# Patient Record
Sex: Female | Born: 1935 | Race: White | Hispanic: No | Smoking: Former smoker
Health system: Southern US, Community
[De-identification: ages and names within clinical notes are randomized; demographics above are authoritative.]

## PROBLEM LIST (undated history)

## (undated) ENCOUNTER — Emergency Department (HOSPITAL_COMMUNITY): Disposition: A | Discharge status: Home / Self Care | Payer: Medicare Other

## (undated) DIAGNOSIS — K5790 Diverticulosis of intestine, part unspecified, without perforation or abscess without bleeding: Secondary | ICD-10-CM

## (undated) DIAGNOSIS — I5042 Chronic combined systolic (congestive) and diastolic (congestive) heart failure: Secondary | ICD-10-CM

## (undated) DIAGNOSIS — N200 Calculus of kidney: Secondary | ICD-10-CM

## (undated) DIAGNOSIS — J449 Chronic obstructive pulmonary disease, unspecified: Secondary | ICD-10-CM

## (undated) DIAGNOSIS — I2510 Atherosclerotic heart disease of native coronary artery without angina pectoris: Secondary | ICD-10-CM

## (undated) DIAGNOSIS — L989 Disorder of the skin and subcutaneous tissue, unspecified: Secondary | ICD-10-CM

## (undated) DIAGNOSIS — E785 Hyperlipidemia, unspecified: Secondary | ICD-10-CM

## (undated) DIAGNOSIS — D510 Vitamin B12 deficiency anemia due to intrinsic factor deficiency: Secondary | ICD-10-CM

## (undated) DIAGNOSIS — Z9981 Dependence on supplemental oxygen: Secondary | ICD-10-CM

## (undated) DIAGNOSIS — I350 Nonrheumatic aortic (valve) stenosis: Secondary | ICD-10-CM

## (undated) DIAGNOSIS — Z860100 Personal history of colon polyps, unspecified: Secondary | ICD-10-CM

## (undated) DIAGNOSIS — E539 Vitamin B deficiency, unspecified: Secondary | ICD-10-CM

## (undated) DIAGNOSIS — G8929 Other chronic pain: Secondary | ICD-10-CM

## (undated) DIAGNOSIS — T8859XA Other complications of anesthesia, initial encounter: Secondary | ICD-10-CM

## (undated) DIAGNOSIS — M419 Scoliosis, unspecified: Secondary | ICD-10-CM

## (undated) DIAGNOSIS — Z8719 Personal history of other diseases of the digestive system: Secondary | ICD-10-CM

## (undated) DIAGNOSIS — J189 Pneumonia, unspecified organism: Secondary | ICD-10-CM

## (undated) DIAGNOSIS — Z9289 Personal history of other medical treatment: Secondary | ICD-10-CM

## (undated) DIAGNOSIS — R42 Dizziness and giddiness: Secondary | ICD-10-CM

## (undated) DIAGNOSIS — J42 Unspecified chronic bronchitis: Secondary | ICD-10-CM

## (undated) DIAGNOSIS — K219 Gastro-esophageal reflux disease without esophagitis: Secondary | ICD-10-CM

## (undated) DIAGNOSIS — L409 Psoriasis, unspecified: Secondary | ICD-10-CM

## (undated) DIAGNOSIS — M549 Dorsalgia, unspecified: Secondary | ICD-10-CM

## (undated) DIAGNOSIS — T4145XA Adverse effect of unspecified anesthetic, initial encounter: Secondary | ICD-10-CM

## (undated) DIAGNOSIS — C160 Malignant neoplasm of cardia: Secondary | ICD-10-CM

## (undated) DIAGNOSIS — K274 Chronic or unspecified peptic ulcer, site unspecified, with hemorrhage: Secondary | ICD-10-CM

## (undated) DIAGNOSIS — D519 Vitamin B12 deficiency anemia, unspecified: Secondary | ICD-10-CM

## (undated) DIAGNOSIS — M797 Fibromyalgia: Secondary | ICD-10-CM

## (undated) DIAGNOSIS — M1990 Unspecified osteoarthritis, unspecified site: Secondary | ICD-10-CM

## (undated) DIAGNOSIS — I10 Essential (primary) hypertension: Secondary | ICD-10-CM

## (undated) DIAGNOSIS — I272 Pulmonary hypertension, unspecified: Secondary | ICD-10-CM

## (undated) DIAGNOSIS — S42302A Unspecified fracture of shaft of humerus, left arm, initial encounter for closed fracture: Secondary | ICD-10-CM

## (undated) DIAGNOSIS — D509 Iron deficiency anemia, unspecified: Secondary | ICD-10-CM

## (undated) DIAGNOSIS — B029 Zoster without complications: Secondary | ICD-10-CM

## (undated) HISTORY — PX: CATARACT EXTRACTION W/ INTRAOCULAR LENS  IMPLANT, BILATERAL: SHX1307

## (undated) HISTORY — DX: Fibromyalgia: M79.7

## (undated) HISTORY — DX: Chronic or unspecified peptic ulcer, site unspecified, with hemorrhage: K27.4

## (undated) HISTORY — PX: TONSILLECTOMY: SUR1361

## (undated) HISTORY — DX: Disorder of the skin and subcutaneous tissue, unspecified: L98.9

## (undated) HISTORY — DX: Atherosclerotic heart disease of native coronary artery without angina pectoris: I25.10

## (undated) HISTORY — DX: Chronic combined systolic (congestive) and diastolic (congestive) heart failure: I50.42

## (undated) HISTORY — PX: ESOPHAGOGASTRODUODENOSCOPY: SHX1529

## (undated) HISTORY — DX: Vitamin B12 deficiency anemia due to intrinsic factor deficiency: D51.0

## (undated) HISTORY — PX: BACK SURGERY: SHX140

## (undated) HISTORY — DX: Pulmonary hypertension, unspecified: I27.20

## (undated) HISTORY — DX: Hyperlipidemia, unspecified: E78.5

## (undated) HISTORY — PX: FIXATION KYPHOPLASTY THORACIC SPINE: SHX1643

## (undated) HISTORY — DX: Essential (primary) hypertension: I10

## (undated) HISTORY — PX: JOINT REPLACEMENT: SHX530

## (undated) HISTORY — DX: Nonrheumatic aortic (valve) stenosis: I35.0

## (undated) HISTORY — DX: Malignant neoplasm of cardia: C16.0

## (undated) HISTORY — PX: COLONOSCOPY: SHX174

## (undated) HISTORY — DX: Unspecified osteoarthritis, unspecified site: M19.90

## (undated) HISTORY — PX: DILATION AND CURETTAGE OF UTERUS: SHX78

---

## 1973-12-06 HISTORY — PX: APPENDECTOMY: SHX54

## 1973-12-06 HISTORY — PX: TUBAL LIGATION: SHX77

## 1999-02-26 ENCOUNTER — Other Ambulatory Visit: Admission: RE | Admit: 1999-02-26 | Discharge: 1999-02-26 | Payer: Self-pay | Admitting: Obstetrics and Gynecology

## 1999-12-07 HISTORY — PX: TOTAL HIP ARTHROPLASTY: SHX124

## 2002-04-11 ENCOUNTER — Ambulatory Visit (HOSPITAL_COMMUNITY): Admission: RE | Admit: 2002-04-11 | Discharge: 2002-04-11 | Payer: Self-pay | Admitting: Gastroenterology

## 2002-05-23 ENCOUNTER — Encounter (HOSPITAL_COMMUNITY): Admission: RE | Admit: 2002-05-23 | Discharge: 2002-08-21 | Payer: Self-pay | Admitting: Family Medicine

## 2005-03-26 ENCOUNTER — Ambulatory Visit (HOSPITAL_COMMUNITY): Admission: RE | Admit: 2005-03-26 | Discharge: 2005-03-26 | Payer: Self-pay | Admitting: Gastroenterology

## 2005-07-06 ENCOUNTER — Encounter (HOSPITAL_COMMUNITY): Admission: RE | Admit: 2005-07-06 | Discharge: 2005-10-04 | Payer: Self-pay | Admitting: Family Medicine

## 2005-09-28 ENCOUNTER — Ambulatory Visit: Payer: Self-pay | Admitting: Hematology and Oncology

## 2005-10-08 ENCOUNTER — Other Ambulatory Visit: Admission: RE | Admit: 2005-10-08 | Discharge: 2005-10-08 | Payer: Self-pay | Admitting: Hematology and Oncology

## 2005-10-08 ENCOUNTER — Encounter (INDEPENDENT_AMBULATORY_CARE_PROVIDER_SITE_OTHER): Payer: Self-pay | Admitting: *Deleted

## 2005-10-08 ENCOUNTER — Encounter: Payer: Self-pay | Admitting: Hematology and Oncology

## 2005-12-17 ENCOUNTER — Encounter: Admission: RE | Admit: 2005-12-17 | Discharge: 2005-12-17 | Payer: Self-pay | Admitting: Family Medicine

## 2005-12-20 ENCOUNTER — Emergency Department (HOSPITAL_COMMUNITY): Admission: EM | Admit: 2005-12-20 | Discharge: 2005-12-21 | Payer: Self-pay | Admitting: Emergency Medicine

## 2006-06-01 ENCOUNTER — Ambulatory Visit: Payer: Self-pay | Admitting: Hematology and Oncology

## 2006-06-01 LAB — CBC WITH DIFFERENTIAL/PLATELET
BASO%: 0.8 % (ref 0.0–2.0)
EOS%: 1 % (ref 0.0–7.0)
HCT: 25.3 % — ABNORMAL LOW (ref 34.8–46.6)
LYMPH%: 19.3 % (ref 14.0–48.0)
MCH: 26.3 pg (ref 26.0–34.0)
MCHC: 32.4 g/dL (ref 32.0–36.0)
MONO%: 10.4 % (ref 0.0–13.0)
NEUT%: 68.5 % (ref 39.6–76.8)
Platelets: 741 10*3/uL — ABNORMAL HIGH (ref 145–400)

## 2006-06-06 LAB — IRON AND TIBC
%SAT: 12 % — ABNORMAL LOW (ref 20–55)
Iron: 30 ug/dL — ABNORMAL LOW (ref 42–145)
TIBC: 253 ug/dL (ref 250–470)

## 2006-06-06 LAB — VON WILLEBRAND FACTOR MULTIMER

## 2006-06-06 LAB — COMPREHENSIVE METABOLIC PANEL
ALT: <8 U/L (ref 0–40)
AST: 11 U/L (ref 0–37)
Creatinine, Ser: 1.21 mg/dL — ABNORMAL HIGH (ref 0.40–1.20)
Total Bilirubin: 0.3 mg/dL (ref 0.3–1.2)

## 2006-06-13 ENCOUNTER — Encounter (HOSPITAL_COMMUNITY): Admission: RE | Admit: 2006-06-13 | Discharge: 2006-09-11 | Payer: Self-pay | Admitting: Hematology and Oncology

## 2006-07-05 LAB — COMPREHENSIVE METABOLIC PANEL
AST: 11 U/L (ref 0–37)
BUN: 19 mg/dL (ref 6–23)
CO2: 24 mEq/L (ref 19–32)
Calcium: 9.6 mg/dL (ref 8.4–10.5)
Chloride: 108 mEq/L (ref 96–112)
Creatinine, Ser: 1.05 mg/dL (ref 0.40–1.20)
Total Bilirubin: 0.3 mg/dL (ref 0.3–1.2)

## 2006-07-05 LAB — CBC WITH DIFFERENTIAL/PLATELET
Basophils Absolute: 0.1 10*3/uL (ref 0.0–0.1)
EOS%: 2.9 % (ref 0.0–7.0)
HCT: 27 % — ABNORMAL LOW (ref 34.8–46.6)
HGB: 9 g/dL — ABNORMAL LOW (ref 11.6–15.9)
LYMPH%: 24.6 % (ref 14.0–48.0)
MCH: 27.8 pg (ref 26.0–34.0)
NEUT%: 61.1 % (ref 39.6–76.8)
Platelets: 448 10*3/uL — ABNORMAL HIGH (ref 145–400)
lymph#: 2 10*3/uL (ref 0.9–3.3)

## 2006-07-05 LAB — IRON AND TIBC
%SAT: 22 % (ref 20–55)
Iron: 54 ug/dL (ref 42–145)
TIBC: 241 ug/dL — ABNORMAL LOW (ref 250–470)

## 2006-07-07 LAB — TYPE & CROSSMATCH - CHCC

## 2006-08-09 ENCOUNTER — Ambulatory Visit: Payer: Self-pay | Admitting: Hematology and Oncology

## 2006-08-11 LAB — CBC WITH DIFFERENTIAL/PLATELET
Eosinophils Absolute: 0.2 10*3/uL (ref 0.0–0.5)
HGB: 10.5 g/dL — ABNORMAL LOW (ref 11.6–15.9)
MONO#: 0.7 10*3/uL (ref 0.1–0.9)
NEUT#: 5.7 10*3/uL (ref 1.5–6.5)
RBC: 3.66 10*6/uL — ABNORMAL LOW (ref 3.70–5.32)
RDW: 20.1 % — ABNORMAL HIGH (ref 11.3–14.5)
WBC: 8.3 10*3/uL (ref 3.9–10.0)

## 2006-08-11 LAB — HOLD TUBE, BLOOD BANK

## 2006-08-16 LAB — CBC WITH DIFFERENTIAL/PLATELET
BASO%: 0.8 % (ref 0.0–2.0)
Basophils Absolute: 0.1 10*3/uL (ref 0.0–0.1)
EOS%: 3 % (ref 0.0–7.0)
Eosinophils Absolute: 0.2 10*3/uL (ref 0.0–0.5)
HCT: 30.4 % — ABNORMAL LOW (ref 34.8–46.6)
HGB: 10.1 g/dL — ABNORMAL LOW (ref 11.6–15.9)
LYMPH%: 21.4 % (ref 14.0–48.0)
MCH: 28.3 pg (ref 26.0–34.0)
MCHC: 33.4 g/dL (ref 32.0–36.0)
MCV: 84.9 fL (ref 81.0–101.0)
MONO#: 0.9 10*3/uL (ref 0.1–0.9)
MONO%: 11.3 % (ref 0.0–13.0)
NEUT#: 4.9 10*3/uL (ref 1.5–6.5)
NEUT%: 63.5 % (ref 39.6–76.8)
Platelets: 435 10*3/uL — ABNORMAL HIGH (ref 145–400)
RBC: 3.58 10*6/uL — ABNORMAL LOW (ref 3.70–5.32)
RDW: 19 % — ABNORMAL HIGH (ref 11.3–14.5)
WBC: 7.7 10*3/uL (ref 3.9–10.0)
lymph#: 1.6 10*3/uL (ref 0.9–3.3)

## 2006-08-16 LAB — COMPREHENSIVE METABOLIC PANEL WITH GFR
ALT: 8 U/L (ref 0–40)
AST: 9 U/L (ref 0–37)
Albumin: 4 g/dL (ref 3.5–5.2)
Alkaline Phosphatase: 51 U/L (ref 39–117)
BUN: 23 mg/dL (ref 6–23)
CO2: 26 meq/L (ref 19–32)
Calcium: 10.3 mg/dL (ref 8.4–10.5)
Chloride: 107 meq/L (ref 96–112)
Creatinine, Ser: 1.35 mg/dL — ABNORMAL HIGH (ref 0.40–1.20)
Glucose, Bld: 97 mg/dL (ref 70–99)
Potassium: 4.2 meq/L (ref 3.5–5.3)
Sodium: 139 meq/L (ref 135–145)
Total Bilirubin: 0.2 mg/dL — ABNORMAL LOW (ref 0.3–1.2)
Total Protein: 7 g/dL (ref 6.0–8.3)

## 2006-08-16 LAB — HAPTOGLOBIN: Haptoglobin: 225 mg/dL — ABNORMAL HIGH (ref 16–200)

## 2006-08-16 LAB — HOLD TUBE, BLOOD BANK

## 2006-09-16 ENCOUNTER — Encounter: Admission: RE | Admit: 2006-09-16 | Discharge: 2006-09-16 | Payer: Self-pay | Admitting: Family Medicine

## 2006-09-19 ENCOUNTER — Observation Stay (HOSPITAL_COMMUNITY): Admission: AD | Admit: 2006-09-19 | Discharge: 2006-09-21 | Payer: Self-pay | Admitting: Internal Medicine

## 2006-09-20 ENCOUNTER — Ambulatory Visit: Payer: Self-pay | Admitting: Internal Medicine

## 2007-06-14 ENCOUNTER — Encounter (HOSPITAL_COMMUNITY): Admission: RE | Admit: 2007-06-14 | Discharge: 2007-08-10 | Payer: Self-pay | Admitting: Family Medicine

## 2007-07-04 ENCOUNTER — Encounter (HOSPITAL_COMMUNITY): Admission: RE | Admit: 2007-07-04 | Discharge: 2007-09-04 | Payer: Self-pay | Admitting: Family Medicine

## 2007-07-05 ENCOUNTER — Ambulatory Visit (HOSPITAL_COMMUNITY): Admission: RE | Admit: 2007-07-05 | Discharge: 2007-07-05 | Payer: Self-pay | Admitting: Gastroenterology

## 2008-02-29 ENCOUNTER — Encounter (HOSPITAL_COMMUNITY): Admission: RE | Admit: 2008-02-29 | Discharge: 2008-05-29 | Payer: Self-pay | Admitting: Family Medicine

## 2008-06-27 ENCOUNTER — Encounter (HOSPITAL_COMMUNITY): Admission: RE | Admit: 2008-06-27 | Discharge: 2008-08-15 | Payer: Self-pay | Admitting: Family Medicine

## 2008-07-31 ENCOUNTER — Ambulatory Visit: Payer: Self-pay | Admitting: Hematology and Oncology

## 2008-08-02 LAB — CBC & DIFF AND RETIC
Basophils Absolute: 0.1 10*3/uL (ref 0.0–0.1)
Eosinophils Absolute: 0.1 10*3/uL (ref 0.0–0.5)
HGB: 9.2 g/dL — ABNORMAL LOW (ref 11.6–15.9)
IRF: 0.5 — ABNORMAL HIGH (ref 0.130–0.330)
MONO#: 0.9 10*3/uL (ref 0.1–0.9)
NEUT#: 5.8 10*3/uL (ref 1.5–6.5)
RBC: 3.16 10*6/uL — ABNORMAL LOW (ref 3.70–5.32)
RDW: 17.8 % — ABNORMAL HIGH (ref 11.3–14.5)
RETIC #: 120.4 10*3/uL — ABNORMAL HIGH (ref 19.7–115.1)
Retic %: 3.8 % — ABNORMAL HIGH (ref 0.4–2.3)
WBC: 8.8 10*3/uL (ref 3.9–10.0)

## 2008-08-02 LAB — URINALYSIS, MICROSCOPIC - CHCC
Bilirubin (Urine): NEGATIVE
Ketones: NEGATIVE mg/dL
Protein: NEGATIVE mg/dL
RBC count: NEGATIVE (ref 0–2)
Specific Gravity, Urine: 1.005 (ref 1.003–1.035)
WBC, UA: NEGATIVE (ref 0–2)
pH: 6 (ref 4.6–8.0)

## 2008-08-06 LAB — GLUCOSE 6 PHOSPHATE DEHYDROGENASE: G-6-PD, Quant: 22 U/g{Hb} — ABNORMAL HIGH (ref 7–20)

## 2008-08-06 LAB — COMPREHENSIVE METABOLIC PANEL
ALT: <8 U/L (ref 0–35)
AST: 9 U/L (ref 0–37)
Albumin: 4.4 g/dL (ref 3.5–5.2)
Alkaline Phosphatase: 42 U/L (ref 39–117)
BUN: 18 mg/dL (ref 6–23)
Chloride: 105 mEq/L (ref 96–112)
Potassium: 4.3 mEq/L (ref 3.5–5.3)
Sodium: 138 mEq/L (ref 135–145)
Total Protein: 7 g/dL (ref 6.0–8.3)

## 2008-08-06 LAB — DIRECT ANTIGLOBULIN TEST (NOT AT ARMC)
DAT (Complement): NEGATIVE
DAT IgG: NEGATIVE

## 2008-08-06 LAB — HAPTOGLOBIN: Haptoglobin: 166 mg/dL (ref 16–200)

## 2008-08-06 LAB — PROTEIN ELECTROPHORESIS, SERUM, WITH REFLEX: Total Protein, Serum Electrophoresis: 7 g/dL (ref 6.0–8.3)

## 2008-08-06 LAB — IRON AND TIBC: UIBC: 227 ug/dL

## 2008-08-08 ENCOUNTER — Encounter (HOSPITAL_COMMUNITY): Admission: RE | Admit: 2008-08-08 | Discharge: 2008-08-26 | Payer: Self-pay | Admitting: Hematology and Oncology

## 2008-08-08 LAB — TYPE & CROSSMATCH - CHCC

## 2008-08-09 ENCOUNTER — Ambulatory Visit (HOSPITAL_COMMUNITY): Admission: RE | Admit: 2008-08-09 | Discharge: 2008-08-09 | Payer: Self-pay | Admitting: Hematology and Oncology

## 2008-08-09 LAB — RHEUMATOID FACTOR: Rhuematoid fact SerPl-aCnc: <20 IU/mL (ref 0–20)

## 2008-08-14 ENCOUNTER — Ambulatory Visit (HOSPITAL_COMMUNITY): Admission: RE | Admit: 2008-08-14 | Discharge: 2008-08-14 | Payer: Self-pay | Admitting: Hematology and Oncology

## 2008-10-11 ENCOUNTER — Encounter (HOSPITAL_COMMUNITY): Admission: RE | Admit: 2008-10-11 | Discharge: 2008-12-05 | Payer: Self-pay | Admitting: Family Medicine

## 2009-07-28 ENCOUNTER — Encounter (HOSPITAL_COMMUNITY): Admission: RE | Admit: 2009-07-28 | Discharge: 2009-09-04 | Payer: Self-pay | Admitting: Family Medicine

## 2009-09-05 ENCOUNTER — Ambulatory Visit (HOSPITAL_COMMUNITY): Admission: RE | Admit: 2009-09-05 | Discharge: 2009-09-05 | Payer: Self-pay | Admitting: Family Medicine

## 2010-12-26 ENCOUNTER — Encounter: Payer: Self-pay | Admitting: Family Medicine

## 2010-12-27 ENCOUNTER — Encounter: Payer: Self-pay | Admitting: Family Medicine

## 2011-04-20 NOTE — Op Note (Signed)
NAMECAELAN, ATCHLEY             ACCOUNT NO.:  [id number]   MEDICAL RECORD NO.:  [id number]          PATIENT TYPE:  AMB   LOCATION:  ENDO                         FACILITY:  [facility]   PHYSICIAN:  John C. Madilyn Fireman, M.D.    DATE OF BIRTH:  [birth year]   DATE OF PROCEDURE:  07/09/2007  DATE OF DISCHARGE:  07/05/2007                               OPERATIVE REPORT   PROCEDURE:  Small bowel capsule endoscopy.   INDICATIONS FOR PROCEDURE:  Gastrointestinal bleeding of obscure origin.   PROCEDURE DATA:  Gastric passage time 19 minutes.  Small bowel passage  time 3 hours and 12 minutes.   RESULTS:  Normal small intestine with no active bleeding.   IMPRESSION:  Normal study.   PLAN:  Will review previous workup and monitor stools and hemoglobin for  further bleeding.           ______________________________  Everardo All. Madilyn Fireman, M.D.     JCH/MEDQ  D:  07/09/2007  T:  07/10/2007  Job:  [id number]

## 2011-04-23 NOTE — H&P (Signed)
NAMEPAULINE, [name]             ACCOUNT NO.:  [id number]   MEDICAL RECORD NO.:  [id number]          PATIENT TYPE:  INP   LOCATION:  1331                         FACILITY:  [facility]   PHYSICIAN:  Corinna L. Lendell Caprice, MDDATE OF BIRTH:  October 28, 1934   DATE OF ADMISSION:  09/19/2006  DATE OF DISCHARGE:                                HISTORY & PHYSICAL   CHIEF COMPLAINT:  Abscess.   HISTORY OF PRESENT ILLNESS:  [name] is a pleasant 77 year old white  female patient of Dr. Foy Guadalajara who was directly admitted with recurrent  abscesses of the left leg and also possible abscess of the left buttock.  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 Foy Guadalajara. This is  from multiple Talwin injections in the past.  Since then, she has been  battling recurrent cellulitis and abscesses.  She has been on and off  multiple antibiotics over the past year.  She had one of the abscesses  incised and drained which reportedly showed staph aureus which was  pansensitive. I do not have the actual microbiology report, but I do have  the office notes. She reports that about a week ago she had a week's worth  of fevers and left buttock pain.  Dr. Foy Guadalajara has been managing this as an  outpatient and apparently has discussed the case with infectious disease  several times. She has been on Cipro, rifampin, Keflex, azithromycin,  Bactroban, Duricef over the past year. She has had multiple I&D's of  abscesses of her left leg over the past year. She has had problems with the  right leg in the past as well, but this has not been as much of an issue.  Apparently an MRI was done under the advice of Dr. Burnice Logan to evaluate  the extent of the infection. This was done of the pelvis and thighs with and  without contrast on September 16, 2006, to [facility]. There was  question of a possible abscess measuring 7.8 x 2.9 cm of the lower left  buttock, and patient is being admitted for further workup,  infectious  disease and surgical consultation. The patient currently has five draining  areas of her left thigh. She reports that they had been draining pus, but  currently they are draining mainly serous fluid and sometimes calcium. She  is currently taking Keflex. Apparently whenever antibiotics were stopped,  the abscesses and cellulitis soon become worse.   PAST MEDICAL HISTORY:  In addition to above, include hypertension,  arrhythmia for which she takes atenolol, fibromyalgia, iron deficiency  anemia. She apparently has had a negative workup, and she reports frequent  stomach bleeding on antibiotics and mild COPD.   MEDICATIONS:  1. Keflex, she believes 500 mg p.o. b.i.d.  2. Atenolol 50 mg p.o. b.i.d.  3. Flexeril 10 mg p.o. nightly.  4. Darvocet at 5:00 a.m. and 5:00 p.m.  5. Lisinopril 40 mg a day.  6. Norvasc 5 mg a day.  She is not sure whether or not this has been      stopped recently.  7. Prilosec 20 mg a day.  8. Prednisone 2 mg a day.  9. Lyrica 50 mg daily, although she sometimes skips this medication.  10.She also takes St. John's Wort.   SOCIAL HISTORY:  She quit smoking in 1999.  She is widowed.   FAMILY HISTORY:  Is noncontributory.   REVIEW OF SYSTEMS:  CONSTITUTIONAL: As above.  HEENT: No headaches, sore  throat.  RESPIRATORY: No cough or shortness of breath.  CARDIOVASCULAR: No  chest pains.  No recent palpitations.  GI: As above.  She had a colonoscopy  earlier today which reportedly showed a polyp but no significant bleeding.  Apparently she has had upper endoscopy before without any signs of bleeding.  HEMATOLOGIC: As above.  Apparently she has required IV iron and transfusions  over the past year. ENDOCRINE: No diabetes.  PSYCHIATRIC: She does have a  history of depression.  NEUROLOGIC:  No history of stroke or seizure.  SKIN:  As above. GU: No hematuria or dysuria.  MUSCULOSKELETAL:  As above.   PHYSICAL EXAMINATION:  VITAL SIGNS: Her temperature is  98.6, pulse 116,  respiratory rate 18, blood pressure 119/78, oxygen saturation 97% on room  air.  GENERAL: The patient is nontoxic appearing, in no acute distress.  HEENT: Normocephalic, atraumatic.  Pupils equal, round, reactive to light.  Sclera nonicteric.  Moist mucous membranes.  NECK: Supple, no lymphadenopathy.  LUNGS: Clear to auscultation bilaterally without wheezes, rhonchi or rales.  CARDIOVASCULAR:  Regular rate and rhythm without murmurs, gallops or rubs.  ABDOMEN:  Normal bowel sounds, soft, nontender, nondistended.  GU/RECTAL:  Deferred.  EXTREMITIES:  Pulses are intact.  No clubbing, cyanosis.  SKIN:  She has multiple areas of nodular, hard, irregular subcutaneous  tissue over both legs.  There is no draining area on the right leg.  She has  several nodules of her left leg which are draining yellow fluid, and she has  the same hard and calcific subcutaneous tissue over the left thigh  anteriorly and posteriorly with some surrounding erythema of the left  anterior medial thigh area.  She has significant hardened areas over her  both buttocks with some redness over the left buttock.  I do not feel a  definite fluctuant area over the left buttock. There is no draining area  over the block.   An MRI done September 16, 2006, shows extensive calcifications throughout the  subcutaneous fat of both buttocks and thighs and edema within the  subcutaneous fat of the left buttock at the level of the proximal left  femoral diatheses and a peripherally enhancing fluid collection in the  subcutaneous fat which could reflect a superficial abscess measuring 7.8 cm  cephalocaudad up to 2.9 cm transverse. No significant edema or abnormal  enhancement within the musculature of the buttocks or proximal thighs, some  muscle atrophy, worse on the right. Extensive subcutaneous calcifications of the subcutaneous fat of both thighs without involvement of the musculature  and no fluid  collections.   ASSESSMENT/PLAN:  1. Recurrent cellulitis and abscesses of thigh and buttock with possible      large abscess involving the left buttock. The patient will be admitted.      I will continue the Keflex for now and consult infectious disease and      surgery for further recommendations.  2. Hypertension.  3. Fibromyalgia.  4. Stable chronic obstructive pulmonary disease.  5. History of arrhythmia.  6. History of iron deficiency anemia with a negative workup.   Total time including reviewing the records from  Dr. Pablo Lawrence office and  radiology reports is 60 minutes.      Corinna L. Lendell Caprice, MD  Electronically Signed     CLS/MEDQ  D:  09/19/2006  T:  09/20/2006  Job:  [id number]   cc:   Molly Maduro L. Foy Guadalajara, M.D.  Fax: [phone]

## 2011-04-23 NOTE — Procedures (Signed)
[facility]  Patient:    [name], BERREY Visit Number: [id number] MRN: [id number]          Service Type: END Location: ENDO Attending Physician:  Louie Bun Dictated by:   Everardo All Madilyn Fireman, M.D. Proc. Date: 04/11/02 Admit Date:  04/11/2002   CC:         Marinda Elk, M.D.   Procedure Report  PROCEDURE:  Colonoscopy.  INDICATION FOR PROCEDURE:  Iron deficiency anemia.  DESCRIPTION OF PROCEDURE:  The patient was placed in the left lateral decubitus position and placed on the pulse monitor with continuous low flow oxygen delivered via nasal cannula. She was sedated with 125 mg IV Demerol and 10 mcg IV Fentanyl. The Olympus video colonoscope was inserted into the rectum and advanced to the cecum, confirmed by transillumination of McBurneys point and visualization of the ileocecal valve and appendiceal orifice. The prep was excellent. The cecum, ascending, transverse and descending colon all appeared normal with no masses, polyps, diverticula or other mucosal abnormalities. In the sigmoid colon, there were seen a few scattered diverticula and no other abnormalities. The rectum appeared normal down the anus and retroflex view revealed no obviously enlarged internal hemorrhoids. The colonoscope was then withdrawn and the patient returned to the recovery room in stable condition. She tolerated the procedure well and there were no immediate complications.  IMPRESSION:  Scatter left-sided diverticulosis; otherwise normal colonoscopy.  PLAN:  Will proceed with EGD. Dictated by:   Everardo All Madilyn Fireman, M.D. Attending Physician:  Louie Bun DD:  04/11/02 TD:  04/11/02 Job: [id number]

## 2011-04-23 NOTE — Procedures (Signed)
[facility]  Patient:    [name], [name] Visit Number: [id number] MRN: [id number]          Service Type: END Location: ENDO Attending Physician:  Louie Bun Dictated by:   Everardo All Madilyn Fireman, M.D. Proc. Date: 04/11/02 Admit Date:  04/11/2002   CC:         Ranae Plumber, M.D.   Procedure Report  PROCEDURE:  Esophagogastroduodenoscopy with biopsy.  INDICATIONS FOR PROCEDURE:  Iron deficiency anemia with no obvious colonoscopy.  DESCRIPTION OF PROCEDURE:  The patient was placed in the left lateral decubitus position then placed on the pulse monitor with continuous low flow oxygen delivered by nasal cannula. She was sedated with 20 mg IV Demerol in addition the medicine received for the previous colonoscopy. The Olympus video endoscope was advanced under direct vision into the oropharynx and esophagus. The esophagus was somewhat tortuous but of normal caliber with the squamocolumnar line at 32 cm above an approximately 6 cm hiatal hernia. There was no visible esophagitis and no obvious ring or stricture. The stomach was entered and a small amount of liquid secretions were suctioned from the fundus. Retroflexed view of the cardia confirmed the large hiatal hernia and it was otherwise unremarkable. The fundus and body appeared normal. The antrum showed some streaks of erythema with a few small focal raised areas with no ulceration or exudate consistent with a moderate antral gastritis. A CLOtest was obtained. The pylorus was nondeformed and easily allowed passage of the endoscope tip into the duodenum. Both the bulb and second portion were well inspected and appeared to be within normal limits. The scope was then withdrawn and the patient returned to the recovery room in stable condition. The patient tolerated the procedure well and there were no immediate complications.  IMPRESSION:  1. Antral gastritis.  2. Large hiatal hernia.  PLAN:   Await CLOtest and treat for eradication of Helicobacter if positive, otherwise, suspect large hiatal hernia is associated with her iron deficiency anemia and will treat it with oral iron supplementation. Dictated by:   Everardo All Madilyn Fireman, M.D. Attending Physician:  Louie Bun DD:  04/11/02 TD:  04/11/02 Job: [id number]

## 2011-04-23 NOTE — Consult Note (Signed)
[name], [name]             ACCOUNT NO.:  [id number]   MEDICAL RECORD NO.:  [id number]          PATIENT TYPE:  INP   LOCATION:  1331                         FACILITY:  [facility]   PHYSICIAN:  [name], M.D.DATE OF BIRTH:  01/01/1934   DATE OF CONSULTATION:  09/20/2006  DATE OF DISCHARGE:                                   CONSULTATION   REASON FOR CONSULTATION:  Evaluate for left gluteal fluid collection.   HISTORY OF PRESENT ILLNESS:  This is a 77 year old white female who has a  history of subcutaneous Talwin injections in the remote past.  She has been  followed for a long time by [name] for chronic subcutaneous  calcifications and nodules of the lower extremities and the gluteal areas.  She has grown methicillin-sensitive staph aureus in the past and has  undergone numerous minor incision and drainages in the past.   She was admitted to the hospital yesterday for evaluation of possible left  gluteal abscess seen on MRI scan.  The patient is alert, has had no fever or  chills, simply complains of chronic pain and intermittent drainage and hard  lumps in her left thigh and sometimes the left gluteal area.   MRI was done which shows a 7 cm x 2 cm enhancing fluid collection in this  superficial left gluteal area posterior and inferior to the left greater  trochanter.  Radiologist were unsure whether this was a chronic hematoma or  abscess.  MRI of the lower extremity shows no active cellulitis or abscess  in the thighs.   PAST HISTORY:  1. Talwin injections.  2. Methicillin sensitive staph aureus subcutaneous abscesses.  3. Fibromyalgia.  4. Hypertension.  5. Anemia.   CURRENT MEDICATIONS:  Protonix.  Prednisone 2 mg a day. Keflex.  Lyrica.  Tenormin.  Flexeril.  Tylenol.   ALLERGIES:  SULFA.  CEPHALOSPORINS.  VICODIN.  OXYCODONE.  MORPHINE.  BIAXIN.   FAMILY HISTORY:  Noncontributory.   REVIEW OF SYSTEMS:  All systems reviewed are noncontributory except  as  described above.   PHYSICAL EXAM:  Pleasant older woman who is alert and in no distress.  Temperature 98.3, blood pressure 104/56, heart rate 105, respiratory rate  15.  LUNGS:  Clear to auscultation.  No chest wall tenderness.  HEART:  Regular rate and rhythm.  No murmur.  Radial, femoral pulses are  palpable.  ABDOMEN:  Soft and nontender.  EXTREMITIES:  In the left gluteal area, there are extensive chronic  subcutaneous nodules which are firm but not particularly tender.  There is a  little bit of chronic skin discoloration.  There is no classic abscess.  The  area in question below the greater trochanter is not very clear.  There is  certainly no fluctuant mass here.  There may be some softening of the  tissues in some areas but that is not well localized.  There are also  multiple nodules of the left thigh, especially anteriorly and a couple of  areas of chronic drainage but no abscesses at this time.   ADMISSION DATA:  MRI is described above.  Hemoglobin  10.0, white blood cell  count 8300.  Basic metabolic panel normal.  INR is 1.1.  Calcium is 10.5.   ASSESSMENT:  1. Chronic subcutaneous calcifications and chronic sterile abscesses of      left thigh, no active abscess at this time by MRI of the thighs.  2. Enhancing fluid collection the left gluteal area.  Question whether      this is a sterile hematoma or infected fluid collection.  Physical exam      is not very clear.   PLAN:  1. I agree with infectious disease consult.  2. I would like to avoid making a major incision on her left gluteal area      because I think it would heal will very poorly.  3. For that reason, I am going to send her to the radiology department for      ultrasound-guided aspiration and drainage of the left gluteal fluid      collection.  We will send this for Gram's stain and culture and      hopefully will find out whether more extensive surgery will be      necessary.  Hopefully this will  resolve with percutaneous drainage and      antibiotics.      [name], M.D.  Electronically Signed     HMI/MEDQ  D:  09/20/2006  T:  09/21/2006  Job:  [id number]   cc:   [name], M.D.  Fax: [phone]   Yolanda L. Lendell Caprice, MD

## 2011-04-29 ENCOUNTER — Encounter (HOSPITAL_COMMUNITY): Payer: Medicare Other | Attending: Family Medicine

## 2011-04-29 DIAGNOSIS — D649 Anemia, unspecified: Secondary | ICD-10-CM | POA: Insufficient documentation

## 2011-05-12 ENCOUNTER — Encounter (HOSPITAL_COMMUNITY): Payer: Medicare Other | Attending: Family Medicine

## 2011-05-12 DIAGNOSIS — D649 Anemia, unspecified: Secondary | ICD-10-CM | POA: Insufficient documentation

## 2011-07-19 ENCOUNTER — Other Ambulatory Visit: Payer: Self-pay | Admitting: Gastroenterology

## 2011-07-19 DIAGNOSIS — R1314 Dysphagia, pharyngoesophageal phase: Secondary | ICD-10-CM

## 2011-07-22 ENCOUNTER — Ambulatory Visit
Admission: RE | Admit: 2011-07-22 | Discharge: 2011-07-22 | Disposition: A | Discharge status: Home / Self Care | Payer: Medicare Other | Source: Ambulatory Visit | Attending: Gastroenterology | Admitting: Gastroenterology

## 2011-07-22 DIAGNOSIS — R1314 Dysphagia, pharyngoesophageal phase: Secondary | ICD-10-CM

## 2011-07-27 ENCOUNTER — Other Ambulatory Visit: Payer: Self-pay | Admitting: Gastroenterology

## 2011-07-30 ENCOUNTER — Other Ambulatory Visit: Payer: Self-pay | Admitting: Gastroenterology

## 2011-07-30 DIAGNOSIS — C159 Malignant neoplasm of esophagus, unspecified: Secondary | ICD-10-CM

## 2011-08-04 ENCOUNTER — Ambulatory Visit
Admission: RE | Admit: 2011-08-04 | Discharge: 2011-08-04 | Disposition: A | Discharge status: Home / Self Care | Payer: Medicare Other | Source: Ambulatory Visit | Attending: Gastroenterology | Admitting: Gastroenterology

## 2011-08-04 DIAGNOSIS — C159 Malignant neoplasm of esophagus, unspecified: Secondary | ICD-10-CM

## 2011-08-04 MED ORDER — IOHEXOL 300 MG/ML  SOLN
100.0000 mL | Freq: Once | INTRAMUSCULAR | Status: AC | PRN
  Administered 2011-08-04: 100 mL via INTRAVENOUS

## 2011-08-12 ENCOUNTER — Encounter (HOSPITAL_BASED_OUTPATIENT_CLINIC_OR_DEPARTMENT_OTHER): Payer: Medicare Other | Admitting: Oncology

## 2011-08-12 DIAGNOSIS — C160 Malignant neoplasm of cardia: Secondary | ICD-10-CM

## 2011-08-12 DIAGNOSIS — D500 Iron deficiency anemia secondary to blood loss (chronic): Secondary | ICD-10-CM

## 2011-08-12 DIAGNOSIS — D510 Vitamin B12 deficiency anemia due to intrinsic factor deficiency: Secondary | ICD-10-CM

## 2011-08-12 DIAGNOSIS — M1990 Unspecified osteoarthritis, unspecified site: Secondary | ICD-10-CM

## 2011-08-17 ENCOUNTER — Other Ambulatory Visit: Payer: Self-pay | Admitting: Oncology

## 2011-08-17 ENCOUNTER — Encounter (HOSPITAL_BASED_OUTPATIENT_CLINIC_OR_DEPARTMENT_OTHER): Payer: Medicare Other | Admitting: Oncology

## 2011-08-17 DIAGNOSIS — D500 Iron deficiency anemia secondary to blood loss (chronic): Secondary | ICD-10-CM

## 2011-08-17 DIAGNOSIS — D649 Anemia, unspecified: Secondary | ICD-10-CM

## 2011-08-17 LAB — CBC WITH DIFFERENTIAL/PLATELET
BASO%: 0.8 % (ref 0.0–2.0)
Basophils Absolute: 0.1 10*3/uL (ref 0.0–0.1)
EOS%: 2.1 % (ref 0.0–7.0)
HCT: 34.7 % — ABNORMAL LOW (ref 34.8–46.6)
HGB: 11.6 g/dL (ref 11.6–15.9)
LYMPH%: 14.8 % (ref 14.0–49.7)
MCH: 28.7 pg (ref 25.1–34.0)
MCHC: 33.3 g/dL (ref 31.5–36.0)
MONO#: 0.8 10*3/uL (ref 0.1–0.9)
NEUT%: 73.5 % (ref 38.4–76.8)
Platelets: 378 10*3/uL (ref 145–400)
lymph#: 1.3 10*3/uL (ref 0.9–3.3)

## 2011-08-17 LAB — COMPREHENSIVE METABOLIC PANEL
ALT: <8 U/L (ref 0–35)
BUN: 19 mg/dL (ref 6–23)
CO2: 22 mEq/L (ref 19–32)
Calcium: 9.9 mg/dL (ref 8.4–10.5)
Chloride: 104 mEq/L (ref 96–112)
Creatinine, Ser: 1.06 mg/dL (ref 0.50–1.10)
Total Bilirubin: 0.3 mg/dL (ref 0.3–1.2)

## 2011-08-17 LAB — CEA: CEA: 0.9 ng/mL (ref 0.0–5.0)

## 2011-08-18 ENCOUNTER — Ambulatory Visit
Admission: RE | Admit: 2011-08-18 | Discharge: 2011-08-18 | Disposition: A | Discharge status: Home / Self Care | Payer: Medicare Other | Source: Ambulatory Visit | Attending: Radiation Oncology | Admitting: Radiation Oncology

## 2011-08-18 ENCOUNTER — Other Ambulatory Visit: Payer: Self-pay | Admitting: Radiation Oncology

## 2011-08-18 DIAGNOSIS — Z510 Encounter for antineoplastic radiation therapy: Secondary | ICD-10-CM | POA: Insufficient documentation

## 2011-08-18 DIAGNOSIS — R1013 Epigastric pain: Secondary | ICD-10-CM | POA: Insufficient documentation

## 2011-08-18 DIAGNOSIS — C160 Malignant neoplasm of cardia: Secondary | ICD-10-CM | POA: Insufficient documentation

## 2011-08-18 DIAGNOSIS — R1310 Dysphagia, unspecified: Secondary | ICD-10-CM | POA: Insufficient documentation

## 2011-08-18 DIAGNOSIS — K449 Diaphragmatic hernia without obstruction or gangrene: Secondary | ICD-10-CM | POA: Insufficient documentation

## 2011-08-18 DIAGNOSIS — Z79899 Other long term (current) drug therapy: Secondary | ICD-10-CM | POA: Insufficient documentation

## 2011-08-18 DIAGNOSIS — K209 Esophagitis, unspecified without bleeding: Secondary | ICD-10-CM | POA: Insufficient documentation

## 2011-08-18 DIAGNOSIS — C159 Malignant neoplasm of esophagus, unspecified: Secondary | ICD-10-CM

## 2011-08-18 DIAGNOSIS — R110 Nausea: Secondary | ICD-10-CM | POA: Insufficient documentation

## 2011-08-18 DIAGNOSIS — D649 Anemia, unspecified: Secondary | ICD-10-CM | POA: Insufficient documentation

## 2011-08-20 ENCOUNTER — Encounter (HOSPITAL_COMMUNITY)
Admission: RE | Admit: 2011-08-20 | Discharge: 2011-08-20 | Disposition: A | Discharge status: Home / Self Care | Payer: Medicare Other | Source: Ambulatory Visit | Attending: Radiation Oncology | Admitting: Radiation Oncology

## 2011-08-20 DIAGNOSIS — C159 Malignant neoplasm of esophagus, unspecified: Secondary | ICD-10-CM | POA: Insufficient documentation

## 2011-08-20 MED ORDER — FLUDEOXYGLUCOSE F - 18 (FDG) INJECTION
18.9000 | Freq: Once | INTRAVENOUS | Status: AC | PRN
  Administered 2011-08-20: 18.9 via INTRAVENOUS

## 2011-08-24 ENCOUNTER — Encounter (HOSPITAL_BASED_OUTPATIENT_CLINIC_OR_DEPARTMENT_OTHER): Payer: Medicare Other | Admitting: Oncology

## 2011-08-24 DIAGNOSIS — C160 Malignant neoplasm of cardia: Secondary | ICD-10-CM

## 2011-08-24 DIAGNOSIS — R634 Abnormal weight loss: Secondary | ICD-10-CM

## 2011-08-24 DIAGNOSIS — D500 Iron deficiency anemia secondary to blood loss (chronic): Secondary | ICD-10-CM

## 2011-08-24 DIAGNOSIS — R1319 Other dysphagia: Secondary | ICD-10-CM

## 2011-08-30 LAB — CROSSMATCH
ABO/RH(D): O NEG
Antibody Screen: NEGATIVE

## 2011-08-31 ENCOUNTER — Encounter (HOSPITAL_BASED_OUTPATIENT_CLINIC_OR_DEPARTMENT_OTHER): Payer: Medicare Other | Admitting: Oncology

## 2011-08-31 DIAGNOSIS — Z5111 Encounter for antineoplastic chemotherapy: Secondary | ICD-10-CM

## 2011-08-31 DIAGNOSIS — C160 Malignant neoplasm of cardia: Secondary | ICD-10-CM

## 2011-09-01 ENCOUNTER — Encounter: Payer: Self-pay | Admitting: Thoracic Surgery

## 2011-09-01 ENCOUNTER — Ambulatory Visit (INDEPENDENT_AMBULATORY_CARE_PROVIDER_SITE_OTHER): Payer: Medicare Other | Admitting: Thoracic Surgery

## 2011-09-01 VITALS — BP 147/63 | HR 76 | Resp 20 | Ht 63.0 in | Wt 158.0 lb

## 2011-09-01 DIAGNOSIS — K274 Chronic or unspecified peptic ulcer, site unspecified, with hemorrhage: Secondary | ICD-10-CM

## 2011-09-01 DIAGNOSIS — K284 Chronic or unspecified gastrojejunal ulcer with hemorrhage: Secondary | ICD-10-CM

## 2011-09-01 DIAGNOSIS — D510 Vitamin B12 deficiency anemia due to intrinsic factor deficiency: Secondary | ICD-10-CM

## 2011-09-01 DIAGNOSIS — L989 Disorder of the skin and subcutaneous tissue, unspecified: Secondary | ICD-10-CM

## 2011-09-01 DIAGNOSIS — Z9289 Personal history of other medical treatment: Secondary | ICD-10-CM

## 2011-09-01 DIAGNOSIS — C160 Malignant neoplasm of cardia: Secondary | ICD-10-CM

## 2011-09-01 DIAGNOSIS — M1990 Unspecified osteoarthritis, unspecified site: Secondary | ICD-10-CM | POA: Insufficient documentation

## 2011-09-01 DIAGNOSIS — M797 Fibromyalgia: Secondary | ICD-10-CM

## 2011-09-01 HISTORY — DX: Chronic or unspecified gastrojejunal ulcer with hemorrhage: K28.4

## 2011-09-01 HISTORY — DX: Chronic or unspecified peptic ulcer, site unspecified, with hemorrhage: K27.4

## 2011-09-01 NOTE — Progress Notes (Signed)
PCP is FRIED, Doris Cheadle, MD Referring Provider is Truett Perna, Perfecto Kingdom,*  Chief Complaint  Patient presents with  . Esophageal Cancer    Referral from Dr Truett Perna for consult on adenocarcinoma of esophagous    HPI: This 77 year old patient has a long history of peptic ulcer disease gastroesophageal reflux. She has a one-year history of solid food and liquid dysphagia. She has had previous esophageal dilatations. She underwent endoscopy that revealed a 2-3 cm fungating mass at the gastroesophageal junction. Biopsy revealed adenocarcinoma. She was seen by Dr. Truett Perna as well as the radiation oncologist and radiation and chemotherapy were started. She is referred here for surgical evaluation. PET scan shows a 4 cm area in the distal esophagus but no metastatic disease. We discussed the risk of the procedure with her and her family. We will see her back again in 6 in weeks and reevaluate her for possible consideration of surgery.   Past Medical History  Diagnosis Date  . Pernicious anemia   . Osteoarthritis   . Fibromyalgia   . Arthritis     left hip  . Skin lesion     chronic calcific cutaneous lesions  . Transfusion history     red cell transfusions in the past  . Adenocarcinoma of gastroesophageal junction   . HX of multiple bleeding ulcers 09/01/2011    Past Surgical History  Procedure Date  . Left leg fracture   . Miscarriages     G9,P4 four miscarriages and one set of twins  . Right hip replacement   . Tubal ligation   . Tonsillectomy     Family History  Problem Relation Age of Onset  . Cancer Maternal Grandmother     gynecologic  . Cancer Maternal Aunt     gynecologic  . Breast cancer Paternal Aunt   . Breast cancer Paternal Aunt   . Breast cancer Paternal Aunt     Social History History  Substance Use Topics  . Smoking status: Former Games developer  . Smokeless tobacco: Never Used  . Alcohol Use: No    Current Outpatient Prescriptions  Medication Sig Dispense  Refill  . acetaminophen (TYLENOL) 650 MG CR tablet Take 650 mg by mouth every 8 (eight) hours as needed.        Marland Kitchen amLODipine (NORVASC) 5 MG tablet Take 5 mg by mouth daily.        Marland Kitchen atenolol (TENORMIN) 50 MG tablet Take 50 mg by mouth daily.        . famotidine (PEPCID) 20 MG tablet Take 20 mg by mouth 2 (two) times daily as needed.        Marland Kitchen HYDROcodone-acetaminophen (VICODIN) 5-500 MG per tablet Take 1 tablet by mouth 2 (two) times daily.        Marland Kitchen lisinopril (PRINIVIL,ZESTRIL) 40 MG tablet Take 40 mg by mouth daily.        . predniSONE (DELTASONE) 1 MG tablet Take 1 mg by mouth 2 (two) times daily.        . RABEprazole (ACIPHEX) 20 MG tablet Take 20 mg by mouth daily.        . raloxifene (EVISTA) 60 MG tablet Take 60 mg by mouth daily.        Marland Kitchen tiZANidine (ZANAFLEX) 4 MG capsule Take 4 mg by mouth 1 day or 1 dose.        . traMADol (ULTRAM) 50 MG tablet Take 50 mg by mouth 2 (two) times daily.        Marland Kitchen  vitamin B-12 (CYANOCOBALAMIN) 1000 MCG tablet Inject 1,000 mcg into the skin once.          Allergies  Allergen Reactions  . Adhesive (Tape)   . Biaxin   . Darvon   . Epinephrine   . Morphine And Related   . Oxycodone   . Oxycontin   . Sulfa Antibiotics   . Vicodin (Hydrocodone-Acetaminophen)     Review of Systems  Constitutional: Negative for appetite change and fatigue.  HENT: Negative.   Eyes: Negative.   Respiratory: Negative.   Cardiovascular: Negative.   Gastrointestinal:       See history of present illness  Genitourinary: Negative.   Musculoskeletal: Positive for joint swelling and arthralgias.  Neurological: Negative.   Hematological: Negative.   Psychiatric/Behavioral: Negative.     BP 147/63  Pulse 76  Resp 20  Ht 5\' 3"  (1.6 m)  Wt 158 lb (71.668 kg)  BMI 27.99 kg/m2  SpO2 95% Physical Exam  Constitutional: She is oriented to person, place, and time. She appears well-developed and well-nourished.  HENT:  Head: Normocephalic and atraumatic.  Right Ear:  External ear normal.  Left Ear: External ear normal.  Nose: Nose normal.  Eyes: EOM are normal. Pupils are equal, round, and reactive to light. Scleral icterus is present.  Neck: Normal range of motion. Neck supple. No thyromegaly present.  Cardiovascular: Normal rate, regular rhythm, normal heart sounds and intact distal pulses.   No murmur heard. Pulmonary/Chest: Effort normal and breath sounds normal. No respiratory distress.  Abdominal: Bowel sounds are normal. She exhibits no mass. There is tenderness. There is no rebound and no guarding.  Musculoskeletal: She exhibits no tenderness.  Lymphadenopathy:    She has no cervical adenopathy.  Neurological: She is alert and oriented to person, place, and time. She has normal reflexes. No cranial nerve deficit.  Skin: Skin is warm and dry.  Psychiatric: She has a normal mood and affect. Her behavior is normal. Judgment and thought content normal.     Diagnostic Tests: CT scan shows thickening of the distal esophagus but no enlarged lymph nodes. PET scan is positive in the distal esophagus but no evidence of metastatic disease.   Impression: Adenocarcinoma of the esophagus probable stage IIIa   Plan: Radiation and chemotherapy followed by possible surgical resection.

## 2011-09-07 ENCOUNTER — Other Ambulatory Visit: Payer: Self-pay | Admitting: Oncology

## 2011-09-07 ENCOUNTER — Encounter (HOSPITAL_BASED_OUTPATIENT_CLINIC_OR_DEPARTMENT_OTHER): Payer: Medicare Other | Admitting: Oncology

## 2011-09-07 DIAGNOSIS — D500 Iron deficiency anemia secondary to blood loss (chronic): Secondary | ICD-10-CM

## 2011-09-07 DIAGNOSIS — D649 Anemia, unspecified: Secondary | ICD-10-CM

## 2011-09-07 DIAGNOSIS — C160 Malignant neoplasm of cardia: Secondary | ICD-10-CM

## 2011-09-07 DIAGNOSIS — D510 Vitamin B12 deficiency anemia due to intrinsic factor deficiency: Secondary | ICD-10-CM

## 2011-09-07 DIAGNOSIS — Z5111 Encounter for antineoplastic chemotherapy: Secondary | ICD-10-CM

## 2011-09-07 DIAGNOSIS — C155 Malignant neoplasm of lower third of esophagus: Secondary | ICD-10-CM

## 2011-09-07 LAB — CBC WITH DIFFERENTIAL/PLATELET
BASO%: 0.4 % (ref 0.0–2.0)
EOS%: 2.2 % (ref 0.0–7.0)
HCT: 34.1 % — ABNORMAL LOW (ref 34.8–46.6)
MCH: 28.3 pg (ref 25.1–34.0)
MCHC: 32 g/dL (ref 31.5–36.0)
MONO#: 0.7 10*3/uL (ref 0.1–0.9)
NEUT%: 79.9 % — ABNORMAL HIGH (ref 38.4–76.8)
RBC: 3.85 10*6/uL (ref 3.70–5.45)
RDW: 15.6 % — ABNORMAL HIGH (ref 11.2–14.5)
WBC: 6.8 10*3/uL (ref 3.9–10.3)
lymph#: 0.4 10*3/uL — ABNORMAL LOW (ref 0.9–3.3)

## 2011-09-08 LAB — CROSSMATCH: ABO/RH(D): O NEG

## 2011-09-14 ENCOUNTER — Other Ambulatory Visit: Payer: Self-pay | Admitting: Oncology

## 2011-09-14 ENCOUNTER — Encounter (HOSPITAL_BASED_OUTPATIENT_CLINIC_OR_DEPARTMENT_OTHER): Payer: Medicare Other | Admitting: Oncology

## 2011-09-14 DIAGNOSIS — Z5111 Encounter for antineoplastic chemotherapy: Secondary | ICD-10-CM

## 2011-09-14 DIAGNOSIS — D649 Anemia, unspecified: Secondary | ICD-10-CM

## 2011-09-14 DIAGNOSIS — C155 Malignant neoplasm of lower third of esophagus: Secondary | ICD-10-CM

## 2011-09-14 DIAGNOSIS — D500 Iron deficiency anemia secondary to blood loss (chronic): Secondary | ICD-10-CM

## 2011-09-14 DIAGNOSIS — C160 Malignant neoplasm of cardia: Secondary | ICD-10-CM

## 2011-09-14 LAB — CBC WITH DIFFERENTIAL/PLATELET
BASO%: 0.4 % (ref 0.0–2.0)
LYMPH%: 5.5 % — ABNORMAL LOW (ref 14.0–49.7)
MCHC: 31.7 g/dL (ref 31.5–36.0)
MCV: 89.3 fL (ref 79.5–101.0)
MONO#: 0.7 10*3/uL (ref 0.1–0.9)
MONO%: 13 % (ref 0.0–14.0)
Platelets: 220 10*3/uL (ref 145–400)
RBC: 3.74 10*6/uL (ref 3.70–5.45)
RDW: 16.3 % — ABNORMAL HIGH (ref 11.2–14.5)
WBC: 5.3 10*3/uL (ref 3.9–10.3)
nRBC: 0 % (ref 0–0)

## 2011-09-20 LAB — CROSSMATCH
ABO/RH(D): O NEG
Antibody Screen: NEGATIVE

## 2011-09-21 ENCOUNTER — Other Ambulatory Visit: Payer: Self-pay | Admitting: Hematology and Oncology

## 2011-09-21 ENCOUNTER — Encounter (HOSPITAL_BASED_OUTPATIENT_CLINIC_OR_DEPARTMENT_OTHER): Payer: Medicare Other | Admitting: Oncology

## 2011-09-21 DIAGNOSIS — D500 Iron deficiency anemia secondary to blood loss (chronic): Secondary | ICD-10-CM

## 2011-09-21 DIAGNOSIS — L989 Disorder of the skin and subcutaneous tissue, unspecified: Secondary | ICD-10-CM

## 2011-09-21 DIAGNOSIS — C160 Malignant neoplasm of cardia: Secondary | ICD-10-CM

## 2011-09-21 DIAGNOSIS — C155 Malignant neoplasm of lower third of esophagus: Secondary | ICD-10-CM

## 2011-09-21 DIAGNOSIS — D649 Anemia, unspecified: Secondary | ICD-10-CM

## 2011-09-21 DIAGNOSIS — R509 Fever, unspecified: Secondary | ICD-10-CM

## 2011-09-21 DIAGNOSIS — Z5111 Encounter for antineoplastic chemotherapy: Secondary | ICD-10-CM

## 2011-09-21 LAB — CBC WITH DIFFERENTIAL/PLATELET
BASO%: 0.1 % (ref 0.0–2.0)
EOS%: 0.3 % (ref 0.0–7.0)
Eosinophils Absolute: 0 10*3/uL (ref 0.0–0.5)
LYMPH%: 2.6 % — ABNORMAL LOW (ref 14.0–49.7)
MCH: 30.3 pg (ref 25.1–34.0)
MCHC: 34.3 g/dL (ref 31.5–36.0)
MCV: 88.2 fL (ref 79.5–101.0)
MONO%: 7.8 % (ref 0.0–14.0)
Platelets: 172 10*3/uL (ref 145–400)
RBC: 3.26 10*6/uL — ABNORMAL LOW (ref 3.70–5.45)

## 2011-09-27 ENCOUNTER — Encounter (HOSPITAL_BASED_OUTPATIENT_CLINIC_OR_DEPARTMENT_OTHER): Payer: Medicare Other | Admitting: Oncology

## 2011-09-27 ENCOUNTER — Other Ambulatory Visit: Payer: Self-pay | Admitting: Oncology

## 2011-09-27 DIAGNOSIS — D500 Iron deficiency anemia secondary to blood loss (chronic): Secondary | ICD-10-CM

## 2011-09-27 DIAGNOSIS — C155 Malignant neoplasm of lower third of esophagus: Secondary | ICD-10-CM

## 2011-09-27 DIAGNOSIS — R5383 Other fatigue: Secondary | ICD-10-CM

## 2011-09-27 DIAGNOSIS — D649 Anemia, unspecified: Secondary | ICD-10-CM

## 2011-09-27 DIAGNOSIS — Z5111 Encounter for antineoplastic chemotherapy: Secondary | ICD-10-CM

## 2011-09-27 DIAGNOSIS — C160 Malignant neoplasm of cardia: Secondary | ICD-10-CM

## 2011-09-27 LAB — HOLD TUBE, BLOOD BANK

## 2011-09-27 LAB — COMPREHENSIVE METABOLIC PANEL
ALT: <8 U/L (ref 0–35)
AST: 17 U/L (ref 0–37)
Calcium: 9.7 mg/dL (ref 8.4–10.5)
Chloride: 106 mEq/L (ref 96–112)
Creatinine, Ser: 0.92 mg/dL (ref 0.50–1.10)
Total Bilirubin: 0.3 mg/dL (ref 0.3–1.2)

## 2011-09-27 LAB — CBC WITH DIFFERENTIAL/PLATELET
BASO%: 0.3 % (ref 0.0–2.0)
LYMPH%: 2.8 % — ABNORMAL LOW (ref 14.0–49.7)
MCHC: 33.7 g/dL (ref 31.5–36.0)
MONO#: 0.6 10*3/uL (ref 0.1–0.9)
Platelets: 190 10*3/uL (ref 145–400)
RBC: 3.69 10*6/uL — ABNORMAL LOW (ref 3.70–5.45)
WBC: 4.1 10*3/uL (ref 3.9–10.3)

## 2011-09-28 ENCOUNTER — Encounter (HOSPITAL_BASED_OUTPATIENT_CLINIC_OR_DEPARTMENT_OTHER): Payer: Medicare Other | Admitting: Oncology

## 2011-09-28 DIAGNOSIS — C160 Malignant neoplasm of cardia: Secondary | ICD-10-CM

## 2011-09-28 DIAGNOSIS — Z5111 Encounter for antineoplastic chemotherapy: Secondary | ICD-10-CM

## 2011-10-04 ENCOUNTER — Other Ambulatory Visit: Payer: Self-pay | Admitting: Oncology

## 2011-10-04 ENCOUNTER — Encounter (HOSPITAL_BASED_OUTPATIENT_CLINIC_OR_DEPARTMENT_OTHER): Payer: Medicare Other | Admitting: Oncology

## 2011-10-04 DIAGNOSIS — C155 Malignant neoplasm of lower third of esophagus: Secondary | ICD-10-CM

## 2011-10-04 DIAGNOSIS — Z5111 Encounter for antineoplastic chemotherapy: Secondary | ICD-10-CM

## 2011-10-04 DIAGNOSIS — D649 Anemia, unspecified: Secondary | ICD-10-CM

## 2011-10-04 DIAGNOSIS — C160 Malignant neoplasm of cardia: Secondary | ICD-10-CM

## 2011-10-04 DIAGNOSIS — D500 Iron deficiency anemia secondary to blood loss (chronic): Secondary | ICD-10-CM

## 2011-10-04 LAB — CBC WITH DIFFERENTIAL/PLATELET
BASO%: 0.6 % (ref 0.0–2.0)
EOS%: 0.8 % (ref 0.0–7.0)
HCT: 32.6 % — ABNORMAL LOW (ref 34.8–46.6)
LYMPH%: 9.6 % — ABNORMAL LOW (ref 14.0–49.7)
MCH: 29.5 pg (ref 25.1–34.0)
MCHC: 33.4 g/dL (ref 31.5–36.0)
MCV: 88.3 fL (ref 79.5–101.0)
MONO%: 7.3 % (ref 0.0–14.0)
NEUT%: 81.7 % — ABNORMAL HIGH (ref 38.4–76.8)
Platelets: 141 10*3/uL — ABNORMAL LOW (ref 145–400)

## 2011-10-13 ENCOUNTER — Other Ambulatory Visit (HOSPITAL_BASED_OUTPATIENT_CLINIC_OR_DEPARTMENT_OTHER): Payer: Medicare Other | Admitting: Lab

## 2011-10-13 ENCOUNTER — Ambulatory Visit (HOSPITAL_BASED_OUTPATIENT_CLINIC_OR_DEPARTMENT_OTHER): Payer: Medicare Other | Admitting: Hematology and Oncology

## 2011-10-13 ENCOUNTER — Ambulatory Visit: Payer: Medicare Other | Admitting: Hematology and Oncology

## 2011-10-13 ENCOUNTER — Telehealth: Payer: Self-pay | Admitting: Oncology

## 2011-10-13 ENCOUNTER — Other Ambulatory Visit: Payer: Self-pay | Admitting: Nurse Practitioner

## 2011-10-13 ENCOUNTER — Other Ambulatory Visit: Payer: Self-pay

## 2011-10-13 ENCOUNTER — Other Ambulatory Visit: Payer: Medicare Other | Admitting: Lab

## 2011-10-13 ENCOUNTER — Other Ambulatory Visit: Payer: Self-pay | Admitting: Oncology

## 2011-10-13 DIAGNOSIS — R110 Nausea: Secondary | ICD-10-CM

## 2011-10-13 DIAGNOSIS — C160 Malignant neoplasm of cardia: Secondary | ICD-10-CM

## 2011-10-13 DIAGNOSIS — R634 Abnormal weight loss: Secondary | ICD-10-CM

## 2011-10-13 DIAGNOSIS — E860 Dehydration: Secondary | ICD-10-CM

## 2011-10-13 LAB — CBC WITH DIFFERENTIAL/PLATELET
BASO%: 0.8 % (ref 0.0–2.0)
EOS%: 0.4 % (ref 0.0–7.0)
MCH: 29.5 pg (ref 25.1–34.0)
MCHC: 32.9 g/dL (ref 31.5–36.0)
MCV: 89.7 fL (ref 79.5–101.0)
MONO%: 27 % — ABNORMAL HIGH (ref 0.0–14.0)
RBC: 3.12 10*6/uL — ABNORMAL LOW (ref 3.70–5.45)
RDW: 19.9 % — ABNORMAL HIGH (ref 11.2–14.5)
nRBC: 1 % — ABNORMAL HIGH (ref 0–0)

## 2011-10-13 MED ORDER — SODIUM CHLORIDE 0.9 % IV SOLN
Freq: Once | INTRAVENOUS | Status: AC
  Administered 2011-10-13: 12:00:00 via INTRAVENOUS

## 2011-10-13 MED ORDER — DEXAMETHASONE SODIUM PHOSPHATE 10 MG/ML IJ SOLN
10.0000 mg | Freq: Once | INTRAMUSCULAR | Status: AC
  Administered 2011-10-13: 10 mg via INTRAVENOUS

## 2011-10-13 MED ORDER — ONDANSETRON 8 MG/50ML IVPB (CHCC)
8.0000 mg | Freq: Once | INTRAVENOUS | Status: AC
  Administered 2011-10-13: 8 mg via INTRAVENOUS

## 2011-10-13 MED ORDER — SODIUM CHLORIDE 0.9 % IV SOLN: INTRAVENOUS | Status: DC

## 2011-10-13 NOTE — Telephone Encounter (Signed)
S/w the pt's husband and he is aware of the iv fluid appt on 10/14/2011 and for the pt to pick up her appt calendar.

## 2011-10-13 NOTE — Progress Notes (Deleted)
OFFICE PROGRESS NOTE  Interval history:  Ms. Moman is a 77 year old woman with adenocarcinoma of the gastroesophageal junction. She completed radiation 08/26/2011 through 10/04/2011. She completed the fifth and final week of Taxol/carboplatin on 10/04/2011. She is seen today for scheduled followup.  Ms. Tibbitts reports persistent nausea. She denies vomiting. She is taking promethazine with partial relief of the nausea. She has tried Zofran and Reglan without improvement in the nausea. She continues to have pain with swallowing. She is using Magic mouthwash. She is trying to "force" fluids. She denies diarrhea. No fever. Appetite is poor. She has lost weight.  She is on Norvasc, Prinivil and atenolol for hypertension.   Objective: Vital signs in last 24 hours: Blood pressure 77/50, pulse 85, temperature 97.1 F (36.2 C), temperature source Oral, height 5\' 3"  (1.6 m), weight 150 lb 12.8 oz (68.402 kg). Repeat blood pressure 90/56.  Oropharynx has a dry appearance. Lungs are clear. No wheezes or rales. Regular cardiac rhythm. Abdomen is soft. She is tender over the epigastric region. Extremities are without edema. Calves are soft and nontender. Skin turgor is decreased. Left mid back with erythema and superficial skin breakdown.    Lab Results:  Hemoglobin 9.2 white count 2.5 absolute neutrophil count 1.2 platelet count 247,000      Studies/Results: No results found.  Medications: I have reviewed the patient's current medications.  Assessment/Plan:  #1 Adenocarcinoma of the gastroesophageal junction status post endoscopic evaluation 07/27/2011 confirming a nonobstructing gastroesophageal junction mass. Staging CT scan showed no evidence for distant metastatic disease. Staging PET scan 08/20/2011 revealed hypermetabolic activity at the distal esophagus and no evidence for metastatic disease. She completed radiation 08/26/2011 through 10/04/2011 and weekly Taxol/carboplatin  chemotherapy 08/31/2011 through 10/04/2011. #2 Solid/liquid dysphagia, subxiphoid discomfort, and weight loss secondary to esophagus cancer.  #3 Nausea-persistent. Question if related to radiation, radiation esophagitis. #4 Dehydration secondary to nausea. 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 She will continue Magic mouthwash as needed. She also has Vicodin as needed. #6 Chronic anemia with a history of chronic gastrointestinal blood loss. #7 Osteoarthritis #8 "Pernicious" anemia. #9 Fibromyalgia. #10 Chronic "calcific" cutaneous lesions. #11 Fever and erythema with associated tenderness involving one of the calcific cutaneous lesions at the left anterior lateral leg on 09/21/2011. Improved following Keflex therapy. #12 multiple medication allergies. #13 Progressive anemia and leukopenia secondary to radiation and chemotherapy.  #14 Hypertension-she currently takes Norvasc, Prinivil and atenolol. She is hypotensive at present likely due to dehydration. #15 Disposition-Ms. Giampietro has completed the course of radiation and weekly Taxol/carboplatin chemotherapy. She has persistent nausea of unclear etiology. She appears to be dehydrated. She is on 3 antihypertensive medications. We have instructed her to discontinue Norvasc and Prinivil. She will receive a liter of IV fluids in the office today as well as Zofran 8 mg intravenously. We are also arranging for her receive a liter of IV fluids in the office on 10/14/2011. She will return for a followup visit on 10/18/2011. She will contact the office in the interim with any problems.  Plan reviewed with Dr. Truett Perna.      Sharlet Notaro ANP/GNP-BC

## 2011-10-13 NOTE — Progress Notes (Signed)
OFFICE PROGRESS NOTE  Interval history:  [name] is a 77 year old woman with adenocarcinoma of the gastroesophageal junction. She completed radiation 08/26/2011 through 10/04/2011. She completed the fifth and final week of Taxol/carboplatin on 10/04/2011. She is seen today for scheduled followup.  [name] reports persistent nausea. She denies vomiting. She is taking promethazine with partial relief of the nausea. She has tried Zofran and Reglan without improvement in the nausea. She continues to have pain with swallowing. She is using Magic mouthwash. She is trying to "force" fluids. She denies diarrhea. No fever. Appetite is poor. She has lost weight.  She is on Norvasc, Prinivil and atenolol for hypertension.   Objective: Vital signs in last 24 hours: Blood pressure 77/50, pulse 85, temperature 97.1 F (36.2 C), temperature source Oral, height 5\' 3"  (1.6 m), weight 150 lb 12.8 oz (68.402 kg). Repeat blood pressure 90/56.  Oropharynx has a dry appearance. Lungs are clear. No wheezes or rales. Regular cardiac rhythm. Abdomen is soft. She is tender over the epigastric region. Extremities are without edema. Calves are soft and nontender. Skin turgor is decreased. Left mid back with erythema and superficial skin breakdown.    Lab Results:  Hemoglobin 9.2 white count 2.5 absolute neutrophil count 1.2 platelet count 247,000      Studies/Results: No results found.  Medications: I have reviewed the patient's current medications.  Assessment/Plan:  #1 Adenocarcinoma of the gastroesophageal junction status post endoscopic evaluation 07/27/2011 confirming a nonobstructing gastroesophageal junction mass. Staging CT scan showed no evidence for distant metastatic disease. Staging PET scan 08/20/2011 revealed hypermetabolic activity at the distal esophagus and no evidence for metastatic disease. She completed radiation 08/26/2011 through 10/04/2011 and weekly Taxol/carboplatin  chemotherapy 08/31/2011 through 10/04/2011. #2 Solid/liquid dysphagia, subxiphoid discomfort, and weight loss secondary to esophagus cancer.  #3 Nausea-persistent. Question if related to radiation, radiation esophagitis. #4 Dehydration secondary to nausea. 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 She will continue Magic mouthwash as needed. She also has Vicodin as needed. #6 Chronic anemia with a history of chronic gastrointestinal blood loss. #7 Osteoarthritis #8 "Pernicious" anemia. #9 Fibromyalgia. #10 Chronic "calcific" cutaneous lesions. #11 Fever and erythema with associated tenderness involving one of the calcific cutaneous lesions at the left anterior lateral leg on 09/21/2011. Improved following Keflex therapy. #12 multiple medication allergies. #13 Progressive anemia and leukopenia secondary to radiation and chemotherapy.  #14 Hypertension-she currently takes Norvasc, Prinivil and atenolol. She is hypotensive at present likely due to dehydration. #15 Disposition-[name] has completed the course of radiation and weekly Taxol/carboplatin chemotherapy. She has persistent nausea of unclear etiology. She appears to be dehydrated. She is on 3 antihypertensive medications. We have instructed her to discontinue Norvasc and Prinivil. She will receive a liter of IV fluids in the office today as well as Zofran 8 mg intravenously. We are also arranging for her receive a liter of IV fluids in the office on 10/14/2011. She will return for a followup visit on 10/18/2011. She will contact the office in the interim with any problems.  Plan reviewed with Dr. Truett Perna.      Lonna Cobb ANP/GNP-BC

## 2011-10-13 NOTE — Progress Notes (Deleted)
OFFICE PROGRESS NOTE  Interval history:     Objective: Blood pressure 77/50, pulse 85, temperature 97.1 F (36.2 C), temperature source Oral, height 5\' 3"  (1.6 m), weight 150 lb 12.8 oz (68.402 kg).    Lab Results:  Lab Results  Component Value Date   HGB 9.2* 10/13/2011   HCT 28.0* 10/13/2011   PLT 247 10/13/2011   GLUCOSE 107* 09/27/2011   GLUCOSE 107* 09/27/2011   ALT <8 09/27/2011   ALT <8 09/27/2011   AST 17 09/27/2011   AST 17 09/27/2011   NA 139 09/27/2011   NA 139 09/27/2011   K 4.0 09/27/2011   K 4.0 09/27/2011   CL 106 09/27/2011   CL 106 09/27/2011   CREATININE 0.92 09/27/2011   CREATININE 0.92 09/27/2011   BUN 13 09/27/2011   BUN 13 09/27/2011   CO2 22 09/27/2011   CO2 22 09/27/2011      Studies/Results: No results found.  Medications: I have reviewed the patient's current medications.  Assessment/Plan:    Disposition:       Lonna Cobb ANP/GNP-BC

## 2011-10-14 ENCOUNTER — Ambulatory Visit (HOSPITAL_BASED_OUTPATIENT_CLINIC_OR_DEPARTMENT_OTHER): Payer: Medicare Other

## 2011-10-14 ENCOUNTER — Other Ambulatory Visit: Payer: Self-pay | Admitting: Nurse Practitioner

## 2011-10-14 VITALS — BP 98/62 | HR 85 | Temp 97.6°F

## 2011-10-14 DIAGNOSIS — C155 Malignant neoplasm of lower third of esophagus: Secondary | ICD-10-CM

## 2011-10-14 DIAGNOSIS — E860 Dehydration: Secondary | ICD-10-CM

## 2011-10-14 MED ORDER — SODIUM CHLORIDE 0.9 % IV SOLN
INTRAVENOUS | Status: DC
  Administered 2011-10-14: 10:00:00 via INTRAVENOUS

## 2011-10-18 ENCOUNTER — Ambulatory Visit (HOSPITAL_BASED_OUTPATIENT_CLINIC_OR_DEPARTMENT_OTHER): Payer: Medicare Other | Admitting: Nurse Practitioner

## 2011-10-18 ENCOUNTER — Telehealth: Payer: Self-pay | Admitting: Oncology

## 2011-10-18 VITALS — BP 119/74 | HR 94 | Temp 98.8°F | Ht 63.0 in | Wt 152.5 lb

## 2011-10-18 DIAGNOSIS — C160 Malignant neoplasm of cardia: Secondary | ICD-10-CM

## 2011-10-18 NOTE — Progress Notes (Signed)
OFFICE PROGRESS NOTE  Interval history:  [name] is a 77 year old woman with adenocarcinoma of the gastroesophageal junction. She completed radiation 08/26/2011 through 10/04/2011. She completed the fifth and final week of Taxol/carboplatin on 10/04/2011.   [name] was seen in the office on 10/13/2011 for routine followup. She was experiencing persistent nausea. She was felt to be dehydrated. She received IV fluids in the office on 11/07 and 10/14/2011. In addition, on 10/13/2011 she was also given Zofran and IV Decadron. She was found to be hypotensive. Blood pressure medications were adjusted.  [name] reports she is feeling better. The nausea has improved. She continues Phenergan as needed. She denies vomiting. She is tolerating fluids and some solids. The pain with swallowing is better. She takes Vicodin as needed. No diarrhea. No fever.   Objective: Vital signs in last 24 hours: Temperature 98.8 respirations 20 rate 94 blood pressure 119/74 weight 152.8 pounds  Oropharynx is without thrush or ulceration. Mucous membranes appear moist. Lungs are clear. No wheezes or rales. Regular cardiac rhythm. Abdomen is soft. Extremities are without edema. Calves are soft and nontender. Skin turgor is decreased. Left mid back with  superficial skin breakdown. Right posterior lower arm with tender area of superficial phlebitis. The right arm is not edematous.     Lab Results: None.   Studies/Results: No results found.  Medications: I have reviewed the patient's current medications.  Assessment/Plan:  #1 Adenocarcinoma of the gastroesophageal junction status post endoscopic evaluation 07/27/2011 confirming a nonobstructing gastroesophageal junction mass. Staging CT scan showed no evidence for distant metastatic disease. Staging PET scan 08/20/2011 revealed hypermetabolic activity at the distal esophagus and no evidence for metastatic disease. She completed radiation 08/26/2011 through  10/04/2011 and weekly Taxol/carboplatin chemotherapy 08/31/2011 through 10/04/2011. #2 Solid/liquid dysphagia, subxiphoid discomfort, and weight loss secondary to esophagus cancer.  #3 Nausea-Improved.Question if related to radiation, radiation esophagitis. #4 Dehydration secondary to nausea,resolved. 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 She will continue Magic mouthwash as needed. She also has Vicodin as needed. #6 Chronic anemia with a history of chronic gastrointestinal blood loss. #7 Osteoarthritis #8 "Pernicious" anemia. #9 Fibromyalgia. #10 Chronic "calcific" cutaneous lesions. #11 Fever and erythema with associated tenderness involving one of the calcific cutaneous lesions at the left anterior lateral leg on 09/21/2011. Improved following Keflex therapy. #12 Multiple medication allergies. #13 Progressive anemia and leukopenia secondary to radiation and chemotherapy.  #14 Hypertension-Norvasc and Prinivil were discontinued following her visit 10/13/2011 due to hypotension. She continues Atenolol. Blood pressure is better.  #15 Superficial phlebitis right arm. She will apply warm compresses and increase aspirin to 81mg  BID for the next one week. She will contact the office if she develops edema of the arm or erythema. #16 Disposition-[name] appears improved. The nausea and odynophagia are better. She will continue to push fluids by mouth. We are scheduling a return visit with Dr. Truett Perna in the next 3-4 weeks. She has a followup visit with Dr. Edwyna Shell on 10/26/2011.  She will contact the office prior to her next visit with any problems.  Plan reviewed with Dr. Truett Perna.      Lonna Cobb ANP/GNP-BC

## 2011-10-18 NOTE — Telephone Encounter (Signed)
gve the pt her dec 2012 qppt calendar

## 2011-10-20 ENCOUNTER — Encounter: Payer: Self-pay | Admitting: *Deleted

## 2011-10-26 ENCOUNTER — Ambulatory Visit (INDEPENDENT_AMBULATORY_CARE_PROVIDER_SITE_OTHER): Payer: Medicare Other | Admitting: Thoracic Surgery

## 2011-10-26 ENCOUNTER — Other Ambulatory Visit: Payer: Self-pay | Admitting: Thoracic Surgery

## 2011-10-26 ENCOUNTER — Encounter: Payer: Self-pay | Admitting: Thoracic Surgery

## 2011-10-26 VITALS — BP 108/62 | HR 94 | Resp 18

## 2011-10-26 DIAGNOSIS — C159 Malignant neoplasm of esophagus, unspecified: Secondary | ICD-10-CM

## 2011-10-26 NOTE — Progress Notes (Signed)
HPI patient returns after receiving her chemotherapy and radiation. She is swallowing well. And maintaining her weight. I plan to reconsider. PET scan repeat in 2 weeks. We will get pulmonary function test. We will arrange for cardiac clearance. We will see her back after these previous test.   Current Outpatient Prescriptions  Medication Sig Dispense Refill  . acetaminophen (TYLENOL) 650 MG CR tablet Take 650 mg by mouth every 8 (eight) hours as needed.        Marland Kitchen atenolol (TENORMIN) 50 MG tablet Take 50 mg by mouth 2 (two) times daily.       . famotidine (PEPCID) 20 MG tablet Take 20 mg by mouth 2 (two) times daily as needed.        . predniSONE (DELTASONE) 1 MG tablet Take 1 mg by mouth 2 (two) times daily.        . RABEprazole (ACIPHEX) 20 MG tablet Take 20 mg by mouth daily.        . raloxifene (EVISTA) 60 MG tablet Take 60 mg by mouth daily.        Marland Kitchen tiZANidine (ZANAFLEX) 4 MG capsule Take 4 mg by mouth 1 day or 1 dose.        . traMADol (ULTRAM) 50 MG tablet Take 50 mg by mouth 2 (two) times daily.        . vitamin B-12 (CYANOCOBALAMIN) 1000 MCG tablet Inject 1,000 mcg into the skin once.        Marland Kitchen amLODipine (NORVASC) 5 MG tablet Take 5 mg by mouth daily.        Marland Kitchen HYDROcodone-acetaminophen (VICODIN) 5-500 MG per tablet Take 1 tablet by mouth 2 (two) times daily.        Marland Kitchen lisinopril (PRINIVIL,ZESTRIL) 40 MG tablet Take 40 mg by mouth daily.           Review of Systems: Unchanged   Physical Exam lungs are clear to auscultation percussion   Diagnostic Tests: None   Impression: Cancer of the gastroesophageal junction. Status post chemoradiation   Plan: Return in 3 weeks after a PET scan.

## 2011-11-05 ENCOUNTER — Ambulatory Visit (HOSPITAL_COMMUNITY)
Admission: RE | Admit: 2011-11-05 | Discharge: 2011-11-05 | Disposition: A | Discharge status: Home / Self Care | Payer: Medicare Other | Source: Ambulatory Visit | Attending: Thoracic Surgery | Admitting: Thoracic Surgery

## 2011-11-05 DIAGNOSIS — IMO0001 Reserved for inherently not codable concepts without codable children: Secondary | ICD-10-CM | POA: Insufficient documentation

## 2011-11-05 DIAGNOSIS — D510 Vitamin B12 deficiency anemia due to intrinsic factor deficiency: Secondary | ICD-10-CM | POA: Insufficient documentation

## 2011-11-05 DIAGNOSIS — Z79899 Other long term (current) drug therapy: Secondary | ICD-10-CM | POA: Insufficient documentation

## 2011-11-05 DIAGNOSIS — C160 Malignant neoplasm of cardia: Secondary | ICD-10-CM | POA: Insufficient documentation

## 2011-11-05 DIAGNOSIS — M129 Arthropathy, unspecified: Secondary | ICD-10-CM | POA: Insufficient documentation

## 2011-11-05 DIAGNOSIS — C159 Malignant neoplasm of esophagus, unspecified: Secondary | ICD-10-CM

## 2011-11-05 LAB — GLUCOSE, CAPILLARY: Glucose-Capillary: 94 mg/dL (ref 70–99)

## 2011-11-05 MED ORDER — FLUDEOXYGLUCOSE F - 18 (FDG) INJECTION
16.2000 | Freq: Once | INTRAVENOUS | Status: AC | PRN
  Administered 2011-11-05: 16.2 via INTRAVENOUS

## 2011-11-11 ENCOUNTER — Ambulatory Visit (HOSPITAL_BASED_OUTPATIENT_CLINIC_OR_DEPARTMENT_OTHER): Payer: Medicare Other | Admitting: Oncology

## 2011-11-11 ENCOUNTER — Other Ambulatory Visit (HOSPITAL_BASED_OUTPATIENT_CLINIC_OR_DEPARTMENT_OTHER): Payer: Medicare Other | Admitting: Lab

## 2011-11-11 ENCOUNTER — Telehealth: Payer: Self-pay | Admitting: *Deleted

## 2011-11-11 ENCOUNTER — Telehealth: Payer: Self-pay | Admitting: Oncology

## 2011-11-11 VITALS — BP 99/63 | HR 69 | Temp 98.1°F | Ht 63.0 in | Wt 152.4 lb

## 2011-11-11 DIAGNOSIS — C155 Malignant neoplasm of lower third of esophagus: Secondary | ICD-10-CM

## 2011-11-11 DIAGNOSIS — C160 Malignant neoplasm of cardia: Secondary | ICD-10-CM

## 2011-11-11 DIAGNOSIS — D649 Anemia, unspecified: Secondary | ICD-10-CM

## 2011-11-11 LAB — CBC WITH DIFFERENTIAL/PLATELET
BASO%: 0.4 % (ref 0.0–2.0)
Basophils Absolute: 0 10*3/uL (ref 0.0–0.1)
Eosinophils Absolute: 0.1 10*3/uL (ref 0.0–0.5)
HCT: 29.2 % — ABNORMAL LOW (ref 34.8–46.6)
HGB: 9.8 g/dL — ABNORMAL LOW (ref 11.6–15.9)
MONO#: 0.9 10*3/uL (ref 0.1–0.9)
NEUT#: 3.6 10*3/uL (ref 1.5–6.5)
NEUT%: 61 % (ref 38.4–76.8)
WBC: 5.9 10*3/uL (ref 3.9–10.3)
lymph#: 1.2 10*3/uL (ref 0.9–3.3)

## 2011-11-11 NOTE — Telephone Encounter (Signed)
Called pt, Dr. Truett Perna discussed case with Drs. Edwyna Shell and Morse. It is agreed that she should have endosocopy prior to surgery. She verbalized understanding. Pt requested I make appt for her.  Left message on voicemail for Morrie Sheldon, with Dr. Madilyn Fireman [phone]) to have pt scheduled ASAP.

## 2011-11-11 NOTE — Telephone Encounter (Signed)
gve the pt her jan 2013 appt calendar °

## 2011-11-11 NOTE — Progress Notes (Signed)
OFFICE PROGRESS NOTE   INTERVAL HISTORY:   Ms. returns as scheduled. She reports a markedly improvement in the nausea since she was here a few weeks ago. She has no difficulty in or drinking at present. She reports some nausea and anorexia in the afternoon and evening.  She is undergoing a preoperative evaluation by Dr. Edwyna Shell.  Objective:  Vital signs in last 24 hours:  Blood pressure 99/63, pulse 69, temperature 98.1 F (36.7 C), temperature source Oral, height 5\' 3"  (1.6 m), weight 152 lb 6.4 oz (69.128 kg).    HEENT: No thrush or ulcer Lymphatics: No cervical, supraclavicular, or axillary node Resp: Distant breath sounds with end inspiratory rhonchi at the posterior base bilaterally Cardio: Regular rate and GI: Mild tenderness in the subxiphoid region. No mass. No hepatomegaly Vascular: Trace pretibial edema bilaterally  Skin: Radiation hyperpigmentation at the upper back     Lab Results:  CBC  Lab Results  Component Value Date   WBC 5.9 11/11/2011   HGB 9.8* 11/11/2011   HCT 29.2* 11/11/2011   MCV 93.7 11/11/2011   PLT 239 11/11/2011    Studies/Results: Restaging PET scan on November 30 reveals internal improvement in the appearance of the distal esophagus with a decrease in diffuse wall thickening and a decrease in the FDG activity. No evidence for metastatic disease in the chest, abdomen, or pelvis.   Medications: I have reviewed the patient's current medications.  Assessment/Plan: #1  Adenocarcinoma of the gastroesophageal junction status post endoscopic evaluation 07/27/2011 confirming a nonobstructing gastroesophageal junction mass. Staging CT scan showed no evidence for distant metastatic disease. Staging PET scan 08/20/2011 revealed hypermetabolic activity at the distal esophagus and no evidence for metastatic disease. She completed radiation 08/26/2011 through 10/04/2011 and weekly Taxol/carboplatin chemotherapy 08/31/2011 through 10/04/2011.                                -restaging PET scan 11/05/2011 confirmed a decrease in the FDG activity at the distal esophagus with no evidence for metastatic disease. #2 Solid/liquid dysphagia, subxiphoid discomfort, and weight loss secondary to esophagus cancer, improved #3 Nausea-Improved.Question if related to radiation, radiation esophagitis, much improved  #4 Dehydration secondary to nausea,resolved.  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  #7 Osteoarthritis  #8 "Pernicious" anemia.  #9 Fibromyalgia.  #10 Chronic "calcific" cutaneous lesions.  #11 Fever and erythema with associated tenderness involving one of the calcific cutaneous lesions at the left anterior lateral leg on 09/21/2011. Improved following Keflex therapy.  #12 Multiple medication allergies.   #13 Hypertension-Norvasc and Prinivil were discontinued following her visit 10/13/2011 due to hypotension. She continues Atenolol. Blood pressure is better.  #15 Superficial phlebitis right arm on 10/18/2011.    Disposition:  [name] appears well today. Her performance status is much improved compared to when she was here on November 12. A restaging PET scan shows improvement in the distal esophagus primary and no evidence for metastatic disease. She is undergoing a preoperative evaluation by Dr. Edwyna Shell. She appears to have COPD. She understands an esophagectomy procedure can be associated with significant morbidity. I plan to discuss the case with Dr. Edwyna Shell. We will consider referring her for a repeat upper endoscopy prior to deciding on surgery.  [name] will return for an office visit here in 6 weeks.   Lucile Shutters, MD  11/11/2011  1:04 PM

## 2011-11-15 ENCOUNTER — Other Ambulatory Visit: Payer: Self-pay | Admitting: Gastroenterology

## 2011-11-15 NOTE — Procedures (Signed)
On 08/25/2011, I approved the 3D conformal treatment plan for [name].  The patient was simulated on our CT simulator and I have created a 4 field plan utilizing 3D conformal radiotherapy in order to deliver an adequate dose to her disease while sparing her normal tissues which include her spinal cord, her lungs, her heart and her kidneys.  3D conformal radiotherapy is an important modality in order to achieve these goals.    ______________________________ Grayland Jack, M.D. SES/MEDQ  D:  11/15/2011  T:  11/15/2011  Job:  [id number]

## 2011-11-16 ENCOUNTER — Encounter: Payer: Self-pay | Admitting: Cardiology

## 2011-11-16 ENCOUNTER — Encounter: Payer: Self-pay | Admitting: Thoracic Surgery

## 2011-11-16 ENCOUNTER — Ambulatory Visit (INDEPENDENT_AMBULATORY_CARE_PROVIDER_SITE_OTHER): Payer: Medicare Other | Admitting: Cardiology

## 2011-11-16 ENCOUNTER — Ambulatory Visit (INDEPENDENT_AMBULATORY_CARE_PROVIDER_SITE_OTHER): Payer: Medicare Other | Admitting: Thoracic Surgery

## 2011-11-16 VITALS — BP 135/60 | HR 94 | Resp 20 | Ht 63.0 in | Wt 152.0 lb

## 2011-11-16 VITALS — BP 142/73 | HR 89 | Ht 64.0 in | Wt 152.0 lb

## 2011-11-16 DIAGNOSIS — C153 Malignant neoplasm of upper third of esophagus: Secondary | ICD-10-CM

## 2011-11-16 DIAGNOSIS — I10 Essential (primary) hypertension: Secondary | ICD-10-CM

## 2011-11-16 DIAGNOSIS — Z01810 Encounter for preprocedural cardiovascular examination: Secondary | ICD-10-CM

## 2011-11-16 DIAGNOSIS — Z01811 Encounter for preprocedural respiratory examination: Secondary | ICD-10-CM

## 2011-11-16 DIAGNOSIS — IMO0001 Reserved for inherently not codable concepts without codable children: Secondary | ICD-10-CM

## 2011-11-16 DIAGNOSIS — M797 Fibromyalgia: Secondary | ICD-10-CM

## 2011-11-16 NOTE — Assessment & Plan Note (Signed)
Blood pressure controlled. Continue present medications. 

## 2011-11-16 NOTE — Patient Instructions (Signed)
Your physician recommends that you schedule a follow-up appointment in:  AS NEEDED PENDING TEST RESULTS  Your physician has requested that you have a lexiscan myoview. For further information please visit [email or web address]. Please follow instruction sheet, as given.   

## 2011-11-16 NOTE — Assessment & Plan Note (Signed)
Management per primary care. 

## 2011-11-16 NOTE — Progress Notes (Signed)
HPI: 77 year old female for preoperative evaluation prior to resection of esophageal cancer. She has no prior cardiac history. She does have dyspnea on exertion but denies orthopnea, PND, pedal edema, exertional chest pain, palpitations or syncope. She has had "chest wall pain" in the past treated with atenolol.     Current Outpatient Prescriptions  Medication Sig Dispense Refill  . acetaminophen (TYLENOL) 650 MG CR tablet Take 650 mg by mouth every 8 (eight) hours as needed.        Marland Kitchen atenolol (TENORMIN) 50 MG tablet Take 50 mg by mouth 2 (two) times daily.       . Calcium-Vitamin D-Vitamin K 500-100-40 MG-UNT-MCG CHEW Chew 1 tablet by mouth 2 (two) times daily.       Marland Kitchen HYDROcodone-acetaminophen (VICODIN) 5-500 MG per tablet Take 1 tablet by mouth 2 (two) times daily.        . predniSONE (DELTASONE) 1 MG tablet 2 tabs po qd      . RABEprazole (ACIPHEX) 20 MG tablet Take 20 mg by mouth daily.        . raloxifene (EVISTA) 60 MG tablet Take 60 mg by mouth daily.        . St Johns Wort 300 MG CAPS Take 300 mg by mouth 2 (two) times daily.        Marland Kitchen tiZANidine (ZANAFLEX) 4 MG capsule Take 4 mg by mouth 1 day or 1 dose.        . traMADol (ULTRAM) 50 MG tablet Take 50 mg by mouth 2 (two) times daily.        . vitamin B-12 (CYANOCOBALAMIN) 1000 MCG tablet Inject 1,000 mcg into the skin every 30 (thirty) days.         Allergies  Allergen Reactions  . Adhesive (Tape)   . Biaxin   . Darvon   . Epinephrine   . Morphine And Related   . Oxycodone   . Oxycontin   . Sulfa Antibiotics   . Vicodin (Hydrocodone-Acetaminophen)     Past Medical History  Diagnosis Date  . Pernicious anemia   . Osteoarthritis   . Fibromyalgia   . Skin lesion     chronic calcific cutaneous lesions  . Adenocarcinoma of gastroesophageal junction   . HX of multiple bleeding ulcers 09/01/2011  . Hypertension     Past Surgical History  Procedure Date  . Left leg fracture   . Miscarriages     G9,P4 four miscarriages  and one set of twins  . Right hip replacement   . Tubal ligation   . Tonsillectomy   . Appendectomy     History   Social History  . Marital Status: Widowed    Spouse Name: N/A    Number of Children: 5  . Years of Education: N/A   Occupational History  . Not on file.   Social History Main Topics  . Smoking status: Former Games developer  . Smokeless tobacco: Never Used  . Alcohol Use: No  . Drug Use: No  . Sexually Active: Not on file   Other Topics Concern  . 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 Cancer Maternal Grandmother     gynecologic  . Cancer Maternal Aunt     gynecologic  . Breast cancer Paternal Aunt   . Breast cancer Paternal Aunt   .  Breast cancer Paternal Aunt     ROS: problems with arthritis, fibromyalgia and hip pain but no fevers or chills, productive cough, hemoptysis, dysphasia, odynophagia, melena, hematochezia, dysuria, hematuria, rash, seizure activity, orthopnea, PND, pedal edema, claudication. Remaining systems are negative.  Physical Exam:  Blood pressure 142/73, pulse 89, height 5\' 4"  (1.626 m), weight 152 lb (68.947 kg).  General:  Well developed/well nourished in NAD Skin warm/dry Patient not depressed No peripheral clubbing Back-normal HEENT-normal/normal eyelids Neck supple/normal carotid upstroke bilaterally; no bruits; no JVD; no thyromegaly chest - diminished breath sounds throughout CV - RRR/normal S1 and S2; no murmurs, rubs or gallops;  PMI nondisplaced Abdomen -NT/ND, no HSM, no mass, + bowel sounds, no bruit 1+ femoral pulses, no bruits Ext-no edema, chords, 1+ DP Neuro-grossly nonfocal  ECG NSR with nonspecific ST changes

## 2011-11-16 NOTE — Progress Notes (Signed)
HPI the patient underwent endoscopy on Monday did have the results. She is given a cardiac clearance today. Her pulmonary function tests showed an FEV1 of 41%. She is a moderate to severe obstructive lung disease. We discussed the risk of the procedure. And we'll await cardiac clearance and the results of the endoscopy performed making a final decision. I will see her back in one week.    Current Outpatient Prescriptions  Medication Sig Dispense Refill  . acetaminophen (TYLENOL) 650 MG CR tablet Take 650 mg by mouth every 8 (eight) hours as needed.        Marland Kitchen atenolol (TENORMIN) 50 MG tablet Take 50 mg by mouth 2 (two) times daily.       . Calcium-Vitamin D-Vitamin K 500-100-40 MG-UNT-MCG CHEW Chew 1 tablet by mouth daily.        . famotidine (PEPCID) 20 MG tablet Take 20 mg by mouth 2 (two) times daily as needed.        Marland Kitchen HYDROcodone-acetaminophen (VICODIN) 5-500 MG per tablet Take 1 tablet by mouth 2 (two) times daily.        . predniSONE (DELTASONE) 1 MG tablet Take 1 mg by mouth 2 (two) times daily.        . RABEprazole (ACIPHEX) 20 MG tablet Take 20 mg by mouth daily.        . raloxifene (EVISTA) 60 MG tablet Take 60 mg by mouth daily.        . St Johns Wort 300 MG CAPS Take 300 mg by mouth 2 (two) times daily.        Marland Kitchen tiZANidine (ZANAFLEX) 4 MG capsule Take 4 mg by mouth 1 day or 1 dose.        . traMADol (ULTRAM) 50 MG tablet Take 50 mg by mouth 2 (two) times daily.        . vitamin B-12 (CYANOCOBALAMIN) 1000 MCG tablet Inject 1,000 mcg into the skin once.           Review of Systems:Eating better otherwise unchanged   Physical ExamLungs were clear to auscultation percussion   Diagnostic Tests:Upper endoscopy results pending  Impression:Esophageal cancer of the distal esophagus   Plan:Return in one week

## 2011-11-16 NOTE — Assessment & Plan Note (Addendum)
Patient describes dyspnea on exertion most likely from COPD although cannot rule out anginal equivalent. She has limited functional capacity because of hip problems. I will proceed with a Lexiscan myoview for risk stratification. If negative she may proceed with surgery. Continue beta blocker perioperatively.

## 2011-11-18 ENCOUNTER — Ambulatory Visit (HOSPITAL_COMMUNITY): Payer: Medicare Other | Attending: Cardiovascular Disease | Admitting: Radiology

## 2011-11-18 VITALS — BP 149/83 | Ht 64.0 in | Wt 151.0 lb

## 2011-11-18 DIAGNOSIS — Z01811 Encounter for preprocedural respiratory examination: Secondary | ICD-10-CM

## 2011-11-18 DIAGNOSIS — R0609 Other forms of dyspnea: Secondary | ICD-10-CM | POA: Insufficient documentation

## 2011-11-18 DIAGNOSIS — Z87891 Personal history of nicotine dependence: Secondary | ICD-10-CM | POA: Insufficient documentation

## 2011-11-18 DIAGNOSIS — C159 Malignant neoplasm of esophagus, unspecified: Secondary | ICD-10-CM | POA: Insufficient documentation

## 2011-11-18 DIAGNOSIS — J438 Other emphysema: Secondary | ICD-10-CM | POA: Insufficient documentation

## 2011-11-18 DIAGNOSIS — R5381 Other malaise: Secondary | ICD-10-CM | POA: Insufficient documentation

## 2011-11-18 DIAGNOSIS — R0989 Other specified symptoms and signs involving the circulatory and respiratory systems: Secondary | ICD-10-CM | POA: Insufficient documentation

## 2011-11-18 DIAGNOSIS — R5383 Other fatigue: Secondary | ICD-10-CM | POA: Insufficient documentation

## 2011-11-18 DIAGNOSIS — I10 Essential (primary) hypertension: Secondary | ICD-10-CM | POA: Insufficient documentation

## 2011-11-18 DIAGNOSIS — R0602 Shortness of breath: Secondary | ICD-10-CM

## 2011-11-18 DIAGNOSIS — Z01810 Encounter for preprocedural cardiovascular examination: Secondary | ICD-10-CM | POA: Insufficient documentation

## 2011-11-18 MED ORDER — REGADENOSON 0.4 MG/5ML IV SOLN
0.4000 mg | Freq: Once | INTRAVENOUS | Status: AC
  Administered 2011-11-18: 0.4 mg via INTRAVENOUS

## 2011-11-18 MED ORDER — TECHNETIUM TC 99M TETROFOSMIN IV KIT
10.0000 | PACK | Freq: Once | INTRAVENOUS | Status: AC | PRN
  Administered 2011-11-18: 10 via INTRAVENOUS

## 2011-11-18 MED ORDER — TECHNETIUM TC 99M TETROFOSMIN IV KIT
30.0000 | PACK | Freq: Once | INTRAVENOUS | Status: AC | PRN
  Administered 2011-11-18: 30 via INTRAVENOUS

## 2011-11-18 NOTE — Progress Notes (Signed)
[facility] SITE 3 NUCLEAR MED [address] [place] [address] [phone]  Cardiology Nuclear Med Study  [name] is a 77 y.o. female [id number] 11/22/1934   Nuclear Med Background Indication for Stress Test:  Evaluation for Ischemia and Pending Surgical Clearance: Resection of esophageal CA History:  Emphysema Cardiac Risk Factors: History of Smoking and Hypertension  Symptoms:  DOE and Fatigue   Nuclear Pre-Procedure Caffeine/Decaff Intake:  None NPO After: 7:00am   Lungs:  Clear IV 0.9% NS with Angio Cath:  20g  IV Site: R Antecubital  IV Started by:  Stanton Kidney, EMT-P  Chest Size (in):  38 Cup Size: B  Height: 5\' 4"  (1.626 m)  Weight:  151 lb (68.493 kg)  BMI:  Body mass index is 25.92 kg/(m^2). Tech Comments:  Tenormin held this am, per patient.    Nuclear Med Study 1 or 2 day study: 1 day  Stress Test Type:  Eugenie Birks  Reading MD: Charlton Haws, MD  Order Authorizing Provider:  Olga Millers, MD  Resting Radionuclide: Technetium 94m Tetrofosmin  Resting Radionuclide Dose: 11.0 mCi   Stress Radionuclide:  Technetium 95m Tetrofosmin  Stress Radionuclide Dose: 33.0 mCi           Stress Protocol Rest HR: 80 Stress HR: 116  Rest BP: 149/83 Stress BP: 145/99  Exercise Time (min): n/a METS: n/a   Predicted Max HR: 145 bpm % Max HR: 80 bpm Rate Pressure Product: 46962   Dose of Adenosine (mg):  n/a Dose of Lexiscan: 0.4 mg  Dose of Atropine (mg): n/a Dose of Dobutamine: n/a mcg/kg/min (at max HR)  Stress Test Technologist: Bonnita Levan, RN  Nuclear Technologist:  Domenic Polite, CNMT     Rest Procedure:  Myocardial perfusion imaging was performed at rest 45 minutes following the intravenous administration of Technetium 55m Tetrofosmin. Rest ECG: NSR with occ. PVC's  Stress Procedure:  The patient received IV Lexiscan 0.4 mg over 15-seconds.  Technetium 100m Tetrofosmin injected at 30-seconds.  There were no significant changes with  Lexiscan.  Quantitative spect images were obtained after a 45 minute delay. Stress ECG: No significant change from baseline ECG  QPS Raw Data Images:  Normal; no motion artifact; normal heart/lung ratio. Stress Images:  Normal homogeneous uptake in all areas of the myocardium. Rest Images:  Normal homogeneous uptake in all areas of the myocardium. Subtraction (SDS):  Normal Transient Ischemic Dilatation (Normal <1.22):  1.00 Lung/Heart Ratio (Normal <0.45):  0.28  Quantitative Gated Spect Images QGS EDV:  67 ml QGS ESV:  20 ml QGS cine images:  NL LV Function; NL Wall Motion QGS EF: 70%  Impression Exercise Capacity:  Lexiscan with no exercise. BP Response:  Normal blood pressure response. Clinical Symptoms:  There is dyspnea. ECG Impression:  No significant ST segment change suggestive of ischemia. Comparison with Prior Nuclear Study: No images to compare  Overall Impression:  Normal stress nuclear study.   Charlton Haws

## 2011-11-22 NOTE — Progress Notes (Signed)
DIAGNOSIS:  Lower esophageal cancer.  [name] has undergone treatment planning for her boost for her esophageal cancer.  I have approved her plan, which will utilize 4 fields.  540 cGy in 3 fractions has been prescribed to her gross disease.  MLCs are being used for custom blocks.  We will use daily cone- beam CT as we have done already for her initial fields.  I have looked at the [facility] and approved this as well during the treatment planning process.    ______________________________ Grayland Jack, M.D. SES/MEDQ  D:  11/15/2011  T:  11/15/2011  Job:  [id number]

## 2011-11-23 ENCOUNTER — Ambulatory Visit (INDEPENDENT_AMBULATORY_CARE_PROVIDER_SITE_OTHER): Payer: Medicare Other | Admitting: Thoracic Surgery

## 2011-11-23 ENCOUNTER — Encounter: Payer: Self-pay | Admitting: Thoracic Surgery

## 2011-11-23 VITALS — BP 140/82 | HR 64 | Resp 20 | Ht 63.0 in | Wt 152.0 lb

## 2011-11-23 DIAGNOSIS — C160 Malignant neoplasm of cardia: Secondary | ICD-10-CM

## 2011-11-23 NOTE — Progress Notes (Signed)
HPI the patient returns today for further discussion of esophagectomy. Her cardiac clearance was by Dr. Jens Som. The results are pending. We discussed the risk and benefits of the procedure. The patient understands the risks and agrees to surgery. She has a lot of narcotic intolerance causing her profound itching. We discussed the shorter long-term side effect of esophagogastrectomy. We discussed the risk of the procedure. We discussed the complications of the procedure including leak and respiratory failure. The patient understands the risk and agrees to the procedure there is a good probability of success.   Current Outpatient Prescriptions  Medication Sig Dispense Refill  . acetaminophen (TYLENOL) 650 MG CR tablet Take 650 mg by mouth every 8 (eight) hours as needed.        Marland Kitchen atenolol (TENORMIN) 50 MG tablet Take 50 mg by mouth 2 (two) times daily.       . Calcium-Vitamin D-Vitamin K 500-100-40 MG-UNT-MCG CHEW Chew 1 tablet by mouth 2 (two) times daily.       Marland Kitchen HYDROcodone-acetaminophen (VICODIN) 5-500 MG per tablet Take 1 tablet by mouth 2 (two) times daily.        . predniSONE (DELTASONE) 1 MG tablet 2 tabs po qd      . RABEprazole (ACIPHEX) 20 MG tablet Take 20 mg by mouth daily.        . raloxifene (EVISTA) 60 MG tablet Take 60 mg by mouth daily.        . St Johns Wort 300 MG CAPS Take 300 mg by mouth 2 (two) times daily.        Marland Kitchen tiZANidine (ZANAFLEX) 4 MG capsule Take 4 mg by mouth 1 day or 1 dose.        . traMADol (ULTRAM) 50 MG tablet Take 50 mg by mouth 2 (two) times daily.        . vitamin B-12 (CYANOCOBALAMIN) 1000 MCG tablet Inject 1,000 mcg into the skin every 30 (thirty) days.          Review of Systems: No change   Physical Exam lungs are clear to auscultation percussion   Diagnostic Tests: Cardiac clearance is pending   Impression: Esophageal cancer status post radiation and chemotherapy   Plan: Plan transhiatal esophagogastrectomy jejunostomy and pyloroplasty on  January 7

## 2011-11-26 ENCOUNTER — Encounter (HOSPITAL_COMMUNITY): Payer: Self-pay

## 2011-12-12 ENCOUNTER — Inpatient Hospital Stay: Admission: AD | Admit: 2011-12-12 | Payer: Self-pay | Source: Ambulatory Visit | Admitting: Thoracic Surgery

## 2011-12-15 ENCOUNTER — Encounter: Payer: Self-pay | Admitting: Thoracic Surgery

## 2011-12-15 ENCOUNTER — Ambulatory Visit (INDEPENDENT_AMBULATORY_CARE_PROVIDER_SITE_OTHER): Payer: Medicare Other | Admitting: Thoracic Surgery

## 2011-12-15 VITALS — BP 143/78 | HR 78 | Ht 63.0 in | Wt 152.0 lb

## 2011-12-15 DIAGNOSIS — B029 Zoster without complications: Secondary | ICD-10-CM

## 2011-12-15 DIAGNOSIS — C160 Malignant neoplasm of cardia: Secondary | ICD-10-CM

## 2011-12-15 NOTE — Progress Notes (Signed)
HPI patient returns for today. She was scheduled for an esophagogastrectomy Monday. Unfortunately she developed herpes zoster. She's been on ancyclovir. The area is in the right T11-12 dermatome. The lesions appeared to be healing with minimal drainage. She is having no pain. We plan to proceed with the esophagogastrectomy on January 14. She will be admitted January 13. Risk of the procedure were explained to the patient and her family. Current Outpatient Prescriptions  Medication Sig Dispense Refill  . atenolol (TENORMIN) 50 MG tablet Take 50 mg by mouth 2 (two) times daily.       . Calcium-Vitamin D-Vitamin K 500-100-40 MG-UNT-MCG CHEW Chew 1 tablet by mouth 2 (two) times daily.       . cyanocobalamin (,VITAMIN B-12,) 1000 MCG/ML injection Inject 1,000 mcg into the muscle every 30 (thirty) days.        . cycloSPORINE (RESTASIS) 0.05 % ophthalmic emulsion Place 1 drop into both eyes 2 (two) times daily.        . diphenhydrAMINE (BENADRYL) 25 MG tablet Take 25 mg by mouth 2 (two) times daily as needed. Pt takes with Norco.       . fluorometholone (FML) 0.1 % ophthalmic suspension Place 1 drop into both ears as needed. For ear ache.       . HYDROcodone-acetaminophen (NORCO) 7.5-325 MG per tablet Take 1 tablet by mouth 2 (two) times daily.        . predniSONE (DELTASONE) 1 MG tablet Take 1 mg by mouth 2 (two) times daily.        . RABEprazole (ACIPHEX) 20 MG tablet Take 20 mg by mouth daily.        . raloxifene (EVISTA) 60 MG tablet Take 60 mg by mouth daily.        . St Johns Wort 300 MG CAPS Take 300 mg by mouth 2 (two) times daily.        . tiZANidine (ZANAFLEX) 4 MG tablet Take 2 mg by mouth 2 (two) times daily.        . traMADol (ULTRAM) 50 MG tablet Take 100 mg by mouth 2 (two) times daily.       . valACYclovir (VALTREX) 1000 MG tablet Take 1,000 mg by mouth 2 (two) times daily.         Review of Systems: Recent herpes zoster right T11-12 dermatome   Physical Exam lungs are clear  attestation percussion resolving herpes zoster right abdomen   Diagnostic Tests: None   Impression: Stage IIIa esophageal cancer status post radiation and chemotherapy   Plan: The esophagogastrectomy January 14      

## 2011-12-17 ENCOUNTER — Encounter (HOSPITAL_COMMUNITY): Payer: Self-pay | Admitting: *Deleted

## 2011-12-17 NOTE — Progress Notes (Signed)
Stress test done and in epic  Never had an echo   Medical MD maintains HTN-Dr.Robert Foy [place] in [place]

## 2011-12-19 ENCOUNTER — Inpatient Hospital Stay (HOSPITAL_COMMUNITY): Payer: Medicare Other

## 2011-12-19 ENCOUNTER — Other Ambulatory Visit: Payer: Self-pay

## 2011-12-19 ENCOUNTER — Inpatient Hospital Stay (HOSPITAL_COMMUNITY)
Admission: RE | Admit: 2011-12-19 | Discharge: 2012-01-03 | DRG: 327 | Disposition: A | Discharge status: Skilled Nursing Facility | Payer: Medicare Other | Source: Ambulatory Visit | Attending: Thoracic Surgery | Admitting: Thoracic Surgery

## 2011-12-19 DIAGNOSIS — I10 Essential (primary) hypertension: Secondary | ICD-10-CM | POA: Diagnosis present

## 2011-12-19 DIAGNOSIS — Z882 Allergy status to sulfonamides status: Secondary | ICD-10-CM

## 2011-12-19 DIAGNOSIS — M810 Age-related osteoporosis without current pathological fracture: Secondary | ICD-10-CM | POA: Diagnosis present

## 2011-12-19 DIAGNOSIS — R1310 Dysphagia, unspecified: Secondary | ICD-10-CM | POA: Diagnosis present

## 2011-12-19 DIAGNOSIS — K5730 Diverticulosis of large intestine without perforation or abscess without bleeding: Secondary | ICD-10-CM | POA: Diagnosis present

## 2011-12-19 DIAGNOSIS — Z8711 Personal history of peptic ulcer disease: Secondary | ICD-10-CM

## 2011-12-19 DIAGNOSIS — Z860100 Personal history of colon polyps, unspecified: Secondary | ICD-10-CM

## 2011-12-19 DIAGNOSIS — Z79899 Other long term (current) drug therapy: Secondary | ICD-10-CM

## 2011-12-19 DIAGNOSIS — K219 Gastro-esophageal reflux disease without esophagitis: Secondary | ICD-10-CM | POA: Diagnosis present

## 2011-12-19 DIAGNOSIS — Z923 Personal history of irradiation: Secondary | ICD-10-CM

## 2011-12-19 DIAGNOSIS — J438 Other emphysema: Secondary | ICD-10-CM | POA: Diagnosis present

## 2011-12-19 DIAGNOSIS — Z9221 Personal history of antineoplastic chemotherapy: Secondary | ICD-10-CM

## 2011-12-19 DIAGNOSIS — D62 Acute posthemorrhagic anemia: Secondary | ICD-10-CM | POA: Diagnosis not present

## 2011-12-19 DIAGNOSIS — D510 Vitamin B12 deficiency anemia due to intrinsic factor deficiency: Secondary | ICD-10-CM | POA: Diagnosis present

## 2011-12-19 DIAGNOSIS — C160 Malignant neoplasm of cardia: Principal | ICD-10-CM | POA: Diagnosis present

## 2011-12-19 DIAGNOSIS — Z87891 Personal history of nicotine dependence: Secondary | ICD-10-CM

## 2011-12-19 DIAGNOSIS — R5381 Other malaise: Secondary | ICD-10-CM | POA: Diagnosis present

## 2011-12-19 DIAGNOSIS — Z87442 Personal history of urinary calculi: Secondary | ICD-10-CM

## 2011-12-19 DIAGNOSIS — IMO0001 Reserved for inherently not codable concepts without codable children: Secondary | ICD-10-CM | POA: Diagnosis present

## 2011-12-19 DIAGNOSIS — Z888 Allergy status to other drugs, medicaments and biological substances status: Secondary | ICD-10-CM

## 2011-12-19 DIAGNOSIS — Z96649 Presence of unspecified artificial hip joint: Secondary | ICD-10-CM

## 2011-12-19 DIAGNOSIS — M1990 Unspecified osteoarthritis, unspecified site: Secondary | ICD-10-CM | POA: Diagnosis present

## 2011-12-19 HISTORY — DX: Psoriasis, unspecified: L40.9

## 2011-12-19 HISTORY — DX: Vitamin B deficiency, unspecified: E53.9

## 2011-12-19 HISTORY — DX: Gastro-esophageal reflux disease without esophagitis: K21.9

## 2011-12-19 HISTORY — DX: Dizziness and giddiness: R42

## 2011-12-19 HISTORY — DX: Zoster without complications: B02.9

## 2011-12-19 HISTORY — DX: Diverticulosis of intestine, part unspecified, without perforation or abscess without bleeding: K57.90

## 2011-12-19 HISTORY — DX: Personal history of colon polyps, unspecified: Z86.0100

## 2011-12-19 HISTORY — DX: Calculus of kidney: N20.0

## 2011-12-19 HISTORY — DX: Personal history of other diseases of the digestive system: Z87.19

## 2011-12-19 HISTORY — DX: Scoliosis, unspecified: M41.9

## 2011-12-19 HISTORY — DX: Personal history of colonic polyps: Z86.010

## 2011-12-19 LAB — COMPREHENSIVE METABOLIC PANEL
AST: 15 U/L (ref 0–37)
Albumin: 3.9 g/dL (ref 3.5–5.2)
BUN: 18 mg/dL (ref 6–23)
Creatinine, Ser: 0.99 mg/dL (ref 0.50–1.10)
Potassium: 4 mEq/L (ref 3.5–5.1)
Total Protein: 7.3 g/dL (ref 6.0–8.3)

## 2011-12-19 LAB — CBC
Hemoglobin: 11.4 g/dL — ABNORMAL LOW (ref 12.0–15.0)
MCH: 29.7 pg (ref 26.0–34.0)
MCHC: 31.8 g/dL (ref 30.0–36.0)
MCV: 93.2 fL (ref 78.0–100.0)
Platelets: 254 10*3/uL (ref 150–400)
RBC: 3.84 MIL/uL — ABNORMAL LOW (ref 3.87–5.11)

## 2011-12-19 LAB — TYPE AND SCREEN: Unit division: 0

## 2011-12-19 LAB — BLOOD GAS, ARTERIAL
Bicarbonate: 21.3 mEq/L (ref 20.0–24.0)
FIO2: 0.21 %
TCO2: 22.4 mmol/L (ref 0–100)
pH, Arterial: 7.391 (ref 7.350–7.400)
pO2, Arterial: 80.2 mmHg (ref 80.0–100.0)

## 2011-12-19 LAB — APTT: aPTT: 30 seconds (ref 24–37)

## 2011-12-19 LAB — PROTIME-INR: Prothrombin Time: 13.7 seconds (ref 11.6–15.2)

## 2011-12-19 MED ORDER — DIPHENHYDRAMINE HCL 25 MG PO CAPS: 25.0000 mg | ORAL_CAPSULE | Freq: Four times a day (QID) | ORAL | Status: DC | PRN

## 2011-12-19 MED ORDER — TRAMADOL HCL 50 MG PO TABS: 50.0000 mg | ORAL_TABLET | Freq: Four times a day (QID) | ORAL | Status: DC | PRN

## 2011-12-19 MED ORDER — HYDROCODONE-ACETAMINOPHEN 7.5-325 MG PO TABS: 1.0000 | ORAL_TABLET | Freq: Three times a day (TID) | ORAL | Status: DC | PRN

## 2011-12-19 MED ORDER — MAGNESIUM CITRATE PO SOLN
1.0000 | Freq: Once | ORAL | Status: AC
  Administered 2011-12-19: 1 via ORAL
  Filled 2011-12-19: qty 296

## 2011-12-19 MED ORDER — ACETAMINOPHEN 325 MG PO TABS: 650.0000 mg | ORAL_TABLET | Freq: Four times a day (QID) | ORAL | Status: DC | PRN

## 2011-12-19 MED ORDER — DEXTROSE 5 % IV SOLN
1.5000 g | INTRAVENOUS | Status: DC
  Filled 2011-12-19: qty 1.5

## 2011-12-19 MED ORDER — CHLORHEXIDINE GLUCONATE 4 % EX LIQD
Freq: Once | CUTANEOUS | Status: AC
  Administered 2011-12-20: 01:00:00 via TOPICAL
  Filled 2011-12-19: qty 15

## 2011-12-19 MED ORDER — RALOXIFENE HCL 60 MG PO TABS
60.0000 mg | ORAL_TABLET | Freq: Every day | ORAL | Status: DC
  Filled 2011-12-19: qty 1

## 2011-12-19 MED ORDER — HYDROCODONE-ACETAMINOPHEN 5-325 MG PO TABS
1.5000 | ORAL_TABLET | Freq: Three times a day (TID) | ORAL | Status: DC | PRN
  Administered 2011-12-19: 1 via ORAL
  Filled 2011-12-19: qty 2

## 2011-12-19 MED ORDER — HYDROCODONE-ACETAMINOPHEN 7.5-325 MG PO TABS: 1.0000 | ORAL_TABLET | Freq: Four times a day (QID) | ORAL | Status: DC | PRN

## 2011-12-19 MED ORDER — PREDNISONE 1 MG PO TABS
1.0000 mg | ORAL_TABLET | Freq: Two times a day (BID) | ORAL | Status: DC
  Filled 2011-12-19 (×2): qty 1

## 2011-12-19 MED ORDER — TIZANIDINE HCL 2 MG PO TABS
2.0000 mg | ORAL_TABLET | Freq: Two times a day (BID) | ORAL | Status: DC
  Administered 2011-12-19: 2 mg via ORAL
  Filled 2011-12-19 (×3): qty 1

## 2011-12-19 MED ORDER — PROMETHAZINE HCL 25 MG/ML IJ SOLN: 12.5000 mg | Freq: Four times a day (QID) | INTRAMUSCULAR | Status: DC | PRN

## 2011-12-19 MED ORDER — DEXTROSE-NACL 5-0.9 % IV SOLN
INTRAVENOUS | Status: DC
  Administered 2011-12-19: 19:00:00 via INTRAVENOUS

## 2011-12-19 MED ORDER — ONDANSETRON HCL 4 MG/2ML IJ SOLN: 4.0000 mg | Freq: Four times a day (QID) | INTRAMUSCULAR | Status: DC | PRN

## 2011-12-19 MED ORDER — VALACYCLOVIR HCL 500 MG PO TABS
1000.0000 mg | ORAL_TABLET | Freq: Two times a day (BID) | ORAL | Status: DC
  Administered 2011-12-19: 1000 mg via ORAL
  Filled 2011-12-19 (×3): qty 2

## 2011-12-19 MED ORDER — DIPHENHYDRAMINE HCL 25 MG PO CAPS
25.0000 mg | ORAL_CAPSULE | Freq: Three times a day (TID) | ORAL | Status: DC | PRN
  Filled 2011-12-19: qty 1

## 2011-12-19 MED ORDER — PANTOPRAZOLE SODIUM 40 MG PO TBEC: 40.0000 mg | DELAYED_RELEASE_TABLET | Freq: Every day | ORAL | Status: DC

## 2011-12-19 MED ORDER — ACETAMINOPHEN 650 MG RE SUPP: 650.0000 mg | Freq: Four times a day (QID) | RECTAL | Status: DC | PRN

## 2011-12-19 MED ORDER — ONDANSETRON HCL 4 MG PO TABS: 4.0000 mg | ORAL_TABLET | Freq: Four times a day (QID) | ORAL | Status: DC | PRN

## 2011-12-19 MED ORDER — CYCLOSPORINE 0.05 % OP EMUL
1.0000 [drp] | Freq: Two times a day (BID) | OPHTHALMIC | Status: DC
  Administered 2011-12-19: 21:00:00 via OPHTHALMIC
  Administered 2011-12-20 – 2012-01-03 (×26): 1 [drp] via OPHTHALMIC
  Filled 2011-12-19 (×39): qty 1

## 2011-12-19 MED ORDER — ATENOLOL 50 MG PO TABS
50.0000 mg | ORAL_TABLET | Freq: Two times a day (BID) | ORAL | Status: DC
  Administered 2011-12-19: 50 mg via ORAL
  Filled 2011-12-19 (×3): qty 1

## 2011-12-19 NOTE — H&P (Signed)
[name] is an 78 y.o. female. [birth year] ZOX:[id number]   Chief Complaint: Esophageal Cancer  HPI: This is 78 year old Caucasian female who was found to have esophageal cancer Stage III (adenocarcinoma) . She underwent radiation and chemotherapy. She was initially scheduled to undergo a transhiatal esophagectomy by Dr. Edwyna Shell last Monday;however, she developed Herpes Zoster along dermatome T11-12. She has been treated with Valacyclovir.She was last seen in the office by Dr. Edwyna Shell on 12/15/2011. Her rash was improving.  She also apparently received cardiac clearance from Dr. Jens Som. She presents to [facility] to undergo the aforementioned surgery.  Past Medical History  Diagnosis Date  . Pernicious anemia   . Osteoarthritis   . Fibromyalgia   . Skin lesion     chronic calcific cutaneous lesions  . HX of multiple bleeding ulcers 09/01/2011  . Hypertension     takes Atenolol daily  . Emphysema   . Shortness of breath     with exertion  . Bronchitis     hx  . Adenocarcinoma of gastroesophageal junction   . Dizziness     d/t crystals in ears that "roll around"and can't get them out  . Back pain   . Osteoporosis   . Scoliosis   . Psoriasis   . H/O hiatal hernia   . GERD (gastroesophageal reflux disease)     takes Aciphex daily  . Gastric ulcer   . Constipation     since Chemo and Radiation;finished up in Nov 2012  . Diverticulosis   . History of colonic polyps   . Shingles     broke out 2wks ago;has been on meds and to finish up tomorrow  . Kidney stone 25+yrs ago  . Pernicious anemia   . Vitamin B deficiency     Past Surgical History  Procedure Date  . Right hip replacement 2001  . Tonsillectomy     as a child  . Tubal ligation 1975  . Appendectomy 1975  . Cataract surgery 10+yrs ago  . Esophagogastroduodenoscopy   . Colonoscopy     Family History  Problem Relation Age of Onset  . Cancer Maternal Grandmother     gynecologic  . Cancer Maternal Aunt    gynecologic  . Breast cancer Paternal Aunt   . Breast cancer Paternal Aunt   . Breast cancer Paternal Aunt   . Anesthesia problems Neg Hx   . Hypotension Neg Hx   . Malignant hyperthermia Neg Hx   . Pseudochol deficiency Neg Hx    Social History:  Reports that she has quit smoking. She has never used smokeless tobacco. She reports that she does not drink alcohol or use illicit drugs.  Allergies:  Allergies  Allergen Reactions  . Adhesive (Tape) Itching  . Biaxin Itching  . Darvon Itching  . Epinephrine Itching  . Morphine And Related Itching  . Oxycodone Itching  . Oxycontin Itching  . Sulfa Antibiotics Itching  . Vicodin (Hydrocodone-Acetaminophen) Itching   Medications Prior to Admission:  atenolol (TENORMIN) 50 MG tablet  Take 50 mg by mouth 2 (two) times daily.     .  Calcium-Vitamin D-Vitamin K 500-100-40 MG-UNT-MCG CHEW  Chew 1 tablet by mouth 2 (two) times daily.     .  cyanocobalamin (,VITAMIN B-12,) 1000 MCG/ML injection  Inject 1,000 mcg into the muscle every 30 (thirty) days.     .  cycloSPORINE (RESTASIS) 0.05 % ophthalmic emulsion  Place 1 drop into both eyes 2 (two) times daily.     Marland Kitchen  diphenhydrAMINE (BENADRYL) 25 MG tablet  Take 25 mg by mouth 2 (two) times daily as needed. Pt takes with Norco.     .  fluorometholone (FML) 0.1 % ophthalmic suspension  Place 1 drop into both ears as needed. For ear ache.     Marland Kitchen  HYDROcodone-acetaminophen (NORCO) 7.5-325 MG per tablet  Take 1 tablet by mouth 2 (two) times daily.     .  predniSONE (DELTASONE) 1 MG tablet  Take 1 mg by mouth 2 (two) times daily.     .  RABEprazole (ACIPHEX) 20 MG tablet  Take 20 mg by mouth daily.     .  raloxifene (EVISTA) 60 MG tablet  Take 60 mg by mouth daily.     .  St Johns Wort 300 MG CAPS  Take 300 mg by mouth 2 (two) times daily.     Marland Kitchen  tiZANidine (ZANAFLEX) 4 MG tablet  Take 2 mg by mouth 2 (two) times daily.     .  traMADol (ULTRAM) 50 MG tablet  Take 100 mg by mouth 2 (two) times daily.       .  valACYclovir (VALTREX) 1000 MG tablet  Take 1,000 mg by mouth 2 (two) times daily.      No results found for this or any previous visit (from the past 48 hour(s)). No results found.  Review of Systems: Positive for herpes zoster T11-12  right dermatome, nausea (especially after chemotherapy)arthritis, and hip pain.She denies fever, chills, productive cough, hemoptysis, melena or hematochezia, dysuria, seizure, PND.  Blood pressure 153/80, pulse 79, temperature 97.5 F (36.4 C), temperature source Oral, resp. rate 16, height 5\' 4"  (1.626 m), weight 150 lb (68.04 kg), SpO2 94.00%.  Physical Examination: HEENT:Atramatic, normocephalic. EOMI intact and PERRLA. Sclera non icteric.Neck is supple, no JVD.  Cardiovascular: RRR;no murmurs, gallops, or rub. Pulmonary:Clear to auscultation bilaterally;no rales, wheezes,or rhonchi. Abdomen:Soft, non tender, bowel sounds present,no rebound or guarding. Extremities:Warm;no cyanosis, clubbing, or edema.Herpes Zoster rash minor and without any weeping vesicles. Palpable 2+ DP/PT bilaterally. Neurologic:Grossly intact without any focal deficits  Assessment/Plan Esophageal cancer (stage III;s/p chemotherapy and radiation). Will undergo transhiatal esophagectomy by Dr. Edwyna Shell on 12/20/2011.It should be noted that patient has been cleared by a cardiologist (Dr. Jens Som)  Ardelle Balls, PA-C 12/19/2011, 4:15 PM

## 2011-12-20 ENCOUNTER — Inpatient Hospital Stay (HOSPITAL_COMMUNITY): Payer: Medicare Other

## 2011-12-20 ENCOUNTER — Encounter (HOSPITAL_COMMUNITY): Payer: Self-pay | Admitting: Certified Registered Nurse Anesthetist

## 2011-12-20 ENCOUNTER — Other Ambulatory Visit: Payer: Self-pay | Admitting: Thoracic Surgery

## 2011-12-20 ENCOUNTER — Encounter (HOSPITAL_COMMUNITY): Payer: Self-pay | Admitting: Radiology

## 2011-12-20 ENCOUNTER — Encounter (HOSPITAL_COMMUNITY)
Admission: RE | Disposition: A | Discharge status: Skilled Nursing Facility | Payer: Self-pay | Source: Ambulatory Visit | Attending: Thoracic Surgery

## 2011-12-20 ENCOUNTER — Inpatient Hospital Stay (HOSPITAL_COMMUNITY): Payer: Medicare Other | Admitting: Certified Registered Nurse Anesthetist

## 2011-12-20 DIAGNOSIS — C159 Malignant neoplasm of esophagus, unspecified: Secondary | ICD-10-CM

## 2011-12-20 HISTORY — PX: PARTIAL ESOPHAGECTOMY: SHX5287

## 2011-12-20 LAB — URINALYSIS, ROUTINE W REFLEX MICROSCOPIC
Leukocytes, UA: NEGATIVE
Nitrite: NEGATIVE
Protein, ur: NEGATIVE mg/dL
Specific Gravity, Urine: 1.014 (ref 1.005–1.030)
Urobilinogen, UA: 0.2 mg/dL (ref 0.0–1.0)

## 2011-12-20 LAB — CBC
HCT: 29.2 % — ABNORMAL LOW (ref 36.0–46.0)
Hemoglobin: 9.3 g/dL — ABNORMAL LOW (ref 12.0–15.0)
MCH: 29.8 pg (ref 26.0–34.0)
MCHC: 31.8 g/dL (ref 30.0–36.0)
MCV: 93.6 fL (ref 78.0–100.0)

## 2011-12-20 LAB — POCT I-STAT 3, ART BLOOD GAS (G3+)
Acid-base deficit: 1 mmol/L (ref 0.0–2.0)
Acid-base deficit: 5 mmol/L — ABNORMAL HIGH (ref 0.0–2.0)
O2 Saturation: 99 %
O2 Saturation: 98 %
pCO2 arterial: 36.6 mmHg (ref 35.0–45.0)
pCO2 arterial: 39.1 mmHg (ref 35.0–45.0)
pH, Arterial: 7.35 (ref 7.350–7.400)
pO2, Arterial: 158 mmHg — ABNORMAL HIGH (ref 80.0–100.0)

## 2011-12-20 LAB — BASIC METABOLIC PANEL
BUN: 14 mg/dL (ref 6–23)
Creatinine, Ser: 0.8 mg/dL (ref 0.50–1.10)
GFR calc non Af Amer: 70 mL/min — ABNORMAL LOW (ref >90)
Glucose, Bld: 128 mg/dL — ABNORMAL HIGH (ref 70–99)
Potassium: 3.9 mEq/L (ref 3.5–5.1)

## 2011-12-20 SURGERY — ESOPHAGECTOMY, PARTIAL
Anesthesia: General | Site: Abdomen | Wound class: Clean Contaminated

## 2011-12-20 MED ORDER — HYDRALAZINE HCL 20 MG/ML IJ SOLN
20.0000 mg | Freq: Four times a day (QID) | INTRAMUSCULAR | Status: AC | PRN
  Filled 2011-12-20: qty 1

## 2011-12-20 MED ORDER — MIDAZOLAM BOLUS VIA INFUSION: 1.0000 mg | INTRAVENOUS | Status: DC | PRN

## 2011-12-20 MED ORDER — ONDANSETRON HCL 4 MG/2ML IJ SOLN: 4.0000 mg | Freq: Four times a day (QID) | INTRAMUSCULAR | Status: DC | PRN

## 2011-12-20 MED ORDER — ALBUMIN HUMAN 5 % IV SOLN
INTRAVENOUS | Status: DC | PRN
  Administered 2011-12-20: 10:00:00 via INTRAVENOUS

## 2011-12-20 MED ORDER — ONDANSETRON HCL 4 MG/2ML IJ SOLN
4.0000 mg | INTRAMUSCULAR | Status: DC | PRN
  Administered 2012-01-02: 4 mg via INTRAVENOUS
  Filled 2011-12-20: qty 2

## 2011-12-20 MED ORDER — FIBRIN SEALANT COMPONENT 5 ML EX KIT
PACK | CUTANEOUS | Status: DC | PRN
  Administered 2011-12-20: 5 mL

## 2011-12-20 MED ORDER — MIDAZOLAM HCL 2 MG/2ML IJ SOLN: 1.0000 mg | INTRAMUSCULAR | Status: DC | PRN

## 2011-12-20 MED ORDER — 0.9 % SODIUM CHLORIDE (POUR BTL) OPTIME
TOPICAL | Status: DC | PRN
  Administered 2011-12-20: 3000 mL

## 2011-12-20 MED ORDER — KCL IN DEXTROSE-NACL 20-5-0.45 MEQ/L-%-% IV SOLN
INTRAVENOUS | Status: DC
  Administered 2011-12-20: 125 mL via INTRAVENOUS
  Administered 2011-12-21 – 2011-12-22 (×3): via INTRAVENOUS
  Filled 2011-12-20 (×7): qty 1000

## 2011-12-20 MED ORDER — PROPOFOL 10 MG/ML IV EMUL
5.0000 ug/kg/min | INTRAVENOUS | Status: DC
  Filled 2011-12-20: qty 100

## 2011-12-20 MED ORDER — PHENYLEPHRINE HCL 10 MG/ML IJ SOLN
INTRAMUSCULAR | Status: DC | PRN
  Administered 2011-12-20 (×3): 40 ug via INTRAVENOUS
  Administered 2011-12-20 (×2): 80 ug via INTRAVENOUS
  Administered 2011-12-20 (×2): 40 ug via INTRAVENOUS
  Administered 2011-12-20: 80 ug via INTRAVENOUS
  Administered 2011-12-20 (×6): 40 ug via INTRAVENOUS
  Administered 2011-12-20: 80 ug via INTRAVENOUS

## 2011-12-20 MED ORDER — PROPOFOL 10 MG/ML IV EMUL
INTRAVENOUS | Status: DC | PRN
  Administered 2011-12-20: 140 mg via INTRAVENOUS

## 2011-12-20 MED ORDER — VECURONIUM BROMIDE 10 MG IV SOLR
INTRAVENOUS | Status: DC | PRN
  Administered 2011-12-20: 6 mg via INTRAVENOUS
  Administered 2011-12-20 (×2): 2 mg via INTRAVENOUS

## 2011-12-20 MED ORDER — FENTANYL CITRATE 0.05 MG/ML IJ SOLN
50.0000 ug | Freq: Four times a day (QID) | INTRAMUSCULAR | Status: DC | PRN
  Administered 2011-12-20 – 2011-12-21 (×6): 50 ug via INTRAVENOUS
  Filled 2011-12-20 (×6): qty 2

## 2011-12-20 MED ORDER — ROCURONIUM BROMIDE 100 MG/10ML IV SOLN
INTRAVENOUS | Status: DC | PRN
  Administered 2011-12-20: 50 mg via INTRAVENOUS

## 2011-12-20 MED ORDER — HYDROMORPHONE HCL PF 1 MG/ML IJ SOLN
0.2500 mg | INTRAMUSCULAR | Status: DC | PRN
  Administered 2011-12-20: 0.5 mg via INTRAVENOUS
  Filled 2011-12-20: qty 1

## 2011-12-20 MED ORDER — FENTANYL BOLUS VIA INFUSION: 50.0000 ug | Freq: Four times a day (QID) | INTRAVENOUS | Status: DC | PRN

## 2011-12-20 MED ORDER — FENTANYL BOLUS VIA INFUSION
50.0000 ug | Freq: Four times a day (QID) | INTRAVENOUS | Status: DC | PRN
  Filled 2011-12-20: qty 100

## 2011-12-20 MED ORDER — ACETAMINOPHEN 10 MG/ML IV SOLN
1000.0000 mg | Freq: Four times a day (QID) | INTRAVENOUS | Status: AC
  Administered 2011-12-20 – 2011-12-21 (×4): 1000 mg via INTRAVENOUS
  Filled 2011-12-20 (×4): qty 100

## 2011-12-20 MED ORDER — FENTANYL CITRATE 0.05 MG/ML IJ SOLN
INTRAMUSCULAR | Status: DC | PRN
  Administered 2011-12-20 (×5): 50 ug via INTRAVENOUS
  Administered 2011-12-20: 100 ug via INTRAVENOUS
  Administered 2011-12-20 (×2): 50 ug via INTRAVENOUS
  Administered 2011-12-20: 100 ug via INTRAVENOUS
  Administered 2011-12-20 (×2): 50 ug via INTRAVENOUS
  Administered 2011-12-20: 100 ug via INTRAVENOUS
  Administered 2011-12-20 (×2): 50 ug via INTRAVENOUS

## 2011-12-20 MED ORDER — PIPERACILLIN-TAZOBACTAM 3.375 G IVPB
3.3750 g | Freq: Three times a day (TID) | INTRAVENOUS | Status: DC
  Administered 2011-12-20 – 2011-12-22 (×6): 3.375 g via INTRAVENOUS
  Filled 2011-12-20 (×6): qty 50

## 2011-12-20 MED ORDER — PIPERACILLIN-TAZOBACTAM 3.375 G IVPB
3.3750 g | INTRAVENOUS | Status: AC
  Administered 2011-12-20: 3.375 g via INTRAVENOUS
  Filled 2011-12-20: qty 50

## 2011-12-20 MED ORDER — PANTOPRAZOLE SODIUM 40 MG IV SOLR
40.0000 mg | Freq: Two times a day (BID) | INTRAVENOUS | Status: DC
  Administered 2011-12-20 – 2012-01-03 (×29): 40 mg via INTRAVENOUS
  Filled 2011-12-20 (×35): qty 40

## 2011-12-20 MED ORDER — HYDRALAZINE HCL 20 MG/ML IJ SOLN
20.0000 mg | INTRAMUSCULAR | Status: AC
  Administered 2011-12-20: 20 mg via INTRAVENOUS
  Filled 2011-12-20: qty 1

## 2011-12-20 MED ORDER — SODIUM CHLORIDE 0.9 % IV SOLN: 50.0000 ug/h | INTRAVENOUS | Status: DC

## 2011-12-20 MED ORDER — METOPROLOL TARTRATE 1 MG/ML IV SOLN
INTRAVENOUS | Status: AC
  Filled 2011-12-20: qty 5

## 2011-12-20 MED ORDER — MIDAZOLAM HCL 5 MG/5ML IJ SOLN
INTRAMUSCULAR | Status: DC | PRN
  Administered 2011-12-20 (×2): 1 mg via INTRAVENOUS

## 2011-12-20 MED ORDER — SODIUM CHLORIDE 0.9 % IV SOLN
10.0000 ug/h | INTRAVENOUS | Status: DC
  Filled 2011-12-20: qty 50

## 2011-12-20 MED ORDER — LACTATED RINGERS IV SOLN
INTRAVENOUS | Status: DC | PRN
  Administered 2011-12-20 (×3): via INTRAVENOUS

## 2011-12-20 MED ORDER — POTASSIUM CHLORIDE 10 MEQ/50ML IV SOLN
10.0000 meq | INTRAVENOUS | Status: DC | PRN
  Administered 2011-12-23 (×3): 10 meq via INTRAVENOUS
  Filled 2011-12-20: qty 150

## 2011-12-20 MED ORDER — ALBUTEROL SULFATE (5 MG/ML) 0.5% IN NEBU
2.5000 mg | INHALATION_SOLUTION | Freq: Four times a day (QID) | RESPIRATORY_TRACT | Status: DC
  Administered 2011-12-20 – 2011-12-24 (×15): 2.5 mg via RESPIRATORY_TRACT
  Filled 2011-12-20 (×16): qty 0.5

## 2011-12-20 MED ORDER — SODIUM CHLORIDE 0.9 % IJ SOLN
10.0000 mL | Freq: Two times a day (BID) | INTRAMUSCULAR | Status: DC
  Administered 2011-12-20 – 2011-12-31 (×22): 10 mL via INTRAVENOUS
  Filled 2011-12-20 (×3): qty 10

## 2011-12-20 MED ORDER — SODIUM CHLORIDE 0.9 % IV SOLN: 2.0000 mg/h | INTRAVENOUS | Status: DC

## 2011-12-20 SURGICAL SUPPLY — 113 items
ADH SKN CLS APL DERMABOND .7 (GAUZE/BANDAGES/DRESSINGS) ×1
APPLICATOR COTTON TIP 6IN STRL (MISCELLANEOUS) IMPLANT
APPLIER CLIP ROT 10 11.4 M/L (STAPLE) ×2
APR CLP MED LRG 11.4X10 (STAPLE) ×1
CANISTER SUCTION 2500CC (MISCELLANEOUS) ×2 IMPLANT
CATH FOLEY 2WAY SLVR 18FR 30CC (CATHETERS) IMPLANT
CATH FOLEY 2WAY SLVR 30CC 16FR (CATHETERS) IMPLANT
CATH FOLEY 2WAY SLVR 30CC 22FR (CATHETERS) IMPLANT
CATH FOLEY SILVER 30CC 30FR (CATHETERS) ×2 IMPLANT
CATH HYDRAGLIDE XL THORACIC (CATHETERS) IMPLANT
CATH ROBINSON RED A/P 16FR (CATHETERS) ×2 IMPLANT
CATH THORACIC 28FR (CATHETERS) IMPLANT
CATH THORACIC 36FR (CATHETERS) IMPLANT
CATH THORACIC 36FR RT ANG (CATHETERS) IMPLANT
CLIP APPLIE ROT 10 11.4 M/L (STAPLE) ×1 IMPLANT
CLIP TI LARGE 6 (CLIP) IMPLANT
CLIP TI MEDIUM 24 (CLIP) ×2 IMPLANT
CLIP TI MEDIUM 6 (CLIP) ×2 IMPLANT
CLIP TI WIDE RED SMALL 24 (CLIP) ×2 IMPLANT
CLOTH BEACON ORANGE TIMEOUT ST (SAFETY) ×2 IMPLANT
CONT SPEC 4OZ CLIKSEAL STRL BL (MISCELLANEOUS) ×4 IMPLANT
COVER SURGICAL LIGHT HANDLE (MISCELLANEOUS) ×4 IMPLANT
CUTTER LINEAR ENDO 35 ART FLEX (STAPLE) ×2 IMPLANT
DERMABOND ADVANCED (GAUZE/BANDAGES/DRESSINGS) ×1
DERMABOND ADVANCED .7 DNX12 (GAUZE/BANDAGES/DRESSINGS) ×1 IMPLANT
DRAIN CHANNEL 19F RND (DRAIN) ×2 IMPLANT
DRAIN PENROSE 1/2X36 STERILE (WOUND CARE) ×4 IMPLANT
DRAIN SNY 10X20 3/4 PERF (WOUND CARE) ×2 IMPLANT
DRAIN SUMP SARATOGA 24F (WOUND CARE) IMPLANT
DRAPE CAMERA VIDEO/LASER (DRAPES) ×2 IMPLANT
DRAPE INCISE IOBAN 66X45 STRL (DRAPES) ×2 IMPLANT
DRAPE LAPAROSCOPIC ABDOMINAL (DRAPES) ×2 IMPLANT
DRAPE WARM FLUID 44X44 (DRAPE) ×2 IMPLANT
DRSG COVADERM 4X14 (GAUZE/BANDAGES/DRESSINGS) ×2 IMPLANT
DRSG COVADERM 4X8 (GAUZE/BANDAGES/DRESSINGS) ×2 IMPLANT
ELECT BLADE 6.5 EXT (BLADE) ×2 IMPLANT
ELECT REM PT RETURN 9FT ADLT (ELECTROSURGICAL) ×2
ELECTRODE REM PT RTRN 9FT ADLT (ELECTROSURGICAL) ×1 IMPLANT
EVACUATOR SILICONE 100CC (DRAIN) ×2 IMPLANT
GAUZE SPONGE 4X4 16PLY XRAY LF (GAUZE/BANDAGES/DRESSINGS) IMPLANT
GLOVE BIO SURGEON STRL SZ 6 (GLOVE) ×6 IMPLANT
GLOVE BIO SURGEON STRL SZ 6.5 (GLOVE) ×2 IMPLANT
GLOVE BIOGEL PI IND STRL 6.5 (GLOVE) ×3 IMPLANT
GLOVE BIOGEL PI INDICATOR 6.5 (GLOVE) ×3
GLOVE ECLIPSE 6.0 STRL STRAW (GLOVE) ×6 IMPLANT
GLOVE SURG SIGNA 7.5 PF LTX (GLOVE) ×2 IMPLANT
GOWN BRE IMP PREV XXLGXLNG (GOWN DISPOSABLE) ×2 IMPLANT
GOWN STRL NON-REIN LRG LVL3 (GOWN DISPOSABLE) ×10 IMPLANT
HEMOSTAT POWDER SURGIFOAM 1G (HEMOSTASIS) IMPLANT
HEMOSTAT SURGICEL 2X14 (HEMOSTASIS) IMPLANT
INSERT FOGARTY 61MM (MISCELLANEOUS) IMPLANT
KIT BASIN OR (CUSTOM PROCEDURE TRAY) ×2 IMPLANT
KIT ROOM TURNOVER OR (KITS) ×2 IMPLANT
LOOP VESSEL MINI RED (MISCELLANEOUS) ×2 IMPLANT
MANIFOLD NEPTUNE WASTE (CANNULA) ×2 IMPLANT
MARKER SKIN DUAL TIP RULER LAB (MISCELLANEOUS) ×2 IMPLANT
NS IRRIG 1000ML POUR BTL (IV SOLUTION) ×6 IMPLANT
PACK CHEST (CUSTOM PROCEDURE TRAY) ×2 IMPLANT
PACK UNIVERSAL I (CUSTOM PROCEDURE TRAY) ×2 IMPLANT
PAD ARMBOARD 7.5X6 YLW CONV (MISCELLANEOUS) ×4 IMPLANT
PAD SHARPS MAGNETIC DISPOSAL (MISCELLANEOUS) ×2 IMPLANT
PENCIL BUTTON HOLSTER BLD 10FT (ELECTRODE) IMPLANT
PIN SAFETY STERILE (MISCELLANEOUS) IMPLANT
PLUG CATH AND CAP STER (CATHETERS) ×2 IMPLANT
RELOAD EGIA 45 MED/THCK PURPLE (STAPLE) ×4 IMPLANT
RELOAD EGIA 60 MED/THCK PURPLE (STAPLE) ×4 IMPLANT
RELOAD EN GI ROT 45 2.0 (ENDOMECHANICALS) ×6 IMPLANT
SCALPEL HARMONIC ACE (MISCELLANEOUS) IMPLANT
SEALANT SURG COSEAL 8ML (VASCULAR PRODUCTS) IMPLANT
SPECIMEN JAR LARGE (MISCELLANEOUS) ×2 IMPLANT
SPONGE GAUZE 4X4 12PLY (GAUZE/BANDAGES/DRESSINGS) ×2 IMPLANT
SPONGE LAP 18X18 X RAY DECT (DISPOSABLE) ×6 IMPLANT
STAPLER CIRC 25MM 4.8MM THK (STAPLE) IMPLANT
STAPLER TRANS-ORAL 25MM EEA (STAPLE) IMPLANT
STAPLER VISISTAT 35W (STAPLE) ×4 IMPLANT
SUT CHROMIC 4 0 SH 27 (SUTURE) IMPLANT
SUT PDS AB 1 TP1 54 (SUTURE) ×4 IMPLANT
SUT PDS AB 4-0 SH 27 (SUTURE) ×2 IMPLANT
SUT PROLENE 0 CT 1 CR/8 (SUTURE) ×2 IMPLANT
SUT PROLENE 2 0 MH 48 (SUTURE) IMPLANT
SUT PROLENE 3 0 RB 1 (SUTURE) IMPLANT
SUT PROLENE 4 0 RB 1 (SUTURE) ×1
SUT PROLENE 4-0 RB1 .5 CRCL 36 (SUTURE) ×1 IMPLANT
SUT SILK 0 FSL (SUTURE) ×2 IMPLANT
SUT SILK 2 0 (SUTURE) ×2
SUT SILK 2 0 SH (SUTURE) ×2 IMPLANT
SUT SILK 2 0 SH CR/8 (SUTURE) ×4 IMPLANT
SUT SILK 2 0 TIES 10X30 (SUTURE) IMPLANT
SUT SILK 2 0SH CR/8 30 (SUTURE) ×6 IMPLANT
SUT SILK 2-0 18XBRD TIE 12 (SUTURE) ×2 IMPLANT
SUT SILK 3 0 (SUTURE) ×2
SUT SILK 3 0 SH CR/8 (SUTURE) ×4 IMPLANT
SUT SILK 3 0 TIES 10X30 (SUTURE) IMPLANT
SUT SILK 3 0SH CR/8 30 (SUTURE) ×4 IMPLANT
SUT SILK 3-0 18XBRD TIE 12 (SUTURE) ×1 IMPLANT
SUT VIC AB 1 CTX 18 (SUTURE) ×4 IMPLANT
SUT VIC AB 1 CTX 27 (SUTURE) ×2 IMPLANT
SUT VIC AB 2-0 CT1 18 (SUTURE) IMPLANT
SUT VIC AB 2-0 CTX 36 (SUTURE) ×2 IMPLANT
SUT VIC AB 3-0 MH 27 (SUTURE) ×2 IMPLANT
SUT VIC AB 3-0 SH 18 (SUTURE) ×4 IMPLANT
SUT VICRYL 2 TP 1 (SUTURE) IMPLANT
SYR 20CC LL (SYRINGE) ×2 IMPLANT
SYR 30ML LL (SYRINGE) IMPLANT
SYSTEM SAHARA CHEST DRAIN RE-I (WOUND CARE) IMPLANT
TAPE UMBILICAL COTTON 1/8X30 (MISCELLANEOUS) ×2 IMPLANT
TOWEL OR 17X24 6PK STRL BLUE (TOWEL DISPOSABLE) ×4 IMPLANT
TOWEL OR 17X26 10 PK STRL BLUE (TOWEL DISPOSABLE) ×4 IMPLANT
TRAY FOLEY CATH 14FRSI W/METER (CATHETERS) ×2 IMPLANT
TROCAR XCEL BLADELESS 5X75MML (TROCAR) IMPLANT
TUBE CONNECTING 12X1/4 (SUCTIONS) ×2 IMPLANT
WATER STERILE IRR 1000ML POUR (IV SOLUTION) ×2 IMPLANT
YANKAUER SUCT BULB TIP NO VENT (SUCTIONS) ×2 IMPLANT

## 2011-12-20 NOTE — Transfer of Care (Signed)
Immediate Anesthesia Transfer of Care Note  Patient: [name]  Procedure(s) Performed:  ESOPHAGECTOMY PARTIAL  Patient Location: SICU  Anesthesia Type: General  Level of Consciousness: Patient remains intubated per anesthesia plan  Airway & Oxygen Therapy: Patient remains intubated per anesthesia plan and Patient placed on Ventilator (see vital sign flow sheet for setting)  Post-op Assessment: Post -op Vital signs reviewed and stable  Post vital signs: Reviewed and stable Filed Vitals:   12/20/11 0447  BP: 116/72  Pulse: 69  Temp: 36.2 C  Resp: 19    Complications: No apparent anesthesia complications

## 2011-12-20 NOTE — Procedures (Signed)
12/20/11 Extubation Note: Pulled ETT at 1705 per the Rapid Wean Protocol. Time Out was performed. Patient meet all criteria for extubation. Placed patient on 4L nasal cannula per protocol. Will follow up with ABG in 1 hour.

## 2011-12-20 NOTE — H&P (View-Only) (Signed)
HPI patient returns for today. She was scheduled for an esophagogastrectomy Monday. Unfortunately she developed herpes zoster. She's been on ancyclovir. The area is in the right T11-12 dermatome. The lesions appeared to be healing with minimal drainage. She is having no pain. We plan to proceed with the esophagogastrectomy on January 14. She will be admitted January 13. Risk of the procedure were explained to the patient and her family. Current Outpatient Prescriptions  Medication Sig Dispense Refill  . atenolol (TENORMIN) 50 MG tablet Take 50 mg by mouth 2 (two) times daily.       . Calcium-Vitamin D-Vitamin K 500-100-40 MG-UNT-MCG CHEW Chew 1 tablet by mouth 2 (two) times daily.       . cyanocobalamin (,VITAMIN B-12,) 1000 MCG/ML injection Inject 1,000 mcg into the muscle every 30 (thirty) days.        . cycloSPORINE (RESTASIS) 0.05 % ophthalmic emulsion Place 1 drop into both eyes 2 (two) times daily.        . diphenhydrAMINE (BENADRYL) 25 MG tablet Take 25 mg by mouth 2 (two) times daily as needed. Pt takes with Norco.       . fluorometholone (FML) 0.1 % ophthalmic suspension Place 1 drop into both ears as needed. For ear ache.       Marland Kitchen HYDROcodone-acetaminophen (NORCO) 7.5-325 MG per tablet Take 1 tablet by mouth 2 (two) times daily.        . predniSONE (DELTASONE) 1 MG tablet Take 1 mg by mouth 2 (two) times daily.        . RABEprazole (ACIPHEX) 20 MG tablet Take 20 mg by mouth daily.        . raloxifene (EVISTA) 60 MG tablet Take 60 mg by mouth daily.        . St Johns Wort 300 MG CAPS Take 300 mg by mouth 2 (two) times daily.        Marland Kitchen tiZANidine (ZANAFLEX) 4 MG tablet Take 2 mg by mouth 2 (two) times daily.        . traMADol (ULTRAM) 50 MG tablet Take 100 mg by mouth 2 (two) times daily.       . valACYclovir (VALTREX) 1000 MG tablet Take 1,000 mg by mouth 2 (two) times daily.         Review of Systems: Recent herpes zoster right T11-12 dermatome   Physical Exam lungs are clear  attestation percussion resolving herpes zoster right abdomen   Diagnostic Tests: None   Impression: Stage IIIa esophageal cancer status post radiation and chemotherapy   Plan: The esophagogastrectomy January 14

## 2011-12-20 NOTE — Brief Op Note (Signed)
12/19/2011 - 12/20/2011  11:19 AM  PATIENT:  [name]  78 y.o. female  PRE-OPERATIVE DIAGNOSIS:  ESOPHAGEAL CANCER  POST-OPERATIVE DIAGNOSIS:  ESOPHAGEAL CANCER  PROCEDURE:  Procedure(s): Transhiatal esophagogastrectomy, pyloroplasty, jejunostomy  SURGEON:  Surgeon(s): D Karle Plumber, MD Delight Ovens, MD  PHYSICIAN ASSISTANT: Coral Ceo, PA-C  ANESTHESIA:   general  PATIENT CONDITION:  ICU - intubated and hemodynamically stable.

## 2011-12-20 NOTE — Interval H&P Note (Signed)
History and Physical Interval Note:  12/20/2011 7:14 AM  [name]  has presented today for surgery, with the diagnosis of ESOPHAGEAL CANCER  The various methods of treatment have been discussed with the patient and family. After consideration of risks, benefits and other options for treatment, the patient has consented to  Procedure(s): ESOPHAGECTOMY PARTIAL as a surgical intervention .  The patients' history has been reviewed, patient examined, no change in status, stable for surgery.  I have reviewed the patients' chart and labs.  Questions were answered to the patient's satisfaction.     Cameron Proud

## 2011-12-20 NOTE — Preoperative (Signed)
Beta Blockers   Reason not to administer Beta Blockers:Pt took Atenolol @2100  hr on 12/19/11

## 2011-12-20 NOTE — Anesthesia Preprocedure Evaluation (Addendum)
Anesthesia Evaluation  Patient identified by MRN, date of birth, ID band Patient awake    Airway Mallampati: II TM Distance: >3 FB     Dental  (+) Teeth Intact   Pulmonary shortness of breath and with exertion, COPDformer smoker (quit 15-20 years ago)         Cardiovascular hypertension, Pt. on medications and Pt. on home beta blockers Regular Normal    Neuro/Psych  Neuromuscular disease    GI/Hepatic hiatal hernia, PUD, GERD-  Medicated,  Endo/Other    Renal/GU      Musculoskeletal  (+) Fibromyalgia -, narcotic dependent  Abdominal   Peds  Hematology   Anesthesia Other Findings   Reproductive/Obstetrics                         Anesthesia Physical Anesthesia Plan  ASA: III  Anesthesia Plan: General   Post-op Pain Management:    Induction: Intravenous  Airway Management Planned: Oral ETT  Additional Equipment: Arterial line and CVP  Intra-op Plan:   Post-operative Plan: Extubation in OR and Possible Post-op intubation/ventilation  Informed Consent: I have reviewed the patients History and Physical, chart, labs and discussed the procedure including the risks, benefits and alternatives for the proposed anesthesia with the patient or authorized representative who has indicated his/her understanding and acceptance.   Dental advisory given, History available from chart only and Only emergency history available  Plan Discussed with: CRNA, Anesthesiologist and Surgeon  Anesthesia Plan Comments:         Anesthesia Quick Evaluation

## 2011-12-20 NOTE — Progress Notes (Signed)
UR Completed.  Aurelia Gras Jane [phone] 12/20/2011  

## 2011-12-20 NOTE — Anesthesia Procedure Notes (Signed)
Procedure Name: Intubation Date/Time: 12/20/2011 7:25 AM Performed by: Fuller Canada Pre-anesthesia Checklist: Patient identified, Timeout performed, Emergency Drugs available, Suction available and Patient being monitored Patient Re-evaluated:Patient Re-evaluated prior to inductionOxygen Delivery Method: Circle System Utilized Preoxygenation: Pre-oxygenation with 100% oxygen Intubation Type: IV induction Ventilation: Mask ventilation without difficulty Laryngoscope Size: Mac and 3 Grade View: Grade I Tube type: Oral Tube size: 7.5 mm Number of attempts: 1 Airway Equipment and Method: stylet Placement Confirmation: positive ETCO2,  ETT inserted through vocal cords under direct vision and breath sounds checked- equal and bilateral Secured at: 21 cm Tube secured with: Tape Dental Injury: Teeth and Oropharynx as per pre-operative assessment

## 2011-12-21 ENCOUNTER — Inpatient Hospital Stay (HOSPITAL_COMMUNITY): Payer: Medicare Other

## 2011-12-21 ENCOUNTER — Encounter (HOSPITAL_COMMUNITY): Payer: Self-pay | Admitting: Thoracic Surgery

## 2011-12-21 LAB — CBC
MCH: 29.4 pg (ref 26.0–34.0)
MCHC: 31.4 g/dL (ref 30.0–36.0)
Platelets: 197 10*3/uL (ref 150–400)

## 2011-12-21 LAB — BASIC METABOLIC PANEL
Calcium: 9.2 mg/dL (ref 8.4–10.5)
GFR calc Af Amer: >90 mL/min (ref >90)
GFR calc non Af Amer: 81 mL/min — ABNORMAL LOW (ref >90)
Sodium: 134 mEq/L — ABNORMAL LOW (ref 135–145)

## 2011-12-21 LAB — POCT I-STAT 3, ART BLOOD GAS (G3+)
O2 Saturation: 95 %
pCO2 arterial: 37.5 mmHg (ref 35.0–45.0)
pH, Arterial: 7.374 (ref 7.350–7.400)

## 2011-12-21 MED ORDER — METOPROLOL TARTRATE 1 MG/ML IV SOLN
2.5000 mg | Freq: Four times a day (QID) | INTRAVENOUS | Status: DC
  Administered 2011-12-21: 17:00:00 via INTRAVENOUS
  Administered 2011-12-22: 2.5 mg via INTRAVENOUS
  Filled 2011-12-21 (×5): qty 5

## 2011-12-21 MED ORDER — METOPROLOL TARTRATE 1 MG/ML IV SOLN
INTRAVENOUS | Status: AC
  Filled 2011-12-21: qty 5

## 2011-12-21 MED ORDER — FENTANYL CITRATE 0.05 MG/ML IJ SOLN
50.0000 ug | INTRAMUSCULAR | Status: DC | PRN
  Administered 2011-12-21 – 2011-12-22 (×10): 100 ug via INTRAVENOUS
  Administered 2011-12-22 – 2011-12-23 (×2): 50 ug via INTRAVENOUS
  Administered 2011-12-23: 100 ug via INTRAVENOUS
  Administered 2011-12-23 (×2): 50 ug via INTRAVENOUS
  Administered 2011-12-23 (×2): 100 ug via INTRAVENOUS
  Administered 2011-12-23 (×3): 50 ug via INTRAVENOUS
  Administered 2011-12-24 – 2011-12-25 (×10): 100 ug via INTRAVENOUS
  Administered 2011-12-25 – 2011-12-26 (×3): 50 ug via INTRAVENOUS
  Administered 2011-12-26 (×4): 100 ug via INTRAVENOUS
  Administered 2011-12-26: 50 ug via INTRAVENOUS
  Administered 2011-12-26 – 2011-12-28 (×9): 100 ug via INTRAVENOUS
  Administered 2011-12-29 – 2011-12-30 (×4): 50 ug via INTRAVENOUS
  Administered 2012-01-01 – 2012-01-03 (×7): 100 ug via INTRAVENOUS
  Filled 2011-12-21 (×58): qty 2

## 2011-12-21 MED ORDER — JEVITY 1.2 CAL PO LIQD
1000.0000 mL | ORAL | Status: DC
  Administered 2011-12-21: 15:00:00 via ORAL
  Administered 2011-12-22 – 2011-12-24 (×2): 1000 mL via ORAL
  Filled 2011-12-21 (×6): qty 1000

## 2011-12-21 NOTE — Op Note (Signed)
[name], [name]             ACCOUNT NO.:  [id number]  MEDICAL RECORD NO.:  [id number]  LOCATION:  2304                         FACILITY:  [facility]  PHYSICIAN:  Ines Bloomer, M.D. DATE OF BIRTH:  March 15, 1934  DATE OF PROCEDURE: DATE OF DISCHARGE:                              OPERATIVE REPORT   PREOPERATIVE DIAGNOSIS:  Stage IIIA adenocarcinoma of the esophagus status post radiation and chemotherapy.  POSTOPERATIVE DIAGNOSIS:  Stage IIIA adenocarcinoma of the esophagus status post radiation and chemotherapy.  OPERATION PERFORMED:  Transhiatal esophagogastrectomy, jejunostomy, and pyloroplasty.  SURGEON:  Ines Bloomer, M.D.  FIRST ASSISTANT:  Sheliah Plane, MD and Coral Ceo, PA-C.  ANESTHESIA:  General anesthesia.  After general anesthesia, the patient was prepped and draped in usual sterile manner and he was placed in supine position.  He was prepped from his chin down to his pubis.  The patient was put in reverse Trendelenburg and the patient incision was made from the xiphoid down to 3/4 the way down toward the umbilicus.  The fascia was divided with electrocautery.  The abdomen was entered.  No liver nodules were seen, so we decided to proceed with doing a Kocher maneuver and we slowly dissected up the duodenum and divided it with electrocautery as well as with the Harmonic scalpel and we took the Kocher maneuver over to the inferior vena cava.  After this had been done, we then dissected around the distal esophagus and looped that with a Penrose drain.  We then started in the lesser curve, dividing the omentum with clamps and 2-0 silk or using the Harmonic scalpel, freeing it up toward the short gastrics and then dividing the short gastrics with Harmonic scalpel and also using some Ligaclips.  The greater curvature of the stomach with the short gastrics was dissected off the spleen and then we dissected out the hiatus.  The patient had hilar hernias.  We  had divided lot of hernia sac and dissecting out the  distal esophagus and stomach.  After this had been done, we then dissected posteriorly, dissecting the stomach off the pancreas and identified the left gastric artery.  We then stapled and divided the left gastric artery and the coronary vein with a Covidien stapler.  This freed up the stomach.  We had preserved the gastroepiploic.  We then did a blunt dissection of the esophagus up to the tracheal bifurcation and made an incision in the neck along the left sternocleidomastoid and reflected the carotid and jugular vein laterally and identified omohyoid and divided it and dissected down over the thyroid medially and dissected down to the esophagus and the prevertebral fascia.  The recurrent laryngeal nerve was identified and preserved.  We then dissected around the esophagus and looped that with Penrose drain and then dissected down the esophagus and so we had the esophagus completely freed up from above and below and then we pulled the NG tube back which had been placed through the nares and stapled it and divided the esophagus approximately 8 cm from the cricopharyngeus. Esophagus was then pulled down into the abdomen.  We then divided the distal stomach, stitching the stomach.  We came across it with the stapler  with Covidien purple stapler with 3 applications.  This allowed [place] to make appropriate gastric tube.  We marked the area where the anastomosis would be, and then placed the stomach and then proceeded to do a pyloroplasty where we cut through duodenum and across the pylorus longitudinally and closed it transversely with 2 layers of interrupted 2- 0 silk and 3-0 silk using some omentum to place over the pyloroplasty. We then pulled the stomach up to the chest using the Foley catheter and a camera bag pulling the stomach up and then grasping the stomach with a Babcock.  The esophagus staple line was removed and the esophagus  was stapled with 2-0 silks to the stomach and then the stomach was opened longitudinally and at that time 35 stapler was fired doing the tertiary portion of the anastomosis was 1 in the stomach and 1 in the esophagus. We then in the anterior portion of the esophagus with interrupted 3-0 Vicryl and interrupted 3-0 silk, 2-layer closure and a seal was applied to the staple line.  A Jackson-Pratt drain was used to drain the area and then the wound was closed with interrupted 2-0 Vicryls in the muscle layer, running 2-0 Vicryl, and Ethicon skin clips.  The patient was then turned to the abdomen where the hiatus was sutured to the stomach to prevent herniation.  We then did a jejunostomy approximately 30-35 cm from the ligament of Treitz using a horizontal mattress suture and a mesenteric artery and opened the jejunum and placement a #16 catheter with extra holes through the jejunum and then sutured in place with 2-0 silk using a Wetzel modification.  The jejunostomy was brought out through separate stab wound on the chest wall and sutured in place with 0 silk.  It flushed easily.  The chest was irrigated copiously.  The jejunostomy was sutured to the abdominal wall with 2-0 silk.  The abdomen was closed with #1 Vicryl and running #1 PDS and Ethicon skin clips.  The patient returned to recovery room in stable condition.     Ines Bloomer, M.D.     DPB/MEDQ  D:  12/20/2011  T:  12/21/2011  Job:  [id number]

## 2011-12-21 NOTE — Anesthesia Postprocedure Evaluation (Signed)
  Anesthesia Post-op Note  Patient: [name]  Procedure(s) Performed:  ESOPHAGECTOMY PARTIAL  Patient Location: ICU  Anesthesia Type: General  Level of Consciousness: awake, alert  and patient cooperative  Airway and Oxygen Therapy: Patient Spontanous Breathing and Patient connected to nasal cannula oxygen  Post-op Pain: none  Post-op Assessment: Post-op Vital signs reviewed, Patient's Cardiovascular Status Stable and Respiratory Function Stable  Post-op Vital Signs: Reviewed and stable  Complications: No apparent anesthesia complications

## 2011-12-21 NOTE — Progress Notes (Signed)
MD paged concerning pt tachycardia.  Continue to observe as advised by MD.

## 2011-12-21 NOTE — Anesthesia Postprocedure Evaluation (Signed)
  Anesthesia Post-op Note  Patient: [name]  Procedure(s) Performed:  ESOPHAGECTOMY PARTIAL  Patient Location: ICU  Anesthesia Type: General  Level of Consciousness: sedated  Airway and Oxygen Therapy: Patient remains intubated per anesthesia plan  Post-op Pain: none  Post-op Assessment: Post-op Vital signs reviewed, Patient's Cardiovascular Status Stable and Respiratory Function Stable  Post-op Vital Signs: Reviewed and stable  Complications: No apparent anesthesia complications

## 2011-12-21 NOTE — Progress Notes (Signed)
                                              1 Day Post-Op Procedure(s) (LRB): ESOPHAGECTOMY PARTIAL (N/A) Subjective: Patient is stable. Chest x-ray showed bilateral atelectasis. Laboratory hematocrit 31 white count 11,000. Patient has some mild hoarseness. Complains of pain. Wounds are are satisfactory. Will discontinue art line the  Objective: Vital signs in last 24 hours: Temp:  [97.4 F (36.3 C)-98.9 F (37.2 C)] 98.2 F (36.8 C) (01/15 0343) Pulse Rate:  [70-114] 103  (01/15 0700) Cardiac Rhythm:  [-] Normal sinus rhythm;Sinus tachycardia (01/15 0000) Resp:  [12-32] 23  (01/15 0700) BP: (106-179)/(44-84) 129/58 mmHg (01/15 0700) SpO2:  [97 %-100 %] 97 % (01/15 0700) Arterial Line BP: (99-194)/(45-77) 166/56 mmHg (01/15 0700) FiO2 (%):  [30 %-50.1 %] 30 % (01/14 1700) Weight:  [72.4 kg (159 lb 9.8 oz)] 72.4 kg (159 lb 9.8 oz) (01/15 0600)  Hemodynamic parameters for last 24 hours:    Intake/Output from previous day: 01/14 0701 - 01/15 0700 In: 6512.8 [I.V.:5162.8; NG/GT:90; IV Piggyback:785] Out: 2230 [Urine:1670; Emesis/NG output:250; Drains:70; Blood:240] Intake/Output this shift:    General appearance: alert Heart: regular rate and rhythm, S1, S2 normal, no murmur, click, rub or gallop Lungs: diminished breath sounds bilaterally  Lab Results:  Basename 12/21/11 0352 12/20/11 1155  WBC 11.0* 9.1  HGB 10.0* 9.3*  HCT 31.8* 29.2*  PLT 197 167   BMET:  Basename 12/21/11 0352 12/20/11 1155  NA 134* 143  K 4.0 3.9  CL 104 110  CO2 21 24  GLUCOSE 152* 128*  BUN 9 14  CREATININE 0.74 0.80  CALCIUM 9.2 9.2    PT/INR:  Basename 12/19/11 1600  LABPROT 13.7  INR 1.03   ABG    Component Value Date/Time   PHART 7.374 12/21/2011 0400   HCO3 21.9 12/21/2011 0400   TCO2 23 12/21/2011 0400   ACIDBASEDEF 3.0* 12/21/2011 0400   O2SAT 95.0 12/21/2011 0400   CBG (last 3)  No results found for this basename: GLUCAP:3 in the last 72 hours  Assessment/Plan: S/P  Procedure(s) (LRB): ESOPHAGECTOMY PARTIAL (N/A) Discontinue art line start tube feedings   LOS: 2 days    [name] 12/21/2011

## 2011-12-21 NOTE — Addendum Note (Signed)
Addendum  created 12/21/11 1439 by Delbert Harness   Modules edited:Notes Section

## 2011-12-22 ENCOUNTER — Inpatient Hospital Stay (HOSPITAL_COMMUNITY): Payer: Medicare Other

## 2011-12-22 LAB — CBC
Hemoglobin: 8.9 g/dL — ABNORMAL LOW (ref 12.0–15.0)
MCHC: 31.2 g/dL (ref 30.0–36.0)

## 2011-12-22 LAB — COMPREHENSIVE METABOLIC PANEL
ALT: 9 U/L (ref 0–35)
AST: 26 U/L (ref 0–37)
Albumin: 2.4 g/dL — ABNORMAL LOW (ref 3.5–5.2)
Alkaline Phosphatase: 79 U/L (ref 39–117)
GFR calc Af Amer: >90 mL/min (ref >90)
Glucose, Bld: 146 mg/dL — ABNORMAL HIGH (ref 70–99)
Potassium: 4.4 mEq/L (ref 3.5–5.1)
Sodium: 131 mEq/L — ABNORMAL LOW (ref 135–145)
Total Protein: 5.5 g/dL — ABNORMAL LOW (ref 6.0–8.3)

## 2011-12-22 MED ORDER — HYDROCODONE-ACETAMINOPHEN 7.5-500 MG/15ML PO SOLN
10.0000 mL | Freq: Four times a day (QID) | ORAL | Status: DC | PRN
  Administered 2011-12-22 – 2011-12-25 (×5): 10 mL
  Administered 2011-12-25: 15:00:00
  Administered 2011-12-26 – 2012-01-03 (×22): 10 mL
  Filled 2011-12-22 (×21): qty 15
  Filled 2011-12-22: qty 30
  Filled 2011-12-22 (×8): qty 15

## 2011-12-22 MED ORDER — METOPROLOL TARTRATE 25 MG/10 ML ORAL SUSPENSION
12.5000 mg | Freq: Two times a day (BID) | ORAL | Status: DC
  Administered 2011-12-22 (×2): 12.5 mg via ORAL
  Filled 2011-12-22 (×4): qty 5

## 2011-12-22 MED ORDER — POTASSIUM CHLORIDE IN NACL 20-0.9 MEQ/L-% IV SOLN
INTRAVENOUS | Status: DC
  Administered 2011-12-22 – 2011-12-25 (×2): via INTRAVENOUS
  Filled 2011-12-22 (×8): qty 1000

## 2011-12-22 MED ORDER — PIPERACILLIN-TAZOBACTAM 3.375 G IVPB
3.3750 g | Freq: Three times a day (TID) | INTRAVENOUS | Status: AC
  Administered 2011-12-22 – 2011-12-24 (×6): 3.375 g via INTRAVENOUS
  Filled 2011-12-22 (×6): qty 50

## 2011-12-22 MED ORDER — METOCLOPRAMIDE HCL 5 MG/ML IJ SOLN
10.0000 mg | Freq: Four times a day (QID) | INTRAMUSCULAR | Status: DC
  Administered 2011-12-22 – 2011-12-25 (×10): 10 mg via INTRAVENOUS
  Filled 2011-12-22 (×19): qty 2

## 2011-12-22 MED ORDER — FUROSEMIDE 10 MG/ML IJ SOLN
20.0000 mg | Freq: Two times a day (BID) | INTRAMUSCULAR | Status: AC
  Administered 2011-12-22 (×2): 20 mg via INTRAVENOUS
  Filled 2011-12-22 (×3): qty 2

## 2011-12-22 MED ORDER — SODIUM CHLORIDE 0.9 % IJ SOLN
3.0000 mL | Freq: Two times a day (BID) | INTRAMUSCULAR | Status: DC
  Administered 2011-12-22 – 2012-01-03 (×19): 3 mL via INTRAVENOUS

## 2011-12-22 NOTE — Progress Notes (Signed)
UR Completed.  [name] [phone] 10/11/2012  

## 2011-12-22 NOTE — Progress Notes (Addendum)
                                              2 Days Post-Op Procedure(s) (LRB): ESOPHAGECTOMY PARTIAL (N/A) Subjective: Stable mild tachycardia. Chest x-ray improved. His white count at 13,000. Wounds are okay minimal drainage. Will leave in ICU one more day.  Objective: Vital signs in last 24 hours: Temp:  [97.7 F (36.5 C)-99.2 F (37.3 C)] 97.7 F (36.5 C) (01/16 0805) Pulse Rate:  [100-128] 116  (01/16 0600) Cardiac Rhythm:  [-] Sinus tachycardia (01/16 0600) Resp:  [12-17] 16  (01/16 0700) BP: (96-144)/(43-102) 125/57 mmHg (01/16 0700) SpO2:  [91 %-100 %] 100 % (01/16 0600) Weight:  [70.4 kg (155 lb 3.3 oz)] 70.4 kg (155 lb 3.3 oz) (01/16 0600)  Hemodynamic parameters for last 24 hours:    Intake/Output from previous day: 01/15 0701 - 01/16 0700 In: 2580 [I.V.:1500; NG/GT:90; IV Piggyback:185] Out: 1620 [Urine:1270; Emesis/NG output:350] Intake/Output this shift:    General appearance: alert Heart: regular rate and rhythm, S1, S2 normal, no murmur, click, rub or gallop Lungs: clear to auscultation bilaterally  Lab Results:  Basename 12/22/11 0400 12/21/11 0352  WBC 13.1* 11.0*  HGB 8.9* 10.0*  HCT 28.5* 31.8*  PLT 198 197   BMET:  Basename 12/22/11 0400 12/21/11 0352  NA 131* 134*  K 4.4 4.0  CL 102 104  CO2 24 21  GLUCOSE 146* 152*  BUN 9 9  CREATININE 0.78 0.74  CALCIUM 9.9 9.2    PT/INR:  Basename 12/19/11 1600  LABPROT 13.7  INR 1.03   ABG    Component Value Date/Time   PHART 7.374 12/21/2011 0400   HCO3 21.9 12/21/2011 0400   TCO2 23 12/21/2011 0400   ACIDBASEDEF 3.0* 12/21/2011 0400   O2SAT 95.0 12/21/2011 0400   CBG (last 3)  No results found for this basename: GLUCAP:3 in the last 72 hours  Assessment/Plan: S/P Procedure(s) (LRB): ESOPHAGECTOMY PARTIAL (N/A) Increase tube feedings. Decrease IVs. Start Lopressor. Leave in ICU.   LOS: 3 days    [name] [name] 12/22/2011   Leave Foley in one to 2 more days for diuresis.

## 2011-12-22 NOTE — Progress Notes (Signed)
INITIAL ADULT NUTRITION ASSESSMENT Date: 12/22/2011   Time: 10:55 AM  Reason for Assessment: new TF  ASSESSMENT: Female 78 y.o.  Dx: adenocarcinoma of the esophagus s/p radiation and chemotherapy  Hx:  Past Medical History  Diagnosis Date  . Pernicious anemia   . Osteoarthritis   . Fibromyalgia   . Skin lesion     chronic calcific cutaneous lesions  . HX of multiple bleeding ulcers 09/01/2011  . Hypertension     takes Atenolol daily  . Emphysema   . Shortness of breath     with exertion  . Bronchitis     hx  . Adenocarcinoma of gastroesophageal junction   . Dizziness     d/t crystals in ears that "roll around"and can't get them out  . Back pain   . Osteoporosis   . Scoliosis   . Psoriasis   . H/O hiatal hernia   . GERD (gastroesophageal reflux disease)     takes Aciphex daily  . Gastric ulcer   . Constipation     since Chemo and Radiation;finished up in Nov 2012  . Diverticulosis   . History of colonic polyps   . Shingles     broke out 2wks ago;has been on meds and to finish up tomorrow  . Kidney stone 25+yrs ago  . Pernicious anemia   . Vitamin B deficiency     Related Meds:     . albuterol  2.5 mg Nebulization Q6H  . cycloSPORINE  1 drop Both Eyes BID  . furosemide  20 mg Intravenous BID  . metoprolol  2.5 mg Intravenous Q6H  . metoprolol tartrate  12.5 mg Oral BID  . pantoprazole (PROTONIX) IV  40 mg Intravenous Q12H  . piperacillin-tazobactam (ZOSYN)  IV  3.375 g Intravenous Q8H  . sodium chloride  10 mL Intravenous Q12H  . DISCONTD: piperacillin-tazobactam (ZOSYN)  IV  3.375 g Intravenous Q8H    Ht: 5\' 4"  (162.6 cm)  Wt: 155 lb 3.3 oz (70.4 kg)  Ideal Wt: 54.5 kg % Ideal Wt: 129%  Usual Wt: 152 lb -- per office visit January 2013 % Usual Wt: 102%  Body mass index is 26.64 kg/(m^2).  Food/Nutrition Related Hx: nausea, poor intake, dysphagia per office visit notes (November -- December 2012)  Labs:  [facility]     Component Value Date/Time   NA 131* 12/22/2011 0400   K 4.4 12/22/2011 0400   CL 102 12/22/2011 0400   CO2 24 12/22/2011 0400   GLUCOSE 146* 12/22/2011 0400   BUN 9 12/22/2011 0400   CREATININE 0.78 12/22/2011 0400   CALCIUM 9.9 12/22/2011 0400   PROT 5.5* 12/22/2011 0400   ALBUMIN 2.4* 12/22/2011 0400   AST 26 12/22/2011 0400   ALT 9 12/22/2011 0400   ALKPHOS 79 12/22/2011 0400   BILITOT 0.5 12/22/2011 0400   GFRNONAA 80* 12/22/2011 0400   GFRAA >90 12/22/2011 0400    I/O last 3 completed shifts: In: 4840 [I.V.:3100; Other:1100; NG/GT:180; IV Piggyback:460] Out: 2365 [Urine:1830; Emesis/NG output:500; Drains:35] Total I/O In: 235 [I.V.:100; Other:135] Out: 100 [Urine:100]  Diet Order: NPO  Supplements/Tube Feeding: Jevity 1.2 formula at 45 ml/hr via J-tube -- increasing to goal rate of 60 ml/hr  IVF:    0.9 % NaCl with KCl 20 mEq / L Last Rate: 20 mL/hr at 12/22/11 1000  feeding supplement (JEVITY 1.2) Last Rate: 30 mL/hr at 12/21/11 1459  DISCONTD: dextrose 5 % and 0.45 % NaCl with KCl 20 mEq/L Last Rate: 100 mL/hr  at 12/22/11 0318    Estimated Nutritional Needs:   Kcal: 1800-2000 Protein: 105-115 gm Fluid: 1.8-2.0 L  RD unable to obtain much hx from pt at this time; s/p transhiatal esophagogastrectomy, jejunostomy, and pyloroplasty; extubated post-op 1/14; NGT to LIS; MD order of Jevity 1.2 at goal rate of 60 ml/hr to provide 1728 kcals, 80 gm protein, 1162 ml of free water; suspect some level of malnutrition however unable to dx at this time  NUTRTION DIAGNOSIS: -Inadequate oral intake (NI-2.1).  Status: Ongoing  RELATED TO: inability to eat, s/p esophagogastrectomy   AS EVIDENCE BY: NPO status  MONITORING/EVALUATION(Goals): Goal: EN to meet >90% of estimated nutrition needs to promote energy & protein repletion Monitor: EN tolerance, ability to take PO's, weight, labs, I/O's  EDUCATION NEEDS: -Education not appropriate at this time  INTERVENTION:  Recommend EN regimen change to Pivot 1.5 (immune  modulating formula) at goal rate of 50 ml/hr to provide 1800 kcals, 113 gm protein, 911 ml of free water  RD to follow for nutrition care plan  Dietitian #: [phone]  DOCUMENTATION CODES Per approved criteria  -Not Applicable    Alger Memos 12/22/2011, 10:55 AM

## 2011-12-23 ENCOUNTER — Ambulatory Visit: Payer: Medicare Other | Admitting: Nurse Practitioner

## 2011-12-23 ENCOUNTER — Inpatient Hospital Stay (HOSPITAL_COMMUNITY): Payer: Medicare Other

## 2011-12-23 LAB — CBC
MCHC: 31.3 g/dL (ref 30.0–36.0)
RDW: 16.1 % — ABNORMAL HIGH (ref 11.5–15.5)

## 2011-12-23 LAB — BASIC METABOLIC PANEL
BUN: 16 mg/dL (ref 6–23)
Creatinine, Ser: 0.86 mg/dL (ref 0.50–1.10)
GFR calc Af Amer: 75 mL/min — ABNORMAL LOW (ref >90)
GFR calc non Af Amer: 64 mL/min — ABNORMAL LOW (ref >90)
Potassium: 3.6 mEq/L (ref 3.5–5.1)

## 2011-12-23 MED ORDER — METOPROLOL TARTRATE 25 MG/10 ML ORAL SUSPENSION
25.0000 mg | Freq: Two times a day (BID) | ORAL | Status: DC
  Administered 2011-12-23 – 2011-12-28 (×12): 25 mg via ORAL
  Filled 2011-12-23 (×15): qty 10

## 2011-12-23 MED ORDER — METOPROLOL TARTRATE 1 MG/ML IV SOLN
2.5000 mg | Freq: Four times a day (QID) | INTRAVENOUS | Status: DC | PRN
  Administered 2011-12-24 (×2): 5 mg via INTRAVENOUS
  Filled 2011-12-23: qty 5

## 2011-12-23 MED ORDER — METOPROLOL TARTRATE 1 MG/ML IV SOLN: 2.5000 mg | Freq: Four times a day (QID) | INTRAVENOUS | Status: DC

## 2011-12-23 MED ORDER — ACETYLCYSTEINE 20 % IN SOLN
4.0000 mL | Freq: Four times a day (QID) | RESPIRATORY_TRACT | Status: DC
  Administered 2011-12-24 (×2): 4 mL via RESPIRATORY_TRACT
  Filled 2011-12-23 (×6): qty 4

## 2011-12-23 MED ORDER — METOPROLOL TARTRATE 1 MG/ML IV SOLN
2.5000 mg | Freq: Four times a day (QID) | INTRAVENOUS | Status: DC
  Administered 2011-12-23: 2.5 mg via INTRAVENOUS

## 2011-12-23 MED ORDER — ACETYLCYSTEINE 10% NICU INHALATION SOLUTION: 2.0000 mL | Freq: Four times a day (QID) | RESPIRATORY_TRACT | Status: DC

## 2011-12-23 NOTE — Progress Notes (Signed)
Patient ID: [name], female   DOB: July 03, 1934, 78 y.o.   MRN: [id number] BP 117/75  Pulse 128  Temp(Src) 99 F (37.2 C) (Oral)  Resp 14  Ht 5\' 4"  (1.626 m)  Wt 151 lb 0.2 oz (68.5 kg)  BMI 25.92 kg/m2  SpO2 97% Poor cough Course rhonchi   Lab Results  Component Value Date   WBC 10.1 12/23/2011   HGB 8.7* 12/23/2011   HCT 27.8* 12/23/2011   PLT 206 12/23/2011   GLUCOSE 143* 12/23/2011   ALT 9 12/22/2011   AST 26 12/22/2011   NA 136 12/23/2011   K 3.6 12/23/2011   CL 102 12/23/2011   CREATININE 0.86 12/23/2011   BUN 16 12/23/2011   CO2 26 12/23/2011   INR 1.03 12/19/2011    Increase respiratory rx .

## 2011-12-23 NOTE — Progress Notes (Signed)
Nursing Daily Progress:  Throughout the day today pt had pain rated 10 of 10 despite pain medication, yet she was unable to pinpoint the location of her pain, stated "I just hurt all over".  Pt was able to ambulate total of 235ft today with minimal assist.  Her cough is weak and she clearly has externally audible rhonchi but is unable to clear her secretions despite thorough efforts and encouragement from nursing and respiratory therapy.  Will pass this information to MD and nursing during report.

## 2011-12-23 NOTE — Progress Notes (Signed)
Physical Therapy Evaluation Patient Details Name: [name] MRN: [id number] DOB: 1933/12/28 Today's Date: 12/23/2011  Problem List:  Patient Active Problem List  Diagnoses  . Pernicious anemia  . Fibromyalgia  . Arthritis  . Skin lesion  . Transfusion history  . Adenocarcinoma of gastroesophageal junction  . HX of multiple bleeding ulcers  . Preop cardiovascular exam  . Hypertension    Past Medical History:  Past Medical History  Diagnosis Date  . Pernicious anemia   . Osteoarthritis   . Fibromyalgia   . Skin lesion     chronic calcific cutaneous lesions  . HX of multiple bleeding ulcers 09/01/2011  . Hypertension     takes Atenolol daily  . Emphysema   . Shortness of breath     with exertion  . Bronchitis     hx  . Adenocarcinoma of gastroesophageal junction   . Dizziness     d/t crystals in ears that "roll around"and can't get them out  . Back pain   . Osteoporosis   . Scoliosis   . Psoriasis   . H/O hiatal hernia   . GERD (gastroesophageal reflux disease)     takes Aciphex daily  . Gastric ulcer   . Constipation     since Chemo and Radiation;finished up in Nov 2012  . Diverticulosis   . History of colonic polyps   . Shingles     broke out 2wks ago;has been on meds and to finish up tomorrow  . Kidney stone 25+yrs ago  . Pernicious anemia   . Vitamin B deficiency    Past Surgical History:  Past Surgical History  Procedure Date  . Right hip replacement 2001  . Tonsillectomy     as a child  . Tubal ligation 1975  . Appendectomy 1975  . Cataract surgery 10+yrs ago  . Esophagogastroduodenoscopy   . Colonoscopy   . Extubation 12/20/2011       . Partial esophagectomy 12/20/2011    Procedure: ESOPHAGECTOMY PARTIAL;  Surgeon: Norton Blizzard, MD;  Location: [facility] OR;  Service: Thoracic;  Laterality: N/A;    PT Assessment/Plan/Recommendation PT Assessment Clinical Impression Statement: Pt admitted for partial esophagectomy. Activity limited by HR  pt on beta blocker at home but not currently and extreme tachy only with activity but unsustained. HR at rest 120-130 and up to 150 with activity. RN aware and stated same as earlier today for ambulation with him and only has meds for sustained extreme tachycardia. Pt limited mobiliy impairs abiliy to care for sefl and will benefit from therapy to maximize independence and decrease burden of care before transfer to next venue of care. PT Recommendation/Assessment: Patient will need skilled PT in the acute care venue PT Problem List: Decreased cognition;Decreased knowledge of use of DME;Decreased activity tolerance;Decreased mobility;Pain;Cardiopulmonary status limiting activity PT Therapy Diagnosis : Abnormality of gait;Difficulty walking;Acute pain PT Plan PT Frequency: Min 3X/week PT Treatment/Interventions: DME instruction;Gait training;Functional mobility training;Therapeutic activities;Therapeutic exercise;Patient/family education PT Recommendation Recommendations for Other Services: OT consult Follow Up Recommendations: Supervision for mobility/OOB;Home health PT (if pt progresses well, otherwise SNF may need to be considered) Equipment Recommended: Defer to next venue PT Goals  Acute Rehab PT Goals PT Goal Formulation: With patient/family Time For Goal Achievement: 2 weeks Pt will Roll Supine to Left Side: with supervision PT Goal: Rolling Supine to Left Side - Progress: Goal set today Pt will go Supine/Side to Sit: with supervision PT Goal: Supine/Side to Sit - Progress: Goal set today Pt will  go Sit to Supine/Side: with min assist PT Goal: Sit to Supine/Side - Progress: Goal set today Pt will go Sit to Stand: with supervision PT Goal: Sit to Stand - Progress: Goal set today Pt will go Stand to Sit: with supervision PT Goal: Stand to Sit - Progress: Goal set today Pt will Ambulate: >150 feet;with supervision;with least restrictive assistive device PT Goal: Ambulate - Progress: Goal  set today  PT Evaluation Precautions/Restrictions  Precautions Precautions: Fall Prior Functioning  Home Living Lives With: Sheran Spine Help From: Family Type of Home: House Home Layout: Two level;Able to live on main level with bedroom/bathroom Home Access: Level entry Bathroom Shower/Tub: Health visitor: Standard Home Adaptive Equipment: Crutches;Wheelchair - manual;Walker - rolling Prior Function Level of Independence: Independent with basic ADLs;Independent with gait;Independent with transfers;Needs assistance with homemaking Driving: Yes Vocation: Retired Comments: 24 hour assist available per son Cognition Cognition Arousal/Alertness: Awake/alert Overall Cognitive Status: Impaired Orientation Level: Oriented X4 Following Commands: Follows one step commands with increased time Safety/Judgement: Decreased safety judgement for tasks assessed Decreased Safety/Judgement: Decreased awareness of need for assistance Cognition - Other Comments: Pt with flat affect and minimal verbalizations although repeatedly asked questions very selective in choosing to answer Sensation/Coordination Sensation Light Touch: Appears Intact Extremity Assessment RLE Assessment RLE Assessment: Within Functional Limits LLE Assessment LLE Assessment: Within Functional Limits Mobility (including Balance) Bed Mobility Bed Mobility: Yes Rolling Left: 4: Min assist Rolling Left Details (indicate cue type and reason): sequential cueing Left Sidelying to Sit: 4: Min assist;HOB flat Sitting - Scoot to [place] of Bed: 4: Min assist Sitting - Scoot to [place] of Bed Details (indicate cue type and reason): cues for reciprocal scooting with use of pad Transfers Sit to Stand: 4: Min assist;From bed Sit to Stand Details (indicate cue type and reason): cues for sequence and assist for anterior translation Stand to Sit: 4: Min assist;To chair/3-in-1 Stand to Sit Details: minguard with cues to  place hand on chair and back fully to surface Ambulation/Gait Ambulation/Gait: Yes Ambulation/Gait Assistance: 4: Min assist Ambulation/Gait Assistance Details (indicate cue type and reason): assist to advance and steer RW, very short shuffling steps Ambulation Distance (Feet): 110 Feet Assistive device: Rolling walker Gait Pattern: Decreased stride length;Shuffle Stairs: No  Posture/Postural Control Posture/Postural Control: No significant limitations Balance Balance Assessed: No Exercise    End of Session PT - End of Session Equipment Utilized During Treatment: Gait belt Activity Tolerance: Treatment limited secondary to medical complications (Comment) Patient left: in chair;with call bell in reach;with family/visitor present Nurse Communication: Mobility status for transfers;Mobility status for ambulation General Behavior During Session: Flat affect Cognition: Impaired  Delorse Lek 12/23/2011, 1:44 PM Toney Sang, PT [phone]

## 2011-12-24 ENCOUNTER — Inpatient Hospital Stay (HOSPITAL_COMMUNITY): Payer: Medicare Other

## 2011-12-24 LAB — CBC
Platelets: 239 10*3/uL (ref 150–400)
RBC: 3.09 MIL/uL — ABNORMAL LOW (ref 3.87–5.11)
WBC: 12.2 10*3/uL — ABNORMAL HIGH (ref 4.0–10.5)

## 2011-12-24 LAB — BASIC METABOLIC PANEL
CO2: 28 mEq/L (ref 19–32)
Calcium: 10.7 mg/dL — ABNORMAL HIGH (ref 8.4–10.5)
Chloride: 105 mEq/L (ref 96–112)
Potassium: 4.2 mEq/L (ref 3.5–5.1)
Sodium: 141 mEq/L (ref 135–145)

## 2011-12-24 LAB — CULTURE, RESPIRATORY W GRAM STAIN

## 2011-12-24 LAB — URINE CULTURE
Colony Count: NO GROWTH
Culture  Setup Time: 201301182128
Culture: NO GROWTH

## 2011-12-24 LAB — EXPECTORATED SPUTUM ASSESSMENT W GRAM STAIN, RFLX TO RESP C

## 2011-12-24 MED ORDER — LEVALBUTEROL HCL 1.25 MG/0.5ML IN NEBU
1.2500 mg | INHALATION_SOLUTION | Freq: Four times a day (QID) | RESPIRATORY_TRACT | Status: DC
  Administered 2011-12-24: 1.25 mg via RESPIRATORY_TRACT
  Filled 2011-12-24 (×4): qty 0.5

## 2011-12-24 MED ORDER — ACETYLCYSTEINE 20 % IN SOLN
4.0000 mL | Freq: Four times a day (QID) | RESPIRATORY_TRACT | Status: DC
  Administered 2011-12-24 – 2011-12-26 (×7): 4 mL via RESPIRATORY_TRACT
  Filled 2011-12-24 (×11): qty 4

## 2011-12-24 MED ORDER — BISACODYL 10 MG RE SUPP
10.0000 mg | Freq: Once | RECTAL | Status: AC
  Administered 2011-12-24: 10 mg via RECTAL
  Filled 2011-12-24: qty 1

## 2011-12-24 MED ORDER — JEVITY 1.2 CAL PO LIQD
1000.0000 mL | ORAL | Status: DC
  Administered 2011-12-25 – 2011-12-26 (×2): 1000 mL via ORAL
  Administered 2011-12-26: 05:00:00 via ORAL
  Administered 2011-12-27: 1000 mL via ORAL
  Filled 2011-12-24 (×8): qty 1000

## 2011-12-24 MED ORDER — LEVALBUTEROL HCL 1.25 MG/0.5ML IN NEBU
1.2500 mg | INHALATION_SOLUTION | Freq: Four times a day (QID) | RESPIRATORY_TRACT | Status: AC
  Administered 2011-12-24 – 2011-12-26 (×8): 1.25 mg via RESPIRATORY_TRACT
  Filled 2011-12-24 (×8): qty 0.5

## 2011-12-24 NOTE — Progress Notes (Signed)
                                              4 Days Post-Op Procedure(s) (LRB): ESOPHAGECTOMY PARTIAL (N/A) Subjective: Chest x-ray is improved. Having congestion. There is distention on chest x-ray of stomach. Mild hoarseness. Wounds are okay. No drainage. Will discontinue NG tube. Continue to ambulate. Leave in ICU one more day. Still having tachycardia. Hematocrit 29. Objective: Vital signs in last 24 hours: Temp:  [97.6 F (36.4 C)-99.1 F (37.3 C)] 98.2 F (36.8 C) (01/18 0741) Pulse Rate:  [102-138] 134  (01/18 0900) Cardiac Rhythm:  [-] Sinus tachycardia (01/18 0900) Resp:  [11-19] 15  (01/18 0900) BP: (96-154)/(44-88) 153/72 mmHg (01/18 0900) SpO2:  [96 %-100 %] 99 % (01/18 0900) Weight:  [68.9 kg (151 lb 14.4 oz)] 68.9 kg (151 lb 14.4 oz) (01/18 0400)  Hemodynamic parameters for last 24 hours:    Intake/Output from previous day: 01/17 0701 - 01/18 0700 In: 2715.5 [I.V.:120; NG/GT:20; IV Piggyback:265.5] Out: 895 [Urine:690; Emesis/NG output:200; Drains:5] Intake/Output this shift: Total I/O In: 160 [I.V.:40; Other:120] Out: 125 [Urine:125]  General appearance: alert Heart: regular rate and rhythm, S1, S2 normal, no murmur, click, rub or gallop Lungs: diminished breath sounds bilaterally  Lab Results:  Basename 12/24/11 0400 12/23/11 0400  WBC 12.2* 10.1  HGB 9.1* 8.7*  HCT 29.4* 27.8*  PLT 239 206   BMET:  Basename 12/24/11 0400 12/23/11 0400  NA 141 136  K 4.2 3.6  CL 105 102  CO2 28 26  GLUCOSE 137* 143*  BUN 19 16  CREATININE 0.81 0.86  CALCIUM 10.7* 10.4    PT/INR: No results found for this basename: LABPROT,INR in the last 72 hours ABG    Component Value Date/Time   PHART 7.374 12/21/2011 0400   HCO3 21.9 12/21/2011 0400   TCO2 23 12/21/2011 0400   ACIDBASEDEF 3.0* 12/21/2011 0400   O2SAT 95.0 12/21/2011 0400   CBG (last 3)  No results found for this basename: GLUCAP:3 in the last 72 hours  Assessment/Plan: S/P Procedure(s)  (LRB): ESOPHAGECTOMY PARTIAL (N/A) Continue the NG tube. Start ice chips. Switch tube will Xopenex.   LOS: 5 days    Ronelle Smallman [facility] 12/24/2011

## 2011-12-24 NOTE — Progress Notes (Signed)
Sputum coughed up and collected and sent to lab.

## 2011-12-25 ENCOUNTER — Inpatient Hospital Stay (HOSPITAL_COMMUNITY): Payer: Medicare Other

## 2011-12-25 LAB — CBC
MCV: 96.2 fL (ref 78.0–100.0)
Platelets: 212 10*3/uL (ref 150–400)
RDW: 15.8 % — ABNORMAL HIGH (ref 11.5–15.5)
WBC: 6.8 10*3/uL (ref 4.0–10.5)

## 2011-12-25 LAB — BASIC METABOLIC PANEL
Calcium: 10.7 mg/dL — ABNORMAL HIGH (ref 8.4–10.5)
Chloride: 109 mEq/L (ref 96–112)
Creatinine, Ser: 0.75 mg/dL (ref 0.50–1.10)
GFR calc Af Amer: >90 mL/min (ref >90)

## 2011-12-25 MED ORDER — BIOTENE DRY MOUTH MT LIQD: 15.0000 mL | Freq: Two times a day (BID) | OROMUCOSAL | Status: DC

## 2011-12-25 MED ORDER — BIOTENE DRY MOUTH MT LIQD
15.0000 mL | Freq: Four times a day (QID) | OROMUCOSAL | Status: DC
  Administered 2011-12-25 – 2012-01-03 (×30): 15 mL via OROMUCOSAL

## 2011-12-25 NOTE — Progress Notes (Signed)
                                              5 Days Post-Op Procedure(s) (LRB): ESOPHAGECTOMY PARTIAL (N/A) Subjective: Wounds are okay. Chest x-ray stable. White count is 6800. Looks better overall. Will discontinue Foley. Will start clear liquids. We will transfer her tomorrow  Objective: Vital signs in last 24 hours: Temp:  [98 F (36.7 C)-99.6 F (37.6 C)] 98 F (36.7 C) (01/19 0736) Pulse Rate:  [95-135] 111  (01/19 0700) Cardiac Rhythm:  [-] Sinus tachycardia (01/18 2000) Resp:  [10-22] 15  (01/19 0700) BP: (90-162)/(40-75) 162/75 mmHg (01/19 0700) SpO2:  [96 %-100 %] 100 % (01/19 0700) FiO2 (%):  [1 %] 1 % (01/18 2024) Weight:  [68.4 kg (150 lb 12.7 oz)] 68.4 kg (150 lb 12.7 oz) (01/19 0620)  Hemodynamic parameters for last 24 hours:    Intake/Output from previous day: 01/18 0701 - 01/19 0700 In: 2342 [P.O.:120; I.V.:480; IV Piggyback:12] Out: 1595 [Urine:1195; Stool:400] Intake/Output this shift:    General appearance: alert Heart: regular rate and rhythm, S1, S2 normal, no murmur, click, rub or gallop Lungs: clear to auscultation bilaterally  Lab Results:  Basename 12/25/11 0430 12/24/11 0400  WBC 6.8 12.2*  HGB 8.4* 9.1*  HCT 27.8* 29.4*  PLT 212 239   BMET:  Basename 12/25/11 0430 12/24/11 0400  NA 144 141  K 4.2 4.2  CL 109 105  CO2 29 28  GLUCOSE 126* 137*  BUN 22 19  CREATININE 0.75 0.81  CALCIUM 10.7* 10.7*    PT/INR: No results found for this basename: LABPROT,INR in the last 72 hours ABG    Component Value Date/Time   PHART 7.374 12/21/2011 0400   HCO3 21.9 12/21/2011 0400   TCO2 23 12/21/2011 0400   ACIDBASEDEF 3.0* 12/21/2011 0400   O2SAT 95.0 12/21/2011 0400   CBG (last 3)  No results found for this basename: GLUCAP:3 in the last 72 hours  Assessment/Plan: S/P Procedure(s) (LRB): ESOPHAGECTOMY PARTIAL (N/A) Start clear liquids discontinue Foley.   LOS: 6 days    Mackenzi Krogh [facility] 12/25/2011

## 2011-12-26 ENCOUNTER — Inpatient Hospital Stay (HOSPITAL_COMMUNITY): Payer: Medicare Other

## 2011-12-26 LAB — CBC
HCT: 27.8 % — ABNORMAL LOW (ref 36.0–46.0)
Hemoglobin: 8.5 g/dL — ABNORMAL LOW (ref 12.0–15.0)
MCH: 29.2 pg (ref 26.0–34.0)
MCHC: 30.6 g/dL (ref 30.0–36.0)
MCV: 95.5 fL (ref 78.0–100.0)
Platelets: 224 10*3/uL (ref 150–400)
RBC: 2.91 MIL/uL — ABNORMAL LOW (ref 3.87–5.11)
RDW: 15.3 % (ref 11.5–15.5)
WBC: 8.5 10*3/uL (ref 4.0–10.5)

## 2011-12-26 LAB — BASIC METABOLIC PANEL
BUN: 21 mg/dL (ref 6–23)
CO2: 28 mEq/L (ref 19–32)
Calcium: 10.7 mg/dL — ABNORMAL HIGH (ref 8.4–10.5)
Chloride: 106 mEq/L (ref 96–112)
Creatinine, Ser: 0.66 mg/dL (ref 0.50–1.10)
GFR calc Af Amer: >90 mL/min (ref >90)
GFR calc non Af Amer: 84 mL/min — ABNORMAL LOW (ref >90)
Glucose, Bld: 132 mg/dL — ABNORMAL HIGH (ref 70–99)
Potassium: 4.1 mEq/L (ref 3.5–5.1)
Sodium: 141 mEq/L (ref 135–145)

## 2011-12-26 NOTE — Progress Notes (Signed)
                                              6 Days Post-Op Procedure(s) (LRB): ESOPHAGECTOMY PARTIAL (N/A) Subjective: Patient is afebrile. This chest x-ray shows good aeration. Laboratory is stable. Plan Gastrografin swallow tomorrow leaving unit one more day taking liquids slowly. Objective: Vital signs in last 24 hours: Temp:  [97.2 F (36.2 C)-98.4 F (36.9 C)] 98.4 F (36.9 C) (01/20 0724) Pulse Rate:  [95-129] 116  (01/20 0700) Cardiac Rhythm:  [-] Sinus tachycardia (01/20 0800) Resp:  [10-18] 13  (01/20 0700) BP: (121-170)/(51-97) 148/67 mmHg (01/20 0700) SpO2:  [97 %-100 %] 100 % (01/20 0809) FiO2 (%):  [2 %-100 %] 100 % (01/20 0128) Weight:  [68 kg (149 lb 14.6 oz)] 68 kg (149 lb 14.6 oz) (01/20 0500)  Hemodynamic parameters for last 24 hours:    Intake/Output from previous day: 01/19 0701 - 01/20 0700 In: 2150 [P.O.:60; I.V.:460; IV Piggyback:20] Out: 1730 [Urine:1720; Drains:10] Intake/Output this shift: Total I/O In: 150 [P.O.:60; I.V.:20; Other:70] Out: 200 [Urine:200]  General appearance: alert Lungs: clear to auscultation bilaterally Abdomen: soft, non-tender; bowel sounds normal; no masses,  no organomegaly  Lab Results:  Basename 12/26/11 0459 12/25/11 0430  WBC 8.5 6.8  HGB 8.5* 8.4*  HCT 27.8* 27.8*  PLT 224 212   BMET:  Basename 12/26/11 0459 12/25/11 0430  NA 141 144  K 4.1 4.2  CL 106 109  CO2 28 29  GLUCOSE 132* 126*  BUN 21 22  CREATININE 0.66 0.75  CALCIUM 10.7* 10.7*    PT/INR: No results found for this basename: LABPROT,INR in the last 72 hours ABG    Component Value Date/Time   PHART 7.374 12/21/2011 0400   HCO3 21.9 12/21/2011 0400   TCO2 23 12/21/2011 0400   ACIDBASEDEF 3.0* 12/21/2011 0400   O2SAT 95.0 12/21/2011 0400   CBG (last 3)  No results found for this basename: GLUCAP:3 in the last 72 hours  Assessment/Plan: S/P Procedure(s) (LRB): ESOPHAGECTOMY PARTIAL (N/A) The patient will get Gastrografin swallow tomorrow  is taking some sips of liquids leaving unit one more day   LOS: 7 days    Azelia Reiger [name] 12/26/2011

## 2011-12-27 ENCOUNTER — Inpatient Hospital Stay (HOSPITAL_COMMUNITY): Payer: Medicare Other

## 2011-12-27 MED ORDER — JEVITY 1.2 CAL PO LIQD
1000.0000 mL | ORAL | Status: DC
  Administered 2011-12-28: 1000 mL via ORAL
  Filled 2011-12-27 (×4): qty 1000

## 2011-12-27 MED ORDER — DIPHENHYDRAMINE HCL 12.5 MG/5ML PO ELIX
25.0000 mg | ORAL_SOLUTION | Freq: Four times a day (QID) | ORAL | Status: DC | PRN
  Administered 2011-12-27 – 2012-01-03 (×15): 25 mg
  Filled 2011-12-27 (×15): qty 10

## 2011-12-27 NOTE — Progress Notes (Signed)
                                              7 Days Post-Op Procedure(s) (LRB): ESOPHAGECTOMY PARTIAL (N/A) Subjective: Swallow looks good but delayed gastric emptying. Will transfer. Will increase diet and decrease tube feedings. Objective: Vital signs in last 24 hours: Temp:  [97.5 F (36.4 C)-98.4 F (36.9 C)] 98.2 F (36.8 C) (01/21 0747) Pulse Rate:  [94-133] 116  (01/21 0700) Cardiac Rhythm:  [-] Sinus tachycardia (01/20 2000) Resp:  [13-20] 16  (01/21 0700) BP: (109-163)/(52-80) 134/80 mmHg (01/21 0700) SpO2:  [94 %-100 %] 97 % (01/21 0700) Weight:  [68.3 kg (150 lb 9.2 oz)] 68.3 kg (150 lb 9.2 oz) (01/21 0300)  Hemodynamic parameters for last 24 hours:    Intake/Output from previous day: 01/20 0701 - 01/21 0700 In: 2480 [P.O.:300; I.V.:480; IV Piggyback:20] Out: 1470 [Urine:1450; Drains:20] Intake/Output this shift:    General appearance: alert  Lab Results:  Basename 12/26/11 0459 12/25/11 0430  WBC 8.5 6.8  HGB 8.5* 8.4*  HCT 27.8* 27.8*  PLT 224 212   BMET:  Basename 12/26/11 0459 12/25/11 0430  NA 141 144  K 4.1 4.2  CL 106 109  CO2 28 29  GLUCOSE 132* 126*  BUN 21 22  CREATININE 0.66 0.75  CALCIUM 10.7* 10.7*    PT/INR: No results found for this basename: LABPROT,INR in the last 72 hours ABG    Component Value Date/Time   PHART 7.374 12/21/2011 0400   HCO3 21.9 12/21/2011 0400   TCO2 23 12/21/2011 0400   ACIDBASEDEF 3.0* 12/21/2011 0400   O2SAT 95.0 12/21/2011 0400   CBG (last 3)  No results found for this basename: GLUCAP:3 in the last 72 hours  Assessment/Plan: S/P Procedure(s) (LRB): ESOPHAGECTOMY PARTIAL (N/A) Plan for transfer to step-down: see transfer orders   LOS: 8 days    [name] [name] 12/27/2011

## 2011-12-27 NOTE — Progress Notes (Signed)
UR Completed.  Zorian Gunderman Jane [phone] 12/27/2011  

## 2011-12-27 NOTE — Progress Notes (Signed)
Physical Therapy Treatment Patient Details Name: DECHELLE ATTAWAY MRN: [id number] DOB: July 05, 1934 Today's Date: 12/27/2011  PT Assessment/Plan  PT - Assessment/Plan Comments on Treatment Session: pt presents s/p partial esophagectomy.  pt much improved, however still with cues needed for safety.   PT Plan: Discharge plan remains appropriate;Frequency remains appropriate PT Frequency: Min 3X/week Recommendations for Other Services: OT consult Follow Up Recommendations: Supervision for mobility/OOB;Home health PT PT Goals  Acute Rehab PT Goals PT Goal: Rolling Supine to Left Side - Progress: Partly met PT Goal: Supine/Side to Sit - Progress: Partly met Pt will go Sit to Supine/Side: with supervision PT Goal: Sit to Supine/Side - Progress: Goal set today PT Goal: Sit to Stand - Progress: Progressing toward goal PT Goal: Stand to Sit - Progress: Progressing toward goal PT Goal: Ambulate - Progress: Progressing toward goal  PT Treatment Precautions/Restrictions  Precautions Precautions: Fall Restrictions Weight Bearing Restrictions: No Mobility (including Balance) Bed Mobility Bed Mobility: Yes Rolling Left: 5: Supervision Left Sidelying to Sit: 5: Supervision;With rails;HOB elevated (comment degrees) (HOB ~30) Sitting - Scoot to Edge of Bed: 5: Supervision Sit to Supine: 5: Supervision Transfers Transfers: Yes Sit to Stand: 4: Min assist;With upper extremity assist;From bed;From chair/3-in-1 Sit to Stand Details (indicate cue type and reason): cues for anterior wt shift Stand to Sit: 4: Min assist;To chair/3-in-1;With upper extremity assist;To bed Stand to Sit Details: cues to control descent Ambulation/Gait Ambulation/Gait: Yes Ambulation/Gait Assistance: 4: Min assist Ambulation/Gait Assistance Details (indicate cue type and reason): cues for safety with RW and attention to task.  pt tends to get distracted and joking with other people.   Ambulation Distance (Feet): 300  Feet Assistive device: Rolling walker Gait Pattern: Decreased stride length;Shuffle Stairs: No Wheelchair Mobility Wheelchair Mobility: No  Posture/Postural Control Posture/Postural Control: No significant limitations Balance Balance Assessed: No Exercise    End of Session PT - End of Session Equipment Utilized During Treatment: Gait belt Activity Tolerance: Patient tolerated treatment well Patient left: in bed;with call bell in reach Nurse Communication: Mobility status for transfers;Mobility status for ambulation General Behavior During Session: [facility] for tasks performed Cognition: [facility] for tasks performed  Sunny Schlein, [place] [phone] 12/27/2011, 3:04 PM

## 2011-12-28 ENCOUNTER — Inpatient Hospital Stay (HOSPITAL_COMMUNITY): Payer: Medicare Other

## 2011-12-28 LAB — COMPREHENSIVE METABOLIC PANEL
AST: 19 U/L (ref 0–37)
Albumin: 2.4 g/dL — ABNORMAL LOW (ref 3.5–5.2)
Alkaline Phosphatase: 72 U/L (ref 39–117)
Chloride: 105 mEq/L (ref 96–112)
Creatinine, Ser: 0.69 mg/dL (ref 0.50–1.10)
Potassium: 3.9 mEq/L (ref 3.5–5.1)
Sodium: 139 mEq/L (ref 135–145)
Total Bilirubin: 0.2 mg/dL — ABNORMAL LOW (ref 0.3–1.2)

## 2011-12-28 LAB — CBC
Platelets: 236 10*3/uL (ref 150–400)
RDW: 15.8 % — ABNORMAL HIGH (ref 11.5–15.5)
WBC: 8.2 10*3/uL (ref 4.0–10.5)

## 2011-12-28 MED ORDER — JEVITY 1.2 CAL PO LIQD
1000.0000 mL | ORAL | Status: DC
  Filled 2011-12-28 (×4): qty 1000

## 2011-12-28 MED ORDER — SODIUM CHLORIDE 0.9 % IJ SOLN
INTRAMUSCULAR | Status: AC
  Filled 2011-12-28: qty 20

## 2011-12-28 NOTE — Progress Notes (Addendum)
Nutrition Follow-up  S/p esophagram 1/20 -- no leak identified. Bedside swallow evaluation completed -- recommending NPO status, MBS procedure.  Jevity 1.2 formula at 50 ml/hr via J-tube providing 1440 kcals, 67 gm protein, 968 ml of free water.  Diet Order:  Full liquids. Per RN, consuming ~ 25% of meals.  Meds: Scheduled Meds:   . antiseptic oral rinse  15 mL Mouth Rinse QID  . cycloSPORINE  1 drop Both Eyes BID  . metoprolol tartrate  25 mg Oral BID  . pantoprazole (PROTONIX) IV  40 mg Intravenous Q12H  . sodium chloride  10 mL Intravenous Q12H  . sodium chloride  3 mL Intravenous Q12H   Continuous Infusions:   . 0.9 % NaCl with KCl 20 mEq / L 20 mL/hr at 12/27/11 2000  . feeding supplement (JEVITY 1.2)    . DISCONTD: feeding supplement (JEVITY 1.2) 1,000 mL (12/28/11 0424)   PRN Meds:.diphenhydrAMINE, fentaNYL, HYDROcodone-acetaminophen, metoprolol, ondansetron (ZOFRAN) IV, potassium chloride  Labs:  CMP     Component Value Date/Time   NA 139 12/28/2011 0400   K 3.9 12/28/2011 0400   CL 105 12/28/2011 0400   CO2 27 12/28/2011 0400   GLUCOSE 112* 12/28/2011 0400   BUN 21 12/28/2011 0400   CREATININE 0.69 12/28/2011 0400   CALCIUM 9.9 12/28/2011 0400   PROT 5.8* 12/28/2011 0400   ALBUMIN 2.4* 12/28/2011 0400   AST 19 12/28/2011 0400   ALT 19 12/28/2011 0400   ALKPHOS 72 12/28/2011 0400   BILITOT 0.2* 12/28/2011 0400   GFRNONAA 83* 12/28/2011 0400   GFRAA >90 12/28/2011 0400     Intake/Output Summary (Last 24 hours) at 12/28/11 1043 Last data filed at 12/28/11 0800  Gross per 24 hour  Intake   1886 ml  Output    550 ml  Net   1336 ml    Weight Status:  68.6 kg (1/22) -- stable  Re-estimated needs:  1800-2000 kcals, 105-115 gm protein  Nutrition Dx:  Inadequate Oral Intake now related to dysphagia (s/p esophagogastrectomy), ongoing   Goal:  EN to meet >90% of estimated nutrition needs given recommendation for current NPO status, unmet Monitor: EN regimen, diet order,  weight, labs, I/O's  Intervention:    Recommend changing EN regimen to Pivot 1.5 formula (immune modulating formula) at 50 ml/hr to provide 1800 kcals, 113 gm protein, 911 ml of free water  RD to follow for nutrition care plan  Alger Memos Pager #:  [phone]

## 2011-12-28 NOTE — Progress Notes (Signed)
Speech Language/Pathology Clinical/Bedside Swallow Evaluation Patient Details  Name: [name] MRN: [id number] DOB: 1934/08/03 Today's Date: 12/28/2011  Past Medical History:  Past Medical History  Diagnosis Date  . Pernicious anemia   . Osteoarthritis   . Fibromyalgia   . Skin lesion     chronic calcific cutaneous lesions  . HX of multiple bleeding ulcers 09/01/2011  . Hypertension     takes Atenolol daily  . Emphysema   . Shortness of breath     with exertion  . Bronchitis     hx  . Adenocarcinoma of gastroesophageal junction   . Dizziness     d/t crystals in ears that "roll around"and can't get them out  . Back pain   . Osteoporosis   . Scoliosis   . Psoriasis   . H/O hiatal hernia   . GERD (gastroesophageal reflux disease)     takes Aciphex daily  . Gastric ulcer   . Constipation     since Chemo and Radiation;finished up in Nov 2012  . Diverticulosis   . History of colonic polyps   . Shingles     broke out 2wks ago;has been on meds and to finish up tomorrow  . Kidney stone 25+yrs ago  . Pernicious anemia   . Vitamin B deficiency    Past Surgical History:  Past Surgical History  Procedure Date  . Right hip replacement 2001  . Tonsillectomy     as a child  . Tubal ligation 1975  . Appendectomy 1975  . Cataract surgery 10+yrs ago  . Esophagogastroduodenoscopy   . Colonoscopy   . Extubation 12/20/2011       . Partial esophagectomy 12/20/2011    Procedure: ESOPHAGECTOMY PARTIAL;  Surgeon: Norton Blizzard, MD;  Location: [facility] OR;  Service: Thoracic;  Laterality: N/A;   HPI:  Patient is a 78 year old Caucasian female who was found to have esophageal cancer Stage III (adenocarcinoma) . She underwent radiation and chemotherapy. She presented to [facility] to undergo a transhiatal esophagectomy 12/20/11, intubated but immediately extubated post surgery. Bedside swallow evaluation and patient education requested secondary to potential aspiration s/p surgery as  noted during barium swallow.    Assessment/Recommendations/Treatment Plan  Patient presents with significant overt s/s of aspiration on all po consistencies tested today, greatest with thin liquids characterized by immediate strong cough response; ? secondary to combination of delayed swallow initiation, decreased UES relaxations s/p recent surgery, esophageal residuals. Trials of pureed solids and nectar thick liquids decreased overt s/s of aspiration but did not fully prevent.  Severity of cough response indicative of aspiration increased with attempt at chin tuck. Patient with multiple risk factors for dysphagia and aspiration including GI history and recent surgery, post op edema, significant changes in vocal quality, and brief intubation. Ultimate recommendations from this SLP are for patient to remain NPO and proceed with objective swallowing evaluation (MBS) to evaluate origin of dysphagia, determine least restrictive diet, and potential for increased safety with use of compensatory strategies. If MD does not wish to proceed with objective evaluation, recommend thickening all liquids to nectar thick to decrease aspiration risk.   Risk for Aspiration: Severe Other Related Risk Factors: History of GERD;History of esophageal-related issues (recent esophageal surgery)  Swallow Evaluation Recommendations Recommended Consults: MBS Medication Administration: Crushed with puree (if MD wishes for patient to continue a po diet) Supervision: Full supervision/cueing for compensatory strategies;Patient able to self feed Compensations: Slow rate;Small sips/bites;Multiple dry swallows after each bite/sip (if MD wishes  to continue with po diet) Postural Changes and/or Swallow Maneuvers: Seated upright 90 degrees;Upright 30-60 min after meal  Treatment Plan Treatment Plan Recommendations: Therapy as outlined in treatment plan below Speech Therapy Frequency: min 3x week Treatment Duration: 2  weeks Interventions: Aspiration precaution training;Compensatory techniques;Patient/family education;Diet toleration management by SLP  Swallowing Goals  SLP Swallowing Goals Patient will consume recommended diet without observed clinical signs of aspiration with: Supervision/safety Swallow Study Goal #1 - Progress: Not Met Patient will utilize recommended strategies during swallow to increase swallowing safety with: Supervision/safety Swallow Study Goal #2 - Progress: Not met  Ferdinand Lango MA, CCC-SLP [phone]  Rithy Mandley Meryl 12/28/2011,11:15 AM

## 2011-12-28 NOTE — Progress Notes (Signed)
                   [address]            [place] [address]          [phone]     Per Speech therapy recs will make NPO for aspiration risk.   Assessment/Recommendations/Treatment Plan  Patient presents with significant overt s/s of aspiration on all po consistencies tested today, greatest with thin liquids characterized by immediate strong cough response; ? secondary to combination of delayed swallow initiation, decreased UES relaxations s/p recent surgery, esophageal residuals. Trials of pureed solids and nectar thick liquids decreased overt s/s of aspiration but did not fully prevent. Severity of cough response indicative of aspiration increased with attempt at chin tuck. Patient with multiple risk factors for dysphagia and aspiration including GI history and recent surgery, post op edema, significant changes in vocal quality, and brief intubation. Ultimate recommendations from this SLP are for patient to remain NPO and proceed with objective swallowing evaluation (MBS) to evaluate origin of dysphagia, determine least restrictive diet, and potential for increased safety with use of compensatory strategies. If MD does not wish to proceed with objective evaluation, recommend thickening all liquids to nectar thick to decrease aspiration risk.  Risk for Aspiration: Severe  Other Related Risk Factors: History of GERD;History of esophageal-related issues (recent esophageal surgery)  Swallow Evaluation Recommendations  Recommended Consults: MBS  Medication Administration: Crushed with puree (if MD wishes for patient to continue a po diet)  Supervision: Full supervision/cueing for compensatory strategies;Patient able to self feed  Compensations: Slow rate;Small sips/bites;Multiple dry swallows after each bite/sip (if MD wishes to continue with po diet)  Postural Changes and/or Swallow Maneuvers: Seated upright 90 degrees;Upright 30-60 min after meal  Treatment Plan  Treatment Plan  Recommendations: Therapy as outlined in treatment plan below  Speech Therapy Frequency: min 3x week  Treatment Duration: 2 weeks  Interventions: Aspiration precaution training;Compensatory techniques;Patient/family education;Diet toleration management by SLP  Swallowing Goals  SLP Swallowing Goals  Patient will consume recommended diet without observed clinical signs of aspiration with: Supervision/safety  Swallow Study Goal #1 - Progress: Not Met  Patient will utilize recommended strategies during swallow to increase swallowing safety with: Supervision/safety  Swallow Study Goal #2 - Progress: Not met  Ferdinand Lango MA, CCC-SLP  [phone]  [place] Leah Meryl  12/28/2011,11:15 AM

## 2011-12-29 MED ORDER — TIZANIDINE HCL 2 MG PO TABS
2.0000 mg | ORAL_TABLET | Freq: Two times a day (BID) | ORAL | Status: DC
  Administered 2011-12-29 – 2012-01-03 (×10): 2 mg via ORAL
  Filled 2011-12-29 (×13): qty 1

## 2011-12-29 MED ORDER — LORAZEPAM 2 MG/ML PO CONC
0.5000 mg | Freq: Four times a day (QID) | ORAL | Status: DC | PRN
  Administered 2011-12-30 – 2012-01-02 (×6): 0.5 mg
  Filled 2011-12-29: qty 1
  Filled 2011-12-29 (×2): qty 0.25
  Filled 2011-12-29: qty 1
  Filled 2011-12-29 (×3): qty 0.25

## 2011-12-29 MED ORDER — LORAZEPAM 0.5 MG PO TABS: 0.5000 mg | ORAL_TABLET | Freq: Three times a day (TID) | ORAL | Status: DC | PRN

## 2011-12-29 MED ORDER — METOPROLOL TARTRATE 25 MG/10 ML ORAL SUSPENSION
50.0000 mg | Freq: Two times a day (BID) | ORAL | Status: DC
  Administered 2011-12-29 – 2011-12-30 (×4): 50 mg
  Filled 2011-12-29 (×6): qty 20

## 2011-12-29 MED ORDER — LORAZEPAM 2 MG/ML PO CONC: 0.5000 mg | Freq: Three times a day (TID) | ORAL | Status: DC | PRN

## 2011-12-29 MED ORDER — PIVOT 1.5 CAL PO LIQD
1000.0000 mL | ORAL | Status: DC
  Administered 2011-12-29: 1000 mL via ORAL
  Filled 2011-12-29 (×3): qty 1000

## 2011-12-29 NOTE — Progress Notes (Signed)
                                              9 Days Post-Op Procedure(s) (LRB): ESOPHAGECTOMY PARTIAL (N/A) Subjective: Patient is stable. Still having some signs of aspiration. Will switch to dysphasia 3 diet will switch tube. It 1.5 wounds are okay plan discontinue rest of clips. Discharge planning and progress Vital signs in last 24 hours: Temp:  [98 F (36.7 C)-99.1 F (37.3 C)] 98.5 F (36.9 C) (01/23 0746) Pulse Rate:  [88-113] 112  (01/23 0746) Cardiac Rhythm:  [-] Sinus tachycardia (01/23 0800) Resp:  [16-23] 22  (01/23 0746) BP: (105-130)/(47-71) 126/49 mmHg (01/23 0746) SpO2:  [92 %-96 %] 94 % (01/23 0746) Weight:  [69.3 kg (152 lb 12.5 oz)] 69.3 kg (152 lb 12.5 oz) (01/22 1500)  Hemodynamic parameters for last 24 hours:    Intake/Output from previous day: 01/22 0701 - 01/23 0700 In: 1396 [I.V.:6; IV Piggyback:10] Out: 525 [Urine:325; Stool:200] Intake/Output this shift: Total I/O In: 50 [Other:50] Out: -   General appearance: alert Lungs: clear to auscultation bilaterally  Lab Results:  Basename 12/28/11 0400  WBC 8.2  HGB 8.0*  HCT 25.4*  PLT 236   BMET:  Basename 12/28/11 0400  NA 139  K 3.9  CL 105  CO2 27  GLUCOSE 112*  BUN 21  CREATININE 0.69  CALCIUM 9.9    PT/INR: No results found for this basename: LABPROT,INR in the last 72 hours ABG    Component Value Date/Time   PHART 7.374 12/21/2011 0400   HCO3 21.9 12/21/2011 0400   TCO2 23 12/21/2011 0400   ACIDBASEDEF 3.0* 12/21/2011 0400   O2SAT 95.0 12/21/2011 0400   CBG (last 3)  No results found for this basename: GLUCAP:3 in the last 72 hours  Assessment/Plan: S/P Procedure(s) (LRB): ESOPHAGECTOMY PARTIAL (N/A) Dysphagia 2 diet discontinue clips switch to pivot 1.5 at 50 cc an hour   LOS: 10 days    [place] [name] 12/29/2011

## 2011-12-29 NOTE — Progress Notes (Addendum)
Speech Pathology: Dysphagia Treatment Note  Patient was observed with : Pureed and Honey liquids, ice chips  Patient was noted to have s/s of aspiration : Yes  Lung Sounds:  CTA per MD notes Temperature: afebile  Patient required: mod-max cues to consistently follow precautions/strategies  Clinical Impression: Patient seen for f/u after bedside swallow evaluation 1/22. Noted diet has been ordered by MD (dysphagia 3, honey thick liquids). Patient continues to present with significant s/s of dysphagia with all consistencies observed, greatest with thin liquids including ice chips, characterized by wet vocal quality, hard, wet cough post swallow. Concerned that in addition to likely esophageal component, airway protection is compromised due to nerve involvement given location of surgery as evidenced by continued severely hoarse voice suggestive of poor vocal cord adduction. SLP provided education on general safe swallowing strategies including small bites and sips, upright positioning, hard effortful swallow, suggestions for softer solid po consistencies, and for ice chips in between meals only after oral care as per the free water protocol to decrease risk of aspiration PNA. At this time, unable to provide additional skilled education regarding potential postural changes or dietary recommendations to decrease aspiration risk without objective testing.   Recommendations:  1.  Strongly recommend MBS to objectively evaluate swallowing function, establish origin of dysphagia, least restrictive diet, and compensatory strategies which may decrease aspiration risks; discussed with Dr. Edwyna Shell who does not wish for objective testing at this time. SLP will f/u at bedside with goals noted below with focus on differential diagnosis of swallowing function and use of compensatory strategies to decreased overt s/s of aspiration.   Updated goals: 1. Patient will utilize compensatory strategies and aspiration  precautions taught by SLP to decrease overt s/s of aspiration and maximize potential with pos with min assist.   Pain:   none (patient verbalizing she is "scared to eat"-RN aware) Intervention Required:   No   Ferdinand Lango MA, CCC-SLP [phone]

## 2011-12-29 NOTE — Progress Notes (Signed)
                    [address]            [place] [address]          [phone]     9 Days Post-Op Procedure(s) (LRB): ESOPHAGECTOMY PARTIAL (N/A)  Subjective: "Rough night last night."  Rested poorly, very anxious.  Coughing a lot, sometimes productive of thick sputum.  Objective: Vital signs in last 24 hours: Patient Vitals for the past 24 hrs:  BP Temp Temp src Pulse Resp SpO2 Height Weight  12/29/11 0746 126/49 mmHg 98.5 F (36.9 C) Oral 112  22  94 % - -  12/29/11 0400 130/47 mmHg 98 F (36.7 C) Oral - - - - -  12/28/11 2349 124/51 mmHg 98.6 F (37 C) Oral 88  16  94 % - -  12/28/11 2258 116/55 mmHg - - 113  - - - -  12/28/11 2028 129/58 mmHg 99.1 F (37.3 C) - 113  19  96 % - -  12/28/11 1500 105/71 mmHg 98.2 F (36.8 C) Oral - - - 5\' 4"  (1.626 m) 69.3 kg (152 lb 12.5 oz)  12/28/11 1300 - - - - 16  - - -  12/28/11 1200 111/52 mmHg - - 89  16  92 % - -  12/28/11 1149 - 98.7 F (37.1 C) Oral - - - - -  12/28/11 1100 - - - - 21  - - -  12/28/11 1030 127/70 mmHg - - - - - - -  12/28/11 1000 - - - - 23  - - -  12/28/11 0900 - - - - 17  - - -   Current Weight  12/28/11 69.3 kg (152 lb 12.5 oz)     Intake/Output from previous day: 01/22 0701 - 01/23 0700 In: 1396 [I.V.:6; IV Piggyback:10] Out: 525 [Urine:325; Stool:200]    PHYSICAL EXAM:  Heart: RRR, tachy 120s Lungs: Diminished BS in bases bilaterally Wound: clean and dry, very scant drainage from L neck drain Abdomen: soft, NT/ND, J tube in place and functional  Lab Results: CBC: Basename 12/28/11 0400  WBC 8.2  HGB 8.0*  HCT 25.4*  PLT 236   BMET:  Basename 12/28/11 0400  NA 139  K 3.9  CL 105  CO2 27  GLUCOSE 112*  BUN 21  CREATININE 0.69  CALCIUM 9.9    PT/INR: No results found for this basename: LABPROT,INR in the last 72 hours   Assessment/Plan: S/P Procedure(s) (LRB): ESOPHAGECTOMY PARTIAL (N/A) Nutrition- gastrograffin swallow showed no leak, but bedside  eval showed + aspiration, therefore pt still NPO.  Continue TFs. Tachycardia- HR 100-130s, ST.  Will increase beta blocker dose and monitor.   Anxiety- pt takes a muscle relaxer bid for fibromyalgia and requests something for sleep/anxiety.  Will Rx Ativan prn. Continue PT/rehab.   LOS: 10 days    COLLINS,GINA H 12/29/2011

## 2011-12-29 NOTE — Progress Notes (Signed)
Patient discussed at the Long Length of Stay [name] Weeks 12/29/2011  

## 2011-12-29 NOTE — Progress Notes (Signed)
Physical Therapy Cancellation Patient Details Name: [name] MRN: [id number] DOB: [birth year] Today's Date: 12/29/2011  Pt refused to get OOB due to overall fatigue and wanted to continue to sleep.  RN notified.  Will attempt later this PM if time allows. Thanks!!  Dmarius Reeder 12/29/2011, 2:36 PM [phone]

## 2011-12-30 MED ORDER — PIVOT 1.5 CAL PO LIQD
1000.0000 mL | ORAL | Status: DC
  Administered 2011-12-30 – 2011-12-31 (×2): 1000 mL
  Filled 2011-12-30 (×6): qty 1000

## 2011-12-30 NOTE — Progress Notes (Signed)
CSW received referral for SNF placement, per 3300 Care Coordinator. CSW has initiated SNF search and will follow to facilitate d/c to SNF Monday.  Baxter Flattery, MSW [phone]

## 2011-12-30 NOTE — Progress Notes (Signed)
   CARE MANAGEMENT NOTE 12/30/2011  Patient:  [name], [name]   Account Number:  [id number]  Date Initiated:  12/20/2011  Documentation initiated by:  [facility]  Subjective/Objective Assessment:   Admitted for esophagectomy for CA.     Action/Plan:   PTA, PT INDEPENDENT, LIVES WITH SON.   Anticipated DC Date:  12/31/2011   Anticipated DC Plan:  HOME W HOME HEALTH SERVICES  In-house referral  Clinical Social Worker      DC Planning Services  CM consult      Choice offered to / List presented to:             Status of service:  In process, will continue to follow Medicare Important Message given?   (If response is "NO", the following Medicare IM given date fields will be blank) Date Medicare IM given:   Date Additional Medicare IM given:    Discharge Disposition:    Per UR Regulation:  Reviewed for med. necessity/level of care/duration of stay  Comments:  12/30/11 Rameen Gohlke,RN,BSN 1400 APPEARS PT WILL NEED SHORT TERM SNF FOR REHAB FOR DECONDITIONING.  REFERRAL TO CSW TO FACILITATE DC TO SNF WHEN MEDICALLY STABLE. (POSSIBLY MONDAY 1/28, PER MD. ) Phone #[phone]  12-27-11 2pm Johny Shears - [phone] UR completed.  12-22-11 8:30am Avie Arenas, RNBSN [phone] UR Completed. - post op esophagectomy.  12-20-11 8:20am Avie Arenas, RNBSN [phone] UR Completed.

## 2011-12-30 NOTE — Progress Notes (Addendum)
                    [address]            [place] [address]          [phone]     10 Days Post-Op Procedure(s) (LRB): ESOPHAGECTOMY PARTIAL (N/A)  Subjective: Rested much better last night.  Not eating much- c/o feeling too full to eat, and has a lot of anxiety about aspiration.  Objective: Vital signs in last 24 hours: Patient Vitals for the past 24 hrs:  BP Temp Temp src Pulse Resp SpO2  12/30/11 0345 134/58 mmHg 98.3 F (36.8 C) Oral - - -  12/29/11 2328 90/77 mmHg 98.1 F (36.7 C) Oral - - -  12/29/11 2158 121/89 mmHg - - 78  - -  12/29/11 2000 128/72 mmHg 98.5 F (36.9 C) Oral - - -  12/29/11 1600 98/61 mmHg 98.6 F (37 C) Oral 97  19  92 %  12/29/11 1200 133/57 mmHg 98.3 F (36.8 C) Oral 99  22  95 %  12/29/11 1133 133/57 mmHg - - 120  - -  12/29/11 0746 126/49 mmHg 98.5 F (36.9 C) Oral 112  22  94 %   Current Weight  12/28/11 69.3 kg (152 lb 12.5 oz)     Intake/Output from previous day: 01/23 0701 - 01/24 0700 In: 1053 [I.V.:3] Out: 206 [Urine:202; Stool:4]    PHYSICAL EXAM:  Heart:RRR Lungs: clear Wound: clean and dry Abdomen: soft, NT/ND, +BS, J tube functioning appropriately  Lab Results: CBC: Basename 12/28/11 0400  WBC 8.2  HGB 8.0*  HCT 25.4*  PLT 236   BMET:  Basename 12/28/11 0400  NA 139  K 3.9  CL 105  CO2 27  GLUCOSE 112*  BUN 21  CREATININE 0.69  CALCIUM 9.9    PT/INR: No results found for this basename: LABPROT,INR in the last 72 hours   Assessment/Plan: S/P Procedure(s) (LRB): ESOPHAGECTOMY PARTIAL (N/A) Nutrition- cont to encourage D3 diet as tolerated.  May need to switch to nocturnal TFs so she has a better appetite. HR better with increase in beta blocker. PT, SLP following. Disp- hopefully to SNF soon.  SW consulted BJ:[id number].   LOS: 11 days    COLLINS,GINA H 12/30/2011 Wounds ok. DC central line in am switch to nite time tube feedings.

## 2011-12-31 ENCOUNTER — Inpatient Hospital Stay (HOSPITAL_COMMUNITY): Payer: Medicare Other

## 2011-12-31 LAB — CBC
MCH: 28.5 pg (ref 26.0–34.0)
MCV: 93.8 fL (ref 78.0–100.0)
Platelets: 375 10*3/uL (ref 150–400)
RBC: 2.91 MIL/uL — ABNORMAL LOW (ref 3.87–5.11)

## 2011-12-31 LAB — BASIC METABOLIC PANEL
CO2: 28 mEq/L (ref 19–32)
Calcium: 10.4 mg/dL (ref 8.4–10.5)
Creatinine, Ser: 0.77 mg/dL (ref 0.50–1.10)
Glucose, Bld: 110 mg/dL — ABNORMAL HIGH (ref 70–99)

## 2011-12-31 MED ORDER — LEVALBUTEROL HCL 0.63 MG/3ML IN NEBU
0.6300 mg | INHALATION_SOLUTION | Freq: Four times a day (QID) | RESPIRATORY_TRACT | Status: DC | PRN
  Administered 2011-12-31: 0.63 mg via RESPIRATORY_TRACT
  Filled 2011-12-31 (×2): qty 3

## 2011-12-31 MED ORDER — METOPROLOL TARTRATE 25 MG/10 ML ORAL SUSPENSION
75.0000 mg | Freq: Two times a day (BID) | ORAL | Status: DC
  Administered 2011-12-31 – 2012-01-03 (×7): 75 mg
  Filled 2011-12-31 (×8): qty 30

## 2011-12-31 NOTE — Progress Notes (Addendum)
Speech Pathology: Dysphagia Treatment Note  Patient was observed with : Pureed and no liquids.  Patient was noted to have s/s of aspiration : Yes  Lung Sounds: coarse upper lobes, diminished lower lobes Temperature: afebrile  Patient required: moderate-max verbal cues to consistently follow precautions/strategies  Clinical Impression: Patient seen at bedside for f/u diet tolerance assessment with focus on use of compensatory strategies and aspiration precautions to minimize overt s/s of aspiration with current diet.  Vocal quality minimally improving, less hoarse and with some increase in intensity today. Continues to remain periodically wet indicative of aspiration however. Patient able to consume pos above with fewer s/s of aspiration than when last seen by SLP on 1/23 but continues to present with wet vocal quality and cough in approximately 50% of trials. Clinician provided verbal cuing for hard, effortful swallow and intermittent cough to decrease aspiration. At this time, chin tuck does not appear to be worsening or assisting in swallowing safety. Patient remains afebrile. Did note coarse upper lobe breath sounds but overall, patient appears to be tolerating diet. Will continue to f/u to encourage use of compensatory strategies.  Education complete with son and brother regarding current swallowing function, diet recommendations and plan; both verbalized understanding.   Recommendations:  1. SLP will continue to f/u 2. Encourage effortful swallow and intermittent cough to decrease aspiration risk.  Pain:   none Intervention Required:   No   Goals: Progressing  Ferdinand Lango MA, CCC-SLP [phone]

## 2011-12-31 NOTE — Progress Notes (Signed)
Physical Therapy Treatment Patient Details Name: [name] MRN: [id number] DOB: 10/14/1934 Today's Date: 12/31/2011  PT Assessment/Plan  PT - Assessment/Plan Comments on Treatment Session: Pt agreeable to work with PT this session after much encouragement.  Pt limited due to SOB overall fatigue during ambulation.  Son present and perfer pt go home rather than SNF.  Son doesn't believe pt will get the therapy and care needed at any SNF.  Explained that pt will have therapy at least 5 x week at [facility].  Pt does not feel comfortable about going home at this time but son still reluctant about SNF.  Continue to recommend SNF due to decondition.  If refuse then will need HHPT with 24 assistance and HH aid. PT Plan: Discharge plan remains appropriate;Frequency remains appropriate PT Frequency: Min 3X/week Follow Up Recommendations: Skilled nursing facility;Home health PT;Supervision/Assistance - 24 hour (SNF due to continue to be decondition vs HHPT with 24) Equipment Recommended: Defer to next venue PT Goals  Acute Rehab PT Goals PT Goal Formulation: With patient/family Time For Goal Achievement: 2 weeks Pt will go Sit to Stand: with supervision PT Goal: Sit to Stand - Progress: Progressing toward goal Pt will go Stand to Sit: with supervision PT Goal: Stand to Sit - Progress: Progressing toward goal Pt will Ambulate: >150 feet;with supervision;with least restrictive assistive device PT Goal: Ambulate - Progress: Progressing toward goal  PT Treatment Precautions/Restrictions  Precautions Precautions: Fall Restrictions Weight Bearing Restrictions: No Mobility (including Balance) Bed Mobility Bed Mobility: No Transfers Transfers: Yes Sit to Stand: 4: Min assist;From bed;From chair/3-in-1;With armrests Sit to Stand Details (indicate cue type and reason): minguard for safety with cues for hand placement Stand to Sit: 5: Supervision;To bed;To chair/3-in-1;With armrests Stand to Sit  Details: minguard for safety with cues for hand placement Ambulation/Gait Ambulation/Gait: Yes Ambulation/Gait Assistance: 4: Min assist Ambulation/Gait Assistance Details (indicate cue type and reason): (A) to maintain balance and manage RW during ambulation.  Pt needed two seated rest breaks due to fatigue and SOB with SaO2 decreasing to 83% on RA and HR increasing to 140s.  Pt was returned to sitting. Ambulation Distance (Feet): 50 Feet Assistive device: Rolling walker Gait Pattern: Decreased stride length;Shuffle    Exercise    End of Session PT - End of Session Equipment Utilized During Treatment: Gait belt Activity Tolerance: Patient limited by fatigue Patient left: in chair;with call bell in reach;with family/visitor present Nurse Communication: Mobility status for transfers;Mobility status for ambulation General Behavior During Session: [facility] for tasks performed Cognition: [facility] for tasks performed  [name] 12/31/2011, 1:51 PM [phone]

## 2011-12-31 NOTE — Progress Notes (Addendum)
                    [address]            [place] [address]          [phone]     11 Days Post-Op Procedure(s) (LRB): ESOPHAGECTOMY PARTIAL (N/A)  Subjective: Feels better overall today.  Ate a little better yesterday, and throat is less sore.    Objective: Vital signs in last 24 hours: Patient Vitals for the past 24 hrs:  BP Temp Temp src Pulse SpO2  12/31/11 0716 - 98.3 F (36.8 C) Oral - -  12/31/11 0400 136/86 mmHg 98.3 F (36.8 C) Oral - -  12/30/11 2330 - 98 F (36.7 C) Oral - -  12/30/11 2200 139/59 mmHg - - 114  -  12/30/11 2000 123/90 mmHg 98.6 F (37 C) Oral - 94 %  12/30/11 1600 110/58 mmHg 98 F (36.7 C) Oral - -  12/30/11 1150 134/56 mmHg 98.2 F (36.8 C) Oral - -  12/30/11 1019 140/57 mmHg - - 118  -   Current Weight  12/28/11 69.3 kg (152 lb 12.5 oz)     Intake/Output from previous day: 01/24 0701 - 01/25 0700 In: 1103 [I.V.:3] Out: 450 [Urine:450]    PHYSICAL EXAM:  Heart: tachy, RRR 110-120s Lungs: clear Wound: clean and dry Abdomen: soft, NT/ND, +BS, J tube ok  Lab Results: CBC: Basename 12/31/11 0400  WBC 10.0  HGB 8.3*  HCT 27.3*  PLT 375   BMET:  Basename 12/31/11 0400  NA 140  K 4.3  CL 104  CO2 28  GLUCOSE 110*  BUN 24*  CREATININE 0.77  CALCIUM 10.4   CXR: stable  Assessment/Plan: S/P Procedure(s) (LRB): ESOPHAGECTOMY PARTIAL (N/A) Nutrition- doing better with D3 diet.  Will switch to nocturnal TFs tonight. CV- still very tachycardic.  Will increase Lopressor and monitor. Disp- hopefully to SNF on Monday. ABL anemia- continue to monitor.  LOS: 12 days    COLLINS,GINA H 12/31/2011  Agree with above. Laboratory is stable. Chest x-ray shows small effusion. Doing better overall.FL-2.

## 2011-12-31 NOTE — Progress Notes (Signed)
CSW completed psychosocial assessment, placed in shadow chart. Pt with bed offer at [facility], pt son pleased with this option. Tentative plan for d/c to [facility] on Monday and CSW will continue to follow.  Baxter Flattery, MSW [phone]

## 2012-01-01 MED ORDER — PIVOT 1.5 CAL PO LIQD
1000.0000 mL | ORAL | Status: DC
  Administered 2012-01-01: 1000 mL
  Filled 2012-01-01 (×4): qty 1000

## 2012-01-01 NOTE — Progress Notes (Signed)
                                              12 Days Post-Op Procedure(s) (LRB): ESOPHAGECTOMY PARTIAL (N/A) Subjective: To go to nursing home Monday. Continue nighttime feedings. Wounds are okay. We'll wean oxygen.  Objective: Vital signs in last 24 hours: Temp:  [98.4 F (36.9 C)-99 F (37.2 C)] 99 F (37.2 C) (01/26 0400) Pulse Rate:  [83-140] 83  (01/26 0000) Cardiac Rhythm:  [-] Normal sinus rhythm (01/26 0400) Resp:  [26] 26  (01/25 1145) BP: (98-112)/(43-70) 100/68 mmHg (01/26 0400) SpO2:  [83 %-97 %] 94 % (01/26 0400)  Hemodynamic parameters for last 24 hours:    Intake/Output from previous day: 01/25 0701 - 01/26 0700 In: 600  Out: 1000 [Urine:1000] Intake/Output this shift:    General appearance: alert Heart: regular rate and rhythm, S1, S2 normal, no murmur, click, rub or gallop Lungs: clear to auscultation bilaterally Abdomen: soft, non-tender; bowel sounds normal; no masses,  no organomegaly  Lab Results:  Basename 12/31/11 0400  WBC 10.0  HGB 8.3*  HCT 27.3*  PLT 375   BMET:  Basename 12/31/11 0400  NA 140  K 4.3  CL 104  CO2 28  GLUCOSE 110*  BUN 24*  CREATININE 0.77  CALCIUM 10.4    PT/INR: No results found for this basename: LABPROT,INR in the last 72 hours ABG    Component Value Date/Time   PHART 7.374 12/21/2011 0400   HCO3 21.9 12/21/2011 0400   TCO2 23 12/21/2011 0400   ACIDBASEDEF 3.0* 12/21/2011 0400   O2SAT 95.0 12/21/2011 0400   CBG (last 3)  No results found for this basename: GLUCAP:3 in the last 72 hours  Assessment/Plan: S/P Procedure(s) (LRB): ESOPHAGECTOMY PARTIAL (N/A) We are awaiting oxygen to increase diet nursing home on Monday   LOS: 13 days    [name] 01/01/2012

## 2012-01-02 ENCOUNTER — Inpatient Hospital Stay (HOSPITAL_COMMUNITY): Payer: Medicare Other

## 2012-01-02 MED ORDER — DIPHENHYDRAMINE HCL 12.5 MG/5ML PO ELIX: 25.0000 mg | ORAL_SOLUTION | Freq: Four times a day (QID) | ORAL | Status: DC | PRN

## 2012-01-02 MED ORDER — ONDANSETRON HCL 4 MG/2ML IJ SOLN: 4.0000 mg | Freq: Four times a day (QID) | INTRAMUSCULAR | Status: DC | PRN

## 2012-01-02 MED ORDER — METOCLOPRAMIDE HCL 5 MG/5ML PO SOLN
10.0000 mg | Freq: Two times a day (BID) | ORAL | Status: DC
  Administered 2012-01-02 – 2012-01-03 (×3): 10 mg
  Filled 2012-01-02 (×4): qty 10

## 2012-01-02 MED ORDER — TIZANIDINE HCL 4 MG PO TABS: 2.0000 mg | ORAL_TABLET | Freq: Two times a day (BID) | ORAL | Status: DC

## 2012-01-02 MED ORDER — METOPROLOL TARTRATE 25 MG/10 ML ORAL SUSPENSION: 75.0000 mg | Freq: Two times a day (BID) | ORAL | Status: DC

## 2012-01-02 MED ORDER — HYDROCODONE-ACETAMINOPHEN 7.5-500 MG/15ML PO SOLN: 10.0000 mL | Freq: Four times a day (QID) | ORAL | Status: AC | PRN

## 2012-01-02 MED ORDER — PIVOT 1.5 CAL PO LIQD: 1000.0000 mL | ORAL | Status: DC

## 2012-01-02 MED ORDER — METOCLOPRAMIDE HCL 5 MG/5ML PO SOLN: 10.0000 mg | Freq: Two times a day (BID) | ORAL | Status: DC

## 2012-01-02 MED ORDER — LORAZEPAM 2 MG/ML PO CONC: 0.5000 mg | Freq: Four times a day (QID) | ORAL | Status: AC | PRN

## 2012-01-02 MED ORDER — PIVOT 1.5 CAL PO LIQD
1000.0000 mL | ORAL | Status: DC
  Administered 2012-01-02: 1000 mL
  Filled 2012-01-02 (×4): qty 1000

## 2012-01-02 NOTE — Discharge Instructions (Signed)
No driving, heavy lifting or strenuous activity Incisions may be cleaned daily with soap and water All medications per jejunostomy tube Continue tube feedings as prescribed Continue dysphagia 3 diet with honey thick liquids

## 2012-01-02 NOTE — Discharge Summary (Signed)
[address]            [place] [address]          [phone]         Discharge Summary  Name: [name] DOB: [birth year] 78 y.o. MRN: [id number]  Admission Date: 12/19/2011 Discharge Date:    Admitting Diagnosis:  Esophageal adenocarcinoma  Discharge Diagnosis:   Stage IIIA Esophageal adenocarcinoma  Pernicious anemia  Osteoarthritis  Fibromyalgia  Gastroesophageal reflux  History of bleeding peptic ulcers  Hypertension  Emphysema  History of bronchitis  History of recent herpes zoster  Osteoporosis  Diverticulosis  History of colon polyps  History of kidney stones  History of tobacco use  Postoperative deconditioning  Postoperative acute blood loss anemia  Postoperative dysphagia/aspiration  Procedures: Procedure(s): Transhiatal esophagogastrectomy, jejunostomy, and pyloroplasty on 12/20/2011   HPI:  The patient is a 78 y.o. female with a long history of peptic ulcer disease and gastroesophageal reflux. She has a one-year history of solid food and liquid dysphagia. She has had previous esophageal dilatations. She underwent endoscopy that revealed a 2-3 cm fungating mass at the gastroesophageal junction. Biopsy revealed adenocarcinoma. She was seen by Dr. Truett Perna as well as the radiation oncologist and radiation and chemotherapy were started. She was referred to Dr. Edwyna Shell for surgical evaluation. PET scan shows a 4 cm area in the distal esophagus but no metastatic disease. He recommended proceeding with esophagectomy.  All risks, benefits and alternatives of surgery were explained in detail, and the patient agreed to proceed.    Hospital Course:  The patient was admitted to [facility] on 12/19/2011. The patient was taken to the operating room and underwent the above procedure.    The postoperative course has generally been uncomplicated. She remained in the ICU for the initial postoperative period for  observation and has ultimately been transferred to the step down unit. She was started on tube feeds through her jejunostomy and these have been titrated up to goal. Gastrografin swallow showed no evidence of anastomotic leak. A bedside swallow study however did show evidence of aspiration. The patient has slowly been started on a by mouth diet and presently is tolerating a dysphagia 3 diet with honey thickened liquids. She has had some postoperative tachycardia and was started on a beta blocker she is tolerating well. She has been very deconditioned and is working with physical therapy. She has remained afebrile and her vital signs and stable. She continues to require one to 2 L of supplemental oxygen to maintain sats greater than 90% and she has been treated with aggressive pulmonary toilet measures. It is felt that she will require short-term placement in a skilled nursing facility before she is able to discharge to home. She currently is medically stable and ready for discharge.   Recent vital signs:  Filed Vitals:   01/02/12 0400  BP: 140/48  Pulse:   Temp: 98 F (36.7 C)  Resp:     Recent laboratory studies:  CBC: Basename 12/31/11 0400  WBC 10.0  HGB 8.3*  HCT 27.3*  PLT 375   BMET:  Basename 12/31/11 0400  NA 140  K 4.3  CL 104  CO2 28  GLUCOSE 110*  BUN 24*  CREATININE 0.77  CALCIUM 10.4    PT/INR: No results found for this basename: LABPROT,INR in the last 72 hours  Discharge Medications:   Medication List  As of 01/02/2012  9:58 AM   STOP taking these medications         atenolol 50 MG tablet      Calcium-Vitamin D-Vitamin K 500-100-40 MG-UNT-MCG Chew      cyanocobalamin 1000 MCG/ML injection      diphenhydrAMINE 25 MG tablet      fluorometholone 0.1 % ophthalmic suspension      HYDROcodone-acetaminophen 7.5-325 MG per tablet      predniSONE 1 MG tablet      RABEprazole 20 MG tablet      raloxifene 60 MG tablet      [employer] Johns Wort 300 MG Caps       traMADol 50 MG tablet      valACYclovir 1000 MG tablet         TAKE these medications         cycloSPORINE 0.05 % ophthalmic emulsion   Commonly known as: RESTASIS   Place 1 drop into both eyes 2 (two) times daily.      diphenhydrAMINE 12.5 MG/5ML elixir   Commonly known as: BENADRYL   Place 10 mLs (25 mg total) into feeding tube every 6 (six) hours as needed for itching or sleep (use with hydrocodone).      feeding supplement (PIVOT 1.5 CAL) Liqd   Place 1,000 mLs into feeding tube continuous.      HYDROcodone-acetaminophen 7.5-500 MG/15ML solution   Commonly known as: LORTAB   Place 10 mLs into feeding tube every 6 (six) hours as needed for pain.      LORazepam 2 MG/ML concentrated solution   Commonly known as: ATIVAN   Place 0.3 mLs (0.6 mg total) into feeding tube every 6 (six) hours as needed.      metoCLOPramide 5 MG/5ML solution   Commonly known as: REGLAN   Place 10 mLs (10 mg total) into feeding tube 2 (two) times daily.      metoprolol tartrate 25 mg/10 mL Susp   Commonly known as: LOPRESSOR   Place 30 mLs (75 mg total) into feeding tube 2 (two) times daily.      tiZANidine 4 MG tablet   Commonly known as: ZANAFLEX   Place 0.5 tablets (2 mg total) into feeding tube 2 (two) times daily.            Discharge Instructions:  The patient is to refrain from driving, heavy lifting or strenuous activity.  May  clean incisions with soap and water.  Continue dysphagia 3 diet with honey thickened liquids. Continue aspiration precautions. All medications should be given via the jejunostomy tube. Continue nocturnal tube feeds with pivot 1000 mL's via jejunostomy tube continuously from 5p-7a.   Follow-up Information    Follow up with Cameron Proud, MD. (Office will arrange follow-up)    Contact information:   [address] [place] [address] [phone]         Followup as directed with Dr. Truett Perna.  Tayleigh Wetherell  H 01/02/2012, 9:58 AM

## 2012-01-02 NOTE — Progress Notes (Signed)
                                              13 Days Post-Op Procedure(s) (LRB): ESOPHAGECTOMY PARTIAL (N/A) Subjective: Eating poorly. She complains of nausea and pain. WIll add reglan. Continue same tube feedings. Hope to [employer].  Objective: Vital signs in last 24 hours: Temp:  [97.9 F (36.6 C)-98.7 F (37.1 C)] 98 F (36.7 C) (01/27 0400) Pulse Rate:  [86-115] 96  (01/26 2356) Cardiac Rhythm:  [-] Normal sinus rhythm (01/27 0400) Resp:  [15-24] 18  (01/26 1610) BP: (88-140)/(46-82) 140/48 mmHg (01/27 0400) SpO2:  [81 %-96 %] 89 % (01/26 1610)  Hemodynamic parameters for last 24 hours:    Intake/Output from previous day: 01/26 0701 - 01/27 0700 In: 1218 [P.O.:240; I.V.:3] Out: -  Intake/Output this shift:    General appearance: alert Lungs: clear to auscultation bilaterally Abdomen: soft, non-tender; bowel sounds normal; no masses,  no organomegaly  Lab Results:  Basename 12/31/11 0400  WBC 10.0  HGB 8.3*  HCT 27.3*  PLT 375   BMET:  Basename 12/31/11 0400  NA 140  K 4.3  CL 104  CO2 28  GLUCOSE 110*  BUN 24*  CREATININE 0.77  CALCIUM 10.4    PT/INR: No results found for this basename: LABPROT,INR in the last 72 hours ABG    Component Value Date/Time   PHART 7.374 12/21/2011 0400   HCO3 21.9 12/21/2011 0400   TCO2 23 12/21/2011 0400   ACIDBASEDEF 3.0* 12/21/2011 0400   O2SAT 95.0 12/21/2011 0400   CBG (last 3)  No results found for this basename: GLUCAP:3 in the last 72 hours  Assessment/Plan: S/P Procedure(s) (LRB): ESOPHAGECTOMY PARTIAL (N/A) Add reglan and zofran.   LOS: 14 days    Blondine Hottel [name] 01/02/2012

## 2012-01-03 LAB — COMPREHENSIVE METABOLIC PANEL
ALT: 11 U/L (ref 0–35)
Albumin: 2.5 g/dL — ABNORMAL LOW (ref 3.5–5.2)
Alkaline Phosphatase: 79 U/L (ref 39–117)
BUN: 27 mg/dL — ABNORMAL HIGH (ref 6–23)
Potassium: 4.5 mEq/L (ref 3.5–5.1)
Total Protein: 6.4 g/dL (ref 6.0–8.3)

## 2012-01-03 LAB — CBC
HCT: 26.8 % — ABNORMAL LOW (ref 36.0–46.0)
Hemoglobin: 8.2 g/dL — ABNORMAL LOW (ref 12.0–15.0)
MCH: 29 pg (ref 26.0–34.0)
MCV: 94.7 fL (ref 78.0–100.0)
Platelets: 448 10*3/uL — ABNORMAL HIGH (ref 150–400)
RBC: 2.83 MIL/uL — ABNORMAL LOW (ref 3.87–5.11)
WBC: 8.6 10*3/uL (ref 4.0–10.5)

## 2012-01-03 MED ORDER — SODIUM CHLORIDE 0.9 % IJ SOLN
INTRAMUSCULAR | Status: AC
  Filled 2012-01-03: qty 20

## 2012-01-03 MED ORDER — PIVOT 1.5 CAL PO LIQD: 1000.0000 mL | ORAL | Status: DC

## 2012-01-03 NOTE — Progress Notes (Signed)
                                              14 Days Post-Op Procedure(s) (LRB): ESOPHAGECTOMY PARTIAL (N/A) Subjective: Laboratory is stable except for slightly increased BUN. Will increase free water per tube. OK to DC.  Objective: Vital signs in last 24 hours: Temp:  [98.4 F (36.9 C)-98.8 F (37.1 C)] 98.8 F (37.1 C) (01/27 2320) Pulse Rate:  [87-117] 108  (01/28 0330) Cardiac Rhythm:  [-] Sinus tachycardia (01/28 0330) Resp:  [15-23] 22  (01/28 0330) BP: (96-132)/(43-71) 120/43 mmHg (01/28 0330) SpO2:  [68 %-97 %] 91 % (01/28 0330)  Hemodynamic parameters for last 24 hours:    Intake/Output from previous day: 01/27 0701 - 01/28 0700 In: 430 [P.O.:360] Out: 350 [Urine:350] Intake/Output this shift:    General appearance: alert Heart: regular rate and rhythm, S1, S2 normal, no murmur, click, rub or gallop Lungs: clear to auscultation bilaterally  Lab Results:  Basename 01/03/12 0600  WBC 8.6  HGB 8.2*  HCT 26.8*  PLT 448*   BMET:  Basename 01/03/12 0600  NA 142  K 4.5  CL 104  CO2 30  GLUCOSE 120*  BUN 27*  CREATININE 0.82  CALCIUM 10.4    PT/INR: No results found for this basename: LABPROT,INR in the last 72 hours ABG    Component Value Date/Time   PHART 7.374 12/21/2011 0400   HCO3 21.9 12/21/2011 0400   TCO2 23 12/21/2011 0400   ACIDBASEDEF 3.0* 12/21/2011 0400   O2SAT 95.0 12/21/2011 0400   CBG (last 3)  No results found for this basename: GLUCAP:3 in the last 72 hours  Assessment/Plan: S/P Procedure(s) (LRB): ESOPHAGECTOMY PARTIAL (N/A) Plan for discharge: see discharge orders   LOS: 15 days    Phill Steck [name] 01/03/2012

## 2012-01-03 NOTE — Progress Notes (Signed)
  Physical Therapy Treatment Patient Details Name: [name] MRN: [id number] DOB: [birth year] Today's Date: 01/03/2012  PT Assessment/Plan  PT - Assessment/Plan Comments on Treatment Session: Pt ablet to increase ambulation distance however continues to be limited due to increase HR and decrease O2 with ambulation. PT Plan: Discharge plan remains appropriate;Frequency remains appropriate PT Frequency: Min 3X/week Recommendations for Other Services: OT consult Follow Up Recommendations: Skilled nursing facility;Home health PT;Supervision/Assistance - 24 hour Equipment Recommended: Defer to next venue PT Goals  Acute Rehab PT Goals PT Goal Formulation: With patient/family Time For Goal Achievement: 2 weeks Pt will Roll Supine to Left Side: with supervision PT Goal: Rolling Supine to Left Side - Progress: Progressing toward goal Pt will go Supine/Side to Sit: with supervision PT Goal: Supine/Side to Sit - Progress: Progressing toward goal Pt will go Sit to Stand: with supervision PT Goal: Sit to Stand - Progress: Progressing toward goal Pt will go Stand to Sit: with supervision PT Goal: Stand to Sit - Progress: Progressing toward goal Pt will Ambulate: >150 feet;with supervision;with least restrictive assistive device PT Goal: Ambulate - Progress: Progressing toward goal  PT Treatment Precautions/Restrictions  Precautions Precautions: Fall Restrictions Weight Bearing Restrictions: No Mobility (including Balance) Bed Mobility Bed Mobility: Yes Supine to Sit: 4: Min assist Supine to Sit Details (indicate cue type and reason): (A) to elevate trunk OOB with cues for hand placement Sitting - Scoot to Edge of Bed: 5: Supervision Transfers Transfers: Yes Sit to Stand: 4: Min assist;From bed;From chair/3-in-1 Sit to Stand Details (indicate cue type and reason): Minguard for safety with cues for hand placement Stand to Sit: 5: Supervision;To bed;To chair/3-in-1;With  armrests Stand to Sit Details: Supervision for safety Ambulation/Gait Ambulation/Gait: Yes Ambulation/Gait Assistance: 4: Min assist Ambulation/Gait Assistance Details (indicate cue type and reason): (A) to maintain balance with RW.  Pt ambulate to rest room and back and become SOB.  Pt then ambulate ~50 x 2 with seated rest break in between ~2 minutes.  Pt's HR continues to increase to 140s during ambulation and is immediately return to sitting.  Pt's O2 drops as well to 80% during ambulation Ambulation Distance (Feet): 125 Feet Assistive device: Rolling walker Gait Pattern: Decreased stride length;Shuffle Stairs: No Wheelchair Mobility Wheelchair Mobility: No  Posture/Postural Control Posture/Postural Control: No significant limitations Balance Balance Assessed: No Exercise    End of Session PT - End of Session Equipment Utilized During Treatment: Gait belt Activity Tolerance: Patient limited by fatigue Patient left: in chair;with call bell in reach;with family/visitor present Nurse Communication: Mobility status for transfers;Mobility status for ambulation General Behavior During Session: [name] for tasks performed Cognition: [name] for tasks performed  [name] 01/03/2012, 2:02 PM [phone]

## 2012-01-03 NOTE — Progress Notes (Addendum)
Pt to d/c to [facility] SNF today after lunch. RNCM working to arrange for 24 hour supply of pivot, per request of SNF (they are unable to receive delivery until tomorrow). Pending this matter, CSW will coordinate transfer this afternoon.  Baxter Flattery, MSW [phone]  CSW called [facility] for transport at 1330. RN to send bottle of Pivot with pt to SNF. CSW signing off.

## 2012-01-15 ENCOUNTER — Inpatient Hospital Stay (HOSPITAL_COMMUNITY)
Admission: EM | Admit: 2012-01-15 | Discharge: 2012-01-31 | DRG: 394 | Disposition: A | Discharge status: Skilled Nursing Facility | Payer: Medicare Other | Attending: Cardiothoracic Surgery | Admitting: Cardiothoracic Surgery

## 2012-01-15 ENCOUNTER — Emergency Department (HOSPITAL_COMMUNITY): Payer: Medicare Other

## 2012-01-15 ENCOUNTER — Inpatient Hospital Stay (HOSPITAL_COMMUNITY): Payer: Medicare Other

## 2012-01-15 DIAGNOSIS — Z418 Encounter for other procedures for purposes other than remedying health state: Secondary | ICD-10-CM

## 2012-01-15 DIAGNOSIS — I10 Essential (primary) hypertension: Secondary | ICD-10-CM | POA: Diagnosis present

## 2012-01-15 DIAGNOSIS — R0789 Other chest pain: Secondary | ICD-10-CM | POA: Diagnosis not present

## 2012-01-15 DIAGNOSIS — F329 Major depressive disorder, single episode, unspecified: Secondary | ICD-10-CM | POA: Diagnosis present

## 2012-01-15 DIAGNOSIS — IMO0002 Reserved for concepts with insufficient information to code with codable children: Principal | ICD-10-CM | POA: Diagnosis present

## 2012-01-15 DIAGNOSIS — R1312 Dysphagia, oropharyngeal phase: Secondary | ICD-10-CM | POA: Diagnosis present

## 2012-01-15 DIAGNOSIS — Z860100 Personal history of colon polyps, unspecified: Secondary | ICD-10-CM

## 2012-01-15 DIAGNOSIS — Z87891 Personal history of nicotine dependence: Secondary | ICD-10-CM

## 2012-01-15 DIAGNOSIS — F3289 Other specified depressive episodes: Secondary | ICD-10-CM | POA: Diagnosis present

## 2012-01-15 DIAGNOSIS — Z8501 Personal history of malignant neoplasm of esophagus: Secondary | ICD-10-CM

## 2012-01-15 DIAGNOSIS — M810 Age-related osteoporosis without current pathological fracture: Secondary | ICD-10-CM | POA: Diagnosis present

## 2012-01-15 DIAGNOSIS — K219 Gastro-esophageal reflux disease without esophagitis: Secondary | ICD-10-CM | POA: Diagnosis present

## 2012-01-15 DIAGNOSIS — Z9849 Cataract extraction status, unspecified eye: Secondary | ICD-10-CM

## 2012-01-15 DIAGNOSIS — Z79899 Other long term (current) drug therapy: Secondary | ICD-10-CM

## 2012-01-15 DIAGNOSIS — I498 Other specified cardiac arrhythmias: Secondary | ICD-10-CM | POA: Diagnosis not present

## 2012-01-15 DIAGNOSIS — I4430 Unspecified atrioventricular block: Secondary | ICD-10-CM | POA: Diagnosis present

## 2012-01-15 DIAGNOSIS — A0472 Enterocolitis due to Clostridium difficile, not specified as recurrent: Secondary | ICD-10-CM | POA: Diagnosis present

## 2012-01-15 DIAGNOSIS — M1990 Unspecified osteoarthritis, unspecified site: Secondary | ICD-10-CM | POA: Diagnosis present

## 2012-01-15 DIAGNOSIS — E876 Hypokalemia: Secondary | ICD-10-CM | POA: Diagnosis not present

## 2012-01-15 DIAGNOSIS — M4120 Other idiopathic scoliosis, site unspecified: Secondary | ICD-10-CM | POA: Diagnosis present

## 2012-01-15 DIAGNOSIS — R55 Syncope and collapse: Secondary | ICD-10-CM | POA: Diagnosis not present

## 2012-01-15 DIAGNOSIS — Z96649 Presence of unspecified artificial hip joint: Secondary | ICD-10-CM

## 2012-01-15 DIAGNOSIS — IMO0001 Reserved for inherently not codable concepts without codable children: Secondary | ICD-10-CM | POA: Diagnosis present

## 2012-01-15 DIAGNOSIS — K9423 Gastrostomy malfunction: Secondary | ICD-10-CM

## 2012-01-15 DIAGNOSIS — Z2989 Encounter for other specified prophylactic measures: Secondary | ICD-10-CM

## 2012-01-15 DIAGNOSIS — C160 Malignant neoplasm of cardia: Secondary | ICD-10-CM

## 2012-01-15 DIAGNOSIS — D649 Anemia, unspecified: Secondary | ICD-10-CM | POA: Diagnosis present

## 2012-01-15 DIAGNOSIS — J438 Other emphysema: Secondary | ICD-10-CM | POA: Diagnosis present

## 2012-01-15 DIAGNOSIS — R110 Nausea: Secondary | ICD-10-CM | POA: Diagnosis not present

## 2012-01-15 MED ORDER — FENTANYL CITRATE 0.05 MG/ML IJ SOLN
25.0000 ug | Freq: Once | INTRAMUSCULAR | Status: DC
Start: 2012-01-15 — End: 2012-01-31

## 2012-01-15 MED ORDER — FENTANYL CITRATE 0.05 MG/ML IJ SOLN
25.0000 ug | INTRAMUSCULAR | Status: DC | PRN
  Administered 2012-01-15: 50 ug via INTRAVENOUS
  Administered 2012-01-16: 25 ug via INTRAVENOUS
  Filled 2012-01-15 (×2): qty 2

## 2012-01-15 MED ORDER — METOCLOPRAMIDE HCL 5 MG/ML IJ SOLN
5.0000 mg | Freq: Three times a day (TID) | INTRAMUSCULAR | Status: DC
  Administered 2012-01-15 – 2012-01-24 (×20): 5 mg via INTRAVENOUS
  Filled 2012-01-15 (×32): qty 1

## 2012-01-15 MED ORDER — LORAZEPAM 0.5 MG PO TABS
0.5000 mg | ORAL_TABLET | Freq: Every day | ORAL | Status: DC
  Administered 2012-01-15 – 2012-01-30 (×16): 0.5 mg via ORAL
  Filled 2012-01-15 (×20): qty 1

## 2012-01-15 MED ORDER — SODIUM CHLORIDE 0.9 % IV SOLN
250.0000 mL | INTRAVENOUS | Status: DC
  Administered 2012-01-15: 1000 mL via INTRAVENOUS

## 2012-01-15 MED ORDER — SODIUM CHLORIDE 0.9 % IV SOLN
Freq: Once | INTRAVENOUS | Status: AC
  Administered 2012-01-15: 07:00:00 via INTRAVENOUS

## 2012-01-15 MED ORDER — FOOD THICKENER (THICKENUP CLEAR)
ORAL | Status: DC | PRN
  Filled 2012-01-15 (×2): qty 120

## 2012-01-15 MED ORDER — IOHEXOL 300 MG/ML  SOLN
50.0000 mL | Freq: Once | INTRAMUSCULAR | Status: AC | PRN
  Administered 2012-01-15: 15 mL via INTRAVENOUS

## 2012-01-15 MED ORDER — PANTOPRAZOLE SODIUM 40 MG IV SOLR
40.0000 mg | Freq: Every day | INTRAVENOUS | Status: DC
  Administered 2012-01-15 – 2012-01-24 (×10): 40 mg via INTRAVENOUS
  Filled 2012-01-15 (×12): qty 40

## 2012-01-15 MED ORDER — ENOXAPARIN SODIUM 40 MG/0.4ML ~~LOC~~ SOLN
40.0000 mg | Freq: Every day | SUBCUTANEOUS | Status: DC
  Administered 2012-01-15 – 2012-01-23 (×9): 40 mg via SUBCUTANEOUS
  Filled 2012-01-15 (×11): qty 0.4

## 2012-01-15 MED ORDER — ONDANSETRON HCL 4 MG/2ML IJ SOLN
4.0000 mg | Freq: Four times a day (QID) | INTRAMUSCULAR | Status: DC | PRN
  Administered 2012-01-19 – 2012-01-30 (×22): 4 mg via INTRAVENOUS
  Filled 2012-01-15 (×24): qty 2

## 2012-01-15 MED ORDER — FENTANYL CITRATE 0.05 MG/ML IJ SOLN
25.0000 ug | Freq: Once | INTRAMUSCULAR | Status: AC
  Administered 2012-01-15: 25 ug via INTRAMUSCULAR
  Filled 2012-01-15: qty 2

## 2012-01-15 MED ORDER — PROMETHAZINE HCL 25 MG/ML IJ SOLN: 12.5000 mg | Freq: Four times a day (QID) | INTRAMUSCULAR | Status: DC | PRN

## 2012-01-15 MED ORDER — CYCLOSPORINE 0.05 % OP EMUL
1.0000 [drp] | Freq: Two times a day (BID) | OPHTHALMIC | Status: DC
  Administered 2012-01-15 – 2012-01-27 (×16): 1 [drp] via OPHTHALMIC
  Administered 2012-01-29 – 2012-01-30 (×2): via OPHTHALMIC
  Administered 2012-01-31: 1 [drp] via OPHTHALMIC
  Filled 2012-01-15 (×37): qty 1

## 2012-01-15 NOTE — H&P (Addendum)
[address]            [place] [address]          [phone]     [name] is an 78 y.o. female.  @bday  BJY:[id number] Chief Complaint: Was admitted to the emergency department following accidental removal of jejunostomy tube in the nursing facility. HPI: [name] is a 78 year old female who is status post esophagogastrectomy. She has been a nursing facility and the jejunostomy tube was accidentally dislodged. She was felt to require admission for replacement. She feels fairly well. She does have somewhat chronic loose stools she relates to tube feedings. She also has a chronic cough.   Past Medical History  Diagnosis Date  . Pernicious anemia   . Osteoarthritis   . Fibromyalgia   . Skin lesion     chronic calcific cutaneous lesions  . HX of multiple bleeding ulcers 09/01/2011  . Hypertension     takes Atenolol daily  . Emphysema   . Shortness of breath     with exertion  . Bronchitis     hx  . Adenocarcinoma of gastroesophageal junction   . Dizziness     d/t crystals in ears that "roll around"and can't get them out  . Back pain   . Osteoporosis   . Scoliosis   . Psoriasis   . H/O hiatal hernia   . GERD (gastroesophageal reflux disease)     takes Aciphex daily  . Gastric ulcer   . Constipation     since Chemo and Radiation;finished up in Nov 2012  . Diverticulosis   . History of colonic polyps   . Shingles     broke out 2wks ago;has been on meds and to finish up tomorrow  . Kidney stone 25+yrs ago  . Pernicious anemia   . Vitamin B deficiency     Past Surgical History  Procedure Date  . Right hip replacement 2001  . Tonsillectomy     as a child  . Tubal ligation 1975  . Appendectomy 1975  . Cataract surgery 10+yrs ago  . Esophagogastroduodenoscopy   . Colonoscopy   . Extubation 12/20/2011       . Partial esophagectomy 12/20/2011    Procedure: ESOPHAGECTOMY PARTIAL;  Surgeon: Norton Blizzard, MD;  Location: [facility]  OR;  Service: Thoracic;  Laterality: N/A;    Family History  Problem Relation Age of Onset  . Cancer Maternal Grandmother     gynecologic  . Cancer Maternal Aunt     gynecologic  . Breast cancer Paternal Aunt   . Breast cancer Paternal Aunt   . Breast cancer Paternal Aunt   . Anesthesia problems Neg Hx   . Hypotension Neg Hx   . Malignant hyperthermia Neg Hx   . Pseudochol deficiency Neg Hx    Social History:  reports that she has quit smoking. She has never used smokeless tobacco. She reports that she does not drink alcohol or use illicit drugs.  Allergies:  Allergies  Allergen Reactions  . Adhesive (Tape) Itching  . Biaxin Itching  . Darvon Itching  . Epinephrine Itching  . Morphine And Related Itching  . Oxycodone Itching  . Oxycontin Itching  . Sulfa Antibiotics Itching  . Vicodin (Hydrocodone-Acetaminophen) Itching    Medications Prior to Admission  Medication Dose Route Frequency Provider Last Rate Last Dose  . 0.9 %  sodium chloride  infusion   Intravenous Once Kathlee Nations Trigt III, MD 70 mL/hr at 01/15/12 [phone]    . cycloSPORINE (RESTASIS) 0.05 % ophthalmic emulsion 1 drop  1 drop Both Eyes BID Kathlee Nations Trigt III, MD      . enoxaparin (LOVENOX) injection 40 mg  40 mg Subcutaneous QHS Kathlee Nations Trigt III, MD      . fentaNYL (SUBLIMAZE) injection 25 mcg  25 mcg Intramuscular Once April K Palumbo-Rasch, MD   25 mcg at 01/15/12 0140  . fentaNYL (SUBLIMAZE) injection 25 mcg  25 mcg Intramuscular Once Kathlee Nations Trigt III, MD      . metoCLOPramide (REGLAN) injection 5 mg  5 mg Intravenous Q8H Kathlee Nations Trigt III, MD      . pantoprazole (PROTONIX) injection 40 mg  40 mg Intravenous QHS Kathlee Nations Trigt III, MD       Medications Prior to Admission  Medication Sig Dispense Refill  . cycloSPORINE (RESTASIS) 0.05 % ophthalmic emulsion Place 1 drop into both eyes 2 (two) times daily.        . Nutritional Supplements (FEEDING SUPPLEMENT, PIVOT 1.5 CAL,) LIQD Place 1,000 mLs into  feeding tube daily.  1000 mL  2    No results found for this or any previous visit (from the past 48 hour(s)). Dg Abd 1 View  01/15/2012  *RADIOLOGY REPORT*  Clinical Data: Injection of peg tube.  The patient has had esophagectomy, partial gastrectomy and gastric pull-through by history.  The feeding tube was displaced and replaced in the emergency room.  Assess tube location.  ABDOMEN - 1 VIEW  Comparison: CT of the abdomen and pelvis 08/04/2011  Findings: Contrast has been injected.  Contrast is entirely intraluminal and fills a small bowel loop in the left mid abdomen. No evidence for bowel obstruction, extravasation of contrast. Surgical clips are identified in the left upper quadrant. Dystrophic cyst, soft tissue calcifications overlie the pelvis. Patient has had prior right hip arthroplasty.  There is scoliosis associated degenerative change within the thoracolumbar spine.  IMPRESSION: Feeding tube is intraluminal, within the left mid abdominal small bowel loop.  I discussed the findings with Dr. Terressa Koyanagi.  Original Report Authenticated By: Patterson Hammersmith, M.D.   Review of Systems - Negative except as stated above   Blood pressure 143/64, pulse 113, temperature 99.3 F (37.4 C), temperature source Oral, resp. rate 16, height 5\' 4"  (1.626 m), weight 150 lb (68.04 kg), SpO2 98.00%. Physical Examination: General appearance - alert, well appearing, and in no distress Chest - clear to auscultation, no wheezes, rales or rhonchi, symmetric air entry Heart - S1 and S2 normal, RRR Abdomen - soft, nontender, nondistended, no masses or organomegaly. J tube dislodged Rectal - deferred Back exam - nontender Musculoskeletal - no joint tenderness, deformity or swelling, no muscular tenderness noted Extremities - peripheral pulses normal, no pedal edema, no clubbing or cyanosis Skin - normal coloration and turgor, no rashes, no suspicious skin lesions noted Neuro- grossly  nonfocal Assessment/Plan Dislodged j tube for re-insertion  [name] 01/15/2012, 1:34 PM    patient examined and medical record reviewed,agree with above note. [name] 01/15/2012

## 2012-01-15 NOTE — Procedures (Signed)
Procedure:  Jejunostomy tube replacement Findings:  Tract catheterized with 5 Fr cath.  16 Fr red Ferrando tube advanced over wire.  Tip well into jejunum.  OK to use.

## 2012-01-15 NOTE — ED Notes (Signed)
Patient transported to X-ray 

## 2012-01-15 NOTE — ED Notes (Signed)
Pt IV site wrapped per patient request

## 2012-01-15 NOTE — ED Notes (Signed)
DR. Zenaida Niece TRIGHT AT BEDSIDE EVALUATING PT.

## 2012-01-15 NOTE — ED Notes (Addendum)
PT. WAITING FOR ADMITTING MD.  

## 2012-01-15 NOTE — ED Notes (Signed)
PT. HAS NO IV AT ARRIVAL , REMOVED DOCUMENTED -EXISTING IV AT CHART.

## 2012-01-15 NOTE — ED Notes (Signed)
MD aware of heart rate. 

## 2012-01-15 NOTE — ED Provider Notes (Signed)
History     CSN: [id number]  Arrival date & time 01/15/12  0033   First MD Initiated Contact with Patient 01/15/12 0047      Chief Complaint  Patient presents with  . PEG tube fell out     (Consider location/radiation/quality/duration/timing/severity/associated sxs/prior treatment) The history is provided by the patient. No language interpreter was used.  Patient was coughing this evening and had diarrhea and had to run to the bathroom while hooked up to feeds and pulled out her feeding tube while in transit.  Unable to be replaced at facility.  Place by Dr. Edwyna Shell during her esophagectomy for Ca.  Denies.  CP SOB n/v.  No rashes no f/c/r.   Past Medical History  Diagnosis Date  . Pernicious anemia   . Osteoarthritis   . Fibromyalgia   . Skin lesion     chronic calcific cutaneous lesions  . HX of multiple bleeding ulcers 09/01/2011  . Hypertension     takes Atenolol daily  . Emphysema   . Shortness of breath     with exertion  . Bronchitis     hx  . Adenocarcinoma of gastroesophageal junction   . Dizziness     d/t crystals in ears that "roll around"and can't get them out  . Back pain   . Osteoporosis   . Scoliosis   . Psoriasis   . H/O hiatal hernia   . GERD (gastroesophageal reflux disease)     takes Aciphex daily  . Gastric ulcer   . Constipation     since Chemo and Radiation;finished up in Nov 2012  . Diverticulosis   . History of colonic polyps   . Shingles     broke out 2wks ago;has been on meds and to finish up tomorrow  . Kidney stone 25+yrs ago  . Pernicious anemia   . Vitamin B deficiency     Past Surgical History  Procedure Date  . Right hip replacement 2001  . Tonsillectomy     as a child  . Tubal ligation 1975  . Appendectomy 1975  . Cataract surgery 10+yrs ago  . Esophagogastroduodenoscopy   . Colonoscopy   . Extubation 12/20/2011       . Partial esophagectomy 12/20/2011    Procedure: ESOPHAGECTOMY PARTIAL;  Surgeon: Norton Blizzard, MD;   Location: [facility] OR;  Service: Thoracic;  Laterality: N/A;    Family History  Problem Relation Age of Onset  . Cancer Maternal Grandmother     gynecologic  . Cancer Maternal Aunt     gynecologic  . Breast cancer Paternal Aunt   . Breast cancer Paternal Aunt   . Breast cancer Paternal Aunt   . Anesthesia problems Neg Hx   . Hypotension Neg Hx   . Malignant hyperthermia Neg Hx   . Pseudochol deficiency Neg Hx     History  Substance Use Topics  . Smoking status: Former Games developer  . Smokeless tobacco: Never Used   Comment: quit 53yrs ago  . Alcohol Use: No    OB History    Grav Para Term Preterm Abortions TAB SAB Ect Mult Living                  Review of Systems  Constitutional: Negative.   HENT: Negative.   Eyes: Negative.   Respiratory: Negative.  Negative for shortness of breath.   Cardiovascular: Negative for chest pain.  Gastrointestinal: Negative for abdominal distention.  Genitourinary: Negative for difficulty urinating.  Musculoskeletal: Positive for arthralgias.  Neurological: Negative.   Hematological: Negative.   Psychiatric/Behavioral: Negative.     Allergies  Adhesive; Biaxin; Darvon; Epinephrine; Morphine and related; Oxycodone; Oxycontin; Sulfa antibiotics; and Vicodin  Home Medications   Current Outpatient Rx  Name Route Sig Dispense Refill  . CYCLOSPORINE 0.05 % OP EMUL Both Eyes Place 1 drop into both eyes 2 (two) times daily.      Marland Kitchen DIPHENHYDRAMINE HCL 12.5 MG/5ML PO ELIX Per Tube Place 25 mg into feeding tube every 6 (six) hours as needed. For itching or sleep    . LORAZEPAM 2 MG/ML PO CONC Per Tube Place 0.3 mg into feeding tube every 6 (six) hours as needed. For for anxiety    . METOCLOPRAMIDE HCL 5 MG/5ML PO SOLN Per Tube Place 10 mg into feeding tube 2 (two) times daily.    Marland Kitchen PIVOT 1.5 CAL PO LIQD Per Tube Place 1,000 mLs into feeding tube continuous. Continous 5pm-7am    . PRESCRIPTION MEDICATION Per Tube Place 75 mg into feeding tube 2 (two)  times daily. Metoprolol Tartrate 25mg /32ml Syrup    . PRESCRIPTION MEDICATION Per Tube Place 15 mLs into feeding tube every 6 (six) hours as needed. Hydrocodone 7.5/500mg  Liquid. For pain    . TIZANIDINE HCL 4 MG PO TABS Per Tube Place 2 mg into feeding tube 2 (two) times daily.     Marland Kitchen PIVOT 1.5 CAL PO LIQD Per Tube Place 1,000 mLs into feeding tube daily. 1000 mL 2    BP 147/71  Pulse 120  Temp(Src) 98.9 F (37.2 C) (Oral)  Resp 18  Ht 5\' 4"  (1.626 m)  Wt 150 lb (68.04 kg)  BMI 25.75 kg/m2  SpO2 94%  Physical Exam  Constitutional: She is oriented to person, place, and time. She appears well-developed and well-nourished.  HENT:  Head: Normocephalic.  Mouth/Throat: Oropharynx is clear and moist.  Eyes: Conjunctivae are normal. Pupils are equal, round, and reactive to light.  Neck: Normal range of motion. Neck supple.  Cardiovascular: Normal rate and regular rhythm.   Pulmonary/Chest: Effort normal and breath sounds normal. She has no wheezes. She has no rales.  Abdominal: Soft. Bowel sounds are normal. There is no guarding.       Site of PEG with mild erythema around site  Musculoskeletal: Normal range of motion.  Neurological: She is alert and oriented to person, place, and time.  Skin: Skin is warm and dry.  Psychiatric: She has a normal mood and affect.    ED Course  FEEDING TUBE REPLACEMENT Date/Time: 01/15/2012 2:25 AM Performed by: Jasmine Awe Authorized by: Jasmine Awe Consent: Verbal consent obtained. Consent given by: patient Patient identity confirmed: arm band Preparation: Patient was prepped and draped in the usual sterile fashion. Indications: tube dislodged Local anesthesia used: no Patient sedated: no Tube type: gastrostomy Patient position: supine Procedure type: replacement Tube size: 14 Fr Bulb inflation fluid: normal saline Placement/position confirmation: x-ray Tube placement difficulty: minimal Patient tolerance: Patient  tolerated the procedure well with no immediate complications.   (including critical care time)  Labs Reviewed - No data to display Dg Abd 1 View  01/15/2012  *RADIOLOGY REPORT*  Clinical Data: Injection of peg tube.  The patient has had esophagectomy, partial gastrectomy and gastric pull-through by history.  The feeding tube was displaced and replaced in the emergency room.  Assess tube location.  ABDOMEN - 1 VIEW  Comparison: CT of the abdomen and pelvis 08/04/2011  Findings: Contrast has been injected.  Contrast is entirely  intraluminal and fills a small bowel loop in the left mid abdomen. No evidence for bowel obstruction, extravasation of contrast. Surgical clips are identified in the left upper quadrant. Dystrophic cyst, soft tissue calcifications overlie the pelvis. Patient has had prior right hip arthroplasty.  There is scoliosis associated degenerative change within the thoracolumbar spine.  IMPRESSION: Feeding tube is intraluminal, within the left mid abdominal small bowel loop.  I discussed the findings with Dr. Terressa Koyanagi.  Original Report Authenticated By: Patterson Hammersmith, M.D.     No diagnosis found.    MDM  To be seen by Dr. Morton Peters following phone conversation        Everly Rubalcava K Rylee Nuzum-Rasch, MD 01/15/12 [phone]

## 2012-01-15 NOTE — H&P (Signed)
patient examined and medical record reviewed,agree with above note. VAN TRIGT III,PETER 01/15/2012    

## 2012-01-15 NOTE — Progress Notes (Signed)
ED MSW NOTE:  Pt Ms. Fesler presented to [facility] ED via EMS from [facility] Side # [phone] 2/2 inability to re-insert Peg Tube. Per ED, pt will be admitted for IR to replace Peg Tube. MSW placed call to above noted SNF and spoke to [facility] in Admissions who confirmed pt is able to return when clinically ready.  MSW placed call to pts son/ Lillia Dallas @ [phone] who confirmed he is aware of the admit and is expected to arrive this afternoon. Mr. Tilden Dome stated he would prefer his mother to discharge home rather then back to the SNF stating he personally called [facility] and requested their assistance. MSW informed Mr. Tilden Dome further evaluation via the Medical Team  and AX via the CM/MSW and pt will need to transpire before final decisions can be made. Mr. Tilden Dome verbalized understanding and  expressed he is willing and able to be taught how to care for the Peg Tube as well an any other medical needs to keep his mother at home.  ED MSW updated inpt CSW and CM re: the potential interventions for safe guarding disposition.  Dionne Milo MSW [facility] Emergency Dept. Weekend/Social Worker [phone]

## 2012-01-15 NOTE — ED Notes (Signed)
Per EMS, the patient comes to [place] from a local SNF.  The patient alerted the SNF staff at 2230 yesterday that her PEG tube had become dislodged.  Patient denied any pain at that time.

## 2012-01-15 NOTE — Progress Notes (Signed)
78 y/o WF s/p transhiatal esopha gectomy for Ca dependent on HS tube feeds due to aspirationn presents from [facility] after jejunostomy tube fell out. Attempts at replacement not successful.   Will admit,hydrate  with. / IV fluid and get IR consult for new tube

## 2012-01-16 LAB — COMPREHENSIVE METABOLIC PANEL
ALT: 6 U/L (ref 0–35)
AST: 16 U/L (ref 0–37)
Albumin: 3.2 g/dL — ABNORMAL LOW (ref 3.5–5.2)
Alkaline Phosphatase: 87 U/L (ref 39–117)
BUN: 10 mg/dL (ref 6–23)
CO2: 24 mEq/L (ref 19–32)
Calcium: 11.7 mg/dL — ABNORMAL HIGH (ref 8.4–10.5)
Chloride: 102 mEq/L (ref 96–112)
Creatinine, Ser: 0.73 mg/dL (ref 0.50–1.10)
GFR calc Af Amer: >90 mL/min (ref >90)
GFR calc non Af Amer: 82 mL/min — ABNORMAL LOW (ref >90)
Glucose, Bld: 150 mg/dL — ABNORMAL HIGH (ref 70–99)
Potassium: 3.6 mEq/L (ref 3.5–5.1)
Sodium: 138 mEq/L (ref 135–145)
Total Bilirubin: 0.3 mg/dL (ref 0.3–1.2)
Total Protein: 7.6 g/dL (ref 6.0–8.3)

## 2012-01-16 LAB — CBC
HCT: 30.8 % — ABNORMAL LOW (ref 36.0–46.0)
Hemoglobin: 9.4 g/dL — ABNORMAL LOW (ref 12.0–15.0)
MCH: 27.3 pg (ref 26.0–34.0)
MCHC: 30.5 g/dL (ref 30.0–36.0)
MCV: 89.5 fL (ref 78.0–100.0)
Platelets: 394 10*3/uL (ref 150–400)
RBC: 3.44 MIL/uL — ABNORMAL LOW (ref 3.87–5.11)
RDW: 16 % — ABNORMAL HIGH (ref 11.5–15.5)
WBC: 8.4 10*3/uL (ref 4.0–10.5)

## 2012-01-16 MED ORDER — PRESCRIPTION MEDICATION: 75.0000 mg | Freq: Two times a day (BID) | Status: DC

## 2012-01-16 MED ORDER — TIZANIDINE HCL 2 MG PO TABS
2.0000 mg | ORAL_TABLET | Freq: Two times a day (BID) | ORAL | Status: DC
  Administered 2012-01-16 – 2012-01-31 (×30): 2 mg
  Filled 2012-01-16 (×34): qty 1

## 2012-01-16 MED ORDER — LORAZEPAM 2 MG/ML PO CONC
0.3000 mg | Freq: Four times a day (QID) | ORAL | Status: DC | PRN
  Filled 2012-01-16: qty 0.15

## 2012-01-16 MED ORDER — PIVOT 1.5 CAL PO LIQD
1000.0000 mL | ORAL | Status: DC
  Administered 2012-01-16: 1000 mL
  Filled 2012-01-16 (×3): qty 1000

## 2012-01-16 MED ORDER — METOPROLOL TARTRATE 25 MG/10 ML ORAL SUSPENSION
75.0000 mg | Freq: Two times a day (BID) | ORAL | Status: DC
  Administered 2012-01-16 – 2012-01-18 (×5): 75 mg via ORAL
  Filled 2012-01-16 (×6): qty 30

## 2012-01-16 MED ORDER — OXYCODONE-ACETAMINOPHEN 5-325 MG/5ML PO SOLN
5.0000 mL | ORAL | Status: DC | PRN
  Administered 2012-01-17 – 2012-01-29 (×14): 5 mL
  Filled 2012-01-16 (×18): qty 5

## 2012-01-16 MED ORDER — METOCLOPRAMIDE HCL 5 MG/5ML PO SOLN
10.0000 mg | Freq: Two times a day (BID) | ORAL | Status: DC
  Administered 2012-01-16 – 2012-01-19 (×8): 10 mg
  Filled 2012-01-16 (×10): qty 10

## 2012-01-16 MED ORDER — DIPHENHYDRAMINE HCL 12.5 MG/5ML PO ELIX
25.0000 mg | ORAL_SOLUTION | Freq: Four times a day (QID) | ORAL | Status: DC | PRN
  Administered 2012-01-17 – 2012-01-25 (×9): 25 mg
  Filled 2012-01-16 (×9): qty 10

## 2012-01-16 NOTE — Progress Notes (Signed)
Physical Therapy Evaluation Patient Details Name: [name] MRN: [id number] DOB: Nov 05, 1934 Today's Date: 01/16/2012  Problem List:  Patient Active Problem List  Diagnoses  . Pernicious anemia  . Fibromyalgia  . Arthritis  . Skin lesion  . Transfusion history  . Adenocarcinoma of gastroesophageal junction  . HX of multiple bleeding ulcers  . Preop cardiovascular exam  . Hypertension    Past Medical History:  Past Medical History  Diagnosis Date  . Pernicious anemia   . Osteoarthritis   . Fibromyalgia   . Skin lesion     chronic calcific cutaneous lesions  . HX of multiple bleeding ulcers 09/01/2011  . Hypertension     takes Atenolol daily  . Emphysema   . Shortness of breath     with exertion  . Bronchitis     hx  . Adenocarcinoma of gastroesophageal junction   . Dizziness     d/t crystals in ears that "roll around"and can't get them out  . Back pain   . Osteoporosis   . Scoliosis   . Psoriasis   . H/O hiatal hernia   . GERD (gastroesophageal reflux disease)     takes Aciphex daily  . Gastric ulcer   . Constipation     since Chemo and Radiation;finished up in Nov 2012  . Diverticulosis   . History of colonic polyps   . Shingles     broke out 2wks ago;has been on meds and to finish up tomorrow  . Kidney stone 25+yrs ago  . Pernicious anemia   . Vitamin B deficiency    Past Surgical History:  Past Surgical History  Procedure Date  . Right hip replacement 2001  . Tonsillectomy     as a child  . Tubal ligation 1975  . Appendectomy 1975  . Cataract surgery 10+yrs ago  . Esophagogastroduodenoscopy   . Colonoscopy   . Extubation 12/20/2011       . Partial esophagectomy 12/20/2011    Procedure: ESOPHAGECTOMY PARTIAL;  Surgeon: Norton Blizzard, MD;  Location: [facility] OR;  Service: Thoracic;  Laterality: N/A;    PT Assessment/Plan/Recommendation PT Assessment Clinical Impression Statement:  78 yo female admitted to replace J-tube which was dislodged  at SNF;  Overall moving well -- recommend dc back to SNF as home and bathroom are prepared for pt's return home;  If patient and family insist upon home, this is not out of the question; Would request HHPT follow-up, and possibly HHRN for J-tube teaching PT Recommendation/Assessment: Patient will need skilled PT in the acute care venue PT Problem List: Decreased activity tolerance;Decreased balance;Decreased mobility;Decreased cognition;Decreased knowledge of use of DME;Decreased safety awareness;Pain Barriers to Discharge: Inaccessible home environment PT Therapy Diagnosis : Difficulty walking;Abnormality of gait;Generalized weakness;Acute pain PT Plan PT Frequency: Min 3X/week PT Treatment/Interventions: DME instruction;Gait training;Functional mobility training;Therapeutic activities;Therapeutic exercise;Patient/family education PT Recommendation Recommendations for Other Services: Speech consult (Pt reports problems swallowing) Follow Up Recommendations: Skilled nursing facility;Home health PT;Supervision/Assistance - 24 hour Equipment Recommended: Rolling walker with 5" wheels;3 in 1 bedside comode (if for dc to home) PT Goals  Acute Rehab PT Goals PT Goal Formulation: With patient Time For Goal Achievement: 2 weeks Pt will Roll Supine to Left Side: with supervision PT Goal: Rolling Supine to Left Side - Progress: Goal set today Pt will go Supine/Side to Sit: with supervision PT Goal: Supine/Side to Sit - Progress: Goal set today Pt will go Sit to Supine/Side: with supervision PT Goal: Sit to Supine/Side - Progress: Goal  set today Pt will go Sit to Stand: with supervision PT Goal: Sit to Stand - Progress: Goal set today Pt will go Stand to Sit: with supervision PT Goal: Stand to Sit - Progress: Goal set today Pt will Ambulate: >150 feet;with supervision;with least restrictive assistive device PT Goal: Ambulate - Progress: Goal set today  PT Evaluation Precautions/Restrictions    Precautions Precautions: Fall Prior Functioning  Home Living Lives With: Sheran Spine Help From: Family Type of Home: House Home Layout: Two level;Able to live on main level with bedroom/bathroom Home Access: Level entry Bathroom Shower/Tub: Health visitor: Standard Home Adaptive Equipment: Crutches;Wheelchair - manual;Walker - rolling Additional Comments: Currently remodeling bathroom Prior Function Level of Independence: Independent with basic ADLs;Independent with gait;Independent with transfers;Needs assistance with homemaking Comments: From SNF, pt is willing to go back to [facility] while remodel of bathroom is completed Cognition Cognition Arousal/Alertness: Awake/alert Overall Cognitive Status: Impaired Orientation Level: Oriented X4 Safety/Judgement: Decreased safety judgement for tasks assessed Decreased Safety/Judgement: Decreased awareness of need for assistance Cognition - Other Comments: Pt with flat affect and minimal verbalizations although repeatedly asked questions very selective in choosing to answer Sensation/Coordination Sensation Light Touch: Appears Intact Coordination Gross Motor Movements are Fluid and Coordinated: Yes Fine Motor Movements are Fluid and Coordinated: Not tested Extremity Assessment RUE Assessment RUE Assessment: Within Functional Limits LUE Assessment LUE Assessment: Within Functional Limits RLE Assessment RLE Assessment: Within Functional Limits LLE Assessment LLE Assessment: Within Functional Limits Mobility (including Balance) Bed Mobility Bed Mobility: No Transfers Sit to Stand: 4: Min assist;With armrests;From chair/3-in-1 Sit to Stand Details (indicate cue type and reason): cues for safety Stand to Sit: 4: Min assist;To chair/3-in-1;With upper extremity assist (without physical contact) Stand to Sit Details: cues to control descent Ambulation/Gait Ambulation/Gait: Yes Ambulation/Gait Assistance: 4: Min  assist Ambulation/Gait Assistance Details (indicate cue type and reason): cues for posture, and for RW proximity Ambulation Distance (Feet): 30 Feet (pt declined further amb 2/2 fatigue) Assistive device: Rolling walker Gait Pattern: Decreased stride length;Shuffle  Posture/Postural Control Posture/Postural Control: No significant limitations    End of Session PT - End of Session Activity Tolerance: Patient limited by fatigue Patient left: in chair;with call bell in reach;with family/visitor present Nurse Communication: Mobility status for transfers;Mobility status for ambulation General Behavior During Session: [facility] for tasks performed Cognition: [facility] for tasks performed  Van Clines [facility] [place], [place] [phone]  01/16/2012, 6:18 PM

## 2012-01-16 NOTE — Progress Notes (Signed)
The patient's feeding jejunostomy was replaced by interventional radiology. Correct placement has been confirmed by a tube study. The plan will be to resume tube feedings into the jejunum and then return the patient to her skilled nursing facility. She will keep her appointment in the office to see Dr. Fransisco Hertz.

## 2012-01-16 NOTE — Progress Notes (Signed)
[address]            [place] [address]          [phone]          Subjective: Feels ok, no current nausea  Objective  Telemetry sinus tachy  Temp:  [98.2 F (36.8 C)-98.9 F (37.2 C)] 98.4 F (36.9 C) (02/10 0543) Pulse Rate:  [118-130] 118  (02/10 0543) Resp:  [18] 18  (02/10 0543) BP: (130-148)/(70-73) 130/73 mmHg (02/10 0543) SpO2:  [93 %-94 %] 93 % (02/10 0543)   Intake/Output Summary (Last 24 hours) at 01/16/12 1140 Last data filed at 01/16/12 0700  Gross per 24 hour  Intake    540 ml  Output    850 ml  Net   -310 ml       General appearance: alert and no distress Heart: regular rate and rhythm Lungs: clear to auscultation bilaterally Abdomen: soft, non-tender; bowel sounds normal; no masses,  no organomegaly  Lab Results:  [facility] 01/16/12 0622  NA 138  K 3.6  CL 102  CO2 24  GLUCOSE 150*  BUN 10  CREATININE 0.73  CALCIUM 11.7*  MG --  PHOS --    Basename 01/16/12 0622  AST 16  ALT 6  ALKPHOS 87  BILITOT 0.3  PROT 7.6  ALBUMIN 3.2*   No results found for this basename: LIPASE:2,AMYLASE:2 in the last 72 hours  Basename 01/16/12 0622  WBC 8.4  NEUTROABS --  HGB 9.4*  HCT 30.8*  MCV 89.5  PLT 394   No results found for this basename: CKTOTAL:4,CKMB:4,TROPONINI:4 in the last 72 hours No components found with this basename: POCBNP:3 No results found for this basename: DDIMER in the last 72 hours No results found for this basename: HGBA1C in the last 72 hours No results found for this basename: CHOL,HDL,LDLCALC,TRIG,CHOLHDL in the last 72 hours No results found for this basename: TSH,T4TOTAL,FREET3,T3FREE,THYROIDAB in the last 72 hours No results found for this basename: VITAMINB12,FOLATE,FERRITIN,TIBC,IRON,RETICCTPCT in the last 72 hours  Medications: Scheduled    . cycloSPORINE  1 drop Both Eyes BID  . enoxaparin  40 mg Subcutaneous QHS  . fentaNYL  25 mcg Intramuscular Once  .  LORazepam  0.5 mg Oral QHS  . metoCLOPramide (REGLAN) injection  5 mg Intravenous Q8H  . pantoprazole (PROTONIX) IV  40 mg Intravenous QHS     Radiology/Studies:  Dg Abd 1 View  01/15/2012  *RADIOLOGY REPORT*  Clinical Data: Injection of peg tube.  The patient has had esophagectomy, partial gastrectomy and gastric pull-through by history.  The feeding tube was displaced and replaced in the emergency room.  Assess tube location.  ABDOMEN - 1 VIEW  Comparison: CT of the abdomen and pelvis 08/04/2011  Findings: Contrast has been injected.  Contrast is entirely intraluminal and fills a small bowel loop in the left mid abdomen. No evidence for bowel obstruction, extravasation of contrast. Surgical clips are identified in the left upper quadrant. Dystrophic cyst, soft tissue calcifications overlie the pelvis. Patient has had prior right hip arthroplasty.  There is scoliosis associated degenerative change within the thoracolumbar spine.  IMPRESSION: Feeding tube is intraluminal, within the left mid abdominal small bowel loop.  I discussed the findings with Dr. Terressa Koyanagi.  Original Report Authenticated By: Patterson Hammersmith, M.D.   Ir Gj Tube Change  01/15/2012  *RADIOLOGY REPORT*  Clinical Data:  Recent  esophagectomy for esophageal carcinoma.  An indwelling jejunostomy catheter has pulled out.  JEJUNOSTOMY FEEDING TUBE EXCHANGE  Contrast:  15 ml Omnipaque-300  Fluoroscopy Time: 7.1 minutes.  Procedure:  The procedure, risks, benefits, and alternatives were explained to the patient.  Questions regarding the procedure were encouraged and answered.  The patient understands and consents to the procedure.  The left abdominal wall was prepped with Betadine in a sterile fashion, and a sterile drape was applied covering the operative field.  A sterile gown and sterile gloves were used for the procedure.  A 5-French catheter was advanced into the jejunostomy exit site. Contrast injection was performed.  Eventual  advancement of a hydrophilic guide wire was performed into the jejunum.  The catheter was further advanced over a wire.  A 16-French red Oleary tube was placed.  The end of the tube was cut off in order to allow over the wire placement.  The catheter was advanced over a wire into the jejunum.  Final catheter position was confirmed with a fluoroscopic spot image obtained after injection of contrast.  Complications:  None  Findings:  Percutaneous access to the jejunum was able to be catheterized allowing jejunostomy tube replacement.  After advancement over a wire, the tube extends well into the jejunum. This tube may be used immediately.  IMPRESSION: Replacement of jejunostomy tube via preexisting tract.  A new 16- French red Dorame tube was advanced into the jejunum.  Original Report Authenticated By: Reola Calkins, M.D.    Inr Will add last result for INR, ABG once components are confirmed Will add last 4 CBG results once components are confirmed  Assessment/Plan: Stable, restart meds and feedings.  Nutrition consult as she wants different tube feed if possible due too smell and diarrhea Poss back to snf tomorrow   LOS: 1 day    [name] E 2/10/201311:40 AM

## 2012-01-17 LAB — WOUND CULTURE
Culture: NO GROWTH
Gram Stain: NONE SEEN

## 2012-01-17 LAB — GLUCOSE, CAPILLARY: Glucose-Capillary: 107 mg/dL — ABNORMAL HIGH (ref 70–99)

## 2012-01-17 LAB — CLOSTRIDIUM DIFFICILE BY PCR: Toxigenic C. Difficile by PCR: POSITIVE — AB

## 2012-01-17 MED ORDER — METRONIDAZOLE 500 MG PO TABS: 500.0000 mg | ORAL_TABLET | Freq: Three times a day (TID) | ORAL | Status: DC

## 2012-01-17 MED ORDER — SODIUM CHLORIDE 0.9 % IV SOLN
INTRAVENOUS | Status: DC
  Administered 2012-01-17 – 2012-01-23 (×8): via INTRAVENOUS
  Administered 2012-01-26: 20 mL/h via INTRAVENOUS

## 2012-01-17 MED ORDER — DIPHENOXYLATE-ATROPINE 2.5-0.025 MG/5ML PO LIQD: 2.5000 mL | Freq: Once | ORAL | Status: DC

## 2012-01-17 MED ORDER — ENSURE PUDDING PO PUDG
1.0000 | Freq: Three times a day (TID) | ORAL | Status: DC
  Administered 2012-01-17: 1 via ORAL

## 2012-01-17 MED ORDER — VANCOMYCIN 50 MG/ML ORAL SOLUTION
125.0000 mg | Freq: Four times a day (QID) | ORAL | Status: DC
  Administered 2012-01-17 – 2012-01-31 (×53): 125 mg via ORAL
  Filled 2012-01-17 (×60): qty 2.5

## 2012-01-17 MED ORDER — OXYCODONE-ACETAMINOPHEN 5-325 MG/5ML PO SOLN: 5.0000 mL | ORAL | Status: AC | PRN

## 2012-01-17 MED ORDER — LOPERAMIDE HCL 1 MG/5ML PO LIQD
2.0000 mg | Freq: Once | ORAL | Status: AC
  Administered 2012-01-17: 2 mg
  Filled 2012-01-17: qty 10

## 2012-01-17 MED ORDER — METRONIDAZOLE 50 MG/ML ORAL SUSPENSION
500.0000 mg | Freq: Three times a day (TID) | ORAL | Status: DC
  Administered 2012-01-17 (×2): 500 mg via JEJUNOSTOMY
  Filled 2012-01-17 (×3): qty 10

## 2012-01-17 NOTE — Progress Notes (Addendum)
INITIAL ADULT NUTRITION ASSESSMENT Date: 01/17/2012   Time: 10:26 AM Reason for Assessment: Consult, TF  ASSESSMENT: Female 78 y.o.  Dx: J-tube pulled out  Hx:  Past Medical History  Diagnosis Date  . Pernicious anemia   . Osteoarthritis   . Fibromyalgia   . Skin lesion     chronic calcific cutaneous lesions  . HX of multiple bleeding ulcers 09/01/2011  . Hypertension     takes Atenolol daily  . Emphysema   . Shortness of breath     with exertion  . Bronchitis     hx  . Adenocarcinoma of gastroesophageal junction   . Dizziness     d/t crystals in ears that "roll around"and can't get them out  . Back pain   . Osteoporosis   . Scoliosis   . Psoriasis   . H/O hiatal hernia   . GERD (gastroesophageal reflux disease)     takes Aciphex daily  . Gastric ulcer   . Constipation     since Chemo and Radiation;finished up in Nov 2012  . Diverticulosis   . History of colonic polyps   . Shingles     broke out 2wks ago;has been on meds and to finish up tomorrow  . Kidney stone 25+yrs ago  . Pernicious anemia   . Vitamin B deficiency     Related Meds:     . cycloSPORINE  1 drop Both Eyes BID  . enoxaparin  40 mg Subcutaneous QHS  . fentaNYL  25 mcg Intramuscular Once  . loperamide  2 mg Per Tube Once  . LORazepam  0.5 mg Oral QHS  . metoCLOPramide  10 mg Per Tube BID  . metoCLOPramide (REGLAN) injection  5 mg Intravenous Q8H  . metoprolol tartrate  75 mg Oral BID  . metroNIDAZOLE  500 mg Per J Tube TID  . pantoprazole (PROTONIX) IV  40 mg Intravenous QHS  . tiZANidine  2 mg Per Tube BID  . DISCONTD: diphenoxylate-atropine  2.5 mL Oral Once  . DISCONTD: feeding supplement (PIVOT 1.5 CAL)  1,000 mL Per Tube Q24H  . DISCONTD: metroNIDAZOLE  500 mg Per J Tube Q8H  . DISCONTD: PRESCRIPTION MEDICATION 75 mg  75 mg Per Tube BID     Ht: 5\' 4"  (162.6 cm)  Wt: 150 lb (68.04 kg)  Ideal Wt: 54.5 kg % Ideal Wt: 125%  Usual Wt: ~150 lbs % Usual Wt: 100%  Body mass index  is 25.75 kg/(m^2). Pt is overweight  Food/Nutrition Related Hx: patient recently had transhiatal esophagogastrectomy, with J-tube. Patient was previously on Pivot 1.5 at 50 ml/hr.   Labs:  CMP     Component Value Date/Time   NA 138 01/16/2012 0622   K 3.6 01/16/2012 0622   CL 102 01/16/2012 0622   CO2 24 01/16/2012 0622   GLUCOSE 150* 01/16/2012 0622   BUN 10 01/16/2012 0622   CREATININE 0.73 01/16/2012 0622   CALCIUM 11.7* 01/16/2012 0622   PROT 7.6 01/16/2012 0622   ALBUMIN 3.2* 01/16/2012 0622   AST 16 01/16/2012 0622   ALT 6 01/16/2012 0622   ALKPHOS 87 01/16/2012 0622   BILITOT 0.3 01/16/2012 0622   GFRNONAA 82* 01/16/2012 0622   GFRAA >90 01/16/2012 0622      Intake/Output Summary (Last 24 hours) at 01/17/12 1032 Last data filed at 01/17/12 0600  Gross per 24 hour  Intake    470 ml  Output   1506 ml  Net  -1036 ml  Diet Order: Dysphagia 2 with honey thick liquids  Supplements/Tube Feeding: none currently  IVF:    sodium chloride Last Rate: 70 mL/hr at 01/17/12 0248  DISCONTD: sodium chloride Last Rate: 250 mL (01/17/12 0248)    Estimated Nutritional Needs:   Kcal:1700-1900 Protein: 75-85 gm Fluid: 1.7-1.9 L  Patient is complaining that her TF is causing diarrhea and she doesn't like the smell of the formula. Would like to change. States that her diarrhea started when her J-tube was first placed and has not resolved since. RD recommends change to a formula with fiber to help with diarrhea. Per patients RN, EN is being held due to positive C Diff result this am. Per patient, po intake is only bites and sips, doesn't eat because she always feels full and nauseated. PO intake does not provide a significant amount of kcal or fluids.   NUTRITION DIAGNOSIS: -Inadequate enteral nutrition infusion (NI-2.3).  Status: Ongoing  RELATED TO: J-tube being pulled out  AS EVIDENCE BY: No EN currently running  MONITORING/EVALUATION(Goals): Goal: EN will meet >90% of estimated  nutrition needs, currently unmet.  Monitor: EN rate/formula, weight, labs, tolerance.   EDUCATION NEEDS: -No education needs identified at this time  INTERVENTION: 1. Recommend once EN is desired, start Jevity 1.2 and advance to a goal rate of 60 ml/hr. This will provide 1728 kcal, 80 gm protein and 1162 ml free water.  2. Additional free water will be needed to maintain hydration, recommend 200 ml water flushes every 6 hours to provide an additional 800 ml free water.  3. Add Ensure pudding TID while TF being held 3. RD will follow  Dietitian # [phone]  DOCUMENTATION CODES Per approved criteria  -Not Applicable    Clarene Duke MARIE 01/17/2012, 10:26 AM

## 2012-01-17 NOTE — Progress Notes (Signed)
UR Completed.  [name] [phone] 01/17/2012  

## 2012-01-17 NOTE — Progress Notes (Signed)
   CARE MANAGEMENT NOTE 01/17/2012  Patient:  ALENNA, RUSSELL   Account Number:  [id number]  Date Initiated:  01/17/2012  Documentation initiated by:  Avie Arenas  Subjective/Objective Assessment:   cdif - jej out - post esophageal ca.  From SNF - [facility].     Action/Plan:   PTA, PT RESIDES AT [facility]; STATES PLANS TO RETURN THERE AT DC.   Anticipated DC Date:  01/21/2012   Anticipated DC Plan:  SKILLED NURSING FACILITY  In-house referral  Clinical Social Worker      DC Planning Services  CM consult      Choice offered to / List presented to:             Status of service:  In process, will continue to follow Medicare Important Message given?   (If response is "NO", the following Medicare IM given date fields will be blank) Date Medicare IM given:   Date Additional Medicare IM given:    Discharge Disposition:    Per UR Regulation:  Reviewed for med. necessity/level of care/duration of stay  Comments:  01/17/12 Leondre Taul,RN,BSN 1400 REFERRAL TO CSW TO FACILITATE RETURN TO SNF AT DC. Phone #[phone]

## 2012-01-17 NOTE — Progress Notes (Signed)
Physical Therapy Treatment Patient Details Name: [name] MRN: [id number] DOB: Dec 02, 1934 Today's Date: 01/17/2012  PT Assessment/Plan  PT - Assessment/Plan Comments on Treatment Session: Pt able to ambulate and transfer well on RA. Pt declined further ambulation though. Pt encouraged to continue activity and ambulate to bathroom as able PT Plan: Discharge plan remains appropriate PT Goals  Acute Rehab PT Goals PT Goal: Sit to Stand - Progress: Met PT Goal: Stand to Sit - Progress: Met PT Goal: Ambulate - Progress: Progressing toward goal  PT Treatment Precautions/Restrictions  Precautions Precautions: Fall Restrictions Weight Bearing Restrictions: No Mobility (including Balance) Bed Mobility Bed Mobility: No Transfers Sit to Stand: 6: Modified independent (Device/Increase time);From bed Stand to Sit: 6: Modified independent (Device/Increase time);To chair/3-in-1;With armrests Ambulation/Gait Ambulation/Gait Assistance: 6: Modified independent (Device/Increase time) Ambulation/Gait Assistance Details (indicate cue type and reason): Pt declined hall ambulation secondary to diarrhea. Ambulation Distance (Feet): 60 Feet Assistive device: Rolling walker Gait Pattern: Within Functional Limits Stairs: No    Exercise  General Exercises - Lower Extremity Long Arc Quad: AROM;Other reps (comment);Seated;Both (25reps) Hip Flexion/Marching: AROM;Both;Seated;Other reps (comment) (25reps) End of Session PT - End of Session Activity Tolerance: Patient limited by fatigue Patient left: in chair;with call bell in reach General Behavior During Session: [facility] for tasks performed Cognition: [facility] for tasks performed  Delorse Lek 01/17/2012, 3:20 PM Toney Sang, PT [phone]

## 2012-01-17 NOTE — Progress Notes (Addendum)
   Subjective: Patient disappointed not being discharged today. Has occasional abd pain but sates it is no different than has had for some time. Denies nausea or emesis.  Objective: Vital signs in last 24 hours: Patient Vitals for the past 24 hrs:  BP Temp Temp src Pulse Resp SpO2  01/17/12 0245 136/75 mmHg 97 F (36.1 C) Oral 89  18  96 %  01/17/12 0100 108/76 mmHg 98.2 F (36.8 C) Oral 96  18  93 %  01/16/12 2117 116/74 mmHg 98.5 F (36.9 C) Oral 96  18  91 %  01/16/12 1530 108/69 mmHg 97.4 F (36.3 C) Other 105  18  93 %    Current Weight  01/15/12 150 lb (68.04 kg)    Hemodynamic parameters for last 24 hours:    Intake/Output from previous day: 02/10 0701 - 02/11 0700 In: 470 [P.O.:120; I.V.:350] Out: 1506 [Urine:700; Stool:806]   Physical Exam:  Cardiovascular: RRR, no murmurs, gallops, or rubs. Pulmonary: Clear to auscultation bilaterally; no rales, wheezes, or rhonchi. Abdomen: Soft, non tender, bowel sounds present. Extremities: No  lower extremity edema. Wounds: Clean and dry.  No erythema or signs of infection.  Lab Results: CBC: Basename 01/16/12 0622  WBC 8.4  HGB 9.4*  HCT 30.8*  PLT 394   BMET:  Basename 01/16/12 0622  NA 138  K 3.6  CL 102  CO2 24  GLUCOSE 150*  BUN 10  CREATININE 0.73  CALCIUM 11.7*    PT/INR: No results found for this basename: LABPROT,INR in the last 72 hours ABG:  INR: Will add last result for INR, ABG once components are confirmed Will add last 4 CBG results once components are confirmed  Assessment/Plan:  1.CDIF positive-Will start Flagyl. 2.Stable s/p feeding jejunostomy placement by IR. 3. As discussed with Dr. Donata Clay, will hold TF and continue maintenance IVF.   ZIMMERMAN,DONIELLE MPA-C 01/17/2012   patient examined and medical record reviewed,agree with above note. VAN TRIGT III,PETER 01/17/2012

## 2012-01-17 NOTE — Progress Notes (Signed)
CSW received referral for pt from [facility] . CSW completed psychosocial assessment (located in shadow chart). CSW will initiate SNF search at this time and will continue to follow to facilitate d/c back to [facility] pt progresses medically.  Baxter Flattery, MSW [phone]

## 2012-01-18 ENCOUNTER — Encounter (HOSPITAL_COMMUNITY): Payer: Self-pay | Admitting: Internal Medicine

## 2012-01-18 ENCOUNTER — Other Ambulatory Visit: Payer: Self-pay | Admitting: Thoracic Surgery

## 2012-01-18 DIAGNOSIS — R55 Syncope and collapse: Secondary | ICD-10-CM

## 2012-01-18 DIAGNOSIS — C159 Malignant neoplasm of esophagus, unspecified: Secondary | ICD-10-CM

## 2012-01-18 DIAGNOSIS — I4430 Unspecified atrioventricular block: Secondary | ICD-10-CM

## 2012-01-18 LAB — BASIC METABOLIC PANEL
CO2: 25 mEq/L (ref 19–32)
Chloride: 111 mEq/L (ref 96–112)
Sodium: 143 mEq/L (ref 135–145)

## 2012-01-18 LAB — CBC
MCV: 89.1 fL (ref 78.0–100.0)
Platelets: 351 10*3/uL (ref 150–400)
RBC: 3.03 MIL/uL — ABNORMAL LOW (ref 3.87–5.11)
WBC: 8.5 10*3/uL (ref 4.0–10.5)

## 2012-01-18 MED ORDER — POTASSIUM CHLORIDE 20 MEQ/15ML (10%) PO LIQD
40.0000 meq | Freq: Every day | ORAL | Status: DC
  Administered 2012-01-18 – 2012-01-23 (×6): 40 meq via ORAL
  Filled 2012-01-18 (×8): qty 30

## 2012-01-18 MED ORDER — ATROPINE SULFATE 0.4 MG/ML IJ SOLN
0.4000 mg | Freq: Once | INTRAMUSCULAR | Status: AC | PRN
  Filled 2012-01-18: qty 1

## 2012-01-18 MED ORDER — METOPROLOL TARTRATE 25 MG/10 ML ORAL SUSPENSION
50.0000 mg | Freq: Two times a day (BID) | ORAL | Status: DC
  Filled 2012-01-18 (×3): qty 20

## 2012-01-18 MED ORDER — LORAZEPAM 0.5 MG PO TABS
0.2500 mg | ORAL_TABLET | Freq: Four times a day (QID) | ORAL | Status: DC | PRN
  Administered 2012-01-18 – 2012-01-30 (×15): 0.25 mg
  Filled 2012-01-18 (×13): qty 1

## 2012-01-18 NOTE — Progress Notes (Addendum)
[address]            [place] [address]          [phone]          Subjective: Less diarrhea, feels weak2  Objective  Telemetry SR, episode of CHB  Temp:  [97.4 F (36.3 C)-98.5 F (36.9 C)] 98.3 F (36.8 C) (02/12 0447) Pulse Rate:  [83-102] 83  (02/12 0447) Resp:  [18-20] 20  (02/12 0447) BP: (140-154)/(70-81) 146/74 mmHg (02/12 0447) SpO2:  [91 %-94 %] 91 % (02/12 0447)   Intake/Output Summary (Last 24 hours) at 01/18/12 1025 Last data filed at 01/18/12 0602  Gross per 24 hour  Intake    120 ml  Output   1400 ml  Net  -1280 ml       General appearance: alert, fatigued and no distress Heart: regular rate and rhythm and S1, S2 normal Lungs: clear to auscultation bilaterally Abdomen: soft, mild diffuse tenderness, + BS Extremities: no edema, redness or tenderness in the calves or thighs Wound: N/A  Lab Results:  [facility] 01/18/12 0511 01/16/12 0622  NA 143 138  K 3.2* 3.6  CL 111 102  CO2 25 24  GLUCOSE 103* 150*  BUN 9 10  CREATININE 0.75 0.73  CALCIUM 10.9* 11.7*  MG -- --  PHOS -- --    Basename 01/16/12 0622  AST 16  ALT 6  ALKPHOS 87  BILITOT 0.3  PROT 7.6  ALBUMIN 3.2*   No results found for this basename: LIPASE:2,AMYLASE:2 in the last 72 hours  Basename 01/18/12 0511 01/16/12 0622  WBC 8.5 8.4  NEUTROABS -- --  HGB 8.3* 9.4*  HCT 27.0* 30.8*  MCV 89.1 89.5  PLT 351 394   No results found for this basename: CKTOTAL:4,CKMB:4,TROPONINI:4 in the last 72 hours No components found with this basename: POCBNP:3 No results found for this basename: DDIMER in the last 72 hours No results found for this basename: HGBA1C in the last 72 hours No results found for this basename: CHOL,HDL,LDLCALC,TRIG,CHOLHDL in the last 72 hours No results found for this basename: TSH,T4TOTAL,FREET3,T3FREE,THYROIDAB in the last 72 hours No results found for this basename: VITAMINB12,FOLATE,FERRITIN,TIBC,IRON,RETICCTPCT  in the last 72 hours  Medications: Scheduled    . cycloSPORINE  1 drop Both Eyes BID  . enoxaparin  40 mg Subcutaneous QHS  . feeding supplement  1 Container Oral TID WC  . fentaNYL  25 mcg Intramuscular Once  . LORazepam  0.5 mg Oral QHS  . metoCLOPramide  10 mg Per Tube BID  . metoCLOPramide (REGLAN) injection  5 mg Intravenous Q8H  . metoprolol tartrate  75 mg Oral BID  . pantoprazole (PROTONIX) IV  40 mg Intravenous QHS  . tiZANidine  2 mg Per Tube BID  . vancomycin  125 mg Oral Q6H  . DISCONTD: metroNIDAZOLE  500 mg Per J Tube TID     Radiology/Studies:  No results found.  INR: Will add last result for INR, ABG once components are confirmed Will add last 4 CBG results once components are confirmed  Assessment/Plan: Will need to restart TF's or TNA soon for inadequate nutritional status. Will allow Dysphagia  2 diet which she gets at nursing facility with tube feeds as a supplement  Cont Vanco Decrease Beta blocker dose  LOS: 3 days    GOLD,[name] 2/12/201310:25 AM    Episodes on bradycardia  with eating and coughing Discussed with cardiology, question of aspiration induced cough and bradycardia NPO for now with tube feeding I have seen and examined Sterling Big and agree with the above assessment  and plan.  Delight Ovens MD Beeper [phone] Office [phone] 01/18/2012 7:32 PM

## 2012-01-18 NOTE — Consult Note (Signed)
ELECTROPHYSIOLOGY CONSULT NOTE  Primary Care Physician: Lenora Boys, MD, MD Referring Physician:  Dr Donata Clay  Admit Date: 01/15/2012  Reason for consultation:  AV block  [name] is a 78 y.o. female with a h/o esophageal cancer s/p recent surgical resection by Dr Edwyna Shell.  She did well initially post operative but did have some tachycardia for which she was placed on metoprolol.  She reports occasional positional dizziness but denies prior presyncope or syncope. She is now admitted after her J tube dislodged.  This has been replaced, however presently, she is being treated for Cdiff. This afternoon, she had placed a piece of ice in her mouth.  She states that she then "got strangled" on the water and began to cough.  While coughing, she had transient AV block (9 seconds) with brief syncope.  Upon waking, she returned to her usual health state.   While being examined by me, she drank V8 juice.  She then again became strangled and started to cough.  She had a 5.8 second pause with presyncope.  Today, she denies symptoms of chest pain, shortness of breath,lower extremity edema, or other concerncs. The patient is tolerating medications without difficulties and is otherwise without complaint today.   Past Medical History  Diagnosis Date  . Pernicious anemia   . Osteoarthritis   . Fibromyalgia   . Skin lesion     chronic calcific cutaneous lesions  . HX of multiple bleeding ulcers 09/01/2011  . Hypertension     takes Atenolol daily  . Emphysema   . Bronchitis     hx  . Adenocarcinoma of gastroesophageal junction   . Dizziness     d/t crystals in ears that "roll around"and can't get them out  . Back pain   . Osteoporosis   . Scoliosis   . Psoriasis   . H/O hiatal hernia   . GERD (gastroesophageal reflux disease)     takes Aciphex daily  . Gastric ulcer   . Constipation     since Chemo and Radiation;finished up in Nov 2012  . Diverticulosis   . History of colonic polyps     . Shingles     broke out 2wks ago;has been on meds and to finish up tomorrow  . Kidney stone 25+yrs ago  . Pernicious anemia   . Vitamin B deficiency    Past Surgical History  Procedure Date  . Right hip replacement 2001  . Tonsillectomy     as a child  . Tubal ligation 1975  . Appendectomy 1975  . Cataract surgery 10+yrs ago  . Esophagogastroduodenoscopy   . Colonoscopy   . Extubation 12/20/2011       . Partial esophagectomy 12/20/2011    Procedure: ESOPHAGECTOMY PARTIAL;  Surgeon: Norton Blizzard, MD;  Location: [facility] OR;  Service: Thoracic;  Laterality: N/A;       . cycloSPORINE  1 drop Both Eyes BID  . enoxaparin  40 mg Subcutaneous QHS  . feeding supplement  1 Container Oral TID WC  . fentaNYL  25 mcg Intramuscular Once  . LORazepam  0.5 mg Oral QHS  . metoCLOPramide  10 mg Per Tube BID  . metoCLOPramide (REGLAN) injection  5 mg Intravenous Q8H  . pantoprazole (PROTONIX) IV  40 mg Intravenous QHS  . potassium chloride  40 mEq Oral Daily  . tiZANidine  2 mg Per Tube BID  . vancomycin  125 mg Oral Q6H  . DISCONTD: metoprolol tartrate  50 mg Oral  BID  . DISCONTD: metoprolol tartrate  50 mg Oral BID  . DISCONTD: metoprolol tartrate  75 mg Oral BID  . DISCONTD: metroNIDAZOLE  500 mg Per J Tube TID      . sodium chloride 70 mL/hr at 01/18/12 0408    Allergies  Allergen Reactions  . Adhesive (Tape) Itching  . Biaxin Itching  . Darvon Itching  . Epinephrine Itching  . Morphine And Related Itching  . Oxycodone Itching  . Oxycontin Itching  . Sulfa Antibiotics Itching  . Vicodin (Hydrocodone-Acetaminophen) Itching    History   Social History  . Marital Status: Widowed    Spouse Name: N/A    Number of Children: 5  . Years of Education: N/A   Occupational History  . Not on file.   Social History Main Topics  . Smoking status: Former Games developer  . Smokeless tobacco: Never Used   Comment: quit 59yrs ago  . Alcohol Use: No  . Drug Use: No  . Sexually Active:  No   Other Topics Concern  . 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 Cancer Maternal Grandmother     gynecologic  . Cancer Maternal Aunt     gynecologic  . Breast cancer Paternal Aunt   . Breast cancer Paternal Aunt   . Breast cancer Paternal Aunt   . Anesthesia problems Neg Hx   . Hypotension Neg Hx   . Malignant hyperthermia Neg Hx   . Pseudochol deficiency Neg Hx     ROS- All systems are reviewed and negative except as per the HPI above  Physical Exam: Telemetry: Filed Vitals:   01/17/12 2147 01/18/12 0447 01/18/12 1400 01/18/12 1630  BP: 154/81 146/74 145/70 152/78  Pulse: 102 83 92 92  Temp: 98.5 F (36.9 C) 98.3 F (36.8 C) 97.9 F (36.6 C)   TempSrc: Oral Oral Oral   Resp: 20 20 18    Height:      Weight:      SpO2: 94% 91% 92% 92%    GEN- The patient is chronically ill appearing, alert and oriented x 3 today.   Head- normocephalic, atraumatic Eyes-  Sclera clear, conjunctiva pink Ears- hearing intact Oropharynx- clear Neck- supple, no JVP Lungs- Clear to ausculation bilaterally, normal work of breathing Heart- Regular rate and rhythm, 2/6 SEM LUSB (mid peaking) GI- soft, NT, ND, + BS Extremities- no clubbing, cyanosis, or edema MS- diffuse muscle atrophy Skin- no rash or lesion Psych- euthymic mood, full affect Neuro- strength and sensation are intact  EKG- 12/19/11- sinus rhythm PR 158, QRS 72, Qtc 419, otherwise normal ekg  Labs:   Lab Results  Component Value Date   WBC 8.5 01/18/2012   HGB 8.3* 01/18/2012   HCT 27.0* 01/18/2012   MCV 89.1 01/18/2012   PLT 351 01/18/2012    Lab 01/18/12 0511 01/16/12 0622  NA 143 --  K 3.2* --  CL 111 --  CO2 25 --  BUN 9 --  CREATININE 0.75 --  CALCIUM 10.9* --  PROT -- 7.6  BILITOT -- 0.3  ALKPHOS -- 87  ALT -- 6  AST -- 16  GLUCOSE 103* --    ASSESSMENT AND PLAN:    1.  AV block- the patient has had several episodes of transient AV block with symptoms or presyncope/ syncope.  These episodes appear to be triggered by cough/ strangling and are therefore most likely vagal in origin. I am concerned that she may be aspirating.  I will make her NPO until her swallowing function can be further assessed.  I will also keep her in bed. I have stopped metoprolol and will check TFTs/ echo. If she has unpravoked episodes of AV block then she may require pacing, however, if aspiration/ cough are her vagal triggers, then these should first be addressed. We will follow her closely with you. If further episodes occur, she may need to transfer to step down at that time. We will place Zoll pads as a precaution at this time.  2.  Cough/ aspiration with V8/ ice- concerning for aspiration,  Would consider speech eval if deemed appropriate by the primary team.   Hillis Range, MD 01/18/2012  5:44 PM

## 2012-01-18 NOTE — Progress Notes (Signed)
Pt C/o itching and stated that  She may be reacting to Flagyl. Pt requested that Flagyl be D/c. MD notified, new order received for Vancomycin 125 mg oral, and to D/c Flagyl 500 mg. Will continue plan of care.  ......A. Jeanelle Malling, RN

## 2012-01-18 NOTE — Progress Notes (Signed)
PT Cancellation Note  Treatment cancelled today due to patient's refusal to participate. Pt reports she is dizzy because she hasn't eaten and she has just gotten back to bed. Encouraged continued mobility with nursing.   WHITLOW,Yuan Gann HELEN 01/18/2012, 11:12 AM

## 2012-01-18 NOTE — Progress Notes (Signed)
RN heard noise and went in room, found Pt in chair where she had been, call bell dropped.  Pt stated, "I thought I was going to pass out."  Pt with 8 sec pause per telemetry.  Pt assisted back to bed, VSS with elevated BP 180's/80's.  HR 90-100.  K+ from this morning 3.2.  PA notified, orders received.  Rapid Response RN coming to eval.

## 2012-01-19 DIAGNOSIS — I319 Disease of pericardium, unspecified: Secondary | ICD-10-CM

## 2012-01-19 LAB — BASIC METABOLIC PANEL
BUN: 6 mg/dL (ref 6–23)
CO2: 23 mEq/L (ref 19–32)
Calcium: 10.6 mg/dL — ABNORMAL HIGH (ref 8.4–10.5)
Chloride: 111 mEq/L (ref 96–112)
Creatinine, Ser: 0.71 mg/dL (ref 0.50–1.10)
GFR calc Af Amer: >90 mL/min (ref >90)
GFR calc non Af Amer: 82 mL/min — ABNORMAL LOW (ref >90)
Glucose, Bld: 107 mg/dL — ABNORMAL HIGH (ref 70–99)
Potassium: 3.5 mEq/L (ref 3.5–5.1)
Sodium: 141 mEq/L (ref 135–145)

## 2012-01-19 LAB — T4, FREE: Free T4: 1.11 ng/dL (ref 0.80–1.80)

## 2012-01-19 MED ORDER — ACETAMINOPHEN 160 MG/5ML PO SOLN
650.0000 mg | Freq: Four times a day (QID) | ORAL | Status: DC | PRN
  Administered 2012-01-19 – 2012-01-30 (×9): 650 mg via JEJUNOSTOMY
  Filled 2012-01-19 (×9): qty 20.3

## 2012-01-19 MED ORDER — DIPHENHYDRAMINE HCL 50 MG/ML IJ SOLN
INTRAMUSCULAR | Status: AC
  Administered 2012-01-19: 12.5 mg
  Filled 2012-01-19: qty 1

## 2012-01-19 MED ORDER — JEVITY 1.2 CAL PO LIQD
1000.0000 mL | ORAL | Status: DC
  Administered 2012-01-19 – 2012-01-21 (×2): 1000 mL via ORAL
  Filled 2012-01-19 (×5): qty 1000

## 2012-01-19 NOTE — Progress Notes (Signed)
  Echocardiogram 2D Echocardiogram has been performed.  Juanita Laster Kais Monje 01/19/2012, 12:11 PM

## 2012-01-19 NOTE — Progress Notes (Signed)
Pt. HR up to 160, did not sustain, VS stable, pt stable, no complaints of pain or shortness of breath. Will continue to monitor.

## 2012-01-19 NOTE — Progress Notes (Signed)
                   [address]            [place] [address]          [phone]          Subjective: Appreciate EP evaluation, diarrhea improved  Objective  Telemetry Now ST at 120's, beta blocker on hold  Temp:  [97.9 F (36.6 C)-99.2 F (37.3 C)] 99.2 F (37.3 C) (02/13 0447) Pulse Rate:  [92-113] 113  (02/13 0447) Resp:  [18] 18  (02/13 0447) BP: (145-153)/(70-80) 153/80 mmHg (02/13 0447) SpO2:  [91 %-92 %] 92 % (02/13 0447)   Intake/Output Summary (Last 24 hours) at 01/19/12 0824 Last data filed at 01/19/12 0438  Gross per 24 hour  Intake    290 ml  Output    400 ml  Net   -110 ml       General appearance: alert, fatigued and no distress Heart: regular rate and rhythm and tachy Lungs: diminished in the bases Abdomen: soft, non distended Extremities: no edema, redness or tenderness in the calves or thighs Wound: incisions well healed  Lab Results:  Basename 01/19/12 0519 01/18/12 0511  NA 141 143  K 3.5 3.2*  CL 111 111  CO2 23 25  GLUCOSE 107* 103*  BUN 6 9  CREATININE 0.71 0.75  CALCIUM 10.6* 10.9*  MG -- --  PHOS -- --   No results found for this basename: AST:2,ALT:2,ALKPHOS:2,BILITOT:2,PROT:2,ALBUMIN:2 in the last 72 hours No results found for this basename: LIPASE:2,AMYLASE:2 in the last 72 hours  Basename 01/18/12 0511  WBC 8.5  NEUTROABS --  HGB 8.3*  HCT 27.0*  MCV 89.1  PLT 351   No results found for this basename: CKTOTAL:4,CKMB:4,TROPONINI:4 in the last 72 hours No components found with this basename: POCBNP:3 No results found for this basename: DDIMER in the last 72 hours No results found for this basename: HGBA1C in the last 72 hours No results found for this basename: CHOL,HDL,LDLCALC,TRIG,CHOLHDL in the last 72 hours No results found for this basename: TSH,T4TOTAL,FREET3,T3FREE,THYROIDAB in the last 72 hours No results found for this basename: VITAMINB12,FOLATE,FERRITIN,TIBC,IRON,RETICCTPCT in the last 72  hours  Medications: Scheduled    . cycloSPORINE  1 drop Both Eyes BID  . enoxaparin  40 mg Subcutaneous QHS  . feeding supplement  1 Container Oral TID WC  . fentaNYL  25 mcg Intramuscular Once  . LORazepam  0.5 mg Oral QHS  . metoCLOPramide  10 mg Per Tube BID  . metoCLOPramide (REGLAN) injection  5 mg Intravenous Q8H  . pantoprazole (PROTONIX) IV  40 mg Intravenous QHS  . potassium chloride  40 mEq Oral Daily  . tiZANidine  2 mg Per Tube BID  . vancomycin  125 mg Oral Q6H  . DISCONTD: metoprolol tartrate  50 mg Oral BID  . DISCONTD: metoprolol tartrate  50 mg Oral BID  . DISCONTD: metoprolol tartrate  75 mg Oral BID     Radiology/Studies:  No results found.  INR: Will add last result for INR, ABG once components are confirmed Will add last 4 CBG results once components are confirmed  Assessment/Plan: 1. Must be strict NPO, will start TF's as diarrhea somewhat better 2. Will need to decide on BETA BLOCKER, defer to EP    LOS: 4 days    Aislynn Cifelli E 2/13/20138:24 AM

## 2012-01-19 NOTE — Progress Notes (Signed)
[name] is a 78 y.o. female with recent esophageal surgery and aspiration now with vagal AV block in the setting of aspiration.   SUBJECTIVE: The patient is doing well today.  She has had no further AV block overnight and remains stable.     . cycloSPORINE  1 drop Both Eyes BID  . enoxaparin  40 mg Subcutaneous QHS  . fentaNYL  25 mcg Intramuscular Once  . LORazepam  0.5 mg Oral QHS  . metoCLOPramide  10 mg Per Tube BID  . metoCLOPramide (REGLAN) injection  5 mg Intravenous Q8H  . pantoprazole (PROTONIX) IV  40 mg Intravenous QHS  . potassium chloride  40 mEq Oral Daily  . tiZANidine  2 mg Per Tube BID  . vancomycin  125 mg Oral Q6H  . DISCONTD: feeding supplement  1 Container Oral TID WC  . DISCONTD: metoprolol tartrate  50 mg Oral BID  . DISCONTD: metoprolol tartrate  50 mg Oral BID  . DISCONTD: metoprolol tartrate  75 mg Oral BID      . sodium chloride 70 mL/hr at 01/18/12 1755  . feeding supplement (JEVITY 1.2)      OBJECTIVE: Physical Exam: Filed Vitals:   01/18/12 1400 01/18/12 1630 01/18/12 2159 01/19/12 0447  BP: 145/70 152/78 146/72 153/80  Pulse: 92 92 102 113  Temp: 97.9 F (36.6 C)  99.2 F (37.3 C) 99.2 F (37.3 C)  TempSrc: Oral  Oral Oral  Resp: 18  18 18   Height:      Weight:      SpO2: 92% 92% 91% 92%    Intake/Output Summary (Last 24 hours) at 01/19/12 0842 Last data filed at 01/19/12 0700  Gross per 24 hour  Intake    290 ml  Output    600 ml  Net   -310 ml    Telemetry reveals sinus rhythm without further AV block overnight,  Sinus tachy with nonsustained atrial tachycardia are observed.  GEN- The patient is thin and chronically ill appearing, alert and oriented x 3 today.   Head- normocephalic, atraumatic Eyes-  Sclera clear, conjunctiva pink Ears- hearing intact Oropharynx- clear Lungs- Clear to ausculation bilaterally, normal work of breathing Heart- Regular rate and rhythm  GI- soft, NT, ND, + BS Extremities- no  clubbing, cyanosis, or edema   LABS: Basic Metabolic Panel:  Basename 01/19/12 0519 01/18/12 0511  NA 141 143  K 3.5 3.2*  CL 111 111  CO2 23 25  GLUCOSE 107* 103*  BUN 6 9  CREATININE 0.71 0.75  CALCIUM 10.6* 10.9*  MG -- --  PHOS -- --   Liver Function Tests: No results found for this basename: AST:2,ALT:2,ALKPHOS:2,BILITOT:2,PROT:2,ALBUMIN:2 in the last 72 hours No results found for this basename: LIPASE:2,AMYLASE:2 in the last 72 hours CBC:  Basename 01/18/12 0511  WBC 8.5  NEUTROABS --  HGB 8.3*  HCT 27.0*  MCV 89.1  PLT 351   ASSESSMENT AND PLAN:  Active Problems:  AV block  Syncope  1. AV block- the patient has had several episodes of transient AV block with symptoms or presyncope/ syncope. These episodes appear to be triggered by cough/ strangling and are therefore most likely vagal in origin.  I have spoke with [name] and he states that she has known aspiration.  He and I agree that she is a poor candidate for PPM and that we should try to avoid this at this time.  She will remain strictly NPO with medications/ nutrition, etc via her  J tube.  No ice chips per mouth.  I have stopped metoprolol and will check TFTs/ echo.   We will follow her closely with you.  IF we find that she has unprovoked AV block or if she is felt to need to take POs prior to resolution of her aspiration, then we could further contemplate PPM at that time.   Hillis Range, MD 01/19/2012 8:42 AM

## 2012-01-20 DIAGNOSIS — I10 Essential (primary) hypertension: Secondary | ICD-10-CM

## 2012-01-20 LAB — BASIC METABOLIC PANEL WITH GFR
BUN: 5 mg/dL — ABNORMAL LOW (ref 6–23)
CO2: 20 meq/L (ref 19–32)
Calcium: 10.5 mg/dL (ref 8.4–10.5)
Chloride: 109 meq/L (ref 96–112)
Creatinine, Ser: 0.68 mg/dL (ref 0.50–1.10)
GFR calc Af Amer: >90 mL/min
GFR calc non Af Amer: 83 mL/min — ABNORMAL LOW
Glucose, Bld: 103 mg/dL — ABNORMAL HIGH (ref 70–99)
Potassium: 3.3 meq/L — ABNORMAL LOW (ref 3.5–5.1)
Sodium: 141 meq/L (ref 135–145)

## 2012-01-20 MED ORDER — POTASSIUM CHLORIDE 10 MEQ/100ML IV SOLN
10.0000 meq | INTRAVENOUS | Status: AC
  Administered 2012-01-20 (×2): 10 meq via INTRAVENOUS
  Filled 2012-01-20 (×2): qty 100

## 2012-01-20 MED ORDER — LISINOPRIL 5 MG PO TABS
5.0000 mg | ORAL_TABLET | Freq: Every day | ORAL | Status: DC
  Administered 2012-01-20 – 2012-01-22 (×3): 5 mg via ORAL
  Filled 2012-01-20 (×3): qty 1

## 2012-01-20 MED ORDER — DIPHENHYDRAMINE HCL 50 MG/ML IJ SOLN
INTRAMUSCULAR | Status: AC
  Administered 2012-01-20: 12.5 mg via INTRAVENOUS
  Filled 2012-01-20: qty 1

## 2012-01-20 MED ORDER — DIPHENHYDRAMINE HCL 50 MG/ML IJ SOLN
12.5000 mg | Freq: Four times a day (QID) | INTRAMUSCULAR | Status: DC | PRN
  Administered 2012-01-20 – 2012-01-31 (×21): 12.5 mg via INTRAVENOUS
  Filled 2012-01-20 (×21): qty 1

## 2012-01-20 NOTE — Progress Notes (Signed)
UR Completed.  [name] [phone] 01/20/2012  

## 2012-01-20 NOTE — Progress Notes (Signed)
UR Completed.  [name] Jane [phone] 01/20/2012  

## 2012-01-20 NOTE — Progress Notes (Signed)
Nutrition Follow-up  Diet Order:  NPO Jevity 1.2 advancing to a goal rate of 60 ml/h providing  1728 kcal and 80 gm protein with free water flushes 200 ml q 8 hours. Currently at 20 ml/hr.   Patient with 10-12 loose stools over night, C. Diff positive on 2/11. Patient was resting at time of RD visit.  Meds: Scheduled Meds:   . cycloSPORINE  1 drop Both Eyes BID  . diphenhydrAMINE      . diphenhydrAMINE      . enoxaparin  40 mg Subcutaneous QHS  . fentaNYL  25 mcg Intramuscular Once  . lisinopril  5 mg Oral Daily  . LORazepam  0.5 mg Oral QHS  . metoCLOPramide (REGLAN) injection  5 mg Intravenous Q8H  . pantoprazole (PROTONIX) IV  40 mg Intravenous QHS  . potassium chloride  10 mEq Intravenous Q1 Hr x 2  . potassium chloride  40 mEq Oral Daily  . tiZANidine  2 mg Per Tube BID  . vancomycin  125 mg Oral Q6H  . DISCONTD: metoCLOPramide  10 mg Per Tube BID   Continuous Infusions:   . sodium chloride 70 mL/hr at 01/19/12 1132  . feeding supplement (JEVITY 1.2) 1,000 mL (01/19/12 1152)   PRN Meds:.acetaminophen (TYLENOL) oral liquid 160 mg/5 mL, diphenhydrAMINE, diphenhydrAMINE, food thickener, LORazepam, ondansetron (ZOFRAN) IV, oxyCODONE-acetaminophen  Labs:  CMP     Component Value Date/Time   NA 141 01/20/2012 0505   K 3.3* 01/20/2012 0505   CL 109 01/20/2012 0505   CO2 20 01/20/2012 0505   GLUCOSE 103* 01/20/2012 0505   BUN 5* 01/20/2012 0505   CREATININE 0.68 01/20/2012 0505   CALCIUM 10.5 01/20/2012 0505   PROT 7.6 01/16/2012 0622   ALBUMIN 3.2* 01/16/2012 0622   AST 16 01/16/2012 0622   ALT 6 01/16/2012 0622   ALKPHOS 87 01/16/2012 0622   BILITOT 0.3 01/16/2012 0622   GFRNONAA 83* 01/20/2012 0505   GFRAA >90 01/20/2012 0505     Intake/Output Summary (Last 24 hours) at 01/20/12 0924 Last data filed at 01/19/12 2100  Gross per 24 hour  Intake      0 ml  Output    201 ml  Net   -201 ml    Weight Status:  150 lbs, no new weights obtained  Re-estimated needs:  Unchanged  1700-1900 kcal and 75-85 gm protein  Nutrition Dx:  Inadequate enteral nutrition infusion, progressing  Goal:  EN will meet >90% of estimated nutrition needs, currently unmet  Intervention:   1. Agree with current EN, continue with advancement to goal as tolerated.  2. RD will continue to follow  Monitor:  EN rate/tolerance, weight, labs, I/O's   Rudean Haskell Pager #:  [phone]

## 2012-01-20 NOTE — Progress Notes (Addendum)
                    [address]            [place] [address]          [phone]          Subjective: Reports having 10-12 loose stools overnight.  Feels weak, fatigued.  Objective: Vital signs in last 24 hours: Patient Vitals for the past 24 hrs:  BP Temp Temp src Pulse Resp SpO2  01/20/12 0349 163/88 mmHg 98.3 F (36.8 C) Oral 131  19  92 %  01/19/12 2142 165/85 mmHg 98.8 F (37.1 C) Oral 125  18  93 %  01/19/12 1541 162/83 mmHg 98.4 F (36.9 C) Oral 124  18  91 %   Current Weight  01/15/12 68.04 kg (150 lb)     Intake/Output from previous day: 02/13 0701 - 02/14 0700 In: 0  Out: 201 [Urine:200; Stool:1]    PHYSICAL EXAM:  Heart: tachy, 110s Lungs: clear Abdomen: soft, diffusely tender, Hyperactive BS   Lab Results: CBC: Basename 01/18/12 0511  WBC 8.5  HGB 8.3*  HCT 27.0*  PLT 351   BMET:  Basename 01/20/12 0505 01/19/12 0519  NA 141 141  K 3.3* 3.5  CL 109 111  CO2 20 23  GLUCOSE 103* 107*  BUN 5* 6  CREATININE 0.68 0.71  CALCIUM 10.5 10.6*    PT/INR: No results found for this basename: LABPROT,INR in the last 72 hours  Echo: EF 55-60%, trivial AI/MR/TR/PR, No ASD/PFO, small pericardial effusion  Assessment/Plan: C.diff- Continue Vanc D#3.  Still very symptomatic.  Will monitor. AVB- appreciate EP's assistance.  Holding on beta blocker for now.  No more AVB, HR 110-120s, ST. Continue NPO, TFs. Hypokalemia- replace K+   LOS: 5 days    COLLINS,GINA H 01/20/2012   patient examined and medical record reviewed,agree with above note. VAN TRIGT III,Lynniah Janoski 01/20/2012

## 2012-01-20 NOTE — Progress Notes (Signed)
[name] is a 78 y.o. female with recent esophageal surgery and aspiration now with vagal AV block in the setting of aspiration.   SUBJECTIVE: The patient is doing well today.  She has had no further AV block overnight and remains stable.  + diarrhea ongoing     . cycloSPORINE  1 drop Both Eyes BID  . diphenhydrAMINE      . diphenhydrAMINE      . enoxaparin  40 mg Subcutaneous QHS  . fentaNYL  25 mcg Intramuscular Once  . LORazepam  0.5 mg Oral QHS  . metoCLOPramide  10 mg Per Tube BID  . metoCLOPramide (REGLAN) injection  5 mg Intravenous Q8H  . pantoprazole (PROTONIX) IV  40 mg Intravenous QHS  . potassium chloride  40 mEq Oral Daily  . tiZANidine  2 mg Per Tube BID  . vancomycin  125 mg Oral Q6H  . DISCONTD: feeding supplement  1 Container Oral TID WC      . sodium chloride 70 mL/hr at 01/19/12 1132  . feeding supplement (JEVITY 1.2) 1,000 mL (01/19/12 1152)    OBJECTIVE: Physical Exam: Filed Vitals:   01/19/12 0447 01/19/12 1541 01/19/12 2142 01/20/12 0349  BP: 153/80 162/83 165/85 163/88  Pulse: 113 124 125 131  Temp: 99.2 F (37.3 C) 98.4 F (36.9 C) 98.8 F (37.1 C) 98.3 F (36.8 C)  TempSrc: Oral Oral Oral Oral  Resp: 18 18 18 19   Height:      Weight:      SpO2: 92% 91% 93% 92%    Intake/Output Summary (Last 24 hours) at 01/20/12 0748 Last data filed at 01/19/12 2100  Gross per 24 hour  Intake      0 ml  Output    201 ml  Net   -201 ml    Telemetry reveals sinus rhythm without further AV block overnight,  Sinus tachy with nonsustained atrial tachycardia are observed.  GEN- The patient is thin and chronically ill appearing, alert and oriented x 3 today.   Head- normocephalic, atraumatic Eyes-  Sclera clear, conjunctiva pink Ears- hearing intact Oropharynx- clear Lungs- Clear to ausculation bilaterally, normal work of breathing Heart- Regular rate and rhythm  GI- soft, NT, ND, + BS Extremities- no clubbing, cyanosis, or  edema   LABS: Basic Metabolic Panel:  Basename 01/20/12 0505 01/19/12 0519  NA 141 141  K 3.3* 3.5  CL 109 111  CO2 20 23  GLUCOSE 103* 107*  BUN 5* 6  CREATININE 0.68 0.71  CALCIUM 10.5 10.6*  MG -- --  PHOS -- --   Liver Function Tests: No results found for this basename: AST:2,ALT:2,ALKPHOS:2,BILITOT:2,PROT:2,ALBUMIN:2 in the last 72 hours No results found for this basename: LIPASE:2,AMYLASE:2 in the last 72 hours CBC:  Basename 01/18/12 0511  WBC 8.5  NEUTROABS --  HGB 8.3*  HCT 27.0*  MCV 89.1  PLT 351   ASSESSMENT AND PLAN:  Active Problems:  AV block  Syncope  1. AV block- the patient has had several episodes of transient AV block with symptoms or presyncope/ syncope. These episodes appear to be triggered by cough/ strangling and are therefore most likely vagal in origin.   She will remain strictly NPO with medications/ nutrition, etc via her J tube.  No ice chips per mouth. Echo/ TFTs reviewed  IF we find that she has unprovoked AV block or if she is felt to need to take POs prior to resolution of her aspiration, then we could further contemplate PPM  at that time.  2. HTN-  Above goal Replete K Add lisinopril 5mg  daily. Titrate as BP requires. Would avoid beta blockers at this time  I will see as needed Please call if further AV block occurs  Hillis Range, MD 01/20/2012 7:48 AM

## 2012-01-21 LAB — CBC
MCH: 27.3 pg (ref 26.0–34.0)
MCV: 88.7 fL (ref 78.0–100.0)
Platelets: 401 10*3/uL — ABNORMAL HIGH (ref 150–400)
RBC: 3.26 MIL/uL — ABNORMAL LOW (ref 3.87–5.11)
RDW: 15.7 % — ABNORMAL HIGH (ref 11.5–15.5)
WBC: 8.1 10*3/uL (ref 4.0–10.5)

## 2012-01-21 LAB — BASIC METABOLIC PANEL
CO2: 24 mEq/L (ref 19–32)
Calcium: 11.2 mg/dL — ABNORMAL HIGH (ref 8.4–10.5)
Creatinine, Ser: 0.78 mg/dL (ref 0.50–1.10)
GFR calc non Af Amer: 80 mL/min — ABNORMAL LOW (ref >90)
Glucose, Bld: 112 mg/dL — ABNORMAL HIGH (ref 70–99)
Sodium: 139 mEq/L (ref 135–145)

## 2012-01-21 MED ORDER — BUPROPION HCL 75 MG PO TABS
75.0000 mg | ORAL_TABLET | Freq: Two times a day (BID) | ORAL | Status: DC
  Administered 2012-01-21 – 2012-01-31 (×20): 75 mg via ORAL
  Filled 2012-01-21 (×22): qty 1

## 2012-01-21 NOTE — Progress Notes (Addendum)
   Subjective: Patient reports less loose stools.  Objective: Vital signs in last 24 hours: Patient Vitals for the past 24 hrs:  BP Temp Temp src Pulse Resp SpO2  01/21/12 0501 152/82 mmHg 97.9 F (36.6 C) Oral 110  18  93 %  01/20/12 2323 160/86 mmHg 98.5 F (36.9 C) Oral 112  18  93 %  01/20/12 2154 176/90 mmHg 98.6 F (37 C) Oral 117  18  93 %  01/20/12 1401 155/83 mmHg 97.7 F (36.5 C) Oral 107  18  93 %    Current Weight  01/15/12 150 lb (68.04 kg)      Intake/Output from previous day: 02/14 0701 - 02/15 0700 In: 1080 [I.V.:840] Out: 225 [Urine:225]   Physical Exam:  Cardiovascular:Tachycardic Pulmonary: Clear to auscultation bilaterally; no rales, wheezes, or rhonchi. Abdomen: Soft, non tender, bowel sounds present. Wounds: Clean and dry.  No erythema or signs of infection.  Lab Results: CBC: Basename 01/21/12 0500  WBC 8.1  HGB 8.9*  HCT 28.9*  PLT 401*   BMET:  Basename 01/21/12 0500 01/20/12 0505  NA 139 141  K 3.5 3.3*  CL 107 109  CO2 24 20  GLUCOSE 112* 103*  BUN 5* 5*  CREATININE 0.78 0.68  CALCIUM 11.2* 10.5    PT/INR: No results found for this basename: LABPROT,INR in the last 72 hours ABG:  INR: Will add last result for INR, ABG once components are confirmed Will add last 4 CBG results once components are confirmed  Assessment/Plan:  1. CV - Previous vagal AVB.Beta blocker stopped. HR remains in 100's. Per  EPS. 2.C Dif-On Vancomycin (Day#4).  ZIMMERMAN,DONIELLE MPA-C 01/21/2012   patient examined and medical record reviewed,agree with above note. Patient requests antidepressant will start wellbutrin crushed in feeding tuibe VAN TRIGT III,Sacha Radloff 01/21/2012

## 2012-01-21 NOTE — Progress Notes (Signed)
CSW reviewed chart and staffed pt with RNCM. Will continue to follow to coordinate d/c planning when pt ready for SNF.  Baxter Flattery, MSW [phone]

## 2012-01-21 NOTE — Progress Notes (Signed)
Physical Therapy Cancellation Note: Pt currently on bedrest per latest order. Will await activity clearance to pursue mobility. Will check back as time allows. Thanks Toney Sang, PT [phone]

## 2012-01-22 MED ORDER — LISINOPRIL 10 MG PO TABS
10.0000 mg | ORAL_TABLET | Freq: Every day | ORAL | Status: DC
  Administered 2012-01-23 – 2012-01-31 (×9): 10 mg via ORAL
  Filled 2012-01-22 (×10): qty 1

## 2012-01-22 MED ORDER — LISINOPRIL 5 MG PO TABS
5.0000 mg | ORAL_TABLET | Freq: Once | ORAL | Status: DC
  Filled 2012-01-22: qty 1

## 2012-01-22 MED ORDER — LISINOPRIL 5 MG PO TABS
5.0000 mg | ORAL_TABLET | Freq: Once | ORAL | Status: AC
  Administered 2012-01-22: 5 mg
  Filled 2012-01-22: qty 1

## 2012-01-22 MED ORDER — JEVITY 1.2 CAL PO LIQD
1000.0000 mL | ORAL | Status: DC
  Administered 2012-01-23: 60 mL/h
  Administered 2012-01-23: 40 mL/h
  Administered 2012-01-23 – 2012-01-25 (×3): 1000 mL
  Administered 2012-01-27: 08:00:00
  Administered 2012-01-28: 1000 mL
  Administered 2012-01-28: 16:00:00
  Administered 2012-01-30: 1000 mL
  Filled 2012-01-22 (×18): qty 1000

## 2012-01-22 NOTE — Progress Notes (Addendum)
                    [address]            [place] [address]          [phone]          Subjective: Fewer loose stools.  Objective: Vital signs in last 24 hours: Patient Vitals for the past 24 hrs:  BP Temp Temp src Pulse Resp SpO2  01/22/12 0602 160/93 mmHg 99 F (37.2 C) Oral 119  19  92 %  01/21/12 2139 157/77 mmHg 98.3 F (36.8 C) Oral 109  18  93 %  01/21/12 1713 162/81 mmHg - - 118  18  -  01/21/12 1443 126/80 mmHg - - - - -  01/21/12 1254 180/92 mmHg 98 F (36.7 C) - 110  18  93 %   Current Weight  01/15/12 68.04 kg (150 lb)     Intake/Output from previous day: 02/15 0701 - 02/16 0700 In: 240 [P.O.:240] Out: 201 [Urine:200; Stool:1]    PHYSICAL EXAM:  Heart: RRR Lungs:clear Abdomen: soft, still diffusely tender, but better, not distended, +BS  Lab Results: CBC: Basename 01/21/12 0500  WBC 8.1  HGB 8.9*  HCT 28.9*  PLT 401*   BMET:  Basename 01/21/12 0500 01/20/12 0505  NA 139 141  K 3.5 3.3*  CL 107 109  CO2 24 20  GLUCOSE 112* 103*  BUN 5* 5*  CREATININE 0.78 0.68  CALCIUM 11.2* 10.5    PT/INR: No results found for this basename: LABPROT,INR in the last 72 hours   Assessment/Plan: C.diff- symptomatically improving. Continue Vanc D#5. HTN- will increase ACE-I and watch. NPO, TFs AVB - no beta blocker per EP.  HR 110s, stable.   LOS: 7 days    COLLINS,GINA H 01/22/2012   I have seen the patient and agree with the assessment and plan as outlined.  Lyrah Bradt H 01/22/2012 1:19 PM

## 2012-01-22 NOTE — Progress Notes (Signed)
01/22/2012 1630 Nursing Note:   Patient [name] consistently greater than 160 today. Coral Ceo Pa-C made aware. Orders received. Will continue to closely monitor patient.  Shawntelle Ungar, Blanchard Kelch

## 2012-01-23 MED ORDER — HYDROCODONE-ACETAMINOPHEN 7.5-500 MG/15ML PO SOLN
10.0000 mL | ORAL | Status: DC | PRN
  Administered 2012-01-23 – 2012-01-31 (×13): 10 mL
  Filled 2012-01-23 (×15): qty 15

## 2012-01-23 MED ORDER — FREE WATER
200.0000 mL | Freq: Three times a day (TID) | Status: DC
  Administered 2012-01-22 – 2012-01-31 (×26): 200 mL

## 2012-01-23 NOTE — Progress Notes (Addendum)
                    [address]            [place] [address]          [phone]          Subjective: Fewer loose stools, but reports nausea this am.  Objective: Vital signs in last 24 hours: Patient Vitals for the past 24 hrs:  BP Temp Temp src Pulse Resp SpO2  01/23/12 0408 152/83 mmHg 98.3 F (36.8 C) Oral 117  19  91 %  01/22/12 2000 155/95 mmHg 98.8 F (37.1 C) Oral 115  18  90 %  01/22/12 1822 158/91 mmHg - - 116  - -  01/22/12 1418 173/93 mmHg 98.9 F (37.2 C) Oral 120  20  93 %   Current Weight  01/15/12 68.04 kg (150 lb)     Intake/Output from previous day: 02/16 0701 - 02/17 0700 In: 1250.7 [I.V.:550.7] Out: 650 [Urine:650]    PHYSICAL EXAM:  Heart: RRR Lungs: clear Abdomen: soft, NT/ND, +BS  Lab Results: CBC: Basename 01/21/12 0500  WBC 8.1  HGB 8.9*  HCT 28.9*  PLT 401*   BMET:  Basename 01/21/12 0500  NA 139  K 3.5  CL 107  CO2 24  GLUCOSE 112*  BUN 5*  CREATININE 0.78  CALCIUM 11.2*    PT/INR: No results found for this basename: LABPROT,INR in the last 72 hours   Assessment/Plan: C. Diff- continue Vanc D#6. HTn- BPs better. Will increase ACE-I. Continue NPO, TFs. AVB- no beta blocker.  HR stable.   LOS: 8 days    COLLINS,GINA H 01/23/2012   I have seen and examined the patient and agree with the assessment and plan as outlined.  Jasper Ruminski H 01/23/2012 2:07 PM

## 2012-01-24 ENCOUNTER — Telehealth: Payer: Self-pay | Admitting: *Deleted

## 2012-01-24 LAB — PREALBUMIN: Prealbumin: 12.4 mg/dL — ABNORMAL LOW (ref 17.0–34.0)

## 2012-01-24 MED ORDER — ENOXAPARIN SODIUM 30 MG/0.3ML ~~LOC~~ SOLN
30.0000 mg | Freq: Every day | SUBCUTANEOUS | Status: DC
  Administered 2012-01-24 – 2012-01-30 (×7): 30 mg via SUBCUTANEOUS
  Filled 2012-01-24 (×8): qty 0.3

## 2012-01-24 MED ORDER — METOPROLOL TARTRATE 12.5 MG HALF TABLET
12.5000 mg | ORAL_TABLET | Freq: Two times a day (BID) | ORAL | Status: DC
  Administered 2012-01-24 – 2012-01-29 (×12): 12.5 mg via ORAL
  Filled 2012-01-24 (×17): qty 1

## 2012-01-24 NOTE — Progress Notes (Signed)
                                                  Subjective: Back in town. Patient very weak. Wounds ok . Patient NPO. Diarrhea improved.Plan to restart lopressor,dysphagia one diet, increase activity,strop reglan,repeat lab.Objective: Vital signs in last 24 hours: Temp:  [97.7 F (36.5 C)-99 F (37.2 C)] 99 F (37.2 C) (02/18 0500) Pulse Rate:  [112-120] 112  (02/18 0500) Cardiac Rhythm:  [-] Sinus tachycardia (02/18 0810) Resp:  [18-20] 18  (02/18 0500) BP: (150-162)/(80-91) 150/82 mmHg (02/18 0500) SpO2:  [90 %-95 %] 90 % (02/18 0500)  Hemodynamic parameters for last 24 hours:    Intake/Output from previous day: 02/17 0701 - 02/18 0700 In: 3235.3 [I.V.:1675.3] Out: 2354 [Urine:2350; Stool:4] Intake/Output this shift:    General appearance: alert Heart: regular rate and rhythm, S1, S2 normal, no murmur, click, rub or gallop Lungs: clear to auscultation bilaterally Abdomen: soft, non-tender; bowel sounds normal; no masses,  no organomegaly  Lab Results: No results found for this basename: WBC:2,HGB:2,HCT:2,PLT:2 in the last 72 hours BMET: No results found for this basename: NA:2,K:2,CL:2,CO2:2,GLUCOSE:2,BUN:2,CREATININE:2,CALCIUM:2 in the last 72 hours  PT/INR: No results found for this basename: LABPROT,INR in the last 72 hours ABG    Component Value Date/Time   PHART 7.374 12/21/2011 0400   HCO3 21.9 12/21/2011 0400   TCO2 23 12/21/2011 0400   ACIDBASEDEF 3.0* 12/21/2011 0400   O2SAT 95.0 12/21/2011 0400   CBG (last 3)  No results found for this basename: GLUCAP:3 in the last 72 hours  Assessment/Plan: S/P   See orders   LOS: 9 days    [name] [name] 01/24/2012

## 2012-01-24 NOTE — Telephone Encounter (Signed)
Asked to speak with NP about Lyris's current condition and care. Non urgent.

## 2012-01-24 NOTE — Progress Notes (Signed)
Physical Therapy Treatment Patient Details Name: [name] MRN: [id number] DOB: 02/21/1934 Today's Date: 01/24/2012  PT Assessment/Plan  PT - Assessment/Plan Comments on Treatment Session: Pt self-limiting and deferred any further ambulation or mobility. Initiation of session pt able to bridge in bed Independently onto bedpan but required assist for pericare. Pt benefits from encouragement and reassurance. Pt asked to sit up at least 1 hr, RN aware. Will continue to follow to maximize function. PT Plan: Discharge plan remains appropriate;Frequency remains appropriate PT Frequency: Min 3X/week Follow Up Recommendations: Skilled nursing facility PT Goals  Acute Rehab PT Goals PT Goal: Rolling Supine to Left Side - Progress: Met PT Goal: Supine/Side to Sit - Progress: Met PT Goal: Ambulate - Progress: Progressing toward goal (very slowly due to week of bedrest)  PT Treatment Precautions/Restrictions  Precautions Precautions: Fall Precaution Comments: PEG Restrictions Weight Bearing Restrictions: No Mobility (including Balance) Bed Mobility Rolling Left: 6: Modified independent (Device/Increase time) Left Sidelying to Sit: 6: Modified independent (Device/Increase time);HOB flat Sitting - Scoot to Edge of Bed: 6: Modified independent (Device/Increase time) Transfers Sit to Stand: 5: Supervision;From bed;From chair/3-in-1 Sit to Stand Details (indicate cue type and reason): cues for safety Stand to Sit: 5: Supervision;To chair/3-in-1 Stand to Sit Details: cues for hand placement to reach back for surface before sitting Stand Pivot Transfers: 5: Supervision;With armrests Stand Pivot Transfer Details (indicate cue type and reason): bed to chair transfer Ambulation/Gait Ambulation/Gait Assistance: 4: Min assist Ambulation/Gait Assistance Details (indicate cue type and reason): minguard with VC to step into the RW Ambulation Distance (Feet): 20 Feet Assistive device: Rolling  walker Gait Pattern: Trunk flexed;Decreased stride length Stairs: No  Posture/Postural Control Posture/Postural Control: No significant limitations Exercise  General Exercises - Lower Extremity Long Arc Quad: AROM;15 reps;Both;Seated Hip ABduction/ADduction: AROM;Both;15 reps;Seated Hip Flexion/Marching: AROM;Both;Seated (15reps) Toe Raises: AROM;Both;15 reps;Seated Heel Raises: AROM;Both;15 reps;Seated End of Session PT - End of Session Activity Tolerance: Patient tolerated treatment well Patient left: in chair;with call bell in reach Nurse Communication: Mobility status for transfers;Mobility status for ambulation General Behavior During Session: [facility] for tasks performed Cognition: [facility] for tasks performed  Delorse Lek 01/24/2012, 3:55 PM Toney Sang, PT [phone]

## 2012-01-24 NOTE — Progress Notes (Signed)
UR Completed.  [name] [phone] 01/24/2012  

## 2012-01-25 ENCOUNTER — Inpatient Hospital Stay (HOSPITAL_COMMUNITY): Payer: Medicare Other

## 2012-01-25 ENCOUNTER — Ambulatory Visit: Payer: Self-pay | Admitting: Thoracic Surgery

## 2012-01-25 LAB — COMPREHENSIVE METABOLIC PANEL
ALT: 6 U/L (ref 0–35)
AST: 14 U/L (ref 0–37)
Albumin: 2.9 g/dL — ABNORMAL LOW (ref 3.5–5.2)
Alkaline Phosphatase: 68 U/L (ref 39–117)
Calcium: 11.2 mg/dL — ABNORMAL HIGH (ref 8.4–10.5)
GFR calc Af Amer: >90 mL/min (ref >90)
Potassium: 3.7 mEq/L (ref 3.5–5.1)
Sodium: 136 mEq/L (ref 135–145)
Total Protein: 6.7 g/dL (ref 6.0–8.3)

## 2012-01-25 LAB — CBC
Hemoglobin: 9 g/dL — ABNORMAL LOW (ref 12.0–15.0)
MCH: 26.9 pg (ref 26.0–34.0)
MCHC: 30.3 g/dL (ref 30.0–36.0)
Platelets: 392 10*3/uL (ref 150–400)

## 2012-01-25 MED ORDER — PANTOPRAZOLE SODIUM 40 MG PO PACK
40.0000 mg | PACK | Freq: Every day | ORAL | Status: DC
  Administered 2012-01-25 – 2012-01-26 (×2): 40 mg
  Filled 2012-01-25 (×6): qty 20

## 2012-01-25 NOTE — Progress Notes (Signed)
CSW reviewed chart and note plan for d/c later this week. Confirmed pt has bed hold at [facility] and private room, facility made aware of cdif status. Will continue to follow for d/c planning as pt progresses.  Baxter Flattery, MSW [phone]

## 2012-01-25 NOTE — Progress Notes (Signed)
                                                  Subjective: Prealbumin is 12. Complains of sinus drainage. Will get speech therapy consult. Discharge planning for thurs. Lab stable.   Objective: Vital signs in last 24 hours: Temp:  [98.1 F (36.7 C)-98.4 F (36.9 C)] 98.4 F (36.9 C) (02/19 0503) Pulse Rate:  [109-117] 117  (02/19 0503) Cardiac Rhythm:  [-] Sinus tachycardia (02/18 1958) Resp:  [18] 18  (02/19 0503) BP: (128-162)/(77-90) 138/77 mmHg (02/19 0503) SpO2:  [91 %-92 %] 91 % (02/19 0503)  Hemodynamic parameters for last 24 hours:    Intake/Output from previous day: 02/18 0701 - 02/19 0700 In: -  Out: 550 [Urine:550] Intake/Output this shift:    General appearance: alert Lungs: clear to auscultation bilaterally  Lab Results:  [facility] 01/25/12 0703  WBC 9.3  HGB 9.0*  HCT 29.7*  PLT 392   BMET:  Basename 01/25/12 0703  NA 136  K 3.7  CL 101  CO2 29  GLUCOSE 110*  BUN 10  CREATININE 0.69  CALCIUM 11.2*    PT/INR: No results found for this basename: LABPROT,INR in the last 72 hours ABG    Component Value Date/Time   PHART 7.374 12/21/2011 0400   HCO3 21.9 12/21/2011 0400   TCO2 23 12/21/2011 0400   ACIDBASEDEF 3.0* 12/21/2011 0400   O2SAT 95.0 12/21/2011 0400   CBG (last 3)  No results found for this basename: GLUCAP:3 in the last 72 hours  Assessment/Plan: S/P   Speech therapy consult DC thurs   LOS: 10 days    [name] [name] 01/25/2012

## 2012-01-25 NOTE — Progress Notes (Signed)
Speech Language/Pathology Clinical/Bedside Swallow Evaluation Patient Details  Name: [name] MRN: [id number] DOB: 04-Feb-1934 Today's Date: 01/25/2012  Past Medical History:  Past Medical History  Diagnosis Date  . Pernicious anemia   . Osteoarthritis   . Fibromyalgia   . Skin lesion     chronic calcific cutaneous lesions  . HX of multiple bleeding ulcers 09/01/2011  . Hypertension     takes Atenolol daily  . Emphysema   . Bronchitis     hx  . Adenocarcinoma of gastroesophageal junction   . Dizziness     d/t crystals in ears that "roll around"and can't get them out  . Back pain   . Osteoporosis   . Scoliosis   . Psoriasis   . H/O hiatal hernia   . GERD (gastroesophageal reflux disease)     takes Aciphex daily  . Gastric ulcer   . Constipation     since Chemo and Radiation;finished up in Nov 2012  . Diverticulosis   . History of colonic polyps   . Shingles     broke out 2wks ago;has been on meds and to finish up tomorrow  . Kidney stone 25+yrs ago  . Pernicious anemia   . Vitamin B deficiency    Past Surgical History:  Past Surgical History  Procedure Date  . Right hip replacement 2001  . Tonsillectomy     as a child  . Tubal ligation 1975  . Appendectomy 1975  . Cataract surgery 10+yrs ago  . Esophagogastroduodenoscopy   . Colonoscopy   . Extubation 12/20/2011       . Partial esophagectomy 12/20/2011    Procedure: ESOPHAGECTOMY PARTIAL;  Surgeon: Norton Blizzard, MD;  [facility]: [facility];  Service: Thoracic;  Laterality: N/A;   HPI:  Patient is a 78 year old Caucasian female who was found to have esophageal cancer Stage III (adenocarcinoma) . She underwent radiation and chemotherapy. She presented to [facility] to undergo a transhiatal esophagectomy 12/20/11, intubated but immediately extubated post surgery. Pt presented with s/s of aspiration with thin liquids s/p surgery, which were exacerbated by a chin tuck. Pt was readmitted from SNF 2/9 when her J tube  was dislodged. Pt continues to c/o coughing and now reports episodes of syncope with swallowing attempts, making her afraid to eat/drink. SLP consult was requested given continued s/s of aspiration.  Assessment/Recommendations/Treatment Plan SLP Assessment Clinical Impression Statement: Pt well known under SLP service secondary to dysphagia s/p transhiatal esophagectomy on previous admission. Pt presents with continued c/o getting "strangled" while eating and drinking. Vocal quality remains hoarse without improvement. S/s of aspiration include a wet vocal quality with nectar thick liquids. Given wet vocal quality, weak cough, hoarse vocal quality, prolonged dysphagia s/p surgery, and MD reports of significant coughing episodes with pos resulting in syncope, recommend objective test to assess oropharyngeal function and airway protection.  Risk for Aspiration: Severe Other Related Risk Factors: History of GERD;History of esophageal-related issues  Swallow Evaluation Recommendations Recommended Consults: MBS Solid Consistency: Dysphagia 1 (Puree) Liquid Consistency: Nectar Liquid Administration via: Cup;No straw Medication Administration: Crushed with puree Supervision: Full supervision/cueing for compensatory strategies;Patient able to self feed Compensations: Slow rate;Small sips/bites;Multiple dry swallows after each bite/sip Postural Changes and/or Swallow Maneuvers: Seated upright 90 degrees;Upright 30-60 min after meal Oral Care Recommendations: Oral care BID Follow up Recommendations: Other (comment) (TBD pending MBSS)  Treatment Plan Treatment Plan Recommendations: F/U MBS in ___ days (Comment);Therapy as outlined in treatment plan below (MBS 2/20; tx TBD pending  MBS results)  Prognosis Prognosis for Safe Diet Advancement: Good Barriers to Reach Goals: Time post onset;Severity of dysphagia  Individuals Consulted Consulted and Agree with Results and Recommendations:  Patient  General  Date of Onset: 12/20/11 Type of Study: Bedside swallow evaluation Diet Prior to this Study: Dysphagia 1 (puree);Nectar-thick liquids;Other (Comment) (J tube) Temperature Spikes Noted: No Respiratory Status: Room air History of Intubation: Yes Length of Intubations (days): 1 days Date extubated: 12/20/11 Behavior/Cognition: Alert;Cooperative Oral Cavity - Dentition: Adequate natural dentition Vision: Functional for self-feeding Patient Positioning: Upright in bed Baseline Vocal Quality: Hoarse;Low vocal intensity Volitional Cough: Weak Volitional Swallow: Able to elicit    Maxcine Ham 01/25/2012,2:48 PM   Maxcine Ham, SLP Student

## 2012-01-25 NOTE — Progress Notes (Signed)
Physical Therapy Treatment Patient Details Name: VINETTE CRITES MRN: [id number] DOB: [birth year] Today's Date: 01/25/2012  PT Assessment/Plan  PT - Assessment/Plan Comments on Treatment Session: Pt continues to be self-limiting but once move actually did progress activity to at least stand prolonged at sink to wash hair. Pt in chair with SLP end of session and NT aware of pt position and desire to have assist back to bed when called but to be up at least 30 min. Pt performed own pericare today. PT Plan: Discharge plan remains appropriate PT Goals  Acute Rehab PT Goals Pt will go Sit to Stand: with modified independence PT Goal: Sit to Stand - Progress: Updated due to goal met Pt will go Stand to Sit: with modified independence PT Goal: Stand to Sit - Progress: Updated due to goals met PT Goal: Ambulate - Progress: Progressing toward goal  PT Treatment Precautions/Restrictions  Precautions Precautions: Fall Precaution Comments: PEG Restrictions Weight Bearing Restrictions: No Mobility (including Balance) Bed Mobility Left Sidelying to Sit: 6: Modified independent (Device/Increase time);With rails Sitting - Scoot to Edge of Bed: 6: Modified independent (Device/Increase time) Transfers Sit to Stand: 5: Supervision;From bed;From chair/3-in-1 Stand to Sit: To chair/3-in-1;5: Supervision Stand to Sit Details: supervision for safety and lines Stand Pivot Transfers: 5: Supervision Stand Pivot Transfer Details (indicate cue type and reason): bed to chair to [facility] Ambulation/Gait Ambulation/Gait Assistance: 5: Supervision Ambulation Distance (Feet): 10 Feet Assistive device: Other (Comment) (pt holding onto objects in room chair to sink and back) Gait Pattern: Decreased stride length  Static Standing Balance Static Standing - Balance Support: Left upper extremity supported Static Standing - Level of Assistance: 5: Stand by assistance Static Standing - Comment/# of Minutes: 10. Pt stood  at sink to wash hair and face with assist Exercise  General Exercises - Lower Extremity Heel Slides: AROM;10 reps;Both;Supine Hip ABduction/ADduction: AROM;Both;10 reps;Supine Toe Raises: AROM;Both;10 reps;Supine Heel Raises: AROM;Both;10 reps;Supine End of Session PT - End of Session Activity Tolerance: Patient tolerated treatment well Patient left: in chair;with call bell in reach Nurse Communication: Mobility status for transfers;Mobility status for ambulation General Behavior During Session: [facility] for tasks performed Cognition: [facility] for tasks performed  Delorse Lek 01/25/2012, 10:28 AM Toney Sang, PT [phone]

## 2012-01-26 ENCOUNTER — Inpatient Hospital Stay (HOSPITAL_COMMUNITY): Payer: Medicare Other

## 2012-01-26 LAB — CLOSTRIDIUM DIFFICILE TOXIN

## 2012-01-26 MED ORDER — LIDOCAINE HCL (PF) 1 % IJ SOLN
2.0000 mL | Freq: Once | INTRAMUSCULAR | Status: DC
  Filled 2012-01-26 (×3): qty 2

## 2012-01-26 MED ORDER — MAGIC MOUTHWASH
5.0000 mL | Freq: Three times a day (TID) | ORAL | Status: DC | PRN
  Administered 2012-01-26 (×2): 5 mL via ORAL
  Filled 2012-01-26 (×2): qty 5

## 2012-01-26 MED ORDER — METOCLOPRAMIDE HCL 5 MG/5ML PO SOLN
10.0000 mg | Freq: Three times a day (TID) | ORAL | Status: DC
  Administered 2012-01-26 – 2012-01-27 (×3): 10 mg
  Filled 2012-01-26 (×6): qty 10

## 2012-01-26 NOTE — Progress Notes (Addendum)
                                                  Subjective: Stable. Stronger. Undergoing MBS. Hope to dc soon.complains of nausea. Also complains of fever and chills. Jejunostomy tube is not sewn in. Overall not feeling as well as yesterday Objective: Vital signs in last 24 hours: Temp:  [98.3 F (36.8 C)-98.8 F (37.1 C)] 98.3 F (36.8 C) (02/20 0504) Pulse Rate:  [111-120] 116  (02/20 0504) Cardiac Rhythm:  [-] Sinus tachycardia (02/20 0000) Resp:  [18] 18  (02/20 0504) BP: (155-170)/(76-94) 170/79 mmHg (02/20 0504) SpO2:  [91 %-93 %] 91 % (02/20 0504) Weight:  [66.225 kg (146 lb)] 66.225 kg (146 lb) (02/20 0504)  Hemodynamic parameters for last 24 hours:    Intake/Output from previous day: 02/19 0701 - 02/20 0700 In: 882.3 [P.O.:240; I.V.:200; IV Piggyback:422] Out: 750 [Urine:750] Intake/Output this shift: Total I/O In: 0  Out: 200 [Urine:200]  General appearance: alert Heart: regular rate and rhythm, S1, S2 normal, no murmur, click, rub or gallop Lungs: clear to auscultation bilaterally Abdomen: soft, non-tender; bowel sounds normal; no masses,  no organomegaly  Lab Results:  [facility] 01/25/12 0703  WBC 9.3  HGB 9.0*  HCT 29.7*  PLT 392   BMET:  Basename 01/25/12 0703  NA 136  K 3.7  CL 101  CO2 29  GLUCOSE 110*  BUN 10  CREATININE 0.69  CALCIUM 11.2*    PT/INR: No results found for this basename: LABPROT,INR in the last 72 hours ABG    Component Value Date/Time   PHART 7.374 12/21/2011 0400   HCO3 21.9 12/21/2011 0400   TCO2 23 12/21/2011 0400   ACIDBASEDEF 3.0* 12/21/2011 0400   O2SAT 95.0 12/21/2011 0400   CBG (last 3)  No results found for this basename: GLUCAP:3 in the last 72 hours  Assessment/Plan: S/P   Plan dc soon feeling better. Will recheck lab. Restart Reglan. Continue vancomycin.  LOS: 11 days    Lyndia Bury [name] 01/26/2012

## 2012-01-26 NOTE — Progress Notes (Signed)
                   [address]            [place] [address]          [phone]    Brief Procedure  After betadine prep #2 zero silk sutures placed to secure jejunostomy tube. Patient tolerated well.

## 2012-01-26 NOTE — Progress Notes (Addendum)
Nutrition Follow-up  Diet Order:  Dysphagia 1, Honey thick Patient is having fewer loose stools per MD notes. Reglan was stopped on 2/18. SLP saw patient on 2/19, recommended MBS.  MBS was completed today, from Diagnostic Radiology reports, appears patient will need dysphagia 2 diet with honey thick liquids, diet order has not yet been updated.  Today, per MD notes patient is not feeling as well, some nausea. Reglan was restarted. Plans are for D/C soon if feeling better.   Current TF is Jevity 1.2 running at goal rate of 60 ml/hr, this provides 1728 kcal and 80 gm protein  Meds: Scheduled Meds:   . buPROPion  75 mg Oral BID  . cycloSPORINE  1 drop Both Eyes BID  . enoxaparin  30 mg Subcutaneous QHS  . fentaNYL  25 mcg Intramuscular Once  . free water  200 mL Per Tube Q8H  . lidocaine  2 mL Subcutaneous Once  . lisinopril  10 mg Oral Daily  . LORazepam  0.5 mg Oral QHS  . metoCLOPramide  10 mg Per Tube TID AC  . metoprolol tartrate  12.5 mg Oral BID  . pantoprazole sodium  40 mg Per Tube Q1200  . tiZANidine  2 mg Per Tube BID  . vancomycin  125 mg Oral Q6H  . DISCONTD: pantoprazole (PROTONIX) IV  40 mg Intravenous QHS   Continuous Infusions:   . sodium chloride 10 mL/hr (01/24/12 1016)  . feeding supplement (JEVITY 1.2) 1,000 mL (01/25/12 2324)   PRN Meds:.acetaminophen (TYLENOL) oral liquid 160 mg/5 mL, diphenhydrAMINE, diphenhydrAMINE, food thickener, HYDROcodone-acetaminophen, LORazepam, ondansetron (ZOFRAN) IV, oxyCODONE-acetaminophen  Labs:  CMP     Component Value Date/Time   NA 136 01/25/2012 0703   K 3.7 01/25/2012 0703   CL 101 01/25/2012 0703   CO2 29 01/25/2012 0703   GLUCOSE 110* 01/25/2012 0703   BUN 10 01/25/2012 0703   CREATININE 0.69 01/25/2012 0703   CALCIUM 11.2* 01/25/2012 0703   PROT 6.7 01/25/2012 0703   ALBUMIN 2.9* 01/25/2012 0703   AST 14 01/25/2012 0703   ALT 6 01/25/2012 0703   ALKPHOS 68 01/25/2012 0703   BILITOT 0.2* 01/25/2012 0703   GFRNONAA 83*  01/25/2012 0703   GFRAA >90 01/25/2012 0703     Intake/Output Summary (Last 24 hours) at 01/26/12 1257 Last data filed at 01/26/12 0730  Gross per 24 hour  Intake  642.3 ml  Output    950 ml  Net -307.7 ml    Weight Status:  146 lbs, down 4 lbs since admission  Re-estimated needs:  Unchanged, 1700-1900 kcal and 75-85 gm protein  Nutrition Dx:  Inadequate enteral nutrition infusion, resolved  Swallowing difficulties related to s/p Partial esophagectomy as evidenced by MBS/bedside swallow evaluation   Goal: Patient will be able to safely tolerate PO intake  Intervention:   Recommend TF rate remains at 60 ml/hr until patient is able to provide adequate nutrition through PO intake  Monitor:  Diet changes, TF rate, PO intake, weights   Rudean Haskell Pager #:  [phone]

## 2012-01-26 NOTE — Progress Notes (Signed)
Patient is not eating her food when it comes. She states that she is nauseous and can't eat anything. Patient is getting zofran and reglan. Bernita Raisin, RN

## 2012-01-26 NOTE — Procedures (Signed)
Modified Barium Swallow Procedure Note Patient Details  Name: [name] MRN: [id number] Date of Birth: Jul 17, 1934  Today's Date: 01/26/2012 Time:  -     Past Medical History:  Past Medical History  Diagnosis Date  . Pernicious anemia   . Osteoarthritis   . Fibromyalgia   . Skin lesion     chronic calcific cutaneous lesions  . HX of multiple bleeding ulcers 09/01/2011  . Hypertension     takes Atenolol daily  . Emphysema   . Bronchitis     hx  . Adenocarcinoma of gastroesophageal junction   . Dizziness     d/t crystals in ears that "roll around"and can't get them out  . Back pain   . Osteoporosis   . Scoliosis   . Psoriasis   . H/O hiatal hernia   . GERD (gastroesophageal reflux disease)     takes Aciphex daily  . Gastric ulcer   . Constipation     since Chemo and Radiation;finished up in Nov 2012  . Diverticulosis   . History of colonic polyps   . Shingles     broke out 2wks ago;has been on meds and to finish up tomorrow  . Kidney stone 25+yrs ago  . Pernicious anemia   . Vitamin B deficiency    Past Surgical History:  Past Surgical History  Procedure Date  . Right hip replacement 2001  . Tonsillectomy     as a child  . Tubal ligation 1975  . Appendectomy 1975  . Cataract surgery 10+yrs ago  . Esophagogastroduodenoscopy   . Colonoscopy   . Extubation 12/20/2011       . Partial esophagectomy 12/20/2011    Procedure: ESOPHAGECTOMY PARTIAL;  Surgeon: Norton Blizzard, MD;  Location: [facility] OR;  Service: Thoracic;  Laterality: N/A;   HPI: Patient is a 78 year old Caucasian female who was found to have esophageal cancer Stage III (adenocarcinoma) . She underwent radiation and chemotherapy. She presented to [facility] to undergo a transhiatal esophagectomy 12/20/11, intubated but immediately extubated post surgery. Pt presented with s/s of aspiration with thin liquids s/p surgery, which were exacerbated by a chin tuck. Pt was readmitted from SNF 2/9 when her J tube  was dislodged. Pt continues to c/o coughing and now reports episodes of syncope with swallowing attempts, making her afraid to eat/drink. SLP consult was requested given continued s/s of aspiration.  Clinical Impression: Patient presents with a moderate sensory-motor oropharyngeal and cervical esophageal dysphagia. Swallowing function characterized by reduced laryngeal strength and closure nad reduced base of tongue retraction resulting in penetration and aspiration of liquids, which were cleared with cued cough, as well as decreased CP relaxation and pharyngeal residue post swallow (greatest at vallecula). Patient aspirated honey thick liquid residue after the swallow however a chin tuck with multiple dry swallows was effective at decreasing residue and increasing airway protection with no aspiration observed. Unfortunately, chin tuck ineffective to prevent aspiration of thinner consistencies. No aspiration observed with solid trials. Recommend dys 3 (mechanical soft) diet with honey thick liquids with strict aspiration precautions to decrease risk.   Recommendation/Prognosis  1. Dys 3 (mechanical soft) diet with honey thick liquids 2. Chin tuck with multiple swallows 3. Intermittent throat clear 4. Sit upright 30-60 min after meals 5. Medications crushed in puree  SLP Goals  SLP Swallowing Goals  Patient will consume recommended diet without observed clinical signs of aspiration with: Supervision/safety  Swallow Study Goal #1 - Progress: Not Met  Patient will  utilize recommended strategies during swallow to increase swallowing safety with: Supervision/safety  Swallow Study Goal #2 - Progress: Not met    Maxcine Ham 01/26/2012, 4:13 PM  Maxcine Ham, SLP Student

## 2012-01-27 ENCOUNTER — Other Ambulatory Visit: Payer: Self-pay

## 2012-01-27 LAB — CBC
HCT: 28.5 % — ABNORMAL LOW (ref 36.0–46.0)
MCHC: 30.5 g/dL (ref 30.0–36.0)
Platelets: 401 10*3/uL — ABNORMAL HIGH (ref 150–400)
RDW: 16.8 % — ABNORMAL HIGH (ref 11.5–15.5)

## 2012-01-27 LAB — BASIC METABOLIC PANEL
BUN: 12 mg/dL (ref 6–23)
Creatinine, Ser: 0.71 mg/dL (ref 0.50–1.10)
GFR calc Af Amer: >90 mL/min (ref >90)
GFR calc non Af Amer: 82 mL/min — ABNORMAL LOW (ref >90)
Potassium: 4 mEq/L (ref 3.5–5.1)

## 2012-01-27 MED ORDER — PROMETHAZINE HCL 6.25 MG/5ML PO SYRP
12.5000 mg | ORAL_SOLUTION | Freq: Four times a day (QID) | ORAL | Status: DC | PRN
  Administered 2012-01-27 – 2012-01-28 (×2): 12.5 mg
  Filled 2012-01-27 (×2): qty 10

## 2012-01-27 MED ORDER — PROMETHAZINE-PHENYLEPHRINE 6.25-5 MG/5ML PO SYRP
10.0000 mL | ORAL_SOLUTION | Freq: Four times a day (QID) | ORAL | Status: DC | PRN
  Filled 2012-01-27: qty 10

## 2012-01-27 MED ORDER — PROMETHAZINE HCL 6.25 MG/5ML PO SYRP
12.5000 mg | ORAL_SOLUTION | Freq: Two times a day (BID) | ORAL | Status: AC
  Administered 2012-01-27 – 2012-01-29 (×4): 12.5 mg
  Filled 2012-01-27 (×6): qty 10

## 2012-01-27 MED ORDER — NITROGLYCERIN 0.4 MG SL SUBL
SUBLINGUAL_TABLET | SUBLINGUAL | Status: AC
  Administered 2012-01-27: 0.4 mg
  Filled 2012-01-27: qty 25

## 2012-01-27 NOTE — Progress Notes (Signed)
Speech Pathology: Dysphagia Treatment Note  Patient was observed with : Mechanical Soft / Ground and Honey liquids.  Patient was noted to have s/s of aspiration : Yes:  Throat clear  Lung Sounds:  Clear, diminished Temperature: 98.2  Patient required: supervision cues to consistently follow precautions/strategies  Clinical Impression: Pt was seen by SLP at bedside for assessment of diet tolerance. Pt has not consumed any pos since MBS 2/20, however she independently demonstrated and verbalized her recommended strategies to SLP (chin tuck/head turn to left with multiple swallows). Pt consistently utilized strategies across trials under SLP supervision. PO intake was limited secondary to pt c/o nausea. One throat clear was noted immediately after first cup sip of honey thick liquid, however no other s/s of aspiration were observed. Pt education was provided by SLP regarding diet recommendation, strategies, and swallow precautions. Diet appears to remain appropriate with the continued use of compensatory strategies.  Recommendations:  1. Continue current diet 2. Chin tuck with head turn left, multiple swallows 3. Intermittent throat clear  Pain:   none Intervention Required:   No  Goals: progressing    Maxcine Ham, SLP Student

## 2012-01-27 NOTE — Progress Notes (Signed)
Pt with c/o chest pain 5/10 all across chest wall. Pt reports a history of chest wall pain for which she states she was taking atenolol to treat. VS: 161/90, 114 HR, 96% 2L O2 via [place]. EKG obtained. No signifcant changes noted. SL nitro given x 1.  HOB raised to 30 degrees with PEG tube feeding.  BP after first nitro 130/74. Pt states the chest pain has diminished some 4/10 and is more concentrated under the left breast now. Another SL nitro given. BP 136/78 after second nitro. Pt reports relief from chest wall pain after second nitro.  Pt requesting benadryl and vicodin for generalized fibromyalgia pain. Will continue to monitor closely. Hively, Avie Echevaria, RN

## 2012-01-27 NOTE — Progress Notes (Signed)
UR Completed.  [name] [phone] 01/27/2012  

## 2012-01-27 NOTE — Progress Notes (Signed)
                                                  Subjective: Patient complains of chronic nausea. Stomach not distended. Will stop reglan. Will switch to phenergan, Will switch to dysphagia 3 diet, Hope to dc soon.Chest pain is not cardiac related. Objective: Vital signs in last 24 hours: Temp:  [98.2 F (36.8 C)-98.4 F (36.9 C)] 98.2 F (36.8 C) (02/21 0541) Pulse Rate:  [111-122] 115  (02/21 0541) Cardiac Rhythm:  [-] Sinus tachycardia (02/21 0815) Resp:  [18-20] 18  (02/21 0541) BP: (147-172)/(83-91) 157/83 mmHg (02/21 0541) SpO2:  [92 %-96 %] 96 % (02/21 0541)  Hemodynamic parameters for last 24 hours:    Intake/Output from previous day: 02/20 0701 - 02/21 0700 In: 1471.8 [I.V.:351.8] Out: 1900 [Urine:1900] Intake/Output this shift: Total I/O In: -  Out: 200 [Urine:200]  General appearance: alert Heart: regular rate and rhythm, S1, S2 normal, no murmur, click, rub or gallop Lungs: clear to auscultation bilaterally Abdomen: soft, non-tender; bowel sounds normal; no masses,  no organomegaly  Lab Results:  [facility] 01/27/12 0540 01/25/12 0703  WBC 9.1 9.3  HGB 8.7* 9.0*  HCT 28.5* 29.7*  PLT 401* 392   BMET:  Basename 01/27/12 0540 01/25/12 0703  NA 136 136  K 4.0 3.7  CL 101 101  CO2 29 29  GLUCOSE 111* 110*  BUN 12 10  CREATININE 0.71 0.69  CALCIUM 10.8* 11.2*    PT/INR: No results found for this basename: LABPROT,INR in the last 72 hours ABG    Component Value Date/Time   PHART 7.374 12/21/2011 0400   HCO3 21.9 12/21/2011 0400   TCO2 23 12/21/2011 0400   ACIDBASEDEF 3.0* 12/21/2011 0400   O2SAT 95.0 12/21/2011 0400   CBG (last 3)  No results found for this basename: GLUCAP:3 in the last 72 hours  Assessment/Plan: S/P   try phenergan. Switch to dysphagia three diet.    LOS: 12 days    [name] 01/27/2012

## 2012-01-27 NOTE — Progress Notes (Signed)
Physical Therapy Treatment Patient Details Name: [name] MRN: [id number] DOB: [birth year] Today's Date: 01/27/2012  PT Assessment/Plan  PT - Assessment/Plan Comments on Treatment Session: Pt continues to be self-limiting but did actually walk in the hall today!! Dr.Burney present and aware and pt encouraged to continue HEP and ambulation as well as OOB throughout the day. Pt refused further activity. Will continue to follow to maxmize mobility PT Plan: Discharge plan remains appropriate;Frequency remains appropriate PT Goals  Acute Rehab PT Goals Pt will go Supine/Side to Sit: with modified independence PT Goal: Supine/Side to Sit - Progress: Met Pt will go Sit to Supine/Side: with modified independence PT Goal: Sit to Supine/Side - Progress: Met PT Goal: Sit to Stand - Progress: Met PT Goal: Stand to Sit - Progress: Met Pt will Ambulate: with modified independence;with least restrictive assistive device;>150 feet PT Goal: Ambulate - Progress: Updated due to goal met  PT Treatment Precautions/Restrictions  Precautions Precautions: Fall Precaution Comments: PEG Restrictions Weight Bearing Restrictions: No Mobility (including Balance) Bed Mobility Rolling Left: 6: Modified independent (Device/Increase time) Sitting - Scoot to Edge of Bed: 6: Modified independent (Device/Increase time) Transfers Sit to Stand: 6: Modified independent (Device/Increase time);From bed Stand to Sit: 6: Modified independent (Device/Increase time);To chair/3-in-1 Ambulation/Gait Ambulation/Gait Assistance: 5: Supervision Ambulation/Gait Assistance Details (indicate cue type and reason): supervision for lines and cueing for encouragement Ambulation Distance (Feet): 150 Feet Assistive device: Rolling walker Gait Pattern: Within Functional Limits;Trunk flexed Stairs: No  Posture/Postural Control Posture/Postural Control: No significant limitations Exercise  General Exercises - Lower  Extremity Long Arc Quad: AROM;Both;10 reps;Seated Hip ABduction/ADduction: AROM;Both;10 reps;Seated Hip Flexion/Marching: AROM;Both;10 reps;Seated Toe Raises: AROM;Both;10 reps;Seated Heel Raises: AROM;Both;10 reps;Seated End of Session PT - End of Session Activity Tolerance: Patient tolerated treatment well Patient left: in chair;with call bell in reach Nurse Communication: Mobility status for transfers;Mobility status for ambulation General Behavior During Session: [facility] for tasks performed Cognition: [facility] for tasks performed  Delorse Lek 01/27/2012, 9:48 AM Toney Sang, PT [phone]

## 2012-01-27 NOTE — Progress Notes (Signed)
                    [address]            [place] [address]          [phone]          Subjective: Very nauseated, has not been able to eat since diet resumed.  Denies vomiting.  Frequency of stools overnight seems to have increased.  C/o "chest wall pain" last pm, relieved after pain Rx and NTG.   Objective: Vital signs in last 24 hours: Patient Vitals for the past 24 hrs:  BP Temp Temp src Pulse Resp SpO2  01/27/12 0541 157/83 mmHg 98.2 F (36.8 C) - 115  18  96 %  01/26/12 2053 147/88 mmHg 98.4 F (36.9 C) Oral 122  20  95 %  01/26/12 1423 172/91 mmHg 98.4 F (36.9 C) Oral 111  18  92 %   Current Weight  01/26/12 66.225 kg (146 lb)     Intake/Output from previous day: 02/20 0701 - 02/21 0700 In: 1471.8 [I.V.:351.8] Out: 1900 [Urine:1900]    PHYSICAL EXAM:  Heart: RRR Lungs: clear Wound: clean and dry, J tube site stable Abdomen: soft, diffuse epigastric tenderness, not distended, +BS  Lab Results: CBC: Basename 01/27/12 0540 01/25/12 0703  WBC 9.1 9.3  HGB 8.7* 9.0*  HCT 28.5* 29.7*  PLT 401* 392   BMET:  Basename 01/27/12 0540 01/25/12 0703  NA 136 136  K 4.0 3.7  CL 101 101  CO2 29 29  GLUCOSE 111* 110*  BUN 12 10  CREATININE 0.71 0.69  CALCIUM 10.8* 11.2*    PT/INR: No results found for this basename: LABPROT,INR in the last 72 hours   Assessment/Plan: C. Diff- Vanc D#10.  Still having frequent loose stools.  ?reculture stool.  Wonder if some of her diarrhea is related to tube feeds vs persistent C.diff MBS performed yesterday and speech path recommends D3 diet with honey thickened liquids.  Will advance diet and monitor swallowing. Nausea- restarted on Reglan.  Continue to monitor. Disp- back to SNF when medically stable.   LOS: 12 days    Breyton Vanscyoc H 01/27/2012

## 2012-01-28 LAB — GLUCOSE, CAPILLARY: Glucose-Capillary: 107 mg/dL — ABNORMAL HIGH (ref 70–99)

## 2012-01-28 MED ORDER — FERROUS SULFATE 75 (15 FE) MG/ML PO SOLN
300.0000 mg | Freq: Two times a day (BID) | ORAL | Status: DC
  Administered 2012-01-28: 300 mg
  Filled 2012-01-28 (×4): qty 20

## 2012-01-28 MED ORDER — NITROGLYCERIN 0.4 MG SL SUBL
SUBLINGUAL_TABLET | SUBLINGUAL | Status: AC
  Administered 2012-01-28: 0.4 mg via SUBLINGUAL
  Filled 2012-01-28: qty 25

## 2012-01-28 MED ORDER — FERROUS SULFATE 300 (60 FE) MG/5ML PO SYRP
300.0000 mg | ORAL_SOLUTION | Freq: Two times a day (BID) | ORAL | Status: DC
  Administered 2012-01-28: 300 mg via ORAL
  Filled 2012-01-28 (×3): qty 5

## 2012-01-28 NOTE — Progress Notes (Signed)
Patient complained to the nurse about chest pain. Patient described the pain as a crushing pain in the middle of her chest. Nurse administered 1 nitro sublingually, and 15 minutes later administered a second dose. Patient articulated that her pain had subsided a little. Patient later articulated to the nurse that she believed the pain was anxiety related and requested an ativan. Nurse administered 0.25 mg of Ativan to the patient. Patient informed the nurse that the pain had been elevated. [name]

## 2012-01-28 NOTE — Progress Notes (Signed)
                                                  Subjective: The patient's nausea was better today. She still complains of a weakness. She still was not taking a lot of oral intake. I instructed her to continue with the speech therapy recommendations. I plan to discharge her on Monday plan to continue, same therapy including tube feedings.  Objective: Vital signs in last 24 hours: Temp:  [97.5 F (36.4 C)-98.1 F (36.7 C)] 98.1 F (36.7 C) (02/22 0306) Pulse Rate:  [115-128] 120  (02/22 0306) Cardiac Rhythm:  [-] Sinus tachycardia (02/21 2200) Resp:  [18] 18  (02/22 0306) BP: (143-169)/(80-94) 143/82 mmHg (02/22 0306) SpO2:  [91 %-92 %] 92 % (02/22 0306)  Hemodynamic parameters for last 24 hours:    Intake/Output from previous day: 02/21 0701 - 02/22 0700 In: -  Out: 701 [Urine:700; Stool:1] Intake/Output this shift:    General appearance: alert Lungs: clear to auscultation bilaterally Abdomen: soft, non-tender; bowel sounds normal; no masses,  no organomegaly  Lab Results:  [facility] 01/27/12 0540  WBC 9.1  HGB 8.7*  HCT 28.5*  PLT 401*   BMET:  Basename 01/27/12 0540  NA 136  K 4.0  CL 101  CO2 29  GLUCOSE 111*  BUN 12  CREATININE 0.71  CALCIUM 10.8*    PT/INR: No results found for this basename: LABPROT,INR in the last 72 hours ABG    Component Value Date/Time   PHART 7.374 12/21/2011 0400   HCO3 21.9 12/21/2011 0400   TCO2 23 12/21/2011 0400   ACIDBASEDEF 3.0* 12/21/2011 0400   O2SAT 95.0 12/21/2011 0400   CBG (last 3)  No results found for this basename: GLUCAP:3 in the last 72 hours  Assessment/Plan: S/P   Start iron continue tube feedings continue same nausea medication   LOS: 13 days    Lilja Soland [name] 01/28/2012

## 2012-01-28 NOTE — Progress Notes (Signed)
CSW advised facility of anticipated d/c Monday. CSW will continue to follow.  Baxter Flattery, MSW [phone]

## 2012-01-29 MED ORDER — FERROUS SULFATE 300 (60 FE) MG/5ML PO SYRP
300.0000 mg | ORAL_SOLUTION | Freq: Two times a day (BID) | ORAL | Status: DC
Start: 2012-01-29 — End: 2012-01-31
  Administered 2012-01-29 – 2012-01-31 (×5): 300 mg
  Filled 2012-01-29 (×6): qty 5

## 2012-01-29 MED ORDER — FLUCONAZOLE 40 MG/ML PO SUSR
100.0000 mg | Freq: Every day | ORAL | Status: DC
  Administered 2012-01-29 – 2012-01-31 (×3): 100 mg
  Filled 2012-01-29 (×3): qty 2.5

## 2012-01-29 MED ORDER — POTASSIUM CHLORIDE CRYS ER 20 MEQ PO TBCR
EXTENDED_RELEASE_TABLET | ORAL | Status: AC
  Administered 2012-01-29: 20 meq
  Filled 2012-01-29: qty 1

## 2012-01-29 NOTE — Progress Notes (Signed)
                                                  Subjective: The patient complains of vaginal itching. Eating better with less nausea. Will dc oxycodone. Will start Diflucan. Looks better.   Objective: Vital signs in last 24 hours: Temp:  [97.9 F (36.6 C)-98.5 F (36.9 C)] 98.5 F (36.9 C) (02/23 0527) Pulse Rate:  [104-123] 116  (02/23 0527) Cardiac Rhythm:  [-] Sinus tachycardia (02/22 2000) Resp:  [18-19] 19  (02/23 0527) BP: (128-143)/(76-80) 143/80 mmHg (02/23 0527) SpO2:  [91 %-93 %] 93 % (02/23 0527)  Hemodynamic parameters for last 24 hours:    Intake/Output from previous day: 02/22 0701 - 02/23 0700 In: -  Out: 1 [Stool:1] Intake/Output this shift:    General appearance: alert Lungs: clear to auscultation bilaterally Abdomen: soft, non-tender; bowel sounds normal; no masses,  no organomegaly  Lab Results:  [facility] 01/27/12 0540  WBC 9.1  HGB 8.7*  HCT 28.5*  PLT 401*   BMET:  Basename 01/27/12 0540  NA 136  K 4.0  CL 101  CO2 29  GLUCOSE 111*  BUN 12  CREATININE 0.71  CALCIUM 10.8*    PT/INR: No results found for this basename: LABPROT,INR in the last 72 hours ABG    Component Value Date/Time   PHART 7.374 12/21/2011 0400   HCO3 21.9 12/21/2011 0400   TCO2 23 12/21/2011 0400   ACIDBASEDEF 3.0* 12/21/2011 0400   O2SAT 95.0 12/21/2011 0400   CBG (last 3)   Basename 01/28/12 2113  GLUCAP 107*    Assessment/Plan: S/P   DC oxycodone Start diflucan   LOS: 14 days    Reshunda Strider [name] 01/29/2012

## 2012-01-30 ENCOUNTER — Inpatient Hospital Stay (HOSPITAL_COMMUNITY): Payer: Medicare Other

## 2012-01-30 LAB — BASIC METABOLIC PANEL
Calcium: 10.9 mg/dL — ABNORMAL HIGH (ref 8.4–10.5)
Creatinine, Ser: 0.64 mg/dL (ref 0.50–1.10)
GFR calc Af Amer: >90 mL/min (ref >90)
GFR calc non Af Amer: 85 mL/min — ABNORMAL LOW (ref >90)

## 2012-01-30 LAB — CBC
MCH: 26.9 pg (ref 26.0–34.0)
MCV: 87.9 fL (ref 78.0–100.0)
Platelets: 403 10*3/uL — ABNORMAL HIGH (ref 150–400)
RDW: 17.1 % — ABNORMAL HIGH (ref 11.5–15.5)

## 2012-01-30 LAB — URINE CULTURE
Colony Count: NO GROWTH
Culture  Setup Time: 201302240231

## 2012-01-30 MED ORDER — FERROUS SULFATE 300 (60 FE) MG/5ML PO SYRP: 300.0000 mg | ORAL_SOLUTION | Freq: Two times a day (BID) | ORAL | Status: DC

## 2012-01-30 MED ORDER — LISINOPRIL 10 MG PO TABS: 10.0000 mg | ORAL_TABLET | Freq: Every day | ORAL | Status: DC

## 2012-01-30 MED ORDER — FREE WATER: 200.0000 mL | Freq: Three times a day (TID) | Status: DC

## 2012-01-30 MED ORDER — VANCOMYCIN 50 MG/ML ORAL SOLUTION: 125.0000 mg | Freq: Four times a day (QID) | ORAL | Status: DC

## 2012-01-30 MED ORDER — ATENOLOL 25 MG PO TABS: 12.5000 mg | ORAL_TABLET | Freq: Two times a day (BID) | ORAL | Status: DC

## 2012-01-30 MED ORDER — PROMETHAZINE HCL 6.25 MG/5ML PO SYRP: 12.5000 mg | ORAL_SOLUTION | Freq: Four times a day (QID) | ORAL | Status: DC | PRN

## 2012-01-30 MED ORDER — ATENOLOL 12.5 MG HALF TABLET
12.5000 mg | ORAL_TABLET | Freq: Two times a day (BID) | ORAL | Status: DC
  Administered 2012-01-30 – 2012-01-31 (×3): 12.5 mg via ORAL
  Filled 2012-01-30 (×4): qty 1

## 2012-01-30 MED ORDER — BUPROPION HCL 75 MG PO TABS: 75.0000 mg | ORAL_TABLET | Freq: Two times a day (BID) | ORAL | Status: DC

## 2012-01-30 MED ORDER — HYDROCODONE-ACETAMINOPHEN 7.5-500 MG/15ML PO SOLN: 10.0000 mL | ORAL | Status: AC | PRN

## 2012-01-30 MED ORDER — FLUCONAZOLE 40 MG/ML PO SUSR: 100.0000 mg | Freq: Every day | ORAL | Status: AC

## 2012-01-30 MED ORDER — JEVITY 1.2 CAL PO LIQD: 1000.0000 mL | ORAL | Status: DC

## 2012-01-30 NOTE — Discharge Summary (Signed)
Physician Discharge Summary  Patient ID: OSHA RANE MRN: [id number] DOB/AGE: [birth year] 78 y.o.  Admit date: 01/15/2012 Discharge date: 01/31/2012  Admission Diagnoses: 1.S/p transhiatal esophagectomy for adenocarcinoma  2.Accidental removal of feeding jejunotomy 3.History of pernicious anemia 4.History of OA 5.History of fibromyalgia 6.History of hypertension 7.History of emphysema 8.History of GERD 9.History of gastric ulcer 10.History of shingles  Discharge Diagnoses:  1.S/p transhiatal esophagectomy for adenocarcinoma  2.Accidental removal of feeding jejunotomy 3.History of pernicious anemia 4.History of OA 5.History of fibromyalgia 6.History of hypertension 7.History of emphysema 8.History of GERD 9.History of gastric ulcer 10.History of shingles 11.AV block 12.C Difficile 13.Depression   Procedure (s):  Jejunostomy tube replacement by IR on 01/15/2012  History of Presenting Illness: This is a 78 year old Caucasian female who is s/p transhiatal esophagogastrectomy, jejunostomy, and  Pyloroplasty by Dr. Edwyna Shell on 1/14/213 for stage IIIA adenocarcinoma of the esophagus  (status post radiation and chemotherapy).She was admitted to the emergency department on 01/15/2012 following accidental removal of jejunostomy tube in the nursing facility. She does have somewhat chronic loose stools that she relates to tube feedings and she also has a chronic cough.   Brief Hospital Course:  Anette Guarneri from interventional radiology placed the feeding jejunostomy onto 01/15/12 without difficulty. Placement was then confirmed to be correct and was able to be used without difficulty. She was restarted on her tube feedings. She then developed loose stools. C. difficile was positive. She was initially started on Flagyl; however, the this was then discontinued and she was started on vancomycin improvement of symptoms. She then developed episodes of bradycardia and what appeared to be a  transient AV block with presyncope that was associated with eating coughing. An EPS consultation was obtained with Dr. Johney Frame. It was felt thatt he most likely origin was vagal in nature. N.p.o. was continued and her metoprolol,l whichs he was on prior to her admission, was stopped. She then subsequent became tachycardic and hypertensive. She was started on low-dose Lisinopril through her J-tube. She continued to have intermittent nausea (no emesis or abdominal pain)which gradually did improve with Phenergan. He then underwent a modified barium swallow on 01/26/2012. Recommendations included a dysphasia 3) mechanical soft diet with honey thick liquids, chin up with multiple swallows, sit upright 30-60 minutes after meals, and medications crushed in puree. The patient had continued nausea and was unable to eat. She was restarted on Reglan, which was later stopped. She was continued on tube feedings.She then had complaints of chest wall pain.She was seen and evaluated by Dr. Edwyna Shell and this was not felt to be cardiac related. She then developed a vaginal itching. She was given Diflucan which will be continued for a total of 5 days.She had no further AV block episodes and was restarted on atenolol. Right she remains afebrile, in an empty stable, and pinning around evaluation, she surgically stable for discharge to the SNF on 01/31/2012.   Filed Vitals:   01/30/12 0453  BP: 124/76  Pulse: 117  Temp: 98.8 F (37.1 C)  Resp: 21     Latest Vital Signs: Blood pressure 124/76, pulse 117, temperature 98.8 F (37.1 C), temperature source Oral, resp. rate 21, height 5\' 4"  (1.626 m), weight 146 lb (66.225 kg), SpO2 93.00%.  Physical Exam: General appearance: alert  Lungs: clear to auscultation bilaterally  Abdomen: soft, non-tender; bowel sounds normal; no masses, no organomegaly   Discharge Condition:Stable  Recent laboratory studies:  Lab Results  Component Value Date   WBC 8.3 01/30/2012  HGB 8.7*  01/30/2012   HCT 28.4* 01/30/2012   MCV 87.9 01/30/2012   PLT 403* 01/30/2012   Lab Results  Component Value Date   NA 139 01/30/2012   K 4.4 01/30/2012   CL 104 01/30/2012   CO2 25 01/30/2012   CREATININE 0.64 01/30/2012   GLUCOSE 116* 01/30/2012    Diagnostic Studies: Dg Chest 2 View  01/30/2012  *RADIOLOGY REPORT*  Clinical Data: Chest wall pain  CHEST - 2 VIEW  Comparison: 01/24/2029  Findings: Stable cardiomegaly. Stable left base density.  No pneumothorax.  Small bilateral pleural effusions not significantly changed.  Stable thoracic spine.  IMPRESSION: Stable.  No pneumothorax.  Stable pleural effusions.  Original Report Authenticated By: Donavan Burnet, M.D.    Discharge Medications: Medication List  As of 01/30/2012 10:33 AM   STOP taking these medications         metoprolol tartrate 25 mg/10 mL Susp         TAKE these medications         atenolol 25 MG tablet   Commonly known as: TENORMIN   Place 0.5 tablets (12.5 mg total) into feeding tube 2 (two) times daily.      buPROPion 75 MG tablet   Commonly known as: WELLBUTRIN   Place 1 tablet (75 mg total) into feeding tube 2 (two) times daily.      cycloSPORINE 0.05 % ophthalmic emulsion   Commonly known as: RESTASIS   Place 1 drop into both eyes 2 (two) times daily.      diphenhydrAMINE 12.5 MG/5ML elixir   Commonly known as: BENADRYL   Place 25 mg into feeding tube every 6 (six) hours as needed. For itching or sleep      feeding supplement (PIVOT 1.5 CAL) Liqd   Place 1,000 mLs into feeding tube continuous. Continous 5pm-7am      feeding supplement (PIVOT 1.5 CAL) Liqd   Place 1,000 mLs into feeding tube daily.      feeding supplement (JEVITY 1.2) Liqd   Place 1,000 mLs into feeding tube continuous.      ferrous sulfate 300 (60 FE) MG/5ML syrup   Place 5 mLs (300 mg total) into feeding tube 2 (two) times daily.      fluconazole 40 MG/ML suspension   Commonly known as: DIFLUCAN   Place 2.5 mLs (100 mg total) into  feeding tube daily. For 2 days then stop.      free water Soln   Place 200 mLs into feeding tube every 8 (eight) hours.      HYDROcodone-acetaminophen 7.5-500 MG/15ML solution   Commonly known as: LORTAB   Place 10 mLs into feeding tube every 4 (four) hours as needed for pain.      lisinopril 10 MG tablet   Commonly known as: PRINIVIL,ZESTRIL   Place 1 tablet (10 mg total) into feeding tube daily.      LORazepam 2 MG/ML concentrated solution   Commonly known as: ATIVAN   Place 0.3 mg into feeding tube every 6 (six) hours as needed. For for anxiety      metoCLOPramide 5 MG/5ML solution   Commonly known as: REGLAN   Place 10 mg into feeding tube 2 (two) times daily.      PRESCRIPTION MEDICATION   Place 75 mg into feeding tube 2 (two) times daily. Metoprolol Tartrate 25mg /60ml Syrup      PRESCRIPTION MEDICATION   Place 15 mLs into feeding tube every 6 (six) hours as needed. Hydrocodone 7.5/500mg   Liquid. For pain      promethazine 6.25 MG/5ML syrup   Commonly known as: PHENERGAN   Place 10 mLs (12.5 mg total) into feeding tube every 6 (six) hours as needed for nausea.      tiZANidine 4 MG tablet   Commonly known as: ZANAFLEX   Place 2 mg into feeding tube 2 (two) times daily.      vancomycin 50 mg/mL oral solution   Commonly known as: VANCOCIN   Place 2.5 mLs (125 mg total) into feeding tube every 6 (six) hours.            Follow Up Appointments: Follow-up Information    Call FRIED, Doris Cheadle, MD. (As needed)       Follow up with Cameron Proud, MD. (As needed, or as previously arranged)    Contact information:   [address] [place] [address] [phone]          Signed: Doree Fudge MPA-C 01/30/2012, 10:33 AM

## 2012-01-30 NOTE — Progress Notes (Signed)
                                                  Subjective: 2 aten Still complains of chest wall pain. We will switch back to Atenolol  from Lopressor. He had better and feeling better plan discharge in a.m.  Objective: Vital signs in last 24 hours: Temp:  [98 F (36.7 C)-98.8 F (37.1 C)] 98.8 F (37.1 C) (02/24 0453) Pulse Rate:  [110-117] 117  (02/24 0453) Cardiac Rhythm:  [-]  Resp:  [18-21] 21  (02/24 0453) BP: (124-166)/(76-89) 124/76 mmHg (02/24 0453) SpO2:  [92 %-93 %] 93 % (02/24 0453)  Hemodynamic parameters for last 24 hours:    Intake/Output from previous day: 02/23 0701 - 02/24 0700 In: 560 [P.O.:360] Out: 950 [Urine:950] Intake/Output this shift:    General appearance: alert Lungs: clear to auscultation bilaterally Abdomen: soft, non-tender; bowel sounds normal; no masses,  no organomegaly  Lab Results:  [facility] 01/30/12 0620  WBC 8.3  HGB 8.7*  HCT 28.4*  PLT 403*   BMET:  Basename 01/30/12 0620  NA 139  K 4.4  CL 104  CO2 25  GLUCOSE 116*  BUN 15  CREATININE 0.64  CALCIUM 10.9*    PT/INR: No results found for this basename: LABPROT,INR in the last 72 hours ABG    Component Value Date/Time   PHART 7.374 12/21/2011 0400   HCO3 21.9 12/21/2011 0400   TCO2 23 12/21/2011 0400   ACIDBASEDEF 3.0* 12/21/2011 0400   O2SAT 95.0 12/21/2011 0400   CBG (last 3)   Basename 01/28/12 2113  GLUCAP 107*    Assessment/Plan: S/P   Surgeon stable chest x-ray is stable laboratory is stable   LOS: 15 days    Alejo Beamer [name] 01/30/2012

## 2012-01-31 LAB — CLOSTRIDIUM DIFFICILE CULTURE-FECAL

## 2012-01-31 NOTE — Progress Notes (Signed)
Physical Therapy Treatment Patient Details Name: [name] MRN: [id number] DOB: 04-23-1934 Today's Date: 01/31/2012  PT Assessment/Plan  PT - Assessment/Plan Comments on Treatment Session: Pt admitted s/p transhiatal esophagectomy for adenocarcinoma.  Pt still self limiting with mobility, but willing today to review HEP in order to continue to increase strength.  Will follow. PT Plan: Discharge plan remains appropriate;Frequency remains appropriate PT Frequency: Min 3X/week Follow Up Recommendations: Skilled nursing facility Equipment Recommended: Defer to next venue PT Goals  Acute Rehab PT Goals PT Goal Formulation: With patient Time For Goal Achievement: 2 weeks Pt will Perform Home Exercise Program: Independently PT Goal: Perform Home Exercise Program - Progress: Goal set today  PT Treatment Precautions/Restrictions  Precautions Precautions: Fall Precaution Comments: PEG Required Braces or Orthoses: No Restrictions Weight Bearing Restrictions: No Pain Pt with c/o left side chest pain.  RN made aware.  Pt unable to rate pain this am. Mobility (including Balance) Bed Mobility Bed Mobility: No Rolling Left: Not tested (comment) Left Sidelying to Sit: Not tested (comment) Supine to Sit: Not tested (comment) Sitting - Scoot to Edge of Bed: Not tested (comment) Sit to Supine: Not Tested (comment) Transfers Transfers: No Sit to Stand: Not tested (comment) Stand to Sit: Not tested (comment) Stand Pivot Transfers: Not tested (comment) Ambulation/Gait Ambulation/Gait: No Ambulation/Gait Assistance: Not tested (comment) Stairs: No Wheelchair Mobility Wheelchair Mobility: No  Posture/Postural Control Posture/Postural Control: No significant limitations Balance Balance Assessed: No Exercise  General Exercises - Lower Extremity Ankle Circles/Pumps: AROM;5 reps;10 reps;Supine Quad Sets: AROM;Both;10 reps;Supine Short Arc Quad: AROM;Both;10 reps;Supine Heel Slides:  AROM;Both;10 reps;Supine Hip ABduction/ADduction: AROM;Both;10 reps;Supine Straight Leg Raises: AROM;Both;10 reps;Supine End of Session PT - End of Session Activity Tolerance: Patient tolerated treatment well (Limited by c/o nausea and left side "chest wall" pain.) Patient left: in bed;with call bell in reach Nurse Communication: Other (comment) (Notified of chest pain and limited activity.) General Behavior During Session: [facility] for tasks performed Cognition: [facility] for tasks performed  Cephus Shelling 01/31/2012, 9:58 AM  01/31/2012 Cephus Shelling, PT, DPT [phone]

## 2012-01-31 NOTE — Progress Notes (Signed)
                                                  Subjective: Patient is stable. OK to DC to SNF. Objective: Vital signs in last 24 hours: Temp:  [98.5 F (36.9 C)-98.9 F (37.2 C)] 98.9 F (37.2 C) (02/25 0411) Pulse Rate:  [103-128] 128  (02/25 0411) Cardiac Rhythm:  [-] Sinus tachycardia (02/24 2100) Resp:  [18-20] 18  (02/25 0411) BP: (126-148)/(73-79) 148/79 mmHg (02/25 0411) SpO2:  [90 %-93 %] 93 % (02/25 0411)  Hemodynamic parameters for last 24 hours:    Intake/Output from previous day: 02/24 0701 - 02/25 0700 In: 180 [P.O.:180] Out: 301 [Urine:300; Stool:1] Intake/Output this shift:    General appearance: alert Lungs: clear to auscultation bilaterally Abdomen: soft, non-tender; bowel sounds normal; no masses,  no organomegaly  Lab Results:  [facility] 01/30/12 0620  WBC 8.3  HGB 8.7*  HCT 28.4*  PLT 403*   BMET:  Basename 01/30/12 0620  NA 139  K 4.4  CL 104  CO2 25  GLUCOSE 116*  BUN 15  CREATININE 0.64  CALCIUM 10.9*    PT/INR: No results found for this basename: LABPROT,INR in the last 72 hours ABG    Component Value Date/Time   PHART 7.374 12/21/2011 0400   HCO3 21.9 12/21/2011 0400   TCO2 23 12/21/2011 0400   ACIDBASEDEF 3.0* 12/21/2011 0400   O2SAT 95.0 12/21/2011 0400   CBG (last 3)   Basename 01/28/12 2113  GLUCAP 107*    Assessment/Plan: S/P   Plan for discharge: see discharge orders   LOS: 16 days    [name] [name] 01/31/2012

## 2012-01-31 NOTE — Progress Notes (Signed)
UR Completed.  [name] [phone] 01/31/2012  

## 2012-01-31 NOTE — Progress Notes (Signed)
Pt d/c to [facility] today. Packet placed in [place]. Pt son to complete paperwork at [facility] at present. Facility is ready to receive pt. RN advised. CSW will call [facility] for transport.  Baxter Flattery, MSW [phone]

## 2012-01-31 NOTE — Progress Notes (Signed)
   CARE MANAGEMENT NOTE 01/31/2012  Patient:  NYEEMAH, JENNETTE   Account Number:  [id number]  Date Initiated:  01/17/2012  Documentation initiated by:  Avie Arenas  Subjective/Objective Assessment:   cdif - jej out - post esophageal ca.  From SNF - [facility].     Action/Plan:   PTA, PT RESIDES AT [facility]; STATES PLANS TO RETURN THERE AT DC.   Anticipated DC Date:  01/21/2012   Anticipated DC Plan:  SKILLED NURSING FACILITY  In-house referral  Clinical Social Worker      DC Planning Services  CM consult      Choice offered to / List presented to:             Status of service:  Completed, signed off Medicare Important Message given?   (If response is "NO", the following Medicare IM given date fields will be blank) Date Medicare IM given:   Date Additional Medicare IM given:    Discharge Disposition:  SKILLED NURSING FACILITY  Per UR Regulation:  Reviewed for med. necessity/level of care/duration of stay  Comments:  01/31/12 Nickie Retort, BSN PT FOR DISCHARGE BACK TO [facility] TODAY, PER CSW ARRANGEMENTS. Phone #[phone]  01/17/12 Lavonne Kinderman,RN,BSN 1400 REFERRAL TO CSW TO FACILITATE RETURN TO SNF AT DC. Phone #[phone]

## 2012-02-01 LAB — CLOSTRIDIUM DIFFICILE TOXIN

## 2012-02-08 ENCOUNTER — Other Ambulatory Visit: Payer: Self-pay | Admitting: Thoracic Surgery

## 2012-02-08 DIAGNOSIS — C159 Malignant neoplasm of esophagus, unspecified: Secondary | ICD-10-CM

## 2012-02-09 ENCOUNTER — Encounter: Payer: Self-pay | Admitting: Thoracic Surgery

## 2012-02-09 ENCOUNTER — Ambulatory Visit (INDEPENDENT_AMBULATORY_CARE_PROVIDER_SITE_OTHER): Payer: Self-pay | Admitting: Thoracic Surgery

## 2012-02-09 ENCOUNTER — Ambulatory Visit
Admission: RE | Admit: 2012-02-09 | Discharge: 2012-02-09 | Disposition: A | Discharge status: Home / Self Care | Payer: Medicare Other | Source: Ambulatory Visit | Attending: Thoracic Surgery | Admitting: Thoracic Surgery

## 2012-02-09 VITALS — BP 130/80 | HR 98 | Resp 20 | Ht 63.0 in | Wt 142.0 lb

## 2012-02-09 DIAGNOSIS — C159 Malignant neoplasm of esophagus, unspecified: Secondary | ICD-10-CM

## 2012-02-09 DIAGNOSIS — Z09 Encounter for follow-up examination after completed treatment for conditions other than malignant neoplasm: Secondary | ICD-10-CM

## 2012-02-09 NOTE — Progress Notes (Signed)
HPI patient returns for followup today. Chest x-ray shows postoperative changes there is some mild dilatation of the stomach. She still complains of nausea and trouble eating. We will switch her her weight was 145 pounds. We will switch her from I. continuous feedings to 14 hours feedings at 60 cc an hour from 5 PM to 7 AM. I her jejunostomy site was leaking some material around the site of the sutures are in fact. But she'll probably will have to have this resected in the near future we redressed it. We will see her back again in 2 weeks and 2 weeks and check on her status. her iron has been stopped with a hematocrit of 31.  Current Outpatient Prescriptions  Medication Sig Dispense Refill  . atenolol (TENORMIN) 25 MG tablet Place 0.5 tablets (12.5 mg total) into feeding tube 2 (two) times daily.      Marland Kitchen buPROPion (WELLBUTRIN) 75 MG tablet Place 1 tablet (75 mg total) into feeding tube 2 (two) times daily.  60 tablet  1  . diphenhydrAMINE (BENADRYL) 12.5 MG/5ML elixir Place 25 mg into feeding tube every 6 (six) hours as needed. For itching or sleep      . fluconazole (DIFLUCAN) 40 MG/ML suspension Place 2.5 mLs (100 mg total) into feeding tube daily. For 2 days then stop.  35 mL    . HYDROcodone-acetaminophen (LORTAB) 7.5-500 MG/15ML solution Place 10 mLs into feeding tube every 4 (four) hours as needed for pain.  40 mL  0  . lisinopril (PRINIVIL,ZESTRIL) 10 MG tablet Place 1 tablet (10 mg total) into feeding tube daily.      Marland Kitchen LORazepam (ATIVAN) 2 MG/ML concentrated solution Place 0.3 mg into feeding tube every 6 (six) hours as needed. For for anxiety      . metoCLOPramide (REGLAN) 5 MG/5ML solution Place 10 mg into feeding tube 2 (two) times daily.      . Nutritional Supplements (FEEDING SUPPLEMENT, OSMOLITE 1.5 CAL,) LIQD Take 1,000 mLs by mouth continuous. 65 cc's/hour x 12 hours (6pm to 6 am) H2O flushes to 150 cc's every 4 hours      . PRESCRIPTION MEDICATION Place 15 mLs into feeding tube every 6  (six) hours as needed. Hydrocodone 7.5/500mg  Liquid. For pain      . tiZANidine (ZANAFLEX) 4 MG tablet Place 2 mg into feeding tube 2 (two) times daily.       . vancomycin (VANCOCIN) 50 mg/mL oral solution Place 2.5 mLs (125 mg total) into feeding tube every 6 (six) hours.      . Water For Irrigation, Sterile (FREE WATER) SOLN Place 200 mLs into feeding tube every 8 (eight) hours.      . cycloSPORINE (RESTASIS) 0.05 % ophthalmic emulsion Place 1 drop into both eyes 2 (two) times daily.           Review of Systems: Continue volume and nausea and eating. Brace is improved  Physical Exam lungs are clear to auscultation and percussion  Diagnostic Tests: Chest x-ray shows no effusion mild dilatation of her transthoracic stomach   Impression: Status post esophagogastrectomy with transhiatal reconstruction   Plan: Return in 2 weeks

## 2012-02-16 ENCOUNTER — Encounter (HOSPITAL_COMMUNITY): Payer: Self-pay | Admitting: Emergency Medicine

## 2012-02-16 ENCOUNTER — Emergency Department (HOSPITAL_COMMUNITY)
Admission: EM | Admit: 2012-02-16 | Discharge: 2012-02-16 | Disposition: A | Discharge status: Home / Self Care | Payer: Medicare Other | Attending: Emergency Medicine | Admitting: Emergency Medicine

## 2012-02-16 ENCOUNTER — Emergency Department (HOSPITAL_COMMUNITY): Payer: Medicare Other

## 2012-02-16 DIAGNOSIS — K219 Gastro-esophageal reflux disease without esophagitis: Secondary | ICD-10-CM | POA: Insufficient documentation

## 2012-02-16 DIAGNOSIS — J438 Other emphysema: Secondary | ICD-10-CM | POA: Insufficient documentation

## 2012-02-16 DIAGNOSIS — Z8501 Personal history of malignant neoplasm of esophagus: Secondary | ICD-10-CM | POA: Insufficient documentation

## 2012-02-16 DIAGNOSIS — I10 Essential (primary) hypertension: Secondary | ICD-10-CM | POA: Insufficient documentation

## 2012-02-16 DIAGNOSIS — M810 Age-related osteoporosis without current pathological fracture: Secondary | ICD-10-CM | POA: Insufficient documentation

## 2012-02-16 DIAGNOSIS — T85598A Other mechanical complication of other gastrointestinal prosthetic devices, implants and grafts, initial encounter: Secondary | ICD-10-CM

## 2012-02-16 DIAGNOSIS — Y849 Medical procedure, unspecified as the cause of abnormal reaction of the patient, or of later complication, without mention of misadventure at the time of the procedure: Secondary | ICD-10-CM | POA: Insufficient documentation

## 2012-02-16 DIAGNOSIS — M1990 Unspecified osteoarthritis, unspecified site: Secondary | ICD-10-CM | POA: Insufficient documentation

## 2012-02-16 DIAGNOSIS — K9429 Other complications of gastrostomy: Secondary | ICD-10-CM | POA: Insufficient documentation

## 2012-02-16 DIAGNOSIS — Z79899 Other long term (current) drug therapy: Secondary | ICD-10-CM | POA: Insufficient documentation

## 2012-02-16 MED ORDER — IOHEXOL 300 MG/ML  SOLN
50.0000 mL | Freq: Once | INTRAMUSCULAR | Status: AC | PRN
  Administered 2012-02-16: 10 mL via INTRAVENOUS

## 2012-02-16 NOTE — Discharge Instructions (Signed)
You may return to the ER if you have any other concerns.

## 2012-02-16 NOTE — ED Provider Notes (Signed)
History     CSN: [id number]  Arrival date & time 02/16/12  0442   First MD Initiated Contact with Patient 02/16/12 [phone]      No chief complaint on file.   (Consider location/radiation/quality/duration/timing/severity/associated sxs/prior treatment) HPI The patient presents with concerns over a feeding tube which fell out accidentally just prior to arrival.  She notes that she is otherwise been in her new usual state of health.  She notes no new fevers, chills, vomiting, diarrhea.  She is feeding tube dependent due to ongoing treatment for esophageal cancer.  Past Medical History  Diagnosis Date  . Pernicious anemia   . Osteoarthritis   . Fibromyalgia   . Skin lesion     chronic calcific cutaneous lesions  . HX of multiple bleeding ulcers 09/01/2011  . Hypertension     takes Atenolol daily  . Emphysema   . Bronchitis     hx  . Adenocarcinoma of gastroesophageal junction   . Dizziness     d/t crystals in ears that "roll around"and can't get them out  . Back pain   . Osteoporosis   . Scoliosis   . Psoriasis   . H/O hiatal hernia   . GERD (gastroesophageal reflux disease)     takes Aciphex daily  . Gastric ulcer   . Constipation     since Chemo and Radiation;finished up in Nov 2012  . Diverticulosis   . History of colonic polyps   . Shingles     broke out 2wks ago;has been on meds and to finish up tomorrow  . Kidney stone 25+yrs ago  . Pernicious anemia   . Vitamin B deficiency     Past Surgical History  Procedure Date  . Right hip replacement 2001  . Tonsillectomy     as a child  . Tubal ligation 1975  . Appendectomy 1975  . Cataract surgery 10+yrs ago  . Esophagogastroduodenoscopy   . Colonoscopy   . Extubation 12/20/2011       . Partial esophagectomy 12/20/2011    Procedure: ESOPHAGECTOMY PARTIAL;  Surgeon: Norton Blizzard, MD;  Location: [facility] OR;  Service: Thoracic;  Laterality: N/A;    Family History  Problem Relation Age of Onset  . Cancer Maternal  Grandmother     gynecologic  . Cancer Maternal Aunt     gynecologic  . Breast cancer Paternal Aunt   . Breast cancer Paternal Aunt   . Breast cancer Paternal Aunt   . Anesthesia problems Neg Hx   . Hypotension Neg Hx   . Malignant hyperthermia Neg Hx   . Pseudochol deficiency Neg Hx     History  Substance Use Topics  . Smoking status: Former Games developer  . Smokeless tobacco: Never Used   Comment: quit 42yrs ago  . Alcohol Use: No    OB History    Grav Para Term Preterm Abortions TAB SAB Ect Mult Living                  Review of Systems  Constitutional: Negative.   HENT: Negative.   Eyes: Negative.   Respiratory: Negative.  Negative for shortness of breath.   Cardiovascular: Negative for chest pain.  Gastrointestinal: Negative for nausea, vomiting, diarrhea and abdominal distention.  Genitourinary: Negative for difficulty urinating.  Musculoskeletal: Positive for arthralgias.  Neurological: Negative.   Hematological: Negative.   Psychiatric/Behavioral: Negative.     Allergies  Adhesive; Biaxin; Darvon; Epinephrine; Morphine and related; Oxycodone; Oxycontin; Sulfa antibiotics; and Vicodin  Home  Medications   Current Outpatient Rx  Name Route Sig Dispense Refill  . ATENOLOL 25 MG PO TABS Per Tube Place 0.5 tablets (12.5 mg total) into feeding tube 2 (two) times daily.    . BUPROPION HCL 75 MG PO TABS Per Tube Place 1 tablet (75 mg total) into feeding tube 2 (two) times daily. 60 tablet 1  . CYCLOSPORINE 0.05 % OP EMUL Both Eyes Place 1 drop into both eyes 2 (two) times daily.      Marland Kitchen DIPHENHYDRAMINE HCL 12.5 MG/5ML PO ELIX Per Tube Place 25 mg into feeding tube every 6 (six) hours as needed. For itching or sleep    . LISINOPRIL 10 MG PO TABS Per Tube Place 1 tablet (10 mg total) into feeding tube daily.    Marland Kitchen LORAZEPAM 2 MG/ML PO CONC Per Tube Place 0.3 mg into feeding tube every 6 (six) hours as needed. For for anxiety    . METOCLOPRAMIDE HCL 5 MG/5ML PO SOLN Per Tube  Place 10 mg into feeding tube 2 (two) times daily.    . OSMOLITE 1.5 CAL PO LIQD Oral Take 1,000 mLs by mouth continuous. 65 cc's/hour x 12 hours (6pm to 6 am) H2O flushes to 150 cc's every 4 hours    . PRESCRIPTION MEDICATION Per Tube Place 15 mLs into feeding tube every 6 (six) hours as needed. Hydrocodone 7.5/500mg  Liquid. For pain    . TIZANIDINE HCL 4 MG PO TABS Per Tube Place 2 mg into feeding tube 2 (two) times daily.     Marland Kitchen VANCOMYCIN 50 MG/ML ORAL SOLUTION Per Tube Place 2.5 mLs (125 mg total) into feeding tube every 6 (six) hours.    Marland Kitchen FREE WATER Per Tube Place 200 mLs into feeding tube every 8 (eight) hours.      BP 156/77  Pulse 94  Temp(Src) 98.8 F (37.1 C) (Oral)  Resp 20  SpO2 96%  Physical Exam  Constitutional: She is oriented to person, place, and time. She appears well-developed and well-nourished.  HENT:  Head: Normocephalic.  Mouth/Throat: Oropharynx is clear and moist.  Eyes: Conjunctivae are normal. Pupils are equal, round, and reactive to light.  Neck: Normal range of motion. Neck supple.  Cardiovascular: Normal rate and regular rhythm.   Pulmonary/Chest: Effort normal and breath sounds normal. She has no wheezes. She has no rales.  Abdominal: Soft. Bowel sounds are normal. There is no guarding.       Site of PEG with mild erythema around site  Musculoskeletal: Normal range of motion.  Neurological: She is alert and oriented to person, place, and time.  Skin: Skin is warm and dry.  Psychiatric: She has a normal mood and affect.    ED Course  Procedures (including critical care time)  Labs Reviewed - No data to display No results found.   No diagnosis found.    MDM  This elderly female with esophageal cancer now presents because her PEG tube fell out.  I tried to replace the tube, and after passage through the skin was unable to locate the orifice into the stomach.  The patient was sent to interventional radiology for definitive care.  Late  addendum: On review the patient had successful revision of her feeding tube interventional radiology and was discharged in stable condition.   Gerhard Munch, MD 02/17/12 [phone]

## 2012-02-16 NOTE — ED Notes (Signed)
Pt's feeding tube became dislodged when getting up this am. Denies pain or other discomfort.

## 2012-02-16 NOTE — ED Provider Notes (Signed)
Pt moved to CDU this morning to have feeding tube replaced by IR.  She has just returned.  No complaints/concerns.  Discharged home.    [name], [place] 02/16/12 [phone]

## 2012-02-16 NOTE — ED Notes (Signed)
Pt returned from procedure

## 2012-02-16 NOTE — ED Notes (Signed)
PT. REPORTS GASTRIC FEEDING TUBE ACCIDENTALLY PULLED OUT THIS MORNING AT 3 AM.

## 2012-02-16 NOTE — Procedures (Signed)
Successful fluoroscopic guided replacement of 16 Fr red Deviney jejunostomy tube.  No immediate post procedural complications.  The feeding tube is ready for immediate use.

## 2012-02-16 NOTE — ED Notes (Signed)
Pt transported to IR for tube placement

## 2012-02-17 NOTE — ED Provider Notes (Signed)
Please see my initial note.  I moved the patient into the CDU to await interventional radiology revision of her feeding tube.  This was accomplished and the patient was discharged.  Gerhard Munch, MD 02/17/12 [phone]

## 2012-02-18 ENCOUNTER — Telehealth (HOSPITAL_COMMUNITY): Payer: Self-pay

## 2012-02-23 ENCOUNTER — Ambulatory Visit: Payer: Medicare Other | Admitting: Thoracic Surgery

## 2012-02-28 ENCOUNTER — Other Ambulatory Visit: Payer: Self-pay | Admitting: Thoracic Surgery

## 2012-02-28 DIAGNOSIS — C159 Malignant neoplasm of esophagus, unspecified: Secondary | ICD-10-CM

## 2012-03-06 ENCOUNTER — Ambulatory Visit (INDEPENDENT_AMBULATORY_CARE_PROVIDER_SITE_OTHER): Payer: Self-pay | Admitting: Thoracic Surgery

## 2012-03-06 ENCOUNTER — Encounter: Payer: Self-pay | Admitting: Thoracic Surgery

## 2012-03-06 VITALS — BP 130/70 | HR 72 | Resp 20 | Ht 63.0 in | Wt 142.0 lb

## 2012-03-06 DIAGNOSIS — C159 Malignant neoplasm of esophagus, unspecified: Secondary | ICD-10-CM

## 2012-03-06 DIAGNOSIS — Z9889 Other specified postprocedural states: Secondary | ICD-10-CM

## 2012-03-06 NOTE — Progress Notes (Signed)
HPI patient returns today she is doing well. She had a really rough time but is now swallowing left jejunostomy tube was been removed. We did not okay the removal but she appears to be healing well without tube. Her choices also improved. She says she'll some mild problems with saliva positive for cough. We will continue the same treatment. We will see her back again in 6 weeks with a chest x-ray. She will eventually need to have hip replacement surgery.  Current Outpatient Prescriptions  Medication Sig Dispense Refill  . atenolol (TENORMIN) 25 MG tablet Take 12.5 mg by mouth 2 (two) times daily.      Marland Kitchen buPROPion (WELLBUTRIN) 75 MG tablet Take 75 mg by mouth 2 (two) times daily.      . cycloSPORINE (RESTASIS) 0.05 % ophthalmic emulsion Place 1 drop into both eyes 2 (two) times daily.        Marland Kitchen lisinopril (PRINIVIL,ZESTRIL) 10 MG tablet Take 10 mg by mouth daily.      Marland Kitchen LORazepam (ATIVAN) 0.5 MG tablet Take 0.5 mg by mouth every 8 (eight) hours.      . metoCLOPramide (REGLAN) 10 MG tablet Take 10 mg by mouth 4 (four) times daily.      Marland Kitchen DISCONTD: atenolol (TENORMIN) 25 MG tablet Place 0.5 tablets (12.5 mg total) into feeding tube 2 (two) times daily.      Marland Kitchen DISCONTD: buPROPion (WELLBUTRIN) 75 MG tablet Place 1 tablet (75 mg total) into feeding tube 2 (two) times daily.  60 tablet  1  . DISCONTD: lisinopril (PRINIVIL,ZESTRIL) 10 MG tablet Place 1 tablet (10 mg total) into feeding tube daily.      . diphenhydrAMINE (BENADRYL) 12.5 MG/5ML elixir Take 25 mg by mouth 4 (four) times daily as needed.      Marland Kitchen DISCONTD: diphenhydrAMINE (BENADRYL) 12.5 MG/5ML elixir Place 10 mLs (25 mg total) into feeding tube every 6 (six) hours as needed for itching or sleep (use with hydrocodone).  120 mL    . DISCONTD: diphenhydrAMINE (BENADRYL) 12.5 MG/5ML elixir Place 25 mg into feeding tube every 6 (six) hours as needed. For itching or sleep         Review of Systems: Improvement in dysphagia and hoarseness  Physical  Exam lungs are clear auscultation percussion.   Diagnostic Tests: None   Impression: Status post transhiatal esophagogastrectomy.   Plan: return in 6 weeks with chest x-ray

## 2012-03-30 ENCOUNTER — Other Ambulatory Visit: Payer: Self-pay | Admitting: Orthopaedic Surgery

## 2012-03-30 DIAGNOSIS — M6580 Other synovitis and tenosynovitis, unspecified site: Secondary | ICD-10-CM

## 2012-03-31 ENCOUNTER — Other Ambulatory Visit: Payer: Self-pay | Admitting: Thoracic Surgery

## 2012-03-31 DIAGNOSIS — C159 Malignant neoplasm of esophagus, unspecified: Secondary | ICD-10-CM

## 2012-04-03 ENCOUNTER — Ambulatory Visit
Admission: RE | Admit: 2012-04-03 | Discharge: 2012-04-03 | Disposition: A | Discharge status: Home / Self Care | Payer: Medicare Other | Source: Ambulatory Visit | Attending: Orthopaedic Surgery | Admitting: Orthopaedic Surgery

## 2012-04-03 ENCOUNTER — Ambulatory Visit
Admission: RE | Admit: 2012-04-03 | Discharge: 2012-04-03 | Disposition: A | Discharge status: Home / Self Care | Payer: Medicare Other | Source: Ambulatory Visit | Attending: Thoracic Surgery | Admitting: Thoracic Surgery

## 2012-04-03 DIAGNOSIS — M6580 Other synovitis and tenosynovitis, unspecified site: Secondary | ICD-10-CM

## 2012-04-03 DIAGNOSIS — C159 Malignant neoplasm of esophagus, unspecified: Secondary | ICD-10-CM

## 2012-04-04 ENCOUNTER — Encounter: Payer: Self-pay | Admitting: Thoracic Surgery

## 2012-04-04 ENCOUNTER — Ambulatory Visit (INDEPENDENT_AMBULATORY_CARE_PROVIDER_SITE_OTHER): Payer: Medicare Other | Admitting: Thoracic Surgery

## 2012-04-04 VITALS — BP 139/74 | HR 71 | Resp 20 | Ht 63.0 in | Wt 146.0 lb

## 2012-04-04 DIAGNOSIS — C159 Malignant neoplasm of esophagus, unspecified: Secondary | ICD-10-CM

## 2012-04-04 DIAGNOSIS — Z9889 Other specified postprocedural states: Secondary | ICD-10-CM

## 2012-04-04 NOTE — Progress Notes (Signed)
HPI patient returns today she says is the satisfactory but does have some occasional nausea and occasional diarrhea. I told her to stop her Reglan and see what happens. We will get away all her today. Overall and spears be doing well. There is some granulation tissue at the jejunostomy site and I cauterized that with silver nitrate. She will come back to see [place] if this does not heal. I will see her again in 6 weeks and get a CT scan of the chest and abdomen at that time. Her voice is improved. She says her hemoglobin is greater than 10. She can have hip surgery  when desired.  Current Outpatient Prescriptions  Medication Sig Dispense Refill  . atenolol (TENORMIN) 25 MG tablet Take 12.5 mg by mouth 2 (two) times daily.      Marland Kitchen buPROPion (WELLBUTRIN) 75 MG tablet Take 75 mg by mouth 2 (two) times daily.      . cycloSPORINE (RESTASIS) 0.05 % ophthalmic emulsion Place 1 drop into both eyes 2 (two) times daily.        Marland Kitchen lisinopril (PRINIVIL,ZESTRIL) 10 MG tablet Take 10 mg by mouth daily.      Marland Kitchen LORazepam (ATIVAN) 0.5 MG tablet Take 0.5 mg by mouth every 8 (eight) hours.      . metoCLOPramide (REGLAN) 10 MG tablet Take 10 mg by mouth 4 (four) times daily.      . diphenhydrAMINE (BENADRYL) 12.5 MG/5ML elixir Take 25 mg by mouth 4 (four) times daily as needed.         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 Severe arthritis left hip   Plan: Return in 6 weeks with a followup CT scan

## 2012-04-08 MED ORDER — BELLADONNA ALKALOIDS-OPIUM 16.2-60 MG RE SUPP
RECTAL | Status: AC
  Filled 2012-04-08: qty 1

## 2012-04-20 ENCOUNTER — Other Ambulatory Visit: Payer: Self-pay | Admitting: Thoracic Surgery

## 2012-04-20 DIAGNOSIS — D381 Neoplasm of uncertain behavior of trachea, bronchus and lung: Secondary | ICD-10-CM

## 2012-04-26 ENCOUNTER — Other Ambulatory Visit (HOSPITAL_COMMUNITY): Payer: Self-pay | Admitting: Orthopaedic Surgery

## 2012-04-27 ENCOUNTER — Encounter (HOSPITAL_COMMUNITY)
Admission: RE | Admit: 2012-04-27 | Discharge: 2012-04-27 | Disposition: A | Discharge status: Home / Self Care | Payer: Medicare Other | Source: Ambulatory Visit | Attending: Orthopaedic Surgery | Admitting: Orthopaedic Surgery

## 2012-04-27 ENCOUNTER — Encounter (HOSPITAL_COMMUNITY): Payer: Self-pay

## 2012-04-27 DIAGNOSIS — Z01812 Encounter for preprocedural laboratory examination: Secondary | ICD-10-CM | POA: Insufficient documentation

## 2012-04-27 HISTORY — DX: Other complications of anesthesia, initial encounter: T88.59XA

## 2012-04-27 HISTORY — DX: Adverse effect of unspecified anesthetic, initial encounter: T41.45XA

## 2012-04-27 HISTORY — DX: Chronic obstructive pulmonary disease, unspecified: J44.9

## 2012-04-27 LAB — BASIC METABOLIC PANEL
Calcium: 9.3 mg/dL (ref 8.4–10.5)
Creatinine, Ser: 0.83 mg/dL (ref 0.50–1.10)
GFR calc non Af Amer: 67 mL/min — ABNORMAL LOW (ref >90)
Glucose, Bld: 101 mg/dL — ABNORMAL HIGH (ref 70–99)
Sodium: 141 mEq/L (ref 135–145)

## 2012-04-27 LAB — URINALYSIS, ROUTINE W REFLEX MICROSCOPIC
Bilirubin Urine: NEGATIVE
Ketones, ur: NEGATIVE mg/dL
Leukocytes, UA: NEGATIVE
Nitrite: NEGATIVE
Protein, ur: NEGATIVE mg/dL
Urobilinogen, UA: 0.2 mg/dL (ref 0.0–1.0)
pH: 6 (ref 5.0–8.0)

## 2012-04-27 LAB — CBC
MCH: 26.6 pg (ref 26.0–34.0)
Platelets: 305 10*3/uL (ref 150–400)
RBC: 3.94 MIL/uL (ref 3.87–5.11)
RDW: 17.7 % — ABNORMAL HIGH (ref 11.5–15.5)

## 2012-04-27 LAB — APTT: aPTT: 31 seconds (ref 24–37)

## 2012-04-27 LAB — URINE MICROSCOPIC-ADD ON

## 2012-04-27 NOTE — Pre-Procedure Instructions (Signed)
04/04/12 Office visit- Dr Edwyna Shell - [facility]  11/18/11 Stress Test- [facility]  11/16/11 Office visit- Dr Jens Som  01/19/12/ECHO

## 2012-04-27 NOTE — Pre-Procedure Instructions (Signed)
04/27/12 Faxed to Dr Maureen Ralphs and confirmation received regarding patient information regarding " healing staph on left leg".  Instructed patient to notify Dr Maureen Ralphs on preop instructions.

## 2012-04-27 NOTE — Pre-Procedure Instructions (Signed)
04/27/12 Faxed notification of abnormal labs to Dr Doneen Poisson and received confirmation.

## 2012-04-27 NOTE — Patient Instructions (Signed)
20 [name]  04/27/2012   Your procedure is scheduled on:  05/05/12 1000am-12noon  Report to [facility] at 0730 AM.  Call this number if you have problems the morning of surgery: [phone]   Remember:   Do not eat food:After Midnight.  May have clear liquids:until Midnight .  Marland Kitchen  Take these medicines the morning of surgery with A SIP OF WATER:    Do not wear jewelry, make-up or nail polish.  Do not wear lotions, powders, or perfumes.  Do not shave 48 hours prior to surgery.  Do not bring valuables to the hospital.  Contacts, dentures or bridgework may not be worn into surgery.  Leave suitcase in the car. After surgery it may be brought to your room.  For patients admitted to the hospital, checkout time is 11:00 AM the day of discharge.       Special Instructions: CHG Shower Use Special Wash: 1/2 bottle night before surgery and 1/2 bottle morning of surgery. shower chin to toes with CHG.  Wash face and private parts with regular soap.    Please read over the following fact sheets that you were given: MRSA Information, Blood Transfusion Fact Sheet, Incentive Spirometry Fact Sheet, coughing and deep breathing exercies, leg exercises

## 2012-05-05 ENCOUNTER — Encounter (HOSPITAL_COMMUNITY): Admission: RE | Payer: Self-pay | Source: Ambulatory Visit

## 2012-05-05 ENCOUNTER — Ambulatory Visit (HOSPITAL_COMMUNITY): Admission: RE | Admit: 2012-05-05 | Payer: Medicare Other | Source: Ambulatory Visit | Admitting: Orthopaedic Surgery

## 2012-05-05 SURGERY — ARTHROPLASTY, HIP, TOTAL, ANTERIOR APPROACH
Anesthesia: General | Laterality: Left

## 2012-05-17 ENCOUNTER — Ambulatory Visit
Admission: RE | Admit: 2012-05-17 | Discharge: 2012-05-17 | Disposition: A | Discharge status: Home / Self Care | Payer: Medicare Other | Source: Ambulatory Visit | Attending: Thoracic Surgery | Admitting: Thoracic Surgery

## 2012-05-17 ENCOUNTER — Telehealth: Payer: Self-pay | Admitting: *Deleted

## 2012-05-17 ENCOUNTER — Ambulatory Visit (INDEPENDENT_AMBULATORY_CARE_PROVIDER_SITE_OTHER): Payer: Medicare Other | Admitting: Thoracic Surgery

## 2012-05-17 ENCOUNTER — Encounter: Payer: Self-pay | Admitting: Thoracic Surgery

## 2012-05-17 VITALS — BP 150/77 | HR 72 | Resp 18 | Ht 63.0 in | Wt 138.0 lb

## 2012-05-17 DIAGNOSIS — D381 Neoplasm of uncertain behavior of trachea, bronchus and lung: Secondary | ICD-10-CM

## 2012-05-17 DIAGNOSIS — C159 Malignant neoplasm of esophagus, unspecified: Secondary | ICD-10-CM

## 2012-05-17 DIAGNOSIS — Z09 Encounter for follow-up examination after completed treatment for conditions other than malignant neoplasm: Secondary | ICD-10-CM

## 2012-05-17 NOTE — Progress Notes (Signed)
HPI patient returns for followup CT scan today showed no evidence for recurrence of her esophageal cancer. We will get out of get her a followup appointment with Dr. Truett Perna. Also have her be followed in the future by Dr. Tyrone Sage. We'll see her back in 6 months with another CT scan. She can have surgery on her In 3 weeks. Her weight has been stable. She multiple small meals.   Current Outpatient Prescriptions  Medication Sig Dispense Refill  . acetaminophen (TYLENOL) 500 MG tablet Take 1,000 mg by mouth every 6 (six) hours as needed. PAIN      . atenolol (TENORMIN) 50 MG tablet Take 50 mg by mouth 2 (two) times daily.      . diphenhydrAMINE (BENADRYL) 25 MG tablet Take 25 mg by mouth See admin instructions. TAKE 1 TABLET 30 MINUTES PRIOR TO HYDROCODONE      . esomeprazole (NEXIUM) 40 MG capsule Take 40 mg by mouth daily before breakfast.      . HYDROcodone-acetaminophen (NORCO) 5-325 MG per tablet Take 1 tablet by mouth every 6 (six) hours as needed. PAIN      . lisinopril (PRINIVIL,ZESTRIL) 40 MG tablet Take 40 mg by mouth daily.      Marland Kitchen OVER THE COUNTER MEDICATION Take 1 tablet by mouth daily. [facility] WORT      . predniSONE (DELTASONE) 1 MG tablet Take 2 mg by mouth daily. 2 TABLETS A DAY      . raloxifene (EVISTA) 60 MG tablet Take 60 mg by mouth daily.      Marland Kitchen tiotropium (SPIRIVA) 18 MCG inhalation capsule Place 18 mcg into inhaler and inhale daily.      Marland Kitchen tiZANidine (ZANAFLEX) 4 MG tablet Take 2 mg by mouth every 6 (six) hours as needed. TAKES 1/2 TABLET-MUSCLE STIFFNESS      . traMADol (ULTRAM) 50 MG tablet Take 100 mg by mouth every 6 (six) hours as needed. 2 TABLETS A DAY AS NEEDED FOR PAIN         Review of Systems: Requires multiple small meals secondary to gastric emptingPhysical Exam incision is well healed jejunostomy incision healed   Diagnostic Tests: Lungs are clear attestation percussion CT scan showed no evidence of recurrence   Impression: Status post transhiatal  esophagogastrectomy for stage IIIa esophageal cancer   Plan followup by Dr.:

## 2012-05-17 NOTE — Telephone Encounter (Signed)
Pt was seen by Dr. Edwyna Shell today. Needs follow up with medical oncology. Will have schedulers call pt with appt.

## 2012-05-19 ENCOUNTER — Other Ambulatory Visit: Payer: Self-pay | Admitting: *Deleted

## 2012-05-23 ENCOUNTER — Telehealth: Payer: Self-pay | Admitting: *Deleted

## 2012-05-23 NOTE — Telephone Encounter (Signed)
Made patient appointment for 08-23-2012 per orders from 08-19-2012

## 2012-06-12 ENCOUNTER — Other Ambulatory Visit (HOSPITAL_COMMUNITY): Payer: Self-pay | Admitting: Orthopaedic Surgery

## 2012-06-22 ENCOUNTER — Encounter (HOSPITAL_COMMUNITY): Payer: Self-pay | Admitting: Pharmacy Technician

## 2012-06-23 ENCOUNTER — Encounter (HOSPITAL_COMMUNITY)
Admission: RE | Admit: 2012-06-23 | Discharge: 2012-06-23 | Disposition: A | Discharge status: Home / Self Care | Payer: Medicare Other | Source: Ambulatory Visit | Attending: Orthopaedic Surgery | Admitting: Orthopaedic Surgery

## 2012-06-23 ENCOUNTER — Encounter (HOSPITAL_COMMUNITY): Payer: Self-pay

## 2012-06-23 LAB — URINALYSIS, ROUTINE W REFLEX MICROSCOPIC
Bilirubin Urine: NEGATIVE
Ketones, ur: NEGATIVE mg/dL
Nitrite: NEGATIVE
Urobilinogen, UA: 0.2 mg/dL (ref 0.0–1.0)

## 2012-06-23 LAB — BASIC METABOLIC PANEL
BUN: 15 mg/dL (ref 6–23)
CO2: 25 mEq/L (ref 19–32)
Calcium: 9.7 mg/dL (ref 8.4–10.5)
Creatinine, Ser: 0.82 mg/dL (ref 0.50–1.10)
Glucose, Bld: 91 mg/dL (ref 70–99)

## 2012-06-23 LAB — CBC
HCT: 35.4 % — ABNORMAL LOW (ref 36.0–46.0)
MCH: 26.4 pg (ref 26.0–34.0)
MCV: 87.2 fL (ref 78.0–100.0)
Platelets: 381 10*3/uL (ref 150–400)
RDW: 16.5 % — ABNORMAL HIGH (ref 11.5–15.5)

## 2012-06-23 LAB — SURGICAL PCR SCREEN: MRSA, PCR: NEGATIVE

## 2012-06-23 LAB — URINE MICROSCOPIC-ADD ON

## 2012-06-23 NOTE — Patient Instructions (Signed)
20 [name]  06/23/2012   Your procedure is scheduled on:  06/30/12 0730am-0930am  Report to [facility] at 0530 AM.  Call this number if you have problems the morning of surgery: [phone]   Remember:   Do not eat food:After Midnight.  May have clear liquids:until Midnight .   Take these medicines the morning of surgery with A SIP OF WATER:    Do not wear jewelry, make-up or nail polish.  Do not wear lotions, powders, or perfumes.   Do not shave 48 hours prior to surgery.   Do not bring valuables to the hospital.  Contacts, dentures or bridgework may not be worn into surgery.  Leave suitcase in the car. After surgery it may be brought to your room.  For patients admitted to the hospital, checkout time is 11:00 AM the day of discharge.     Special Instructions: CHG Shower Use Special Wash: 1/2 bottle night before surgery and 1/2 bottle morning of surgery.   Please read over the following fact sheets that you were given: MRSA Information, Blood Transfusion Fact Sheet, coughing and deep breathing exercises, leg exercises

## 2012-06-23 NOTE — Progress Notes (Signed)
06/23/12 Patient reports no change in breathing status.

## 2012-06-23 NOTE — Progress Notes (Signed)
06/23/12 CT of chest done 05/17/12 - report on chart and in EPIC.  Office visit note of Dr Delrae Alfred 05/17/12 which discusses CT of chest.  EKG on chart done 01/27/12 on chart

## 2012-06-23 NOTE — Progress Notes (Signed)
06/23/12 Fax and confirmation received to report abnoraml urinalysis and micro results to Dr Maureen Ralphs done on 06/23/12.

## 2012-06-30 ENCOUNTER — Encounter (HOSPITAL_COMMUNITY): Payer: Self-pay | Admitting: *Deleted

## 2012-06-30 ENCOUNTER — Ambulatory Visit (HOSPITAL_COMMUNITY): Payer: Medicare Other | Admitting: *Deleted

## 2012-06-30 ENCOUNTER — Ambulatory Visit (HOSPITAL_COMMUNITY): Payer: Medicare Other

## 2012-06-30 ENCOUNTER — Inpatient Hospital Stay (HOSPITAL_COMMUNITY)
Admission: RE | Admit: 2012-06-30 | Discharge: 2012-07-04 | DRG: 470 | Disposition: A | Discharge status: Skilled Nursing Facility | Payer: Medicare Other | Source: Ambulatory Visit | Attending: Orthopaedic Surgery | Admitting: Orthopaedic Surgery

## 2012-06-30 ENCOUNTER — Encounter (HOSPITAL_COMMUNITY)
Admission: RE | Disposition: A | Discharge status: Skilled Nursing Facility | Payer: Self-pay | Source: Ambulatory Visit | Attending: Orthopaedic Surgery

## 2012-06-30 ENCOUNTER — Encounter (HOSPITAL_COMMUNITY): Payer: Self-pay | Admitting: Orthopedic Surgery

## 2012-06-30 DIAGNOSIS — D62 Acute posthemorrhagic anemia: Secondary | ICD-10-CM | POA: Diagnosis not present

## 2012-06-30 DIAGNOSIS — M810 Age-related osteoporosis without current pathological fracture: Secondary | ICD-10-CM | POA: Diagnosis present

## 2012-06-30 DIAGNOSIS — M1610 Unilateral primary osteoarthritis, unspecified hip: Principal | ICD-10-CM | POA: Diagnosis present

## 2012-06-30 DIAGNOSIS — I10 Essential (primary) hypertension: Secondary | ICD-10-CM | POA: Diagnosis present

## 2012-06-30 DIAGNOSIS — M169 Osteoarthritis of hip, unspecified: Secondary | ICD-10-CM

## 2012-06-30 DIAGNOSIS — Z01812 Encounter for preprocedural laboratory examination: Secondary | ICD-10-CM

## 2012-06-30 DIAGNOSIS — K449 Diaphragmatic hernia without obstruction or gangrene: Secondary | ICD-10-CM | POA: Diagnosis present

## 2012-06-30 DIAGNOSIS — Z85028 Personal history of other malignant neoplasm of stomach: Secondary | ICD-10-CM

## 2012-06-30 DIAGNOSIS — IMO0001 Reserved for inherently not codable concepts without codable children: Secondary | ICD-10-CM | POA: Diagnosis present

## 2012-06-30 DIAGNOSIS — J449 Chronic obstructive pulmonary disease, unspecified: Secondary | ICD-10-CM | POA: Diagnosis present

## 2012-06-30 DIAGNOSIS — J4489 Other specified chronic obstructive pulmonary disease: Secondary | ICD-10-CM | POA: Diagnosis present

## 2012-06-30 DIAGNOSIS — I499 Cardiac arrhythmia, unspecified: Secondary | ICD-10-CM | POA: Diagnosis present

## 2012-06-30 DIAGNOSIS — K219 Gastro-esophageal reflux disease without esophagitis: Secondary | ICD-10-CM | POA: Diagnosis present

## 2012-06-30 HISTORY — PX: TOTAL HIP ARTHROPLASTY: SHX124

## 2012-06-30 SURGERY — ARTHROPLASTY, HIP, TOTAL, ANTERIOR APPROACH
Anesthesia: General | Site: Hip | Laterality: Left | Wound class: Clean

## 2012-06-30 MED ORDER — HYDROCORTISONE SOD SUCCINATE 100 MG IJ SOLR
INTRAMUSCULAR | Status: AC
  Filled 2012-06-30: qty 2

## 2012-06-30 MED ORDER — MIDAZOLAM HCL 5 MG/5ML IJ SOLN
INTRAMUSCULAR | Status: DC | PRN
  Administered 2012-06-30: 0.5 mg via INTRAVENOUS
  Administered 2012-06-30: 1 mg via INTRAVENOUS

## 2012-06-30 MED ORDER — PREDNISONE 1 MG PO TABS
2.0000 mg | ORAL_TABLET | Freq: Every day | ORAL | Status: DC
  Administered 2012-07-01 – 2012-07-04 (×4): 2 mg via ORAL
  Filled 2012-06-30 (×6): qty 2

## 2012-06-30 MED ORDER — ONDANSETRON HCL 4 MG/2ML IJ SOLN
INTRAMUSCULAR | Status: DC | PRN
  Administered 2012-06-30 (×2): 2 mg via INTRAVENOUS

## 2012-06-30 MED ORDER — HYDROCODONE-ACETAMINOPHEN 5-325 MG PO TABS
1.0000 | ORAL_TABLET | ORAL | Status: DC | PRN
  Administered 2012-06-30 – 2012-07-04 (×7): 1 via ORAL
  Filled 2012-06-30 (×2): qty 1
  Filled 2012-06-30 (×2): qty 2
  Filled 2012-06-30: qty 1
  Filled 2012-06-30: qty 2
  Filled 2012-06-30: qty 1

## 2012-06-30 MED ORDER — LACTATED RINGERS IV SOLN: INTRAVENOUS | Status: DC

## 2012-06-30 MED ORDER — CEFAZOLIN SODIUM 1-5 GM-% IV SOLN
1.0000 g | Freq: Four times a day (QID) | INTRAVENOUS | Status: AC
  Administered 2012-06-30 (×2): 1 g via INTRAVENOUS
  Filled 2012-06-30 (×2): qty 50

## 2012-06-30 MED ORDER — FENTANYL CITRATE 0.05 MG/ML IJ SOLN
INTRAMUSCULAR | Status: DC | PRN
Start: 2012-06-30 — End: 2012-06-30
  Administered 2012-06-30 (×5): 50 ug via INTRAVENOUS
  Administered 2012-06-30: 100 ug via INTRAVENOUS

## 2012-06-30 MED ORDER — LISINOPRIL 40 MG PO TABS
40.0000 mg | ORAL_TABLET | Freq: Every day | ORAL | Status: DC
  Administered 2012-06-30 – 2012-07-04 (×5): 40 mg via ORAL
  Filled 2012-06-30 (×5): qty 1

## 2012-06-30 MED ORDER — ACETAMINOPHEN 10 MG/ML IV SOLN
INTRAVENOUS | Status: DC | PRN
  Administered 2012-06-30: 1000 mg via INTRAVENOUS

## 2012-06-30 MED ORDER — CHLORHEXIDINE GLUCONATE 4 % EX LIQD: 60.0000 mL | Freq: Once | CUTANEOUS | Status: DC

## 2012-06-30 MED ORDER — VANCOMYCIN HCL IN DEXTROSE 1-5 GM/200ML-% IV SOLN: 1000.0000 mg | INTRAVENOUS | Status: DC

## 2012-06-30 MED ORDER — PHENOL 1.4 % MT LIQD
1.0000 | OROMUCOSAL | Status: DC | PRN
  Filled 2012-06-30: qty 177

## 2012-06-30 MED ORDER — NEOSTIGMINE METHYLSULFATE 1 MG/ML IJ SOLN
INTRAMUSCULAR | Status: DC | PRN
  Administered 2012-06-30: 1 mg via INTRAVENOUS

## 2012-06-30 MED ORDER — VANCOMYCIN HCL 1000 MG IV SOLR
1000.0000 mg | INTRAVENOUS | Status: DC | PRN
  Administered 2012-06-30: 1000 mg via INTRAVENOUS

## 2012-06-30 MED ORDER — MENTHOL 3 MG MT LOZG
1.0000 | LOZENGE | OROMUCOSAL | Status: DC | PRN
  Filled 2012-06-30: qty 9

## 2012-06-30 MED ORDER — KETOROLAC TROMETHAMINE 15 MG/ML IJ SOLN
7.5000 mg | Freq: Four times a day (QID) | INTRAMUSCULAR | Status: AC
  Administered 2012-06-30 – 2012-07-01 (×4): 7.5 mg via INTRAVENOUS
  Filled 2012-06-30 (×4): qty 1

## 2012-06-30 MED ORDER — ASPIRIN EC 325 MG PO TBEC
325.0000 mg | DELAYED_RELEASE_TABLET | Freq: Two times a day (BID) | ORAL | Status: DC
  Administered 2012-07-01 – 2012-07-04 (×7): 325 mg via ORAL
  Filled 2012-06-30 (×11): qty 1

## 2012-06-30 MED ORDER — CEFAZOLIN SODIUM 1-5 GM-% IV SOLN: 1.0000 g | INTRAVENOUS | Status: DC

## 2012-06-30 MED ORDER — FERROUS SULFATE 325 (65 FE) MG PO TABS
325.0000 mg | ORAL_TABLET | Freq: Three times a day (TID) | ORAL | Status: DC
  Filled 2012-06-30 (×9): qty 1

## 2012-06-30 MED ORDER — DIPHENHYDRAMINE HCL 12.5 MG/5ML PO ELIX
12.5000 mg | ORAL_SOLUTION | ORAL | Status: DC | PRN
  Administered 2012-06-30 (×2): 25 mg via ORAL
  Administered 2012-07-01 – 2012-07-04 (×6): 12.5 mg via ORAL
  Filled 2012-06-30 (×4): qty 5
  Filled 2012-06-30: qty 10
  Filled 2012-06-30 (×3): qty 5
  Filled 2012-06-30 (×2): qty 10

## 2012-06-30 MED ORDER — TRAMADOL HCL 50 MG PO TABS
50.0000 mg | ORAL_TABLET | Freq: Four times a day (QID) | ORAL | Status: DC | PRN
  Administered 2012-06-30 (×3): 50 mg via ORAL
  Administered 2012-07-01 – 2012-07-04 (×7): 100 mg via ORAL
  Filled 2012-06-30 (×4): qty 2
  Filled 2012-06-30: qty 1
  Filled 2012-06-30 (×3): qty 2
  Filled 2012-06-30 (×2): qty 1

## 2012-06-30 MED ORDER — PANTOPRAZOLE SODIUM 40 MG PO TBEC
40.0000 mg | DELAYED_RELEASE_TABLET | Freq: Every day | ORAL | Status: DC
  Filled 2012-06-30: qty 1

## 2012-06-30 MED ORDER — ONDANSETRON HCL 4 MG/2ML IJ SOLN
4.0000 mg | Freq: Four times a day (QID) | INTRAMUSCULAR | Status: DC | PRN
  Administered 2012-07-01: 4 mg via INTRAVENOUS
  Filled 2012-06-30: qty 2

## 2012-06-30 MED ORDER — VANCOMYCIN HCL IN DEXTROSE 1-5 GM/200ML-% IV SOLN
INTRAVENOUS | Status: AC
  Filled 2012-06-30: qty 200

## 2012-06-30 MED ORDER — ALUM & MAG HYDROXIDE-SIMETH 200-200-20 MG/5ML PO SUSP: 30.0000 mL | ORAL | Status: DC | PRN

## 2012-06-30 MED ORDER — METOCLOPRAMIDE HCL 10 MG PO TABS: 5.0000 mg | ORAL_TABLET | Freq: Three times a day (TID) | ORAL | Status: DC | PRN

## 2012-06-30 MED ORDER — METOCLOPRAMIDE HCL 5 MG/ML IJ SOLN: 5.0000 mg | Freq: Three times a day (TID) | INTRAMUSCULAR | Status: DC | PRN

## 2012-06-30 MED ORDER — MORPHINE SULFATE 2 MG/ML IJ SOLN: 2.0000 mg | INTRAMUSCULAR | Status: DC | PRN

## 2012-06-30 MED ORDER — ATENOLOL 50 MG PO TABS
50.0000 mg | ORAL_TABLET | Freq: Two times a day (BID) | ORAL | Status: DC
  Administered 2012-06-30 – 2012-07-04 (×8): 50 mg via ORAL
  Filled 2012-06-30 (×11): qty 1

## 2012-06-30 MED ORDER — LIDOCAINE HCL 4 % MT SOLN
OROMUCOSAL | Status: DC | PRN
  Administered 2012-06-30: 4 mL via TOPICAL

## 2012-06-30 MED ORDER — LIDOCAINE HCL (CARDIAC) 20 MG/ML IV SOLN
INTRAVENOUS | Status: DC | PRN
  Administered 2012-06-30: 75 mg via INTRAVENOUS

## 2012-06-30 MED ORDER — PROPOFOL 10 MG/ML IV EMUL
INTRAVENOUS | Status: DC | PRN
  Administered 2012-06-30: 125 mg via INTRAVENOUS

## 2012-06-30 MED ORDER — SODIUM CHLORIDE 0.9 % IV SOLN
INTRAVENOUS | Status: DC
  Administered 2012-06-30: 1000 mL via INTRAVENOUS
  Administered 2012-06-30 – 2012-07-02 (×2): via INTRAVENOUS

## 2012-06-30 MED ORDER — CISATRACURIUM BESYLATE (PF) 10 MG/5ML IV SOLN
INTRAVENOUS | Status: DC | PRN
  Administered 2012-06-30: 10 mg via INTRAVENOUS

## 2012-06-30 MED ORDER — GLYCOPYRROLATE 0.2 MG/ML IJ SOLN
INTRAMUSCULAR | Status: DC | PRN
  Administered 2012-06-30: .2 mg via INTRAVENOUS

## 2012-06-30 MED ORDER — ALBUTEROL SULFATE HFA 108 (90 BASE) MCG/ACT IN AERS
INHALATION_SPRAY | RESPIRATORY_TRACT | Status: AC
  Filled 2012-06-30: qty 6.7

## 2012-06-30 MED ORDER — ONDANSETRON HCL 4 MG PO TABS
4.0000 mg | ORAL_TABLET | Freq: Four times a day (QID) | ORAL | Status: DC | PRN
  Administered 2012-07-04: 4 mg via ORAL
  Filled 2012-06-30: qty 1

## 2012-06-30 MED ORDER — CEFAZOLIN SODIUM 1-5 GM-% IV SOLN
INTRAVENOUS | Status: AC
  Filled 2012-06-30: qty 50

## 2012-06-30 MED ORDER — DOCUSATE SODIUM 100 MG PO CAPS
100.0000 mg | ORAL_CAPSULE | Freq: Two times a day (BID) | ORAL | Status: DC
  Administered 2012-06-30 – 2012-07-03 (×8): 100 mg via ORAL

## 2012-06-30 MED ORDER — TIOTROPIUM BROMIDE MONOHYDRATE 18 MCG IN CAPS
18.0000 ug | ORAL_CAPSULE | Freq: Every day | RESPIRATORY_TRACT | Status: DC
  Administered 2012-07-01 – 2012-07-04 (×4): 18 ug via RESPIRATORY_TRACT
  Filled 2012-06-30: qty 5

## 2012-06-30 MED ORDER — OXYCODONE HCL 5 MG PO TABS: 5.0000 mg | ORAL_TABLET | ORAL | Status: DC | PRN

## 2012-06-30 MED ORDER — ACETAMINOPHEN 10 MG/ML IV SOLN
INTRAVENOUS | Status: AC
  Filled 2012-06-30: qty 100

## 2012-06-30 MED ORDER — RALOXIFENE HCL 60 MG PO TABS
60.0000 mg | ORAL_TABLET | Freq: Every day | ORAL | Status: DC
  Administered 2012-06-30 – 2012-07-04 (×5): 60 mg via ORAL
  Filled 2012-06-30 (×5): qty 1

## 2012-06-30 MED ORDER — TIZANIDINE HCL 2 MG PO TABS
2.0000 mg | ORAL_TABLET | Freq: Four times a day (QID) | ORAL | Status: DC | PRN
  Administered 2012-07-01 – 2012-07-02 (×2): 2 mg via ORAL
  Filled 2012-06-30 (×2): qty 1

## 2012-06-30 MED ORDER — LACTATED RINGERS IV SOLN
INTRAVENOUS | Status: DC | PRN
  Administered 2012-06-30 (×2): via INTRAVENOUS

## 2012-06-30 MED ORDER — HYDROCORTISONE SOD SUCCINATE 100 MG PF FOR IT USE
INTRAMUSCULAR | Status: DC | PRN
  Administered 2012-06-30: 50 mg via INTRATHECAL

## 2012-06-30 MED ORDER — CEFAZOLIN SODIUM 1-5 GM-% IV SOLN
INTRAVENOUS | Status: DC | PRN
  Administered 2012-06-30: 1 g via INTRAVENOUS

## 2012-06-30 MED ORDER — HYDROMORPHONE HCL PF 1 MG/ML IJ SOLN
0.2500 mg | INTRAMUSCULAR | Status: DC | PRN
  Administered 2012-06-30 (×2): 0.5 mg via INTRAVENOUS

## 2012-06-30 MED ORDER — EPHEDRINE SULFATE 50 MG/ML IJ SOLN
INTRAMUSCULAR | Status: DC | PRN
  Administered 2012-06-30 (×3): 5 mg via INTRAVENOUS

## 2012-06-30 MED ORDER — 0.9 % SODIUM CHLORIDE (POUR BTL) OPTIME
TOPICAL | Status: DC | PRN
  Administered 2012-06-30: 1000 mL

## 2012-06-30 MED ORDER — SODIUM CHLORIDE 0.9 % IR SOLN
Status: DC | PRN
  Administered 2012-06-30: 1000 mL

## 2012-06-30 MED ORDER — ESOMEPRAZOLE MAGNESIUM 40 MG PO CPDR
40.0000 mg | DELAYED_RELEASE_CAPSULE | Freq: Every day | ORAL | Status: DC
  Administered 2012-06-30 – 2012-07-04 (×5): 40 mg via ORAL
  Filled 2012-06-30 (×7): qty 1

## 2012-06-30 MED ORDER — PROMETHAZINE HCL 25 MG/ML IJ SOLN: 6.2500 mg | INTRAMUSCULAR | Status: DC | PRN

## 2012-06-30 MED ORDER — ZOLPIDEM TARTRATE 5 MG PO TABS
5.0000 mg | ORAL_TABLET | Freq: Every evening | ORAL | Status: DC | PRN
  Administered 2012-07-01: 5 mg via ORAL
  Filled 2012-06-30: qty 1

## 2012-06-30 MED ORDER — HYDROMORPHONE HCL PF 1 MG/ML IJ SOLN
INTRAMUSCULAR | Status: AC
  Filled 2012-06-30: qty 1

## 2012-06-30 MED ORDER — ACETAMINOPHEN 325 MG PO TABS
650.0000 mg | ORAL_TABLET | Freq: Four times a day (QID) | ORAL | Status: DC | PRN
  Administered 2012-06-30: 325 mg via ORAL
  Filled 2012-06-30: qty 1

## 2012-06-30 MED ORDER — ACETAMINOPHEN 650 MG RE SUPP: 650.0000 mg | Freq: Four times a day (QID) | RECTAL | Status: DC | PRN

## 2012-06-30 SURGICAL SUPPLY — 37 items
BAG SPEC THK2 15X12 ZIP CLS (MISCELLANEOUS) ×1
BAG ZIPLOCK 12X15 (MISCELLANEOUS) ×2 IMPLANT
BLADE SAW SGTL 18X1.27X75 (BLADE) ×2 IMPLANT
CLOTH BEACON ORANGE TIMEOUT ST (SAFETY) ×2 IMPLANT
DRAPE C-ARM 42X72 X-RAY (DRAPES) ×2 IMPLANT
DRAPE STERI IOBAN 125X83 (DRAPES) ×2 IMPLANT
DRAPE U-SHAPE 47X51 STRL (DRAPES) ×6 IMPLANT
DRSG MEPILEX BORDER 4X8 (GAUZE/BANDAGES/DRESSINGS) ×2 IMPLANT
DURAPREP 26ML APPLICATOR (WOUND CARE) ×2 IMPLANT
ELECT BLADE TIP CTD 4 INCH (ELECTRODE) ×2 IMPLANT
ELECT REM PT RETURN 9FT ADLT (ELECTROSURGICAL) ×2
ELECTRODE REM PT RTRN 9FT ADLT (ELECTROSURGICAL) ×1 IMPLANT
FACESHIELD LNG OPTICON STERILE (SAFETY) ×8 IMPLANT
GAUZE XEROFORM 1X8 LF (GAUZE/BANDAGES/DRESSINGS) ×2 IMPLANT
GLOVE BIO SURGEON STRL SZ7 (GLOVE) ×2 IMPLANT
GLOVE BIO SURGEON STRL SZ7.5 (GLOVE) ×2 IMPLANT
GLOVE BIOGEL PI IND STRL 7.5 (GLOVE) ×1 IMPLANT
GLOVE BIOGEL PI IND STRL 8 (GLOVE) ×1 IMPLANT
GLOVE BIOGEL PI INDICATOR 7.5 (GLOVE) ×1
GLOVE BIOGEL PI INDICATOR 8 (GLOVE) ×1
GLOVE ECLIPSE 7.0 STRL STRAW (GLOVE) ×2 IMPLANT
GLOVE SURG ORTHO 7.0 STRL STRW (GLOVE) ×2 IMPLANT
GOWN STRL REIN XL XLG (GOWN DISPOSABLE) ×4 IMPLANT
HANDPIECE INTERPULSE COAX TIP (DISPOSABLE) ×2
KIT BASIN OR (CUSTOM PROCEDURE TRAY) ×2 IMPLANT
PACK TOTAL JOINT (CUSTOM PROCEDURE TRAY) ×2 IMPLANT
PADDING CAST COTTON 6X4 STRL (CAST SUPPLIES) ×2 IMPLANT
SET HNDPC FAN SPRY TIP SCT (DISPOSABLE) ×1 IMPLANT
STAPLER VISISTAT 35W (STAPLE) ×2 IMPLANT
SUT ETHIBOND NAB CT1 #1 30IN (SUTURE) ×2 IMPLANT
SUT VIC AB 1 CT1 36 (SUTURE) IMPLANT
SUT VIC AB 2-0 CT1 27 (SUTURE) ×4
SUT VIC AB 2-0 CT1 TAPERPNT 27 (SUTURE) ×2 IMPLANT
SUT VLOC 180 0 24IN GS25 (SUTURE) ×2 IMPLANT
TOWEL OR 17X26 10 PK STRL BLUE (TOWEL DISPOSABLE) ×4 IMPLANT
TOWEL OR NON WOVEN STRL DISP B (DISPOSABLE) ×2 IMPLANT
TRAY FOLEY CATH 14FRSI W/METER (CATHETERS) ×4 IMPLANT

## 2012-06-30 NOTE — Anesthesia Procedure Notes (Signed)
Procedure Name: Intubation Date/Time: 06/30/2012 7:42 AM Performed by: Edison Pace Pre-anesthesia Checklist: Patient identified, Timeout performed, Emergency Drugs available, Suction available and Patient being monitored Patient Re-evaluated:Patient Re-evaluated prior to inductionOxygen Delivery Method: Circle system utilized Preoxygenation: Pre-oxygenation with 100% oxygen Intubation Type: Inhalational induction and IV induction Ventilation: Mask ventilation without difficulty Laryngoscope Size: Mac and 3 Grade View: Grade I Tube type: Oral Tube size: 7.5 mm Number of attempts: 1 Airway Equipment and Method: Stylet Placement Confirmation: ETT inserted through vocal cords under direct vision,  breath sounds checked- equal and bilateral and positive ETCO2 Secured at: 21 cm Tube secured with: Tape Dental Injury: Teeth and Oropharynx as per pre-operative assessment

## 2012-06-30 NOTE — Progress Notes (Signed)
Utilization review completed.  

## 2012-06-30 NOTE — Anesthesia Postprocedure Evaluation (Signed)
Anesthesia Post Note  Patient: [name]  Procedure(s) Performed: Procedure(s) (LRB): TOTAL HIP ARTHROPLASTY ANTERIOR APPROACH (Left)  Anesthesia type: General  Patient location: PACU  Post pain: Pain level controlled  Post assessment: Post-op Vital signs reviewed  Last Vitals:  Filed Vitals:   06/30/12 1015  BP: 171/82  Pulse: 76  Temp:   Resp: 10    Post vital signs: Reviewed  Level of consciousness: sedated  Complications: No apparent anesthesia complications

## 2012-06-30 NOTE — Transfer of Care (Signed)
Immediate Anesthesia Transfer of Care Note  Patient: [name]  Procedure(s) Performed: Procedure(s) (LRB): TOTAL HIP ARTHROPLASTY ANTERIOR APPROACH (Left)  Patient Location: PACU  Anesthesia Type: General  Level of Consciousness: awake, oriented, patient cooperative, lethargic and responds to stimulation  Airway & Oxygen Therapy: Patient Spontanous Breathing and Patient connected to face mask oxygen  Post-op Assessment: Report given to PACU RN, Post -op Vital signs reviewed and stable and Patient moving all extremities  Post vital signs: Reviewed and stable  Complications: No apparent anesthesia complications

## 2012-06-30 NOTE — Brief Op Note (Signed)
06/30/2012  9:20 AM  PATIENT:  Sterling Big  78 y.o. female  PRE-OPERATIVE DIAGNOSIS:  Endstage osteoarthritis left hip  POST-OPERATIVE DIAGNOSIS:  Endstage osteoarthritis left hip  PROCEDURE:  Procedure(s) (LRB): TOTAL HIP ARTHROPLASTY ANTERIOR APPROACH (Left)  SURGEON:  Surgeon(s) and Role:    * Kathryne Hitch, MD - Primary  PHYSICIAN ASSISTANT:   ASSISTANTS: [name], PA-C    ANESTHESIA:   general  EBL:  Total I/O In: 1000 [I.V.:1000] Out: 750 [Urine:250; Blood:500]  BLOOD ADMINISTERED:none  DRAINS: none   LOCAL MEDICATIONS USED:  NONE  SPECIMEN:  No Specimen  DISPOSITION OF SPECIMEN:  N/A  COUNTS:  YES  TOURNIQUET:  * No tourniquets in log *  DICTATION: .Other Dictation: Dictation Number [id number]  PLAN OF CARE: Admit to inpatient   PATIENT DISPOSITION:  PACU - hemodynamically stable.   Delay start of Pharmacological VTE agent (>24hrs) due to surgical blood loss or risk of bleeding: no

## 2012-06-30 NOTE — Anesthesia Preprocedure Evaluation (Signed)
Anesthesia Evaluation  Patient identified by MRN, date of birth, ID band Patient awake    Airway Mallampati: II TM Distance: >3 FB     Dental  (+) Teeth Intact, Caps and Dental Advisory Given   Pulmonary shortness of breath and with exertion, COPDformer smoker,  breath sounds clear to auscultation  Pulmonary exam normal       Cardiovascular hypertension, Pt. on medications and Pt. on home beta blockers + dysrhythmias Rhythm:Regular Rate:Normal     Neuro/Psych  Neuromuscular disease negative psych ROS   GI/Hepatic Neg liver ROS, hiatal hernia, PUD, GERD-  Medicated,Hx of gastro-esophageal cancer; s/p resection   Endo/Other  negative endocrine ROS  Renal/GU negative Renal ROS     Musculoskeletal  (+) Fibromyalgia -, narcotic dependent  Abdominal   Peds  Hematology Pernicious anemia   Anesthesia Other Findings   Reproductive/Obstetrics negative OB ROS                           Anesthesia Physical Anesthesia Plan  ASA: III  Anesthesia Plan: General   Post-op Pain Management:    Induction: Intravenous  Airway Management Planned: Oral ETT  Additional Equipment:   Intra-op Plan:   Post-operative Plan: Extubation in OR  Informed Consent: I have reviewed the patients History and Physical, chart, labs and discussed the procedure including the risks, benefits and alternatives for the proposed anesthesia with the patient or authorized representative who has indicated his/her understanding and acceptance.   Dental advisory given  Plan Discussed with: CRNA  Anesthesia Plan Comments:         Anesthesia Quick Evaluation

## 2012-06-30 NOTE — H&P (Signed)
[name] is an 78 y.o. female.   Chief Complaint:   Severe left hip pain HPI:   78 yo female with bone-on-bone wear of her left hip.  Walks with a crutch.  Failed conservative treatment.  Wishes to proceed with a left total hip replacement.  Understands the risks of infection, blood loss, fracture, nerve injury and DVT.  The goals are improved mobility and decreased pain.  Past Medical History  Diagnosis Date  . Pernicious anemia   . Osteoarthritis   . Fibromyalgia   . Skin lesion     chronic calcific cutaneous lesions  . HX of multiple bleeding ulcers 09/01/2011  . Hypertension     takes Atenolol daily  . Emphysema   . Bronchitis     hx  . Adenocarcinoma of gastroesophageal junction   . Dizziness     d/t crystals in ears that "roll around"and can't get them out  . Back pain   . Osteoporosis   . Scoliosis   . Psoriasis   . H/O hiatal hernia   . GERD (gastroesophageal reflux disease)     takes Aciphex daily  . Gastric ulcer   . Constipation     since Chemo and Radiation;finished up in Nov 2012  . Diverticulosis   . History of colonic polyps   . Shingles     broke out 2wks ago;has been on meds and to finish up tomorrow  . Kidney stone 25+yrs ago  . Pernicious anemia   . Vitamin B deficiency   . Complication of anesthesia     shortness of breath and pain with gas   . COPD (chronic obstructive pulmonary disease)   . Shortness of breath     with exertion   . Blood transfusion     Past Surgical History  Procedure Date  . Right hip replacement 2001  . Tonsillectomy     as a child  . Tubal ligation 1975  . Appendectomy 1975  . Cataract surgery 10+yrs ago  . Esophagogastroduodenoscopy   . Colonoscopy   . Extubation 12/20/2011       . Partial esophagectomy 12/20/2011    Procedure: ESOPHAGECTOMY PARTIAL;  Surgeon: Norton Blizzard, MD;  Location: [facility] OR;  Service: Thoracic;  Laterality: N/A;    Family History  Problem Relation Age of Onset  . Cancer Maternal  Grandmother     gynecologic  . Cancer Maternal Aunt     gynecologic  . Breast cancer Paternal Aunt   . Breast cancer Paternal Aunt   . Breast cancer Paternal Aunt   . Anesthesia problems Neg Hx   . Hypotension Neg Hx   . Malignant hyperthermia Neg Hx   . Pseudochol deficiency Neg Hx    Social History:  reports that she quit smoking about 20 years ago. She has never used smokeless tobacco. She reports that she does not drink alcohol or use illicit drugs.  Allergies:  Allergies  Allergen Reactions  . Adhesive (Tape) Itching  . Clarithromycin Itching  . Darvon Itching  . Epinephrine Itching  . Morphine And Related Itching  . Oxycodone Itching  . Oxycodone Hcl Er Itching  . Sulfa Antibiotics Itching  . Vicodin (Hydrocodone-Acetaminophen) Itching    Can take with Benadryl    Medications Prior to Admission  Medication Sig Dispense Refill  . acetaminophen (TYLENOL) 500 MG tablet Take 1,000 mg by mouth every 6 (six) hours as needed. PAIN      . atenolol (TENORMIN) 50 MG tablet Take  50 mg by mouth 2 (two) times daily.      . diphenhydrAMINE (BENADRYL) 25 MG tablet Take 25 mg by mouth See admin instructions. TAKE 1 TABLET 30 MINUTES PRIOR TO HYDROCODONE      . esomeprazole (NEXIUM) 40 MG capsule Take 40 mg by mouth daily before breakfast.      . HYDROcodone-acetaminophen (NORCO) 5-325 MG per tablet Take 1 tablet by mouth every 6 (six) hours as needed. PAIN      . lisinopril (PRINIVIL,ZESTRIL) 40 MG tablet Take 40 mg by mouth every morning.       Marland Kitchen OVER THE COUNTER MEDICATION Take 1 tablet by mouth 2 (two) times daily. [facility] J[facility] WORT      . predniSONE (DELTASONE) 1 MG tablet Take 2 mg by mouth every morning. 2 TABLETS A DAY      . raloxifene (EVISTA) 60 MG tablet Take 60 mg by mouth every morning.       . tiotropium (SPIRIVA) 18 MCG inhalation capsule Place 18 mcg into inhaler and inhale every morning.       Marland Kitchen tiZANidine (ZANAFLEX) 4 MG tablet Take 2 mg by mouth every 6 (six) hours as  needed. TAKES 1/2 TABLET-MUSCLE STIFFNESS      . traMADol (ULTRAM) 50 MG tablet Take 100 mg by mouth every 6 (six) hours as needed. 2 TABLETS A DAY AS NEEDED FOR PAIN      . amLODipine (NORVASC) 5 MG tablet Take 5 mg by mouth daily. Pt takes in am        Results for orders placed during the hospital encounter of 06/30/12 (from the past 48 hour(s))  TYPE AND SCREEN     Status: Normal   Collection Time   06/30/12  5:50 AM      Component Value Range Comment   ABO/RH(D) O NEG      Antibody Screen NEG      Sample Expiration 07/03/2012      No results found.  Review of Systems  All other systems reviewed and are negative.    Blood pressure 159/74, pulse 68, temperature 97.6 F (36.4 C), temperature source Oral, resp. rate 18, SpO2 98.00%. Physical Exam  Constitutional: She is oriented to person, place, and time. She appears well-developed and well-nourished.  HENT:  Head: Normocephalic and atraumatic.  Eyes: EOM are normal. Pupils are equal, round, and reactive to light.  Neck: Normal range of motion. Neck supple.  Cardiovascular: Normal rate and regular rhythm.   Respiratory: Effort normal and breath sounds normal.  GI: Soft. Bowel sounds are normal.  Musculoskeletal:       Left hip: She exhibits decreased range of motion, decreased strength, bony tenderness and deformity.  Neurological: She is alert and oriented to person, place, and time.  Skin: Skin is warm and dry.  Psychiatric: She has a normal mood and affect.     Assessment/Plan End-stage arthritis left hip 1) to the OR today for a left total hip replacement  BLACKMAN,CHRISTOPHER Y 06/30/2012, 7:26 AM

## 2012-06-30 NOTE — Preoperative (Signed)
Beta Blockers   Reason not to administer Beta Blockers:Not Applicablept took beta blocker this a.m. 

## 2012-07-01 LAB — BASIC METABOLIC PANEL
CO2: 22 mEq/L (ref 19–32)
Chloride: 104 mEq/L (ref 96–112)
GFR calc non Af Amer: 81 mL/min — ABNORMAL LOW (ref >90)
Glucose, Bld: 115 mg/dL — ABNORMAL HIGH (ref 70–99)
Potassium: 3.6 mEq/L (ref 3.5–5.1)
Sodium: 134 mEq/L — ABNORMAL LOW (ref 135–145)

## 2012-07-01 LAB — CBC
Hemoglobin: 8.2 g/dL — ABNORMAL LOW (ref 12.0–15.0)
RBC: 3.03 MIL/uL — ABNORMAL LOW (ref 3.87–5.11)
WBC: 6.5 10*3/uL (ref 4.0–10.5)

## 2012-07-01 MED ORDER — VITAMINS A & D EX OINT
TOPICAL_OINTMENT | CUTANEOUS | Status: AC
  Administered 2012-07-01: 18:00:00
  Filled 2012-07-01: qty 5

## 2012-07-01 MED ORDER — VANCOMYCIN HCL IN DEXTROSE 1-5 GM/200ML-% IV SOLN
1000.0000 mg | Freq: Once | INTRAVENOUS | Status: AC
  Administered 2012-07-01: 1000 mg via INTRAVENOUS
  Filled 2012-07-01: qty 200

## 2012-07-01 NOTE — Progress Notes (Signed)
Physical Therapy Treatment Patient Details Name: [name] MRN: [id number] DOB: 12/14/1933 Today's Date: 07/01/2012 Time: 4696-2952 PT Time Calculation (min): 13 min  PT Assessment / Plan / Recommendation Comments on Treatment Session       Follow Up Recommendations  Home health PT;Skilled nursing facility    Barriers to Discharge        Equipment Recommendations  None recommended by PT    Recommendations for Other Services OT consult  Frequency 7X/week   Plan Discharge plan remains appropriate    Precautions / Restrictions Precautions Precautions: None Restrictions Weight Bearing Restrictions: No Other Position/Activity Restrictions: WBAT   Pertinent Vitals/Pain     Mobility  Bed Mobility Bed Mobility: Sit to Supine Supine to Sit: 3: Mod assist Sit to Supine: 3: Mod assist Details for Bed Mobility Assistance: cues for sequence and use of UEs and R LE to self assist Transfers Transfers: Sit to Stand;Stand to Sit Sit to Stand: 1: +2 Total assist Sit to Stand: Patient Percentage: 60% Stand to Sit: 1: +2 Total assist Stand to Sit: Patient Percentage: 60% Details for Transfer Assistance: cues for use of UEs and for LE managment Ambulation/Gait Ambulation/Gait Assistance: 1: +2 Total assist Ambulation/Gait: Patient Percentage: 60% Ambulation Distance (Feet): 5 Feet Assistive device: Rolling walker Ambulation/Gait Assistance Details: cues for sequence, posture, and position from RW Gait Pattern: Step-to pattern;Decreased step length - left;Decreased step length - right;Decreased stance time - left Gait velocity: slow    Exercises Total Joint Exercises Ankle Circles/Pumps: AROM;10 reps;Supine;Both Quad Sets: AROM;10 reps;Both;Supine Heel Slides: AAROM;Left;Supine;10 reps Hip ABduction/ADduction: AAROM;10 reps;Supine;Left   PT Diagnosis: Difficulty walking  PT Problem List: Decreased strength;Decreased range of motion;Decreased activity tolerance;Decreased  mobility;Pain;Decreased knowledge of use of DME PT Treatment Interventions: DME instruction;Gait training;Stair training;Functional mobility training;Therapeutic activities;Therapeutic exercise;Patient/family education   PT Goals Acute Rehab PT Goals PT Goal Formulation: With patient Time For Goal Achievement: 07/05/12 Potential to Achieve Goals: Good Pt will go Supine/Side to Sit: with supervision PT Goal: Supine/Side to Sit - Progress: Goal set today Pt will go Sit to Supine/Side: with supervision PT Goal: Sit to Supine/Side - Progress: Goal set today Pt will go Sit to Stand: with supervision PT Goal: Sit to Stand - Progress: Goal set today Pt will go Stand to Sit: with supervision PT Goal: Stand to Sit - Progress: Goal set today Pt will Ambulate: 51 - 150 feet;with supervision;with rolling walker PT Goal: Ambulate - Progress: Goal set today  Visit Information  Last PT Received On: 07/01/12 Assistance Needed: +2    Subjective Data  Subjective: I stayed up for almost 2 hrs, I think I did pretty good Patient Stated Goal: Home with son   Cognition  Overall Cognitive Status: Appears within functional limits for tasks assessed/performed Arousal/Alertness: Awake/alert Orientation Level: Appears intact for tasks assessed Behavior During Session: [facility] for tasks performed    Balance     End of Session PT - End of Session Equipment Utilized During Treatment: Gait belt Activity Tolerance: Patient limited by fatigue;Patient limited by pain Patient left: with call bell/phone within reach;in bed;with family/visitor present Nurse Communication: Mobility status   GP     Velta Rockholt 07/01/2012, 2:44 PM

## 2012-07-01 NOTE — Progress Notes (Signed)
Subjective: 1 Day Post-Op Procedure(s) (LRB): TOTAL HIP ARTHROPLASTY ANTERIOR APPROACH (Left) This patient is awake alert oriented x4 apparently she is discussed with Dr. Magnus Ivan keeping her Foley catheter today and nursing is just noted the order to remove the catheter this morning. I had discussion with the patient and she indicated that she had talked with Dr. Magnus Ivan and she wants to keep the catheter. I located this for 1 more day but then it has come out to prevent UTI. Patient reports pain as moderate.    Objective:   VITALS:  Temp:  [98.2 F (36.8 C)-99.6 F (37.6 C)] 99.2 F (37.3 C) (07/27 1418) Pulse Rate:  [71-100] 90  (07/27 1418) Resp:  [14-18] 16  (07/27 1418) BP: (114-156)/(66-78) 121/78 mmHg (07/27 1418) SpO2:  [92 %-99 %] 92 % (07/27 1418)  Neurologically intact ABD soft Neurovascular intact Sensation intact distally Dorsiflexion/Plantar flexion intact Incision: dressing C/D/I   LABS  Basename 07/01/12 0442  HGB 8.2*  WBC 6.5  PLT 240    Basename 07/01/12 0442  NA 134*  K 3.6  CL 104  CO2 22  BUN 9  CREATININE 0.72  GLUCOSE 115*   No results found for this basename: LABPT:2,INR:2 in the last 72 hours   Assessment/Plan: 1 Day Post-Op Procedure(s) (LRB): TOTAL HIP ARTHROPLASTY ANTERIOR APPROACH (Left) Anemia acute on chronic changes.  Advance diet Up with therapy Continue foley due to urinary output monitoring and Initial difficulty in mobilization to void Will continue IV antibiotics another 24 hours then discontinue Foley in the a.m.  Cesiah Westley E 07/01/2012, 3:45 PM

## 2012-07-01 NOTE — Evaluation (Signed)
Physical Therapy Evaluation Patient Details Name: [name] MRN: [id number] DOB: Nov 11, 1934 Today's Date: 07/01/2012 Time: 4098-1191 PT Time Calculation (min): 39 min  PT Assessment / Plan / Recommendation Clinical Impression  Pt wtih L THR presents wtih decreased L LE strength/ROM and significant limitations in functional mobility    PT Assessment  Patient needs continued PT services    Follow Up Recommendations  Home health PT;Skilled nursing facility (dependent on acute stay progress and level of assist at home)    Barriers to Discharge        Equipment Recommendations  None recommended by PT    Recommendations for Other Services OT consult   Frequency 7X/week    Precautions / Restrictions Precautions Precautions: None Restrictions Weight Bearing Restrictions: No Other Position/Activity Restrictions: WBAT   Pertinent Vitals/Pain 6/10; premedicated; ice packs provided      Mobility  Bed Mobility Bed Mobility: Supine to Sit Supine to Sit: 3: Mod assist Details for Bed Mobility Assistance: cues for sequence and use of UEs and R LE to self assist Transfers Transfers: Sit to Stand;Stand to Sit Sit to Stand: 1: +2 Total assist Sit to Stand: Patient Percentage: 60% Stand to Sit: 1: +2 Total assist Stand to Sit: Patient Percentage: 60% Details for Transfer Assistance: cues for use of UEs and for LE managment Ambulation/Gait Ambulation/Gait Assistance: 1: +2 Total assist Ambulation/Gait: Patient Percentage: 60% Ambulation Distance (Feet): 7 Feet Assistive device: Rolling walker Ambulation/Gait Assistance Details: cues for posture, sequence, position from RW and increased UE WB Gait Pattern: Step-to pattern;Decreased step length - left;Decreased step length - right;Decreased stance time - left Gait velocity: slow    Exercises Total Joint Exercises Ankle Circles/Pumps: AROM;10 reps;Supine;Both Quad Sets: AROM;10 reps;Both;Supine Heel Slides:  AAROM;Left;Supine;10 reps Hip ABduction/ADduction: AAROM;10 reps;Supine;Left   PT Diagnosis: Difficulty walking  PT Problem List: Decreased strength;Decreased range of motion;Decreased activity tolerance;Decreased mobility;Pain;Decreased knowledge of use of DME PT Treatment Interventions: DME instruction;Gait training;Stair training;Functional mobility training;Therapeutic activities;Therapeutic exercise;Patient/family education   PT Goals Acute Rehab PT Goals PT Goal Formulation: With patient Time For Goal Achievement: 07/05/12 Potential to Achieve Goals: Good Pt will go Supine/Side to Sit: with supervision PT Goal: Supine/Side to Sit - Progress: Goal set today Pt will go Sit to Supine/Side: with supervision PT Goal: Sit to Supine/Side - Progress: Goal set today Pt will go Sit to Stand: with supervision PT Goal: Sit to Stand - Progress: Goal set today Pt will go Stand to Sit: with supervision PT Goal: Stand to Sit - Progress: Goal set today Pt will Ambulate: 51 - 150 feet;with supervision;with rolling walker PT Goal: Ambulate - Progress: Goal set today  Visit Information  Last PT Received On: 07/01/12 Assistance Needed: +2    Subjective Data  Subjective: You think I'm going to put weight on this leg? Patient Stated Goal: Home with some   Prior Functioning  Home Living Lives With: Son Available Help at Discharge: Family Type of Home: House Home Access: Level entry Home Layout: Able to live on main level with bedroom/bathroom Home Adaptive Equipment: Crutches;Walker - standard;Walker - rolling Prior Function Level of Independence: Independent with assistive device(s) Communication Communication: No difficulties    Cognition  Overall Cognitive Status: Appears within functional limits for tasks assessed/performed Arousal/Alertness: Awake/alert Orientation Level: Appears intact for tasks assessed Behavior During Session: [facility] for tasks performed    Extremity/Trunk  Assessment Right Upper Extremity Assessment RUE ROM/Strength/Tone: [facility] for tasks assessed Left Upper Extremity Assessment LUE ROM/Strength/Tone: [facility] for tasks assessed Right  Lower Extremity Assessment RLE ROM/Strength/Tone: WFL for tasks assessed Left Lower Extremity Assessment LLE ROM/Strength/Tone: Deficits LLE ROM/Strength/Tone Deficits: 2/5 HIP STRENGTH; AAROM at hip to 75 flex and 15 abd   Balance    End of Session PT - End of Session Equipment Utilized During Treatment: Gait belt Activity Tolerance: Patient limited by fatigue;Patient limited by pain Patient left: in chair;with call bell/phone within reach Nurse Communication: Mobility status  GP     Shantele Reller 07/01/2012, 2:38 PM

## 2012-07-01 NOTE — Op Note (Signed)
NAMESHUNTIA, EXTON NO.:  [id number]  MEDICAL RECORD NO.:  [id number]  LOCATION:  1604                         FACILITY:  [facility]  PHYSICIAN:  Vanita Panda. Magnus Ivan, M.D.DATE OF BIRTH:  December 26, 1933  DATE OF PROCEDURE:  06/30/2012 DATE OF DISCHARGE:                              OPERATIVE REPORT   PREOPERATIVE DIAGNOSIS:  End-stage arthritis and degenerative joint disease, left hip.  POSTOPERATIVE DIAGNOSIS:  End-stage arthritis and degenerative joint disease, left hip.  PROCEDURE:  Left total hip arthroplasty through direct anterior approach.  IMPLANTS:  DePuy Sector Gription acetabular component size 52, size 36+4 neutral polyethylene liner, size 11 Corail femoral component with standard offset, size 36+1.5 metal hip ball.  SURGEON:  Vanita Panda. Magnus Ivan, M.D.  ASSISTANT:  Wende Neighbors, PA-C was present and assistance was needed throughout the case.  ANESTHESIA:  General.  BLOOD LOSS:  450 mL.  ANTIBIOTICS:  Vancomycin IV and Ancef IV.  COMPLICATIONS:  None.  INDICATIONS:  [name] is a 78 year old female with severe end-stage arthritis of her left hip.  One of her complicating feature is she has had infections in the past, and she has calcifications throughout the soft tissues of both her thighs and legs that we suspect was from potentially injecting drugs years ago.  Her x-rays and CT scan did confirm severe end-stage arthritis of her left hip and she ambulates with a cane, however, due to failure of conservative treatment and due to the impact on her activities of daily living and daily pain, she wished to proceed with a total hip arthroplasty.  The risks and benefits of this were explained to her in detail and she does wish to proceed with surgery.  DESCRIPTION OF PROCEDURE:  After informed consent was obtained, appropriate left hip was marked.  She was brought to the operating room. General anesthesia was obtained while she was  on her stretcher.  Foley catheter was placed and both feet were placed in traction boots.  She was then placed supine on the Hana fracture table with perineal post in place and both legs in inline skeletal traction, but no traction applied.  We then assessed the left hip under direct fluoroscopy to get her hip center to establish her leg lengths.  We then prepped the left hip with DuraPrep and sterile drapes.  A time-out was called.  She was identified as correct patient, correct left hip.  I then made an incision just distal and posterior to the anterior-superior iliac spine and dissected down the leg obliquely.  I dissected down to the tensor fascia lata then the tensor fascia was divided longitudinally.  I then proceeded with a direct anterior approach to the hip.  A Cobra retractor was placed around the lateral femoral neck and then up underneath the rectus femoris a medial retractor was placed.  I coagulated and cauterized the lateral femoral circumflex vessels.  I then opened the hip capsule and put the Cobra retractors in the hip capsule.  I made my femoral neck cut proximal to the lesser trochanter, and then completed this with an osteotome after using oscillating saw.  I placed a corkscrew guide in the femoral head and removed the femoral  head in its entirety.  I then cleaned the acetabular debris and placed a bent Hohmann medially and Cobra retractor laterally.  I began reaming from a size 42 reamer all the way up to a size 52 with the rest of the reamer placed under direct fluoroscopy as well as all reamers placed under direct visualization.  I then was able to gain my depth of reaming, my inclination and anteversion.  I then placed the real Sector Gription acetabular component from DePuy size 52, and knocked this into place. We were able to visualize this again directly and under fluoroscopy and I was pleased with the placement of the cup.  We did not need to place any screw  holes and I placed the apex hole eliminator guide and the real 36+ 4 neutral polyethylene liner.  Attention was then turned to the femur.  With all traction off, leg was externally rotated to 90 degrees, extended, and adducted.  Bent Hohmann was placed under the greater trochanter, and a Mueller was placed medially.  I then released the piriformis and was able to gain access to the femoral canal.  I used a box cutting guide to open the femoral canal and then began broaching from a size 8, broached all the way up to a size 11 and 11 filled the canal, was felt to be stable.  So I trialed a standard neck and a 36+ 1.5 hip ball.  We brought the leg back over and up and reduced this into the acetabulum.  It was stable with internal and external rotation with minimal shuck and leg lengths were equal.  We then redislocated the hip and brought her leg down over and removed the trial components.  We then placed the real femoral component which was a size 11 Corail and real 36+ 1.5 metal hip ball.  We reduced this back into the acetabulum and the hip was stable.  We used pulsatile lavage to lavage the joint with normal saline solution.  I closed the joint capsule with interrupted #1 Ethibond suture followed by a running 0 V-Loc suture in the tensor fascia, 2-0 Vicryl in the subcutaneous tissue, and staples on the skin. Well-padded sterile dressing was applied.  She was taken off the Hana table, awakened, extubated, and taken to the recovery room in a stable condition.  All final counts were correct.  There were no complications noted.  Again [name] PA-C was present and needed throughout the whole case to help the case to proceed.     Vanita Panda. Magnus Ivan, M.D.     CYB/MEDQ  D:  06/30/2012  T:  07/01/2012  Job:  [id number]

## 2012-07-02 LAB — CBC
HCT: 23.8 % — ABNORMAL LOW (ref 36.0–46.0)
HCT: 25.5 % — ABNORMAL LOW (ref 36.0–46.0)
Hemoglobin: 7.4 g/dL — ABNORMAL LOW (ref 12.0–15.0)
MCH: 27 pg (ref 26.0–34.0)
MCH: 27.6 pg (ref 26.0–34.0)
MCHC: 32.2 g/dL (ref 30.0–36.0)
MCV: 86.9 fL (ref 78.0–100.0)
RBC: 2.74 MIL/uL — ABNORMAL LOW (ref 3.87–5.11)
RDW: 16.6 % — ABNORMAL HIGH (ref 11.5–15.5)

## 2012-07-02 LAB — IRON AND TIBC
Saturation Ratios: 7 % — ABNORMAL LOW (ref 20–55)
UIBC: 143 ug/dL (ref 125–400)

## 2012-07-02 LAB — RETICULOCYTES
RBC.: 2.97 MIL/uL — ABNORMAL LOW (ref 3.87–5.11)
Retic Count, Absolute: 68.3 10*3/uL (ref 19.0–186.0)
Retic Ct Pct: 2.3 % (ref 0.4–3.1)

## 2012-07-02 LAB — FOLATE: Folate: 12.2 ng/mL

## 2012-07-02 LAB — VITAMIN B12: Vitamin B-12: 698 pg/mL (ref 211–911)

## 2012-07-02 MED ORDER — NAPHAZOLINE HCL 0.1 % OP SOLN
1.0000 [drp] | OPHTHALMIC | Status: DC | PRN
  Filled 2012-07-02: qty 15

## 2012-07-02 MED ORDER — NAPHAZOLINE-GLYCERIN 0.012-0.2 % OP SOLN
1.0000 [drp] | OPHTHALMIC | Status: DC | PRN
  Filled 2012-07-02: qty 15

## 2012-07-02 NOTE — Progress Notes (Signed)
Physical Therapy Treatment Patient Details Name: [name] MRN: [id number] DOB: 10/20/1934 Today's Date: 07/02/2012 Time: 7829-5621 PT Time Calculation (min): 47 min  PT Assessment / Plan / Recommendation Comments on Treatment Session  Pt reports dizziness following ambulation - BP 123/72 HR 84    Follow Up Recommendations  Home health PT;Skilled nursing facility    Barriers to Discharge        Equipment Recommendations  None recommended by PT    Recommendations for Other Services OT consult  Frequency 7X/week   Plan Discharge plan remains appropriate    Precautions / Restrictions Precautions Precautions: None Restrictions Weight Bearing Restrictions: No Other Position/Activity Restrictions: WBAT   Pertinent Vitals/Pain     Mobility  Bed Mobility Bed Mobility: Supine to Sit Supine to Sit: 4: Min assist;3: Mod assist Details for Bed Mobility Assistance: cues for sequence and use of UEs and R LE to self assist Transfers Transfers: Sit to Stand;Stand to Sit Sit to Stand: 1: +2 Total assist Sit to Stand: Patient Percentage: 70% Stand to Sit: 1: +2 Total assist Stand to Sit: Patient Percentage: 70% Details for Transfer Assistance: cues for use of UEs and for LE managment Ambulation/Gait Ambulation/Gait Assistance: 1: +2 Total assist Ambulation/Gait: Patient Percentage: 70% Ambulation Distance (Feet): 28 Feet Assistive device: Rolling walker Ambulation/Gait Assistance Details: cues for sequence, posture, and position from RW Gait Pattern: Step-to pattern;Decreased step length - left;Decreased step length - right;Decreased stance time - left Gait velocity: slow    Exercises Total Joint Exercises Ankle Circles/Pumps: AROM;20 reps;Supine;Both Gluteal Sets: AROM;10 reps;Supine;Both Short Arc Quad: AAROM;AROM;10 reps;Supine;Both Heel Slides: AAROM;20 reps;Supine;Left Hip ABduction/ADduction: AAROM;20 reps;Supine;Left   PT Diagnosis:    PT Problem List:   PT  Treatment Interventions:     PT Goals Acute Rehab PT Goals PT Goal Formulation: With patient Time For Goal Achievement: 07/05/12 Potential to Achieve Goals: Good Pt will go Supine/Side to Sit: with supervision PT Goal: Supine/Side to Sit - Progress: Progressing toward goal Pt will go Sit to Supine/Side: with supervision PT Goal: Sit to Supine/Side - Progress: Progressing toward goal Pt will go Sit to Stand: with supervision PT Goal: Sit to Stand - Progress: Progressing toward goal Pt will go Stand to Sit: with supervision PT Goal: Stand to Sit - Progress: Progressing toward goal Pt will Ambulate: 51 - 150 feet;with supervision;with rolling walker PT Goal: Ambulate - Progress: Progressing toward goal  Visit Information  Last PT Received On: 07/02/12 Assistance Needed: +2    Subjective Data  Subjective: I'm starting to think maybe I need to go somewhere before I go home Patient Stated Goal: Home with son   Cognition  Overall Cognitive Status: Appears within functional limits for tasks assessed/performed Arousal/Alertness: Awake/alert Orientation Level: Appears intact for tasks assessed Behavior During Session: [facility] for tasks performed    Balance     End of Session PT - End of Session Equipment Utilized During Treatment: Gait belt Activity Tolerance: Patient limited by fatigue;Patient limited by pain Patient left: with call bell/phone within reach;in bed;with family/visitor present Nurse Communication: Mobility status   GP     Rozanna Cormany 07/02/2012, 1:34 PM

## 2012-07-02 NOTE — Progress Notes (Addendum)
OT Cancellation Note  Treatment cancelled today due to medical issues with patient which prohibited therapy - Hbg 7.4.  Will defer eval today, and re-attempt Monday.  Did attempt to see pt. For eval after PT; however, pt. With complaint of feeling light headed and decreased vision in Lt. Eye (reports she feels she has a film over Lt. Eye).  RN notified  Jeani Hawking M 07/02/2012, 9:48 AM

## 2012-07-02 NOTE — Progress Notes (Signed)
Physical Therapy Treatment Patient Details Name: [name] MRN: [id number] DOB: [birth year] Today's Date: 07/02/2012 Time: 4098-1191 PT Time Calculation (min): 22 min  PT Assessment / Plan / Recommendation Comments on Treatment Session  no c/o dizziness this session    Follow Up Recommendations  Home health PT;Skilled nursing facility    Barriers to Discharge        Equipment Recommendations  None recommended by PT    Recommendations for Other Services OT consult  Frequency 7X/week   Plan Discharge plan remains appropriate    Precautions / Restrictions Precautions Precautions: None Restrictions Weight Bearing Restrictions: No Other Position/Activity Restrictions: WBAT   Pertinent Vitals/Pain     Mobility  Bed Mobility Bed Mobility: Sit to Supine Supine to Sit: 4: Min assist;3: Mod assist Sit to Supine: 4: Min assist;3: Mod assist Details for Bed Mobility Assistance: cues for sequence and use of UEs and R LE to self assist Transfers Transfers: Sit to Stand;Stand to Sit Sit to Stand: 1: +2 Total assist Sit to Stand: Patient Percentage: 70% Stand to Sit: 1: +2 Total assist Stand to Sit: Patient Percentage: 70% Details for Transfer Assistance: cues for use of UEs and for LE managment Ambulation/Gait Ambulation/Gait Assistance: 1: +2 Total assist Ambulation/Gait: Patient Percentage: 70% Ambulation Distance (Feet): 7 Feet Assistive device: Rolling walker Ambulation/Gait Assistance Details: cues for sequence, posture, and position from RW Gait Pattern: Step-to pattern;Decreased step length - left;Decreased step length - right;Decreased stance time - left Gait velocity: slow    Exercises Total Joint Exercises Ankle Circles/Pumps: AROM;20 reps;Supine;Both Gluteal Sets: AROM;10 reps;Supine;Both Short Arc Quad: AAROM;AROM;10 reps;Supine;Both Heel Slides: AAROM;20 reps;Supine;Left Hip ABduction/ADduction: AAROM;20 reps;Supine;Left   PT Diagnosis:    PT Problem  List:   PT Treatment Interventions:     PT Goals Acute Rehab PT Goals PT Goal Formulation: With patient Time For Goal Achievement: 07/05/12 Potential to Achieve Goals: Good Pt will go Supine/Side to Sit: with supervision PT Goal: Supine/Side to Sit - Progress: Progressing toward goal Pt will go Sit to Supine/Side: with supervision PT Goal: Sit to Supine/Side - Progress: Progressing toward goal Pt will go Sit to Stand: with supervision PT Goal: Sit to Stand - Progress: Progressing toward goal Pt will go Stand to Sit: with supervision PT Goal: Stand to Sit - Progress: Progressing toward goal Pt will Ambulate: 51 - 150 feet;with supervision;with rolling walker PT Goal: Ambulate - Progress: Progressing toward goal  Visit Information  Last PT Received On: 07/02/12 Assistance Needed: +2    Subjective Data  Subjective: I'm just so tired, I couldn't stay up as long as yesterday Patient Stated Goal: Home with son   Cognition  Overall Cognitive Status: Appears within functional limits for tasks assessed/performed Arousal/Alertness: Awake/alert Orientation Level: Appears intact for tasks assessed Behavior During Session: [facility] for tasks performed    Balance     End of Session PT - End of Session Equipment Utilized During Treatment: Gait belt Activity Tolerance: Patient limited by fatigue Patient left: with call bell/phone within reach;in bed;with family/visitor present Nurse Communication: Mobility status   GP     Revis Whalin 07/02/2012, 1:39 PM

## 2012-07-02 NOTE — Progress Notes (Signed)
Cm spoke with patient concerning dc planning. PT suggest HH vs SNF. Patient states son lives with her but works full time. She states son can take off work but she thinks he may not be able to assist with the amount of home care she may need. Patient states may possibly choose SNF. Weekend CSW notified of possible SNF placement for patient.   Leonie Green [phone]

## 2012-07-02 NOTE — Progress Notes (Signed)
Subjective: 2 Days Post-Op Procedure(s) (LRB): TOTAL HIP ARTHROPLASTY ANTERIOR APPROACH (Left) This patient's awake alert oriented x4. Some  concern about anemia. She has a history of anemia has been treated with IV infusion of iron by Dr. Morton Peters. Nelda Severe of [facility] family practice. This is usually done at short stay here at [facility] .  Objective: Vital signs in last 24 hours: Temp:  [98.6 F (37 C)-100.2 F (37.9 C)] 98.6 F (37 C) (07/28 1053) Pulse Rate:  [80-81] 80  (07/28 1053) Resp:  [14-16] 16  (07/28 1151) BP: (116-133)/(65-71) 133/65 mmHg (07/28 1053) SpO2:  [93 %-96 %] 95 % (07/28 1242)  Intake/Output from previous day: 07/27 0701 - 07/28 0700 In: 1140 [P.O.:240; I.V.:900] Out: 2000 [Urine:2000] Intake/Output this shift: Total I/O In: 120 [P.O.:120] Out: 575 [Urine:575]   Basename 07/02/12 0438 07/01/12 0442  HGB 7.4* 8.2*    Basename 07/02/12 0438 07/01/12 0442  WBC 8.8 6.5  RBC 2.74* 3.03*  HCT 23.8* 26.5*  PLT 213 240    Basename 07/01/12 0442  NA 134*  K 3.6  CL 104  CO2 22  BUN 9  CREATININE 0.72  GLUCOSE 115*  CALCIUM 8.7   No results found for this basename: LABPT:2,INR:2 in the last 72 hours  Neurologically intact ABD soft Neurovascular intact Sensation intact distally Intact pulses distally Dorsiflexion/Plantar flexion intact Incision: no drainage No cellulitis present  Assessment/Plan: 2 Days Post-Op Procedure(s) (LRB): TOTAL HIP ARTHROPLASTY ANTERIOR APPROACH (Left) Advance diet Up with therapy D/C IV fluids We'll check hemoglobin and hematocrit at 6 PM tonight and whether or not she will require either transfusion or iron IV treatment. Will and serum iron and transferritin level of the 6 PM draw. Jaimy Kliethermes E 07/02/2012, 2:33 PM

## 2012-07-03 LAB — CBC
Hemoglobin: 7.5 g/dL — ABNORMAL LOW (ref 12.0–15.0)
MCH: 27.2 pg (ref 26.0–34.0)
MCHC: 31.4 g/dL (ref 30.0–36.0)
Platelets: 244 10*3/uL (ref 150–400)

## 2012-07-03 LAB — PREPARE RBC (CROSSMATCH)

## 2012-07-03 MED ORDER — FUROSEMIDE 10 MG/ML IJ SOLN
20.0000 mg | Freq: Once | INTRAMUSCULAR | Status: AC
  Administered 2012-07-03: 20 mg via INTRAVENOUS
  Filled 2012-07-03: qty 2

## 2012-07-03 NOTE — Progress Notes (Signed)
Clinical Social Work Department CLINICAL SOCIAL WORK PLACEMENT NOTE 07/03/2012  Patient:  ROWYNN, MCWEENEY  Account Number:  [id number] Admit date:  06/30/2012  Clinical Social Worker:  Cori Razor, LCSW  Date/time:  07/03/2012 12:59 PM  Clinical Social Work is seeking post-discharge placement for this patient at the following level of care:   SKILLED NURSING   (*CSW will update this form in Epic as items are completed)   07/03/2012  Patient/family provided with [facility] Department of Clinical Social Work's list of facilities offering this level of care within the geographic area requested by the patient (or if unable, by the patient's family).  07/03/2012  Patient/family informed of their freedom to choose among providers that offer the needed level of care, that participate in Medicare, Medicaid or managed care program needed by the patient, have an available bed and are willing to accept the patient.    Patient/family informed of [facility]' ownership interest in [facility], as well as of the fact that they are under no obligation to receive care at this facility.  PASARR submitted to EDS on  PASARR number received from [employer] on 12/30/2011  FL2 transmitted to all facilities in geographic area requested by pt/family on  07/03/2012 FL2 transmitted to all facilities within larger geographic area on   Patient informed that his/her managed care company has contracts with or will negotiate with  certain facilities, including the following:     Patient/family informed of bed offers received:   Patient chooses bed at  Physician recommends and patient chooses bed at    Patient to be transferred to  on   Patient to be transferred to facility by   The following physician request were entered in Epic:   Additional Comments:  Cori Razor LCSW [phone]

## 2012-07-03 NOTE — Progress Notes (Signed)
Occupational Therapy Evaluation Patient Details Name: [name] MRN: [id number] DOB: Apr 13, 1934 Today's Date: 07/03/2012 Time: 4098-1191 OT Time Calculation (min): 29 min  OT Assessment / Plan / Recommendation Clinical Impression  This 78 year old female underwent L anterior direct THA.  PMH is significant for gastroesophageal CA.  She was mod I with ADLs prior to admission and now requires min A overall.  She is appropriate for skilled OT to increase independence with adls with min guard to supervision goals in acute    OT Assessment  Patient needs continued OT Services    Follow Up Recommendations  Skilled nursing facility    Barriers to Discharge      Equipment Recommendations  None recommended by PT;Defer to next venue    Recommendations for Other Services    Frequency  Min 2X/week    Precautions / Restrictions Precautions Precautions: Fall Restrictions Weight Bearing Restrictions: No Other Position/Activity Restrictions: WBAT   Pertinent Vitals/Pain 5/10 L hip    ADL  Eating/Feeding: Simulated;Independent Where Assessed - Eating/Feeding: Chair Grooming: Performed;Teeth care;Supervision/safety Where Assessed - Grooming: Supported standing Upper Body Bathing: Simulated;Set up Where Assessed - Upper Body Bathing: Unsupported sitting Lower Body Bathing: Simulated;Minimal assistance Where Assessed - Lower Body Bathing: Supported sit to stand Upper Body Dressing: Performed;Minimal assistance (iv) Where Assessed - Upper Body Dressing: Unsupported sitting Lower Body Dressing: Simulated;Minimal assistance Where Assessed - Lower Body Dressing: Supported sit to stand Toilet Transfer: Simulated;Minimal assistance (ambulating:  bed, hall, chair) Toileting - Architect and Hygiene: Simulated;Min guard Where Assessed - Toileting Clothing Manipulation and Hygiene: Sit to stand from 3-in-1 or toilet Transfers/Ambulation Related to ADLs: min A ambulation with RW:   tends to pick it up.  Used crutches x 2 years ADL Comments: has reacher and long sponge at home: likely will not need to use them.    OT Diagnosis: Generalized weakness  OT Problem List: Decreased strength;Decreased activity tolerance;Impaired balance (sitting and/or standing);Decreased knowledge of use of DME or AE;Pain OT Treatment Interventions: Self-care/ADL training;Patient/family education;Balance training;Therapeutic activities;DME and/or AE instruction   OT Goals Acute Rehab OT Goals OT Goal Formulation: With patient Time For Goal Achievement: 07/10/12 Potential to Achieve Goals: Good ADL Goals Pt Will Perform Lower Body Bathing: with supervision;Sit to stand from chair;with adaptive equipment ADL Goal: Lower Body Bathing - Progress: Goal set today Pt Will Perform Lower Body Dressing: with supervision;Sit to stand from chair;with adaptive equipment ADL Goal: Lower Body Dressing - Progress: Goal set today Pt Will Transfer to Toilet: with min assist;Ambulation;3-in-1 (min guard) ADL Goal: Toilet Transfer - Progress: Goal set today  Visit Information  Last OT Received On: 07/03/12 Assistance Needed: +1 PT/OT Co-Evaluation/Treatment: Yes Reason Eval/Treat Not Completed: Other (comment) (pt to receive blood)    Subjective Data  Subjective: I want this IV out Patient Stated Goal: go to rehab for a few days before home   Prior Functioning  Vision/Perception  Home Living Available Help at Discharge:  (plans stsnf) Prior Function Level of Independence: Independent with assistive device(s) Communication Communication: No difficulties Dominant Hand: Right      Cognition  Overall Cognitive Status: Appears within functional limits for tasks assessed/performed Arousal/Alertness: Awake/alert Orientation Level: Appears intact for tasks assessed Behavior During Session: [facility] for tasks performed    Extremity/Trunk Assessment Right Upper Extremity Assessment RUE  ROM/Strength/Tone: [facility] for tasks assessed (pt has trigger fingers:  2nd and 3rd) Left Upper Extremity Assessment LUE ROM/Strength/Tone: [facility] for tasks assessed   Mobility Bed Mobility Bed Mobility:  Supine to Sit;Sitting - Scoot to Edge of Bed Supine to Sit: 4: Min assist  Sitting - Scoot to [place] of Bed: 5: Supervision Details for Bed Mobility Assistance: Min assist for L LE out of bed with min cues for hand placement to self assist.  Transfers Sit to Stand: 4: Min assist  Stand to Sit: 4: Min guard;With upper extremity assist;With armrests;To chair/3-in-1 Details for Transfer Assistance: Cues for hand placement and LE management when sitting/standing.     Exercise    Balance Balance Balance Assessed: Yes Static Standing Balance Static Standing - Balance Support: Left upper extremity supported;During functional activity Static Standing - Level of Assistance: 5: Stand by assistance (min/guard) Static Standing - Comment/# of Minutes: Pt stood at sink x 3 mins to brush teeth with L UE supported on counter and also performed some dynamic standing balance to reach for cup, paper towel, etc.    End of Session OT - End of Session Activity Tolerance: Patient tolerated treatment well Patient left: in chair;with call bell/phone within reach  GO     [facility] 07/03/2012, 3:57 PM [name], OTR/L [phone] 07/03/2012

## 2012-07-03 NOTE — Progress Notes (Signed)
Subjective: 3 Days Post-Op Procedure(s) (LRB): TOTAL HIP ARTHROPLASTY ANTERIOR APPROACH (Left) Patient reports pain as moderate.  Acute blood loss anemia.  Objective: Vital signs in last 24 hours: Temp:  [98.3 F (36.8 C)-98.7 F (37.1 C)] 98.4 F (36.9 C) (07/29 0540) Pulse Rate:  [76-93] 76  (07/29 0540) Resp:  [16] 16  (07/29 0540) BP: (120-133)/(65-75) 120/71 mmHg (07/29 0540) SpO2:  [94 %-97 %] 97 % (07/29 0540)  Intake/Output from previous day: 07/28 0701 - 07/29 0700 In: 1200 [P.O.:960; I.V.:240] Out: 900 [Urine:900] Intake/Output this shift: Total I/O In: 480 [P.O.:240; I.V.:240] Out: 325 [Urine:325]   Basename 07/03/12 0353 07/02/12 1739 07/02/12 0438 07/01/12 0442  HGB 7.5* 8.2* 7.4* 8.2*    Basename 07/03/12 0353 07/02/12 1739  WBC 9.4 9.1  RBC 2.76* 2.97*  HCT 23.9* 25.5*  PLT 244 252    Basename 07/01/12 0442  NA 134*  K 3.6  CL 104  CO2 22  BUN 9  CREATININE 0.72  GLUCOSE 115*  CALCIUM 8.7   No results found for this basename: LABPT:2,INR:2 in the last 72 hours  Sensation intact distally Intact pulses distally Dorsiflexion/Plantar flexion intact Incision: scant drainage  Assessment/Plan: 3 Days Post-Op Procedure(s) (LRB): TOTAL HIP ARTHROPLASTY ANTERIOR APPROACH (Left) Up with therapy Transfuse 2 units PRBCs today.  Kathryne Hitch 07/03/2012, 6:38 AM

## 2012-07-03 NOTE — Plan of Care (Signed)
Problem: Consults Goal: Diagnosis- Total Joint Replacement Primary Total Hip     

## 2012-07-03 NOTE — Progress Notes (Signed)
Clinical Social Work Department BRIEF PSYCHOSOCIAL ASSESSMENT 07/03/2012  Patient:  [name], [name]     Account Number:  [id number]     Admit date:  06/30/2012  Clinical Social Worker:  Candie Chroman  Date/Time:  07/03/2012 12:45 PM  Referred by:  Physician  Date Referred:  06/30/2012 Referred for  SNF Placement   Other Referral:   Interview type:  Patient Other interview type:    PSYCHOSOCIAL DATA Living Status:  WITH ADULT CHILDREN Admitted from facility:   Level of care:   Primary support name:  Leonel Ramsay Primary support relationship to patient:  CHILD, ADULT Degree of support available:   supportive    CURRENT CONCERNS Current Concerns  Post-Acute Placement   Other Concerns:    SOCIAL WORK ASSESSMENT / PLAN Pt is a 78 yr female living at home prior to hospitalization. CSW met wiht pt to assist with d/c planning. PT has recommended ST SNF following hospitalization. Pt feels this would be helpful. SNF search has been initiated and bed offers will be provided this afternoon.   Assessment/plan status:  Psychosocial Support/Ongoing Assessment of Needs Other assessment/ plan:   Information/referral to community resources:   SNF list provided    PATIENT'S/FAMILY'S RESPONSE TO PLAN OF CARE: Pt states her son would like her to return home with his support following hospital d/c. Pt prefers to spend a short time in a SNF prior to returning home.    Cori Razor LCSW [phone]

## 2012-07-03 NOTE — Progress Notes (Signed)
Physical Therapy Treatment Patient Details Name: [name] MRN: [id number] DOB: July 05, 1934 Today's Date: 07/03/2012 Time: 4098-1191 PT Time Calculation (min): 27 min  PT Assessment / Plan / Recommendation Comments on Treatment Session  Pt doing better after receiving 2 units of blood in am.  No c/o dizziness/lightheadedness during session.  Pt states she wants to go tomorrow.     Follow Up Recommendations  Home health PT;Skilled nursing facility    Barriers to Discharge        Equipment Recommendations  None recommended by PT    Recommendations for Other Services    Frequency 7X/week   Plan Discharge plan remains appropriate    Precautions / Restrictions Precautions Precautions: Fall Restrictions Weight Bearing Restrictions: No Other Position/Activity Restrictions: WBAT   Pertinent Vitals/Pain 5/10 pain with movement. Applied ice pack    Mobility  Bed Mobility Bed Mobility: Supine to Sit;Sitting - Scoot to Edge of Bed Supine to Sit: 4: Min assist;HOB elevated Sitting - Scoot to [place] of Bed: 5: Supervision Details for Bed Mobility Assistance: Min assist for L LE out of bed with min cues for hand placement to self assist.  Transfers Transfers: Sit to Stand;Stand to Sit Sit to Stand: 4: Min assist;From elevated surface;With upper extremity assist;From bed Stand to Sit: 4: Min guard;With upper extremity assist;With armrests;To chair/3-in-1 Details for Transfer Assistance: Cues for hand placement and LE management when sitting/standing.   Ambulation/Gait Ambulation/Gait Assistance: 4: Min assist Ambulation Distance (Feet): 70 Feet Assistive device: Rolling walker Ambulation/Gait Assistance Details: Min cues for sequencing/technique and for upright posture.  Gait Pattern: Step-to pattern;Decreased step length - left;Decreased step length - right;Decreased stance time - left Gait velocity: slow    Exercises     PT Diagnosis:    PT Problem List:   PT Treatment  Interventions:     PT Goals Acute Rehab PT Goals PT Goal Formulation: With patient Time For Goal Achievement: 07/05/12 Potential to Achieve Goals: Good Pt will go Supine/Side to Sit: with supervision PT Goal: Supine/Side to Sit - Progress: Progressing toward goal Pt will go Sit to Stand: with supervision PT Goal: Sit to Stand - Progress: Progressing toward goal Pt will go Stand to Sit: with supervision PT Goal: Stand to Sit - Progress: Progressing toward goal Pt will Ambulate: 51 - 150 feet;with supervision;with rolling walker PT Goal: Ambulate - Progress: Progressing toward goal  Visit Information  Last PT Received On: 07/03/12 Assistance Needed: +1 PT/OT Co-Evaluation/Treatment: Yes    Subjective Data  Subjective: I want to get out of here tomorrow Patient Stated Goal: Home with son   Cognition  Overall Cognitive Status: Appears within functional limits for tasks assessed/performed Arousal/Alertness: Awake/alert Orientation Level: Appears intact for tasks assessed Behavior During Session: [facility] for tasks performed    Balance  Balance Balance Assessed: Yes Static Standing Balance Static Standing - Balance Support: Left upper extremity supported;During functional activity Static Standing - Level of Assistance: 5: Stand by assistance (min/guard) Static Standing - Comment/# of Minutes: Pt stood at sink x 3 mins to brush teeth with L UE supported on counter and also performed some dynamic standing balance to reach for cup, paper towel, etc.    End of Session PT - End of Session Equipment Utilized During Treatment: Gait belt Activity Tolerance: Patient tolerated treatment well;Patient limited by pain Patient left: in chair;with call bell/phone within reach Nurse Communication: Mobility status   GP     Page, Meribeth Mattes 07/03/2012, 3:51 PM

## 2012-07-03 NOTE — Plan of Care (Signed)
Problem: Phase III Progression Outcomes Goal: Pain controlled on oral analgesia Outcome: Progressing Pain managed with ultram during the day and occasionally Vicodan when patient is moving around.

## 2012-07-03 NOTE — Progress Notes (Signed)
Occupational Therapy Note Note pt receiving 2 units of blood today. Will check back later today as able or in am. Judithann Sauger OTR/L [phone] 07/03/2012

## 2012-07-03 NOTE — Progress Notes (Signed)
FL2 in shadow chart for MD signature. Pt has SNF bed offers and is interested in [facility]. She will discuss this option with her son. CSW will meet with pt in am to continue assisting with d/c planning.  Asher Muir Sascha Palma LCSW [phone]

## 2012-07-04 LAB — CBC
HCT: 30.4 % — ABNORMAL LOW (ref 36.0–46.0)
MCHC: 32.2 g/dL (ref 30.0–36.0)
Platelets: 235 10*3/uL (ref 150–400)
RDW: 15.6 % — ABNORMAL HIGH (ref 11.5–15.5)
WBC: 7.5 10*3/uL (ref 4.0–10.5)

## 2012-07-04 LAB — TYPE AND SCREEN
Antibody Screen: NEGATIVE
Unit division: 0
Unit division: 0
Unit division: 0

## 2012-07-04 MED ORDER — ASPIRIN 325 MG PO TBEC: 325.0000 mg | DELAYED_RELEASE_TABLET | Freq: Two times a day (BID) | ORAL | Status: AC

## 2012-07-04 MED ORDER — TRAMADOL HCL 50 MG PO TABS: 50.0000 mg | ORAL_TABLET | Freq: Four times a day (QID) | ORAL | Status: AC | PRN

## 2012-07-04 MED ORDER — HYDROCODONE-ACETAMINOPHEN 5-325 MG PO TABS: 1.0000 | ORAL_TABLET | ORAL | Status: AC | PRN

## 2012-07-04 NOTE — Progress Notes (Signed)
Occupational Therapy Treatment Patient Details Name: [name] MRN: [id number] DOB: May 28, 1934 Today's Date: 07/04/2012 Time: 0930-1004 OT Time Calculation (min): 34 min  OT Assessment / Plan / Recommendation Comments on Treatment Session      Follow Up Recommendations  Skilled nursing facility    Barriers to Discharge       Equipment Recommendations       Recommendations for Other Services    Frequency     Plan      Precautions / Restrictions Precautions Precautions: Fall Restrictions Other Position/Activity Restrictions: WBAT   Pertinent Vitals/Pain Hip OK--no interventions needed.  Repositioned up in chair    ADL  Lower Body Bathing: Performed;Minimal assistance Where Assessed - Lower Body Bathing: Supported sit to stand Lower Body Dressing: Performed;Minimal assistance (for pants only) Where Assessed - Lower Body Dressing: Supported sit to stand Toilet Transfer: Simulated;Min guard (bed to chair) Toilet Transfer Method: Stand pivot Transfers/Ambulation Related to ADLs: ambulated to sink with min guard A ADL Comments: Fatiqued with ADL but willing to do PT next.  Pt donned own clothes; plans stsnf today    OT Diagnosis:    OT Problem List:   OT Treatment Interventions:     OT Goals ADL Goals Pt Will Perform Lower Body Bathing: with supervision;Sit to stand from chair;with adaptive equipment ADL Goal: Lower Body Bathing - Progress: Progressing toward goals Pt Will Perform Lower Body Dressing: with supervision;Sit to stand from chair;with adaptive equipment ADL Goal: Lower Body Dressing - Progress: Progressing toward goals Pt Will Transfer to Toilet: with min assist;Ambulation;3-in-1 ADL Goal: Toilet Transfer - Progress: Goal set today  Visit Information  Last OT Received On: 07/04/12 Assistance Needed: +1    Subjective Data      Prior Functioning       Cognition  Overall Cognitive Status: Appears within functional limits for tasks  assessed/performed Behavior During Session: [facility] for tasks performed    Mobility Bed Mobility Supine to Sit: 4: Min assist Transfers Sit to Stand: 4: Min guard   Exercises    Balance    End of Session OT - End of Session Activity Tolerance: Patient tolerated treatment well Patient left: in chair;with call bell/phone within reach  GO     [name] 07/04/2012, 10:09 AM [name], OTR/L [phone] 07/04/2012

## 2012-07-04 NOTE — Progress Notes (Signed)
Subjective: 4 Days Post-Op Procedure(s) (LRB): TOTAL HIP ARTHROPLASTY ANTERIOR APPROACH (Left) Patient reports pain as mild.    Objective: Vital signs in last 24 hours: Temp:  [97.8 F (36.6 C)-98.9 F (37.2 C)] 97.8 F (36.6 C) (07/30 0604) Pulse Rate:  [70-89] 70  (07/30 0604) Resp:  [16-18] 16  (07/30 0604) BP: (123-166)/(56-87) 149/72 mmHg (07/30 0604) SpO2:  [93 %-100 %] 95 % (07/30 0604)  Intake/Output from previous day: 07/29 0701 - 07/30 0700 In: 953.3 [P.O.:240; I.V.:129.3; Blood:584] Out: 1050 [Urine:1050] Intake/Output this shift:     Basename 07/04/12 0412 07/03/12 0353 07/02/12 1739 07/02/12 0438  HGB 9.8* 7.5* 8.2* 7.4*    Basename 07/04/12 0412 07/03/12 0353  WBC 7.5 9.4  RBC 3.57* 2.76*  HCT 30.4* 23.9*  PLT 235 244   No results found for this basename: NA:2,K:2,CL:2,CO2:2,BUN:2,CREATININE:2,GLUCOSE:2,CALCIUM:2 in the last 72 hours No results found for this basename: LABPT:2,INR:2 in the last 72 hours  Sensation intact distally Intact pulses distally Dorsiflexion/Plantar flexion intact Incision: scant drainage  Assessment/Plan: 4 Days Post-Op Procedure(s) (LRB): TOTAL HIP ARTHROPLASTY ANTERIOR APPROACH (Left) Discharge to SNF  [facility] 07/04/2012, 6:44 AM

## 2012-07-04 NOTE — Discharge Summary (Signed)
Patient ID: [name] MRN: [id number] DOB/AGE: March 21, 1934 78 y.o.  Admit date: 06/30/2012 Discharge date: 07/04/2012  Admission Diagnoses:  Principal Problem:  *Degenerative arthritis of hip   Discharge Diagnoses:  Same  Past Medical History  Diagnosis Date  . Pernicious anemia   . Osteoarthritis   . Fibromyalgia   . Skin lesion     chronic calcific cutaneous lesions  . HX of multiple bleeding ulcers 09/01/2011  . Hypertension     takes Atenolol daily  . Emphysema   . Bronchitis     hx  . Adenocarcinoma of gastroesophageal junction   . Dizziness     d/t crystals in ears that "roll around"and can't get them out  . Back pain   . Osteoporosis   . Scoliosis   . Psoriasis   . H/O hiatal hernia   . GERD (gastroesophageal reflux disease)     takes Aciphex daily  . Gastric ulcer   . Constipation     since Chemo and Radiation;finished up in Nov 2012  . Diverticulosis   . History of colonic polyps   . Shingles     broke out 2wks ago;has been on meds and to finish up tomorrow  . Kidney stone 25+yrs ago  . Pernicious anemia   . Vitamin B deficiency   . Complication of anesthesia     shortness of breath and pain with gas   . COPD (chronic obstructive pulmonary disease)   . Shortness of breath     with exertion   . Blood transfusion     Surgeries: Procedure(s): TOTAL HIP ARTHROPLASTY ANTERIOR APPROACH on 06/30/2012   Consultants:    Discharged Condition: Improved  Hospital Course: [name] is an 79 y.o. female who was admitted 06/30/2012 for operative treatment ofDegenerative arthritis of hip. Patient has severe unremitting pain that affects sleep, daily activities, and work/hobbies. After pre-op clearance the patient was taken to the operating room on 06/30/2012 and underwent  Procedure(s): TOTAL HIP ARTHROPLASTY ANTERIOR APPROACH.    Patient was given perioperative antibiotics: Anti-infectives     Start     Dose/Rate Route Frequency Ordered Stop   07/01/12 1700   vancomycin (VANCOCIN) IVPB 1000 mg/200 mL premix        1,000 mg 200 mL/hr over 60 Minutes Intravenous  Once 07/01/12 1553 07/01/12 1813   06/30/12 1400   ceFAZolin (ANCEF) IVPB 1 g/50 mL premix        1 g 100 mL/hr over 30 Minutes Intravenous Every 6 hours 06/30/12 1119 06/30/12 2003   06/30/12 0535   vancomycin (VANCOCIN) IVPB 1000 mg/200 mL premix  Status:  Discontinued        1,000 mg 200 mL/hr over 60 Minutes Intravenous 60 min pre-op 06/30/12 0535 06/30/12 1058   06/30/12 0535   ceFAZolin (ANCEF) IVPB 1 g/50 mL premix  Status:  Discontinued        1 g 100 mL/hr over 30 Minutes Intravenous 60 min pre-op 06/30/12 0535 06/30/12 1058           Patient was given sequential compression devices, early ambulation, and chemoprophylaxis to prevent DVT.  Patient benefited maximally from hospital stay and there were no complications.    Recent vital signs: Patient Vitals for the past 24 hrs:  BP Temp Temp src Pulse Resp SpO2  07/04/12 0604 149/72 mmHg 97.8 F (36.6 C) Oral 70  16  95 %  07/03/12 2154 146/72 mmHg 98.3 F (36.8 C) Oral 72  16  94 %  07/26/12 2119 - - - 77  - -  07-26-12 1430 151/78 mmHg 98.8 F (37.1 C) Oral 74  16  -  07/26/12 1325 158/62 mmHg 98.4 F (36.9 C) Oral 89  16  -  July 26, 2012 1225 166/77 mmHg 98.7 F (37.1 C) Oral 80  16  -  July 26, 2012 1208 136/66 mmHg 98.9 F (37.2 C) Oral 82  16  -  July 26, 2012 1115 154/79 mmHg 98.9 F (37.2 C) Oral 81  16  -  2012-07-26 1045 141/87 mmHg 98.2 F (36.8 C) Oral 82  18  -  07/26/2012 0945 159/77 mmHg 98.7 F (37.1 C) Oral 88  18  -  07-26-2012 0845 123/56 mmHg 98.1 F (36.7 C) Axillary 84  16  100 %  07/26/12 0844 - - - - - 93 %  Jul 26, 2012 0815 158/72 mmHg 98.2 F (36.8 C) Oral 82  16  -     Recent laboratory studies:  Basename 07/04/12 0412 07-26-2012 0353  WBC 7.5 9.4  HGB 9.8* 7.5*  HCT 30.4* 23.9*  PLT 235 244  NA -- --  K -- --  CL -- --  CO2 -- --  BUN -- --  CREATININE -- --  GLUCOSE -- --    INR -- --  CALCIUM -- --     Discharge Medications:   Medication List  As of 07/04/2012  6:47 AM   TAKE these medications         acetaminophen 500 MG tablet   Commonly known as: TYLENOL   Take 1,000 mg by mouth every 6 (six) hours as needed. PAIN      aspirin 325 MG EC tablet   Take 1 tablet (325 mg total) by mouth 2 (two) times daily.      atenolol 50 MG tablet   Commonly known as: TENORMIN   Take 50 mg by mouth 2 (two) times daily.      diphenhydrAMINE 25 MG tablet   Commonly known as: BENADRYL   Take 25 mg by mouth See admin instructions. TAKE 1 TABLET 30 MINUTES PRIOR TO HYDROCODONE      esomeprazole 40 MG capsule   Commonly known as: NEXIUM   Take 40 mg by mouth daily before breakfast.      HYDROcodone-acetaminophen 5-325 MG per tablet   Commonly known as: NORCO/VICODIN   Take 1-2 tablets by mouth every 4 (four) hours as needed.      lisinopril 40 MG tablet   Commonly known as: PRINIVIL,ZESTRIL   Take 40 mg by mouth every morning.      OVER THE COUNTER MEDICATION   Take 1 tablet by mouth 2 (two) times daily. [facility] [employer] WORT      predniSONE 1 MG tablet   Commonly known as: DELTASONE   Take 2 mg by mouth every morning. 2 TABLETS A DAY      raloxifene 60 MG tablet   Commonly known as: EVISTA   Take 60 mg by mouth every morning.      tiotropium 18 MCG inhalation capsule   Commonly known as: SPIRIVA   Place 18 mcg into inhaler and inhale every morning.      tiZANidine 4 MG tablet   Commonly known as: ZANAFLEX   Take 2 mg by mouth every 6 (six) hours as needed. TAKES 1/2 TABLET-MUSCLE STIFFNESS      traMADol 50 MG tablet   Commonly known as: ULTRAM   Take 1-2 tablets (50-100 mg total) by mouth every  6 (six) hours as needed.            Diagnostic Studies: Dg Hip Complete Left  06/30/2012  *RADIOLOGY REPORT*  Clinical Data: Intraoperative spot views obtained during left hip arthroplasty  LEFT HIP - COMPLETE 2+ VIEW  Comparison: Preoperative CT scan  04/03/2012  Findings: A total of two intraoperative spot views of the left hip and inferior midline pelvis are submitted for radiographic interpretation.  Interval surgical changes of left hip arthroplasty.  The femoral head component appears well seated within the acetabular component.  Evaluation of the underlying osseous structures is somewhat limited by extensive soft tissue calcifications both the anterior, and posterior to the proximal femur.  No definite acute hardware complication.  IMPRESSION:  Left hip arthroplasty without evidence of immediate hardware complication.  Evaluation slightly limited by extensive soft tissue calcifications anterior and posterior to the proximal femur.  Intraoperative spot films obtained at the time of left hip arthroplasty.  Images only submitted for radiographic interpretation.  Please see operative note for fluoroscopy time, and further detail.  Original Report Authenticated By: Alvino Blood Pelvis Portable  06/30/2012  *RADIOLOGY REPORT*  Clinical Data: Postop.  Left hip pain.  PORTABLE PELVIS,PORTABLE LEFT HIP - 1 VIEW  Comparison: CT left hip 04/03/2012  Findings: Patient is status post left total hip arthroplasty via anterior approach.  No adverse features.  Prior right total hip replacement.  Extensive soft tissue calcification stable.  IMPRESSION: Satisfactory postoperative  appearance status post left T H R.  Original Report Authenticated By: Elsie Stain, M.D.   Dg Hip Portable 1 View Left  06/30/2012  *RADIOLOGY REPORT*  Clinical Data: Postop.  Left hip pain.  PORTABLE PELVIS,PORTABLE LEFT HIP - 1 VIEW  Comparison: CT left hip 04/03/2012  Findings: Patient is status post left total hip arthroplasty via anterior approach.  No adverse features.  Prior right total hip replacement.  Extensive soft tissue calcification stable.  IMPRESSION: Satisfactory postoperative  appearance status post left T H R.  Original Report Authenticated By: Elsie Stain, M.D.   Dg  C-arm 61-120 Min-no Report  06/30/2012  CLINICAL DATA: perioperative use   C-ARM 61-120 MINUTES  Fluoroscopy was utilized by the requesting physician.  No radiographic  interpretation.      Disposition: to SNF  Discharge Orders    Future Appointments: Provider: Department: Dept Phone: Center:   08/23/2012 9:45 AM Rana Snare, NP [facility] [phone] None     Future Orders Please Complete By Expires   Diet - low sodium heart healthy      Call MD / Call 911      Comments:   If you experience chest pain or shortness of breath, CALL 911 and be transported to the hospital emergency room.  If you develope a fever above 101 F, pus (white drainage) or increased drainage or redness at the wound, or calf pain, call your surgeon's office.   Constipation Prevention      Comments:   Drink plenty of fluids.  Prune juice may be helpful.  You may use a stool softener, such as Colace (over the counter) 100 mg twice a day.  Use MiraLax (over the counter) for constipation as needed.   Increase activity slowly as tolerated      Discharge instructions      Comments:   Increase activities as comfort allows. New dry dressing left hip incision daily. Can get incision wet in the shower. Weight-bearing as tolerated; no hip  precautions. Follow-up in 2 weeks at [facility] [phone])   Discharge patient            Signed: Kathryne Hitch 07/04/2012, 6:47 AM

## 2012-07-04 NOTE — Care Management Note (Signed)
    Page 1 of 2   07/04/2012     1:51:18 PM   CARE MANAGEMENT NOTE 07/04/2012  Patient:  [name], [name]   Account Number:  [id number]  Date Initiated:  07/03/2012  Documentation initiated by:  Colleen Can  Subjective/Objective Assessment:   dx endstage osteoarthritis left hip; total hip replacemnt     Action/Plan:   SNF rehab   Anticipated DC Date:  07/04/2012   Anticipated DC Plan:  SKILLED NURSING FACILITY  In-house referral  Clinical Social Worker      DC Planning Services  CM consult      [facility] Choice  NA   Choice offered to / List presented to:  NA   DME arranged  NA      DME agency  NA     HH arranged  NA      HH agency  NA   Status of service:  Completed, signed off Medicare Important Message given?   (If response is "NO", the following Medicare IM given date fields will be blank) Date Medicare IM given:   Date Additional Medicare IM given:    Discharge Disposition:  SKILLED NURSING FACILITY  Per UR Regulation:    If discussed at Long Length of Stay Meetings, dates discussed:    Comments:

## 2012-07-04 NOTE — Progress Notes (Signed)
Physical Therapy Treatment Patient Details Name: [name] MRN: [id number] DOB: 1934/05/12 Today's Date: 07/04/2012 Time: 7846-9629 PT Time Calculation (min): 20 min  PT Assessment / Plan / Recommendation Comments on Treatment Session       Follow Up Recommendations  Home health PT;Skilled nursing facility    Barriers to Discharge        Equipment Recommendations  None recommended by PT;Defer to next venue    Recommendations for Other Services OT consult  Frequency 7X/week   Plan Discharge plan remains appropriate    Precautions / Restrictions Precautions Precautions: Fall Restrictions Weight Bearing Restrictions: No Other Position/Activity Restrictions: WBAT   Pertinent Vitals/Pain Min c/o pain; premedicated    Mobility  Bed Mobility Supine to Sit: 4: Min assist Transfers Transfers: Sit to Stand;Stand to Sit Sit to Stand: 4: Min guard Stand to Sit: 4: Min guard;With upper extremity assist;With armrests;To chair/3-in-1 Details for Transfer Assistance: Cues for hand placement and LE management when sitting/standing.   Ambulation/Gait Ambulation/Gait Assistance: 4: Min guard Ambulation Distance (Feet): 70 Feet (x2) Assistive device: Rolling walker Ambulation/Gait Assistance Details: min cues for posture and position from RW    Exercises     PT Diagnosis:    PT Problem List:   PT Treatment Interventions:     PT Goals Acute Rehab PT Goals PT Goal Formulation: With patient Time For Goal Achievement: 07/05/12 Potential to Achieve Goals: Good Pt will go Supine/Side to Sit: with supervision PT Goal: Supine/Side to Sit - Progress: Progressing toward goal Pt will go Sit to Supine/Side: with supervision PT Goal: Sit to Supine/Side - Progress: Progressing toward goal Pt will go Sit to Stand: with supervision PT Goal: Sit to Stand - Progress: Progressing toward goal Pt will go Stand to Sit: with supervision PT Goal: Stand to Sit - Progress: Progressing toward  goal Pt will Ambulate: 51 - 150 feet;with supervision;with rolling walker PT Goal: Ambulate - Progress: Progressing toward goal  Visit Information  Last PT Received On: 07/04/12 Assistance Needed: +1    Subjective Data  Subjective:  I'm leaving today but I'm not going home Patient Stated Goal: Home with son   Cognition  Overall Cognitive Status: Appears within functional limits for tasks assessed/performed Arousal/Alertness: Awake/alert Orientation Level: Appears intact for tasks assessed Behavior During Session: [facility] for tasks performed    Balance     End of Session PT - End of Session Activity Tolerance: Patient tolerated treatment well Patient left: in chair;with call bell/phone within reach Nurse Communication: Mobility status   GP     [name] 07/04/2012, 11:59 AM

## 2012-07-05 ENCOUNTER — Encounter (HOSPITAL_COMMUNITY): Payer: Self-pay | Admitting: Orthopaedic Surgery

## 2012-07-05 NOTE — Progress Notes (Signed)
Clinical Social Work Department CLINICAL SOCIAL WORK PLACEMENT NOTE 07/05/2012  Patient:  [name], [name]  Account Number:  [id number] Admit date:  06/30/2012  Clinical Social Worker:  Cori Razor, LCSW  Date/time:  07/03/2012 12:59 PM  Clinical Social Work is seeking post-discharge placement for this patient at the following level of care:   SKILLED NURSING   (*CSW will update this form in Epic as items are completed)   07/03/2012  Patient/family provided with [facility] Department of Clinical Social Work's list of facilities offering this level of care within the geographic area requested by the patient (or if unable, by the patient's family).  07/03/2012  Patient/family informed of their freedom to choose among providers that offer the needed level of care, that participate in Medicare, Medicaid or managed care program needed by the patient, have an available bed and are willing to accept the patient.    Patient/family informed of [facility]' ownership interest in [facility], as well as of the fact that they are under no obligation to receive care at this facility.  PASARR submitted to EDS on  PASARR number received from E[employer] on 12/30/2011  FL2 transmitted to all facilities in geographic area requested by pt/family on  07/03/2012 FL2 transmitted to all facilities within larger geographic area on   Patient informed that his/her managed care company has contracts with or will negotiate with  certain facilities, including the following:     Patient/family informed of bed offers received:  07/03/2012 Patient chooses bed at [facility] [place] Physician recommends and patient chooses bed at    Patient to be transferred to [facility] [place] on  07/04/2012 Patient to be transferred to facility by [facility]  The following physician request were entered in Epic:   Additional Comments:  Cori Razor LCSW [phone]

## 2012-08-23 ENCOUNTER — Ambulatory Visit (HOSPITAL_BASED_OUTPATIENT_CLINIC_OR_DEPARTMENT_OTHER): Payer: Medicare Other | Admitting: Nurse Practitioner

## 2012-08-23 ENCOUNTER — Telehealth: Payer: Self-pay | Admitting: Oncology

## 2012-08-23 VITALS — BP 175/85 | HR 72 | Temp 98.0°F | Resp 20 | Ht 63.0 in | Wt 132.2 lb

## 2012-08-23 DIAGNOSIS — M1990 Unspecified osteoarthritis, unspecified site: Secondary | ICD-10-CM

## 2012-08-23 DIAGNOSIS — R1310 Dysphagia, unspecified: Secondary | ICD-10-CM

## 2012-08-23 DIAGNOSIS — C160 Malignant neoplasm of cardia: Secondary | ICD-10-CM

## 2012-08-23 DIAGNOSIS — D649 Anemia, unspecified: Secondary | ICD-10-CM

## 2012-08-23 NOTE — Telephone Encounter (Signed)
appts made and printed for p t ,pt will get a recall/appt letter in a few months per dawne l at [facility]    aom

## 2012-08-23 NOTE — Progress Notes (Signed)
OFFICE PROGRESS NOTE  Interval history:  [name] is a 78 year old woman diagnosed with adenocarcinoma of the gastroesophageal junction August 2012. Endoscopic evaluation on 07/27/2011 confirmed a nonobstructing gastroesophageal junction mass. Staging CT scan showed no evidence for distant metastatic disease. Staging PET scan 08/20/2011 revealed hypermetabolic activity at the distal esophagus and no evidence for metastatic disease. She completed radiation 08/26/2011 through 10/04/2011 and weekly Taxol/carboplatin chemotherapy 08/31/2011 through 10/04/2011. Restaging PET scan 11/05/2011 confirmed a decrease in the FDG activity of the distal esophagus with no evidence for metastatic disease. She underwent an esophagogastrectomy procedure by Dr. Edwyna Shell on 12/20/2011. Pathology showed invasive adenocarcinoma, moderately differentiated, scattered foci, with the largest spanning 0.26 cm. Adenocarcinoma involved the muscularis mucosa. The surgical resection margins were negative. There was no evidence of carcinoma in 9 of 9 lymph nodes. A subserosal gastrointestinal stromal tumor, low-grade, spanning 0.8 cm, was noted.   [name] reports persistent dysphagia. She notes that solids become "stuck". She is tolerating liquids without difficulty. She has a good appetite. She has chronic pain associated with fibromyalgia. She recently developed an abdominal hernia after a coughing episode. She intermittently notes mild discomfort associated with the hernia. She recently underwent a left upper placement. She notes improvement in the left hip pain.   Objective: Blood pressure 175/85, pulse 72, temperature 98 F (36.7 C), temperature source Oral, resp. rate 20, height 5\' 3"  (1.6 m), weight 132 lb 3.2 oz (59.966 kg).  Oropharynx is without thrush or ulceration. No palpable cervical, supraclavicular or axillary lymph nodes. Lungs are clear. Regular cardiac rhythm. Abdomen is soft and nontender. No hepatomegaly.  Large abdominal wall/incisional hernia. The hernia is soft and reducible. Extremities are without edema.  Lab Results: Lab Results  Component Value Date   WBC 7.5 07/04/2012   HGB 9.8* 07/04/2012   HCT 30.4* 07/04/2012   MCV 85.2 07/04/2012   PLT 235 07/04/2012    Chemistry:    Chemistry      Component Value Date/Time   NA 134* 07/01/2012 0442   K 3.6 07/01/2012 0442   CL 104 07/01/2012 0442   CO2 22 07/01/2012 0442   BUN 9 07/01/2012 0442   CREATININE 0.72 07/01/2012 0442      Component Value Date/Time   CALCIUM 8.7 07/01/2012 0442   ALKPHOS 68 01/25/2012 0703   AST 14 01/25/2012 0703   ALT 6 01/25/2012 0703   BILITOT 0.2* 01/25/2012 0703       Studies/Results: No results found.  Medications: I have reviewed the patient's current medications.  Assessment/Plan:  #1 Adenocarcinoma of the gastroesophageal junction status post endoscopic evaluation 07/27/2011 confirming a nonobstructing gastroesophageal junction mass. Staging CT scan showed no evidence for distant metastatic disease. Staging PET scan 08/20/2011 revealed hypermetabolic activity at the distal esophagus and no evidence for metastatic disease. She completed radiation 08/26/2011 through 10/04/2011 and weekly Taxol/carboplatin chemotherapy 08/31/2011 through 10/04/2011. -restaging PET scan 11/05/2011 confirmed a decrease in the FDG activity at the distal esophagus with no evidence for metastatic disease. Status post esophagogastrectomy 12/20/2011 with pathology showing invasive adenocarcinoma, moderately differentiated, scattered foci, the largest spanning 0.26 cm; adenocarcinoma involved the muscularis mucosa; the surgical resection margins were negative; no evidence of carcinoma in 9 of 9 lymph nodes. #2 Solid/liquid dysphagia, subxiphoid discomfort, and weight loss secondary to esophagus cancer. There was initial improvement in the dysphagia. She has recurrent dysphagia. #3 Subserosal gastrointestinal stromal tumor, low-grade,  spanning 0.8 cm noted on pathology 12/20/2011. #4 Status post left total hip replacement 06/30/2012. #5 Odynophagia  secondary to radiation esophagitis. Resolved. #6 Chronic anemia with a history of chronic gastrointestinal blood loss.  #7 Osteoarthritis.  #8 "Pernicious" anemia.  #9 Fibromyalgia.  #10 Chronic "calcific" cutaneous lesions.  #11 Fever and erythema with associated tenderness involving one of the calcific cutaneous lesions at the left anterior lateral leg on 09/21/2011. Improved following Keflex therapy.  #12 Multiple medication allergies.  #13 Hypertension. #14 Superficial phlebitis right arm on 10/18/2011.  #15 Abdominal wall/incisional hernia.  Disposition-[name] is in clinical remission from the esophagus cancer. We are referring her for a barium swallow to evaluate the dysphagia. Dr. Truett Perna also recommended a referral to Dr. Tyrone Sage for routine followup. She will return for a followup visit here in 3 months. She will contact the office in the interim with any problems.  Patient seen with Dr. Truett Perna.    Lonna Cobb ANP/GNP-BC

## 2012-08-28 ENCOUNTER — Other Ambulatory Visit (HOSPITAL_COMMUNITY): Payer: Medicare Other

## 2012-09-15 ENCOUNTER — Other Ambulatory Visit: Payer: Self-pay | Admitting: Nurse Practitioner

## 2012-09-15 DIAGNOSIS — R1310 Dysphagia, unspecified: Secondary | ICD-10-CM

## 2012-09-18 ENCOUNTER — Ambulatory Visit (HOSPITAL_COMMUNITY)
Admission: RE | Admit: 2012-09-18 | Discharge: 2012-09-18 | Disposition: A | Discharge status: Home / Self Care | Payer: Medicare Other | Source: Ambulatory Visit | Attending: Nurse Practitioner | Admitting: Nurse Practitioner

## 2012-09-18 DIAGNOSIS — R1310 Dysphagia, unspecified: Secondary | ICD-10-CM | POA: Insufficient documentation

## 2012-09-18 DIAGNOSIS — R6889 Other general symptoms and signs: Secondary | ICD-10-CM | POA: Insufficient documentation

## 2012-10-16 ENCOUNTER — Other Ambulatory Visit: Payer: Self-pay | Admitting: Cardiothoracic Surgery

## 2012-10-16 DIAGNOSIS — C160 Malignant neoplasm of cardia: Secondary | ICD-10-CM

## 2012-11-21 ENCOUNTER — Ambulatory Visit (HOSPITAL_BASED_OUTPATIENT_CLINIC_OR_DEPARTMENT_OTHER): Payer: Medicare Other | Admitting: Lab

## 2012-11-21 ENCOUNTER — Ambulatory Visit (HOSPITAL_BASED_OUTPATIENT_CLINIC_OR_DEPARTMENT_OTHER): Payer: Medicare Other | Admitting: Oncology

## 2012-11-21 ENCOUNTER — Ambulatory Visit: Payer: Medicare Other | Admitting: Lab

## 2012-11-21 VITALS — BP 190/91 | HR 74 | Temp 98.8°F | Resp 20 | Ht 63.0 in | Wt 136.7 lb

## 2012-11-21 DIAGNOSIS — R1310 Dysphagia, unspecified: Secondary | ICD-10-CM

## 2012-11-21 DIAGNOSIS — M549 Dorsalgia, unspecified: Secondary | ICD-10-CM

## 2012-11-21 DIAGNOSIS — C160 Malignant neoplasm of cardia: Secondary | ICD-10-CM

## 2012-11-21 LAB — CBC WITH DIFFERENTIAL/PLATELET
Basophils Absolute: 0.1 10*3/uL (ref 0.0–0.1)
Eosinophils Absolute: 0.2 10*3/uL (ref 0.0–0.5)
HCT: 35.2 % (ref 34.8–46.6)
HGB: 11.7 g/dL (ref 11.6–15.9)
NEUT#: 5.3 10*3/uL (ref 1.5–6.5)
NEUT%: 65.9 % (ref 38.4–76.8)
RDW: 15.4 % — ABNORMAL HIGH (ref 11.2–14.5)
lymph#: 1.6 10*3/uL (ref 0.9–3.3)

## 2012-11-21 LAB — COMPREHENSIVE METABOLIC PANEL (CC13)
Albumin: 3.6 g/dL (ref 3.5–5.0)
BUN: 12 mg/dL (ref 7.0–26.0)
CO2: 25 mEq/L (ref 22–29)
Calcium: 10.1 mg/dL (ref 8.4–10.4)
Chloride: 108 mEq/L — ABNORMAL HIGH (ref 98–107)
Glucose: 101 mg/dl — ABNORMAL HIGH (ref 70–99)
Potassium: 3.8 mEq/L (ref 3.5–5.1)

## 2012-11-21 NOTE — Progress Notes (Signed)
[facility]    OFFICE PROGRESS NOTE   INTERVAL HISTORY:   She returns as scheduled. She continues to have intermittent "choking "spells. An esophagram on 09/18/2012 revealed no obstruction or mass. Poor motility was noted throughout the gastric pull-through.  She complains of severe pain in the back and bilateral lower chest wall. This began after a fall 2 weeks ago. She reports striking her nose. The pain is partially relieved with hydrocodone. She reports a history of "anemia "and has been treated with IV iron by Dr. Foy Guadalajara.  Objective:  Vital signs in last 24 hours:  Blood pressure 190/91, pulse 74, temperature 98.8 F (37.1 C), temperature source Oral, resp. rate 20, height 5\' 3"  (1.6 m), weight 136 lb 11.2 oz (62.007 kg).    HEENT: Neck without mass Lymphatics: No cervical, supraclavicular, or axillary nodes Resp: Distant breath sounds, scattered and inspiratory coarse rhonchi, no respiratory distress Cardio: Regular rate and rhythm GI: No hepatomegaly, no mass, midline incisional hernia Vascular: No leg edema Musculoskeletal: No tenderness at the spine.      Lab Results:  Lab Results  Component Value Date   WBC 8.0 11/21/2012   HGB 11.7 11/21/2012   HCT 35.2 11/21/2012   MCV 86.2 11/21/2012   PLT 313 11/21/2012      Medications: I have reviewed the patient's current medications.  Assessment/Plan:  #1 Adenocarcinoma of the gastroesophageal junction status post endoscopic evaluation 07/27/2011 confirming a nonobstructing gastroesophageal junction mass. Staging CT scan showed no evidence for distant metastatic disease. Staging PET scan 08/20/2011 revealed hypermetabolic activity at the distal esophagus and no evidence for metastatic disease. She completed radiation 08/26/2011 through 10/04/2011 and weekly Taxol/carboplatin chemotherapy 08/31/2011 through 10/04/2011. -restaging PET scan 11/05/2011 confirmed a decrease in the FDG activity at the  distal esophagus with no evidence for metastatic disease. Status post esophagogastrectomy 12/20/2011 with pathology showing invasive adenocarcinoma, moderately differentiated, scattered foci, the largest spanning 0.26 cm; adenocarcinoma involved the muscularis mucosa; the surgical resection margins were negative; no evidence of carcinoma in 9 of 9 lymph nodes.  #2 Solid/liquid dysphagia, subxiphoid discomfort, and weight loss secondary to esophagus cancer. There was initial improvement in the dysphagia. She has recurrent dysphagia. Esophagram on 09/18/2012 revealed no mass or obstruction. Poor motility was noted in the gastric pull-through. #3 Subserosal gastrointestinal stromal tumor, low-grade, spanning 0.8 cm noted on pathology 12/20/2011.  #4 Status post left total hip replacement 06/30/2012.  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 Resolved.  #6 Chronic anemia with a history of chronic gastrointestinal blood loss. The hemoglobin is normal today. #7 Osteoarthritis.  #8 "Pernicious" anemia.  #9 Fibromyalgia.  #10 Chronic "calcific" cutaneous lesions.  #11 Fever and erythema with associated tenderness involving one of the calcific cutaneous lesions at the left anterior lateral leg on 09/21/2011. Improved following Keflex therapy.  #12 Multiple medication allergies.  #13 Hypertension.  #14 Superficial phlebitis right arm on 10/18/2011.  #15 Abdominal wall/incisional hernia.  #16 back and chest wall pain beginning approximately 2 weeks ago after a fall-? Compression fracture,? Retractor, question pain related to fibromyalgia. I have a low clinical suspicion for recurrent esophagus cancer  Disposition:  Her weight has increased. I have a low clinical suspicion for recurrent esophagus cancer. She is scheduled to see Dr. Tyrone Sage later this week. She will undergo restaging CT scans of the chest and abdomen. [name] will continue hydrocodone as needed for pain. She will return for an  office visit in 2 months. We will see her  sooner as indicated based on the CT results.   Thornton Papas, MD  11/21/2012  6:01 PM

## 2012-11-23 ENCOUNTER — Encounter: Payer: Medicare Other | Admitting: Cardiothoracic Surgery

## 2012-11-23 ENCOUNTER — Other Ambulatory Visit: Payer: Medicare Other

## 2012-11-24 ENCOUNTER — Ambulatory Visit (INDEPENDENT_AMBULATORY_CARE_PROVIDER_SITE_OTHER): Payer: Medicare Other | Admitting: Cardiothoracic Surgery

## 2012-11-24 ENCOUNTER — Other Ambulatory Visit: Payer: Self-pay | Admitting: *Deleted

## 2012-11-24 ENCOUNTER — Encounter: Payer: Self-pay | Admitting: Cardiothoracic Surgery

## 2012-11-24 VITALS — BP 150/82 | HR 79 | Resp 18 | Ht 63.0 in | Wt 136.0 lb

## 2012-11-24 DIAGNOSIS — Z09 Encounter for follow-up examination after completed treatment for conditions other than malignant neoplasm: Secondary | ICD-10-CM

## 2012-11-24 DIAGNOSIS — Z8501 Personal history of malignant neoplasm of esophagus: Secondary | ICD-10-CM

## 2012-11-24 NOTE — Progress Notes (Signed)
[address]            [place] [address]          [phone]      [name] [facility] Medical Record #[id number] Date of Birth: 1934/05/21  Referring: Ladene Artist, MD Primary Care: Lenora Boys, MD  Chief Complaint:    Chief Complaint  Patient presents with  . Routine Post Op    6 month f/u s/p TRANSHIATAL ESOPHAGOGASTRECTOMY 12/20/11    History of Present Illness:    The patient comes in today in followup after previous esophagectomy by Dr. Edwyna Shell in January 2013. In the past month the patient has had progressive deterioration in her sponge status, complaining of severe back pain. This is being evaluated by her primary care doctor. She's had episodes of having trouble swallowing. And now has abdominal distention related to ventral abdominal hernia.      Current Activity/ Functional Status: Patient is not independent with mobility/ambulation, transfers, ADL's, IADL's.   Past Medical History  Diagnosis Date  . Pernicious anemia   . Osteoarthritis   . Fibromyalgia   . Skin lesion     chronic calcific cutaneous lesions  . HX of multiple bleeding ulcers 09/01/2011  . Hypertension     takes Atenolol daily  . Emphysema   . Bronchitis     hx  . Adenocarcinoma of gastroesophageal junction   . Dizziness     d/t crystals in ears that "roll around"and can't get them out  . Back pain   . Osteoporosis   . Scoliosis   . Psoriasis   . H/O hiatal hernia   . GERD (gastroesophageal reflux disease)     takes Aciphex daily  . Gastric ulcer   . Constipation     since Chemo and Radiation;finished up in Nov 2012  . Diverticulosis   . History of colonic polyps   . Shingles     broke out 2wks ago;has been on meds and to finish up tomorrow  . Kidney stone 25+yrs ago  . Pernicious anemia   . Vitamin B deficiency   . Complication of anesthesia     shortness of breath and pain with gas   . COPD (chronic obstructive pulmonary  disease)   . Shortness of breath     with exertion   . Blood transfusion     Past Surgical History  Procedure Date  . Right hip replacement 2001  . Tonsillectomy     as a child  . Tubal ligation 1975  . Appendectomy 1975  . Cataract surgery 10+yrs ago  . Esophagogastroduodenoscopy   . Colonoscopy   . Extubation 12/20/2011       . Partial esophagectomy 12/20/2011    Procedure: ESOPHAGECTOMY PARTIAL;  Surgeon: Norton Blizzard, MD;  Location: [facility] [place] OR;  Service: Thoracic;  Laterality: N/A;  . Total hip arthroplasty 06/30/2012    Procedure: TOTAL HIP ARTHROPLASTY ANTERIOR APPROACH;  Surgeon: Kathryne Hitch, MD;  Location: [facility] ORS;  Service: Orthopedics;  Laterality: Left;  Left total hip arthroplasty    Family History  Problem Relation Age of Onset  . Cancer Maternal Grandmother     gynecologic  . Cancer Maternal Aunt     gynecologic  . Breast cancer Paternal Aunt   . Breast cancer Paternal Aunt   . Breast cancer Paternal Aunt   . Anesthesia problems  Neg Hx   . Hypotension Neg Hx   . Malignant hyperthermia Neg Hx   . Pseudochol deficiency Neg Hx     History   Social History  . Marital Status: Widowed    Spouse Name: N/A    Number of Children: 5  . Years of Education: N/A   Occupational History  . Not on file.   Social History Main Topics  . Smoking status: Former Smoker    Quit date: 12/07/1991  . Smokeless tobacco: Never Used  . Alcohol Use: No  . Drug Use: No  . Sexually Active: Not on file   Other Topics Concern  . 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 Former Smoker  . Quit date: 12/07/1991  Smokeless tobacco  . Never Used    History  Alcohol Use No     Allergies  Allergen Reactions  . Adhesive (Tape) Itching  . Clarithromycin Itching  . Darvon Itching  . Epinephrine Itching  . Morphine And Related  Itching  . Oxycodone Itching  . Oxycodone Hcl Er Itching  . Sulfa Antibiotics Itching  . Vicodin (Hydrocodone-Acetaminophen) Itching    Can take with Benadryl    Current Outpatient Prescriptions  Medication Sig Dispense Refill  . acetaminophen (TYLENOL) 500 MG tablet Take 1,000 mg by mouth every 6 (six) hours as needed. PAIN      . atenolol (TENORMIN) 50 MG tablet Take 50 mg by mouth 2 (two) times daily.      . diphenhydrAMINE (BENADRYL) 25 MG tablet Take 25 mg by mouth See admin instructions. TAKE 1 TABLET 30 MINUTES PRIOR TO HYDROCODONE      . esomeprazole (NEXIUM) 40 MG capsule Take 40 mg by mouth daily before breakfast.      . HYDROcodone-acetaminophen (NORCO/VICODIN) 5-325 MG per tablet Take 1 tablet by mouth every 12 (twelve) hours.      Marland Kitchen lisinopril (PRINIVIL,ZESTRIL) 40 MG tablet Take 40 mg by mouth every morning.       . mupirocin ointment (BACTROBAN) 2 % Apply topically as needed.      Marland Kitchen OVER THE COUNTER MEDICATION Take 1 tablet by mouth 2 (two) times daily. [facility] WORT      . predniSONE (DELTASONE) 1 MG tablet Take 2 mg by mouth every morning. 2 TABLETS A DAY      . raloxifene (EVISTA) 60 MG tablet Take 60 mg by mouth every morning.       . tiotropium (SPIRIVA) 18 MCG inhalation capsule Place 18 mcg into inhaler and inhale every morning.       Marland Kitchen tiZANidine (ZANAFLEX) 4 MG tablet Take 2 mg by mouth every 6 (six) hours as needed. TAKES 1/2 TABLET-MUSCLE STIFFNESS      . traMADol (ULTRAM) 50 MG tablet Take 50 mg by mouth every 6 (six) hours as needed.           Review of Systems:     Cardiac Review of Systems: Y or N  Chest Pain [ n   ]  Resting SOB [ n  ] Exertional SOB  [ y ]  Pollyann Kennedy Milo.Brash  ]   Pedal Edema [n  ]    Palpitations Milo.Brash  ] Syncope  Milo.Brash  ]   Presyncope [ n  ]  General Review of Systems: [Y] = yes [  ]=no Constitional:  recent weight change [ y ]; anorexia [ y ]; fatigue [ y ]; nausea [  ]; night sweats [  ]; fever [n  ]; or chills [ n ];                                                                                                                                           Dental: poor dentition[n  ];  Eye : blurred vision [  ]; diplopia [   ]; vision changes [  ];  Amaurosis fugax[  ]; Resp: cough Cove.Etienne  ];  wheezing[  ];  hemoptysis[  ]; shortness of breath[  ]; paroxysmal nocturnal dyspnea[  ]; dyspnea on exertion[  ]; or orthopnea[  ];  GI:  gallstones[  ], vomiting[  ];  dysphagia[  ]; melena[  ];  hematochezia [  ]; heartburn[  ];   Hx of  Colonoscopy[  ]; GU: kidney stones [  ]; hematuria[  ];   dysuria [  ];  nocturia[  ];  history of     obstruction [  ];             Skin: rash, swelling[  ];, hair loss[  ];  peripheral edema[  ];  or itching[  ]; Musculosketetal: myalgias[  ];  joint swelling[  ];  joint erythema[  ];  joint pain[  ];  back pain[ new and severe  ];  Heme/Lymph: bruising[  ];  bleeding[  ];  anemia[  ];  Neuro: TIA[  ];  headaches[  ];  stroke[  ];  vertigo[  ];  seizures[  ];   paresthesias[  ];  difficulty walking[ in wheel chair now ];  Psych:depression[  ]; anxiety[  ];  Endocrine: diabetes[  ];  thyroid dysfunction[  ];  Immunizations: Flu [  ?]; Pneumococcal[ ? ];  Other:  Physical Exam: BP 150/82  Pulse 79  Resp 18  Ht 5\' 3"  (1.6 m)  Wt 136 lb (61.689 kg)  BMI 24.09 kg/m2  SpO2 96%  General appearance: alert, cooperative, appears older than stated age, cachectic, fatigued and mild distress Neurologic: intact Heart: regular rate and rhythm, S1, S2 normal, no murmur, click, rub or gallop and normal apical impulse Lungs: clear to auscultation bilaterally and normal percussion bilaterally Abdomen: Patient now has a large incisional abdominal hernia that she notes is uncomfortable and has been progressing in size since last seen by Dr. Edwyna Shell Extremities: extremities normal, atraumatic, no cyanosis or edema and Homans sign is negative, no sign of DVT Wound: Patient's left neck incision is well-healed I do not appreciate  any cervical or supraclavicular adenopathy or axillary adenopathy. As noted her abdominal incision has a large ventral abdominal hernia   Diagnostic Studies & Laboratory data:     Recent Radiology Findings:  A CT scan of the chest and abdomen have been requested, but  prior to her appointment today had been turned down by her insurance company. I've called today to make sure there were of the increasing back pain, previous nodules on CT scan of the chest that required followup, and increasing abdominal discomfort both from her back pain and ventral hernia    CT of chest 05/2012: *RADIOLOGY REPORT*  Clinical Data: History of esophageal carcinoma with resection and  January 2013, some weight loss, former smoking history with  shortness of breath  CT CHEST WITHOUT CONTRAST  Technique: Multidetector CT imaging of the chest was performed  following the standard protocol without IV contrast.  Comparison: Chest x-ray of 04/03/2012 and CT of the chest of  08/04/2011  Findings: In the interval esophagogastrectomy has been performed.  The proximal anastomosis appears patent with no complicating  features. However there has been interval development of a  moderate-sized right pleural effusion with a tiny left pleural  effusion present as well. There is a small nodule which is  pleural-based posteriorly in the left lower lobe on image number 36  which appears new and attention to this area on follow-up CT is  recommended. No definite pulmonary metastases are noted. A small  nodular opacity is noted abutting the pleura in the right lower  lobe medially and attention to this area also is recommended on  followup scans. Diffuse changes of centrilobular emphysema are  noted primarily involving the upper lung fields.  On soft tissue window images, there is slight asymmetry of the  thyroid with the right lower lobe larger than the left. No  mediastinal or hilar adenopathy is seen on this unenhanced  study.  No axillary adenopathy is noted. There is cardiomegaly present and  there is now a moderate-sized pericardial effusion present which is  a new finding. Coronary artery calcifications are present  primarily in the distribution of the left anterior descending  artery. No definite abnormality of the upper abdomen is seen.  IMPRESSION:  1. Esophagogastrectomy with no complicating features.  2. Bilateral pleural effusions right larger than left.  3. New moderate-sized pericardial effusion. Cardiomegaly.  4. Small pleural based nodule in the posteromedial left lower lobe  and in the medial right lung field. Attention to these areas is  recommended on follow-up CT of the chest.  Original Report Authenticated By: Juline Patch, M.D.  Recent Lab Findings: Lab Results  Component Value Date   WBC 8.0 11/21/2012   HGB 11.7 11/21/2012   HCT 35.2 11/21/2012   PLT 313 11/21/2012   GLUCOSE 101* 11/21/2012   ALT <6 Repeated and Verified 11/21/2012   AST 13 11/21/2012   NA 141 11/21/2012   K 3.8 11/21/2012   CL 108* 11/21/2012   CREATININE 0.9 11/21/2012   BUN 12.0 11/21/2012   CO2 25 11/21/2012   TSH 0.935 01/19/2012   INR 1.02 06/23/2012      Assessment / Plan:     The patient is obviously in discomfort primarily related to her back pain at this point it's not clear if this could be metastatic disease to the bone or not. The previous scan showed some small pulmonary nodules that are need a followup.  I spent 30 minutes today discussing and make an attempt to have the CT scan that was previously ordered for followup by Dr. Edwyna Shell to be approved both to evaluate for possible metastatic disease in the chest to followup the pulmonary nodules and to evaluate her spine.  Because of her difficulty swallowing we've asked Dr. Madilyn Fireman  to see her back again in consider endoscopy.  Plan to see her back in the CT scan of the chest and abdomen is completed       Delight Ovens  MD  Beeper [phone] Office [phone] 11/24/2012 5:13 PM

## 2012-11-30 ENCOUNTER — Other Ambulatory Visit (HOSPITAL_COMMUNITY): Payer: Medicare Other

## 2012-12-05 ENCOUNTER — Ambulatory Visit (HOSPITAL_COMMUNITY)
Admission: RE | Admit: 2012-12-05 | Discharge: 2012-12-05 | Payer: Medicare Other | Source: Ambulatory Visit | Attending: Cardiothoracic Surgery | Admitting: Cardiothoracic Surgery

## 2012-12-08 ENCOUNTER — Encounter: Payer: Medicare Other | Admitting: Cardiothoracic Surgery

## 2012-12-12 ENCOUNTER — Ambulatory Visit (HOSPITAL_COMMUNITY)
Admission: RE | Admit: 2012-12-12 | Discharge: 2012-12-12 | Disposition: A | Discharge status: Home / Self Care | Payer: Medicare Other | Source: Ambulatory Visit | Attending: Cardiothoracic Surgery | Admitting: Cardiothoracic Surgery

## 2012-12-12 DIAGNOSIS — C159 Malignant neoplasm of esophagus, unspecified: Secondary | ICD-10-CM | POA: Insufficient documentation

## 2012-12-12 DIAGNOSIS — X58XXXA Exposure to other specified factors, initial encounter: Secondary | ICD-10-CM | POA: Insufficient documentation

## 2012-12-12 DIAGNOSIS — I2510 Atherosclerotic heart disease of native coronary artery without angina pectoris: Secondary | ICD-10-CM | POA: Insufficient documentation

## 2012-12-12 DIAGNOSIS — C160 Malignant neoplasm of cardia: Secondary | ICD-10-CM

## 2012-12-12 DIAGNOSIS — R0602 Shortness of breath: Secondary | ICD-10-CM | POA: Insufficient documentation

## 2012-12-12 DIAGNOSIS — M549 Dorsalgia, unspecified: Secondary | ICD-10-CM | POA: Insufficient documentation

## 2012-12-12 DIAGNOSIS — S22009A Unspecified fracture of unspecified thoracic vertebra, initial encounter for closed fracture: Secondary | ICD-10-CM | POA: Insufficient documentation

## 2012-12-12 DIAGNOSIS — Z923 Personal history of irradiation: Secondary | ICD-10-CM | POA: Insufficient documentation

## 2012-12-12 DIAGNOSIS — R918 Other nonspecific abnormal finding of lung field: Secondary | ICD-10-CM | POA: Insufficient documentation

## 2012-12-12 DIAGNOSIS — Z09 Encounter for follow-up examination after completed treatment for conditions other than malignant neoplasm: Secondary | ICD-10-CM | POA: Insufficient documentation

## 2012-12-12 DIAGNOSIS — R109 Unspecified abdominal pain: Secondary | ICD-10-CM | POA: Insufficient documentation

## 2012-12-12 DIAGNOSIS — N200 Calculus of kidney: Secondary | ICD-10-CM | POA: Insufficient documentation

## 2012-12-12 DIAGNOSIS — Z9221 Personal history of antineoplastic chemotherapy: Secondary | ICD-10-CM | POA: Insufficient documentation

## 2012-12-12 MED ORDER — IOHEXOL 300 MG/ML  SOLN
100.0000 mL | Freq: Once | INTRAMUSCULAR | Status: AC | PRN
  Administered 2012-12-12: 100 mL via INTRAVENOUS

## 2012-12-21 ENCOUNTER — Encounter: Payer: Self-pay | Admitting: Cardiothoracic Surgery

## 2012-12-21 ENCOUNTER — Ambulatory Visit (INDEPENDENT_AMBULATORY_CARE_PROVIDER_SITE_OTHER): Payer: Medicare Other | Admitting: Cardiothoracic Surgery

## 2012-12-21 VITALS — BP 136/82 | HR 72 | Resp 18 | Ht 63.0 in | Wt 136.0 lb

## 2012-12-21 DIAGNOSIS — M549 Dorsalgia, unspecified: Secondary | ICD-10-CM

## 2012-12-21 DIAGNOSIS — Z8501 Personal history of malignant neoplasm of esophagus: Secondary | ICD-10-CM

## 2012-12-21 DIAGNOSIS — R1310 Dysphagia, unspecified: Secondary | ICD-10-CM

## 2012-12-21 DIAGNOSIS — S22009A Unspecified fracture of unspecified thoracic vertebra, initial encounter for closed fracture: Secondary | ICD-10-CM

## 2012-12-21 DIAGNOSIS — R918 Other nonspecific abnormal finding of lung field: Secondary | ICD-10-CM

## 2012-12-21 DIAGNOSIS — S22000A Wedge compression fracture of unspecified thoracic vertebra, initial encounter for closed fracture: Secondary | ICD-10-CM

## 2012-12-21 NOTE — Progress Notes (Signed)
[address]            [place] [address]          [phone]         [facility] Medical Record #[id number] Date of Birth: [birth year]  Referring: Ladene Artist, MD Primary Care: Lenora Boys, MD  Chief Complaint:    Chief Complaint  Patient presents with  . Follow-up    s/p CT CHEST AND ABD to assess pulm nodules, previous surgery for esophageal cancer and back pain.    History of Present Illness:    OPERATIVE REPORT  12/19/2011 PREOPERATIVE DIAGNOSIS: Stage IIIA adenocarcinoma of the esophagus  status post radiation and chemotherapy.  POSTOPERATIVE DIAGNOSIS: Stage IIIA adenocarcinoma of the esophagus  status post radiation and chemotherapy.  OPERATION PERFORMED: Transhiatal esophagogastrectomy, jejunostomy, and  pyloroplasty.  The patient returns to the office after a followup CT scan. She was last seen in the office she was complaining of significant back pain and some trouble swallowing. The concern was if she had recurrent esophageal cancer with bony metastasis after some difficulty in obtaining approval for a followup CT of the chest and abdomen from her insurance company these were carried out on January 7. By the CT scan there is no evidence of recurrent malignancy. She reports that yesterday Dr. Madilyn Fireman performed a upper GI endoscopy and told her everything looked "okay". She continues to be significantly limited by back pain with constant discomfort this started after a fall around Thanksgiving.  Current Activity/ Functional Status: Patient is not independent with mobility/ambulation, transfers, ADL's, IADL's.   Past Medical History  Diagnosis Date  . Pernicious anemia   . Osteoarthritis   . Fibromyalgia   . Skin lesion     chronic calcific cutaneous lesions  . HX of multiple bleeding ulcers 09/01/2011  . Hypertension     takes Atenolol daily  . Emphysema   . Bronchitis     hx  .  Adenocarcinoma of gastroesophageal junction   . Dizziness     d/t crystals in ears that "roll around"and can't get them out  . Back pain   . Osteoporosis   . Scoliosis   . Psoriasis   . H/O hiatal hernia   . GERD (gastroesophageal reflux disease)     takes Aciphex daily  . Gastric ulcer   . Constipation     since Chemo and Radiation;finished up in Nov 2012  . Diverticulosis   . History of colonic polyps   . Shingles     broke out 2wks ago;has been on meds and to finish up tomorrow  . Kidney stone 25+yrs ago  . Pernicious anemia   . Vitamin B deficiency   . Complication of anesthesia     shortness of breath and pain with gas   . COPD (chronic obstructive pulmonary disease)   . Shortness of breath     with exertion   . Blood transfusion     Past Surgical History  Procedure Date  . Right hip replacement 2001  . Tonsillectomy     as a child  . Tubal ligation 1975  . Appendectomy 1975  . Cataract surgery 10+yrs ago  . Esophagogastroduodenoscopy   . Colonoscopy   . Extubation 12/20/2011       .  Partial esophagectomy 12/20/2011    Procedure: ESOPHAGECTOMY PARTIAL;  Surgeon: Norton Blizzard, MD;  Location: [facility] OR;  Service: Thoracic;  Laterality: N/A;  . Total hip arthroplasty 06/30/2012    Procedure: TOTAL HIP ARTHROPLASTY ANTERIOR APPROACH;  Surgeon: Kathryne Hitch, MD;  Location: [facility] ORS;  Service: Orthopedics;  Laterality: Left;  Left total hip arthroplasty    Family History  Problem Relation Age of Onset  . Cancer Maternal Grandmother     gynecologic  . Cancer Maternal Aunt     gynecologic  . Breast cancer Paternal Aunt   . Breast cancer Paternal Aunt   . Breast cancer Paternal Aunt   . Anesthesia problems Neg Hx   . Hypotension Neg Hx   . Malignant hyperthermia Neg Hx   . Pseudochol deficiency Neg Hx     History   Social History  . Marital Status: Widowed    Spouse Name: N/A    Number of Children: 5  . Years of Education: N/A   Occupational  History  . Not on file.   Social History Main Topics  . Smoking status: Former Smoker    Quit date: 12/07/1991  . Smokeless tobacco: Never Used  . Alcohol Use: No  . Drug Use: No  . Sexually Active: Not on file   Other Topics Concern  . 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 Former Smoker  . Quit date: 12/07/1991  Smokeless tobacco  . Never Used    History  Alcohol Use No     Allergies  Allergen Reactions  . Adhesive (Tape) Itching  . Clarithromycin Itching  . Darvon Itching  . Epinephrine Itching  . Morphine And Related Itching  . Oxycodone Itching  . Oxycodone Hcl Er Itching  . Sulfa Antibiotics Itching  . Vicodin (Hydrocodone-Acetaminophen) Itching    Can take with Benadryl    Current Outpatient Prescriptions  Medication Sig Dispense Refill  . acetaminophen (TYLENOL) 500 MG tablet Take 1,000 mg by mouth every 6 (six) hours as needed. PAIN      . atenolol (TENORMIN) 50 MG tablet Take 50 mg by mouth 2 (two) times daily.      . diphenhydrAMINE (BENADRYL) 25 MG tablet Take 25 mg by mouth See admin instructions. TAKE 1 TABLET 30 MINUTES PRIOR TO HYDROCODONE      . esomeprazole (NEXIUM) 40 MG capsule Take 40 mg by mouth daily before breakfast.      . HYDROcodone-acetaminophen (NORCO/VICODIN) 5-325 MG per tablet Take 1 tablet by mouth every 12 (twelve) hours.      Marland Kitchen lisinopril (PRINIVIL,ZESTRIL) 40 MG tablet Take 40 mg by mouth every morning.       . mupirocin ointment (BACTROBAN) 2 % Apply topically as needed.      Marland Kitchen OVER THE COUNTER MEDICATION Take 1 tablet by mouth 2 (two) times daily. [facility] WORT      . predniSONE (DELTASONE) 1 MG tablet Take 2 mg by mouth every morning. 2 TABLETS A DAY      . raloxifene (EVISTA) 60 MG tablet Take 60 mg by mouth every morning.       . tiotropium (SPIRIVA) 18 MCG inhalation capsule Place 18 mcg into  inhaler and inhale every morning.       Marland Kitchen tiZANidine (ZANAFLEX) 4 MG tablet Take 2 mg by mouth every 6 (  six) hours as needed. TAKES 1/2 TABLET-MUSCLE STIFFNESS      . traMADol (ULTRAM) 50 MG tablet Take 50 mg by mouth every 6 (six) hours as needed.           Review of Systems:     Cardiac Review of Systems: Y or N  Chest Pain [ n   ]  Resting SOB [ n  ] Exertional SOB  [ y ]  Pollyann Kennedy Milo.Brash  ]   Pedal Edema [n  ]    Palpitations [n  ] Syncope  Milo.Brash  ]   Presyncope [ n  ]  General Review of Systems: [Y] = yes [  ]=no Constitional: recent weight change [ y ]; anorexia [ y ]; fatigue [ y ]; nausea [  ]; night sweats [  ]; fever [n  ]; or chills [ n ];                                                                                                                                          Dental: poor dentition[n  ];  Eye : blurred vision [  ]; diplopia [   ]; vision changes [  ];  Amaurosis fugax[  ]; Resp: cough Cove.Etienne  ];  wheezing[  ];  hemoptysis[  ]; shortness of breath[  ]; paroxysmal nocturnal dyspnea[  ]; dyspnea on exertion[  ]; or orthopnea[  ];  GI:  gallstones[  ], vomiting[  ];  dysphagia[  ]; melena[  ];  hematochezia [  ]; heartburn[  ];   Hx of  Colonoscopy[  ]; GU: kidney stones [  ]; hematuria[  ];   dysuria [  ];  nocturia[  ];  history of     obstruction [  ];             Skin: rash, swelling[  ];, hair loss[  ];  peripheral edema[  ];  or itching[  ]; Musculosketetal: myalgias[  ];  joint swelling[  ];  joint erythema[  ];  joint pain[  ];  back pain[ new and severe  ];  Heme/Lymph: bruising[  ];  bleeding[  ];  anemia[  ];  Neuro: TIA[  ];  headaches[  ];  stroke[  ];  vertigo[  ];  seizures[  ];   paresthesias[  ];  difficulty walking[ in wheel chair now ];  Psych:depression[  ]; anxiety[  ];  Endocrine: diabetes[  ];  thyroid dysfunction[  ];  Immunizations: Flu [  ?]; Pneumococcal[ ? ];  Other:  Physical Exam: BP 136/82  Pulse 72  Resp 18  Ht 5\' 3"  (1.6 m)  Wt 136 lb  (61.689 kg)  BMI 24.09 kg/m2  SpO2 97%  General appearance: alert, cooperative, appears older than stated age, cachectic, fatigued and mild distress Neurologic: intact Heart: regular rate and rhythm, S1, S2 normal, no murmur,  click, rub or gallop and normal apical impulse Lungs: clear to auscultation bilaterally and normal percussion bilaterally Abdomen: Patient now has a large incisional abdominal hernia that she notes is uncomfortable and has been progressing in size since last seen by Dr. Edwyna Shell Extremities: extremities normal, atraumatic, no cyanosis or edema and Homans sign is negative, no sign of DVT Wound: Patient's left neck incision is well-healed I do not appreciate any cervical or supraclavicular adenopathy or axillary adenopathy. As noted her abdominal incision has a large ventral abdominal hernia   Diagnostic Studies & Laboratory data:     Recent Radiology Findings:  Ct Chest W Contrast  12/12/2012  *RADIOLOGY REPORT*  Clinical Data:  Esophageal cancer post esophageal surgery, chemotherapy and radiation therapy, follow-up, back pain, shortness of breath, upper abdominal pain  CT CHEST AND ABDOMEN WITH CONTRAST  Technique:  Multidetector CT imaging of the chest and abdomen was performed following the standard protocol during bolus administration of intravenous contrast.  Sagittal and coronal MPR images reconstructed from axial data set.  Contrast: OMNIPAQUE IOHEXOL 300 MG/ML  SOLN Dilute oral contrast.  Comparison:  CT chest 05/17/2012, noncontrast CTchest and abdomen from PET CT 11/05/2011  CT CHEST  Findings: Prior esophageal resection and gastric pull-up. Atherosclerotic changes greatest at descending thoracic aorta. Scattered coronary arterial calcifications noted. Minimal pericardial effusion. No thoracic adenopathy. Small amount of pleural fluid at the right lung base. Emphysematous changes greatest upper lobes. Scattered peripheral subpleural interstitial changes in the lungs  bilaterally, greater on the right, little changed.  Tiny bilateral nonspecific pulmonary nodules image 28 unchanged. No dominant pulmonary mass, pulmonary infiltrate, or pneumothorax. Bones appear diffusely demineralized with a new compression deformity of a lower thoracic vertebra, approximate T9, with 50% anterior height loss. Chronic compression deformity of the T6 vertebral body appears unchanged. Scattered degenerative disc disease changes lower thoracic and lumbar spine.  IMPRESSION: Post esophageal resection and gastric pull-up. No evidence of metastatic or recurrent disease. Osseous demineralization with new thoracic compression fracture at T9 and demonstrating approximately 50% anterior height loss. Tiny stable bilateral pulmonary nodules.  CT ABDOMEN  Findings: Lax anterior abdominal wall fascia versus prior ventral hernia repair. Nonobstructing calculus 4 mm diameter mid right kidney.  Liver, spleen, pancreas, kidneys, and adrenal glands normal appearance. Scattered atherosclerotic calcifications. Intra-abdominal portion of stomach and visualized bowel loops normal appearance. Scattered cutaneous calcifications at the posterolateral pelvis bilaterally. No intra abdominal mass, adenopathy, free fluid, or inflammatory process. Scattered degenerative disc disease changes thoracolumbar spine with mild scoliosis.  IMPRESSION: No acute intra abdominal or intrapelvic abnormalities. Question lax anterior abdominal wall fascia versus prior ventral hernia repair. Nonobstructing 4 mm right renal calculus.   Original Report Authenticated By: Ulyses Southward, M.D.    Ct Abdomen W Contrast  12/12/2012  *RADIOLOGY REPORT*  Clinical Data:  Esophageal cancer post esophageal surgery, chemotherapy and radiation therapy, follow-up, back pain, shortness of breath, upper abdominal pain  CT CHEST AND ABDOMEN WITH CONTRAST  Technique:  Multidetector CT imaging of the chest and abdomen was performed following the standard protocol  during bolus administration of intravenous contrast.  Sagittal and coronal MPR images reconstructed from axial data set.  Contrast: OMNIPAQUE IOHEXOL 300 MG/ML  SOLN Dilute oral contrast.  Comparison:  CT chest 05/17/2012, noncontrast CTchest and abdomen from PET CT 11/05/2011  CT CHEST  Findings: Prior esophageal resection and gastric pull-up. Atherosclerotic changes greatest at descending thoracic aorta. Scattered coronary arterial calcifications noted. Minimal pericardial effusion. No thoracic adenopathy. Small amount of  pleural fluid at the right lung base. Emphysematous changes greatest upper lobes. Scattered peripheral subpleural interstitial changes in the lungs bilaterally, greater on the right, little changed.  Tiny bilateral nonspecific pulmonary nodules image 28 unchanged. No dominant pulmonary mass, pulmonary infiltrate, or pneumothorax. Bones appear diffusely demineralized with a new compression deformity of a lower thoracic vertebra, approximate T9, with 50% anterior height loss. Chronic compression deformity of the T6 vertebral body appears unchanged. Scattered degenerative disc disease changes lower thoracic and lumbar spine.  IMPRESSION: Post esophageal resection and gastric pull-up. No evidence of metastatic or recurrent disease. Osseous demineralization with new thoracic compression fracture at T9 and demonstrating approximately 50% anterior height loss. Tiny stable bilateral pulmonary nodules.  CT ABDOMEN  Findings: Lax anterior abdominal wall fascia versus prior ventral hernia repair. Nonobstructing calculus 4 mm diameter mid right kidney.  Liver, spleen, pancreas, kidneys, and adrenal glands normal appearance. Scattered atherosclerotic calcifications. Intra-abdominal portion of stomach and visualized bowel loops normal appearance. Scattered cutaneous calcifications at the posterolateral pelvis bilaterally. No intra abdominal mass, adenopathy, free fluid, or inflammatory process. Scattered  degenerative disc disease changes thoracolumbar spine with mild scoliosis.  IMPRESSION: No acute intra abdominal or intrapelvic abnormalities. Question lax anterior abdominal wall fascia versus prior ventral hernia repair. Nonobstructing 4 mm right renal calculus.   Original Report Authenticated By: Ulyses Southward, M.D.    CT of chest 05/2012: *RADIOLOGY REPORT*  Clinical Data: History of esophageal carcinoma with resection and  January 2013, some weight loss, former smoking history with  shortness of breath  CT CHEST WITHOUT CONTRAST  Technique: Multidetector CT imaging of the chest was performed  following the standard protocol without IV contrast.  Comparison: Chest x-ray of 04/03/2012 and CT of the chest of  08/04/2011  Findings: In the interval esophagogastrectomy has been performed.  The proximal anastomosis appears patent with no complicating  features. However there has been interval development of a  moderate-sized right pleural effusion with a tiny left pleural  effusion present as well. There is a small nodule which is  pleural-based posteriorly in the left lower lobe on image number 36  which appears new and attention to this area on follow-up CT is  recommended. No definite pulmonary metastases are noted. A small  nodular opacity is noted abutting the pleura in the right lower  lobe medially and attention to this area also is recommended on  followup scans. Diffuse changes of centrilobular emphysema are  noted primarily involving the upper lung fields.  On soft tissue window images, there is slight asymmetry of the  thyroid with the right lower lobe larger than the left. No  mediastinal or hilar adenopathy is seen on this unenhanced study.  No axillary adenopathy is noted. There is cardiomegaly present and  there is now a moderate-sized pericardial effusion present which is  a new finding. Coronary artery calcifications are present  primarily in the distribution of the left  anterior descending  artery. No definite abnormality of the upper abdomen is seen.  IMPRESSION:  1. Esophagogastrectomy with no complicating features.  2. Bilateral pleural effusions right larger than left.  3. New moderate-sized pericardial effusion. Cardiomegaly.  4. Small pleural based nodule in the posteromedial left lower lobe  and in the medial right lung field. Attention to these areas is  recommended on follow-up CT of the chest.  Original Report Authenticated By: Juline Patch, M.D.  Recent Lab Findings: Lab Results  Component Value Date   WBC 8.0 11/21/2012   HGB  11.7 11/21/2012   HCT 35.2 11/21/2012   PLT 313 11/21/2012   GLUCOSE 101* 11/21/2012   ALT <6 Repeated and Verified 11/21/2012   AST 13 11/21/2012   NA 141 11/21/2012   K 3.8 11/21/2012   CL 108* 11/21/2012   CREATININE 0.9 11/21/2012   BUN 12.0 11/21/2012   CO2 25 11/21/2012   TSH 0.935 01/19/2012   INR 1.02 06/23/2012      Assessment / Plan:   1. new thoracic compression fracture at T9 and demonstrating approximately 50% anterior height loss.   The patient is obviously in discomfort primarily related to her back pain at this point  there is no evidence of metastatic disease . She will discuss this with Dr. Annitta Needs who has been treating her for back pain with a new compression fracture of T9 with continued back pain  Referral to orthopedics or neurosurgery to help with control of her significantly limiting pain.  2 upper GI endoscopy was performed yesterday by Dr. Madilyn Fireman  I plan to see the patient back in 6 months for followup from her esophageal surgery, she has an appointment with Dr.Freed concerning her back pain next week  Delight Ovens MD  Beeper [phone] Office [phone] 12/21/2012 1:35 PM

## 2013-01-01 ENCOUNTER — Other Ambulatory Visit: Payer: Self-pay | Admitting: Family Medicine

## 2013-01-01 DIAGNOSIS — IMO0002 Reserved for concepts with insufficient information to code with codable children: Secondary | ICD-10-CM

## 2013-01-01 MED ORDER — SODIUM CHLORIDE 0.9 % IV SOLN
250.0000 mg | INTRAVENOUS | Status: DC
  Filled 2013-01-01: qty 20

## 2013-01-01 MED ORDER — SODIUM CHLORIDE 0.9 % IV SOLN: 250.0000 mg | Freq: Once | INTRAVENOUS | Status: DC

## 2013-01-01 MED ORDER — SODIUM CHLORIDE 0.9 % IV SOLN: INTRAVENOUS | Status: DC

## 2013-01-02 ENCOUNTER — Encounter (HOSPITAL_COMMUNITY)
Admission: RE | Admit: 2013-01-02 | Discharge: 2013-01-02 | Payer: Medicare Other | Source: Ambulatory Visit | Attending: Family Medicine | Admitting: Family Medicine

## 2013-01-04 ENCOUNTER — Other Ambulatory Visit: Payer: Self-pay | Admitting: Family Medicine

## 2013-01-04 DIAGNOSIS — IMO0002 Reserved for concepts with insufficient information to code with codable children: Secondary | ICD-10-CM

## 2013-01-07 ENCOUNTER — Ambulatory Visit
Admission: RE | Admit: 2013-01-07 | Discharge: 2013-01-07 | Disposition: A | Discharge status: Home / Self Care | Payer: Medicare Other | Source: Ambulatory Visit | Attending: Family Medicine | Admitting: Family Medicine

## 2013-01-07 DIAGNOSIS — IMO0002 Reserved for concepts with insufficient information to code with codable children: Secondary | ICD-10-CM

## 2013-01-09 ENCOUNTER — Other Ambulatory Visit: Payer: Self-pay | Admitting: Family Medicine

## 2013-01-09 ENCOUNTER — Ambulatory Visit
Admission: RE | Admit: 2013-01-09 | Discharge: 2013-01-09 | Disposition: A | Discharge status: Home / Self Care | Payer: Medicare Other | Source: Ambulatory Visit | Attending: Family Medicine | Admitting: Family Medicine

## 2013-01-09 DIAGNOSIS — IMO0002 Reserved for concepts with insufficient information to code with codable children: Secondary | ICD-10-CM

## 2013-01-10 ENCOUNTER — Ambulatory Visit
Admission: RE | Admit: 2013-01-10 | Discharge: 2013-01-10 | Disposition: A | Discharge status: Home / Self Care | Payer: Medicare Other | Source: Ambulatory Visit | Attending: Family Medicine | Admitting: Family Medicine

## 2013-01-10 VITALS — BP 179/86 | HR 68 | Temp 96.3°F | Resp 18

## 2013-01-10 DIAGNOSIS — IMO0002 Reserved for concepts with insufficient information to code with codable children: Secondary | ICD-10-CM

## 2013-01-10 MED ORDER — FENTANYL CITRATE 0.05 MG/ML IJ SOLN
25.0000 ug | INTRAMUSCULAR | Status: DC | PRN
  Administered 2013-01-10: 50 ug via INTRAVENOUS
  Administered 2013-01-10 (×2): 25 ug via INTRAVENOUS

## 2013-01-10 MED ORDER — KETOROLAC TROMETHAMINE 30 MG/ML IJ SOLN
30.0000 mg | Freq: Once | INTRAMUSCULAR | Status: AC
  Administered 2013-01-10: 30 mg via INTRAVENOUS

## 2013-01-10 MED ORDER — DIPHENHYDRAMINE HCL 50 MG/ML IJ SOLN
25.0000 mg | Freq: Once | INTRAMUSCULAR | Status: AC
  Administered 2013-01-10: 25 mg via INTRAVENOUS

## 2013-01-10 MED ORDER — MIDAZOLAM HCL 2 MG/2ML IJ SOLN
1.0000 mg | INTRAMUSCULAR | Status: DC | PRN
  Administered 2013-01-10: 1 mg via INTRAVENOUS
  Administered 2013-01-10 (×2): 0.5 mg via INTRAVENOUS

## 2013-01-10 MED ORDER — SODIUM CHLORIDE 0.9 % IV SOLN
Freq: Once | INTRAVENOUS | Status: AC
  Administered 2013-01-10: 10:00:00 via INTRAVENOUS

## 2013-01-10 MED ORDER — CEFAZOLIN SODIUM-DEXTROSE 2-3 GM-% IV SOLR
2.0000 g | Freq: Once | INTRAVENOUS | Status: AC
  Administered 2013-01-10: 2 g via INTRAVENOUS

## 2013-01-10 NOTE — Progress Notes (Signed)
Pt lungs are clear bilaterally and heart sounds are regular.

## 2013-01-10 NOTE — Progress Notes (Signed)
Dr. Bonnielee Haff in to speak with patient before vertebroplasty.  jkl

## 2013-01-18 ENCOUNTER — Telehealth: Payer: Self-pay | Admitting: *Deleted

## 2013-01-18 ENCOUNTER — Other Ambulatory Visit: Payer: Medicare Other | Admitting: Lab

## 2013-01-18 ENCOUNTER — Ambulatory Visit: Payer: Medicare Other | Admitting: Nurse Practitioner

## 2013-01-18 NOTE — Telephone Encounter (Signed)
Unable to make appointment today due to weather. Needs to reschedule asap according to son. Office will call.

## 2013-01-19 ENCOUNTER — Telehealth: Payer: Self-pay | Admitting: Oncology

## 2013-01-19 NOTE — Telephone Encounter (Signed)
Talked to patient r/s appt from 2/13 to 2/18 lab and ML

## 2013-01-23 ENCOUNTER — Other Ambulatory Visit (HOSPITAL_BASED_OUTPATIENT_CLINIC_OR_DEPARTMENT_OTHER): Payer: Medicare Other | Admitting: Lab

## 2013-01-23 ENCOUNTER — Ambulatory Visit (HOSPITAL_BASED_OUTPATIENT_CLINIC_OR_DEPARTMENT_OTHER): Payer: Medicare Other | Admitting: Lab

## 2013-01-23 ENCOUNTER — Telehealth: Payer: Self-pay | Admitting: Oncology

## 2013-01-23 ENCOUNTER — Ambulatory Visit (HOSPITAL_BASED_OUTPATIENT_CLINIC_OR_DEPARTMENT_OTHER): Payer: Medicare Other | Admitting: Nurse Practitioner

## 2013-01-23 VITALS — BP 160/88 | HR 71 | Temp 97.2°F | Resp 20 | Ht 63.0 in | Wt 139.0 lb

## 2013-01-23 DIAGNOSIS — D649 Anemia, unspecified: Secondary | ICD-10-CM

## 2013-01-23 DIAGNOSIS — R319 Hematuria, unspecified: Secondary | ICD-10-CM

## 2013-01-23 DIAGNOSIS — C160 Malignant neoplasm of cardia: Secondary | ICD-10-CM

## 2013-01-23 DIAGNOSIS — R1310 Dysphagia, unspecified: Secondary | ICD-10-CM

## 2013-01-23 DIAGNOSIS — R109 Unspecified abdominal pain: Secondary | ICD-10-CM

## 2013-01-23 LAB — COMPREHENSIVE METABOLIC PANEL (CC13)
ALT: <6 U/L (ref 0–55)
AST: 11 U/L (ref 5–34)
CO2: 24 mEq/L (ref 22–29)
Calcium: 9.5 mg/dL (ref 8.4–10.4)
Chloride: 106 mEq/L (ref 98–107)
Creatinine: 0.8 mg/dL (ref 0.6–1.1)
Potassium: 4.1 mEq/L (ref 3.5–5.1)
Sodium: 138 mEq/L (ref 136–145)
Total Protein: 7 g/dL (ref 6.4–8.3)

## 2013-01-23 LAB — URINALYSIS, MICROSCOPIC - CHCC
Glucose: NEGATIVE mg/dL
Nitrite: NEGATIVE
Protein: NEGATIVE mg/dL
Specific Gravity, Urine: 1.005 (ref 1.003–1.035)
Urobilinogen, UR: 0.2 mg/dL (ref 0.2–1)

## 2013-01-23 NOTE — Progress Notes (Signed)
OFFICE PROGRESS NOTE  Interval history:  [name] returns as scheduled. Restaging CT scans 12/12/2012 showed post esophageal resection and gastric pull-up; no evidence of metastatic or recurrent disease in the chest. There was a new thoracic compression fracture at T9 demonstrating approximately 50% anterior height loss. There were tiny stable bilateral pulmonary nodules. There was a nonobstructing calculus 4 mm in diameter mid right kidney. The liver, spleen, pancreas, kidneys and adrenal glands had a normal appearance. There was a question of lax anterior abdominal wall fascia versus prior ventral hernia repair.  MRI of the thoracic spine on 01/07/2013 showed a subacute T8 compression fracture with 50% loss of vertebral body height. There was no significant retropulsion. The compression fracture was felt to most likely be osteoporotic. There were chronic T12 and T5 compression fractures. There was mild reactive edema at T7 and T9 either associated with the compression fracture nearby or stress changes in the posterior vertebral body and pedicles associated with kyphotic deformity from the T8 fracture. There was multilevel mild thoracic spondylosis.  She underwent a T8 vertebroplasty on 01/10/2013.  The pain related to the compression fracture is better since the vertebroplasty. Over the past 2-3 weeks she has had significant pain at the right flank region. She thinks the pain is related to a kidney stone. She had a kidney stone in the past with similar pain. She takes Vicodin with partial relief. She has noted some hematuria. She spends most of the day in bed secondary to the pain.   Objective: Blood pressure 160/88, pulse 71, temperature 97.2 F (36.2 C), temperature source Oral, resp. rate 20, height 5\' 3"  (1.6 m), weight 139 lb (63.05 kg).  Oropharynx is without thrush or ulceration. Lungs are clear. Regular cardiac rhythm. Abdomen is soft and nontender. No hepatomegaly. She is nontender  over the right flank region. No skin rash. Extremities are without edema.  Lab Results: Lab Results  Component Value Date   WBC 8.0 11/21/2012   HGB 11.7 11/21/2012   HCT 35.2 11/21/2012   MCV 86.2 11/21/2012   PLT 313 11/21/2012    Chemistry:    Chemistry      Component Value Date/Time   NA 141 11/21/2012 1617   NA 134* 07/01/2012 0442   K 3.8 11/21/2012 1617   K 3.6 07/01/2012 0442   CL 108* 11/21/2012 1617   CL 104 07/01/2012 0442   CO2 25 11/21/2012 1617   CO2 22 07/01/2012 0442   BUN 12.0 11/21/2012 1617   BUN 9 07/01/2012 0442   CREATININE 0.9 11/21/2012 1617   CREATININE 0.72 07/01/2012 0442      Component Value Date/Time   CALCIUM 10.1 11/21/2012 1617   CALCIUM 8.7 07/01/2012 0442   ALKPHOS 110 11/21/2012 1617   ALKPHOS 68 01/25/2012 0703   AST 13 11/21/2012 1617   AST 14 01/25/2012 0703   ALT <6 Repeated and Verified 11/21/2012 1617   ALT 6 01/25/2012 0703   BILITOT 0.26 11/21/2012 1617   BILITOT 0.2* 01/25/2012 0703       Studies/Results: Mr Thoracic Spine Wo Contrast  01/07/2013  *RADIOLOGY REPORT*  Clinical Data: Compression fracture seen on recent chest CT.  Back pain.  MRI THORACIC SPINE WITHOUT CONTRAST  Technique:  Multiplanar and multiecho pulse sequences of the thoracic spine were obtained without intravenous contrast.  Comparison: CT 12/12/2012.  CT 05/17/2012.  Findings: Thoracic spinal numbering was performed from the craniocervical junction.  There is disagreement between today's study and the prior CT but numbering  from the cervicothoracic junction is the standard.  Exaggerated thoracic kyphosis and levoconvex curvature of the thoracic spine with the apex at T8.  The paraspinal soft tissues demonstrate small bilateral pleural effusions.  Gastric pull- through is present.  There is a chronic T5 compression fracture of 50% loss of vertebral body height and no retropulsion.  There is a subacute T8 compression fractures seen on prior CT with 50% loss of vertebral  body height and minimal retropulsion.  No resulting central canal compromise.  The T8 fracture demonstrates persistent edema.    There is no effacement marrow within the posterior elements and fracture is most likely a benign osteoporotic compression fracture.  Both the T7 and T9 vertebrae demonstrate bone marrow edema, greater on the left than right.  There is no collapse of either vertebral body and the appearance is likely either reactive or represent stress changes from the adjacent compression fracture and resulting focal kyphosis. Marked paravertebral phlegmon is present centered around T8.  Chronic T12 superior endplate compression fractures present without marrow edema or retropulsion.  The thoracic cord shows no intramedullary lesions or edema.  Mild degenerative disc disease is present with small disc protrusions at T1-T2, T2-T3, T4-T5, and T5- T6.  Several of the small protrusions contact and indenting the ventral thoracic cord.  Thoracic facet arthrosis is also present. Mild central stenosis is noted at T8-T9 associated with compression fracture and posterior ligamentum flavum redundancy and facet arthrosis.  Broad-based disc bulging is present at T11-T12, T12-L1 and L1-L2 without significant stenosis.  Multilevel severe disc degeneration with degenerative endplate changes in the lower thoracic spine.  IMPRESSION: 1.  Subacute T8 compression fracture with 50% loss of vertebral body height.  No  significant retropulsion. Compression fracture is most likely osteoporotic, without posterior element involvement or convincing evidence of metastatic disease. 2.  Chronic T12 and T5 compression fractures. 3.  Mild reactive edema at T7 and T9 is either associated with the compression fracture nearby or stress changes in the posterior vertebral body and pedicles associated with kyphotic deformity from the T8 fracture. 4. Multilevel mild thoracic spondylosis.   Original Report Authenticated By: Andreas Newport, M.D.     Dg Radiologist Eval And Mgmt  01/10/2013  *RADIOLOGY REPORT*  RADIOLOGIST EVALUATION AND MANAGEMENT-CONSULTATION:  Chief Complaint:  Thoracic spine fracture and pain.  History of Present Illness:  [name] is a pleasant 79 year old female who had undergone an esophagectomy in January of 2013 and a left hip replacement in July 2013 but was, otherwise, in her state of usual health.  She sustained a fall in November of 2013 and has had consistent mid back pain since that time.  The pain radiates into the ribs on both sides.  The extent of the radiation is slightly less than initially estimated.  She continues to have pain which she rates as 7/10 on a daily basis.  The pain is greatest before her pain medication is due every six hours.  She uses Vicodin with Benadryl due to allergies.  She states that the pain radiates to a lesser extent but is as severe as it was shortly after the fracture.  A Roland [employer] Disability Questionnaire was performed.  She scored 18/24.  Because of the pain she is unable to perform many activities of daily living.  She lives with her son but is no longer able to cook for herself or clean the house.  She requires some assistance with bathing and getting dressed.  She was previously able  to take care of these activities on her own.  Past Medical History:     1.    Esophageal cancer without evidence for recurrence. 2.    High blood pressure. 3.    Emphysema.  Past Surgical History:     1.    Gastroesophagectomy 12/20/11. 2.    Left total hip replacement 06/2012. 3.    Right total hip replacement 07/1990. 4.    Tubal ligation.  Medications:     1.    Vicodin 7.5 mg twice daily as needed. 2.    Tizanidine 4 mg once daily. 3.    Lisinopril 40 mg once daily. 4.    Amlodipine 5 mg once daily. 5.    Atenolol 50 mg two pills daily. 6.    Prednisone 2 mg daily. 7.    Tramadol 50 mg as needed (not currently taking). 8.    Benadryl 25 mg taken with pain medications due to allergies. 9.    Evista  60 mg once daily. 10.   St. John's Wort 300 mg two times a day. 11.   Tylenol 325 mg twice daily. 12.   Viactiv 500+D+K once daily. 13.   Aciphex 15 mg once daily.  Allergies:     1.    Sulfa drugs. 2.    Biaxin. 3.    Medications for yeast infection. 4.    Darvon 5.    Oxycontin. 6.    Oxycodone. 7.    Morphine. 8.    Adhesive tape. 9.    Epinephrine. 10.   Vicodin.  Narcotic pain medications result in hives which she successfully pretreats with Benadryl.  Social History:  The patient smokes cigarettes.  She denies use of alcohol.  She is widowed.  Her son lives with her.  She denies any constitutional or cardiovascular symptoms.  She does complain of shortness of breath.  She has continued indigestion and ulcers.  She complains of osteoarthritis and fibromyalgia.  She denies any skin or neurologic changes.  She denies any psychiatric or hormone changes.  She does complain of anemia and bleeding tendencies.  She does not take any blood thinning medications.  Physical Exam:  Vitals:  Blood pressure 157/77, pulse 63, temperature 96.3.  Palpation over the spinous processes reveals focal tenderness in the region of the mid thoracic spine.  Although she is tender over the entire thoracolumbar spine, no other areas of exaggerated tenderness are evident.  Imaging studies:  I have reviewed her MRI of the thoracic spine without contrast 01/07/13.  IMPRESSION/PLAN:     1.    Symptomatic nonhealing T8 compression fracture.  This appears       to be an osteoporotic fracture although she does have some       edematous changes at T7 and T9.  Metastatic disease cannot be       entirely excluded.  She also has a healed fracture of T5 and a near       completely healed fracture at T12.  Although one could consider       treating T7, T8 and T9, treating additional levels increases the       risk for this patient with multiple medical issues and emphysema.       I would, therefore, suggest treating only the T8 fracture       initially.  2.    If not previously considered, this patient may benefit from bisphosphonates to preserve further bone loss secondary to the osteoporosis.  Original Report Authenticated By: Marin Roberts, M.D.    Dg Epidural Veno/verte Bropl  01/10/2013  *RADIOLOGY REPORT*  Clinical Data:  Painful T8 compression deformity  T8 VERTEBROPLASTY  Sedation: Versed 2.0 mg, Fentanyl 100 mg.  Total Moderate Sedation Time: 30 minutes.  As antibiotic prophylaxis, Ancef  was ordered pre-procedure and administered intravenously within one hour of incision.  Fluoroscopy Time: 9 minutes and 22 seconds.  Procedure: The procedure, risks, benefits, and alternatives were explained to the patient. Questions regarding the procedure were encouraged and answered. The patient understands and consents to the procedure.  The back was prepped with betadine in a sterile fashion, and a sterile drape was applied covering the operative field. A sterile gown and sterile gloves were used for the procedure.  Under fluoroscopic guidance, a 13 gauge needle was inserted into the T8 vertebral body via a right transpedicular approach. Cement was injected.  This filled the superior endplate.  A small amount of contrast exited the anterior vertebral body at the superior endplate likely through a fracture line.  No evidence of cement in the para lumbar veins.  Under fluoroscopic guidance, an additional 13 gauge needle was inserted into the T8 vertebral body via left transpedicular approach.  Additional cement was injected filling the vertebral body from the inferior to superior endplate.  After cement hardening, the patient was transferred to her bed without complication.  Findings: Images demonstrate needle placement in the T8 vertebral body via a bilateral transpedicular approach.  The final image demonstrates cement filling the vertebral body, crossing the midline, and extending from the inferior to superior end plate. There is a small amount of cement in  the anterior the T7-T8 disc space.  IMPRESSION: Successful T8 vertebroplasty.   Original Report Authenticated By: Jolaine Click, M.D.     Medications: I have reviewed the patient's current medications.  Assessment/Plan:  #1 Adenocarcinoma of the gastroesophageal junction status post endoscopic evaluation 07/27/2011 confirming a nonobstructing gastroesophageal junction mass. Staging CT scan showed no evidence for distant metastatic disease. Staging PET scan 08/20/2011 revealed hypermetabolic activity at the distal esophagus and no evidence for metastatic disease. She completed radiation 08/26/2011 through 10/04/2011 and weekly Taxol/carboplatin chemotherapy 08/31/2011 through 10/04/2011. -restaging PET scan 11/05/2011 confirmed a decrease in the FDG activity at the distal esophagus with no evidence for metastatic disease. Status post esophagogastrectomy 12/20/2011 with pathology showing invasive adenocarcinoma, moderately differentiated, scattered foci, the largest spanning 0.26 cm; adenocarcinoma involved the muscularis mucosa; the surgical resection margins were negative; no evidence of carcinoma in 9 of 9 lymph nodes. Restaging CT scans 12/12/2012 showed no evidence of recurrent or metastatic disease. #2 Solid/liquid dysphagia, subxiphoid discomfort, and weight loss secondary to esophagus cancer. There was initial improvement in the dysphagia. She has recurrent dysphagia. Esophagram on 09/18/2012 revealed no mass or obstruction. Poor motility was noted in the gastric pull-through.  #3 Subserosal gastrointestinal stromal tumor, low-grade, spanning 0.8 cm noted on pathology 12/20/2011.  #4 Status post left total hip replacement 06/30/2012.  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 Resolved.  #6 Chronic anemia with a history of chronic gastrointestinal blood loss. The hemoglobin was in normal range on 11/21/2012.  #7 Osteoarthritis.  #8 "Pernicious" anemia.  #9 Fibromyalgia.  #10 Chronic  "calcific" cutaneous lesions.  #11 Fever and erythema with associated tenderness involving one of the calcific cutaneous lesions at the left anterior lateral leg on 09/21/2011. Improved following Keflex therapy.  #12 Multiple medication allergies.  #13 Hypertension.  #14 Superficial phlebitis right arm on 10/18/2011.  #  15 Abdominal wall/incisional hernia.  #16 Compression fracture at T8, likely osteoporotic, status post vertebroplasty 01/10/2013. #17 2-3 week history of right flank pain. She feels she likely has a kidney stone as she has had similar pain in the past with a kidney stone. She would like a referral to the urology Center.  Disposition-[name] remains in remission from the esophagus cancer. The etiology of the right flank pain is unclear. She is concerned for a kidney stone. The pain could also be related to the previous compression fracture or a new compression fracture. We are obtaining a urinalysis today and will make a referral to the urology Center. We offered her a different pain medication which she declined. She will return for a followup visit in 2 months. She will contact the office in the interim with any problems.  Patient seen with Dr. Truett Perna.  Lonna Cobb ANP/GNP-BC

## 2013-01-23 NOTE — Telephone Encounter (Signed)
gv and printed appt schedule for April and called [facility] triage nurse they do not have availabily until March...she will see what she can do to get pt in quicker... She will call pt.

## 2013-01-26 ENCOUNTER — Ambulatory Visit
Admission: RE | Admit: 2013-01-26 | Discharge: 2013-01-26 | Disposition: A | Discharge status: Home / Self Care | Payer: Medicare Other | Source: Ambulatory Visit | Attending: Family Medicine | Admitting: Family Medicine

## 2013-01-26 ENCOUNTER — Ambulatory Visit
Admission: RE | Admit: 2013-01-26 | Discharge: 2013-01-26 | Disposition: A | Discharge status: Home / Self Care | Payer: Medicare Other | Source: Ambulatory Visit

## 2013-01-26 ENCOUNTER — Other Ambulatory Visit: Payer: Self-pay | Admitting: Family Medicine

## 2013-01-26 DIAGNOSIS — IMO0002 Reserved for concepts with insufficient information to code with codable children: Secondary | ICD-10-CM

## 2013-01-29 ENCOUNTER — Ambulatory Visit
Admission: RE | Admit: 2013-01-29 | Discharge: 2013-01-29 | Disposition: A | Discharge status: Home / Self Care | Payer: Medicare Other | Source: Ambulatory Visit | Attending: Family Medicine | Admitting: Family Medicine

## 2013-01-29 VITALS — BP 158/79 | HR 66 | Resp 14

## 2013-01-29 MED ORDER — DIPHENHYDRAMINE HCL 25 MG PO CAPS
25.0000 mg | ORAL_CAPSULE | Freq: Once | ORAL | Status: AC
  Administered 2013-01-29: 25 mg via ORAL

## 2013-01-29 MED ORDER — FENTANYL CITRATE 0.05 MG/ML IJ SOLN
25.0000 ug | INTRAMUSCULAR | Status: DC | PRN
  Administered 2013-01-29 (×2): 50 ug via INTRAVENOUS

## 2013-01-29 MED ORDER — CEFAZOLIN SODIUM-DEXTROSE 2-3 GM-% IV SOLR
2.0000 g | Freq: Once | INTRAVENOUS | Status: AC
  Administered 2013-01-29: 2 g via INTRAVENOUS

## 2013-01-29 MED ORDER — DIPHENHYDRAMINE HCL 50 MG/ML IJ SOLN
25.0000 mg | Freq: Once | INTRAMUSCULAR | Status: AC
  Administered 2013-01-29: 25 mg via INTRAVENOUS

## 2013-01-29 MED ORDER — MIDAZOLAM HCL 2 MG/2ML IJ SOLN
1.0000 mg | INTRAMUSCULAR | Status: DC | PRN
  Administered 2013-01-29 (×2): 1 mg via INTRAVENOUS

## 2013-01-29 MED ORDER — SODIUM CHLORIDE 0.9 % IV SOLN
Freq: Once | INTRAVENOUS | Status: AC
  Administered 2013-01-29: 07:00:00 via INTRAVENOUS

## 2013-01-29 MED ORDER — KETOROLAC TROMETHAMINE 30 MG/ML IJ SOLN
30.0000 mg | Freq: Once | INTRAMUSCULAR | Status: AC
  Administered 2013-01-29: 30 mg via INTRAVENOUS

## 2013-01-29 NOTE — Progress Notes (Signed)
Pt resting quietly post VP in nursing area.

## 2013-01-29 NOTE — Progress Notes (Signed)
Pt's heart sounds are regular, lungs are clear bilaterally.

## 2013-01-29 NOTE — Progress Notes (Signed)
Patient complaining of itching after IV narcotics administered.  Medicated with IV and PO Benadryl.  jkl

## 2013-02-13 ENCOUNTER — Other Ambulatory Visit (HOSPITAL_COMMUNITY): Payer: Self-pay | Admitting: Family Medicine

## 2013-02-15 ENCOUNTER — Other Ambulatory Visit (HOSPITAL_COMMUNITY): Payer: Self-pay | Admitting: Family Medicine

## 2013-02-16 ENCOUNTER — Emergency Department (HOSPITAL_COMMUNITY)
Admission: EM | Admit: 2013-02-16 | Discharge: 2013-02-16 | Disposition: A | Discharge status: Home / Self Care | Payer: Medicare Other | Attending: Emergency Medicine | Admitting: Emergency Medicine

## 2013-02-16 ENCOUNTER — Encounter (HOSPITAL_COMMUNITY)
Admission: RE | Admit: 2013-02-16 | Discharge: 2013-02-16 | Disposition: A | Discharge status: Home / Self Care | Payer: Medicare Other | Source: Ambulatory Visit | Attending: Family Medicine | Admitting: Family Medicine

## 2013-02-16 ENCOUNTER — Encounter (HOSPITAL_COMMUNITY): Payer: Self-pay | Admitting: Emergency Medicine

## 2013-02-16 ENCOUNTER — Emergency Department (HOSPITAL_COMMUNITY): Payer: Medicare Other

## 2013-02-16 DIAGNOSIS — R197 Diarrhea, unspecified: Secondary | ICD-10-CM | POA: Insufficient documentation

## 2013-02-16 DIAGNOSIS — R142 Eructation: Secondary | ICD-10-CM | POA: Insufficient documentation

## 2013-02-16 DIAGNOSIS — R5381 Other malaise: Secondary | ICD-10-CM | POA: Insufficient documentation

## 2013-02-16 DIAGNOSIS — R1013 Epigastric pain: Secondary | ICD-10-CM | POA: Insufficient documentation

## 2013-02-16 DIAGNOSIS — R110 Nausea: Secondary | ICD-10-CM | POA: Insufficient documentation

## 2013-02-16 DIAGNOSIS — Z8619 Personal history of other infectious and parasitic diseases: Secondary | ICD-10-CM | POA: Insufficient documentation

## 2013-02-16 DIAGNOSIS — I10 Essential (primary) hypertension: Secondary | ICD-10-CM | POA: Insufficient documentation

## 2013-02-16 DIAGNOSIS — Z872 Personal history of diseases of the skin and subcutaneous tissue: Secondary | ICD-10-CM | POA: Insufficient documentation

## 2013-02-16 DIAGNOSIS — Z862 Personal history of diseases of the blood and blood-forming organs and certain disorders involving the immune mechanism: Secondary | ICD-10-CM | POA: Insufficient documentation

## 2013-02-16 DIAGNOSIS — R5383 Other fatigue: Secondary | ICD-10-CM | POA: Insufficient documentation

## 2013-02-16 DIAGNOSIS — Z8639 Personal history of other endocrine, nutritional and metabolic disease: Secondary | ICD-10-CM | POA: Insufficient documentation

## 2013-02-16 DIAGNOSIS — Z79899 Other long term (current) drug therapy: Secondary | ICD-10-CM | POA: Insufficient documentation

## 2013-02-16 DIAGNOSIS — Z8501 Personal history of malignant neoplasm of esophagus: Secondary | ICD-10-CM | POA: Insufficient documentation

## 2013-02-16 DIAGNOSIS — Z8709 Personal history of other diseases of the respiratory system: Secondary | ICD-10-CM | POA: Insufficient documentation

## 2013-02-16 DIAGNOSIS — Z87442 Personal history of urinary calculi: Secondary | ICD-10-CM | POA: Insufficient documentation

## 2013-02-16 DIAGNOSIS — K3189 Other diseases of stomach and duodenum: Secondary | ICD-10-CM | POA: Insufficient documentation

## 2013-02-16 DIAGNOSIS — Z8719 Personal history of other diseases of the digestive system: Secondary | ICD-10-CM | POA: Insufficient documentation

## 2013-02-16 DIAGNOSIS — J449 Chronic obstructive pulmonary disease, unspecified: Secondary | ICD-10-CM | POA: Insufficient documentation

## 2013-02-16 DIAGNOSIS — Z860100 Personal history of colon polyps, unspecified: Secondary | ICD-10-CM | POA: Insufficient documentation

## 2013-02-16 DIAGNOSIS — J4489 Other specified chronic obstructive pulmonary disease: Secondary | ICD-10-CM | POA: Insufficient documentation

## 2013-02-16 DIAGNOSIS — Z8711 Personal history of peptic ulcer disease: Secondary | ICD-10-CM | POA: Insufficient documentation

## 2013-02-16 DIAGNOSIS — D509 Iron deficiency anemia, unspecified: Secondary | ICD-10-CM | POA: Insufficient documentation

## 2013-02-16 DIAGNOSIS — Z9851 Tubal ligation status: Secondary | ICD-10-CM | POA: Insufficient documentation

## 2013-02-16 DIAGNOSIS — R1012 Left upper quadrant pain: Secondary | ICD-10-CM | POA: Insufficient documentation

## 2013-02-16 DIAGNOSIS — Z8739 Personal history of other diseases of the musculoskeletal system and connective tissue: Secondary | ICD-10-CM | POA: Insufficient documentation

## 2013-02-16 DIAGNOSIS — K219 Gastro-esophageal reflux disease without esophagitis: Secondary | ICD-10-CM | POA: Insufficient documentation

## 2013-02-16 DIAGNOSIS — Z87891 Personal history of nicotine dependence: Secondary | ICD-10-CM | POA: Insufficient documentation

## 2013-02-16 DIAGNOSIS — R141 Gas pain: Secondary | ICD-10-CM | POA: Insufficient documentation

## 2013-02-16 LAB — COMPREHENSIVE METABOLIC PANEL
CO2: 21 mEq/L (ref 19–32)
Calcium: 9.4 mg/dL (ref 8.4–10.5)
Chloride: 103 mEq/L (ref 96–112)
Creatinine, Ser: 1.15 mg/dL — ABNORMAL HIGH (ref 0.50–1.10)
GFR calc Af Amer: 52 mL/min — ABNORMAL LOW (ref >90)
GFR calc non Af Amer: 45 mL/min — ABNORMAL LOW (ref >90)
Glucose, Bld: 161 mg/dL — ABNORMAL HIGH (ref 70–99)
Total Bilirubin: 0.2 mg/dL — ABNORMAL LOW (ref 0.3–1.2)

## 2013-02-16 LAB — CBC WITH DIFFERENTIAL/PLATELET
Eosinophils Relative: 0 % (ref 0–5)
HCT: 46.7 % — ABNORMAL HIGH (ref 36.0–46.0)
Hemoglobin: 14.5 g/dL (ref 12.0–15.0)
Lymphocytes Relative: 6 % — ABNORMAL LOW (ref 12–46)
Lymphs Abs: 1.1 10*3/uL (ref 0.7–4.0)
MCV: 91.2 fL (ref 78.0–100.0)
Monocytes Absolute: 1.8 10*3/uL — ABNORMAL HIGH (ref 0.1–1.0)
Monocytes Relative: 9 % (ref 3–12)
RBC: 5.12 MIL/uL — ABNORMAL HIGH (ref 3.87–5.11)
WBC: 18.8 10*3/uL — ABNORMAL HIGH (ref 4.0–10.5)

## 2013-02-16 LAB — URINALYSIS, ROUTINE W REFLEX MICROSCOPIC
Bilirubin Urine: NEGATIVE
Glucose, UA: NEGATIVE mg/dL
Ketones, ur: NEGATIVE mg/dL
Nitrite: NEGATIVE
Protein, ur: NEGATIVE mg/dL

## 2013-02-16 LAB — URINE MICROSCOPIC-ADD ON

## 2013-02-16 LAB — LIPASE, BLOOD: Lipase: 25 U/L (ref 11–59)

## 2013-02-16 MED ORDER — LORAZEPAM 1 MG PO TABS
1.0000 mg | ORAL_TABLET | Freq: Once | ORAL | Status: AC
  Administered 2013-02-16: 1 mg via ORAL
  Filled 2013-02-16: qty 1

## 2013-02-16 MED ORDER — HYDROCODONE-ACETAMINOPHEN 5-325 MG PO TABS
1.0000 | ORAL_TABLET | Freq: Once | ORAL | Status: AC
  Administered 2013-02-16: 1 via ORAL
  Filled 2013-02-16: qty 1

## 2013-02-16 MED ORDER — SODIUM CHLORIDE 0.9 % IV SOLN
INTRAVENOUS | Status: DC
  Administered 2013-02-16: 13:00:00 via INTRAVENOUS

## 2013-02-16 MED ORDER — DIPHENHYDRAMINE HCL 25 MG PO CAPS
25.0000 mg | ORAL_CAPSULE | Freq: Once | ORAL | Status: AC
  Administered 2013-02-16: 25 mg via ORAL
  Filled 2013-02-16: qty 1

## 2013-02-16 MED ORDER — NA FERRIC GLUC CPLX IN SUCROSE 12.5 MG/ML IV SOLN
250.0000 mg | INTRAVENOUS | Status: DC
  Administered 2013-02-16: 250 mg via INTRAVENOUS
  Filled 2013-02-16: qty 20

## 2013-02-16 MED ORDER — ONDANSETRON 8 MG PO TBDP
8.0000 mg | ORAL_TABLET | Freq: Once | ORAL | Status: AC
  Administered 2013-02-16: 8 mg via ORAL
  Filled 2013-02-16: qty 1

## 2013-02-16 MED ORDER — SODIUM CHLORIDE 0.9 % IV BOLUS (SEPSIS)
500.0000 mL | Freq: Once | INTRAVENOUS | Status: AC
  Administered 2013-02-16: 500 mL via INTRAVENOUS

## 2013-02-16 NOTE — Progress Notes (Signed)
Pt still c/o nausea, having stomach discomfort.  When pt sits up she states she is too weak to stand and doesn't know how she is going to make it home.  I have encouraged pt to go to the ER.  Pt has finally agreed to let us take her to the ER.  Her son is at the bedside.

## 2013-02-16 NOTE — ED Notes (Signed)
Patient transported to X-ray 

## 2013-02-16 NOTE — Progress Notes (Signed)
Pt placed on bedpan per pt. Request.  She states she cannot get up to bathroom.

## 2013-02-16 NOTE — Progress Notes (Signed)
Pt requested for bedpan again.  She states she still has some stomach upset but not as bad. Will continue to monitor.

## 2013-02-16 NOTE — ED Provider Notes (Signed)
History     CSN: [id number]  Arrival date & time 02/16/13  1655   First MD Initiated Contact with Patient 02/16/13 1705      Chief Complaint  Patient presents with  . Diarrhea  . Nausea    (Consider location/radiation/quality/duration/timing/severity/associated sxs/prior treatment) HPI Pt states she ate too much for lunch and while getting iron transfusion became nauseated, had abdominal cramping and multiple episodes of diarrhea. No blood or melena. Abdominal pain is improved. Pt states she has constant baseline abd pain. No nausea currently. Pt states she has had multiple similar episodes in the past after over-eating. Denies fever, chills, urinary symptoms.  Past Medical History  Diagnosis Date  . Pernicious anemia   . Osteoarthritis   . Fibromyalgia   . Skin lesion     chronic calcific cutaneous lesions  . HX of multiple bleeding ulcers 09/01/2011  . Hypertension     takes Atenolol daily  . Emphysema   . Bronchitis     hx  . Adenocarcinoma of gastroesophageal junction   . Dizziness     d/t crystals in ears that "roll around"and can't get them out  . Back pain   . Osteoporosis   . Scoliosis   . Psoriasis   . H/O hiatal hernia   . GERD (gastroesophageal reflux disease)     takes Aciphex daily  . Gastric ulcer   . Constipation     since Chemo and Radiation;finished up in Nov 2012  . Diverticulosis   . History of colonic polyps   . Shingles     broke out 2wks ago;has been on meds and to finish up tomorrow  . Kidney stone 25+yrs ago  . Pernicious anemia   . Vitamin B deficiency   . Complication of anesthesia     shortness of breath and pain with gas   . COPD (chronic obstructive pulmonary disease)   . Shortness of breath     with exertion   . Blood transfusion     Past Surgical History  Procedure Laterality Date  . Right hip replacement  2001  . Tonsillectomy      as a child  . Tubal ligation  1975  . Appendectomy  1975  . Cataract surgery  10+yrs ago   . Esophagogastroduodenoscopy    . Colonoscopy    . Extubation  12/20/2011       . Partial esophagectomy  12/20/2011    Procedure: ESOPHAGECTOMY PARTIAL;  Surgeon: Norton Blizzard, MD;  Location: [facility] OR;  Service: Thoracic;  Laterality: N/A;  . Total hip arthroplasty  06/30/2012    Procedure: TOTAL HIP ARTHROPLASTY ANTERIOR APPROACH;  Surgeon: Kathryne Hitch, MD;  Location: [facility] ORS;  Service: Orthopedics;  Laterality: Left;  Left total hip arthroplasty    Family History  Problem Relation Age of Onset  . Cancer Maternal Grandmother     gynecologic  . Cancer Maternal Aunt     gynecologic  . Breast cancer Paternal Aunt   . Breast cancer Paternal Aunt   . Breast cancer Paternal Aunt   . Anesthesia problems Neg Hx   . Hypotension Neg Hx   . Malignant hyperthermia Neg Hx   . Pseudochol deficiency Neg Hx     History  Substance Use Topics  . Smoking status: Former Smoker    Quit date: 12/07/1991  . Smokeless tobacco: Never Used  . Alcohol Use: No    OB History   Grav Para Term Preterm Abortions TAB SAB Ect Mult  Living                  Review of Systems  Constitutional: Negative for fever and chills.  Respiratory: Negative for shortness of breath.   Cardiovascular: Negative for chest pain.  Gastrointestinal: Positive for nausea, abdominal pain and diarrhea. Negative for vomiting, constipation and blood in stool.  Genitourinary: Negative for dysuria and flank pain.  Musculoskeletal: Negative for myalgias.  Skin: Negative for rash and wound.  Neurological: Positive for weakness. Negative for dizziness, syncope, light-headedness, numbness and headaches.  All other systems reviewed and are negative.    Allergies  Adhesive; Clarithromycin; Darvon; Epinephrine; Morphine and related; Oxycodone; Oxycodone hcl er; Sulfa antibiotics; and Vicodin  Home Medications   Current Outpatient Rx  Name  Route  Sig  Dispense  Refill  . acetaminophen (TYLENOL) 500 MG tablet   Oral    Take 1,000 mg by mouth every 6 (six) hours as needed. PAIN         . atenolol (TENORMIN) 50 MG tablet   Oral   Take 50 mg by mouth 2 (two) times daily.         Marland Kitchen CALCIUM-VITAMIN D-VITAMIN K PO   Oral   Take 1 tablet by mouth 2 (two) times daily.         . diphenhydrAMINE (BENADRYL) 25 MG tablet   Oral   Take 25 mg by mouth See admin instructions. TAKE 1 TABLET 30 MINUTES PRIOR TO HYDROCODONE         . esomeprazole (NEXIUM) 40 MG capsule   Oral   Take 40 mg by mouth daily before breakfast.         . HYDROcodone-acetaminophen (NORCO/VICODIN) 5-325 MG per tablet   Oral   Take 1 tablet by mouth every 12 (twelve) hours.         Marland Kitchen lisinopril (PRINIVIL,ZESTRIL) 40 MG tablet   Oral   Take 40 mg by mouth every morning.          Marland Kitchen OVER THE COUNTER MEDICATION   Oral   Take 1 tablet by mouth 2 (two) times daily. [facility] WORT         . predniSONE (DELTASONE) 1 MG tablet   Oral   Take 2 mg by mouth every morning. 2 TABLETS A DAY         . raloxifene (EVISTA) 60 MG tablet   Oral   Take 60 mg by mouth every morning.          . tiotropium (SPIRIVA) 18 MCG inhalation capsule   Inhalation   Place 18 mcg into inhaler and inhale every morning.          Marland Kitchen tiZANidine (ZANAFLEX) 4 MG tablet   Oral   Take 2 mg by mouth every 6 (six) hours as needed. TAKES 1/2 TABLET-MUSCLE STIFFNESS         . traMADol (ULTRAM) 50 MG tablet   Oral   Take 50 mg by mouth every 12 (twelve) hours as needed for pain.            BP 134/63  Pulse 77  Temp(Src) 98.1 F (36.7 C) (Oral)  Resp 20  SpO2 94%  Physical Exam  Nursing note and vitals reviewed. Constitutional: She is oriented to person, place, and time. She appears well-developed and well-nourished. No distress.  HENT:  Head: Normocephalic and atraumatic.  Mouth/Throat: Oropharynx is clear and moist.  Eyes: EOM are normal. Pupils are equal, round, and reactive to light.  Neck: Normal range of motion. Neck supple.   Cardiovascular: Normal rate and regular rhythm.   Pulmonary/Chest: Effort normal and breath sounds normal. No respiratory distress. She has no wheezes. She has no rales. She exhibits no tenderness.  Abdominal: Soft. Bowel sounds are normal. She exhibits no distension and no mass. There is tenderness (mild LUQ tenderness with rebound or guarding. Pt states this is her baseline pain. ). There is no rebound and no guarding.  Musculoskeletal: Normal range of motion. She exhibits no edema and no tenderness.  Neurological: She is alert and oriented to person, place, and time.  Skin: Skin is warm and dry. No rash noted. No erythema.  Psychiatric: She has a normal mood and affect. Her behavior is normal.    ED Course  Procedures (including critical care time)  Labs Reviewed  CBC WITH DIFFERENTIAL - Abnormal; Notable for the following:    WBC 18.8 (*)    RBC 5.12 (*)    HCT 46.7 (*)    Neutrophils Relative 84 (*)    Neutro Abs 15.9 (*)    Lymphocytes Relative 6 (*)    Monocytes Absolute 1.8 (*)    All other components within normal limits  COMPREHENSIVE METABOLIC PANEL - Abnormal; Notable for the following:    Glucose, Bld 161 (*)    Creatinine, Ser 1.15 (*)    Albumin 3.2 (*)    Total Bilirubin 0.2 (*)    GFR calc non Af Amer 45 (*)    GFR calc Af Amer 52 (*)    All other components within normal limits  URINALYSIS, ROUTINE W REFLEX MICROSCOPIC - Abnormal; Notable for the following:    Hgb urine dipstick TRACE (*)    Leukocytes, UA TRACE (*)    All other components within normal limits  URINE MICROSCOPIC-ADD ON - Abnormal; Notable for the following:    Squamous Epithelial / LPF FEW (*)    Bacteria, UA FEW (*)    Casts HYALINE CASTS (*)    All other components within normal limits  CLOSTRIDIUM DIFFICILE BY PCR  URINE CULTURE  TROPONIN I  LIPASE, BLOOD   Dg Abd Acute W/chest  02/16/2013  *RADIOLOGY REPORT*  Clinical Data: Abdominal pain and nausea.  Diarrhea.  History of  hypertension, emphysema, hiatal hernia, GERD, diverticulosis, partial esophagectomy for carcinoma of the gastroesophageal junction.  ACUTE ABDOMEN SERIES (ABDOMEN 2 VIEW & CHEST 1 VIEW)  Comparison: Chest x-ray 11/23/2012  Findings: Heart size is upper limits normal.  The lungs are free of focal consolidations and pleural effusions.  Mild perihilar bronchitic changes are present.  No edema.  No free intraperitoneal air identified.  Surgical clips are identified in the left upper quadrant of the abdomen. Bowel gas pattern is nonobstructive.  There is significant heterotopic bone surrounding both hips.  Previous bilateral hip arthroplasty.  There is S-shaped scoliosis of the thoracolumbar spine. Previous vertebral plasty.  IMPRESSION:  1.  Mild bronchitic change. 2. No focal pulmonary abnormality. 3.  Nonobstructive bowel gas pattern.   Original Report Authenticated By: Norva Pavlov, M.D.      1. Diarrhea   2. Nausea       MDM   No diarrhea in ED. Pt asking to be d/c'd home. Leukocytosis found without source. Possibly acute phase reaction. Pt given 2 dose of chronic pain medications. VS remain stable. Given return precuations.        Loren Racer, MD 02/16/13 [phone]

## 2013-02-16 NOTE — Progress Notes (Signed)
Spoke to Dr. Foy Guadalajara regarding her status and he suggest she wait and she how she feels in about a half hour and if symptoms continue that pt. needs to go to the ER.

## 2013-02-16 NOTE — Progress Notes (Signed)
Pt states she is nauseated, and c/o of abd pain and needing to have a BM.  Pt is having a lot of flatulence.

## 2013-02-16 NOTE — Progress Notes (Signed)
Pt having large BM with loose stool associated.  Loose stool seems to be uncontrollable and having incontinent episodes between bedpan exchange.

## 2013-02-16 NOTE — ED Notes (Signed)
Pt from short stay.  Was having iron transfusion when she began to have multiple episodes of diarrhea.  Pt denies emesis, but states she is starting to get nauseous.  Pt states she felt fine upon arrival to short stay.  Pt ate some chicken during transfusion.

## 2013-02-16 NOTE — ED Notes (Signed)
[id number]> Expected date:<BR> Expected time:<BR> Means of arrival:<BR> Comments:<BR> Hold pt from out pt

## 2013-02-16 NOTE — Progress Notes (Signed)
Pt is now resting and states she is feeling some better.

## 2013-02-18 LAB — URINE CULTURE: Colony Count: 40000

## 2013-03-14 ENCOUNTER — Other Ambulatory Visit (HOSPITAL_COMMUNITY): Payer: Self-pay | Admitting: *Deleted

## 2013-03-15 ENCOUNTER — Other Ambulatory Visit (HOSPITAL_COMMUNITY): Payer: Self-pay | Admitting: Family Medicine

## 2013-03-16 ENCOUNTER — Encounter (HOSPITAL_COMMUNITY): Payer: Self-pay | Admitting: Emergency Medicine

## 2013-03-16 ENCOUNTER — Emergency Department (HOSPITAL_COMMUNITY)
Admission: EM | Admit: 2013-03-16 | Discharge: 2013-03-16 | Disposition: A | Discharge status: Home / Self Care | Payer: Medicare Other | Attending: Emergency Medicine | Admitting: Emergency Medicine

## 2013-03-16 ENCOUNTER — Encounter (HOSPITAL_COMMUNITY)
Admission: RE | Admit: 2013-03-16 | Discharge: 2013-03-16 | Disposition: A | Discharge status: Home / Self Care | Payer: Medicare Other | Source: Ambulatory Visit | Attending: Family Medicine | Admitting: Family Medicine

## 2013-03-16 DIAGNOSIS — R197 Diarrhea, unspecified: Secondary | ICD-10-CM | POA: Insufficient documentation

## 2013-03-16 DIAGNOSIS — Z8639 Personal history of other endocrine, nutritional and metabolic disease: Secondary | ICD-10-CM | POA: Insufficient documentation

## 2013-03-16 DIAGNOSIS — D509 Iron deficiency anemia, unspecified: Secondary | ICD-10-CM | POA: Insufficient documentation

## 2013-03-16 DIAGNOSIS — Z8509 Personal history of malignant neoplasm of other digestive organs: Secondary | ICD-10-CM | POA: Insufficient documentation

## 2013-03-16 DIAGNOSIS — R110 Nausea: Secondary | ICD-10-CM | POA: Insufficient documentation

## 2013-03-16 DIAGNOSIS — Z860100 Personal history of colon polyps, unspecified: Secondary | ICD-10-CM | POA: Insufficient documentation

## 2013-03-16 DIAGNOSIS — M1990 Unspecified osteoarthritis, unspecified site: Secondary | ICD-10-CM | POA: Insufficient documentation

## 2013-03-16 DIAGNOSIS — Z8739 Personal history of other diseases of the musculoskeletal system and connective tissue: Secondary | ICD-10-CM | POA: Insufficient documentation

## 2013-03-16 DIAGNOSIS — K3189 Other diseases of stomach and duodenum: Secondary | ICD-10-CM | POA: Insufficient documentation

## 2013-03-16 DIAGNOSIS — K219 Gastro-esophageal reflux disease without esophagitis: Secondary | ICD-10-CM | POA: Insufficient documentation

## 2013-03-16 DIAGNOSIS — R5381 Other malaise: Secondary | ICD-10-CM | POA: Insufficient documentation

## 2013-03-16 DIAGNOSIS — Z8709 Personal history of other diseases of the respiratory system: Secondary | ICD-10-CM | POA: Insufficient documentation

## 2013-03-16 DIAGNOSIS — Z87442 Personal history of urinary calculi: Secondary | ICD-10-CM | POA: Insufficient documentation

## 2013-03-16 DIAGNOSIS — I10 Essential (primary) hypertension: Secondary | ICD-10-CM | POA: Insufficient documentation

## 2013-03-16 DIAGNOSIS — Z8669 Personal history of other diseases of the nervous system and sense organs: Secondary | ICD-10-CM | POA: Insufficient documentation

## 2013-03-16 DIAGNOSIS — Z872 Personal history of diseases of the skin and subcutaneous tissue: Secondary | ICD-10-CM | POA: Insufficient documentation

## 2013-03-16 DIAGNOSIS — J4489 Other specified chronic obstructive pulmonary disease: Secondary | ICD-10-CM | POA: Insufficient documentation

## 2013-03-16 DIAGNOSIS — Z79899 Other long term (current) drug therapy: Secondary | ICD-10-CM | POA: Insufficient documentation

## 2013-03-16 DIAGNOSIS — T50905D Adverse effect of unspecified drugs, medicaments and biological substances, subsequent encounter: Secondary | ICD-10-CM

## 2013-03-16 DIAGNOSIS — M810 Age-related osteoporosis without current pathological fracture: Secondary | ICD-10-CM | POA: Insufficient documentation

## 2013-03-16 DIAGNOSIS — Z862 Personal history of diseases of the blood and blood-forming organs and certain disorders involving the immune mechanism: Secondary | ICD-10-CM | POA: Insufficient documentation

## 2013-03-16 DIAGNOSIS — Z87891 Personal history of nicotine dependence: Secondary | ICD-10-CM | POA: Insufficient documentation

## 2013-03-16 DIAGNOSIS — T8089XA Other complications following infusion, transfusion and therapeutic injection, initial encounter: Secondary | ICD-10-CM | POA: Insufficient documentation

## 2013-03-16 DIAGNOSIS — Y849 Medical procedure, unspecified as the cause of abnormal reaction of the patient, or of later complication, without mention of misadventure at the time of the procedure: Secondary | ICD-10-CM | POA: Insufficient documentation

## 2013-03-16 DIAGNOSIS — Z8719 Personal history of other diseases of the digestive system: Secondary | ICD-10-CM | POA: Insufficient documentation

## 2013-03-16 DIAGNOSIS — R141 Gas pain: Secondary | ICD-10-CM | POA: Insufficient documentation

## 2013-03-16 DIAGNOSIS — R55 Syncope and collapse: Secondary | ICD-10-CM | POA: Insufficient documentation

## 2013-03-16 DIAGNOSIS — R142 Eructation: Secondary | ICD-10-CM | POA: Insufficient documentation

## 2013-03-16 DIAGNOSIS — J449 Chronic obstructive pulmonary disease, unspecified: Secondary | ICD-10-CM | POA: Insufficient documentation

## 2013-03-16 DIAGNOSIS — R109 Unspecified abdominal pain: Secondary | ICD-10-CM | POA: Insufficient documentation

## 2013-03-16 LAB — CBC WITH DIFFERENTIAL/PLATELET
Basophils Absolute: 0 10*3/uL (ref 0.0–0.1)
Basophils Relative: 0 % (ref 0–1)
Eosinophils Absolute: 0.1 10*3/uL (ref 0.0–0.7)
Eosinophils Relative: 1 % (ref 0–5)
Lymphs Abs: 2.1 10*3/uL (ref 0.7–4.0)
MCH: 29.4 pg (ref 26.0–34.0)
MCHC: 31.8 g/dL (ref 30.0–36.0)
MCV: 92.3 fL (ref 78.0–100.0)
Neutrophils Relative %: 82 % — ABNORMAL HIGH (ref 43–77)
Platelets: 295 10*3/uL (ref 150–400)
RBC: 4.8 MIL/uL (ref 3.87–5.11)
RDW: 14.9 % (ref 11.5–15.5)

## 2013-03-16 LAB — COMPREHENSIVE METABOLIC PANEL
ALT: 6 U/L (ref 0–35)
AST: 21 U/L (ref 0–37)
Albumin: 2.8 g/dL — ABNORMAL LOW (ref 3.5–5.2)
Alkaline Phosphatase: 71 U/L (ref 39–117)
Calcium: 8.7 mg/dL (ref 8.4–10.5)
GFR calc Af Amer: 56 mL/min — ABNORMAL LOW (ref >90)
Glucose, Bld: 188 mg/dL — ABNORMAL HIGH (ref 70–99)
Potassium: 4.6 mEq/L (ref 3.5–5.1)
Sodium: 137 mEq/L (ref 135–145)
Total Protein: 6.1 g/dL (ref 6.0–8.3)

## 2013-03-16 LAB — GLUCOSE, CAPILLARY: Glucose-Capillary: 197 mg/dL — ABNORMAL HIGH (ref 70–99)

## 2013-03-16 MED ORDER — SODIUM CHLORIDE 0.9 % IV SOLN
Freq: Once | INTRAVENOUS | Status: AC
  Administered 2013-03-16: 11:00:00 via INTRAVENOUS

## 2013-03-16 MED ORDER — DIPHENHYDRAMINE HCL 50 MG/ML IJ SOLN: 12.5000 mg | INTRAMUSCULAR | Status: DC | PRN

## 2013-03-16 MED ORDER — SODIUM CHLORIDE 0.9 % IV BOLUS (SEPSIS)
500.0000 mL | Freq: Once | INTRAVENOUS | Status: AC
  Administered 2013-03-16: 500 mL via INTRAVENOUS

## 2013-03-16 MED ORDER — ONDANSETRON HCL 4 MG/2ML IJ SOLN
4.0000 mg | Freq: Once | INTRAMUSCULAR | Status: AC
  Administered 2013-03-16: 4 mg via INTRAVENOUS

## 2013-03-16 MED ORDER — ONDANSETRON HCL 4 MG/2ML IJ SOLN
4.0000 mg | Freq: Once | INTRAMUSCULAR | Status: DC
  Filled 2013-03-16: qty 2

## 2013-03-16 MED ORDER — SODIUM CHLORIDE 0.9 % IV SOLN
250.0000 mg | Freq: Once | INTRAVENOUS | Status: AC
  Administered 2013-03-16: 250 mg via INTRAVENOUS
  Filled 2013-03-16: qty 20

## 2013-03-16 NOTE — Progress Notes (Signed)
Patient is alert butBP while on toilet was 75/47 p 100 and we insisted on getting her off toilet via STEADY to stretcher .As we were moving patient she became unresponsive but continued to have a pulse and respirations. Moved patient to stretcher and placed her in Trendelingberg position. Placed call to Rapid response Team for assistance. After about 1 minute pt is becoming responsive  And BP has come up to  88/56 and pt is responding to name. Placed patient in room and added O2 4 liters and IV Fluids of ns at 999 cc/hr.

## 2013-03-16 NOTE — ED Provider Notes (Addendum)
History     CSN: [id number]  Arrival date & time 03/16/13  1410   First MD Initiated Contact with Patient 03/16/13 1418      Chief Complaint  Patient presents with  . Allergic Reaction  . Nausea    (Consider location/radiation/quality/duration/timing/severity/associated sxs/prior treatment) HPI I evaluated pt for similar episode after iron infusion last month. Today after infusion she began to have stomach cramps, diarreha and nausea. She then had a brief syncopal episode. Was noted to be hypotensive. Pt states she now feels.  Past Medical History  Diagnosis Date  . Pernicious anemia   . Osteoarthritis   . Fibromyalgia   . Skin lesion     chronic calcific cutaneous lesions  . HX of multiple bleeding ulcers 09/01/2011  . Hypertension     takes Atenolol daily  . Emphysema   . Bronchitis     hx  . Adenocarcinoma of gastroesophageal junction   . Dizziness     d/t crystals in ears that "roll around"and can't get them out  . Back pain   . Osteoporosis   . Scoliosis   . Psoriasis   . H/O hiatal hernia   . GERD (gastroesophageal reflux disease)     takes Aciphex daily  . Gastric ulcer   . Constipation     since Chemo and Radiation;finished up in Nov 2012  . Diverticulosis   . History of colonic polyps   . Shingles     broke out 2wks ago;has been on meds and to finish up tomorrow  . Kidney stone 25+yrs ago  . Pernicious anemia   . Vitamin B deficiency   . Complication of anesthesia     shortness of breath and pain with gas   . COPD (chronic obstructive pulmonary disease)   . Shortness of breath     with exertion   . Blood transfusion     Past Surgical History  Procedure Laterality Date  . Right hip replacement  2001  . Tonsillectomy      as a child  . Tubal ligation  1975  . Appendectomy  1975  . Cataract surgery  10+yrs ago  . Esophagogastroduodenoscopy    . Colonoscopy    . Extubation  12/20/2011       . Partial esophagectomy  12/20/2011    Procedure:  ESOPHAGECTOMY PARTIAL;  Surgeon: Norton Blizzard, MD;  Location: [facility] OR;  Service: Thoracic;  Laterality: N/A;  . Total hip arthroplasty  06/30/2012    Procedure: TOTAL HIP ARTHROPLASTY ANTERIOR APPROACH;  Surgeon: Kathryne Hitch, MD;  Location: [facility] ORS;  Service: Orthopedics;  Laterality: Left;  Left total hip arthroplasty    Family History  Problem Relation Age of Onset  . Cancer Maternal Grandmother     gynecologic  . Cancer Maternal Aunt     gynecologic  . Breast cancer Paternal Aunt   . Breast cancer Paternal Aunt   . Breast cancer Paternal Aunt   . Anesthesia problems Neg Hx   . Hypotension Neg Hx   . Malignant hyperthermia Neg Hx   . Pseudochol deficiency Neg Hx     History  Substance Use Topics  . Smoking status: Former Smoker    Quit date: 12/07/1991  . Smokeless tobacco: Never Used  . Alcohol Use: No    OB History   Grav Para Term Preterm Abortions TAB SAB Ect Mult Living                  Review  of Systems  Constitutional: Negative for fever and chills.  HENT: Negative for facial swelling.   Respiratory: Negative for shortness of breath.   Gastrointestinal: Positive for nausea, vomiting and abdominal pain.  Neurological: Positive for dizziness, weakness and light-headedness.  All other systems reviewed and are negative.    Allergies  Nulecit; Adhesive; Clarithromycin; Darvon; Epinephrine; Morphine and related; Oxycodone; Oxycodone hcl er; Sulfa antibiotics; and Vicodin  Home Medications   Current Outpatient Rx  Name  Route  Sig  Dispense  Refill  . acetaminophen (TYLENOL) 500 MG tablet   Oral   Take 1,000 mg by mouth every 6 (six) hours as needed. PAIN         . albuterol (PROVENTIL HFA;VENTOLIN HFA) 108 (90 BASE) MCG/ACT inhaler   Inhalation   Inhale 2 puffs into the lungs every 6 (six) hours as needed for wheezing (wheezing).         Marland Kitchen atenolol (TENORMIN) 50 MG tablet   Oral   Take 50 mg by mouth 2 (two) times daily.         Marland Kitchen  CALCIUM-VITAMIN D-VITAMIN K PO   Oral   Take 1 tablet by mouth 2 (two) times daily.         . diphenhydrAMINE (BENADRYL) 25 MG tablet   Oral   Take 25 mg by mouth See admin instructions. TAKE 1 TABLET 30 MINUTES PRIOR TO HYDROCODONE         . esomeprazole (NEXIUM) 40 MG capsule   Oral   Take 40 mg by mouth daily before breakfast.         . HYDROcodone-acetaminophen (NORCO/VICODIN) 5-325 MG per tablet   Oral   Take 1 tablet by mouth every 12 (twelve) hours.         Marland Kitchen lisinopril (PRINIVIL,ZESTRIL) 40 MG tablet   Oral   Take 40 mg by mouth every morning.          Marland Kitchen OVER THE COUNTER MEDICATION   Oral   Take 1 tablet by mouth 2 (two) times daily. [facility]ORT         . raloxifene (EVISTA) 60 MG tablet   Oral   Take 60 mg by mouth every morning.          . tiotropium (SPIRIVA) 18 MCG inhalation capsule   Inhalation   Place 18 mcg into inhaler and inhale every morning.          Marland Kitchen tiZANidine (ZANAFLEX) 4 MG tablet   Oral   Take 2 mg by mouth every 6 (six) hours as needed. TAKES 1/2 TABLET-MUSCLE STIFFNESS         . traMADol (ULTRAM) 50 MG tablet   Oral   Take 50 mg by mouth every 12 (twelve) hours as needed for pain.          Marland Kitchen LORazepam (ATIVAN) 0.5 MG tablet      Takes as needed for anxiety         . predniSONE (DELTASONE) 1 MG tablet   Oral   Take by mouth. Takes 2 tablets daily         . raloxifene (EVISTA) 60 MG tablet      daily.           BP 127/63  Pulse 69  Temp(Src) 97.8 F (36.6 C) (Rectal)  Resp 12  SpO2 98%  Physical Exam  Nursing note and vitals reviewed. Constitutional: She is oriented to person, place, and time. She appears well-developed and well-nourished. No  distress.  HENT:  Head: Normocephalic and atraumatic.  Mouth/Throat: Oropharynx is clear and moist.  Eyes: EOM are normal. Pupils are equal, round, and reactive to light.  Neck: Normal range of motion. Neck supple.  Cardiovascular: Normal rate and regular  rhythm.   Pulmonary/Chest: Effort normal and breath sounds normal. No respiratory distress. She has no wheezes. She has no rales.  Abdominal: Soft. Bowel sounds are normal. She exhibits no distension and no mass. There is tenderness (mild generalized abd tenderness without focality). There is no rebound and no guarding.  Musculoskeletal: Normal range of motion. She exhibits no edema and no tenderness.  Neurological: She is alert and oriented to person, place, and time.  5/5 motor in all ext, sensation intact  Skin: Skin is warm and dry. No rash noted. No erythema.  Psychiatric: She has a normal mood and affect. Her behavior is normal.    ED Course  Procedures (including critical care time)  Labs Reviewed  GLUCOSE, CAPILLARY - Abnormal; Notable for the following:    Glucose-Capillary 197 (*)    All other components within normal limits  CBC WITH DIFFERENTIAL - Abnormal; Notable for the following:    WBC 17.1 (*)    Neutrophils Relative 82 (*)    Neutro Abs 14.0 (*)    All other components within normal limits  COMPREHENSIVE METABOLIC PANEL - Abnormal; Notable for the following:    Glucose, Bld 188 (*)    Albumin 2.8 (*)    Total Bilirubin 0.2 (*)    GFR calc non Af Amer 48 (*)    GFR calc Af Amer 56 (*)    All other components within normal limits  TROPONIN I   No results found.   1. Medication reaction, subsequent encounter      Date: 03/16/2013  Rate: 89  Rhythm: normal sinus rhythm  QRS Axis: normal  Intervals: normal  ST/T Wave abnormalities: normal  Conduction Disutrbances:none  Narrative Interpretation:   Old EKG Reviewed: unchanged    MDM   Pt stable in ED. Requesting D/C home. Advised to avoid Iron infusion and return for any concerns       Loren Racer, MD 03/16/13 1528  Loren Racer, MD 04/04/13 [phone]

## 2013-03-16 NOTE — Progress Notes (Signed)
RRT Nurse here and reviewed symptoms . Pt is becoming more alert and bp 95/56 p93 r 18 O2 sta 97 % 4 liters Placed call to Dr Foy Guadalajara and he would like patient to go to ED. He also wants Ferlicet added to allergy list.

## 2013-03-16 NOTE — ED Notes (Signed)
1L NS infusing at moment.

## 2013-03-16 NOTE — ED Notes (Signed)
RUE:[id number]<JW> Expected date:<BR> Expected time:<BR> Means of arrival:<BR> Comments:<BR> Hold short stay pt coming

## 2013-03-16 NOTE — Progress Notes (Signed)
At the end of the Ferrlicet infusion patient wanted to go to the bathroom. Assisted patient to the BR and she began to have diarrhea and c/o of stomach cramping and nausea. I feel so sick. Tried to get patient back to recliner and she refused and refused to allow BP to be checked.Placed call to Dr Foy Guadalajara to alert him of this

## 2013-03-16 NOTE — Progress Notes (Signed)
BP currently 91/58 O2 sa 96 p 93 warmer and pnk ,. States she is still having some cramping and nausea HOB flat Taking patient ot ED as requested by Dr Foy Guadalajara

## 2013-03-16 NOTE — ED Notes (Signed)
Pt was in short stay and had an iron infusion and began having n/d and this same thing happened a few weeks ago again. Hypotensive, clammy, iv 22g rt ac,

## 2013-03-22 ENCOUNTER — Telehealth: Payer: Self-pay | Admitting: Oncology

## 2013-03-22 ENCOUNTER — Ambulatory Visit (HOSPITAL_BASED_OUTPATIENT_CLINIC_OR_DEPARTMENT_OTHER): Payer: Medicare Other | Admitting: Oncology

## 2013-03-22 VITALS — BP 172/88 | HR 84 | Temp 96.9°F | Resp 18 | Ht 62.0 in | Wt 138.8 lb

## 2013-03-22 DIAGNOSIS — C160 Malignant neoplasm of cardia: Secondary | ICD-10-CM

## 2013-03-22 NOTE — Progress Notes (Signed)
   [facility]    OFFICE PROGRESS NOTE   INTERVAL HISTORY:   She returns as scheduled. She reports significant improvement in the back pain since undergoing a vertebroplasty at T9 in February. She reports a "reaction "when she was given IV iron in March and April. Emergency room records indicate she developed nausea and diarrhea following IV iron with a "syncope "event following the most recent infusion. Dysphagia has improved.  Objective:  Vital signs in last 24 hours:  Blood pressure 172/88, pulse 84, temperature 96.9 F (36.1 C), temperature source Oral, resp. rate 18, height 5\' 2"  (1.575 m), weight 138 lb 12.8 oz (62.959 kg), SpO2 95.00%.    HEENT: Neck without mass Lymphatics: No cervical, supraclavicular, or axillary nodes Resp: Lungs clear bilaterally Cardio: Regular rate and rhythm GI: No hepatomegaly Vascular: No leg edema   Lab Results:  Lab Results  Component Value Date   WBC 17.1* 03/16/2013   HGB 14.1 03/16/2013   HCT 44.3 03/16/2013   MCV 92.3 03/16/2013   PLT 295 03/16/2013      Medications: I have reviewed the patient's current medications.  Assessment/Plan: 1. 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 Staging CT scan showed no evidence for distant metastatic disease. Staging PET scan 08/20/2011 revealed hypermetabolic activity at the distal esophagus and no evidence for metastatic disease. She completed radiation 08/26/2011 through 10/04/2011 and weekly Taxol/carboplatin chemotherapy 08/31/2011 through 10/04/2011. -restaging PET scan 11/05/2011 confirmed a decrease in the FDG activity at the distal esophagus with no evidence for metastatic disease. Status post esophagogastrectomy 12/20/2011 with pathology showing invasive adenocarcinoma, moderately differentiated, scattered foci, the largest spanning 0.26 cm; adenocarcinoma involved the  muscularis mucosa; the surgical resection margins were negative; no evidence of carcinoma in 9 of 9 lymph nodes. Restaging CT scans 12/12/2012 showed no evidence of recurrent or metastatic disease.  #2 Solid/liquid dysphagia, subxiphoid discomfort, and weight loss secondary to esophagus cancer. There was initial improvement in the dysphagia. She had recurrent dysphagia. Esophagram on 09/18/2012 revealed no mass or obstruction. Poor motility was noted in the gastric pull-through.  #3 Subserosal gastrointestinal stromal tumor, low-grade, spanning 0.8 cm noted on pathology 12/20/2011.  #4 Status post left total hip replacement 06/30/2012.  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 Resolved.  #6 Chronic anemia with a history of chronic gastrointestinal blood loss. The hemoglobin was in normal range on 11/21/2012.  #7 Osteoarthritis.  #8 "Pernicious" anemia.  #9 Fibromyalgia.  #10 Chronic "calcific" cutaneous lesions.  #11 Fever and erythema with associated tenderness involving one of the calcific cutaneous lesions at the left anterior lateral leg on 09/21/2011. Improved following Keflex therapy.  #12 Multiple medication allergies.  #13 Hypertension.  #14 Superficial phlebitis right arm on 10/18/2011.  #15 Abdominal wall/incisional hernia.  #16 Compression fracture at T8 and T9, likely osteoporotic, status post a T8 vertebroplasty 01/10/2013. T9 vertebroplasty 01/29/2013 #17 status post a referral to the urology Center for evaluation of flank pain #18 "reaction "to IV iron in March and April of 2014    Disposition:  [name] remains in clinical remission from esophagus cancer. It appears the back pain was related to osteoporotic compression fractures. The pain has improved significantly since undergoing vertebroplasty procedures. She will return for an office visit in 4 months. I cautioned her to avoid IV iron infusions.   Yolanda Papas, MD  03/22/2013  1:45 PM

## 2013-06-21 ENCOUNTER — Encounter: Payer: Medicare Other | Admitting: Cardiothoracic Surgery

## 2013-06-22 ENCOUNTER — Encounter: Payer: Medicare Other | Admitting: Cardiothoracic Surgery

## 2013-07-20 ENCOUNTER — Ambulatory Visit (HOSPITAL_BASED_OUTPATIENT_CLINIC_OR_DEPARTMENT_OTHER): Payer: Medicare Other | Admitting: Nurse Practitioner

## 2013-07-20 ENCOUNTER — Telehealth: Payer: Self-pay | Admitting: Oncology

## 2013-07-20 VITALS — BP 148/73 | HR 74 | Temp 98.0°F | Resp 18 | Ht 62.0 in | Wt 135.9 lb

## 2013-07-20 DIAGNOSIS — C160 Malignant neoplasm of cardia: Secondary | ICD-10-CM

## 2013-07-20 DIAGNOSIS — R1319 Other dysphagia: Secondary | ICD-10-CM

## 2013-07-20 NOTE — Telephone Encounter (Signed)
gave pt appt for MD only on March 2015

## 2013-07-20 NOTE — Progress Notes (Signed)
OFFICE PROGRESS NOTE  Interval history:  [name] returns as scheduled. Back pain continues to be improved. She has a good appetite. Food periodically gets "stuck". This has been occurring since surgery and is unchanged. No bowel or bladder problems.   Objective: Blood pressure 148/73, pulse 74, temperature 98 F (36.7 C), temperature source Oral, resp. rate 18, height 5\' 2"  (1.575 m), weight 135 lb 14.4 oz (61.644 kg).  No thrush. No palpable cervical, supraclavicular or axillary lymph nodes. Lungs are clear. Regular cardiac rhythm. Abdomen soft. No hepatomegaly. No leg edema. Back is kyphotic.  Lab Results: Lab Results  Component Value Date   WBC 17.1* 03/16/2013   HGB 14.1 03/16/2013   HCT 44.3 03/16/2013   MCV 92.3 03/16/2013   PLT 295 03/16/2013    Chemistry:    Chemistry      Component Value Date/Time   NA 137 03/16/2013 1430   NA 138 01/23/2013 1342   K 4.6 03/16/2013 1430   K 4.1 01/23/2013 1342   CL 107 03/16/2013 1430   CL 106 01/23/2013 1342   CO2 20 03/16/2013 1430   CO2 24 01/23/2013 1342   BUN 14 03/16/2013 1430   BUN 13.8 01/23/2013 1342   CREATININE 1.09 03/16/2013 1430   CREATININE 0.8 01/23/2013 1342      Component Value Date/Time   CALCIUM 8.7 03/16/2013 1430   CALCIUM 9.5 01/23/2013 1342   ALKPHOS 71 03/16/2013 1430   ALKPHOS 91 01/23/2013 1342   AST 21 03/16/2013 1430   AST 11 01/23/2013 1342   ALT 6 03/16/2013 1430   ALT <6 Repeated and Verified 01/23/2013 1342   BILITOT 0.2* 03/16/2013 1430   BILITOT 0.23 01/23/2013 1342       Studies/Results: No results found.  Medications: I have reviewed the patient's current medications.  Assessment/Plan:  1. 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 Staging CT scan showed no evidence for distant metastatic disease. Staging PET scan 08/20/2011 revealed hypermetabolic activity at the distal esophagus and no  evidence for metastatic disease. She completed radiation 08/26/2011 through 10/04/2011 and weekly Taxol/carboplatin chemotherapy 08/31/2011 through 10/04/2011. -restaging PET scan 11/05/2011 confirmed a decrease in the FDG activity at the distal esophagus with no evidence for metastatic disease. Status post esophagogastrectomy 12/20/2011 with pathology showing invasive adenocarcinoma, moderately differentiated, scattered foci, the largest spanning 0.26 cm; adenocarcinoma involved the muscularis mucosa; the surgical resection margins were negative; no evidence of carcinoma in 9 of 9 lymph nodes. Restaging CT scans 12/12/2012 showed no evidence of recurrent or metastatic disease.  #2 Solid/liquid dysphagia, subxiphoid discomfort, and weight loss secondary to esophagus cancer. There was initial improvement in the dysphagia. She had recurrent dysphagia. Esophagram on 09/18/2012 revealed no mass or obstruction. Poor motility was noted in the gastric pull-through.  #3 Subserosal gastrointestinal stromal tumor, low-grade, spanning 0.8 cm noted on pathology 12/20/2011.  #4 Status post left total hip replacement 06/30/2012.  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 Resolved.  #6 Chronic anemia with a history of chronic gastrointestinal blood loss. The hemoglobin was in normal range on 11/21/2012.  #7 Osteoarthritis.  #8 "Pernicious" anemia.  #9 Fibromyalgia.  #10 Chronic "calcific" cutaneous lesions.  #11 Fever and erythema with associated tenderness involving one of the calcific cutaneous lesions at the left anterior lateral leg on 09/21/2011. Improved following Keflex therapy.  #12 Multiple medication allergies.  #13 Hypertension.  #14 Superficial phlebitis right arm on 10/18/2011.  #15 Abdominal wall/incisional hernia.  #  16 Compression fracture at T8 and T9, likely osteoporotic, status post a T8 vertebroplasty 01/10/2013. T9 vertebroplasty 01/29/2013  #17 status post a referral to the urology  Center for evaluation of flank pain  #18 "reaction "to IV iron in March and April of 2014. #19 Anemia. She has received IV iron in the past. Most recently she had "allergic reactions" to IV iron in March and April of this year. She reports seeing Dr. Dalene Carrow for this in the past. We will review those records.  Disposition-Ms. Kushnir remains in clinical remission from the esophagus cancer. She will return for a followup visit in 6 months. She will contact the office in the interim with any problems. As noted above, we will review the records from previous anemia evaluation.  Plan reviewed with Dr. Truett Perna.   Lonna Cobb ANP/GNP-BC

## 2013-09-20 ENCOUNTER — Ambulatory Visit (INDEPENDENT_AMBULATORY_CARE_PROVIDER_SITE_OTHER): Payer: Medicare Other | Admitting: Cardiothoracic Surgery

## 2013-09-20 ENCOUNTER — Encounter: Payer: Self-pay | Admitting: Cardiothoracic Surgery

## 2013-09-20 VITALS — BP 140/75 | HR 69 | Resp 20 | Ht 62.0 in | Wt 142.0 lb

## 2013-09-20 DIAGNOSIS — Z8501 Personal history of malignant neoplasm of esophagus: Secondary | ICD-10-CM

## 2013-09-20 DIAGNOSIS — R918 Other nonspecific abnormal finding of lung field: Secondary | ICD-10-CM

## 2013-09-20 NOTE — Progress Notes (Signed)
[address]            [place] [address]          [phone]         [name] [facility] Medical Record #[id number] Date of Birth: 07/26/1934  Referring: Ladene Artist, MD Primary Care: Lenora Boys, MD  Chief Complaint:    Chief Complaint  Patient presents with  . Follow-up    9 month f/u     History of Present Illness:    OPERATIVE REPORT  12/19/2011 PREOPERATIVE DIAGNOSIS: Stage IIIA adenocarcinoma of the esophagus  status post radiation and chemotherapy.  POSTOPERATIVE DIAGNOSIS: Stage IIIA adenocarcinoma of the esophagus  status post radiation and chemotherapy.  OPERATION PERFORMED: Transhiatal esophagogastrectomy, jejunostomy, and  pyloroplasty.  The patient returns to the office.Marland Kitchen She was last seen in the office she was complaining of significant back pain and some trouble swallowing. The concern was if she had recurrent esophageal cancer with bony metastasis after some difficulty in obtaining approval for a followup CT of the chest and abdomen from her insurance company these were carried out on January 7. By the CT scan there is no evidence of recurrent malignancy, but was evidence of compression fractures now that have been treated with as patient says pulling in cement. She has had some pain relief but still is limited by diffuse chronic pain. yesterday Dr.  She continues to be significantly limited by back pain with constant discomfort.   Current Activity/ Functional Status: Patient is not independent with mobility/ambulation, transfers, ADL's, IADL's.   Past Medical History  Diagnosis Date  . Pernicious anemia   . Osteoarthritis   . Fibromyalgia   . Skin lesion     chronic calcific cutaneous lesions  . HX of multiple bleeding ulcers 09/01/2011  . Hypertension     takes Atenolol daily  . Emphysema   . Bronchitis     hx  . Adenocarcinoma of gastroesophageal junction   . Dizziness     d/t  crystals in ears that "roll around"and can't get them out  . Back pain   . Osteoporosis   . Scoliosis   . Psoriasis   . H/O hiatal hernia   . GERD (gastroesophageal reflux disease)     takes Aciphex daily  . Gastric ulcer   . Constipation     since Chemo and Radiation;finished up in Nov 2012  . Diverticulosis   . History of colonic polyps   . Shingles     broke out 2wks ago;has been on meds and to finish up tomorrow  . Kidney stone 25+yrs ago  . Pernicious anemia   . Vitamin B deficiency   . Complication of anesthesia     shortness of breath and pain with gas   . COPD (chronic obstructive pulmonary disease)   . Shortness of breath     with exertion   . Blood transfusion     Past Surgical History  Procedure Laterality Date  . Right hip replacement  2001  . Tonsillectomy      as a child  . Tubal ligation  1975  . Appendectomy  1975  . Cataract surgery  10+yrs ago  . Esophagogastroduodenoscopy    . Colonoscopy    . Extubation  12/20/2011       .  Partial esophagectomy  12/20/2011    Procedure: ESOPHAGECTOMY PARTIAL;  Surgeon: Norton Blizzard, MD;  Location: [facility] OR;  Service: Thoracic;  Laterality: N/A;  . Total hip arthroplasty  06/30/2012    Procedure: TOTAL HIP ARTHROPLASTY ANTERIOR APPROACH;  Surgeon: Kathryne Hitch, MD;  Location: [facility] ORS;  Service: Orthopedics;  Laterality: Left;  Left total hip arthroplasty    Family History  Problem Relation Age of Onset  . Cancer Maternal Grandmother     gynecologic  . Cancer Maternal Aunt     gynecologic  . Breast cancer Paternal Aunt   . Breast cancer Paternal Aunt   . Breast cancer Paternal Aunt   . Anesthesia problems Neg Hx   . Hypotension Neg Hx   . Malignant hyperthermia Neg Hx   . Pseudochol deficiency Neg Hx     History   Social History  . Marital Status: Widowed    Spouse Name: N/A    Number of Children: 5  . Years of Education: N/A   Occupational History  . Not on file.   Social History Main  Topics  . Smoking status: Former Smoker    Quit date: 12/07/1991  . Smokeless tobacco: Never Used  . Alcohol Use: No  . Drug Use: No  . Sexual Activity: Not on file   Other Topics Concern  . 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 Former Smoker  . Quit date: 12/07/1991  Smokeless tobacco  . Never Used    History  Alcohol Use No     Allergies  Allergen Reactions  . Nulecit [Na Ferric Gluc Cplx In Sucrose]     Cramping, diarrhea nausea and drop in BP  . Adhesive [Tape] Itching  . Clarithromycin Itching  . Darvon Itching  . Epinephrine Itching  . Morphine And Related Itching  . Oxycodone Itching  . Oxycodone Hcl Itching  . Sulfa Antibiotics Itching  . Vicodin [Hydrocodone-Acetaminophen] Itching    Can take with Benadryl    Current Outpatient Prescriptions  Medication Sig Dispense Refill  . acetaminophen (TYLENOL) 500 MG tablet Take 1,000 mg by mouth every 6 (six) hours as needed. PAIN      . albuterol (PROVENTIL HFA;VENTOLIN HFA) 108 (90 BASE) MCG/ACT inhaler Inhale 2 puffs into the lungs every 6 (six) hours as needed for wheezing (wheezing).      Marland Kitchen amLODipine (NORVASC) 5 MG tablet Take 5 mg by mouth 2 (two) times daily.      Marland Kitchen atenolol (TENORMIN) 50 MG tablet Take 50 mg by mouth 2 (two) times daily.      Marland Kitchen CALCIUM-VITAMIN D-VITAMIN K PO Take 1 tablet by mouth 2 (two) times daily.      Marland Kitchen esomeprazole (NEXIUM) 40 MG capsule Take 20 mg by mouth daily before breakfast.       . lisinopril (PRINIVIL,ZESTRIL) 40 MG tablet Take 40 mg by mouth every morning.       Marland Kitchen LORazepam (ATIVAN) 0.5 MG tablet Takes as needed for anxiety      . OVER THE COUNTER MEDICATION Take 1 tablet by mouth 2 (two) times daily. [facility]      . predniSONE (DELTASONE) 1 MG tablet Take by mouth. Takes 2 tablets daily      . raloxifene (EVISTA) 60 MG tablet Take 60 mg  by mouth every morning.       Marland Kitchen  raloxifene (EVISTA) 60 MG tablet daily.      Marland Kitchen tiotropium (SPIRIVA) 18 MCG inhalation capsule Place 18 mcg into inhaler and inhale every morning.       Marland Kitchen tiZANidine (ZANAFLEX) 4 MG tablet Take 2 mg by mouth every 6 (six) hours as needed. TAKES 1/2 TABLET-MUSCLE STIFFNESS      . traMADol (ULTRAM) 50 MG tablet Take 50 mg by mouth every 12 (twelve) hours as needed for pain.        No current facility-administered medications for this visit.       Review of Systems:     Cardiac Review of Systems: Y or N  Chest Pain [ n   ]  Resting SOB [ n  ] Exertional SOB  [ y ]  Pollyann Kennedy Milo.Brash  ]   Pedal Edema [n  ]    Palpitations [n  ] Syncope  Milo.Brash  ]   Presyncope [ n  ]  General Review of Systems: [Y] = yes [  ]=no Constitional: recent weight change [ y ]; anorexia [ y ]; fatigue [ y ]; nausea [  ]; night sweats [  ]; fever [n  ]; or chills [ n ];                                                                                                                                          Dental: poor dentition[n  ];  Eye : blurred vision [  ]; diplopia [   ]; vision changes [  ];  Amaurosis fugax[  ]; Resp: cough Cove.Etienne  ];  wheezing[  ];  hemoptysis[  ]; shortness of breath[  ]; paroxysmal nocturnal dyspnea[  ]; dyspnea on exertion[  ]; or orthopnea[  ];  GI:  gallstones[  ], vomiting[  ];  dysphagia[  ]; melena[  ];  hematochezia [  ]; heartburn[  ];   Hx of  Colonoscopy[  ]; GU: kidney stones [  ]; hematuria[  ];   dysuria [  ];  nocturia[  ];  history of     obstruction [  ];             Skin: rash, swelling[  ];, hair loss[  ];  peripheral edema[  ];  or itching[  ]; Musculosketetal: myalgias[  ];  joint swelling[  ];  joint erythema[  ];  joint pain[  ];  back pain[ new and severe  ];  Heme/Lymph: bruising[  ];  bleeding[  ];  anemia[  ];  Neuro: TIA[  ];  headaches[  ];  stroke[  ];  vertigo[  ];  seizures[  ];   paresthesias[  ];  difficulty walking[ walking now  ];  Psych:depression[  ]; anxiety[  ];  Endocrine: diabetes[  ];  thyroid dysfunction[  ];  Immunizations: Flu [  ?]; Pneumococcal[ ? ];  Other:  Physical Exam: BP 140/75  Pulse 69  Resp 20  Ht 5\' 2"  (1.575 m)  Wt 142 lb (64.411 kg)  BMI 25.97 kg/m2  SpO2 93%  General appearance: alert, cooperative, appears older than stated age, cachectic, fatigued and mild distress Neurologic: intact Heart: regular rate and rhythm, S1, S2 normal, no murmur, click, rub or gallop and normal apical impulse Lungs: clear to auscultation bilaterally and normal percussion bilaterally Abdomen: Patient now has a large incisional abdominal hernia that she notes is uncomfortable and has been progressing in size since last seen by Dr. Edwyna Shell Extremities: extremities normal, atraumatic, no cyanosis or edema and Homans sign is negative, no sign of DVT Wound: Patient's left neck incision is well-healed I do not appreciate any cervical or supraclavicular adenopathy or axillary adenopathy. As noted her abdominal incision has a large ventral abdominal hernia   Diagnostic Studies & Laboratory data:     Recent Radiology Findings:  Ct Chest W Contrast  12/12/2012  *RADIOLOGY REPORT*  Clinical Data:  Esophageal cancer post esophageal surgery, chemotherapy and radiation therapy, follow-up, back pain, shortness of breath, upper abdominal pain  CT CHEST AND ABDOMEN WITH CONTRAST  Technique:  Multidetector CT imaging of the chest and abdomen was performed following the standard protocol during bolus administration of intravenous contrast.  Sagittal and coronal MPR images reconstructed from axial data set.  Contrast: OMNIPAQUE IOHEXOL 300 MG/ML  SOLN Dilute oral contrast.  Comparison:  CT chest 05/17/2012, noncontrast CTchest and abdomen from PET CT 11/05/2011  CT CHEST  Findings: Prior esophageal resection and gastric pull-up. Atherosclerotic changes greatest at descending thoracic aorta. Scattered coronary arterial  calcifications noted. Minimal pericardial effusion. No thoracic adenopathy. Small amount of pleural fluid at the right lung base. Emphysematous changes greatest upper lobes. Scattered peripheral subpleural interstitial changes in the lungs bilaterally, greater on the right, little changed.  Tiny bilateral nonspecific pulmonary nodules image 28 unchanged. No dominant pulmonary mass, pulmonary infiltrate, or pneumothorax. Bones appear diffusely demineralized with a new compression deformity of a lower thoracic vertebra, approximate T9, with 50% anterior height loss. Chronic compression deformity of the T6 vertebral body appears unchanged. Scattered degenerative disc disease changes lower thoracic and lumbar spine.  IMPRESSION: Post esophageal resection and gastric pull-up. No evidence of metastatic or recurrent disease. Osseous demineralization with new thoracic compression fracture at T9 and demonstrating approximately 50% anterior height loss. Tiny stable bilateral pulmonary nodules.  CT ABDOMEN  Findings: Lax anterior abdominal wall fascia versus prior ventral hernia repair. Nonobstructing calculus 4 mm diameter mid right kidney.  Liver, spleen, pancreas, kidneys, and adrenal glands normal appearance. Scattered atherosclerotic calcifications. Intra-abdominal portion of stomach and visualized bowel loops normal appearance. Scattered cutaneous calcifications at the posterolateral pelvis bilaterally. No intra abdominal mass, adenopathy, free fluid, or inflammatory process. Scattered degenerative disc disease changes thoracolumbar spine with mild scoliosis.  IMPRESSION: No acute intra abdominal or intrapelvic abnormalities. Question lax anterior abdominal wall fascia versus prior ventral hernia repair. Nonobstructing 4 mm right renal calculus.   Original Report Authenticated By: Ulyses Southward, M.D.     i Recent Lab Findings: Lab Results  Component Value Date   WBC 17.1* 03/16/2013   HGB 14.1 03/16/2013   HCT 44.3  03/16/2013   PLT 295 03/16/2013   GLUCOSE 188* 03/16/2013   ALT 6 03/16/2013   AST 21 03/16/2013   NA 137 03/16/2013   K 4.6 03/16/2013   CL 107 03/16/2013   CREATININE 1.09 03/16/2013   BUN 14 03/16/2013   CO2  20 03/16/2013   TSH 0.935 01/19/2012   INR 1.02 06/23/2012      Assessment / Plan:   1 .thoracic compression fracture at T9 and demonstrating approximately 50% anterior height loss have been treated with improvement  2   there is no evidence of metastatic disease .   I plan to see the patient back prn for followup from her esophageal surgery, she continues to be followed in [facility] Delight Ovens MD  Beeper [phone] Office [phone] 09/20/2013 4:22 PM

## 2014-01-07 ENCOUNTER — Telehealth: Payer: Self-pay | Admitting: Oncology

## 2014-01-07 NOTE — Telephone Encounter (Signed)
Gave pt appt for for 02/05/14 moved from pm to am per MD's PAL

## 2014-02-04 ENCOUNTER — Telehealth: Payer: Self-pay | Admitting: *Deleted

## 2014-02-04 NOTE — Telephone Encounter (Signed)
Pt lm for me to cancel her appt for 02/05/14 due to she's sick with a cold...td

## 2014-02-05 ENCOUNTER — Telehealth: Payer: Self-pay | Admitting: *Deleted

## 2014-02-05 ENCOUNTER — Ambulatory Visit: Payer: Medicare Other | Admitting: Oncology

## 2014-02-05 NOTE — Telephone Encounter (Signed)
Call from pt's son requesting office visit to be rescheduled. Canceled today's appt due to cold symptoms. Order sent to schedulers.

## 2014-02-06 ENCOUNTER — Telehealth: Payer: Self-pay | Admitting: Oncology

## 2014-02-06 NOTE — Telephone Encounter (Signed)
s.w. pt and advised on March appt....pt ok and aware °

## 2014-02-08 ENCOUNTER — Other Ambulatory Visit: Payer: Self-pay | Admitting: *Deleted

## 2014-02-08 DIAGNOSIS — R0609 Other forms of dyspnea: Principal | ICD-10-CM

## 2014-02-11 ENCOUNTER — Ambulatory Visit (HOSPITAL_COMMUNITY)
Admission: RE | Admit: 2014-02-11 | Discharge: 2014-02-11 | Disposition: A | Discharge status: Home / Self Care | Payer: Medicare Other | Source: Ambulatory Visit | Attending: Cardiovascular Disease | Admitting: Cardiovascular Disease

## 2014-02-11 DIAGNOSIS — R0609 Other forms of dyspnea: Secondary | ICD-10-CM | POA: Insufficient documentation

## 2014-02-11 DIAGNOSIS — R0989 Other specified symptoms and signs involving the circulatory and respiratory systems: Principal | ICD-10-CM | POA: Insufficient documentation

## 2014-02-11 DIAGNOSIS — I359 Nonrheumatic aortic valve disorder, unspecified: Secondary | ICD-10-CM

## 2014-02-11 NOTE — Progress Notes (Signed)
  Echocardiogram 2D Echocardiogram has been performed.  [place], [place] 02/11/2014, 11:28 AM

## 2014-02-19 ENCOUNTER — Telehealth: Payer: Self-pay | Admitting: Oncology

## 2014-02-19 ENCOUNTER — Ambulatory Visit (HOSPITAL_BASED_OUTPATIENT_CLINIC_OR_DEPARTMENT_OTHER): Payer: Medicare Other | Admitting: Oncology

## 2014-02-19 VITALS — BP 153/78 | HR 80 | Temp 97.7°F | Resp 20 | Ht 62.0 in | Wt 138.7 lb

## 2014-02-19 DIAGNOSIS — D649 Anemia, unspecified: Secondary | ICD-10-CM

## 2014-02-19 DIAGNOSIS — IMO0001 Reserved for inherently not codable concepts without codable children: Secondary | ICD-10-CM

## 2014-02-19 DIAGNOSIS — J4489 Other specified chronic obstructive pulmonary disease: Secondary | ICD-10-CM

## 2014-02-19 DIAGNOSIS — R1310 Dysphagia, unspecified: Secondary | ICD-10-CM

## 2014-02-19 DIAGNOSIS — J449 Chronic obstructive pulmonary disease, unspecified: Secondary | ICD-10-CM

## 2014-02-19 DIAGNOSIS — I10 Essential (primary) hypertension: Secondary | ICD-10-CM

## 2014-02-19 DIAGNOSIS — C160 Malignant neoplasm of cardia: Secondary | ICD-10-CM

## 2014-02-19 DIAGNOSIS — M549 Dorsalgia, unspecified: Secondary | ICD-10-CM

## 2014-02-19 NOTE — Telephone Encounter (Signed)
Gave pt appt for lab and MD for September 2015

## 2014-02-19 NOTE — Progress Notes (Signed)
[name]    OFFICE PROGRESS NOTE   INTERVAL HISTORY:   Returns for scheduled followup of esophagus cancer. She reports stable solid dysphagia since completing esophagus surgery 2 years ago. Good appetite. She reports having an upper respiratory infection with a cough and dyspnea for the past 2 months. She has been evaluated by her primary physician. This evaluation included a chest x-ray.  She recently developed increased pain at the upper back after coughing. This has persisted. She feels she has "spasms ".  Objective:  Vital signs in last 24 hours:  Blood pressure 153/78, pulse 80, temperature 97.7 F (36.5 C), temperature source Oral, resp. rate 20, height 5\' 2"  (1.575 m), weight 138 lb 11.2 oz (62.914 kg), SpO2 97.00%.    HEENT: Neck without mass Lymphatics: No cervical, supraclavicular, or axillary nodes Resp: Distant breath sounds, lungs clear bilaterally Cardio: Regular rate and rhythm, 2/6 systolic murmur GI: No hepatomegaly, ventral hernia Vascular: Trace pretibial edema on the right greater than left  Skin: Calcific subcutaneous changes at the upper thigh bilaterally     Lab Results:  Lab Results  Component Value Date   WBC 17.1* 03/16/2013   HGB 14.1 03/16/2013   HCT 44.3 03/16/2013   MCV 92.3 03/16/2013   PLT 295 03/16/2013   NEUTROABS 14.0* 03/16/2013      Medications: I have reviewed the patient's current medications.  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 Staging CT scan showed no evidence for distant metastatic disease. Staging PET scan 08/20/2011 revealed hypermetabolic activity at the distal esophagus and no evidence for metastatic disease. She completed radiation 08/26/2011 through 10/04/2011 and weekly Taxol/carboplatin chemotherapy 08/31/2011 through 10/04/2011. -restaging PET scan 11/05/2011  confirmed a decrease in the FDG activity at the distal esophagus with no evidence for metastatic disease. Status post esophagogastrectomy 12/20/2011 with pathology showing invasive adenocarcinoma, moderately differentiated, scattered foci, the largest spanning 0.26 cm; adenocarcinoma involved the muscularis mucosa; the surgical resection margins were negative; no evidence of carcinoma in 9 of 9 lymph nodes. Restaging CT scans 12/12/2012 showed no evidence of recurrent or metastatic disease.  #2 Solid/liquid dysphagia, subxiphoid discomfort, and weight loss secondary to esophagus cancer. There was initial improvement in the dysphagia. She had recurrent dysphagia. Esophagram on 09/18/2012 revealed no mass or obstruction. Poor motility was noted in the gastric pull-through. She has persistent solid dysphagia. #3 Subserosal gastrointestinal stromal tumor, low-grade, spanning 0.8 cm noted on pathology 12/20/2011.  #4 Status post left total hip replacement 06/30/2012.  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 Resolved.  #6 Chronic anemia with a history of chronic gastrointestinal blood loss. The hemoglobin was in normal range on 03/16/2013 #7 Osteoarthritis.  #8 "Pernicious" anemia.  #9 Fibromyalgia.  #10 Chronic "calcific" cutaneous lesions.  #11 Fever and erythema with associated tenderness involving one of the calcific cutaneous lesions at the left anterior lateral leg on 09/21/2011. Improved following Keflex therapy.  #12 Multiple medication allergies.  #13 Hypertension.  #14 Superficial phlebitis right arm on 10/18/2011.  #15 Abdominal wall/incisional hernia.  #16 Compression fracture at T8 and T9, likely osteoporotic, status post a T8 vertebroplasty 01/10/2013. T9 vertebroplasty 01/29/2013  #17 status post a referral to the urology Center for evaluation of flank pain  #18 "reaction "to IV iron in March and April of 2014.  #19 Anemia. She has received IV iron in the past. Most recently she  had "allergic reactions" to IV iron in March and April  of this year. She reports seeing [name] for this in the past. #20 COPD-she appears to have a flare of COPD. I recommended she followup with her primary physician if the cough and dyspnea do not improve.  Disposition:  [name] appears to be in clinical remission from esophagus cancer. I doubt the cough and dyspnea are related to the esophagus cancer. She will followup with [name] if her symptoms do not improve. The back pain could be related to new or progressive osteoporotic compression fractures. She will seek medical attention if the pain does not improve.  [name] will return for an office visit in 6 months. I recommended she see [name] to evaluate the dysphagia.   Yolanda Coder, [name]  02/19/2014  12:24 PM

## 2014-03-07 ENCOUNTER — Other Ambulatory Visit: Payer: Self-pay | Admitting: Family Medicine

## 2014-03-07 DIAGNOSIS — IMO0002 Reserved for concepts with insufficient information to code with codable children: Secondary | ICD-10-CM

## 2014-03-12 ENCOUNTER — Ambulatory Visit
Admission: RE | Admit: 2014-03-12 | Discharge: 2014-03-12 | Disposition: A | Discharge status: Home / Self Care | Payer: Medicare Other | Source: Ambulatory Visit | Attending: Family Medicine | Admitting: Family Medicine

## 2014-03-12 DIAGNOSIS — IMO0002 Reserved for concepts with insufficient information to code with codable children: Secondary | ICD-10-CM

## 2014-03-13 ENCOUNTER — Ambulatory Visit
Admission: RE | Admit: 2014-03-13 | Discharge: 2014-03-13 | Disposition: A | Discharge status: Home / Self Care | Payer: Medicare Other | Source: Ambulatory Visit | Attending: Family Medicine | Admitting: Family Medicine

## 2014-03-13 ENCOUNTER — Other Ambulatory Visit: Payer: Self-pay | Admitting: Family Medicine

## 2014-03-13 DIAGNOSIS — IMO0002 Reserved for concepts with insufficient information to code with codable children: Secondary | ICD-10-CM

## 2014-03-14 ENCOUNTER — Other Ambulatory Visit: Payer: Medicare Other

## 2014-03-15 ENCOUNTER — Ambulatory Visit
Admission: RE | Admit: 2014-03-15 | Discharge: 2014-03-15 | Disposition: A | Discharge status: Home / Self Care | Payer: Medicare Other | Source: Ambulatory Visit | Attending: Family Medicine | Admitting: Family Medicine

## 2014-03-15 ENCOUNTER — Other Ambulatory Visit: Payer: Medicare Other

## 2014-03-15 VITALS — BP 144/80 | HR 66 | Temp 97.6°F | Resp 16

## 2014-03-15 DIAGNOSIS — S22000A Wedge compression fracture of unspecified thoracic vertebra, initial encounter for closed fracture: Secondary | ICD-10-CM

## 2014-03-15 DIAGNOSIS — IMO0002 Reserved for concepts with insufficient information to code with codable children: Secondary | ICD-10-CM

## 2014-03-15 MED ORDER — FENTANYL CITRATE 0.05 MG/ML IJ SOLN
25.0000 ug | INTRAMUSCULAR | Status: DC | PRN
  Administered 2014-03-15 (×3): 25 ug via INTRAVENOUS

## 2014-03-15 MED ORDER — KETOROLAC TROMETHAMINE 30 MG/ML IJ SOLN
30.0000 mg | Freq: Once | INTRAMUSCULAR | Status: AC
Start: 2014-03-15 — End: 2014-03-15
  Administered 2014-03-15: 30 mg via INTRAVENOUS

## 2014-03-15 MED ORDER — CEFAZOLIN SODIUM 1-5 GM-% IV SOLN
1.0000 g | Freq: Once | INTRAVENOUS | Status: AC
  Administered 2014-03-15: 1 g via INTRAVENOUS

## 2014-03-15 MED ORDER — SODIUM CHLORIDE 0.9 % IV SOLN
Freq: Once | INTRAVENOUS | Status: AC
  Administered 2014-03-15: 08:00:00 via INTRAVENOUS

## 2014-03-15 MED ORDER — DIPHENHYDRAMINE HCL 50 MG/ML IJ SOLN
25.0000 mg | Freq: Once | INTRAMUSCULAR | Status: AC
  Administered 2014-03-15: 25 mg via INTRAVENOUS

## 2014-03-15 MED ORDER — MIDAZOLAM HCL 2 MG/2ML IJ SOLN
1.0000 mg | INTRAMUSCULAR | Status: DC | PRN
  Administered 2014-03-15 (×2): 0.5 mg via INTRAVENOUS

## 2014-03-15 NOTE — Discharge Instructions (Signed)
Vertebroplasty Post Procedure Discharge Instructions  1. May resume a regular diet and any medications that you routinely take (including pain medications). 2. No driving day of procedure. 3. Upon discharge go home and rest for at least 4 hours.  May use an ice pack as needed to injection sites on back. 4. Change dressing after shower in the morning and replace with bandaides, change daily until healed. 5. Follow up with your primary physician IF:BPPH therapy.    Please contact our office at [phone] for the following symptoms:   Fever greater than 100 degrees  Increased swelling, pain, or redness at injection site.   Thank you for visiting [facility].

## 2014-03-15 NOTE — Progress Notes (Signed)
Pt had a significant amount of pain post procedure. Dr. Jola Baptist aware and pt took own pain meds before discharge. Post procedure standing were done and procedure area looked good per Dr. Jola Baptist. He spoke with pt before discharge.

## 2014-04-25 ENCOUNTER — Inpatient Hospital Stay (HOSPITAL_COMMUNITY)
Admission: EM | Admit: 2014-04-25 | Discharge: 2014-05-02 | DRG: 871 | Disposition: A | Discharge status: Home Health | Payer: Medicare Other | Attending: Internal Medicine | Admitting: Internal Medicine

## 2014-04-25 ENCOUNTER — Emergency Department (HOSPITAL_COMMUNITY): Payer: Medicare Other

## 2014-04-25 ENCOUNTER — Encounter (HOSPITAL_COMMUNITY): Payer: Self-pay | Admitting: Emergency Medicine

## 2014-04-25 DIAGNOSIS — K274 Chronic or unspecified peptic ulcer, site unspecified, with hemorrhage: Secondary | ICD-10-CM

## 2014-04-25 DIAGNOSIS — S22000A Wedge compression fracture of unspecified thoracic vertebra, initial encounter for closed fracture: Secondary | ICD-10-CM

## 2014-04-25 DIAGNOSIS — J9600 Acute respiratory failure, unspecified whether with hypoxia or hypercapnia: Secondary | ICD-10-CM | POA: Diagnosis present

## 2014-04-25 DIAGNOSIS — E236 Other disorders of pituitary gland: Secondary | ICD-10-CM | POA: Diagnosis present

## 2014-04-25 DIAGNOSIS — I10 Essential (primary) hypertension: Secondary | ICD-10-CM

## 2014-04-25 DIAGNOSIS — F411 Generalized anxiety disorder: Secondary | ICD-10-CM | POA: Diagnosis present

## 2014-04-25 DIAGNOSIS — F329 Major depressive disorder, single episode, unspecified: Secondary | ICD-10-CM | POA: Diagnosis present

## 2014-04-25 DIAGNOSIS — Z96649 Presence of unspecified artificial hip joint: Secondary | ICD-10-CM

## 2014-04-25 DIAGNOSIS — C160 Malignant neoplasm of cardia: Secondary | ICD-10-CM

## 2014-04-25 DIAGNOSIS — Z85028 Personal history of other malignant neoplasm of stomach: Secondary | ICD-10-CM

## 2014-04-25 DIAGNOSIS — I498 Other specified cardiac arrhythmias: Secondary | ICD-10-CM | POA: Diagnosis present

## 2014-04-25 DIAGNOSIS — M79609 Pain in unspecified limb: Secondary | ICD-10-CM

## 2014-04-25 DIAGNOSIS — A419 Sepsis, unspecified organism: Principal | ICD-10-CM

## 2014-04-25 DIAGNOSIS — Z01810 Encounter for preprocedural cardiovascular examination: Secondary | ICD-10-CM

## 2014-04-25 DIAGNOSIS — IMO0002 Reserved for concepts with insufficient information to code with codable children: Secondary | ICD-10-CM

## 2014-04-25 DIAGNOSIS — K284 Chronic or unspecified gastrojejunal ulcer with hemorrhage: Secondary | ICD-10-CM

## 2014-04-25 DIAGNOSIS — R1310 Dysphagia, unspecified: Secondary | ICD-10-CM

## 2014-04-25 DIAGNOSIS — R197 Diarrhea, unspecified: Secondary | ICD-10-CM

## 2014-04-25 DIAGNOSIS — M1990 Unspecified osteoarthritis, unspecified site: Secondary | ICD-10-CM

## 2014-04-25 DIAGNOSIS — Z9289 Personal history of other medical treatment: Secondary | ICD-10-CM

## 2014-04-25 DIAGNOSIS — L989 Disorder of the skin and subcutaneous tissue, unspecified: Secondary | ICD-10-CM

## 2014-04-25 DIAGNOSIS — J449 Chronic obstructive pulmonary disease, unspecified: Secondary | ICD-10-CM

## 2014-04-25 DIAGNOSIS — Z923 Personal history of irradiation: Secondary | ICD-10-CM

## 2014-04-25 DIAGNOSIS — R339 Retention of urine, unspecified: Secondary | ICD-10-CM | POA: Diagnosis present

## 2014-04-25 DIAGNOSIS — M1610 Unilateral primary osteoarthritis, unspecified hip: Secondary | ICD-10-CM | POA: Diagnosis present

## 2014-04-25 DIAGNOSIS — J9601 Acute respiratory failure with hypoxia: Secondary | ICD-10-CM

## 2014-04-25 DIAGNOSIS — F458 Other somatoform disorders: Secondary | ICD-10-CM

## 2014-04-25 DIAGNOSIS — R1013 Epigastric pain: Secondary | ICD-10-CM

## 2014-04-25 DIAGNOSIS — IMO0001 Reserved for inherently not codable concepts without codable children: Secondary | ICD-10-CM | POA: Diagnosis present

## 2014-04-25 DIAGNOSIS — M810 Age-related osteoporosis without current pathological fracture: Secondary | ICD-10-CM | POA: Diagnosis present

## 2014-04-25 DIAGNOSIS — I4430 Unspecified atrioventricular block: Secondary | ICD-10-CM

## 2014-04-25 DIAGNOSIS — R110 Nausea: Secondary | ICD-10-CM

## 2014-04-25 DIAGNOSIS — J690 Pneumonitis due to inhalation of food and vomit: Secondary | ICD-10-CM

## 2014-04-25 DIAGNOSIS — M4120 Other idiopathic scoliosis, site unspecified: Secondary | ICD-10-CM | POA: Diagnosis present

## 2014-04-25 DIAGNOSIS — B379 Candidiasis, unspecified: Secondary | ICD-10-CM | POA: Diagnosis present

## 2014-04-25 DIAGNOSIS — Z860100 Personal history of colon polyps, unspecified: Secondary | ICD-10-CM

## 2014-04-25 DIAGNOSIS — J189 Pneumonia, unspecified organism: Secondary | ICD-10-CM

## 2014-04-25 DIAGNOSIS — R0989 Other specified symptoms and signs involving the circulatory and respiratory systems: Secondary | ICD-10-CM

## 2014-04-25 DIAGNOSIS — D510 Vitamin B12 deficiency anemia due to intrinsic factor deficiency: Secondary | ICD-10-CM

## 2014-04-25 DIAGNOSIS — R55 Syncope and collapse: Secondary | ICD-10-CM

## 2014-04-25 DIAGNOSIS — Z87891 Personal history of nicotine dependence: Secondary | ICD-10-CM

## 2014-04-25 DIAGNOSIS — J852 Abscess of lung without pneumonia: Secondary | ICD-10-CM | POA: Diagnosis present

## 2014-04-25 DIAGNOSIS — R09A2 Foreign body sensation, throat: Secondary | ICD-10-CM

## 2014-04-25 DIAGNOSIS — M8448XA Pathological fracture, other site, initial encounter for fracture: Secondary | ICD-10-CM | POA: Diagnosis present

## 2014-04-25 DIAGNOSIS — R51 Headache: Secondary | ICD-10-CM | POA: Diagnosis present

## 2014-04-25 DIAGNOSIS — F3289 Other specified depressive episodes: Secondary | ICD-10-CM | POA: Diagnosis present

## 2014-04-25 DIAGNOSIS — M169 Osteoarthritis of hip, unspecified: Secondary | ICD-10-CM

## 2014-04-25 DIAGNOSIS — D72829 Elevated white blood cell count, unspecified: Secondary | ICD-10-CM

## 2014-04-25 DIAGNOSIS — M797 Fibromyalgia: Secondary | ICD-10-CM

## 2014-04-25 LAB — CBC WITH DIFFERENTIAL/PLATELET
BASOS ABS: 0 10*3/uL (ref 0.0–0.1)
BASOS PCT: 0 % (ref 0–1)
EOS PCT: 0 % (ref 0–5)
Eosinophils Absolute: 0 10*3/uL (ref 0.0–0.7)
HCT: 37.3 % (ref 36.0–46.0)
Hemoglobin: 11.8 g/dL — ABNORMAL LOW (ref 12.0–15.0)
LYMPHS PCT: 3 % — AB (ref 12–46)
Lymphs Abs: 0.4 10*3/uL — ABNORMAL LOW (ref 0.7–4.0)
MCH: 28.9 pg (ref 26.0–34.0)
MCHC: 31.6 g/dL (ref 30.0–36.0)
MCV: 91.2 fL (ref 78.0–100.0)
Monocytes Absolute: 1 10*3/uL (ref 0.1–1.0)
Monocytes Relative: 7 % (ref 3–12)
NEUTROS ABS: 14.5 10*3/uL — AB (ref 1.7–7.7)
Neutrophils Relative %: 90 % — ABNORMAL HIGH (ref 43–77)
PLATELETS: 328 10*3/uL (ref 150–400)
RBC: 4.09 MIL/uL (ref 3.87–5.11)
RDW: 15.8 % — AB (ref 11.5–15.5)
WBC: 16 10*3/uL — AB (ref 4.0–10.5)

## 2014-04-25 LAB — URINALYSIS, ROUTINE W REFLEX MICROSCOPIC
BILIRUBIN URINE: NEGATIVE
GLUCOSE, UA: NEGATIVE mg/dL
HGB URINE DIPSTICK: NEGATIVE
Ketones, ur: NEGATIVE mg/dL
Leukocytes, UA: NEGATIVE
Nitrite: NEGATIVE
PH: 5 (ref 5.0–8.0)
Protein, ur: NEGATIVE mg/dL
SPECIFIC GRAVITY, URINE: 1.012 (ref 1.005–1.030)
Urobilinogen, UA: 0.2 mg/dL (ref 0.0–1.0)

## 2014-04-25 LAB — I-STAT CG4 LACTIC ACID, ED: LACTIC ACID, VENOUS: 1.56 mmol/L (ref 0.5–2.2)

## 2014-04-25 LAB — COMPREHENSIVE METABOLIC PANEL
ALBUMIN: 3.6 g/dL (ref 3.5–5.2)
AST: 16 U/L (ref 0–37)
Alkaline Phosphatase: 72 U/L (ref 39–117)
BUN: 17 mg/dL (ref 6–23)
CALCIUM: 9.6 mg/dL (ref 8.4–10.5)
CO2: 22 meq/L (ref 19–32)
Chloride: 102 mEq/L (ref 96–112)
Creatinine, Ser: 1.13 mg/dL — ABNORMAL HIGH (ref 0.50–1.10)
GFR calc Af Amer: 53 mL/min — ABNORMAL LOW (ref >90)
GFR, EST NON AFRICAN AMERICAN: 46 mL/min — AB (ref >90)
Glucose, Bld: 110 mg/dL — ABNORMAL HIGH (ref 70–99)
Potassium: 3.7 mEq/L (ref 3.7–5.3)
SODIUM: 140 meq/L (ref 137–147)
Total Bilirubin: 0.3 mg/dL (ref 0.3–1.2)
Total Protein: 7.4 g/dL (ref 6.0–8.3)

## 2014-04-25 LAB — I-STAT TROPONIN, ED: Troponin i, poc: 0 ng/mL (ref 0.00–0.08)

## 2014-04-25 MED ORDER — ENOXAPARIN SODIUM 40 MG/0.4ML ~~LOC~~ SOLN
40.0000 mg | SUBCUTANEOUS | Status: DC
  Administered 2014-04-25 – 2014-05-01 (×7): 40 mg via SUBCUTANEOUS
  Filled 2014-04-25 (×8): qty 0.4

## 2014-04-25 MED ORDER — PANTOPRAZOLE SODIUM 40 MG PO TBEC
40.0000 mg | DELAYED_RELEASE_TABLET | Freq: Two times a day (BID) | ORAL | Status: DC
  Administered 2014-04-26 – 2014-05-02 (×13): 40 mg via ORAL
  Filled 2014-04-25 (×16): qty 1

## 2014-04-25 MED ORDER — ASPIRIN EC 81 MG PO TBEC
81.0000 mg | DELAYED_RELEASE_TABLET | Freq: Every day | ORAL | Status: DC
  Administered 2014-04-26 – 2014-04-27 (×2): 81 mg via ORAL
  Filled 2014-04-25 (×3): qty 1

## 2014-04-25 MED ORDER — AMLODIPINE BESYLATE 2.5 MG PO TABS
2.5000 mg | ORAL_TABLET | Freq: Every day | ORAL | Status: DC
  Filled 2014-04-25: qty 1

## 2014-04-25 MED ORDER — VANCOMYCIN HCL IN DEXTROSE 1-5 GM/200ML-% IV SOLN
1000.0000 mg | Freq: Once | INTRAVENOUS | Status: DC
  Administered 2014-04-25: 1000 mg via INTRAVENOUS
  Filled 2014-04-25: qty 200

## 2014-04-25 MED ORDER — VITAMIN D (ERGOCALCIFEROL) 1.25 MG (50000 UNIT) PO CAPS
50000.0000 [IU] | ORAL_CAPSULE | ORAL | Status: DC
  Administered 2014-04-25: 50000 [IU] via ORAL
  Filled 2014-04-25: qty 1

## 2014-04-25 MED ORDER — BIOTENE DRY MOUTH MT LIQD
15.0000 mL | Freq: Two times a day (BID) | OROMUCOSAL | Status: DC
  Administered 2014-04-25 – 2014-05-02 (×11): 15 mL via OROMUCOSAL

## 2014-04-25 MED ORDER — LORATADINE 10 MG PO TABS
10.0000 mg | ORAL_TABLET | Freq: Every day | ORAL | Status: DC
  Administered 2014-04-25 – 2014-05-02 (×8): 10 mg via ORAL
  Filled 2014-04-25 (×8): qty 1

## 2014-04-25 MED ORDER — ALUM & MAG HYDROXIDE-SIMETH 200-200-20 MG/5ML PO SUSP: 15.0000 mL | ORAL | Status: DC | PRN

## 2014-04-25 MED ORDER — LORAZEPAM 0.5 MG PO TABS
0.5000 mg | ORAL_TABLET | Freq: Three times a day (TID) | ORAL | Status: DC | PRN
  Administered 2014-04-27: 0.5 mg via ORAL
  Filled 2014-04-25: qty 1

## 2014-04-25 MED ORDER — PREDNISONE 5 MG PO TABS
6.0000 mg | ORAL_TABLET | Freq: Every day | ORAL | Status: DC
  Administered 2014-04-26 – 2014-04-27 (×2): 6 mg via ORAL
  Filled 2014-04-25 (×4): qty 1

## 2014-04-25 MED ORDER — TIZANIDINE HCL 2 MG PO TABS
2.0000 mg | ORAL_TABLET | Freq: Three times a day (TID) | ORAL | Status: DC | PRN
  Administered 2014-05-02: 2 mg via ORAL
  Filled 2014-04-25: qty 1

## 2014-04-25 MED ORDER — FENTANYL CITRATE 0.05 MG/ML IJ SOLN
50.0000 ug | Freq: Once | INTRAMUSCULAR | Status: AC
  Administered 2014-04-25: 50 ug via INTRAVENOUS
  Filled 2014-04-25: qty 2

## 2014-04-25 MED ORDER — LEVOFLOXACIN IN D5W 750 MG/150ML IV SOLN
750.0000 mg | Freq: Once | INTRAVENOUS | Status: DC
  Filled 2014-04-25: qty 150

## 2014-04-25 MED ORDER — DIMENHYDRINATE 50 MG PO TABS
25.0000 mg | ORAL_TABLET | Freq: Once | ORAL | Status: AC
  Administered 2014-04-25: 25 mg via ORAL
  Filled 2014-04-25: qty 1

## 2014-04-25 MED ORDER — ALBUTEROL SULFATE (2.5 MG/3ML) 0.083% IN NEBU
2.5000 mg | INHALATION_SOLUTION | Freq: Four times a day (QID) | RESPIRATORY_TRACT | Status: DC
  Administered 2014-04-25 – 2014-04-26 (×5): 2.5 mg via RESPIRATORY_TRACT
  Filled 2014-04-25 (×4): qty 3

## 2014-04-25 MED ORDER — DIMENHYDRINATE 50 MG PO TABS
25.0000 mg | ORAL_TABLET | Freq: Four times a day (QID) | ORAL | Status: DC | PRN
  Administered 2014-04-25 – 2014-04-28 (×3): 25 mg via ORAL
  Filled 2014-04-25 (×4): qty 1

## 2014-04-25 MED ORDER — RALOXIFENE HCL 60 MG PO TABS
60.0000 mg | ORAL_TABLET | Freq: Every morning | ORAL | Status: DC
  Administered 2014-04-26 – 2014-05-02 (×7): 60 mg via ORAL
  Filled 2014-04-25 (×7): qty 1

## 2014-04-25 MED ORDER — HYDROCORTISONE-ACETIC ACID 1-2 % OT SOLN
1.0000 [drp] | Freq: Three times a day (TID) | OTIC | Status: DC | PRN
  Filled 2014-04-25: qty 10

## 2014-04-25 MED ORDER — LISINOPRIL 40 MG PO TABS
40.0000 mg | ORAL_TABLET | Freq: Every morning | ORAL | Status: DC
  Filled 2014-04-25: qty 1

## 2014-04-25 MED ORDER — ACETAMINOPHEN 325 MG PO TABS: 650.0000 mg | ORAL_TABLET | Freq: Four times a day (QID) | ORAL | Status: DC | PRN

## 2014-04-25 MED ORDER — ARFORMOTEROL TARTRATE 15 MCG/2ML IN NEBU
15.0000 ug | INHALATION_SOLUTION | Freq: Two times a day (BID) | RESPIRATORY_TRACT | Status: DC
  Administered 2014-04-25 – 2014-05-02 (×14): 15 ug via RESPIRATORY_TRACT
  Filled 2014-04-25 (×19): qty 2

## 2014-04-25 MED ORDER — CLINDAMYCIN PHOSPHATE 600 MG/50ML IV SOLN
600.0000 mg | Freq: Once | INTRAVENOUS | Status: DC
  Administered 2014-04-25: 600 mg via INTRAVENOUS
  Filled 2014-04-25: qty 50

## 2014-04-25 MED ORDER — ATENOLOL 50 MG PO TABS
50.0000 mg | ORAL_TABLET | Freq: Two times a day (BID) | ORAL | Status: DC
  Administered 2014-04-25 – 2014-04-26 (×2): 50 mg via ORAL
  Filled 2014-04-25 (×3): qty 1

## 2014-04-25 MED ORDER — KCL IN DEXTROSE-NACL 20-5-0.45 MEQ/L-%-% IV SOLN
INTRAVENOUS | Status: DC
  Administered 2014-04-25 – 2014-04-29 (×5): via INTRAVENOUS
  Filled 2014-04-25 (×7): qty 1000

## 2014-04-25 MED ORDER — LEVOFLOXACIN IN D5W 750 MG/150ML IV SOLN: 750.0000 mg | INTRAVENOUS | Status: DC

## 2014-04-25 MED ORDER — FENTANYL CITRATE 0.05 MG/ML IJ SOLN
50.0000 ug | INTRAMUSCULAR | Status: DC | PRN
  Administered 2014-04-25 – 2014-04-28 (×13): 50 ug via INTRAVENOUS
  Filled 2014-04-25 (×14): qty 2

## 2014-04-25 MED ORDER — BUDESONIDE 0.25 MG/2ML IN SUSP
0.2500 mg | Freq: Two times a day (BID) | RESPIRATORY_TRACT | Status: DC
  Administered 2014-04-25 – 2014-05-02 (×14): 0.25 mg via RESPIRATORY_TRACT
  Filled 2014-04-25 (×27): qty 2

## 2014-04-25 MED ORDER — TRAMADOL HCL 50 MG PO TABS
100.0000 mg | ORAL_TABLET | Freq: Two times a day (BID) | ORAL | Status: DC
  Administered 2014-04-25 – 2014-04-27 (×5): 100 mg via ORAL
  Filled 2014-04-25 (×5): qty 2

## 2014-04-25 MED ORDER — IOHEXOL 350 MG/ML SOLN
100.0000 mL | Freq: Once | INTRAVENOUS | Status: AC | PRN
  Administered 2014-04-25: 100 mL via INTRAVENOUS

## 2014-04-25 MED ORDER — PIPERACILLIN-TAZOBACTAM 3.375 G IVPB
3.3750 g | Freq: Three times a day (TID) | INTRAVENOUS | Status: DC
  Administered 2014-04-25 – 2014-05-02 (×21): 3.375 g via INTRAVENOUS
  Filled 2014-04-25 (×24): qty 50

## 2014-04-25 MED ORDER — ACETAMINOPHEN 500 MG PO TABS
1000.0000 mg | ORAL_TABLET | Freq: Four times a day (QID) | ORAL | Status: DC | PRN
  Administered 2014-04-27: 1000 mg via ORAL
  Filled 2014-04-25: qty 2

## 2014-04-25 NOTE — ED Notes (Signed)
Patient reports she is still having nausea also.  Patient reports her pain and nausea are chronic and the only thing that helps her nausea is dramamine, and she takes toradol for pain which helps but does not relieve pain completely.

## 2014-04-25 NOTE — Progress Notes (Addendum)
ANTIBIOTIC CONSULT NOTE - INITIAL  Pharmacy Consult for: Zosyn  Indication: Aspiration PNA/Pulmonary abscess   Allergies  Allergen Reactions  . Keflex [Cephalexin]     "severe itching"  . Nulecit [Na Ferric Gluc Cplx In Sucrose] Diarrhea, Nausea Only and Other (See Comments)      drop in BP  . Spiriva Handihaler [Tiotropium Bromide Monohydrate]     "made me stop going to the bathroom"  . Adhesive [Tape] Itching  . Clarithromycin Itching  . Darvon Itching  . Epinephrine Itching  . Morphine And Related Itching  . Oxycodone Itching  . Sulfa Antibiotics Itching  . Vicodin [Hydrocodone-Acetaminophen] Itching    Can take with Benadryl    Patient Measurements:     Vital Signs: Temp: 100.5 F (38.1 C) (05/21 1529) Temp src: Oral (05/21 1529) BP: 144/79 mmHg (05/21 1529) Pulse Rate: 99 (05/21 1529) Intake/Output from previous day:   Intake/Output from this shift:    Labs:  Recent Labs  04/25/14 1405  WBC 16.0*  HGB 11.8*  PLT 328  CREATININE 1.13*   The CrCl is unknown because both a height and weight (above a minimum accepted value) are required for this calculation. No results found for this basename: VANCOTROUGH, VANCOPEAK, VANCORANDOM, GENTTROUGH, GENTPEAK, GENTRANDOM, TOBRATROUGH, TOBRAPEAK, TOBRARND, AMIKACINPEAK, AMIKACINTROU, AMIKACIN,  in the last 72 hours   Microbiology: No results found for this or any previous visit (from the past 720 hour(s)).  Medical History: Past Medical History  Diagnosis Date  . Pernicious anemia   . Osteoarthritis   . Fibromyalgia   . Skin lesion     chronic calcific cutaneous lesions  . HX of multiple bleeding ulcers 09/01/2011  . Hypertension     takes Atenolol daily  . Emphysema   . Bronchitis     hx  . Adenocarcinoma of gastroesophageal junction   . Dizziness     d/t crystals in ears that "roll around"and can't get them out  . Back pain   . Osteoporosis   . Scoliosis   . Psoriasis   . H/O hiatal hernia   . GERD  (gastroesophageal reflux disease)     takes Aciphex daily  . Gastric ulcer   . Constipation     since Chemo and Radiation;finished up in Nov 2012  . Diverticulosis   . History of colonic polyps   . Shingles     broke out 2wks ago;has been on meds and to finish up tomorrow  . Kidney stone 25+yrs ago  . Pernicious anemia   . Vitamin B deficiency   . Complication of anesthesia     shortness of breath and pain with gas   . COPD (chronic obstructive pulmonary disease)   . Shortness of breath     with exertion   . Blood transfusion     Medications:  Scheduled:   Infusions:  . clindamycin (CLEOCIN) IV 600 mg (04/25/14 1629)  . levofloxacin (LEVAQUIN) IV    . vancomycin     PRN:  Assessment:  80 y.o. year-old female with history of esophageal cancer diagnosed in 2012 treated with radiation, chemotherapy, and esophagogastrectomy currently in remission, chronic dysphasia to solids and liquids, chronic anemia who presents with weakness, CP, SOB.  Starting broad spectrum abx for aspiration PNA/concern for pulmonary abscess and CAP  Zosyn to cover aspiration PNA and Levaquin for atypical coverage  5/21 >> Zosyn >>    WBC: Elevated 16  Temp: Low grade 100.5 Renal: Scr slightly elevated 1.13 - Estimated CrCl 41  ml/min   5/21 Urine: Sent 5/21 Blood x 2: Sent   Goal of Therapy:  Zosyn and levaquin per renal function   Plan:  1.) Start Zosyn 3.375 grams IV Q8h, infuse over 4 hours - to cover aspiration PNA and pulmonary abscess  2.) Monitor renal function, CBC, f/u cultures   Gaye Alken Elyanah Farino PharmD Pager #: [phone] 5:21 PM 04/25/2014

## 2014-04-25 NOTE — ED Provider Notes (Signed)
  Face-to-face evaluation   History: She presents for evaluation of shortness of breath started today. She's here by EMS during transport. She denies vomiting. He has had dysphagia and occasionally notes, while eating. She denies abdominal pain, weakness, or dizziness  Physical exam: Elderly, frail. Lungs scattered rhonchi with fair air movement. Abdomen is soft and nontender.  Assessment: Hypoxia related to likely aspiration pneumonia. She will need to be admitted for further treatment and stabilization.  Medical screening examination/treatment/procedure(s) were conducted as a shared visit with non-physician practitioner(s) and myself.  I personally evaluated the patient during the encounter     Date: 04/25/14  Rate: 100  Rhythm: sinus tachycardia  QRS Axis: normal  PR and QT Intervals: normal  ST/T Wave abnormalities: normal  PR and QRS Conduction Disutrbances:first-degree A-V block   Narrative Interpretation:   Old EKG Reviewed: unchanged- 03/16/13   Richarda Blade, MD 04/26/14 1442

## 2014-04-25 NOTE — ED Notes (Signed)
Patient initially wheezing and EMS gave one breathing treatment of Albuterol 5mg  and .5 of Atrovent and 125mg  of Solumedrol.  Lung sounds are clear now and patient also received 4mg  Zofran for nausea, that has subsided as well.  Patient has been recently treated with chemo for esophageal cancer.  Patient has history of COPD and asthma.

## 2014-04-25 NOTE — ED Provider Notes (Signed)
CSN: [id number]     Arrival date & time 04/25/14  1303 History   First MD Initiated Contact with Patient 04/25/14 1342     Chief Complaint  Patient presents with  . Weakness     (Consider location/radiation/quality/duration/timing/severity/associated sxs/prior Treatment) HPI Comments: Patient is a 80 year old female with history of pernicious anemia, hypertension, adenocarcinoma of the gastroesophageal junction treated with radiation and chemotherapy, COPD who presents today with generalized weakness and shortness of breath. She states that she has not felt well for many years, but has recently worsened over the past few weeks. Yesterday she developed severe shortness of breath which worsened today. EMS found her with audible wheezing and gave her a duo neb treatment. She feels as though this did improve her symptoms. She still feels generally weak with a sharp chest pain. She has a chronic cough, but does not feel like this is significantly worse than normal. She denies fever, chills. She has chronic nausea. This is not the worse than normal.  Patient is a 79 y.o. female presenting with weakness. The history is provided by the patient. No language interpreter was used.  Weakness Associated symptoms include chest pain, coughing, nausea and weakness. Pertinent negatives include no abdominal pain, chills or fever.    Past Medical History  Diagnosis Date  . Pernicious anemia   . Osteoarthritis   . Fibromyalgia   . Skin lesion     chronic calcific cutaneous lesions  . HX of multiple bleeding ulcers 09/01/2011  . Hypertension     takes Atenolol daily  . Emphysema   . Bronchitis     hx  . Adenocarcinoma of gastroesophageal junction   . Dizziness     d/t crystals in ears that "roll around"and can't get them out  . Back pain   . Osteoporosis   . Scoliosis   . Psoriasis   . H/O hiatal hernia   . GERD (gastroesophageal reflux disease)     takes Aciphex daily  . Gastric ulcer   .  Constipation     since Chemo and Radiation;finished up in Nov 2012  . Diverticulosis   . History of colonic polyps   . Shingles     broke out 2wks ago;has been on meds and to finish up tomorrow  . Kidney stone 25+yrs ago  . Pernicious anemia   . Vitamin B deficiency   . Complication of anesthesia     shortness of breath and pain with gas   . COPD (chronic obstructive pulmonary disease)   . Shortness of breath     with exertion   . Blood transfusion    Past Surgical History  Procedure Laterality Date  . Right hip replacement  2001  . Tonsillectomy      as a child  . Tubal ligation  1975  . Appendectomy  1975  . Cataract surgery  10+yrs ago  . Esophagogastroduodenoscopy    . Colonoscopy    . Extubation  12/20/2011       . Partial esophagectomy  12/20/2011    Procedure: ESOPHAGECTOMY PARTIAL;  Surgeon: Pierre Bali, MD;  Location: [facility];  Service: Thoracic;  Laterality: N/A;  . Total hip arthroplasty  06/30/2012    Procedure: TOTAL HIP ARTHROPLASTY ANTERIOR APPROACH;  Surgeon: Mcarthur Rossetti, MD;  Location: [facility];  Service: Orthopedics;  Laterality: Left;  Left total hip arthroplasty   Family History  Problem Relation Age of Onset  . Cancer Maternal Grandmother     gynecologic  .  Cancer Maternal Aunt     gynecologic  . Breast cancer Paternal Aunt   . Breast cancer Paternal Aunt   . Breast cancer Paternal Aunt   . Anesthesia problems Neg Hx   . Hypotension Neg Hx   . Malignant hyperthermia Neg Hx   . Pseudochol deficiency Neg Hx    History  Substance Use Topics  . Smoking status: Former Smoker    Quit date: 12/07/1991  . Smokeless tobacco: Never Used  . Alcohol Use: No   OB History   Grav Para Term Preterm Abortions TAB SAB Ect Mult Living                 Review of Systems  Constitutional: Negative for fever and chills.  Respiratory: Positive for cough and shortness of breath.   Cardiovascular: Positive for chest pain. Negative for leg swelling.   Gastrointestinal: Positive for nausea. Negative for abdominal pain.  Neurological: Positive for weakness.  All other systems reviewed and are negative.     Allergies  Keflex; Nulecit; Spiriva handihaler; Adhesive; Clarithromycin; Darvon; Epinephrine; Morphine and related; Oxycodone; Oxycodone hcl; Sulfa antibiotics; and Vicodin  Home Medications   Prior to Admission medications   Medication Sig Start Date End Date Taking? Authorizing Provider  acetaminophen (TYLENOL) 500 MG tablet Take 1,000 mg by mouth every 6 (six) hours as needed. PAIN    Historical Provider, MD  albuterol (PROVENTIL HFA;VENTOLIN HFA) 108 (90 BASE) MCG/ACT inhaler Inhale 2 puffs into the lungs every 6 (six) hours as needed for wheezing (wheezing).    Historical Provider, MD  amLODipine (NORVASC) 5 MG tablet Take 5 mg by mouth 2 (two) times daily.    Historical Provider, MD  atenolol (TENORMIN) 50 MG tablet Take 50 mg by mouth 2 (two) times daily.    Historical Provider, MD  budesonide-formoterol (SYMBICORT) 160-4.5 MCG/ACT inhaler Inhale 2 puffs into the lungs 2 (two) times daily.    Historical Provider, MD  CALCIUM-VITAMIN D-VITAMIN K PO Take 1 tablet by mouth 2 (two) times daily.    Historical Provider, MD  esomeprazole (NEXIUM) 40 MG capsule Take 20 mg by mouth daily before breakfast.     Historical Provider, MD  furosemide (LASIX) 20 MG tablet daily.  02/07/14   Historical Provider, MD  lisinopril (PRINIVIL,ZESTRIL) 40 MG tablet Take 40 mg by mouth every morning.     Historical Provider, MD  LORazepam (ATIVAN) 0.5 MG tablet Takes as needed for anxiety 01/03/13   Historical Provider, MD  OVER THE COUNTER MEDICATION Take 1 tablet by mouth 2 (two) times daily. [place]    Historical Provider, MD  predniSONE (DELTASONE) 1 MG tablet Take by mouth. Takes 2 tablets daily 03/02/13   Historical Provider, MD  raloxifene (EVISTA) 60 MG tablet Take 60 mg by mouth every morning.     Historical Provider, MD  tiZANidine  (ZANAFLEX) 4 MG tablet Take 2 mg by mouth every 6 (six) hours as needed. TAKES 1/2 TABLET-MUSCLE STIFFNESS    Historical Provider, MD  traMADol (ULTRAM) 50 MG tablet Take 50 mg by mouth 3 (three) times daily. Pt states she takes 2 tablets TID 02/19/14    Historical Provider, MD   BP 93/49  Pulse 93  Temp(Src) 98.2 F (36.8 C) (Oral)  Resp 20  Ht 5\' 4"  (1.626 m)  Wt 139 lb 15.9 oz (63.5 kg)  BMI 24.02 kg/m2  SpO2 97% Physical Exam  Nursing note and vitals reviewed. Constitutional: She is oriented to person, place, and time.  She appears well-developed and well-nourished. She appears ill. No distress.  frail  HENT:  Head: Normocephalic and atraumatic.  Right Ear: External ear normal.  Left Ear: External ear normal.  Nose: Nose normal.  Mouth/Throat: Oropharynx is clear and moist.  Eyes: Conjunctivae are normal.  Neck: Normal range of motion.  Cardiovascular: Normal rate, regular rhythm and normal heart sounds.   Pulmonary/Chest: Effort normal. No stridor. No respiratory distress. She has wheezes. She has rales.  Abdominal: Soft. She exhibits no distension. There is tenderness in the epigastric area.  Musculoskeletal: Normal range of motion.  Neurological: She is alert and oriented to person, place, and time. She has normal strength.  Skin: Skin is warm and dry. She is not diaphoretic. No erythema.  Psychiatric: She has a normal mood and affect. Her behavior is normal.    ED Course  Procedures (including critical care time) Labs Review Labs Reviewed  CBC WITH DIFFERENTIAL - Abnormal; Notable for the following:    WBC 16.0 (*)    Hemoglobin 11.8 (*)    RDW 15.8 (*)    Neutrophils Relative % 90 (*)    Neutro Abs 14.5 (*)    Lymphocytes Relative 3 (*)    Lymphs Abs 0.4 (*)    All other components within normal limits  COMPREHENSIVE METABOLIC PANEL - Abnormal; Notable for the following:    Glucose, Bld 110 (*)    Creatinine, Ser 1.13 (*)    GFR calc non Af Amer 46 (*)    GFR  calc Af Amer 53 (*)    All other components within normal limits  BASIC METABOLIC PANEL - Abnormal; Notable for the following:    Sodium 132 (*)    Glucose, Bld 135 (*)    Creatinine, Ser 1.16 (*)    GFR calc non Af Amer 44 (*)    GFR calc Af Amer 51 (*)    All other components within normal limits  CBC - Abnormal; Notable for the following:    WBC 17.0 (*)    RBC 3.79 (*)    Hemoglobin 10.8 (*)    HCT 34.4 (*)    RDW 16.2 (*)    All other components within normal limits  CULTURE, BLOOD (ROUTINE X 2)  CULTURE, BLOOD (ROUTINE X 2)  CULTURE, EXPECTORATED SPUTUM-ASSESSMENT  CULTURE, RESPIRATORY (NON-EXPECTORATED)  URINE CULTURE  GRAM STAIN  URINALYSIS, ROUTINE W REFLEX MICROSCOPIC  HIV ANTIBODY (ROUTINE TESTING)  LEGIONELLA ANTIGEN, URINE  STREP PNEUMONIAE URINARY ANTIGEN  I-STAT TROPOININ, ED  I-STAT CG4 LACTIC ACID, ED    Imaging Review Ct Angio Chest Pe W/cm &/or Wo Cm  04/25/2014   EXAM: CT ANGIOGRAPHY CHEST WITH CONTRAST  TECHNIQUE: Multidetector CT imaging of the chest was performed using the standard protocol during bolus administration of intravenous contrast. Multiplanar CT image reconstructions and MIPs were obtained to evaluate the vascular anatomy.  CONTRAST:  169mL OMNIPAQUE IOHEXOL 350 MG/ML SOLN  COMPARISON:  12/12/2012.  FINDINGS: No evidence for pulmonary emboli. Atheromatous change transverse arch. Sequelae of gastric pull-through, with dilated fluid-filled gastric pull up.  Atherosclerotic changes greatest at the descending thoracic cord are. Coronary artery calcifications with cardiomegaly. Slight pericardial effusion.  Moderate bilateral pleural effusions. COPD. Tiny bilateral pulmonary nodules) image 39, 41) noted previously not calcified.  Extensive left lower lobe consolidation involving the superior segment and posterior basal segment could represent aspiration. Air-fluid level as seen on image 29 series 7 could represent pulmonary abscess versus layering fluid  within an air cyst. Minor  infiltrates or atelectatic changes right base.  Chronic compression deformity T5. Treated vertebral body compression fractures with vertebral augmentation at T6, T8, and T9. No worrisome osseous lesions.  Upper abdominal organs unremarkable.  Review of the MIP images confirms the above findings.  IMPRESSION: Extensive left lower lobe consolidation involving superior segment and posterior basal segments could represent aspiration pneumonia in this patient with a gastric pull-through and a dilated fluid-filled gastric pull up.  Air-fluid level could represent pulmonary abscess versus layering fluid within air cyst.  Bilateral pleural effusions.  No evidence for pulmonary emboli.   Electronically Signed   By: Rolla Flatten M.D.   On: 04/25/2014 15:42   Dg Esophagus  04/26/2014   CLINICAL DATA:  80 year old female with history of esophageal cancer, partial esophagectomy and gastric pull-through. Also treated with radiation. Admitted with pneumonia, difficulty swallowing, coughing, and choking. Initial encounter.  EXAM: ESOPHOGRAM/BARIUM SWALLOW  TECHNIQUE: Single contrast examination was performed using  barium.  FLUOROSCOPY TIME:  1 min 38 seconds.  COMPARISON:  Esophagram 12/27/2011. Chest and abdomen CT 12/12/2012.  FINDINGS: Due to limited mobility, the study was performed with the fluoroscopy table inclined at 45 degrees. A single contrast study was performed.  The patient tolerated PO barium well and without difficulty.  The patient did cough after some swallows of barium, but no definite aspiration was identified.  Sequelae of partial esophagectomy and gastric pull-through are noted. No obstruction to the Ford flow of barium into the gastric pull-through. Barium was slow to empty from the intra thoracic stomach into the abdomen, but at the conclusion of the study about half of the ingested barium had reached the duodenum C loop.  No anastomotic stricture identified.  No extravasation.   Numerous surgical clips in the mediastinum. Postprocedural abdominal film demonstrating extensive flank soft tissue calcifications, and previous bilateral hip arthroplasty.  IMPRESSION: 1. Satisfactory swallow study in this patient who is status post subtotal esophagectomy and gastric pull-through. 2. Some coughing was noted with swallowing during this study, raising the possibility of aspiration, but no definite aspiration was identified. A followup modified barium swallow would be more sensitive.   Electronically Signed   By: Lars Pinks M.D.   On: 04/26/2014 11:51   Dg Chest Portable 1 View  04/25/2014   CLINICAL DATA:  Weakness and shortness of breath.  COPD.  EXAM: PORTABLE CHEST - 1 VIEW  COMPARISON:  02/07/2014.  FINDINGS: Cardiomegaly persists. Calcified tortuous aorta. Mild vascular congestion. Slight left pleural effusion. Osteopenia.  IMPRESSION: Cardiomegaly with mild vascular congestion. Small left effusion. Worsening aeration from priors.   Electronically Signed   By: Rolla Flatten M.D.   On: 04/25/2014 14:17     EKG Interpretation None      MDM   Final diagnoses:  COPD (chronic obstructive pulmonary disease)  Dysphagia  Globus sensation  Hypertension    Patient presents to ED for evaluation of shortness of breath and generalized weakness. A CT angio of her chest was done to rule out PE. CT chest shows no pulmonary embolus. It shows possible aspiration pneumonia. CT also shows air fluid level which could represent pulmonary abscess vs layering fluid within air cyst. Patient started on antibiotics in ED. Patient admitted to medicine. Admission is appreciated. Hemodynamically stable at this time. Dr. Eulis Foster evaluated patient and agrees with plan. Patient / Family / Caregiver informed of clinical course, understand medical decision-making process, and agree with plan.     Elwyn Lade, PA-C 04/26/14 1357

## 2014-04-25 NOTE — Progress Notes (Signed)
MD ok with having DG esophagus done tomorrow.

## 2014-04-25 NOTE — Progress Notes (Signed)
*  PRELIMINARY RESULTS* Vascular Ultrasound Right lower extremity venous duplex has been completed.  Preliminary findings: no evidence of DVT.  Landry Mellow, RDMS, RVT  04/25/2014, 3:28 PM

## 2014-04-25 NOTE — Progress Notes (Signed)
Called to get report.  ED nurse to cal me back.

## 2014-04-25 NOTE — H&P (Addendum)
[facility] History and Physical  [name] EXB:[id number] DOB: 04/04/1934 DOA: 04/25/2014  Referring physician:  Cleatrice Burke PCP:  [name], MD   Chief Complaint:  Weakness, epigastric pain, SOB  HPI:  The patient is a 80 y.o. year-old female with history of adenocarcinoma of the GE junction diagnosed in 2012 treated with radiation, chemotherapy, and esophagogastrectomy currently in remission, chronic dysphagia to solids and liquids, COPD, chronic anemia who presents with weakness, HTN.  She states that she has not felt well since her cancer treatments a few years ago, but she has felt more fatigued over last few weeks.  She has chronic cough and SOB attributed to her COPD.  Yesterday, she was cleaning the house when she suddenly became more SOB and wheezy.  Last night, she developed some palpitations.  She has chronic nausea and epigastric pain 7-8/10 that occasionally possibly radiates to her back.  She has sensation of food and liquid getting stuck and sitting in her chest for long periods of time which is worse recently she thinks.  She presented to the ER today via EMS because of severe SOB.    In ER, she was 86% on 2L and up to mid-90s on 3L.  Febrile to 100.73F with HR in the 90s.  WBC 16K.  CT angio chest demonstrated extensive left lower lobe consolidation and possible lung abscess.  She was started on clindamycin by the ER for aspiration pneumonia.    Review of Systems:  General:  Denies fevers, chills, weight loss or gain, but has fatigue  HEENT:  Denies changes to hearing and vision, rhinorrhea, sinus congestion, sore throat CV:  Denies chest pain and palpitations, lower extremity edema.  PULM:  + SOB, wheezing, cough.   GI:  Denies nausea and heaves, denies constipation, diarrhea.   GU:  Denies dysuria, frequency, urgency ENDO:  Denies polyuria, polydipsia.   HEME:  Denies hematemesis, blood in stools, melena, abnormal bruising or bleeding.  LYMPH:  Denies  lymphadenopathy.   MSK:  Chronic arthralgias, myalgias.   DERM:  Denies skin rash or ulcer.   NEURO:  Denies focal numbness, weakness, slurred speech, confusion, facial droop.  PSYCH:  Chronic anxiety and depression.    Past Medical History  Diagnosis Date  . Pernicious anemia   . Osteoarthritis   . Fibromyalgia   . Skin lesion     chronic calcific cutaneous lesions  . HX of multiple bleeding ulcers 09/01/2011  . Hypertension     takes Atenolol daily  . Emphysema   . Bronchitis     hx  . Adenocarcinoma of gastroesophageal junction   . Dizziness     d/t crystals in ears that "roll around"and can't get them out  . Back pain   . Osteoporosis   . Scoliosis   . Psoriasis   . H/O hiatal hernia   . GERD (gastroesophageal reflux disease)     takes Aciphex daily  . Gastric ulcer   . Constipation     since Chemo and Radiation;finished up in Nov 2012  . Diverticulosis   . History of colonic polyps   . Shingles     broke out 2wks ago;has been on meds and to finish up tomorrow  . Kidney stone 25+yrs ago  . Pernicious anemia   . Vitamin B deficiency   . Complication of anesthesia     shortness of breath and pain with gas   . COPD (chronic obstructive pulmonary disease)   . Shortness of  breath     with exertion   . Blood transfusion    Past Surgical History  Procedure Laterality Date  . Right hip replacement  2001  . Tonsillectomy      as a child  . Tubal ligation  1975  . Appendectomy  1975  . Cataract surgery  10+yrs ago  . Esophagogastroduodenoscopy    . Colonoscopy    . Extubation  12/20/2011       . Partial esophagectomy  12/20/2011    Procedure: ESOPHAGECTOMY PARTIAL;  Surgeon: Pierre Bali, MD;  Location: [facility];  Service: Thoracic;  Laterality: N/A;  . Total hip arthroplasty  06/30/2012    Procedure: TOTAL HIP ARTHROPLASTY ANTERIOR APPROACH;  Surgeon: Mcarthur Rossetti, MD;  Location: [facility];  Service: Orthopedics;  Laterality: Left;  Left total hip  arthroplasty   Social History:  reports that she quit smoking about 22 years ago. Her smoking use included Cigarettes. She smoked 3.00 packs per day. She has never used smokeless tobacco. She reports that she does not drink alcohol or use illicit drugs. 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 Keflex [Cephalexin]     "severe itching"  . Nulecit [Na Ferric Gluc Cplx In Sucrose] Diarrhea, Nausea Only and Other (See Comments)      drop in BP  . Spiriva Handihaler [Tiotropium Bromide Monohydrate]     "made me stop going to the bathroom"  . Adhesive [Tape] Itching  . Clarithromycin Itching  . Darvon Itching  . Epinephrine Itching  . Morphine And Related Itching  . Oxycodone Itching  . Sulfa Antibiotics Itching  . Vicodin [Hydrocodone-Acetaminophen] Itching    Can take with Benadryl    Family History  Problem Relation Age of Onset  . Cancer Maternal Grandmother     gynecologic  . Cancer Maternal Aunt     gynecologic  . Breast cancer Paternal Aunt   . Breast cancer Paternal Aunt   . Breast cancer Paternal Aunt   . Anesthesia problems Neg Hx   . Hypotension Neg Hx   . Malignant hyperthermia Neg Hx   . Pseudochol deficiency Neg Hx      Prior to Admission medications   Medication Sig Start Date End Date Taking? Authorizing Provider  acetaminophen (TYLENOL) 500 MG tablet Take 1,000 mg by mouth every 6 (six) hours as needed. PAIN   Yes Historical Provider, MD  acetic acid-hydrocortisone (VOSOL-HC) otic solution Place 1 drop into both ears 3 (three) times daily as needed (pain and itching).  04/17/14  Yes Historical Provider, MD  albuterol (PROVENTIL HFA;VENTOLIN HFA) 108 (90 BASE) MCG/ACT inhaler Inhale 2 puffs into the lungs every 6 (six) hours as needed for wheezing (wheezing).   Yes Historical Provider, MD  amLODipine (NORVASC) 2.5 MG tablet Take 2.5 mg by mouth daily.   Yes Historical  Provider, MD  amoxicillin (AMOXIL) 500 MG capsule Take 2,000 mg by mouth See admin instructions. Takes 4 capsules 1 hour prior to appointment   Yes Historical Provider, MD  aspirin EC 81 MG tablet Take 81 mg by mouth daily.   Yes Historical Provider, MD  atenolol (TENORMIN) 50 MG tablet Take 50 mg by mouth 2 (two) times daily.   Yes Historical Provider, MD  budesonide-formoterol (SYMBICORT) 160-4.5 MCG/ACT inhaler Inhale 2 puffs into the lungs 2 (two) times daily.   Yes Historical Provider, MD  CALCIUM-VITAMIN D-VITAMIN K PO Take 1  tablet by mouth 2 (two) times daily.   Yes Historical Provider, MD  Cholecalciferol (VITAMIN D3) 50000 UNITS CAPS Take 1 capsule by mouth every 21 ( twenty-one) days.   Yes Historical Provider, MD  esomeprazole (NEXIUM) 20 MG capsule Take 20 mg by mouth daily at 12 noon.   Yes Historical Provider, MD  furosemide (LASIX) 20 MG tablet Take 20 mg by mouth daily as needed for fluid.  02/07/14  Yes Historical Provider, MD  hydroxypropyl methylcellulose (ISOPTO TEARS) 2.5 % ophthalmic solution Place 1 drop into both eyes 3 (three) times daily as needed for dry eyes.   Yes Historical Provider, MD  lisinopril (PRINIVIL,ZESTRIL) 40 MG tablet Take 40 mg by mouth every morning.    Yes Historical Provider, MD  loratadine (CLARITIN) 10 MG tablet Take 10 mg by mouth daily.   Yes Historical Provider, MD  LORazepam (ATIVAN) 0.5 MG tablet Take 0.5 mg by mouth every 8 (eight) hours as needed. Takes as needed for anxiety 01/03/13  Yes Historical Provider, MD  predniSONE (DELTASONE) 1 MG tablet Take 2 mg by mouth daily with breakfast. Takes 2 tablets daily 03/02/13  Yes Historical Provider, MD  raloxifene (EVISTA) 60 MG tablet Take 60 mg by mouth every morning.    Yes Historical Provider, MD  [facility] JOHNS WORT PO Take 1 capsule by mouth 2 (two) times daily.   Yes Historical Provider, MD  tiZANidine (ZANAFLEX) 4 MG tablet Take 2-4 mg by mouth every 8 (eight) hours as needed for muscle spasms. TAKES  1/2 TABLET-MUSCLE STIFFNESS   Yes Historical Provider, MD  traMADol (ULTRAM) 50 MG tablet Take 100 mg by mouth 2 (two) times daily.    Yes Historical Provider, MD   Physical Exam: Filed Vitals:   04/25/14 1530 04/25/14 1630 04/25/14 1700 04/25/14 1730  BP: 156/76 149/90 160/81 162/80  Pulse: 99 97 94 94  Temp:      TempSrc:      Resp: 19 18 19 24   SpO2: 94% 99% 97% 92%     General:  Thin CF, NAD, mild tachypnea on 3L [place]  Eyes:  PERRL, anicteric, non-injected.  ENT:  Nares clear.  OP clear, non-erythematous without plaques or exudates.  MMM.  Neck:  Supple without TM or JVD.    Lymph:  No cervical, supraclavicular, or submandibular LAD.  Cardiovascular:  RRR, normal S1, S2, 2/6 systolic murmur RUSB, no rubs or gallops.  2+ pulses, warm extremities  Respiratory:  Diminished bilateral BS, worse in the LLL, no obvious rales or rhonchi or wheeze.  No increased WOB.  Abdomen:  NABS.  Soft, distended hernia in upper abdomen which is reducible, mild TTP in epigastrium without rebound or guarding  Skin:  No rashes or focal lesions.  Musculoskeletal:  Normal bulk and tone.  No LE edema.  Psychiatric:  A & O x 4.  Appropriate affect.  Neurologic:  CN 3-12 intact.  5-/5 strength throughout.  Sensation intact.  Labs on Admission:  Basic Metabolic Panel:  Recent Labs Lab 04/25/14 1405  NA 140  K 3.7  CL 102  CO2 22  GLUCOSE 110*  BUN 17  CREATININE 1.13*  CALCIUM 9.6   Liver Function Tests:  Recent Labs Lab 04/25/14 1405  AST 16  ALT <5  ALKPHOS 72  BILITOT 0.3  PROT 7.4  ALBUMIN 3.6   No results found for this basename: LIPASE, AMYLASE,  in the last 168 hours No results found for this basename: AMMONIA,  in the last 168  hours CBC:  Recent Labs Lab 04/25/14 1405  WBC 16.0*  NEUTROABS 14.5*  HGB 11.8*  HCT 37.3  MCV 91.2  PLT 328   Cardiac Enzymes: No results found for this basename: CKTOTAL, CKMB, CKMBINDEX, TROPONINI,  in the last 168 hours  BNP  (last 3 results) No results found for this basename: PROBNP,  in the last 8760 hours CBG: No results found for this basename: GLUCAP,  in the last 168 hours  Radiological Exams on Admission: Ct Angio Chest Pe W/cm &/or Wo Cm  04/25/2014   EXAM: CT ANGIOGRAPHY CHEST WITH CONTRAST  TECHNIQUE: Multidetector CT imaging of the chest was performed using the standard protocol during bolus administration of intravenous contrast. Multiplanar CT image reconstructions and MIPs were obtained to evaluate the vascular anatomy.  CONTRAST:  161mL OMNIPAQUE IOHEXOL 350 MG/ML SOLN  COMPARISON:  12/12/2012.  FINDINGS: No evidence for pulmonary emboli. Atheromatous change transverse arch. Sequelae of gastric pull-through, with dilated fluid-filled gastric pull up.  Atherosclerotic changes greatest at the descending thoracic cord are. Coronary artery calcifications with cardiomegaly. Slight pericardial effusion.  Moderate bilateral pleural effusions. COPD. Tiny bilateral pulmonary nodules) image 39, 41) noted previously not calcified.  Extensive left lower lobe consolidation involving the superior segment and posterior basal segment could represent aspiration. Air-fluid level as seen on image 29 series 7 could represent pulmonary abscess versus layering fluid within an air cyst. Minor infiltrates or atelectatic changes right base.  Chronic compression deformity T5. Treated vertebral body compression fractures with vertebral augmentation at T6, T8, and T9. No worrisome osseous lesions.  Upper abdominal organs unremarkable.  Review of the MIP images confirms the above findings.  IMPRESSION: Extensive left lower lobe consolidation involving superior segment and posterior basal segments could represent aspiration pneumonia in this patient with a gastric pull-through and a dilated fluid-filled gastric pull up.  Air-fluid level could represent pulmonary abscess versus layering fluid within air cyst.  Bilateral pleural effusions.  No  evidence for pulmonary emboli.   Electronically Signed   By: Rolla Flatten M.D.   On: 04/25/2014 15:42   Dg Chest Portable 1 View  04/25/2014   CLINICAL DATA:  Weakness and shortness of breath.  COPD.  EXAM: PORTABLE CHEST - 1 VIEW  COMPARISON:  02/07/2014.  FINDINGS: Cardiomegaly persists. Calcified tortuous aorta. Mild vascular congestion. Slight left pleural effusion. Osteopenia.  IMPRESSION: Cardiomegaly with mild vascular congestion. Small left effusion. Worsening aeration from priors.   Electronically Signed   By: Rolla Flatten M.D.   On: 04/25/2014 14:17    EKG:  Pending  Assessment/Plan Principal Problem:   Aspiration pneumonia Active Problems:   Pernicious anemia   Arthritis   Adenocarcinoma of gastroesophageal junction   Hypertension   Degenerative arthritis of hip   Compression fracture of thoracic vertebra   CAP (community acquired pneumonia)   Dysphagia   Odynophagia   Globus sensation   COPD (chronic obstructive pulmonary disease)   Leukocytosis  ---  Sepsis and acute hypoxic respiratory failure due to aspiration pneumonia with possible developing lung abscess -  Blood cultures pending  -  Sputum culture if able -  Legionella/s. pneumo ag -  Consider Pulmonology consult, although Dr. [name] may be able to follow up in clinic -  Zosyn -  Continuous pulse oximetry  Dysphagia and odynophagia -  Speech therapy and repeat esophagram -  Consider GI consult pending results of esophagram evaluate -  Change PPI to protonix BID (aware of increased aspiration risk but patient with  severe symptoms)  Epigastric pain, chronic -  Increase PPI as above -  Continue tramadol -  Tylenol as needed  Palpitations -  Keep electrolytes wnl -  telemetry  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 [name] two months ago -  Will add Dr. [name] to rounding list  Chronic steroid use due to arthritis.  Low dose but could still be at risk for  iatrogenic adrenal insufficiency.  BP currently stable.  -  Increase to three times normal dose x 3 day -  Stress dose if decompensates  Constipation, stable  COPD with increased SOB and diminshed breath sounds, but does not appear to have COPD exacerbation -  Try to avoid systemic steroids for now -  Start brovana and pulmicort -  Hold symbicort -  Albuterol four times daily -  Hold ipratropium because of hx of acute urinary retention.  Can start if not responding to above treatments with close monitoring of urine output.    HTN, stable, continue Norvasc, atenolol, lisinopril  Leukocytosis due to pneumonia -  Trend CBC  Pernicious anemia, defer to outpatient management.  Hgb stable.   Anxiety/depression, stable, continue ativan prn.  St. John's Wort on hold  Diet:  Regular with thin Access:  PIV IVF:  yes Proph:  lovenox  Code Status: Full Family Communication: patient alone Disposition Plan: Admit to telemetry  Time spent: 60 min [facility] Pager [phone]  If 7PM-7AM, please contact night-coverage [email or web address] Password [facility] 04/25/2014, 5:57 PM

## 2014-04-26 ENCOUNTER — Inpatient Hospital Stay (HOSPITAL_COMMUNITY): Payer: Medicare Other

## 2014-04-26 DIAGNOSIS — J690 Pneumonitis due to inhalation of food and vomit: Secondary | ICD-10-CM

## 2014-04-26 DIAGNOSIS — J9600 Acute respiratory failure, unspecified whether with hypoxia or hypercapnia: Secondary | ICD-10-CM

## 2014-04-26 LAB — BASIC METABOLIC PANEL
BUN: 19 mg/dL (ref 6–23)
CHLORIDE: 97 meq/L (ref 96–112)
CO2: 21 meq/L (ref 19–32)
CREATININE: 1.16 mg/dL — AB (ref 0.50–1.10)
Calcium: 8.9 mg/dL (ref 8.4–10.5)
GFR calc Af Amer: 51 mL/min — ABNORMAL LOW (ref >90)
GFR calc non Af Amer: 44 mL/min — ABNORMAL LOW (ref >90)
GLUCOSE: 135 mg/dL — AB (ref 70–99)
Potassium: 4.1 mEq/L (ref 3.7–5.3)
Sodium: 132 mEq/L — ABNORMAL LOW (ref 137–147)

## 2014-04-26 LAB — STREP PNEUMONIAE URINARY ANTIGEN: STREP PNEUMO URINARY ANTIGEN: POSITIVE — AB

## 2014-04-26 LAB — CBC
HEMATOCRIT: 34.4 % — AB (ref 36.0–46.0)
Hemoglobin: 10.8 g/dL — ABNORMAL LOW (ref 12.0–15.0)
MCH: 28.5 pg (ref 26.0–34.0)
MCHC: 31.4 g/dL (ref 30.0–36.0)
MCV: 90.8 fL (ref 78.0–100.0)
Platelets: 334 10*3/uL (ref 150–400)
RBC: 3.79 MIL/uL — ABNORMAL LOW (ref 3.87–5.11)
RDW: 16.2 % — ABNORMAL HIGH (ref 11.5–15.5)
WBC: 17 10*3/uL — AB (ref 4.0–10.5)

## 2014-04-26 LAB — EXPECTORATED SPUTUM ASSESSMENT W GRAM STAIN, RFLX TO RESP C

## 2014-04-26 LAB — HIV ANTIBODY (ROUTINE TESTING W REFLEX): HIV 1&2 Ab, 4th Generation: NONREACTIVE

## 2014-04-26 LAB — EXPECTORATED SPUTUM ASSESSMENT W REFEX TO RESP CULTURE

## 2014-04-26 MED ORDER — IBUPROFEN 600 MG PO TABS
600.0000 mg | ORAL_TABLET | Freq: Once | ORAL | Status: DC
  Filled 2014-04-26 (×2): qty 1

## 2014-04-26 MED ORDER — ATENOLOL 25 MG PO TABS
25.0000 mg | ORAL_TABLET | Freq: Two times a day (BID) | ORAL | Status: DC
  Administered 2014-04-26 – 2014-04-27 (×3): 25 mg via ORAL
  Filled 2014-04-26 (×5): qty 1

## 2014-04-26 MED ORDER — GUAIFENESIN-DM 100-10 MG/5ML PO SYRP
10.0000 mL | ORAL_SOLUTION | ORAL | Status: DC | PRN
  Administered 2014-04-26 – 2014-04-28 (×2): 10 mL via ORAL
  Filled 2014-04-26 (×2): qty 10

## 2014-04-26 MED ORDER — ALBUTEROL SULFATE (2.5 MG/3ML) 0.083% IN NEBU
2.5000 mg | INHALATION_SOLUTION | Freq: Three times a day (TID) | RESPIRATORY_TRACT | Status: DC
  Administered 2014-04-27 – 2014-05-01 (×11): 2.5 mg via RESPIRATORY_TRACT
  Filled 2014-04-26 (×11): qty 3

## 2014-04-26 NOTE — Care Management Note (Addendum)
    Page 1 of 2   05/02/2014     3:54:31 PM CARE MANAGEMENT NOTE 05/02/2014  Patient:  [name], [name]   Account Number:  [id number]  Date Initiated:  04/26/2014  Documentation initiated by:  Dessa Phi  Subjective/Objective Assessment:   80 Y/O F ADMITTED W/WEAKNESS,ABD PAIN,ASPIRATION PNA.XB:JYNWGNFAOZ CA.     Action/Plan:   FROM HOME W/SON.HAS PCP,PHARMACY.   Anticipated DC Date:  05/02/2014   Anticipated DC Plan:  [place]  CM consult      Choice offered to / List presented to:  C-1 Patient        [place] arranged  HH-1 RN  [place].   Status of service:  Completed, signed off Medicare Important Message given?  YES (If response is "NO", the following Medicare IM given date fields will be blank) Date Medicare IM given:  04/30/2014 Date Additional Medicare IM given:    Discharge Disposition:  [place]  Per UR Regulation:  Reviewed for med. necessity/level of care/duration of stay  If discussed at [place] of Stay Meetings, dates discussed:   05/02/2014    Comments:  05/03/14 Yolanda Giampietro RN,BSN NCM [phone] PATIENT DECLINED SNF.[facility] CHOSEN FOR HHC.TC KRISTEN REP AWARE OF D/C & HHRN/PT/OT ORDER.NO FURTHER D/C NEEDS.  05/02/14 Yolanda Wisdom RN,BSN NCM [phone] D/C SNF.  04/29/14 Yolanda Valdes RN,BSN NCM [phone] ASP PNA,C DIFF.IV ABX,NEBS.D/C PLAN SNF.  04/26/14 Yolanda Termini RN,BSN NCM [phone] [facility].

## 2014-04-26 NOTE — Evaluation (Signed)
Clinical/Bedside Swallow Evaluation Patient Details  Name: [name] MRN: [id number] Date of Birth: March 13, 1934  Today's Date: 04/26/2014 Time: 1430-1500 SLP Time Calculation (min): 30 min  Past Medical History:  Past Medical History  Diagnosis Date  . Pernicious anemia   . Osteoarthritis   . Fibromyalgia   . Skin lesion     chronic calcific cutaneous lesions  . HX of multiple bleeding ulcers 09/01/2011  . Hypertension     takes Atenolol daily  . Emphysema   . Bronchitis     hx  . Adenocarcinoma of gastroesophageal junction   . Dizziness     d/t crystals in ears that "roll around"and can't get them out  . Back pain   . Osteoporosis   . Scoliosis   . Psoriasis   . H/O hiatal hernia   . GERD (gastroesophageal reflux disease)     takes Aciphex daily  . Gastric ulcer   . Constipation     since Chemo and Radiation;finished up in Nov 2012  . Diverticulosis   . History of colonic polyps   . Shingles     broke out 2wks ago;has been on meds and to finish up tomorrow  . Kidney stone 25+yrs ago  . Pernicious anemia   . Vitamin B deficiency   . Complication of anesthesia     shortness of breath and pain with gas   . COPD (chronic obstructive pulmonary disease)   . Shortness of breath     with exertion   . Blood transfusion    Past Surgical History:  Past Surgical History  Procedure Laterality Date  . Right hip replacement  2001  . Tonsillectomy      as a child  . Tubal ligation  1975  . Appendectomy  1975  . Cataract surgery  10+yrs ago  . Esophagogastroduodenoscopy    . Colonoscopy    . Extubation  12/20/2011       . Partial esophagectomy  12/20/2011    Procedure: ESOPHAGECTOMY PARTIAL;  Surgeon: Pierre Bali, MD;  Location: [facility];  Service: Thoracic;  Laterality: N/A;  . Total hip arthroplasty  06/30/2012    Procedure: TOTAL HIP ARTHROPLASTY ANTERIOR APPROACH;  Surgeon: Mcarthur Rossetti, MD;  Location: [facility];  Service: Orthopedics;  Laterality:  Left;  Left total hip arthroplasty   HPI:  80 yo female adm to [facility] with aspiration pna.  Pt PMH + for COPD, GERD, esophageal cancer s/p chemoradiation and partial esophagectomy with gastric pull up.  Pt reports dysphagia to solids and liquids - stating both can make her choke.  Esophagram completed 5/22 showed satisfactory swallow study with slow to empthy from intrathoracic stomach into abdomen.     Assessment / Plan / Recommendation Clinical Impression  Pt with negative CN exam and oropharyngeal swallow appeared timely.  Cough noted at baseline and with intake and pt admits to frequently choking on both food/drink.  She admits if she uses caution, she does better and will not choke as bad.  Pt currently senses food stasis in pharynx and admits to worsening dysphagia with current illness. Compensation strategies including cough and "hard swallow" instructed by previous slp helpful per pt.  SLP suspects primary aspiration risk is from poor esophageal motility, large hiatal hernia and reflux.  Pt admits to sensing reflux when lying down in bed at night - advised her to consider sleeping on recliner  or asking re: obtaining hopital bed but pt politely declined.  Sleeping on wedge helps  to decrease reflux per pt's statement.  Provided aspiration precautions/tips to mitigate risk.  Suspect ongoing dysphagia/aspiration from primary esophageal deficits for which pt compensates when acutely well.  SLP to sign off as all education completed and do not anticipate MBS will change pt's outcome- pt agrees.  Please reorder if desire.     Aspiration Risk  Severe    Diet Recommendation Regular;Thin liquid (as tolerated)   Liquid Administration via: Cup;Straw Supervision: Patient able to self feed Compensations: Slow rate;Small sips/bites Postural Changes and/or Swallow Maneuvers: Seated upright 90 degrees;Upright 30-60 min after meal    Other  Recommendations Oral Care Recommendations: Oral care BID   Follow Up  Recommendations       Frequency and Duration        Pertinent Vitals/Pain Afebrile, decreased     Swallow Study Prior Functional Status   see [place] Date of Onset: 04/26/14 HPI: 80 yo female adm to [facility] with aspiration pna.  Pt PMH + for COPD, GERD, esophageal cancer s/p chemoradiation and partial esophagectomy with gastric pull up.  Pt reports dysphagia to solids and liquids - stating both can make her choke.  Esophagram completed 5/22 showed satisfactory swallow study with slow to empthy from intrathoracic stomach into abdomen.   Type of Study: Bedside swallow evaluation Previous Swallow Assessment: SLP reviewed pictures from esophagram and did not note significant pharyngeal stasis nor barium in trachea on pictures capturing said areas.   Diet Prior to this Study: Regular;Thin liquids Temperature Spikes Noted: No Respiratory Status: Room air History of Recent Intubation: No Behavior/Cognition: Alert;Cooperative;Pleasant mood Oral Cavity - Dentition: Adequate natural dentition Self-Feeding Abilities: Able to feed self Patient Positioning: Upright in bed Baseline Vocal Quality: Clear Volitional Cough: Strong Volitional Swallow: Able to elicit    Oral/Motor/Sensory Function Overall Oral Motor/Sensory Function: Appears within functional limits for tasks assessed   Ice Chips Ice chips: Not tested   Thin Liquid Thin Liquid: Impaired Presentation: Cup Pharyngeal  Phase Impairments: Cough - Delayed    Nectar Thick Nectar Thick Liquid: Not tested Other Comments: pt politely declined to attempt ensure supplement   Honey Thick Honey Thick Liquid: Not tested   Puree Puree: Within functional limits Presentation: Spoon;Self Fed   Solid   GO    Solid: Not tested      Luanna Salk, [facility] SLP [phone]

## 2014-04-26 NOTE — Progress Notes (Signed)
Dr Sheran Fava made aware urine positive for strep pneumonia. No new orders received.

## 2014-04-26 NOTE — Progress Notes (Signed)
[facility] PROGRESS NOTE  [name] TFT:[id number] DOB: 17-May-1934 DOA: 04/25/2014 PCP: [name], MD  Assessment/Plan  Sepsis and acute hypoxic respiratory failure due to aspiration pneumonia with possible developing lung abscess.  Temperature trended down, however, WBC stable to higher - Blood cultures NGTD - Sputum culture pending  - Legionella/s. pneumo ag not obtained yet - Consider Pulmonology consult, although Dr. [name] may be able to follow up in clinic  - Continue Zosyn and escalate to include vancomycin if patient decompensates -  Anticipate transitioning to augmentin for long 4-6 week course with f/u CT in 2-4 weeks to follow possible lung abscess - Continuous pulse oximetry   Dysphagia and odynophagia  - Speech therapy consult pending - Esophogram:  Liquid does pass, although slowly from intrathoracic stomach to below diaphragm - Change PPI to protonix BID (aware of increased aspiration risk but patient with severe symptoms)   Epigastric pain, chronic  - Increase PPI as above  - Continue tramadol  - Tylenol as needed   Palpitations with PVC and Kodie Kishi episodes of SVT that spontaneously resolve - Keep electrolytes wnl  - TSH -  Patient recently had ECHO done which demonstrated normal structure and function 02/11/2014 -  When BP tolerates, recommend titrating up BB as tolerated -  Cardiology f/u -  Okay to d/c telemetry  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 [name] two months ago  - Dr. [name] added to rounding list   Chronic steroid use due to arthritis. Low dose but could still be at risk for iatrogenic adrenal insufficiency. BP currently stable.  - Increase to three times normal dose, day 2 of 3 - Stress dose if decompensates   Constipation, stable   COPD with increased SOB and diminshed breath sounds, but does not appear to have COPD exacerbation  - Try to avoid systemic steroids for now  - continue  brovana and pulmicort  - Hold symbicort  - Albuterol four times daily  - Hold ipratropium because of hx of acute urinary retention. Can start if not responding to above treatments with close monitoring of urine output.   HTN, blood pressure low normal -  Hold Norvasc and lisinopril  -  Please hold parameters for BP on BB  Leukocytosis due to pneumonia  - Trend CBC   Pernicious anemia, defer to outpatient management. Hgb stable.   Anxiety/depression, stable, continue ativan prn. St. John's Wort on hold  Hyponatremia, likely due to mild SIADH from pneumonia -  Repeat BMP in AM  Diet: Regular with thin  Access: PIV  IVF: yes  Proph: lovenox   Code Status: Full  Family Communication: patient alone  Disposition Plan:  Continue inpatient   Consultants:  None  Procedures:  CT chest  Esophagram  Antibiotics:  Clindamycin 5/21 x 1  Vancomycin 5/21 >>  Zosyn 5/21 >>   HPI/Subjective:  Still feels unwell.  SOB, coughing up rusty sputum.  Persistent epigastric pain  Objective: Filed Vitals:   04/26/14 0753 04/26/14 0931 04/26/14 1155 04/26/14 1258  BP:  103/57  93/49  Pulse:  102  93  Temp:    98.2 F (36.8 C)  TempSrc:    Oral  Resp:  20  20  Height:      Weight:      SpO2: 92% 94% 95% 97%    Intake/Output Summary (Last 24 hours) at 04/26/14 1411 Last data filed at 04/26/14 1343  Gross per 24 hour  Intake    885 ml  Output      0 ml  Net    885 ml   Filed Weights   04/25/14 1814  Weight: 63.5 kg (139 lb 15.9 oz)    Exam:   General:  Thin CF, moderate resp distress with SCM retractions, tachypnea  HEENT:  NCAT, MMM  Cardiovascular:  Mild tachycardia, RR, nl S1, S2 no mrg, 2+ pulses, warm extremities  Respiratory:  Very diminished with some rales at the left base, + rhonchi, no obvious wheeze, no increased WOB  Abdomen:   NABS, soft, soft large upper abdomen hernia, mild TTP in epigastrium  MSK:   Normal tone and bulk, 1+ RLE  edema  Neuro:  Grossly intact  Data Reviewed: Basic Metabolic Panel:  Recent Labs Lab 04/25/14 1405 04/26/14 0405  NA 140 132*  K 3.7 4.1  CL 102 97  CO2 22 21  GLUCOSE 110* 135*  BUN 17 19  CREATININE 1.13* 1.16*  CALCIUM 9.6 8.9   Liver Function Tests:  Recent Labs Lab 04/25/14 1405  AST 16  ALT <5  ALKPHOS 72  BILITOT 0.3  PROT 7.4  ALBUMIN 3.6   No results found for this basename: LIPASE, AMYLASE,  in the last 168 hours No results found for this basename: AMMONIA,  in the last 168 hours CBC:  Recent Labs Lab 04/25/14 1405 04/26/14 0405  WBC 16.0* 17.0*  NEUTROABS 14.5*  --   HGB 11.8* 10.8*  HCT 37.3 34.4*  MCV 91.2 90.8  PLT 328 334   Cardiac Enzymes: No results found for this basename: CKTOTAL, CKMB, CKMBINDEX, TROPONINI,  in the last 168 hours BNP (last 3 results) No results found for this basename: PROBNP,  in the last 8760 hours CBG: No results found for this basename: GLUCAP,  in the last 168 hours  Recent Results (from the past 240 hour(s))  CULTURE, BLOOD (ROUTINE X 2)     Status: None   Collection Time    04/25/14  2:07 PM      Result Value Ref Range Status   Specimen Description BLOOD LEFT ARM   Final   Special Requests BOTTLES DRAWN AEROBIC AND ANAEROBIC 5ML   Final   Culture  Setup Time     Final   Value: 04/25/2014 21:51     Performed at [facility]   Culture     Final   Value:        BLOOD CULTURE RECEIVED NO GROWTH TO DATE CULTURE WILL BE HELD FOR 5 DAYS BEFORE ISSUING A FINAL NEGATIVE REPORT     Performed at [facility]   Report Status PENDING   Incomplete  CULTURE, BLOOD (ROUTINE X 2)     Status: None   Collection Time    04/25/14  2:12 PM      Result Value Ref Range Status   Specimen Description BLOOD RIGHT ARM   Final   Special Requests BOTTLES DRAWN AEROBIC AND ANAEROBIC 5ML   Final   Culture  Setup Time     Final   Value: 04/25/2014 21:52     Performed at [facility]   Culture     Final    Value:        BLOOD CULTURE RECEIVED NO GROWTH TO DATE CULTURE WILL BE HELD FOR 5 DAYS BEFORE ISSUING A FINAL NEGATIVE REPORT     Performed at [facility]   Report Status PENDING   Incomplete  CULTURE,  EXPECTORATED SPUTUM-ASSESSMENT     Status: None   Collection Time    04/26/14  7:32 AM      Result Value Ref Range Status   Specimen Description SPUTUM   Final   Special Requests Immunocompromised   Final   Sputum evaluation     Final   Value: THIS SPECIMEN IS ACCEPTABLE. RESPIRATORY CULTURE REPORT TO FOLLOW.   Report Status 04/26/2014 FINAL   Final  CULTURE, RESPIRATORY (NON-EXPECTORATED)     Status: None   Collection Time    04/26/14  7:32 AM      Result Value Ref Range Status   Specimen Description SPUTUM   Final   Special Requests NONE   Final   Gram Stain     Final   Value: ABUNDANT WBC PRESENT,BOTH PMN AND MONONUCLEAR     NO SQUAMOUS EPITHELIAL CELLS SEEN     MODERATE GRAM POSITIVE COCCI IN PAIRS     Performed at [facility]   Culture PENDING   Incomplete   Report Status PENDING   Incomplete     Studies: Ct Angio Chest Pe W/cm &/or Wo Cm  04/25/2014   EXAM: CT ANGIOGRAPHY CHEST WITH CONTRAST  TECHNIQUE: Multidetector CT imaging of the chest was performed using the standard protocol during bolus administration of intravenous contrast. Multiplanar CT image reconstructions and MIPs were obtained to evaluate the vascular anatomy.  CONTRAST:  132mL OMNIPAQUE IOHEXOL 350 MG/ML SOLN  COMPARISON:  12/12/2012.  FINDINGS: No evidence for pulmonary emboli. Atheromatous change transverse arch. Sequelae of gastric pull-through, with dilated fluid-filled gastric pull up.  Atherosclerotic changes greatest at the descending thoracic cord are. Coronary artery calcifications with cardiomegaly. Slight pericardial effusion.  Moderate bilateral pleural effusions. COPD. Tiny bilateral pulmonary nodules) image 39, 41) noted previously not calcified.  Extensive left lower lobe  consolidation involving the superior segment and posterior basal segment could represent aspiration. Air-fluid level as seen on image 29 series 7 could represent pulmonary abscess versus layering fluid within an air cyst. Minor infiltrates or atelectatic changes right base.  Chronic compression deformity T5. Treated vertebral body compression fractures with vertebral augmentation at T6, T8, and T9. No worrisome osseous lesions.  Upper abdominal organs unremarkable.  Review of the MIP images confirms the above findings.  IMPRESSION: Extensive left lower lobe consolidation involving superior segment and posterior basal segments could represent aspiration pneumonia in this patient with a gastric pull-through and a dilated fluid-filled gastric pull up.  Air-fluid level could represent pulmonary abscess versus layering fluid within air cyst.  Bilateral pleural effusions.  No evidence for pulmonary emboli.   Electronically Signed   By: Rolla Flatten M.D.   On: 04/25/2014 15:42   Dg Esophagus  04/26/2014   CLINICAL DATA:  80 year old female with history of esophageal cancer, partial esophagectomy and gastric pull-through. Also treated with radiation. Admitted with pneumonia, difficulty swallowing, coughing, and choking. Initial encounter.  EXAM: ESOPHOGRAM/BARIUM SWALLOW  TECHNIQUE: Single contrast examination was performed using  barium.  FLUOROSCOPY TIME:  1 min 38 seconds.  COMPARISON:  Esophagram 12/27/2011. Chest and abdomen CT 12/12/2012.  FINDINGS: Due to limited mobility, the study was performed with the fluoroscopy table inclined at 45 degrees. A single contrast study was performed.  The patient tolerated PO barium well and without difficulty.  The patient did cough after some swallows of barium, but no definite aspiration was identified.  Sequelae of partial esophagectomy and gastric pull-through are noted. No obstruction to the Ford flow of barium into the  gastric pull-through. Barium was slow to empty from  the intra thoracic stomach into the abdomen, but at the conclusion of the study about half of the ingested barium had reached the duodenum C loop.  No anastomotic stricture identified.  No extravasation.  Numerous surgical clips in the mediastinum. Postprocedural abdominal film demonstrating extensive flank soft tissue calcifications, and previous bilateral hip arthroplasty.  IMPRESSION: 1. Satisfactory swallow study in this patient who is status post subtotal esophagectomy and gastric pull-through. 2. Some coughing was noted with swallowing during this study, raising the possibility of aspiration, but no definite aspiration was identified. A followup modified barium swallow would be more sensitive.   Electronically Signed   By: Lars Pinks M.D.   On: 04/26/2014 11:51   Dg Chest Portable 1 View  04/25/2014   CLINICAL DATA:  Weakness and shortness of breath.  COPD.  EXAM: PORTABLE CHEST - 1 VIEW  COMPARISON:  02/07/2014.  FINDINGS: Cardiomegaly persists. Calcified tortuous aorta. Mild vascular congestion. Slight left pleural effusion. Osteopenia.  IMPRESSION: Cardiomegaly with mild vascular congestion. Small left effusion. Worsening aeration from priors.   Electronically Signed   By: Rolla Flatten M.D.   On: 04/25/2014 14:17    Scheduled Meds: . albuterol  2.5 mg Nebulization QID  . amLODipine  2.5 mg Oral Daily  . antiseptic oral rinse  15 mL Mouth Rinse BID  . arformoterol  15 mcg Nebulization BID  . aspirin EC  81 mg Oral Daily  . atenolol  50 mg Oral BID  . budesonide (PULMICORT) nebulizer solution  0.25 mg Nebulization BID  . enoxaparin (LOVENOX) injection  40 mg Subcutaneous Q24H  . lisinopril  40 mg Oral q morning - 10a  . loratadine  10 mg Oral Daily  . pantoprazole  40 mg Oral BID AC  . piperacillin-tazobactam (ZOSYN)  IV  3.375 g Intravenous 3 times per day  . predniSONE  6 mg Oral Q breakfast  . raloxifene  60 mg Oral q morning - 10a  . traMADol  100 mg Oral BID  . Vitamin D  (Ergocalciferol)  50,000 Units Oral Q21 days   Continuous Infusions: . dextrose 5 % and 0.45 % NaCl with KCl 20 mEq/L 75 mL/hr at 04/26/14 0607    Principal Problem:   Aspiration pneumonia Active Problems:   Pernicious anemia   Arthritis   Adenocarcinoma of gastroesophageal junction   Hypertension   Degenerative arthritis of hip   Compression fracture of thoracic vertebra   CAP (community acquired pneumonia)   Dysphagia   Odynophagia   Globus sensation   COPD (chronic obstructive pulmonary disease)   Leukocytosis   Sepsis   Acute respiratory failure with hypoxia    Time spent: 30 min    [facility] Pager [phone]. If 7PM-7AM, please contact night-coverage at [email or web address], password [facility] 04/26/2014, 2:11 PM  LOS: 1 day

## 2014-04-27 DIAGNOSIS — C160 Malignant neoplasm of cardia: Secondary | ICD-10-CM

## 2014-04-27 DIAGNOSIS — D72829 Elevated white blood cell count, unspecified: Secondary | ICD-10-CM

## 2014-04-27 LAB — URINE CULTURE
Colony Count: NO GROWTH
Culture: NO GROWTH

## 2014-04-27 LAB — BASIC METABOLIC PANEL
BUN: 18 mg/dL (ref 6–23)
CALCIUM: 9.4 mg/dL (ref 8.4–10.5)
CO2: 24 mEq/L (ref 19–32)
Chloride: 101 mEq/L (ref 96–112)
Creatinine, Ser: 1.01 mg/dL (ref 0.50–1.10)
GFR, EST AFRICAN AMERICAN: 61 mL/min — AB (ref >90)
GFR, EST NON AFRICAN AMERICAN: 52 mL/min — AB (ref >90)
Glucose, Bld: 109 mg/dL — ABNORMAL HIGH (ref 70–99)
POTASSIUM: 4.2 meq/L (ref 3.7–5.3)
SODIUM: 135 meq/L — AB (ref 137–147)

## 2014-04-27 LAB — CBC
HCT: 31.5 % — ABNORMAL LOW (ref 36.0–46.0)
Hemoglobin: 9.8 g/dL — ABNORMAL LOW (ref 12.0–15.0)
MCH: 28.6 pg (ref 26.0–34.0)
MCHC: 31.1 g/dL (ref 30.0–36.0)
MCV: 91.8 fL (ref 78.0–100.0)
Platelets: 240 10*3/uL (ref 150–400)
RBC: 3.43 MIL/uL — AB (ref 3.87–5.11)
RDW: 16.4 % — ABNORMAL HIGH (ref 11.5–15.5)
WBC: 14.4 10*3/uL — AB (ref 4.0–10.5)

## 2014-04-27 LAB — TSH: TSH: 0.885 u[IU]/mL (ref 0.350–4.500)

## 2014-04-27 MED ORDER — FLUCONAZOLE 100 MG PO TABS
100.0000 mg | ORAL_TABLET | Freq: Every day | ORAL | Status: DC
  Administered 2014-04-27 – 2014-05-02 (×6): 100 mg via ORAL
  Filled 2014-04-27 (×7): qty 1

## 2014-04-27 MED ORDER — SENNA 8.6 MG PO TABS
2.0000 | ORAL_TABLET | Freq: Every evening | ORAL | Status: DC | PRN
Start: 2014-04-27 — End: 2014-05-01
  Administered 2014-04-27: 17.2 mg via ORAL
  Filled 2014-04-27: qty 2

## 2014-04-27 MED ORDER — SACCHAROMYCES BOULARDII 250 MG PO CAPS
250.0000 mg | ORAL_CAPSULE | Freq: Two times a day (BID) | ORAL | Status: DC
  Administered 2014-04-27 – 2014-05-02 (×10): 250 mg via ORAL
  Filled 2014-04-27 (×11): qty 1

## 2014-04-27 NOTE — Progress Notes (Addendum)
[facility] PROGRESS NOTE  [name] TKW:[id number] DOB: 1934/10/20 DOA: 04/25/2014 PCP: [name], MD  Assessment/Plan  Sepsis and acute hypoxic respiratory failure due to aspiration pneumonia with possible developing lung abscess.  Temperature trended down, however, WBC stable to higher - Blood cultures NGTD - Sputum culture pending - Legionella neg, s. pneumo ag positive - Continue Zosyn due to suspicion of mixed flora -  Escalate to vancomycin if patient decompensates -  Anticipate transitioning to augmentin for long 4-6 week course with f/u CT in 2-4 weeks to follow possible lung abscess - Continuous pulse oximetry  -  Start florastor  Yeast infection -  Start fluconazole to continue until off antibiotics  Dysphagia and odynophagia  - Speech therapy consult pending - Esophogram:  Liquid does pass, although slowly from intrathoracic stomach to below diaphragm - Change PPI to protonix BID (aware of increased aspiration risk but patient with severe symptoms)   Epigastric pain, chronic  - Continue increased PPI  - Continue tramadol  - Tylenol as needed   SVT and PVC - Keep electrolytes wnl  - TSH 0.885 -  Patient recently had ECHO done which demonstrated normal structure and function 02/11/2014 -  When BP tolerates, recommend titrating up BB as tolerated -  Cardiology f/u -  Okay to d/c telemetry  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 [name] two months ago  - Dr. [name] added to rounding list   Chronic steroid use due to arthritis. Low dose but could still be at risk for iatrogenic adrenal insufficiency. BP currently stable.  - Increase to three times normal dose, day 3 of 3 - Stress dose if decompensates   Constipation, stable   COPD with increased SOB and diminshed breath sounds, but does not appear to have COPD exacerbation  - Try to avoid systemic steroids for now  - continue brovana and pulmicort  - Hold  symbicort  - Albuterol four times daily  - Hold ipratropium because of hx of acute urinary retention. Can start if not responding to above treatments with close monitoring of urine output.   HTN, blood pressure low normal -  Hold Norvasc and lisinopril  -  Please hold parameters for BP on BB  Leukocytosis due to pneumonia, improving - Trend WBC  Pernicious anemia, defer to outpatient management. Hgb trending down.  May be secondary to zosyn -  Trend hgb  Anxiety/depression, stable, continue ativan prn. St. John's Wort on hold  Hyponatremia, likely due to mild SIADH from pneumonia -  Repeat BMP in AM   Diet: Regular with thin  Access: PIV  IVF: yes  Proph: lovenox   Code Status: Full  Family Communication: patient alone  Disposition Plan:  Continue inpatient   Consultants:  None  Procedures:  CT chest  Esophagram  Antibiotics:  Clindamycin 5/21 x 1  Vancomycin 5/21 >>  Zosyn 5/21 >>   HPI/Subjective:  Feeling better, sputum and cough are improved.  Still has bad headache.  Getting yeast infection.    Objective: Filed Vitals:   04/27/14 0645 04/27/14 1140 04/27/14 1410 04/27/14 1525  BP: 122/66  127/65   Pulse: 88  88   Temp: 98 F (36.7 C)  97.4 F (36.3 C)   TempSrc: Oral  Oral   Resp: 20  18   Height:      Weight:      SpO2: 96% 96% 96% 97%    Intake/Output Summary (Last 24 hours)  at 04/27/14 1546 Last data filed at 04/27/14 1410  Gross per 24 hour  Intake    120 ml  Output    825 ml  Net   -705 ml   Filed Weights   04/25/14 1814  Weight: 63.5 kg (139 lb 15.9 oz)    Exam:   General:  Thin CF, mild resp distress with SCM retractions, tachypnea after getting up to use bathroom, improved  HEENT:  NCAT, MMM  Cardiovascular:  RRR, nl S1, S2 no mrg, 2+ pulses, warm extremities  Respiratory:  Rales at the left base with improved aeration, + rhonchi, no wheeze, no increased WOB  Abdomen:   NABS, soft, soft large upper abdomen hernia,  mild TTP in epigastrium   MSK:   Normal tone and bulk, 1+ RLE edema  Neuro:  Grossly intact  Data Reviewed: Basic Metabolic Panel:  Recent Labs Lab 04/25/14 1405 04/26/14 0405 04/27/14 0503  NA 140 132* 135*  K 3.7 4.1 4.2  CL 102 97 101  CO2 22 21 24   GLUCOSE 110* 135* 109*  BUN 17 19 18   CREATININE 1.13* 1.16* 1.01  CALCIUM 9.6 8.9 9.4   Liver Function Tests:  Recent Labs Lab 04/25/14 1405  AST 16  ALT <5  ALKPHOS 72  BILITOT 0.3  PROT 7.4  ALBUMIN 3.6   No results found for this basename: LIPASE, AMYLASE,  in the last 168 hours No results found for this basename: AMMONIA,  in the last 168 hours CBC:  Recent Labs Lab 04/25/14 1405 04/26/14 0405 04/27/14 0503  WBC 16.0* 17.0* 14.4*  NEUTROABS 14.5*  --   --   HGB 11.8* 10.8* 9.8*  HCT 37.3 34.4* 31.5*  MCV 91.2 90.8 91.8  PLT 328 334 240   Cardiac Enzymes: No results found for this basename: CKTOTAL, CKMB, CKMBINDEX, TROPONINI,  in the last 168 hours BNP (last 3 results) No results found for this basename: PROBNP,  in the last 8760 hours CBG: No results found for this basename: GLUCAP,  in the last 168 hours  Recent Results (from the past 240 hour(s))  CULTURE, BLOOD (ROUTINE X 2)     Status: None   Collection Time    04/25/14  2:07 PM      Result Value Ref Range Status   Specimen Description BLOOD LEFT ARM   Final   Special Requests BOTTLES DRAWN AEROBIC AND ANAEROBIC 5ML   Final   Culture  Setup Time     Final   Value: 04/25/2014 21:51     Performed at [facility]   Culture     Final   Value:        BLOOD CULTURE RECEIVED NO GROWTH TO DATE CULTURE WILL BE HELD FOR 5 DAYS BEFORE ISSUING A FINAL NEGATIVE REPORT     Performed at [facility]   Report Status PENDING   Incomplete  CULTURE, BLOOD (ROUTINE X 2)     Status: None   Collection Time    04/25/14  2:12 PM      Result Value Ref Range Status   Specimen Description BLOOD RIGHT ARM   Final   Special Requests BOTTLES  DRAWN AEROBIC AND ANAEROBIC 5ML   Final   Culture  Setup Time     Final   Value: 04/25/2014 21:52     Performed at [place]     Final   Value:        BLOOD CULTURE RECEIVED  NO GROWTH TO DATE CULTURE WILL BE HELD FOR 5 DAYS BEFORE ISSUING A FINAL NEGATIVE REPORT     Performed at [facility]   Report Status PENDING   Incomplete  URINE CULTURE     Status: None   Collection Time    04/25/14  4:16 PM      Result Value Ref Range Status   Specimen Description URINE, CLEAN CATCH   Final   Special Requests NONE   Final   Culture  Setup Time     Final   Value: 04/26/2014 00:42     Performed at [facility]     Final   Value: NO GROWTH     Performed at [facility]   Culture     Final   Value: NO GROWTH     Performed at [facility]   Report Status 04/27/2014 FINAL   Final  CULTURE, EXPECTORATED SPUTUM-ASSESSMENT     Status: None   Collection Time    04/26/14  7:32 AM      Result Value Ref Range Status   Specimen Description SPUTUM   Final   Special Requests Immunocompromised   Final   Sputum evaluation     Final   Value: THIS SPECIMEN IS ACCEPTABLE. RESPIRATORY CULTURE REPORT TO FOLLOW.   Report Status 04/26/2014 FINAL   Final  CULTURE, RESPIRATORY (NON-EXPECTORATED)     Status: None   Collection Time    04/26/14  7:32 AM      Result Value Ref Range Status   Specimen Description SPUTUM   Final   Special Requests NONE   Final   Gram Stain     Final   Value: ABUNDANT WBC PRESENT,BOTH PMN AND MONONUCLEAR     NO SQUAMOUS EPITHELIAL CELLS SEEN     MODERATE GRAM POSITIVE COCCI IN PAIRS     Performed at [facility]   Culture     Final   Value: FEW CANDIDA ALBICANS     Performed at [facility]   Report Status PENDING   Incomplete     Studies: Dg Esophagus  04/26/2014   CLINICAL DATA:  80 year old female with history of esophageal cancer, partial esophagectomy and gastric pull-through. Also  treated with radiation. Admitted with pneumonia, difficulty swallowing, coughing, and choking. Initial encounter.  EXAM: ESOPHOGRAM/BARIUM SWALLOW  TECHNIQUE: Single contrast examination was performed using  barium.  FLUOROSCOPY TIME:  1 min 38 seconds.  COMPARISON:  Esophagram 12/27/2011. Chest and abdomen CT 12/12/2012.  FINDINGS: Due to limited mobility, the study was performed with the fluoroscopy table inclined at 45 degrees. A single contrast study was performed.  The patient tolerated PO barium well and without difficulty.  The patient did cough after some swallows of barium, but no definite aspiration was identified.  Sequelae of partial esophagectomy and gastric pull-through are noted. No obstruction to the Ford flow of barium into the gastric pull-through. Barium was slow to empty from the intra thoracic stomach into the abdomen, but at the conclusion of the study about half of the ingested barium had reached the duodenum C loop.  No anastomotic stricture identified.  No extravasation.  Numerous surgical clips in the mediastinum. Postprocedural abdominal film demonstrating extensive flank soft tissue calcifications, and previous bilateral hip arthroplasty.  IMPRESSION: 1. Satisfactory swallow study in this patient who is status post subtotal esophagectomy and gastric pull-through. 2. Some coughing was noted with swallowing during this study, raising the possibility of  aspiration, but no definite aspiration was identified. A followup modified barium swallow would be more sensitive.   Electronically Signed   By: Lars Pinks M.D.   On: 04/26/2014 11:51    Scheduled Meds: . albuterol  2.5 mg Nebulization TID  . antiseptic oral rinse  15 mL Mouth Rinse BID  . arformoterol  15 mcg Nebulization BID  . aspirin EC  81 mg Oral Daily  . atenolol  25 mg Oral BID  . budesonide (PULMICORT) nebulizer solution  0.25 mg Nebulization BID  . enoxaparin (LOVENOX) injection  40 mg Subcutaneous Q24H  . ibuprofen  600  mg Oral Once  . loratadine  10 mg Oral Daily  . pantoprazole  40 mg Oral BID AC  . piperacillin-tazobactam (ZOSYN)  IV  3.375 g Intravenous 3 times per day  . predniSONE  6 mg Oral Q breakfast  . raloxifene  60 mg Oral q morning - 10a  . traMADol  100 mg Oral BID  . Vitamin D (Ergocalciferol)  50,000 Units Oral Q21 days   Continuous Infusions: . dextrose 5 % and 0.45 % NaCl with KCl 20 mEq/L 75 mL/hr at 04/27/14 3151    Principal Problem:   Aspiration pneumonia Active Problems:   Pernicious anemia   Arthritis   Adenocarcinoma of gastroesophageal junction   Hypertension   Degenerative arthritis of hip   Compression fracture of thoracic vertebra   CAP (community acquired pneumonia)   Dysphagia   Odynophagia   Globus sensation   COPD (chronic obstructive pulmonary disease)   Leukocytosis   Sepsis   Acute respiratory failure with hypoxia    Time spent: 30 min    [facility] Pager [phone]. If 7PM-7AM, please contact night-coverage at [email or web address], password [facility] 04/27/2014, 3:46 PM  LOS: 2 days

## 2014-04-27 NOTE — Progress Notes (Signed)
Clinical Social Work Department BRIEF PSYCHOSOCIAL ASSESSMENT 04/27/2014  Patient:  [name], [name]     Account Number:  [id number]     Admit date:  04/25/2014  Clinical Social Worker:  [name]  Date/Time:  04/27/2014 03:05 PM  Referred by:  Physician  Date Referred:  04/27/2014 Referred for  Other - See comment   Other Referral:   Pt's son lives with Pt and drinks constantly   Interview type:  Patient Other interview type:    PSYCHOSOCIAL DATA Living Status:  FAMILY Admitted from facility:   Level of care:   Primary support name:  [name] Primary support relationship to patient:  CHILD, ADULT Degree of support available:   adequate    CURRENT CONCERNS Current Concerns  Other - See comment   Other Concerns:   Pt's son constantly drunk    SOCIAL WORK ASSESSMENT / PLAN Lengthy conversation had with Pt re: her current living situation.    Pt stated that she has lived in her home for 45 years and that her son has lived there, too, for almost 39.  She explaiend that her other 3 children have died, either from alcohol/drugs/pills or from a car accident.  Additionally, she's had several extended family members die from other mental health issues.    Pt stated that her son is a binge drinker and that he was sober for 2 years while she underwent tx for her cancer. She stated that he took very good care of her during that time but, over the past 3-6 months, Pt has been on a binge and that it's the worst one ever.  Pt stated that her son does very little around the home, as he's drunk all the time.  She stated that she cooks "a little bit" but that she can't clean like she'd like.    CSW and Pt discussed all options available to her, including going to an ALF.  Pt was staunchly opposed to that and stated that she wanted to remain as long as she can in her own home.  She stated that she has thought about kicking him out but can't bring herself to do so.    After  exhausting all options known to both Pt and CSW, Pt stated that she intends to keep doing what she's doing and see how things go.  CSW encouraged Pt to contact [facility], should she require assistance once she's d/c'd.  Pt made aware that CSW available to her, should she need assistance with anyting prior to d/c.    CSW offered emotional support and thanked Pt for her time.   Assessment/plan status:  No Further Intervention Required Other assessment/ plan:   Information/referral to community resources:   n/a    PATIENT'S/FAMILY'S RESPONSE TO PLAN OF CARE: Pt was calm, cooperative and very pleasant.    Pt was having a tough time making decisions re: her son. In the end, she decided that she will d/c back to her home, with him still in it, and continue on with her life.   [name], [name] Social Work [phone]

## 2014-04-27 NOTE — Progress Notes (Signed)
Pt. C/o of pink tinged sputum,MD notified.

## 2014-04-28 DIAGNOSIS — R110 Nausea: Secondary | ICD-10-CM

## 2014-04-28 DIAGNOSIS — R1013 Epigastric pain: Secondary | ICD-10-CM

## 2014-04-28 LAB — BASIC METABOLIC PANEL
BUN: 12 mg/dL (ref 6–23)
CALCIUM: 9.5 mg/dL (ref 8.4–10.5)
CHLORIDE: 103 meq/L (ref 96–112)
CO2: 24 meq/L (ref 19–32)
Creatinine, Ser: 0.91 mg/dL (ref 0.50–1.10)
GFR calc Af Amer: 69 mL/min — ABNORMAL LOW (ref >90)
GFR calc non Af Amer: 59 mL/min — ABNORMAL LOW (ref >90)
Glucose, Bld: 96 mg/dL (ref 70–99)
Potassium: 4.4 mEq/L (ref 3.7–5.3)
SODIUM: 138 meq/L (ref 137–147)

## 2014-04-28 LAB — CBC
HEMATOCRIT: 32.9 % — AB (ref 36.0–46.0)
HEMOGLOBIN: 10.2 g/dL — AB (ref 12.0–15.0)
MCH: 28.6 pg (ref 26.0–34.0)
MCHC: 31 g/dL (ref 30.0–36.0)
MCV: 92.2 fL (ref 78.0–100.0)
PLATELETS: 284 10*3/uL (ref 150–400)
RBC: 3.57 MIL/uL — AB (ref 3.87–5.11)
RDW: 16.1 % — ABNORMAL HIGH (ref 11.5–15.5)
WBC: 8.3 10*3/uL (ref 4.0–10.5)

## 2014-04-28 LAB — CULTURE, RESPIRATORY

## 2014-04-28 LAB — CULTURE, RESPIRATORY W GRAM STAIN

## 2014-04-28 MED ORDER — LORAZEPAM 2 MG/ML IJ SOLN
0.5000 mg | Freq: Four times a day (QID) | INTRAMUSCULAR | Status: DC | PRN
  Administered 2014-04-30 – 2014-05-01 (×2): 0.5 mg via INTRAVENOUS
  Filled 2014-04-28 (×2): qty 1

## 2014-04-28 MED ORDER — IBUPROFEN 100 MG/5ML PO SUSP
400.0000 mg | Freq: Four times a day (QID) | ORAL | Status: DC | PRN
  Administered 2014-04-30 – 2014-05-02 (×2): 400 mg via ORAL
  Filled 2014-04-28 (×2): qty 20

## 2014-04-28 MED ORDER — PROMETHAZINE HCL 25 MG/ML IJ SOLN
12.5000 mg | Freq: Four times a day (QID) | INTRAMUSCULAR | Status: DC | PRN
  Administered 2014-04-28 – 2014-04-30 (×2): 12.5 mg via INTRAVENOUS
  Filled 2014-04-28 (×2): qty 1

## 2014-04-28 MED ORDER — ATENOLOL 50 MG PO TABS
50.0000 mg | ORAL_TABLET | Freq: Two times a day (BID) | ORAL | Status: DC
  Administered 2014-04-29 – 2014-05-02 (×8): 50 mg via ORAL
  Filled 2014-04-28 (×9): qty 1

## 2014-04-28 MED ORDER — ACETAMINOPHEN 160 MG/5ML PO SOLN
650.0000 mg | Freq: Three times a day (TID) | ORAL | Status: DC
  Administered 2014-04-28 – 2014-05-02 (×13): 650 mg via ORAL
  Filled 2014-04-28 (×11): qty 20.3

## 2014-04-28 MED ORDER — GI COCKTAIL ~~LOC~~
30.0000 mL | Freq: Three times a day (TID) | ORAL | Status: DC | PRN
  Filled 2014-04-28: qty 30

## 2014-04-28 MED ORDER — PREDNISONE 1 MG PO TABS
2.0000 mg | ORAL_TABLET | Freq: Every day | ORAL | Status: DC
  Administered 2014-04-28 – 2014-05-02 (×5): 2 mg via ORAL
  Filled 2014-04-28 (×6): qty 2

## 2014-04-28 MED ORDER — PREDNISONE 1 MG PO TABS
2.0000 mg | ORAL_TABLET | Freq: Every day | ORAL | Status: DC
  Filled 2014-04-28: qty 2

## 2014-04-28 MED ORDER — TRAMADOL HCL 50 MG PO TABS
100.0000 mg | ORAL_TABLET | Freq: Three times a day (TID) | ORAL | Status: DC
  Administered 2014-04-28 – 2014-05-02 (×13): 100 mg via ORAL
  Filled 2014-04-28 (×13): qty 2

## 2014-04-28 NOTE — Progress Notes (Signed)
Patient c/o heart beating fast.  EKG done- Pt. Was drinking a lot of caffeine at time.  Pt. having a lot of PVCs.  Encouraged pt. to have water instead of caffeine  Will continue to monitor pt.

## 2014-04-28 NOTE — Progress Notes (Addendum)
[facility] PROGRESS NOTE  Yolanda ROPPOLO VZD:[id number] DOB: [birth year] DOA: 04/25/2014 PCP: [name], MD  Assessment/Plan  Sepsis and acute hypoxic respiratory failure due to aspiration pneumonia with possible developing lung abscess.  Temperature and WBC trending down - Blood cultures NGTD - Sputum culture pending - Legionella neg, s. pneumo ag positive - Continue Zosyn due to suspicion of mixed flora -  Anticipate transitioning to augmentin for long 4-6 week course with f/u CT in 2-4 weeks to follow possible lung abscess - Continuous pulse oximetry  - Continue florastor  Yeast infection -  Start fluconazole to continue until off antibiotics  Nausea and heaves -  Add prn phenergan -  Continue PPI -  Add GI cocktail -  Change to bariatric FLD -  Continue IVF  Headache -  Scheduled tylenol TID with ibuprofen prn  Dysphagia and odynophagia  - Appreciate speech therapy assistance - Esophogram:  Liquid does pass, although slowly from intrathoracic stomach to below diaphragm - Continue BID protonix  Epigastric pain, chronic  - Continue increased PPI  - Increase tramadol to TID - Try to minimize prn fentanyl as this may be contributing to nausea and vomiting - Tylenol as needed   SVT and PVC - Keep electrolytes wnl  - TSH 0.885 -  Patient recently had ECHO done which demonstrated normal structure and function 02/11/2014 -  Increase BB  -  Cardiology f/u  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 [name] two months ago  - Dr. [name] added to rounding list   Chronic steroid use due to arthritis. Low dose but could still be at risk for iatrogenic adrenal insufficiency. BP currently stable.   -  Decrease to base dose of 2mg  daily  Constipation, stable   COPD with increased SOB and diminshed breath sounds, but does not appear to have COPD exacerbation  - Try to avoid systemic steroids - continue brovana and pulmicort  -  Hold symbicort  - Albuterol four times daily  - Hold ipratropium because of hx of acute urinary retention. Can start if not responding to above treatments with close monitoring of urine output.   HTN, blood pressure improving -  Hold Norvasc and lisinopril  -  Increase BB  Leukocytosis due to pneumonia, improving - Trend WBC  Pernicious anemia, defer to outpatient management. Hgb trending down.  May be secondary to zosyn -  Trend hgb  Anxiety/depression, stable, continue ativan prn. St. John's Wort on hold  Hyponatremia, likely due to mild SIADH from pneumonia, resolved.  Diet: FLD Access: PIV  IVF: yes  Proph: lovenox   Code Status: Full  Family Communication: patient alone  Disposition Plan:  Continue inpatient, PT eval   Consultants:  None  Procedures:  CT chest  Esophagram  Antibiotics:  Clindamycin 5/21 x 1  Vancomycin 5/21 x 1  Zosyn 5/21 >>   HPI/Subjective:  Feels very unwell, increased nausea and abdominal pain, headache persists.  Not able to tolerate tabs so not getting her tylenol.  Fentanyl helps pain.    Objective: Filed Vitals:   04/27/14 2135 04/27/14 2153 04/28/14 0458 04/28/14 0811  BP:   141/89   Pulse:   85   Temp:   98.1 F (36.7 C)   TempSrc:   Oral   Resp:   18   Height:      Weight:      SpO2: 97% 97% 95% 91%    Intake/Output Summary (Last 24  hours) at 04/28/14 1100 Last data filed at 04/28/14 0946  Gross per 24 hour  Intake 1568.75 ml  Output   1400 ml  Net 168.75 ml   Filed Weights   04/25/14 1814  Weight: 63.5 kg (139 lb 15.9 oz)    Exam:   General:  Thin CF, NAD  HEENT:  NCAT, MMM  Cardiovascular:  RRR, nl S1, S2 no mrg, 2+ pulses, warm extremities  Respiratory:  CTAB anteriorly, no increased WOB  Abdomen:   NABS, soft, soft large upper abdomen hernia, mild TTP in epigastrium   MSK:   Normal tone and bulk, 1+ RLE edema  Neuro:  Grossly intact  Data Reviewed: Basic Metabolic Panel:  Recent  Labs Lab 04/25/14 1405 04/26/14 0405 04/27/14 0503 04/28/14 0456  NA 140 132* 135* 138  K 3.7 4.1 4.2 4.4  CL 102 97 101 103  CO2 22 21 24 24   GLUCOSE 110* 135* 109* 96  BUN 17 19 18 12   CREATININE 1.13* 1.16* 1.01 0.91  CALCIUM 9.6 8.9 9.4 9.5   Liver Function Tests:  Recent Labs Lab 04/25/14 1405  AST 16  ALT <5  ALKPHOS 72  BILITOT 0.3  PROT 7.4  ALBUMIN 3.6   No results found for this basename: LIPASE, AMYLASE,  in the last 168 hours No results found for this basename: AMMONIA,  in the last 168 hours CBC:  Recent Labs Lab 04/25/14 1405 04/26/14 0405 04/27/14 0503 04/28/14 0456  WBC 16.0* 17.0* 14.4* 8.3  NEUTROABS 14.5*  --   --   --   HGB 11.8* 10.8* 9.8* 10.2*  HCT 37.3 34.4* 31.5* 32.9*  MCV 91.2 90.8 91.8 92.2  PLT 328 334 240 284   Cardiac Enzymes: No results found for this basename: CKTOTAL, CKMB, CKMBINDEX, TROPONINI,  in the last 168 hours BNP (last 3 results) No results found for this basename: PROBNP,  in the last 8760 hours CBG: No results found for this basename: GLUCAP,  in the last 168 hours  Recent Results (from the past 240 hour(s))  CULTURE, BLOOD (ROUTINE X 2)     Status: None   Collection Time    04/25/14  2:07 PM      Result Value Ref Range Status   Specimen Description BLOOD LEFT ARM   Final   Special Requests BOTTLES DRAWN AEROBIC AND ANAEROBIC 5ML   Final   Culture  Setup Time     Final   Value: 04/25/2014 21:51     Performed at [facility]   Culture     Final   Value:        BLOOD CULTURE RECEIVED NO GROWTH TO DATE CULTURE WILL BE HELD FOR 5 DAYS BEFORE ISSUING A FINAL NEGATIVE REPORT     Performed at [facility]   Report Status PENDING   Incomplete  CULTURE, BLOOD (ROUTINE X 2)     Status: None   Collection Time    04/25/14  2:12 PM      Result Value Ref Range Status   Specimen Description BLOOD RIGHT ARM   Final   Special Requests BOTTLES DRAWN AEROBIC AND ANAEROBIC 5ML   Final   Culture  Setup Time      Final   Value: 04/25/2014 21:52     Performed at [facility]     Final   Value:        BLOOD CULTURE RECEIVED NO GROWTH TO DATE CULTURE WILL BE HELD  FOR 5 DAYS BEFORE ISSUING A FINAL NEGATIVE REPORT     Performed at [facility]   Report Status PENDING   Incomplete  URINE CULTURE     Status: None   Collection Time    04/25/14  4:16 PM      Result Value Ref Range Status   Specimen Description URINE, CLEAN CATCH   Final   Special Requests NONE   Final   Culture  Setup Time     Final   Value: 04/26/2014 00:42     Performed at [facility]     Final   Value: NO GROWTH     Performed at [facility]   Culture     Final   Value: NO GROWTH     Performed at [facility]   Report Status 04/27/2014 FINAL   Final  CULTURE, EXPECTORATED SPUTUM-ASSESSMENT     Status: None   Collection Time    04/26/14  7:32 AM      Result Value Ref Range Status   Specimen Description SPUTUM   Final   Special Requests Immunocompromised   Final   Sputum evaluation     Final   Value: THIS SPECIMEN IS ACCEPTABLE. RESPIRATORY CULTURE REPORT TO FOLLOW.   Report Status 04/26/2014 FINAL   Final  CULTURE, RESPIRATORY (NON-EXPECTORATED)     Status: None   Collection Time    04/26/14  7:32 AM      Result Value Ref Range Status   Specimen Description SPUTUM   Final   Special Requests NONE   Final   Gram Stain     Final   Value: ABUNDANT WBC PRESENT,BOTH PMN AND MONONUCLEAR     NO SQUAMOUS EPITHELIAL CELLS SEEN     MODERATE GRAM POSITIVE COCCI IN PAIRS     Performed at [facility]   Culture     Final   Value: FEW CANDIDA ALBICANS     Performed at [facility]   Report Status 04/28/2014 FINAL   Final     Studies: No results found.  Scheduled Meds: . albuterol  2.5 mg Nebulization TID  . antiseptic oral rinse  15 mL Mouth Rinse BID  . arformoterol  15 mcg Nebulization BID  . aspirin EC  81 mg Oral Daily  . atenolol  25  mg Oral BID  . budesonide (PULMICORT) nebulizer solution  0.25 mg Nebulization BID  . enoxaparin (LOVENOX) injection  40 mg Subcutaneous Q24H  . fluconazole  100 mg Oral Daily  . ibuprofen  600 mg Oral Once  . loratadine  10 mg Oral Daily  . pantoprazole  40 mg Oral BID AC  . piperacillin-tazobactam (ZOSYN)  IV  3.375 g Intravenous 3 times per day  . predniSONE  6 mg Oral Q breakfast  . raloxifene  60 mg Oral q morning - 10a  . saccharomyces boulardii  250 mg Oral BID  . traMADol  100 mg Oral BID  . Vitamin D (Ergocalciferol)  50,000 Units Oral Q21 days   Continuous Infusions: . dextrose 5 % and 0.45 % NaCl with KCl 20 mEq/L 75 mL/hr at 04/27/14 8921    Principal Problem:   Aspiration pneumonia Active Problems:   Pernicious anemia   Arthritis   Adenocarcinoma of gastroesophageal junction   Hypertension   Degenerative arthritis of hip   Compression fracture of thoracic vertebra   CAP (community acquired pneumonia)   Dysphagia   Odynophagia  Globus sensation   COPD (chronic obstructive pulmonary disease)   Leukocytosis   Sepsis   Acute respiratory failure with hypoxia    Time spent: 30 min    [facility] Pager [phone]. If 7PM-7AM, please contact night-coverage at [email or web address], password [facility] 04/28/2014, 11:00 AM  LOS: 3 days

## 2014-04-28 NOTE — Evaluation (Signed)
Physical Therapy Evaluation Patient Details Name: [name] MRN: [id number] DOB: 1934-11-06 Today's Date: 04/28/2014   History of Present Illness  80 yo female admitted with asp pna. Hx of COPD, anemia, thoracic comp fx, HTN.   Clinical Impression  On eval, pt required Min guard assist for mobility-able to ambulate ~20 feet without assistive device. Pt declined to ambulate beyond that distance at time of eval-will further assess gait/ambulation on next visit.     Follow Up Recommendations Home health PT;No PT follow up (depending on progress)    Equipment Recommendations  None recommended by PT    Recommendations for Other Services       Precautions / Restrictions Precautions Precautions: Fall Restrictions Weight Bearing Restrictions: No      Mobility  Bed Mobility Overal bed mobility: Modified Independent Bed Mobility: Supine to Sit;Sit to Supine     Supine to sit: Modified independent (Device/Increase time) Sit to supine: Modified independent (Device/Increase time)      Transfers Overall transfer level: Needs assistance   Transfers: Sit to/from Stand Sit to Stand: Min guard         General transfer comment: close guard for safety  Ambulation/Gait Ambulation/Gait assistance: Min guard Ambulation Distance (Feet): 20 Feet Assistive device: None Gait Pattern/deviations: Decreased stride length     General Gait Details: quick pace. Pt declined to ambulate any farther.   Stairs            Wheelchair Mobility    Modified Rankin (Stroke Patients Only)       Balance                                             Pertinent Vitals/Pain Chronic abdominal pain-unrated. Pt also c/o some nausea    Home Living Family/patient expects to be discharged to:: Private residence Living Arrangements: Children   Type of Home: House Home Access: Level entry     [place]: Two level;Able to live on main level with  bedroom/bathroom Home Equipment: Gilford Rile - 2 wheels;Cane - single point;Crutches      Prior Function Level of Independence: Independent               Hand Dominance        Extremity/Trunk Assessment   Upper Extremity Assessment: Overall WFL for tasks assessed           Lower Extremity Assessment: Generalized weakness      Cervical / Trunk Assessment: Normal  Communication   Communication: No difficulties  Cognition Arousal/Alertness: Awake/alert Behavior During Therapy: WFL for tasks assessed/performed Overall Cognitive Status: Within Functional Limits for tasks assessed                      General Comments      Exercises        Assessment/Plan    PT Assessment Patient needs continued PT services  PT Diagnosis Difficulty walking   PT Problem List Decreased activity tolerance;Decreased mobility  PT Treatment Interventions Gait training;Functional mobility training;Therapeutic activities;Therapeutic exercise;Patient/family education   PT Goals (Current goals can be found in the Care Plan section) Acute Rehab PT Goals Patient Stated Goal: to get some rest PT Goal Formulation: With patient Time For Goal Achievement: 05/12/14 Potential to Achieve Goals: Good    Frequency Min 3X/week   Barriers to discharge  Co-evaluation               End of Session   Activity Tolerance: Patient tolerated treatment well Patient left: in bed;with call bell/phone within reach           Time: 2505-3976 PT Time Calculation (min): 8 min   Charges:   PT Evaluation $Initial PT Evaluation Tier I: 1 Procedure PT Treatments $Gait Training: 8-22 mins   PT G Codes:          Weston Anna, MPT Pager: [phone]

## 2014-04-28 NOTE — Progress Notes (Signed)
ANTIBIOTIC CONSULT NOTE - FOLLOW UP  Pharmacy Consult for zosyn Indication: Aspiration PNA/Pulmonary abscess    Allergies  Allergen Reactions  . Keflex [Cephalexin]     "severe itching"  . Nulecit [Na Ferric Gluc Cplx In Sucrose] Diarrhea, Nausea Only and Other (See Comments)      drop in BP  . Spiriva Handihaler [Tiotropium Bromide Monohydrate]     "made me stop going to the bathroom"  . Adhesive [Tape] Itching  . Clarithromycin Itching  . Darvon Itching  . Epinephrine Itching  . Morphine And Related Itching  . Oxycodone Itching  . Sulfa Antibiotics Itching  . Vicodin [Hydrocodone-Acetaminophen] Itching    Can take with Benadryl    Patient Measurements: Height: 5\' 4"  (162.6 cm) Weight: 139 lb 15.9 oz (63.5 kg) IBW/kg (Calculated) : 54.7 Adjusted Body Weight:   Vital Signs: Temp: 98.1 F (36.7 C) (05/24 0458) Temp src: Oral (05/24 0458) BP: 141/89 mmHg (05/24 0458) Pulse Rate: 85 (05/24 0458) Intake/Output from previous day: 05/23 0701 - 05/24 0700 In: 1688.8 [P.O.:120; I.V.:1318.8; IV Piggyback:250] Out: 1325 [Urine:300; DZHGD:9242] Intake/Output from this shift: Total I/O In: -  Out: 500 [Urine:500]  Labs:  Recent Labs  04/26/14 0405 04/27/14 0503 04/28/14 0456  WBC 17.0* 14.4* 8.3  HGB 10.8* 9.8* 10.2*  PLT 334 240 284  CREATININE 1.16* 1.01 0.91   Estimated Creatinine Clearance: 44.7 ml/min (by C-G formula based on Cr of 0.91). No results found for this basename: VANCOTROUGH, VANCOPEAK, VANCORANDOM, [place], [place], [place], [place], [place], TOBRARND, AMIKACINPEAK, AMIKACINTROU, AMIKACIN,  in the last 72 hours   Microbiology: Recent Results (from the past 720 hour(s))  CULTURE, BLOOD (ROUTINE X 2)     Status: None   Collection Time    04/25/14  2:07 PM      Result Value Ref Range Status   Specimen Description BLOOD LEFT ARM   Final   Special Requests BOTTLES DRAWN AEROBIC AND ANAEROBIC 5ML   Final   Culture  Setup Time     Final    Value: 04/25/2014 21:51     Performed at [facility]   Culture     Final   Value:        BLOOD CULTURE RECEIVED NO GROWTH TO DATE CULTURE WILL BE HELD FOR 5 DAYS BEFORE ISSUING A FINAL NEGATIVE REPORT     Performed at [facility]   Report Status PENDING   Incomplete  CULTURE, BLOOD (ROUTINE X 2)     Status: None   Collection Time    04/25/14  2:12 PM      Result Value Ref Range Status   Specimen Description BLOOD RIGHT ARM   Final   Special Requests BOTTLES DRAWN AEROBIC AND ANAEROBIC 5ML   Final   Culture  Setup Time     Final   Value: 04/25/2014 21:52     Performed at [facility]   Culture     Final   Value:        BLOOD CULTURE RECEIVED NO GROWTH TO DATE CULTURE WILL BE HELD FOR 5 DAYS BEFORE ISSUING A FINAL NEGATIVE REPORT     Performed at [facility]   Report Status PENDING   Incomplete  URINE CULTURE     Status: None   Collection Time    04/25/14  4:16 PM      Result Value Ref Range Status   Specimen Description URINE, CLEAN CATCH   Final   Special Requests NONE  Final   Culture  Setup Time     Final   Value: 04/26/2014 00:42     Performed at [place] [facility]     Final   Value: NO GROWTH     Performed at [facility]   Culture     Final   Value: NO GROWTH     Performed at [facility]   Report Status 04/27/2014 FINAL   Final  CULTURE, EXPECTORATED SPUTUM-ASSESSMENT     Status: None   Collection Time    04/26/14  7:32 AM      Result Value Ref Range Status   Specimen Description SPUTUM   Final   Special Requests Immunocompromised   Final   Sputum evaluation     Final   Value: THIS SPECIMEN IS ACCEPTABLE. RESPIRATORY CULTURE REPORT TO FOLLOW.   Report Status 04/26/2014 FINAL   Final  CULTURE, RESPIRATORY (NON-EXPECTORATED)     Status: None   Collection Time    04/26/14  7:32 AM      Result Value Ref Range Status   Specimen Description SPUTUM   Final   Special Requests NONE   Final   Gram  Stain     Final   Value: ABUNDANT WBC PRESENT,BOTH PMN AND MONONUCLEAR     NO SQUAMOUS EPITHELIAL CELLS SEEN     MODERATE GRAM POSITIVE COCCI IN PAIRS     Performed at [facility]   Culture     Final   Value: FEW CANDIDA ALBICANS     Performed at [facility]   Report Status 04/28/2014 FINAL   Final    Anti-infectives   Start     Dose/Rate Route Frequency Ordered Stop   04/27/14 1800  levofloxacin (LEVAQUIN) IVPB 750 mg  Status:  Discontinued     750 mg 100 mL/hr over 90 Minutes Intravenous Every 48 hours 04/25/14 1726 04/25/14 1730   04/27/14 1630  fluconazole (DIFLUCAN) tablet 100 mg     100 mg Oral Daily 04/27/14 1600     04/25/14 1730  piperacillin-tazobactam (ZOSYN) IVPB 3.375 g     3.375 g 12.5 mL/hr over 240 Minutes Intravenous 3 times per day 04/25/14 1643     04/25/14 1615  clindamycin (CLEOCIN) IVPB 600 mg  Status:  Discontinued     600 mg 100 mL/hr over 30 Minutes Intravenous  Once 04/25/14 1612 04/25/14 1728   04/25/14 1615  levofloxacin (LEVAQUIN) IVPB 750 mg  Status:  Discontinued     750 mg 100 mL/hr over 90 Minutes Intravenous  Once 04/25/14 1612 04/25/14 1730   04/25/14 1615  vancomycin (VANCOCIN) IVPB 1000 mg/200 mL premix  Status:  Discontinued     1,000 mg 200 mL/hr over 60 Minutes Intravenous  Once 04/25/14 1612 04/25/14 1934      Assessment: 80 y.o. year-old female with history of esophageal cancer diagnosed in 2012 treated with radiation, chemotherapy, and esophagogastrectomy currently in remission, chronic dysphasia to solids and liquids, chronic anemia who presents with weakness, CP, SOB. on broad spectrum abx for aspiration PNA/concern for pulmonary abscess and CAP  5/21 Clinda x1  5/21 Vanc x 1 5/21 >> Zosyn >>   WBC: 8.3 Temp: afebrile Renal: Scr = 0.91, CrCL = 17ml/min  5/21 Urine: NG 5/21 Blood x 2: NGTD 5/22 Sputum: few C. albicans 5/22 Strep pneumo urinary antigen: POSITIVE 5/22 Legionella urinary antigen:  pending  Goal of Therapy:  Dose appropriately for renal function  Plan:  Zosyn 3.375gm IV q8h over 4h infusion  Pharmacy to follow at [facility], Pharm[facility], [facility].   Pager: [phone]  04/28/2014,10:32 AM

## 2014-04-29 DIAGNOSIS — R110 Nausea: Secondary | ICD-10-CM

## 2014-04-29 LAB — CBC
HCT: 33.6 % — ABNORMAL LOW (ref 36.0–46.0)
Hemoglobin: 10.5 g/dL — ABNORMAL LOW (ref 12.0–15.0)
MCH: 28.9 pg (ref 26.0–34.0)
MCHC: 31.3 g/dL (ref 30.0–36.0)
MCV: 92.6 fL (ref 78.0–100.0)
Platelets: 321 10*3/uL (ref 150–400)
RBC: 3.63 MIL/uL — AB (ref 3.87–5.11)
RDW: 16.1 % — ABNORMAL HIGH (ref 11.5–15.5)
WBC: 6.4 10*3/uL (ref 4.0–10.5)

## 2014-04-29 LAB — BASIC METABOLIC PANEL
BUN: 9 mg/dL (ref 6–23)
CHLORIDE: 106 meq/L (ref 96–112)
CO2: 24 meq/L (ref 19–32)
Calcium: 9.3 mg/dL (ref 8.4–10.5)
Creatinine, Ser: 0.93 mg/dL (ref 0.50–1.10)
GFR calc Af Amer: 67 mL/min — ABNORMAL LOW (ref >90)
GFR, EST NON AFRICAN AMERICAN: 58 mL/min — AB (ref >90)
GLUCOSE: 93 mg/dL (ref 70–99)
Potassium: 5.2 mEq/L (ref 3.7–5.3)
SODIUM: 140 meq/L (ref 137–147)

## 2014-04-29 MED ORDER — DEXTROSE-NACL 5-0.45 % IV SOLN
INTRAVENOUS | Status: DC
  Administered 2014-04-29: 09:00:00 via INTRAVENOUS

## 2014-04-29 NOTE — Progress Notes (Signed)
Physical Therapy Treatment Patient Details Name: LASONYA HUBNER MRN: [id number] DOB: 1934-09-29 Today's Date: 04/29/2014    History of Present Illness 80 yo female admitted with asp pna. Hx of COPD, anemia, thoracic comp fx, HTN.     PT Comments    Slowly progressing activity level however pt required increased physical assistance from therapist this session. Dyspnea 3/4 with short ambulation distance and pt fatigued fairly easily. Concerned about pt's ability to function safely in home environment without assistance so discussed possible need for ST rehab placement. Pt states she is open to discuss options. At this time, recommend ST rehab at SNF to improve strength, activity tolerance, and overall functional mobility.   Follow Up Recommendations  SNF;Supervision/Assistance - 24 hour     Equipment Recommendations  None recommended by PT    Recommendations for Other Services OT consult     Precautions / Restrictions Precautions Precautions: Fall Restrictions Weight Bearing Restrictions: No    Mobility  Bed Mobility Overal bed mobility: Modified Independent       Supine to sit: HOB elevated;Modified independent (Device/Increase time) Sit to supine: HOB elevated;Modified independent (Device/Increase time)      Transfers Overall transfer level: Needs assistance   Transfers: Sit to/from Stand Sit to Stand: Min assist         General transfer comment: assist to rise, stabilize control descent  Ambulation/Gait Ambulation/Gait assistance: Min assist Ambulation Distance (Feet): 45 Feet Assistive device: None Gait Pattern/deviations: Decreased stride length;Decreased step length - left;Decreased step length - right     General Gait Details: unsteady. assist to stabilize throughout 2 brief standing rest breaks needed to complete task. dyspnea 3/4. Pt unable to tolerate any further activity.    Stairs            Wheelchair Mobility    Modified Rankin  (Stroke Patients Only)       Balance                                    Cognition Arousal/Alertness: Awake/alert Behavior During Therapy: WFL for tasks assessed/performed Overall Cognitive Status: Within Functional Limits for tasks assessed                      Exercises General Exercises - Lower Extremity Long Arc Quad: AROM;Both;10 reps;Seated    General Comments        Pertinent Vitals/Pain Chronic abdominal pain-unrated    Home Living                      Prior Function            PT Goals (current goals can now be found in the care plan section) Progress towards PT goals: Progressing toward goals (slowly with activity progression. However pt required increased assistance this session)    Frequency  Min 3X/week    PT Plan Discharge plan needs to be updated    Co-evaluation             End of Session Equipment Utilized During Treatment: Gait belt Activity Tolerance: Patient limited by fatigue Patient left: in bed;with call bell/phone within reach;with family/visitor present     Time: 1101-1111 PT Time Calculation (min): 10 min  Charges:  $Gait Training: 8-22 mins                    G Codes:  Weston Anna, MPT Pager: [phone]

## 2014-04-29 NOTE — Progress Notes (Signed)
CSW reviewed PT evaluation and noted that SNF was recommended. CSW spoke with patient who is agreeable with plan, states that she's been to [facility] SNF in the past but would prefer to return to [facility]. CSW confirmed with Bryson Ha @ [facility] that they will have a bed available when patient is ready.   Clinical Social Work Department CLINICAL SOCIAL WORK PLACEMENT NOTE 04/29/2014  Patient:  [name], [name]  Account Number:  [id number] Admit date:  04/25/2014  Clinical Social Worker:  Renold Genta  Date/time:  04/29/2014 03:31 PM  Clinical Social Work is seeking post-discharge placement for this patient at the following level of care:   SKILLED NURSING   (*CSW will update this form in Epic as items are completed)   04/29/2014  Patient/family provided with [place] Department of Clinical Social Work's list of facilities offering this level of care within the geographic area requested by the patient (or if unable, by the patient's family).  04/29/2014  Patient/family informed of their freedom to choose among providers that offer the needed level of care, that participate in Medicare, Medicaid or managed care program needed by the patient, have an available bed and are willing to accept the patient.  04/29/2014  Patient/family informed of [facility]' ownership interest in [facility], as well as of the fact that they are under no obligation to receive care at this facility.  PASARR submitted to EDS on 04/29/2014 PASARR number received from EDS on 04/29/2014  FL2 transmitted to all facilities in geographic area requested by pt/family on  04/29/2014 FL2 transmitted to all facilities within larger geographic area on   Patient informed that his/her managed care company has contracts with or will negotiate with  certain facilities, including the following:     Patient/family informed of bed offers received:   04/29/2014 Patient chooses bed at [facility] [place] [place] Physician recommends and patient chooses bed at    Patient to be transferred to [place] on   Patient to be transferred to facility by   The following physician request were entered in Epic:   Additional Comments:   Raynaldo Opitz, [place] Worker cell #: [phone]

## 2014-04-29 NOTE — Progress Notes (Signed)
[facility] PROGRESS NOTE  [name] SEG:[id number] DOB: 22-Jul-1934 DOA: 04/25/2014 PCP: Yolanda Miyamoto, MD  Assessment/Plan  Sepsis and acute hypoxic respiratory failure due to aspiration pneumonia with possible developing lung abscess.  Temperature and WBC trending down - Blood cultures NGTD - Sputum culture few candida - Legionella neg, s. pneumo ag positive - Continue Zosyn due to suspicion of mixed flora -  Anticipate transitioning to augmentin for long 4-6 week course with f/u CT in 2-4 weeks to follow possible lung abscess - Continuous pulse oximetry  - Continue florastor  Diarrhea, likely due to laxative + abx, but has hx of C. Diff -  C. Diff PCR -  Enteric precautions  Yeast infection -  Start fluconazole to continue until off antibiotics  Nausea and heaves -  Add prn phenergan -  Continue PPI -  Add GI cocktail -  Change to bariatric FLD -  Continue IVF  Headache -  Scheduled tylenol TID with ibuprofen prn  Dysphagia and odynophagia  - Appreciate speech therapy assistance - Esophogram:  Liquid does pass, although slowly from intrathoracic stomach to below diaphragm - Continue BID protonix  Epigastric pain, chronic  - Continue increased PPI  - Increase tramadol to TID - Try to minimize prn fentanyl as this may be contributing to nausea and vomiting - Tylenol as needed   SVT and PVC - Keep electrolytes wnl  - TSH 0.885 -  Patient recently had ECHO done which demonstrated normal structure and function 02/11/2014 -  Increase BB  -  Cardiology f/u  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 Benay Pitts two months ago  - Dr. Benay Pitts added to rounding list   Chronic steroid use due to arthritis. Low dose but could still be at risk for iatrogenic adrenal insufficiency. BP currently stable.   -  Continue 2mg  daily  Constipation, stable   COPD with increased SOB and diminshed breath sounds, but does not appear to have  COPD exacerbation  - Try to avoid systemic steroids - continue brovana and pulmicort  - Hold symbicort  - Albuterol four times daily  - Hold ipratropium because of hx of acute urinary retention. Can start if not responding to above treatments with close monitoring of urine output.   HTN, blood pressure improving -  Hold Norvasc and lisinopril  -  Continue BB  Leukocytosis due to pneumonia, improving - Trend WBC  Pernicious anemia, defer to outpatient management. Hgb trending down.  May be secondary to zosyn -  Trend hgb  Anxiety/depression, stable, continue ativan prn. St. John's Wort on hold  Hyponatremia, likely due to mild SIADH from pneumonia, resolved.  Diet: regular with thin Access: PIV  IVF: yes  Proph: lovenox   Code Status: Full  Family Communication: patient alone  Disposition Plan:  Likely to SNF for [name] term rehab  Consultants:  None  Procedures:  CT chest  Esophagram  Antibiotics:  Clindamycin 5/21 x 1  Vancomycin 5/21 x 1  Zosyn 5/21 >>   HPI/Subjective:  Feeling much better today.  Breathing still [name], coughing.  Nausea and abdominal pain are better today.  3 liquid stools since yesterday.    Objective: Filed Vitals:   04/28/14 2002 04/28/14 2030 04/29/14 0508 04/29/14 0759  BP:  133/86 115/94   Pulse:  72 73   Temp:  98.7 F (37.1 C) 98.5 F (36.9 C)   TempSrc:  Oral Oral   Resp:  18 18  Height:      Weight:      SpO2: 98% 98% 100% 93%    Intake/Output Summary (Last 24 hours) at 04/29/14 1109 Last data filed at 04/29/14 0900  Gross per 24 hour  Intake 1862.92 ml  Output    600 ml  Net 1262.92 ml   Filed Weights   04/25/14 1814  Weight: 63.5 kg (139 lb 15.9 oz)    Exam:   General:  Thin CF, NAD  HEENT:  NCAT, MMM  Cardiovascular:  RRR, nl S1, S2 no mrg, 2+ pulses, warm extremities  Respiratory:  Course rales at left base, no wheezes, + rhonchi, no increased WOB  Abdomen:   NABS, soft, soft large upper  abdomen hernia, mild TTP in epigastrium   MSK:   Normal tone and bulk, 1+ RLE edema  Neuro:  Grossly intact  Data Reviewed: Basic Metabolic Panel:  Recent Labs Lab 04/25/14 1405 04/26/14 0405 04/27/14 0503 04/28/14 0456 04/29/14 0345  NA 140 132* 135* 138 140  K 3.7 4.1 4.2 4.4 5.2  CL 102 97 101 103 106  CO2 22 21 24 24 24   GLUCOSE 110* 135* 109* 96 93  BUN 17 19 18 12 9   CREATININE 1.13* 1.16* 1.01 0.91 0.93  CALCIUM 9.6 8.9 9.4 9.5 9.3   Liver Function Tests:  Recent Labs Lab 04/25/14 1405  AST 16  ALT <5  ALKPHOS 72  BILITOT 0.3  PROT 7.4  ALBUMIN 3.6   No results found for this basename: LIPASE, AMYLASE,  in the last 168 hours No results found for this basename: AMMONIA,  in the last 168 hours CBC:  Recent Labs Lab 04/25/14 1405 04/26/14 0405 04/27/14 0503 04/28/14 0456 04/29/14 0345  WBC 16.0* 17.0* 14.4* 8.3 6.4  NEUTROABS 14.5*  --   --   --   --   HGB 11.8* 10.8* 9.8* 10.2* 10.5*  HCT 37.3 34.4* 31.5* 32.9* 33.6*  MCV 91.2 90.8 91.8 92.2 92.6  PLT 328 334 240 284 321   Cardiac Enzymes: No results found for this basename: CKTOTAL, CKMB, CKMBINDEX, TROPONINI,  in the last 168 hours BNP (last 3 results) No results found for this basename: PROBNP,  in the last 8760 hours CBG: No results found for this basename: GLUCAP,  in the last 168 hours  Recent Results (from the past 240 hour(s))  CULTURE, BLOOD (ROUTINE X 2)     Status: None   Collection Time    04/25/14  2:07 PM      Result Value Ref Range Status   Specimen Description BLOOD LEFT ARM   Final   Special Requests BOTTLES DRAWN AEROBIC AND ANAEROBIC 5ML   Final   Culture  Setup Time     Final   Value: 04/25/2014 21:51     Performed at [facility]   Culture     Final   Value:        BLOOD CULTURE RECEIVED NO GROWTH TO DATE CULTURE WILL BE HELD FOR 5 DAYS BEFORE ISSUING A FINAL NEGATIVE REPORT     Performed at [facility]   Report Status PENDING   Incomplete  CULTURE,  BLOOD (ROUTINE X 2)     Status: None   Collection Time    04/25/14  2:12 PM      Result Value Ref Range Status   Specimen Description BLOOD RIGHT ARM   Final   Special Requests BOTTLES DRAWN AEROBIC AND ANAEROBIC 5ML   Final  Culture  Setup Time     Final   Value: 04/25/2014 21:52     Performed at [facility]   Culture     Final   Value:        BLOOD CULTURE RECEIVED NO GROWTH TO DATE CULTURE WILL BE HELD FOR 5 DAYS BEFORE ISSUING A FINAL NEGATIVE REPORT     Performed at [facility]   Report Status PENDING   Incomplete  URINE CULTURE     Status: None   Collection Time    04/25/14  4:16 PM      Result Value Ref Range Status   Specimen Description URINE, CLEAN CATCH   Final   Special Requests NONE   Final   Culture  Setup Time     Final   Value: 04/26/2014 00:42     Performed at [facility] Count     Final   Value: NO GROWTH     Performed at [facility]   Culture     Final   Value: NO GROWTH     Performed at [facility]   Report Status 04/27/2014 FINAL   Final  CULTURE, EXPECTORATED SPUTUM-ASSESSMENT     Status: None   Collection Time    04/26/14  7:32 AM      Result Value Ref Range Status   Specimen Description SPUTUM   Final   Special Requests Immunocompromised   Final   Sputum evaluation     Final   Value: THIS SPECIMEN IS ACCEPTABLE. RESPIRATORY CULTURE REPORT TO FOLLOW.   Report Status 04/26/2014 FINAL   Final  CULTURE, RESPIRATORY (NON-EXPECTORATED)     Status: None   Collection Time    04/26/14  7:32 AM      Result Value Ref Range Status   Specimen Description SPUTUM   Final   Special Requests NONE   Final   Gram Stain     Final   Value: ABUNDANT WBC PRESENT,BOTH PMN AND MONONUCLEAR     NO SQUAMOUS EPITHELIAL CELLS SEEN     MODERATE GRAM POSITIVE COCCI IN PAIRS     Performed at [facility]   Culture     Final   Value: FEW CANDIDA ALBICANS     Performed at [facility]   Report Status  04/28/2014 FINAL   Final     Studies: No results found.  Scheduled Meds: . acetaminophen (TYLENOL) oral liquid 160 mg/5 mL  650 mg Oral TID  . albuterol  2.5 mg Nebulization TID  . antiseptic oral rinse  15 mL Mouth Rinse BID  . arformoterol  15 mcg Nebulization BID  . atenolol  50 mg Oral BID  . budesonide (PULMICORT) nebulizer solution  0.25 mg Nebulization BID  . enoxaparin (LOVENOX) injection  40 mg Subcutaneous Q24H  . fluconazole  100 mg Oral Daily  . ibuprofen  600 mg Oral Once  . loratadine  10 mg Oral Daily  . pantoprazole  40 mg Oral BID AC  . piperacillin-tazobactam (ZOSYN)  IV  3.375 g Intravenous 3 times per day  . predniSONE  2 mg Oral Q breakfast  . raloxifene  60 mg Oral q morning - 10a  . saccharomyces boulardii  250 mg Oral BID  . traMADol  100 mg Oral TID  . Vitamin D (Ergocalciferol)  50,000 Units Oral Q21 days   Continuous Infusions: . dextrose 5 % and 0.45% NaCl 50 mL/hr at 04/29/14 0830  Principal Problem:   Aspiration pneumonia Active Problems:   Pernicious anemia   Arthritis   Adenocarcinoma of gastroesophageal junction   Hypertension   Degenerative arthritis of hip   Compression fracture of thoracic vertebra   CAP (community acquired pneumonia)   Dysphagia   Odynophagia   Globus sensation   COPD (chronic obstructive pulmonary disease)   Leukocytosis   Sepsis   Acute respiratory failure with hypoxia   Nausea alone   Abdominal pain, epigastric    Time spent: 30 min    [facility] Pager [phone]. If 7PM-7AM, please contact night-coverage at [email or web address], password [facility] 04/29/2014, 11:09 AM  LOS: 4 days

## 2014-04-30 DIAGNOSIS — R197 Diarrhea, unspecified: Secondary | ICD-10-CM

## 2014-04-30 LAB — CBC
HCT: 37 % (ref 36.0–46.0)
Hemoglobin: 11.5 g/dL — ABNORMAL LOW (ref 12.0–15.0)
MCH: 28.7 pg (ref 26.0–34.0)
MCHC: 31.1 g/dL (ref 30.0–36.0)
MCV: 92.3 fL (ref 78.0–100.0)
Platelets: 349 10*3/uL (ref 150–400)
RBC: 4.01 MIL/uL (ref 3.87–5.11)
RDW: 15.9 % — ABNORMAL HIGH (ref 11.5–15.5)
WBC: 8.1 10*3/uL (ref 4.0–10.5)

## 2014-04-30 LAB — BASIC METABOLIC PANEL
BUN: 9 mg/dL (ref 6–23)
CHLORIDE: 104 meq/L (ref 96–112)
CO2: 24 meq/L (ref 19–32)
CREATININE: 0.96 mg/dL (ref 0.50–1.10)
Calcium: 9.8 mg/dL (ref 8.4–10.5)
GFR calc Af Amer: 64 mL/min — ABNORMAL LOW (ref >90)
GFR calc non Af Amer: 56 mL/min — ABNORMAL LOW (ref >90)
Glucose, Bld: 89 mg/dL (ref 70–99)
Potassium: 4 mEq/L (ref 3.7–5.3)
Sodium: 140 mEq/L (ref 137–147)

## 2014-04-30 LAB — LEGIONELLA ANTIGEN, URINE: LEGIONELLA ANTIGEN, URINE: NEGATIVE

## 2014-04-30 LAB — CLOSTRIDIUM DIFFICILE BY PCR: CDIFFPCR: NEGATIVE

## 2014-04-30 MED ORDER — WHITE PETROLATUM GEL: 1.0000 "application " | Status: DC | PRN

## 2014-04-30 MED ORDER — GUAIFENESIN-DM 100-10 MG/5ML PO SYRP: 10.0000 mL | ORAL_SOLUTION | ORAL | Status: DC | PRN

## 2014-04-30 MED ORDER — ACETAMINOPHEN 160 MG/5ML PO SOLN: 650.0000 mg | Freq: Four times a day (QID) | ORAL | Status: DC | PRN

## 2014-04-30 MED ORDER — SALINE SPRAY 0.65 % NA SOLN
1.0000 | NASAL | Status: DC | PRN
  Filled 2014-04-30: qty 44

## 2014-04-30 MED ORDER — WHITE PETROLATUM GEL
Status: DC | PRN
  Filled 2014-04-30: qty 5

## 2014-04-30 MED ORDER — LORAZEPAM 0.5 MG PO TABS: 0.5000 mg | ORAL_TABLET | Freq: Three times a day (TID) | ORAL | Status: DC | PRN

## 2014-04-30 MED ORDER — AMOXICILLIN-POT CLAVULANATE 875-125 MG PO TABS: 1.0000 | ORAL_TABLET | Freq: Two times a day (BID) | ORAL | Status: DC

## 2014-04-30 MED ORDER — SACCHAROMYCES BOULARDII 250 MG PO CAPS: 250.0000 mg | ORAL_CAPSULE | Freq: Two times a day (BID) | ORAL | Status: DC

## 2014-04-30 MED ORDER — DIPHENOXYLATE-ATROPINE 2.5-0.025 MG PO TABS: 1.0000 | ORAL_TABLET | Freq: Four times a day (QID) | ORAL | Status: DC

## 2014-04-30 MED ORDER — TRAMADOL HCL 50 MG PO TABS: 100.0000 mg | ORAL_TABLET | Freq: Three times a day (TID) | ORAL | Status: DC

## 2014-04-30 MED ORDER — IBUPROFEN 100 MG/5ML PO SUSP: 400.0000 mg | Freq: Four times a day (QID) | ORAL | Status: DC | PRN

## 2014-04-30 MED ORDER — DIMENHYDRINATE 50 MG PO TABS: 25.0000 mg | ORAL_TABLET | Freq: Four times a day (QID) | ORAL | Status: DC | PRN

## 2014-04-30 MED ORDER — DIPHENOXYLATE-ATROPINE 2.5-0.025 MG PO TABS
1.0000 | ORAL_TABLET | Freq: Four times a day (QID) | ORAL | Status: DC
  Administered 2014-04-30 – 2014-05-02 (×9): 1 via ORAL
  Filled 2014-04-30 (×9): qty 1

## 2014-04-30 MED ORDER — FLUCONAZOLE 100 MG PO TABS: 100.0000 mg | ORAL_TABLET | Freq: Every day | ORAL | Status: DC

## 2014-04-30 MED ORDER — SALINE SPRAY 0.65 % NA SOLN: 1.0000 | NASAL | Status: DC | PRN

## 2014-04-30 NOTE — Discharge Summary (Signed)
Physician Discharge Summary  [name]:[id number] DOB: 10-26-1934 DOA: 04/25/2014  PCP: Yolanda Miyamoto, MD  Admit date: 04/25/2014 Discharge date:  pending  Recommendations for Outpatient Follow-up:  Transfer to SNF for ongoing rehabilitation PT/OT.   F/u pending blood cultures.   Cardiology in two month Doctor [name] in 2-3 weeks.   Please repeat CT of the chest in 2-3 weeks prior to discontinuing antibiotics. Augmentin and fluconazole through 6/17, then stop (unless evidence of persistent abscess on CT)  Discharge Diagnoses:  Principal Problem:   Aspiration pneumonia Active Problems:   Pernicious anemia   Arthritis   Adenocarcinoma of gastroesophageal junction   Hypertension   Degenerative arthritis of hip   Compression fracture of thoracic vertebra   CAP (community acquired pneumonia)   Dysphagia   Odynophagia   Globus sensation   COPD (chronic obstructive pulmonary disease)   Leukocytosis   Sepsis   Acute respiratory failure with hypoxia   Nausea alone   Abdominal pain, epigastric   Discharge Condition: stable, improved  Diet recommendation: regular with thin liquids  Wt Readings from Last 3 Encounters:  04/25/14 63.5 kg (139 lb 15.9 oz)  02/19/14 62.914 kg (138 lb 11.2 oz)  09/20/13 64.411 kg (142 lb)    History of present illness:  The patient is a 80 y.o. year-old female with history of adenocarcinoma of the GE junction diagnosed in 2012 treated with radiation, chemotherapy, and esophagogastrectomy currently in remission, chronic dysphagia to solids and liquids, COPD, chronic anemia who presents with weakness, HTN. She states that she has not felt well since her cancer treatments a few years ago, but she has felt more fatigued over last few weeks. She has chronic cough and SOB attributed to her COPD. Yesterday, she was cleaning the house when she suddenly became more SOB and wheezy. Last night, she developed some palpitations. She has chronic nausea  and epigastric pain 7-8/10 that occasionally possibly radiates to her back. She has sensation of food and liquid getting stuck and sitting in her chest for long periods of time which is worse recently she thinks. She presented to the ER today via EMS because of severe SOB.   In ER, she was 86% on 2L and up to mid-90s on 3L. Febrile to 100.11F with HR in the 90s. WBC 16K. CT angio chest demonstrated extensive left lower lobe consolidation and possible lung abscess. She was started on clindamycin by the ER for aspiration pneumonia.   Hospital Course:   Sepsis and acute hypoxic respiratory failure due to aspiration pneumonia with possible developing lung abscess.  She was started on zosyn due to concern for mixed flora, although her S. Pneumonia antigen was positive.  Her tmperature and WBC trended down.  Legionella ag was negative.  Blood cultures NGTD.  Sputum culture few candida.   Continue augmentin for 4-week course with f/u CT chest prior to discontinuing antibiotics.  Continue florastor.     Diarrhea, likely due to laxative + abx, C. Diff PCR negative.  Continue florastor.  If diarrhea increases, would repeat C. Diff PCR.  Start scheduled lomotil.    Yeast infection, continue fluconazole until off antibiotics.  Nausea and heaves, resolved with PPI and keeping head of bed elevated at night.    Headache, likely related to sepsis and improved with tylenol and ibuprofen.  Dysphagia and odynophagia, seen by speech therapy who agreed she is likely aspirating because of slow movement of food from thorax to abdomen, but okay for regular diet with  thin liquids.  Continue protonix BID.  Epigastric pain, chronic.  Increased tramadol to TID.  PPI.    SVT and PVCs on telemetry, kept electrolytes wnl.  TSH 0.885.  Patient recently had ECHO done which demonstrated normal structure and function 02/11/2014.  Continue beta blocker.  Cardiology follow up in 1 month or sooner as needed.    AdenoCA at the GE  junction, in remission per last oncology note by Dr. Benay Pitts two months ago.    Chronic steroid use due to arthritis. Low dose but could still be at risk for iatrogenic adrenal insufficiency. BP currently stable. Continue prednisone 2mg  daily   Constipation, stable   COPD with increased SOB and diminshed breath sounds, but does not appear to have COPD exacerbation.  She was not started on systemic steroids. She used brovana and pulmicort instead of symbicort when she was initially admitted bc her degree of dyspnea limited her ability to use the inhaler.  She may resume her inhaler at discharge.    HTN, blood pressure improved with antibiotics and IVF.  She may resume her home blood pressure medications which were initially held due to hypotension.  Leukocytosis due to pneumonia, resolved with antibiotics.  Pernicious anemia, defer to outpatient management. Hgb stable near 11 mg/dL.    Anxiety/depression, stable, continue ativan prn. [facility] John's Wort on hold   Hyponatremia, likely due to mild SIADH from pneumonia and dehydration, resolved.   Consultants:  None Procedures:  CT chest  Esophagram Antibiotics:  Clindamycin 5/21 x 1  Vancomycin 5/21 x 1  Zosyn 5/21 >>    Discharge Exam: Filed Vitals:   04/30/14 0438  BP: 153/77  Pulse: 86  Temp: 98 F (36.7 C)  Resp: 18   Filed Vitals:   04/29/14 1310 04/29/14 1941 04/29/14 2137 04/30/14 0438  BP: 150/77  159/86 153/77  Pulse: 74  81 86  Temp: 97.8 F (36.6 C)  97.8 F (36.6 C) 98 F (36.7 C)  TempSrc: Oral  Oral Oral  Resp: 16  18 18   Height:      Weight:      SpO2: 94% 96% 98% 98%    General: Thin CF, NAD  HEENT: NCAT, MMM  Cardiovascular: RRR, nl S1, S2 no mrg, 2+ pulses, warm extremities  Respiratory: Course rales at left base, no wheezes, + rhonchi, no increased WOB  Abdomen: NABS, soft, soft large upper abdomen hernia, mild TTP in epigastrium without rebound or guarding MSK: Normal tone and bulk, 1+ RLE  edema  Neuro: Grossly intact   Discharge Instructions      Discharge Instructions   Call MD for:  difficulty breathing, headache or visual disturbances    Complete by:  As directed      Call MD for:  extreme fatigue    Complete by:  As directed      Call MD for:  hives    Complete by:  As directed      Call MD for:  persistant dizziness or light-headedness    Complete by:  As directed      Call MD for:  persistant nausea and vomiting    Complete by:  As directed      Call MD for:  severe uncontrolled pain    Complete by:  As directed      Call MD for:  temperature >100.4    Complete by:  As directed      Diet general    Complete by:  As directed  Increase activity slowly    Complete by:  As directed             Medication List    STOP taking these medications       acetaminophen 500 MG tablet  Commonly known as:  TYLENOL  Replaced by:  acetaminophen 160 MG/5ML solution     amLODipine 2.5 MG tablet  Commonly known as:  NORVASC     amoxicillin 500 MG capsule  Commonly known as:  AMOXIL      TAKE these medications       acetaminophen 160 MG/5ML solution  Commonly known as:  TYLENOL  Take 20.3 mLs (650 mg total) by mouth every 6 (six) hours as needed.     acetic acid-hydrocortisone otic solution  Commonly known as:  VOSOL-HC  Place 1 drop into both ears 3 (three) times daily as needed (pain and itching).     albuterol 108 (90 BASE) MCG/ACT inhaler  Commonly known as:  PROVENTIL HFA;VENTOLIN HFA  Inhale 2 puffs into the lungs every 6 (six) hours as needed for wheezing (wheezing).     amoxicillin-clavulanate 875-125 MG per tablet  Commonly known as:  AUGMENTIN  Take 1 tablet by mouth 2 (two) times daily.     aspirin EC 81 MG tablet  Take 81 mg by mouth daily.     atenolol 50 MG tablet  Commonly known as:  TENORMIN  Take 50 mg by mouth 2 (two) times daily.     budesonide-formoterol 160-4.5 MCG/ACT inhaler  Commonly known as:  SYMBICORT  Inhale 2  puffs into the lungs 2 (two) times daily.     CALCIUM-VITAMIN D-VITAMIN K PO  Take 1 tablet by mouth 2 (two) times daily.     dimenhyDRINATE 50 MG tablet  Commonly known as:  DRAMAMINE  Take 0.5 tablets (25 mg total) by mouth every 6 (six) hours as needed for itching or nausea.     diphenoxylate-atropine 2.5-0.025 MG per tablet  Commonly known as:  LOMOTIL  Take 1 tablet by mouth 4 (four) times daily.     esomeprazole 20 MG capsule  Commonly known as:  NEXIUM  Take 20 mg by mouth daily at 12 noon.     fluconazole 100 MG tablet  Commonly known as:  DIFLUCAN  Take 1 tablet (100 mg total) by mouth daily.     furosemide 20 MG tablet  Commonly known as:  LASIX  Take 20 mg by mouth daily as needed for fluid.     guaiFENesin-dextromethorphan 100-10 MG/5ML syrup  Commonly known as:  ROBITUSSIN DM  Take 10 mLs by mouth every 4 (four) hours as needed for cough.     hydroxypropyl methylcellulose 2.5 % ophthalmic solution  Commonly known as:  ISOPTO TEARS  Place 1 drop into both eyes 3 (three) times daily as needed for dry eyes.     ibuprofen 100 MG/5ML suspension  Commonly known as:  ADVIL,MOTRIN  Take 20 mLs (400 mg total) by mouth every 6 (six) hours as needed for fever, mild pain or moderate pain (headache).     lisinopril 40 MG tablet  Commonly known as:  PRINIVIL,ZESTRIL  Take 40 mg by mouth every morning.     loratadine 10 MG tablet  Commonly known as:  CLARITIN  Take 10 mg by mouth daily.     LORazepam 0.5 MG tablet  Commonly known as:  ATIVAN  Take 1 tablet (0.5 mg total) by mouth every 8 (eight) hours as needed. Takes as  needed for anxiety     predniSONE 1 MG tablet  Commonly known as:  DELTASONE  Take 2 mg by mouth daily with breakfast. Takes 2 tablets daily     raloxifene 60 MG tablet  Commonly known as:  EVISTA  Take 60 mg by mouth every morning.     saccharomyces boulardii 250 MG capsule  Commonly known as:  FLORASTOR  Take 1 capsule (250 mg total) by  mouth 2 (two) times daily.     sodium chloride 0.65 % Soln nasal spray  Commonly known as:  OCEAN  Place 1 spray into both nostrils as needed for congestion.     [facility] JOHNS WORT PO  Take 1 capsule by mouth 2 (two) times daily.     tiZANidine 4 MG tablet  Commonly known as:  ZANAFLEX  Take 2-4 mg by mouth every 8 (eight) hours as needed for muscle spasms. TAKES 1/2 TABLET-MUSCLE STIFFNESS     traMADol 50 MG tablet  Commonly known as:  ULTRAM  Take 2 tablets (100 mg total) by mouth 3 (three) times daily.     Vitamin D3 50000 UNITS Caps  Take 1 capsule by mouth every 21 ( twenty-one) days.     white petrolatum Gel  Commonly known as:  VASELINE  Apply 1 application topically as needed (anterior nares to prevent nose bleed).       Follow-up Information   Follow up with FRIED, ROBERT L, MD. Schedule an appointment as soon as possible for a visit in 1 month.   Specialty:  Family Medicine   Contact information:   [address] [place] [address] [phone]       Follow up with Thornton Papas, MD In 2 weeks.   Specialty:  Oncology   Contact information:   [address] [place] [address] [phone]       Follow up with [facility] Main [facility]). Schedule an appointment as soon as possible for a visit in 2 months.   Specialty:  Cardiology   Contact information:   [address], [address] [place] [address] [phone]       The results of significant diagnostics from this hospitalization (including imaging, microbiology, ancillary and laboratory) are listed below for reference.    Significant Diagnostic Studies: Ct Angio Chest Pe W/cm &/or Wo Cm  04/25/2014   EXAM: CT ANGIOGRAPHY CHEST WITH CONTRAST  TECHNIQUE: Multidetector CT imaging of the chest was performed using the standard protocol during bolus administration of intravenous contrast. Multiplanar CT image reconstructions and MIPs were obtained to evaluate the vascular anatomy.   CONTRAST:  OMNIPAQUE IOHEXOL 350 MG/ML SOLN  COMPARISON:  12/12/2012.  FINDINGS: No evidence for pulmonary emboli. Atheromatous change transverse arch. Sequelae of gastric pull-through, with dilated fluid-filled gastric pull up.  Atherosclerotic changes greatest at the descending thoracic cord are. Coronary artery calcifications with cardiomegaly. Slight pericardial effusion.  Moderate bilateral pleural effusions. COPD. Tiny bilateral pulmonary nodules) image 39, 41) noted previously not calcified.  Extensive left lower lobe consolidation involving the superior segment and posterior basal segment could represent aspiration. Air-fluid level as seen on image 29 series 7 could represent pulmonary abscess versus layering fluid within an air cyst. Minor infiltrates or atelectatic changes right base.  Chronic compression deformity T5. Treated vertebral body compression fractures with vertebral augmentation at T6, T8, and T9. No worrisome osseous lesions.  Upper abdominal organs unremarkable.  Review of the MIP images confirms the above findings.  IMPRESSION: Extensive left lower lobe consolidation involving superior segment and posterior basal segments could represent aspiration pneumonia in this patient with a gastric pull-through and a dilated fluid-filled gastric pull up.  Air-fluid level could represent pulmonary abscess versus layering fluid within air cyst.  Bilateral pleural effusions.  No evidence for pulmonary emboli.   Electronically Signed   By: Rolla Flatten M.D.   On: 04/25/2014 15:42   Dg Esophagus  04/26/2014   CLINICAL DATA:  80 year old female with history of esophageal cancer, partial esophagectomy and gastric pull-through. Also treated with radiation. Admitted with pneumonia, difficulty swallowing, coughing, and choking. Initial encounter.  EXAM: ESOPHOGRAM/BARIUM SWALLOW  TECHNIQUE: Single contrast examination was performed using  barium.  FLUOROSCOPY TIME:  1 min 38 seconds.  COMPARISON:   Esophagram 12/27/2011. Chest and abdomen CT 12/12/2012.  FINDINGS: Due to limited mobility, the study was performed with the fluoroscopy table inclined at 45 degrees. A single contrast study was performed.  The patient tolerated PO barium well and without difficulty.  The patient did cough after some swallows of barium, but no definite aspiration was identified.  Sequelae of partial esophagectomy and gastric pull-through are noted. No obstruction to the Ford flow of barium into the gastric pull-through. Barium was slow to empty from the intra thoracic stomach into the abdomen, but at the conclusion of the study about half of the ingested barium had reached the duodenum C loop.  No anastomotic stricture identified.  No extravasation.  Numerous surgical clips in the mediastinum. Postprocedural abdominal film demonstrating extensive flank soft tissue calcifications, and previous bilateral hip arthroplasty.  IMPRESSION: 1. Satisfactory swallow study in this patient who is status post subtotal esophagectomy and gastric pull-through. 2. Some coughing was noted with swallowing during this study, raising the possibility of aspiration, but no definite aspiration was identified. A followup modified barium swallow would be more sensitive.   Electronically Signed   By: Lars Pinks M.D.   On: 04/26/2014 11:51   Dg Chest Portable 1 View  04/25/2014   CLINICAL DATA:  Weakness and shortness of breath.  COPD.  EXAM: PORTABLE CHEST - 1 VIEW  COMPARISON:  02/07/2014.  FINDINGS: Cardiomegaly persists. Calcified tortuous aorta. Mild vascular congestion. Slight left pleural effusion. Osteopenia.  IMPRESSION: Cardiomegaly with mild vascular congestion. Small left effusion. Worsening aeration from priors.   Electronically Signed   By: Rolla Flatten M.D.   On: 04/25/2014 14:17    Microbiology: Recent Results (from the past 240 hour(s))  CULTURE, BLOOD (ROUTINE X 2)     Status: None   Collection Time    04/25/14  2:07 PM      Result  Value Ref Range Status   Specimen Description BLOOD LEFT ARM   Final   Special Requests BOTTLES DRAWN AEROBIC AND ANAEROBIC 5ML   Final   Culture  Setup Time     Final   Value: 04/25/2014 21:51     Performed at [facility]   Culture     Final   Value:        BLOOD CULTURE RECEIVED NO GROWTH TO DATE CULTURE WILL BE HELD FOR 5 DAYS BEFORE ISSUING A FINAL NEGATIVE REPORT     Performed at [facility]   Report Status PENDING   Incomplete  CULTURE, BLOOD (ROUTINE X 2)     Status: None   Collection Time    04/25/14  2:12 PM      Result Value Ref Range Status   Specimen Description BLOOD  RIGHT ARM   Final   Special Requests BOTTLES DRAWN AEROBIC AND ANAEROBIC 5ML   Final   Culture  Setup Time     Final   Value: 04/25/2014 21:52     Performed at [facility]   Culture     Final   Value:        BLOOD CULTURE RECEIVED NO GROWTH TO DATE CULTURE WILL BE HELD FOR 5 DAYS BEFORE ISSUING A FINAL NEGATIVE REPORT     Performed at [facility]   Report Status PENDING   Incomplete  URINE CULTURE     Status: None   Collection Time    04/25/14  4:16 PM      Result Value Ref Range Status   Specimen Description URINE, CLEAN CATCH   Final   Special Requests NONE   Final   Culture  Setup Time     Final   Value: 04/26/2014 00:42     Performed at [facility] Count     Final   Value: NO GROWTH     Performed at [facility]   Culture     Final   Value: NO GROWTH     Performed at [facility]   Report Status 04/27/2014 FINAL   Final  CULTURE, EXPECTORATED SPUTUM-ASSESSMENT     Status: None   Collection Time    04/26/14  7:32 AM      Result Value Ref Range Status   Specimen Description SPUTUM   Final   Special Requests Immunocompromised   Final   Sputum evaluation     Final   Value: THIS SPECIMEN IS ACCEPTABLE. RESPIRATORY CULTURE REPORT TO FOLLOW.   Report Status 04/26/2014 FINAL   Final  CULTURE, RESPIRATORY (NON-EXPECTORATED)      Status: None   Collection Time    04/26/14  7:32 AM      Result Value Ref Range Status   Specimen Description SPUTUM   Final   Special Requests NONE   Final   Gram Stain     Final   Value: ABUNDANT WBC PRESENT,BOTH PMN AND MONONUCLEAR     NO SQUAMOUS EPITHELIAL CELLS SEEN     MODERATE GRAM POSITIVE COCCI IN PAIRS     Performed at [facility]   Culture     Final   Value: FEW CANDIDA ALBICANS     Performed at [facility]   Report Status 04/28/2014 FINAL   Final  CLOSTRIDIUM DIFFICILE BY PCR     Status: None   Collection Time    04/29/14  9:37 PM      Result Value Ref Range Status   C difficile by pcr NEGATIVE  NEGATIVE Final   Comment: Performed at [facility]: Basic Metabolic Panel:  Recent Labs Lab 04/26/14 0405 04/27/14 0503 04/28/14 0456 04/29/14 0345 04/30/14 0345  NA 132* 135* 138 140 140  K 4.1 4.2 4.4 5.2 4.0  CL 97 101 103 106 104  CO2 21 24 24 24 24   GLUCOSE 135* 109* 96 93 89  BUN 19 18 12 9 9   CREATININE 1.16* 1.01 0.91 0.93 0.96  CALCIUM 8.9 9.4 9.5 9.3 9.8   Liver Function Tests:  Recent Labs Lab 04/25/14 1405  AST 16  ALT <5  ALKPHOS 72  BILITOT 0.3  PROT 7.4  ALBUMIN 3.6   No results found for this basename: LIPASE, AMYLASE,  in the last 168  hours No results found for this basename: AMMONIA,  in the last 168 hours CBC:  Recent Labs Lab 04/25/14 1405 04/26/14 0405 04/27/14 0503 04/28/14 0456 04/29/14 0345 04/30/14 0345  WBC 16.0* 17.0* 14.4* 8.3 6.4 8.1  NEUTROABS 14.5*  --   --   --   --   --   HGB 11.8* 10.8* 9.8* 10.2* 10.5* 11.5*  HCT 37.3 34.4* 31.5* 32.9* 33.6* 37.0  MCV 91.2 90.8 91.8 92.2 92.6 92.3  PLT 328 334 240 284 321 349   Cardiac Enzymes: No results found for this basename: CKTOTAL, CKMB, CKMBINDEX, TROPONINI,  in the last 168 hours BNP: BNP (last 3 results) No results found for this basename: PROBNP,  in the last 8760 hours CBG: No results found for this basename: GLUCAP,  in  the last 168 hours  Time coordinating discharge: 45 minutes  Signed:  Janece Canterbury  [facility] 04/30/2014, 12:09 PM

## 2014-04-30 NOTE — Progress Notes (Signed)
PT Cancellation Note  Patient Details Name: [name] MRN: [id number] DOB: Jan 23, 1934   Cancelled Treatment:    Reason Eval/Treat Not Completed: pt declined to participate at this time. Will check back later if possible/schedule permits. Otherwise, will check back another day.    Weston Anna, MPT Pager: [phone]

## 2014-05-01 ENCOUNTER — Inpatient Hospital Stay (HOSPITAL_COMMUNITY): Payer: Medicare Other

## 2014-05-01 DIAGNOSIS — R197 Diarrhea, unspecified: Secondary | ICD-10-CM

## 2014-05-01 LAB — BASIC METABOLIC PANEL
BUN: 9 mg/dL (ref 6–23)
CHLORIDE: 104 meq/L (ref 96–112)
CO2: 23 meq/L (ref 19–32)
CREATININE: 1.03 mg/dL (ref 0.50–1.10)
Calcium: 9.4 mg/dL (ref 8.4–10.5)
GFR calc Af Amer: 59 mL/min — ABNORMAL LOW (ref >90)
GFR calc non Af Amer: 51 mL/min — ABNORMAL LOW (ref >90)
Glucose, Bld: 93 mg/dL (ref 70–99)
Potassium: 4.1 mEq/L (ref 3.7–5.3)
Sodium: 140 mEq/L (ref 137–147)

## 2014-05-01 LAB — CBC
HCT: 35.2 % — ABNORMAL LOW (ref 36.0–46.0)
HEMOGLOBIN: 10.9 g/dL — AB (ref 12.0–15.0)
MCH: 28.2 pg (ref 26.0–34.0)
MCHC: 31 g/dL (ref 30.0–36.0)
MCV: 91.2 fL (ref 78.0–100.0)
Platelets: 317 10*3/uL (ref 150–400)
RBC: 3.86 MIL/uL — AB (ref 3.87–5.11)
RDW: 16 % — ABNORMAL HIGH (ref 11.5–15.5)
WBC: 8.4 10*3/uL (ref 4.0–10.5)

## 2014-05-01 LAB — CULTURE, BLOOD (ROUTINE X 2): Culture: NO GROWTH

## 2014-05-01 MED ORDER — ALBUTEROL SULFATE (2.5 MG/3ML) 0.083% IN NEBU
2.5000 mg | INHALATION_SOLUTION | Freq: Two times a day (BID) | RESPIRATORY_TRACT | Status: DC
Start: 2014-05-01 — End: 2014-05-02
  Administered 2014-05-01 – 2014-05-02 (×2): 2.5 mg via RESPIRATORY_TRACT
  Filled 2014-05-01 (×2): qty 3

## 2014-05-01 NOTE — Progress Notes (Signed)
[facility] PROGRESS NOTE  [name] YQM:[id number] DOB: 1934-07-03 DOA: 04/25/2014 PCP: [name], MD  Assessment/Plan  Sepsis and acute hypoxic respiratory failure due to aspiration pneumonia with possible developing lung abscess.  Temperature and WBC trending down - Blood cultures 1/2 positive for Gram neg rods- ? Contaminant- FU final results. - Sputum culture few candida - Legionella neg, s. pneumo ag positive - Continue Zosyn due to suspicion of mixed flora -  Anticipate transitioning to augmentin for long 4-6 week course with f/u CT in 2-4 weeks to follow possible lung abscess - Continuous pulse oximetry  - Continue florastor - Chest x-ray improving.  Diarrhea, likely due to laxative + abx, but has hx of C. Diff -  C. Diff PCR negative - still has loose stools- likely 2/2 Abx. Consider Imodium  Yeast infection -  Start fluconazole to continue until off antibiotics  Nausea and heaves -  Seems to have resolved  Headache -  Scheduled tylenol TID with ibuprofen prn  Dysphagia and odynophagia  - Appreciate speech therapy assistance - Esophogram:  Liquid does pass, although slowly from intrathoracic stomach to below diaphragm - Continue BID protonix  Epigastric pain, chronic  - Continue increased PPI  - Increase tramadol to TID - Try to minimize prn fentanyl as this may be contributing to nausea and vomiting - Tylenol as needed   SVT and PVC - Keep electrolytes wnl  - TSH 0.885 -  Patient recently had ECHO done which demonstrated normal structure and function 02/11/2014 -  Increase BB  -  Cardiology f/u  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 [name] two months ago  - Dr. [name] added to rounding list  - OP FU with Oncology  Chronic steroid use due to arthritis. Low dose but could still be at risk for iatrogenic adrenal insufficiency. BP currently stable.   -  Continue 2mg  daily  Constipation, stable   COPD  with increased SOB and diminshed breath sounds, but does not appear to have COPD exacerbation  - Try to avoid systemic steroids - continue brovana and pulmicort  - Hold symbicort  - Albuterol four times daily  - Hold ipratropium because of hx of acute urinary retention. Can start if not responding to above treatments with close monitoring of urine output.   HTN, blood pressure improving -  Hold Norvasc and lisinopril  -  Continue BB  Leukocytosis due to pneumonia, improving - Trend WBC  Pernicious anemia, defer to outpatient management. Hgb trending down.  May be secondary to zosyn -  Trend hgb  Anxiety/depression, stable, continue ativan prn. St. John's Wort on hold  Hyponatremia, likely due to mild SIADH from pneumonia, resolved.  Bacteremia versus contamination 1 of 2 blood cultures positive for gram-negative rods.? Diphtheroids. Followup final culture results. Currently on Zosyn which should cover.  Diet: regular with thin Access: PIV  Proph: lovenox   Code Status: Full  Family Communication: patient alone. No family at bedside. Disposition Plan:  Likely to SNF for short term rehab  Consultants:  None  Procedures:  CT chest  Esophagram  Antibiotics:  Clindamycin 5/21 x 1  Vancomycin 5/21 x 1  Zosyn 5/21 >>   HPI/Subjective:  Generally weak. No SOB. Mild cough with white sputum. 3 loose BMs in the last 24 hours.  Objective: Filed Vitals:   04/30/14 2209 05/01/14 0525 05/01/14 0754 05/01/14 1418  BP: 140/69 130/65  143/84  Pulse: 96 86  98  Temp: 98.4 F (36.9 C) 98.2 F (36.8 C)  98.3 F (36.8 C)  TempSrc: Oral Oral  Oral  Resp: 18 16  18   Height:      Weight:      SpO2: 94% 98% 92% 94%    Intake/Output Summary (Last 24 hours) at 05/01/14 1424 Last data filed at 05/01/14 1300  Gross per 24 hour  Intake    820 ml  Output    650 ml  Net    170 ml   Filed Weights   04/25/14 1814  Weight: 63.5 kg (139 lb 15.9 oz)    Exam:   General:   Thin CF, NAD  HEENT:  NCAT, MMM  Cardiovascular:  RRR, nl S1, S2 no mrg, 2+ pulses, warm extremities  Respiratory:  Course rales at left base, no wheezes, + rhonchi, no increased WOB  Abdomen:   NABS, soft, soft large upper abdomen hernia, no peritoneal signs. Normal bowel sounds heard.  MSK:   Normal tone and bulk, 1+ RLE edema  Neuro:  Grossly intact. Alert and oriented.  Data Reviewed: Basic Metabolic Panel:  Recent Labs Lab 04/27/14 0503 04/28/14 0456 04/29/14 0345 04/30/14 0345 05/01/14 0500  NA 135* 138 140 140 140  K 4.2 4.4 5.2 4.0 4.1  CL 101 103 106 104 104  CO2 24 24 24 24 23   GLUCOSE 109* 96 93 89 93  BUN 18 12 9 9 9   CREATININE 1.01 0.91 0.93 0.96 1.03  CALCIUM 9.4 9.5 9.3 9.8 9.4   Liver Function Tests:  Recent Labs Lab 04/25/14 1405  AST 16  ALT <5  ALKPHOS 72  BILITOT 0.3  PROT 7.4  ALBUMIN 3.6   No results found for this basename: LIPASE, AMYLASE,  in the last 168 hours No results found for this basename: AMMONIA,  in the last 168 hours CBC:  Recent Labs Lab 04/25/14 1405  04/27/14 0503 04/28/14 0456 04/29/14 0345 04/30/14 0345 05/01/14 0500  WBC 16.0*  < > 14.4* 8.3 6.4 8.1 8.4  NEUTROABS 14.5*  --   --   --   --   --   --   HGB 11.8*  < > 9.8* 10.2* 10.5* 11.5* 10.9*  HCT 37.3  < > 31.5* 32.9* 33.6* 37.0 35.2*  MCV 91.2  < > 91.8 92.2 92.6 92.3 91.2  PLT 328  < > 240 284 321 349 317  < > = values in this interval not displayed. Cardiac Enzymes: No results found for this basename: CKTOTAL, CKMB, CKMBINDEX, TROPONINI,  in the last 168 hours BNP (last 3 results) No results found for this basename: PROBNP,  in the last 8760 hours CBG: No results found for this basename: GLUCAP,  in the last 168 hours  Recent Results (from the past 240 hour(s))  CULTURE, BLOOD (ROUTINE X 2)     Status: None   Collection Time    04/25/14  2:07 PM      Result Value Ref Range Status   Specimen Description BLOOD LEFT ARM   Final   Special Requests  BOTTLES DRAWN AEROBIC AND ANAEROBIC 5ML   Final   Culture  Setup Time     Final   Value: 04/25/2014 21:51     Performed at [facility]   Culture     Final   Value: NO GROWTH 5 DAYS     Performed at [facility]   Report Status 05/01/2014 FINAL   Final  CULTURE, BLOOD (ROUTINE X  2)     Status: None   Collection Time    04/25/14  2:12 PM      Result Value Ref Range Status   Specimen Description BLOOD RIGHT ARM   Final   Special Requests BOTTLES DRAWN AEROBIC AND ANAEROBIC 5ML   Final   Culture  Setup Time     Final   Value: 04/25/2014 21:52     Performed at [facility]   Culture     Final   Value: GRAM POSITIVE RODS     Note: Gram Stain Report Called to,Read Back By and Verified With: Dawayne Patricia 05/01/14 AT 0240 [place]     Performed at [facility]   Report Status PENDING   Incomplete  URINE CULTURE     Status: None   Collection Time    04/25/14  4:16 PM      Result Value Ref Range Status   Specimen Description URINE, CLEAN CATCH   Final   Special Requests NONE   Final   Culture  Setup Time     Final   Value: 04/26/2014 00:42     Performed at [place]     Final   Value: NO GROWTH     Performed at [facility]   Culture     Final   Value: NO GROWTH     Performed at [facility]   Report Status 04/27/2014 FINAL   Final  CULTURE, EXPECTORATED SPUTUM-ASSESSMENT     Status: None   Collection Time    04/26/14  7:32 AM      Result Value Ref Range Status   Specimen Description SPUTUM   Final   Special Requests Immunocompromised   Final   Sputum evaluation     Final   Value: THIS SPECIMEN IS ACCEPTABLE. RESPIRATORY CULTURE REPORT TO FOLLOW.   Report Status 04/26/2014 FINAL   Final  CULTURE, RESPIRATORY (NON-EXPECTORATED)     Status: None   Collection Time    04/26/14  7:32 AM      Result Value Ref Range Status   Specimen Description SPUTUM   Final   Special Requests NONE   Final   Gram Stain      Final   Value: ABUNDANT WBC PRESENT,BOTH PMN AND MONONUCLEAR     NO SQUAMOUS EPITHELIAL CELLS SEEN     MODERATE GRAM POSITIVE COCCI IN PAIRS     Performed at [facility]   Culture     Final   Value: FEW CANDIDA ALBICANS     Performed at [facility]   Report Status 04/28/2014 FINAL   Final  CLOSTRIDIUM DIFFICILE BY PCR     Status: None   Collection Time    04/29/14  9:37 PM      Result Value Ref Range Status   C difficile by pcr NEGATIVE  NEGATIVE Final   Comment: Performed at [facility]     Studies: Dg Chest 2 View  05/01/2014   CLINICAL DATA:  Short of breath.  Followup pneumonia.  EXAM: CHEST  2 VIEW  COMPARISON:  04/25/2014  FINDINGS: Areas of lung consolidation have improved. There is still mild left lung base opacity which may reflect residual pneumonia, atelectasis or a combination. A small left pleural effusion is noted.  Cardiac silhouette is normal in size. Prominence of the descending thoracic aorta is unchanged. No mediastinal or hilar masses. No pneumothorax.  IMPRESSION: 1. Improved lung infiltrates.  2. Small left pleural effusion. Mild residual left lung base opacity most likely atelectasis. No pulmonary edema.   Electronically Signed   By: Lajean Manes M.D.   On: 05/01/2014 10:33    Scheduled Meds: . acetaminophen (TYLENOL) oral liquid 160 mg/5 mL  650 mg Oral TID  . albuterol  2.5 mg Nebulization BID  . antiseptic oral rinse  15 mL Mouth Rinse BID  . arformoterol  15 mcg Nebulization BID  . atenolol  50 mg Oral BID  . budesonide (PULMICORT) nebulizer solution  0.25 mg Nebulization BID  . diphenoxylate-atropine  1 tablet Oral QID  . enoxaparin (LOVENOX) injection  40 mg Subcutaneous Q24H  . fluconazole  100 mg Oral Daily  . ibuprofen  600 mg Oral Once  . loratadine  10 mg Oral Daily  . pantoprazole  40 mg Oral BID AC  . piperacillin-tazobactam (ZOSYN)  IV  3.375 g Intravenous 3 times per day  . predniSONE  2 mg Oral Q breakfast  .  raloxifene  60 mg Oral q morning - 10a  . saccharomyces boulardii  250 mg Oral BID  . traMADol  100 mg Oral TID  . Vitamin D (Ergocalciferol)  50,000 Units Oral Q21 days   Continuous Infusions:    Principal Problem:   Aspiration pneumonia Active Problems:   Pernicious anemia   Arthritis   Adenocarcinoma of gastroesophageal junction   Hypertension   Degenerative arthritis of hip   Compression fracture of thoracic vertebra   CAP (community acquired pneumonia)   Dysphagia   Odynophagia   Globus sensation   COPD (chronic obstructive pulmonary disease)   Leukocytosis   Sepsis   Acute respiratory failure with hypoxia   Nausea alone   Abdominal pain, epigastric   Diarrhea    Time spent: 30 min   Modena Jansky, MD, FACP, [facility]. [facility] Pager [phone]  If 7PM-7AM, please contact night-coverage [email or web address] Password [email or web address] 05/01/2014, 2:32 PM  05/01/2014, 2:24 PM  LOS: 6 days

## 2014-05-01 NOTE — Progress Notes (Signed)
Physical Therapy Treatment Patient Details Name: [name] MRN: [id number] DOB: 05-Aug-1934 Today's Date: 05/01/2014    History of Present Illness 80 yo female admitted with asp pna. Hx of COPD, anemia, thoracic comp fx, HTN.     PT Comments    Progressing slowly with mobility. Continue to recommend SNF.   Follow Up Recommendations  SNF;Supervision/Assistance - 24 hour     Equipment Recommendations  None recommended by PT    Recommendations for Other Services OT consult     Precautions / Restrictions Precautions Precautions: Fall Restrictions Weight Bearing Restrictions: No    Mobility  Bed Mobility Overal bed mobility: Modified Independent                Transfers Overall transfer level: Needs assistance   Transfers: Sit to/from Stand Sit to Stand: Min guard            Ambulation/Gait Ambulation/Gait assistance: Min assist Ambulation Distance (Feet): 60 Feet   Gait Pattern/deviations: Decreased stride length;Drifts right/left     General Gait Details: unsteady. assist to stabilize throughout ambulation. dyspnea 2/4. 1 brief standing rest break   Financial [employer] (Stroke Patients Only)       Balance                                    Cognition Arousal/Alertness: Awake/alert Behavior During Therapy: WFL for tasks assessed/performed Overall Cognitive Status: Within Functional Limits for tasks assessed                      Exercises General Exercises - Lower Extremity Ankle Circles/Pumps: AROM;Both;10 reps;Supine Quad Sets: AROM;Both;10 reps;Supine Long Arc Quad: AROM;Both;10 reps;Seated Hip ABduction/ADduction: AROM;Both;10 reps;Supine    General Comments        Pertinent Vitals/Pain Chronic abdominal pain-unrated    Home Living                      Prior Function            PT Goals (current goals can now be found in the care plan  section) Progress towards PT goals: Progressing toward goals    Frequency  Min 3X/week    PT Plan Current plan remains appropriate    Co-evaluation             End of Session Equipment Utilized During Treatment: Gait belt Activity Tolerance: Patient limited by fatigue Patient left: in bed;with call bell/phone within reach     Time: 1547-1556 PT Time Calculation (min): 9 min  Charges:  $Gait Training: 8-22 mins                    G Codes:      Weston Anna, MPT Pager: [phone]

## 2014-05-01 NOTE — Progress Notes (Signed)
[facility] lab. Notified the RN that the patient had a positive blood culture. The Aerobic bottle had gram positive rods.

## 2014-05-02 DIAGNOSIS — A419 Sepsis, unspecified organism: Secondary | ICD-10-CM

## 2014-05-02 LAB — CULTURE, BLOOD (ROUTINE X 2)

## 2014-05-02 MED ORDER — SACCHAROMYCES BOULARDII 250 MG PO CAPS: 250.0000 mg | ORAL_CAPSULE | Freq: Two times a day (BID) | ORAL | Status: DC

## 2014-05-02 MED ORDER — AMOXICILLIN-POT CLAVULANATE 875-125 MG PO TABS: 1.0000 | ORAL_TABLET | Freq: Two times a day (BID) | ORAL | Status: DC

## 2014-05-02 MED ORDER — DIPHENOXYLATE-ATROPINE 2.5-0.025 MG PO TABS: 1.0000 | ORAL_TABLET | Freq: Four times a day (QID) | ORAL | Status: DC | PRN

## 2014-05-02 MED ORDER — SALINE SPRAY 0.65 % NA SOLN: 1.0000 | NASAL | Status: AC | PRN

## 2014-05-02 MED ORDER — TRAMADOL HCL 50 MG PO TABS: 100.0000 mg | ORAL_TABLET | Freq: Three times a day (TID) | ORAL | Status: DC

## 2014-05-02 MED ORDER — ACETAMINOPHEN 160 MG/5ML PO SOLN: 650.0000 mg | Freq: Four times a day (QID) | ORAL | Status: DC | PRN

## 2014-05-02 MED ORDER — WHITE PETROLATUM GEL: 1.0000 "application " | Status: DC | PRN

## 2014-05-02 MED ORDER — LORAZEPAM 0.5 MG PO TABS: 0.5000 mg | ORAL_TABLET | Freq: Three times a day (TID) | ORAL | Status: DC | PRN

## 2014-05-02 MED ORDER — GUAIFENESIN-DM 100-10 MG/5ML PO SYRP: 10.0000 mL | ORAL_SOLUTION | ORAL | Status: DC | PRN

## 2014-05-02 NOTE — Progress Notes (Signed)
Patient being discharged to SNF today. Addendum including exam, medication changes and outpatient followup recommendations made to previously dictated discharge summary.  Modena Jansky, MD, FACP, [facility]. [facility] Pager [phone]  If 7PM-7AM, please contact night-coverage [email or web address] Password [email or web address] 05/02/2014, 1:08 PM

## 2014-05-02 NOTE — Progress Notes (Signed)
Addendum  All discharge paperwork for SNF had been done and then patient decided that she did not want to go to SNF and insisted on going home. Discussed extensively with patient that given her prolonged hospitalization for acute illness and generalized weakness, she will benefit from rehabilitation at SNF and returning home may cause decline. She however decided that she wants to go home. She has been provided option of going to SNF from home if she changes her mind. Called pharmacy to ensure no duplication of medications. Arranged home health services. Patient will be discharged home.  Modena Jansky, MD, FACP, [facility]. [facility] Pager [phone]  If 7PM-7AM, please contact night-coverage [email or web address] Password [facility] 05/02/2014, 4:03 PM

## 2014-05-02 NOTE — Discharge Summary (Addendum)
Physician Discharge Summary  [name] LDJ:[id number] DOB: [birth year] DOA: 04/25/2014  PCP: [name], MD  Admit date: 04/25/2014 Discharge date:  05/02/14  Recommendations for Outpatient Follow-up:  1. Dr Briscoe Deutscher, PCP in 1 week with repeat labs (CBC & BMP) 2. Recommend repeating CT chest in 2 weeks to followup on aspiration pneumonia/?? Lung abscess and determine further need for antibiotics. 3. Oral Augmentin for 14 more days from 05/02/14. 4. Cardiology in two month 5. Dr. [name], oncology in 2-3 weeks.   6. Home health PT, OT and RN (patient declined SNF after it had been arranged)   Discharge Diagnoses:  Principal Problem:   Aspiration pneumonia Active Problems:   Pernicious anemia   Arthritis   Adenocarcinoma of gastroesophageal junction   Hypertension   Degenerative arthritis of hip   Compression fracture of thoracic vertebra   CAP (community acquired pneumonia)   Dysphagia   Odynophagia   Globus sensation   COPD (chronic obstructive pulmonary disease)   Leukocytosis   Sepsis   Acute respiratory failure with hypoxia   Nausea alone   Abdominal pain, epigastric   Diarrhea   Discharge Condition: stable, improved  Diet recommendation: regular with thin liquids  Wt Readings from Last 3 Encounters:  04/25/14 63.5 kg (139 lb 15.9 oz)  02/19/14 62.914 kg (138 lb 11.2 oz)  09/20/13 64.411 kg (142 lb)    History of present illness:  The patient is a 80 y.o. year-old female with history of adenocarcinoma of the GE junction diagnosed in 2012 treated with radiation, chemotherapy, and esophagogastrectomy currently in remission, chronic dysphagia to solids and liquids, COPD, chronic anemia who presents with weakness, HTN. She states that she has not felt well since her cancer treatments a few years ago, but she has felt more fatigued over last few weeks. She has chronic cough and SOB attributed to her COPD. Yesterday, she was cleaning the house when she  suddenly became more SOB and wheezy. Last night, she developed some palpitations. She has chronic nausea and epigastric pain 7-8/10 that occasionally possibly radiates to her back. She has sensation of food and liquid getting stuck and sitting in her chest for long periods of time which is worse recently she thinks. She presented to the ER today via EMS because of severe SOB.   In ER, she was 86% on 2L and up to mid-90s on 3L. Febrile to 100.65F with HR in the 90s. WBC 16K. CT angio chest demonstrated extensive left lower lobe consolidation and possible lung abscess. She was started on clindamycin by the ER for aspiration pneumonia.   Hospital Course:   Sepsis and acute hypoxic respiratory failure due to aspiration pneumonia with possible developing lung abscess.  - 1 of 2 blood cultures from admission grew diphtheroids which is likely a contaminant. - Patient was treated empirically with IV Zosyn of which she has completed 7 days so far. - Reviewed CT chest results with pulmonology on 5/27 and they were not fully convinced of lung abscess. As per discussion with pulmonology on 5/27 and infectious disease on 5/28, recommend treating for additional 14 days with oral Augmentin and then repeat CT chest to evaluate. Would like to minimize antibiotics given history of C. difficile. If findings have resolved then no further antibiotics. If persisting findings, consider additional course of antibiotics. - Sputum culture few candida  - Legionella neg, s. pneumo ag positive  - Continue florastor due to ongoing diarrhea. - Chest x-ray 5/27 shows improving lung infiltrates  and small left pleural effusion. Mild residual left lung base opacity most likely atelectasis. No pulmonary edema.  Diarrhea, likely due to abx, but has hx of C. Diff  - C. Diff PCR negative - still has loose stools- likely 2/2 Abx. Seems to be controlled with Lomotil.   Yeast infection  - Patient was treated with a week of fluconazole. DC  fluconazole at discharge and topical agents if she develops any further yeast infection.  Nausea and heaves  - resolved   Headache  - Scheduled Tramadol TID with Tylenol prn   Dysphagia and odynophagia/h/o post gastric pull-through  - Appreciate speech therapy assistance  - Esophogram: Liquid does pass, although slowly from intrathoracic stomach to below diaphragm  - Continue PPI.  Epigastric pain, chronic  - Patient has not complained of any abdominal pain over the last 48 hours. Consider cutting back on Ultram at SNF.  SVT and PVC  - Keep electrolytes wnl  - TSH 0.885  - Patient recently had ECHO done which demonstrated normal structure and function 02/11/2014  - Increase BB  - Cardiology f/u   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 [name] two months ago  - OP FU with Oncology   Chronic steroid use due to arthritis. Low dose but could still be at risk for iatrogenic adrenal insufficiency. BP currently stable.  - Continue 2mg  daily   COPD  - Try to avoid systemic steroids  - Stable  HTN,  - Reasonably controlled. Continue home medications except amlodipine-DC'd  Leukocytosis due to pneumonia, resolved  Pernicious anemia, defer to outpatient management. Stable   Anxiety/depression, stable, continue ativan prn.   Hyponatremia, likely due to mild SIADH from pneumonia, resolved.    1 of 2 blood cultures positive for diphtheroids -likely contamination    Consultants:  None  Procedures:  CT chest  Esophagram  Antibiotics:  Clindamycin 5/21 x 1  Vancomycin 5/21 x 1  Zosyn 5/21 >>  5/28   Discharge Exam:  Patient denies cough. Minimal intermittent dyspnea which she states is chronic for her from COPD. Diarrhea improved/controlled on medications. Denies any other complaints.   Filed Vitals:   05/02/14 1336  BP: 122/74  Pulse: 76  Temp: 97.6 F (36.4 C)  Resp: 18   Filed Vitals:   05/02/14 0418 05/02/14 0500 05/02/14  0818 05/02/14 1336  BP: 159/92 140/82  122/74  Pulse: 84   76  Temp: 97.7 F (36.5 C)   97.6 F (36.4 C)  TempSrc: Oral   Oral  Resp: 18   18  Height:      Weight:      SpO2: 95%  93% 97%    General:  pleasant elderly female sitting at edge of bed in no obvious distress.  HEENT: NCAT, MMM  Cardiovascular: RRR, nl S1, S2 no mrg, 2+ pulses, warm extremities  Respiratory:  few crackles in the bases left >right. Rest of lung fields clear to auscultation without wheezing or rhonchi. No increased work of breathing.   Abdomen:  Nondistended, soft and nontender. Normal bowel sounds heard. MSK: Normal tone and bulk, 1+ RLE edema  Neuro: Grossly intact   Discharge Instructions      Discharge Instructions   Call MD for:  difficulty breathing, headache or visual disturbances    Complete by:  As directed      Call MD for:  extreme fatigue    Complete by:  As directed  Call MD for:  hives    Complete by:  As directed      Call MD for:  persistant dizziness or light-headedness    Complete by:  As directed      Call MD for:  persistant nausea and vomiting    Complete by:  As directed      Call MD for:  severe uncontrolled pain    Complete by:  As directed      Call MD for:  temperature >100.4    Complete by:  As directed      Diet general    Complete by:  As directed      Increase activity slowly    Complete by:  As directed             Medication List    STOP taking these medications       acetaminophen 500 MG tablet  Commonly known as:  TYLENOL  Replaced by:  acetaminophen 160 MG/5ML solution     amLODipine 2.5 MG tablet  Commonly known as:  NORVASC      TAKE these medications       acetaminophen 160 MG/5ML solution  Commonly known as:  TYLENOL  Take 20.3 mLs (650 mg total) by mouth every 6 (six) hours as needed for mild pain, moderate pain, fever or headache.     acetic acid-hydrocortisone otic solution  Commonly known as:  VOSOL-HC  Place 1 drop into both  ears 3 (three) times daily as needed (pain and itching).     albuterol 108 (90 BASE) MCG/ACT inhaler  Commonly known as:  PROVENTIL HFA;VENTOLIN HFA  Inhale 2 puffs into the lungs every 6 (six) hours as needed for wheezing (wheezing).     amoxicillin 500 MG capsule  Commonly known as:  AMOXIL  Take 2,000 mg by mouth See admin instructions. Takes 4 capsules 1 hour prior to appointment     amoxicillin-clavulanate 875-125 MG per tablet  Commonly known as:  AUGMENTIN  Take 1 tablet by mouth 2 (two) times daily. Take for 14 days beginning 05/02/14 and then discontinue.     aspirin EC 81 MG tablet  Take 81 mg by mouth daily.     atenolol 50 MG tablet  Commonly known as:  TENORMIN  Take 50 mg by mouth 2 (two) times daily.     budesonide-formoterol 160-4.5 MCG/ACT inhaler  Commonly known as:  SYMBICORT  Inhale 2 puffs into the lungs 2 (two) times daily.     CALCIUM-VITAMIN D-VITAMIN K PO  Take 1 tablet by mouth 2 (two) times daily.     diphenoxylate-atropine 2.5-0.025 MG per tablet  Commonly known as:  LOMOTIL  Take 1 tablet by mouth 4 (four) times daily as needed for diarrhea or loose stools.     esomeprazole 20 MG capsule  Commonly known as:  NEXIUM  Take 20 mg by mouth daily at 12 noon.     furosemide 20 MG tablet  Commonly known as:  LASIX  Take 20 mg by mouth daily as needed for fluid.     guaiFENesin-dextromethorphan 100-10 MG/5ML syrup  Commonly known as:  ROBITUSSIN DM  Take 10 mLs by mouth every 4 (four) hours as needed for cough.     hydroxypropyl methylcellulose 2.5 % ophthalmic solution  Commonly known as:  ISOPTO TEARS  Place 1 drop into both eyes 3 (three) times daily as needed for dry eyes.     lisinopril 40 MG tablet  Commonly known as:  PRINIVIL,ZESTRIL  Take 40 mg by mouth every morning.     loratadine 10 MG tablet  Commonly known as:  CLARITIN  Take 10 mg by mouth daily.     LORazepam 0.5 MG tablet  Commonly known as:  ATIVAN  Take 1 tablet (0.5 mg  total) by mouth every 8 (eight) hours as needed for anxiety.     predniSONE 1 MG tablet  Commonly known as:  DELTASONE  Take 2 mg by mouth daily with breakfast. Takes 2 tablets daily     raloxifene 60 MG tablet  Commonly known as:  EVISTA  Take 60 mg by mouth every morning.     saccharomyces boulardii 250 MG capsule  Commonly known as:  FLORASTOR  Take 1 capsule (250 mg total) by mouth 2 (two) times daily.     sodium chloride 0.65 % Soln nasal spray  Commonly known as:  OCEAN  Place 1 spray into both nostrils as needed for congestion.     ST JOHNS WORT PO  Take 1 capsule by mouth 2 (two) times daily.     tiZANidine 4 MG tablet  Commonly known as:  ZANAFLEX  Take 2-4 mg by mouth every 8 (eight) hours as needed for muscle spasms. TAKES 1/2 TABLET-MUSCLE STIFFNESS     traMADol 50 MG tablet  Commonly known as:  ULTRAM  Take 2 tablets (100 mg total) by mouth 3 (three) times daily.     Vitamin D3 50000 UNITS Caps  Take 1 capsule by mouth every 21 ( twenty-one) days.     white petrolatum Gel  Commonly known as:  VASELINE  Apply 1 application topically as needed (anterior nares to prevent nose bleed).       Follow-up Information   Follow up with FRIED, ROBERT L, MD. Schedule an appointment as soon as possible for a visit in 1 week. (To be seen with repeat labs (CBC & BMP).)    Specialty:  Family Medicine   Contact information:   [place] [address] [phone]       Follow up with Betsy Coder, MD. Schedule an appointment as soon as possible for a visit in 2 weeks.   Specialty:  Oncology   Contact information:   [place] [address] [phone]       Follow up with [facility]). Schedule an appointment as soon as possible for a visit in 2 months.   Specialty:  Cardiology   Contact information:   [address], [place] [address] [phone]       The results of significant  diagnostics from this hospitalization (including imaging, microbiology, ancillary and laboratory) are listed below for reference.    Significant Diagnostic Studies: Ct Angio Chest Pe W/cm &/or Wo Cm  04/25/2014   EXAM: CT ANGIOGRAPHY CHEST WITH CONTRAST  TECHNIQUE: Multidetector CT imaging of the chest was performed using the standard protocol during bolus administration of intravenous contrast. Multiplanar CT image reconstructions and MIPs were obtained to evaluate the vascular anatomy.  CONTRAST:  134mL OMNIPAQUE IOHEXOL 350 MG/ML SOLN  COMPARISON:  12/12/2012.  FINDINGS: No evidence for pulmonary emboli. Atheromatous change transverse arch. Sequelae of gastric pull-through, with dilated fluid-filled gastric pull up.  Atherosclerotic changes greatest at the descending thoracic cord are. Coronary artery calcifications with cardiomegaly. Slight pericardial effusion.  Moderate bilateral pleural effusions. COPD. Tiny bilateral pulmonary nodules) image 39, 41) noted previously not calcified.  Extensive left lower  lobe consolidation involving the superior segment and posterior basal segment could represent aspiration. Air-fluid level as seen on image 29 series 7 could represent pulmonary abscess versus layering fluid within an air cyst. Minor infiltrates or atelectatic changes right base.  Chronic compression deformity T5. Treated vertebral body compression fractures with vertebral augmentation at T6, T8, and T9. No worrisome osseous lesions.  Upper abdominal organs unremarkable.  Review of the MIP images confirms the above findings.  IMPRESSION: Extensive left lower lobe consolidation involving superior segment and posterior basal segments could represent aspiration pneumonia in this patient with a gastric pull-through and a dilated fluid-filled gastric pull up.  Air-fluid level could represent pulmonary abscess versus layering fluid within air cyst.  Bilateral pleural effusions.  No evidence for pulmonary emboli.    Electronically Signed   By: Rolla Flatten M.D.   On: 04/25/2014 15:42   Dg Esophagus  04/26/2014   CLINICAL DATA:  80 year old female with history of esophageal cancer, partial esophagectomy and gastric pull-through. Also treated with radiation. Admitted with pneumonia, difficulty swallowing, coughing, and choking. Initial encounter.  EXAM: ESOPHOGRAM/BARIUM SWALLOW  TECHNIQUE: Single contrast examination was performed using  barium.  FLUOROSCOPY TIME:  1 min 38 seconds.  COMPARISON:  Esophagram 12/27/2011. Chest and abdomen CT 12/12/2012.  FINDINGS: Due to limited mobility, the study was performed with the fluoroscopy table inclined at 45 degrees. A single contrast study was performed.  The patient tolerated PO barium well and without difficulty.  The patient did cough after some swallows of barium, but no definite aspiration was identified.  Sequelae of partial esophagectomy and gastric pull-through are noted. No obstruction to the Ford flow of barium into the gastric pull-through. Barium was slow to empty from the intra thoracic stomach into the abdomen, but at the conclusion of the study about half of the ingested barium had reached the duodenum C loop.  No anastomotic stricture identified.  No extravasation.  Numerous surgical clips in the mediastinum. Postprocedural abdominal film demonstrating extensive flank soft tissue calcifications, and previous bilateral hip arthroplasty.  IMPRESSION: 1. Satisfactory swallow study in this patient who is status post subtotal esophagectomy and gastric pull-through. 2. Some coughing was noted with swallowing during this study, raising the possibility of aspiration, but no definite aspiration was identified. A followup modified barium swallow would be more sensitive.   Electronically Signed   By: Lars Pinks M.D.   On: 04/26/2014 11:51   Dg Chest Portable 1 View  04/25/2014   CLINICAL DATA:  Weakness and shortness of breath.  COPD.  EXAM: PORTABLE CHEST - 1 VIEW   COMPARISON:  02/07/2014.  FINDINGS: Cardiomegaly persists. Calcified tortuous aorta. Mild vascular congestion. Slight left pleural effusion. Osteopenia.  IMPRESSION: Cardiomegaly with mild vascular congestion. Small left effusion. Worsening aeration from priors.   Electronically Signed   By: Rolla Flatten M.D.   On: 04/25/2014 14:17    Microbiology: Recent Results (from the past 240 hour(s))  CULTURE, BLOOD (ROUTINE X 2)     Status: None   Collection Time    04/25/14  2:07 PM      Result Value Ref Range Status   Specimen Description BLOOD LEFT ARM   Final   Special Requests BOTTLES DRAWN AEROBIC AND ANAEROBIC 5ML   Final   Culture  Setup Time     Final   Value: 04/25/2014 21:51     Performed at [facility]   Culture     Final   Value: NO GROWTH 5 DAYS  Performed at [facility]   Report Status 05/01/2014 FINAL   Final  CULTURE, BLOOD (ROUTINE X 2)     Status: None   Collection Time    04/25/14  2:12 PM      Result Value Ref Range Status   Specimen Description BLOOD RIGHT ARM   Final   Special Requests BOTTLES DRAWN AEROBIC AND ANAEROBIC 5ML   Final   Culture  Setup Time     Final   Value: 04/25/2014 21:52     Performed at [facility]   Culture     Final   Value: DIPHTHEROIDS(CORYNEBACTERIUM SPECIES)     Note: Standardized susceptibility testing for this organism is not available.     Note: Gram Stain Report Called to,Read Back By and Verified With: Dawayne Patricia 05/01/14 AT 0240 [place]     Performed at [facility]   Report Status 05/02/2014 FINAL   Final  URINE CULTURE     Status: None   Collection Time    04/25/14  4:16 PM      Result Value Ref Range Status   Specimen Description URINE, CLEAN CATCH   Final   Special Requests NONE   Final   Culture  Setup Time     Final   Value: 04/26/2014 00:42     Performed at [facility]     Final   Value: NO GROWTH     Performed at [facility]   Culture     Final    Value: NO GROWTH     Performed at [facility]   Report Status 04/27/2014 FINAL   Final  CULTURE, EXPECTORATED SPUTUM-ASSESSMENT     Status: None   Collection Time    04/26/14  7:32 AM      Result Value Ref Range Status   Specimen Description SPUTUM   Final   Special Requests Immunocompromised   Final   Sputum evaluation     Final   Value: THIS SPECIMEN IS ACCEPTABLE. RESPIRATORY CULTURE REPORT TO FOLLOW.   Report Status 04/26/2014 FINAL   Final  CULTURE, RESPIRATORY (NON-EXPECTORATED)     Status: None   Collection Time    04/26/14  7:32 AM      Result Value Ref Range Status   Specimen Description SPUTUM   Final   Special Requests NONE   Final   Gram Stain     Final   Value: ABUNDANT WBC PRESENT,BOTH PMN AND MONONUCLEAR     NO SQUAMOUS EPITHELIAL CELLS SEEN     MODERATE GRAM POSITIVE COCCI IN PAIRS     Performed at [facility]   Culture     Final   Value: FEW CANDIDA ALBICANS     Performed at [facility]   Report Status 04/28/2014 FINAL   Final  CLOSTRIDIUM DIFFICILE BY PCR     Status: None   Collection Time    04/29/14  9:37 PM      Result Value Ref Range Status   C difficile by pcr NEGATIVE  NEGATIVE Final   Comment: Performed at [place]: Basic Metabolic Panel:  Recent Labs Lab 04/27/14 0503 04/28/14 0456 04/29/14 0345 04/30/14 0345 05/01/14 0500  NA 135* 138 140 140 140  K 4.2 4.4 5.2 4.0 4.1  CL 101 103 106 104 104  CO2 24 24 24 24 23   GLUCOSE 109* 96 93 89 93  BUN  18 12 9 9 9   CREATININE 1.01 0.91 0.93 0.96 1.03  CALCIUM 9.4 9.5 9.3 9.8 9.4   Liver Function Tests: No results found for this basename: AST, ALT, ALKPHOS, BILITOT, PROT, ALBUMIN,  in the last 168 hours No results found for this basename: LIPASE, AMYLASE,  in the last 168 hours No results found for this basename: AMMONIA,  in the last 168 hours CBC:  Recent Labs Lab 04/27/14 0503 04/28/14 0456 04/29/14 0345 04/30/14 0345 05/01/14 0500   WBC 14.4* 8.3 6.4 8.1 8.4  HGB 9.8* 10.2* 10.5* 11.5* 10.9*  HCT 31.5* 32.9* 33.6* 37.0 35.2*  MCV 91.8 92.2 92.6 92.3 91.2  PLT 240 284 321 349 317   Cardiac Enzymes: No results found for this basename: CKTOTAL, CKMB, CKMBINDEX, TROPONINI,  in the last 168 hours BNP: BNP (last 3 results) No results found for this basename: PROBNP,  in the last 8760 hours CBG: No results found for this basename: GLUCAP,  in the last 168 hours  Time coordinating discharge: 45 minutes  Signed:  Modena Jansky, MD, FACP, [facility]. [facility] Pager [phone]  If 7PM-7AM, please contact night-coverage [email or web address] Password [facility] 05/02/2014, 3:56 PM

## 2014-05-02 NOTE — Progress Notes (Signed)
Patient has a bed at [facility], anticipating discharge today.   Raynaldo Opitz, [facility] Clinical Social Worker cell #: [phone]

## 2014-05-02 NOTE — Progress Notes (Addendum)
CSW met with patient along with [place], [facility] liasion re: discharge planning. Patient is now stating that she would prefer to return home with home health, rather than go to SNF now. Dr. Algis Liming & RNCM, Juliann Pulse made aware.   No further CSW needs identified - CSW signing off.   Clinical Social Work Department CLINICAL SOCIAL WORK PLACEMENT NOTE 05/02/2014  Patient:  TONGA, PROUT  Account Number:  [id number] Admit date:  04/25/2014  Clinical Social Worker:  Renold Genta  Date/time:  04/29/2014 03:31 PM  Clinical Social Work is seeking post-discharge placement for this patient at the following level of care:   SKILLED NURSING   (*CSW will update this form in Epic as items are completed)   04/29/2014  Patient/family provided with [place] Department of Clinical Social Work's list of facilities offering this level of care within the geographic area requested by the patient (or if unable, by the patient's family).  04/29/2014  Patient/family informed of their freedom to choose among providers that offer the needed level of care, that participate in Medicare, Medicaid or managed care program needed by the patient, have an available bed and are willing to accept the patient.  04/29/2014  Patient/family informed of [facility]' ownership interest in [facility], as well as of the fact that they are under no obligation to receive care at this facility.  PASARR submitted to EDS on 04/29/2014 PASARR number received from EDS on 04/29/2014  FL2 transmitted to all facilities in geographic area requested by pt/family on  04/29/2014 FL2 transmitted to all facilities within larger geographic area on   Patient informed that his/her managed care company has contracts with or will negotiate with  certain facilities, including the following:     Patient/family informed of bed offers received:  04/29/2014 Patient chooses bed at [facility],  [place] Physician recommends and patient chooses bed at    Patient to be transferred to  on   Patient to be transferred to facility by   The following physician request were entered in Epic:   Additional Comments:   Raynaldo Opitz, [place] Social Worker cell #: [phone]

## 2014-05-06 ENCOUNTER — Encounter (HOSPITAL_COMMUNITY): Payer: Self-pay | Admitting: Emergency Medicine

## 2014-05-06 ENCOUNTER — Emergency Department (HOSPITAL_COMMUNITY): Payer: Medicare Other

## 2014-05-06 ENCOUNTER — Inpatient Hospital Stay (HOSPITAL_COMMUNITY)
Admission: EM | Admit: 2014-05-06 | Discharge: 2014-05-13 | DRG: 177 | Disposition: A | Discharge status: Skilled Nursing Facility | Payer: Medicare Other | Attending: Internal Medicine | Admitting: Internal Medicine

## 2014-05-06 DIAGNOSIS — D638 Anemia in other chronic diseases classified elsewhere: Secondary | ICD-10-CM | POA: Diagnosis present

## 2014-05-06 DIAGNOSIS — L989 Disorder of the skin and subcutaneous tissue, unspecified: Secondary | ICD-10-CM

## 2014-05-06 DIAGNOSIS — Z803 Family history of malignant neoplasm of breast: Secondary | ICD-10-CM

## 2014-05-06 DIAGNOSIS — D72829 Elevated white blood cell count, unspecified: Secondary | ICD-10-CM

## 2014-05-06 DIAGNOSIS — J438 Other emphysema: Secondary | ICD-10-CM | POA: Diagnosis present

## 2014-05-06 DIAGNOSIS — E43 Unspecified severe protein-calorie malnutrition: Secondary | ICD-10-CM | POA: Diagnosis present

## 2014-05-06 DIAGNOSIS — R197 Diarrhea, unspecified: Secondary | ICD-10-CM

## 2014-05-06 DIAGNOSIS — IMO0002 Reserved for concepts with insufficient information to code with codable children: Secondary | ICD-10-CM

## 2014-05-06 DIAGNOSIS — J189 Pneumonia, unspecified organism: Secondary | ICD-10-CM

## 2014-05-06 DIAGNOSIS — K449 Diaphragmatic hernia without obstruction or gangrene: Secondary | ICD-10-CM | POA: Diagnosis present

## 2014-05-06 DIAGNOSIS — J9601 Acute respiratory failure with hypoxia: Secondary | ICD-10-CM

## 2014-05-06 DIAGNOSIS — I10 Essential (primary) hypertension: Secondary | ICD-10-CM

## 2014-05-06 DIAGNOSIS — E876 Hypokalemia: Secondary | ICD-10-CM | POA: Diagnosis present

## 2014-05-06 DIAGNOSIS — J690 Pneumonitis due to inhalation of food and vomit: Secondary | ICD-10-CM

## 2014-05-06 DIAGNOSIS — Z860100 Personal history of colon polyps, unspecified: Secondary | ICD-10-CM

## 2014-05-06 DIAGNOSIS — K219 Gastro-esophageal reflux disease without esophagitis: Secondary | ICD-10-CM | POA: Diagnosis present

## 2014-05-06 DIAGNOSIS — M810 Age-related osteoporosis without current pathological fracture: Secondary | ICD-10-CM | POA: Diagnosis present

## 2014-05-06 DIAGNOSIS — D510 Vitamin B12 deficiency anemia due to intrinsic factor deficiency: Secondary | ICD-10-CM

## 2014-05-06 DIAGNOSIS — A419 Sepsis, unspecified organism: Secondary | ICD-10-CM

## 2014-05-06 DIAGNOSIS — IMO0001 Reserved for inherently not codable concepts without codable children: Secondary | ICD-10-CM | POA: Diagnosis present

## 2014-05-06 DIAGNOSIS — Z96649 Presence of unspecified artificial hip joint: Secondary | ICD-10-CM

## 2014-05-06 DIAGNOSIS — M797 Fibromyalgia: Secondary | ICD-10-CM

## 2014-05-06 DIAGNOSIS — R09A2 Foreign body sensation, throat: Secondary | ICD-10-CM

## 2014-05-06 DIAGNOSIS — K274 Chronic or unspecified peptic ulcer, site unspecified, with hemorrhage: Secondary | ICD-10-CM

## 2014-05-06 DIAGNOSIS — R1013 Epigastric pain: Secondary | ICD-10-CM

## 2014-05-06 DIAGNOSIS — Z7982 Long term (current) use of aspirin: Secondary | ICD-10-CM

## 2014-05-06 DIAGNOSIS — R55 Syncope and collapse: Secondary | ICD-10-CM

## 2014-05-06 DIAGNOSIS — Z923 Personal history of irradiation: Secondary | ICD-10-CM

## 2014-05-06 DIAGNOSIS — R1310 Dysphagia, unspecified: Secondary | ICD-10-CM

## 2014-05-06 DIAGNOSIS — J90 Pleural effusion, not elsewhere classified: Secondary | ICD-10-CM

## 2014-05-06 DIAGNOSIS — Z87891 Personal history of nicotine dependence: Secondary | ICD-10-CM

## 2014-05-06 DIAGNOSIS — Z01810 Encounter for preprocedural cardiovascular examination: Secondary | ICD-10-CM

## 2014-05-06 DIAGNOSIS — R110 Nausea: Secondary | ICD-10-CM

## 2014-05-06 DIAGNOSIS — J9600 Acute respiratory failure, unspecified whether with hypoxia or hypercapnia: Secondary | ICD-10-CM

## 2014-05-06 DIAGNOSIS — K284 Chronic or unspecified gastrojejunal ulcer with hemorrhage: Secondary | ICD-10-CM

## 2014-05-06 DIAGNOSIS — E872 Acidosis, unspecified: Secondary | ICD-10-CM | POA: Diagnosis present

## 2014-05-06 DIAGNOSIS — J449 Chronic obstructive pulmonary disease, unspecified: Secondary | ICD-10-CM

## 2014-05-06 DIAGNOSIS — I4430 Unspecified atrioventricular block: Secondary | ICD-10-CM

## 2014-05-06 DIAGNOSIS — C160 Malignant neoplasm of cardia: Secondary | ICD-10-CM

## 2014-05-06 DIAGNOSIS — R0989 Other specified symptoms and signs involving the circulatory and respiratory systems: Secondary | ICD-10-CM

## 2014-05-06 DIAGNOSIS — M1990 Unspecified osteoarthritis, unspecified site: Secondary | ICD-10-CM

## 2014-05-06 DIAGNOSIS — S22000A Wedge compression fracture of unspecified thoracic vertebra, initial encounter for closed fracture: Secondary | ICD-10-CM

## 2014-05-06 DIAGNOSIS — Z79899 Other long term (current) drug therapy: Secondary | ICD-10-CM

## 2014-05-06 DIAGNOSIS — I959 Hypotension, unspecified: Secondary | ICD-10-CM | POA: Diagnosis present

## 2014-05-06 DIAGNOSIS — Z9289 Personal history of other medical treatment: Secondary | ICD-10-CM

## 2014-05-06 DIAGNOSIS — M169 Osteoarthritis of hip, unspecified: Secondary | ICD-10-CM

## 2014-05-06 LAB — CBC WITH DIFFERENTIAL/PLATELET
BASOS ABS: 0.1 10*3/uL (ref 0.0–0.1)
Basophils Relative: 1 % (ref 0–1)
EOS ABS: 0.1 10*3/uL (ref 0.0–0.7)
EOS PCT: 1 % (ref 0–5)
HEMATOCRIT: 32.2 % — AB (ref 36.0–46.0)
Hemoglobin: 10.2 g/dL — ABNORMAL LOW (ref 12.0–15.0)
Lymphocytes Relative: 10 % — ABNORMAL LOW (ref 12–46)
Lymphs Abs: 1.3 10*3/uL (ref 0.7–4.0)
MCH: 28.7 pg (ref 26.0–34.0)
MCHC: 31.7 g/dL (ref 30.0–36.0)
MCV: 90.4 fL (ref 78.0–100.0)
MONO ABS: 1.5 10*3/uL — AB (ref 0.1–1.0)
Monocytes Relative: 12 % (ref 3–12)
Neutro Abs: 9.8 10*3/uL — ABNORMAL HIGH (ref 1.7–7.7)
Neutrophils Relative %: 76 % (ref 43–77)
Platelets: 461 10*3/uL — ABNORMAL HIGH (ref 150–400)
RBC: 3.56 MIL/uL — ABNORMAL LOW (ref 3.87–5.11)
RDW: 15.3 % (ref 11.5–15.5)
WBC: 12.9 10*3/uL — ABNORMAL HIGH (ref 4.0–10.5)

## 2014-05-06 NOTE — ED Provider Notes (Signed)
CSN: [id number]     Arrival date & time 05/06/14  2330 History   First MD Initiated Contact with Patient 05/06/14 2341     Chief Complaint  Patient presents with  . Shortness of Breath  . Chest Pain     (Consider location/radiation/quality/duration/timing/severity/associated sxs/prior Treatment) HPI Comments: Patient is a 80 year old female past medical history COPD, pernicious anemia, fibromyalgia, hypertension brought in by EMS from home to the emergency dapartment chief complaint of worsening shortness of breath. The patient reports shortness of breath for "several years now" she reports chest pain for "somewhile now", hard to tell with "Fibromyaglia and everything hurting".  She she reports persistent cough since discharge.  She reports generalized fatigue. And decrease in appetite for 4 days.  She reports nausea and vomiting, last emesis today, but was able to eat soup.  Denies abdominal pain, diarrhea, constipation. Patient reports she lives at home with her son. Is not on oxygen at home.  The history is provided by the patient and medical records. No language interpreter was used.    Past Medical History  Diagnosis Date  . Pernicious anemia   . Osteoarthritis   . Fibromyalgia   . Skin lesion     chronic calcific cutaneous lesions  . HX of multiple bleeding ulcers 09/01/2011  . Hypertension     takes Atenolol daily  . Emphysema   . Bronchitis     hx  . Adenocarcinoma of gastroesophageal junction   . Dizziness     d/t crystals in ears that "roll around"and can't get them out  . Back pain   . Osteoporosis   . Scoliosis   . Psoriasis   . H/O hiatal hernia   . GERD (gastroesophageal reflux disease)     takes Aciphex daily  . Gastric ulcer   . Constipation     since Chemo and Radiation;finished up in Nov 2012  . Diverticulosis   . History of colonic polyps   . Shingles     broke out 2wks ago;has been on meds and to finish up tomorrow  . Kidney stone 25+yrs ago  .  Pernicious anemia   . Vitamin B deficiency   . Complication of anesthesia     shortness of breath and pain with gas   . COPD (chronic obstructive pulmonary disease)   . Shortness of breath     with exertion   . Blood transfusion    Past Surgical History  Procedure Laterality Date  . Right hip replacement  2001  . Tonsillectomy      as a child  . Tubal ligation  1975  . Appendectomy  1975  . Cataract surgery  10+yrs ago  . Esophagogastroduodenoscopy    . Colonoscopy    . Extubation  12/20/2011       . Partial esophagectomy  12/20/2011    Procedure: ESOPHAGECTOMY PARTIAL;  Surgeon: Pierre Bali, MD;  Location: [facility];  Service: Thoracic;  Laterality: N/A;  . Total hip arthroplasty  06/30/2012    Procedure: TOTAL HIP ARTHROPLASTY ANTERIOR APPROACH;  Surgeon: Mcarthur Rossetti, MD;  Location: [facility];  Service: Orthopedics;  Laterality: Left;  Left total hip arthroplasty   Family History  Problem Relation Age of Onset  . Cancer Maternal Grandmother     gynecologic  . Cancer Maternal Aunt     gynecologic  . Breast cancer Paternal Aunt   . Breast cancer Paternal Aunt   . Breast cancer Paternal Aunt   . Anesthesia problems  Neg Hx   . Hypotension Neg Hx   . Malignant hyperthermia Neg Hx   . Pseudochol deficiency Neg Hx    History  Substance Use Topics  . Smoking status: Former Smoker -- 3.00 packs/day    Types: Cigarettes    Quit date: 12/07/1991  . Smokeless tobacco: Never Used  . Alcohol Use: No   OB History   Grav Para Term Preterm Abortions TAB SAB Ect Mult Living                 Review of Systems  Constitutional: Positive for fever and fatigue.  Respiratory: Positive for cough and shortness of breath.   Cardiovascular: Positive for chest pain. Negative for leg swelling.  Gastrointestinal: Positive for nausea and vomiting. Negative for abdominal pain.      Allergies  Keflex; Nulecit; Spiriva handihaler; Tramadol; Adhesive; Clarithromycin; Darvon;  Epinephrine; Morphine and related; Oxycodone; Sulfa antibiotics; and Vicodin  Home Medications   Prior to Admission medications   Medication Sig Start Date End Date Taking? Authorizing Provider  calcium-vitamin D (OSCAL WITH D) 500-200 MG-UNIT per tablet Take 1 tablet by mouth daily.   Yes Historical Provider, MD  acetaminophen (TYLENOL) 160 MG/5ML solution Take 20.3 mLs (650 mg total) by mouth every 6 (six) hours as needed for mild pain, moderate pain, fever or headache. 05/02/14   Modena Jansky, MD  acetic acid-hydrocortisone (VOSOL-HC) otic solution Place 1 drop into both ears 3 (three) times daily as needed (pain and itching).  04/17/14   Historical Provider, MD  albuterol (PROVENTIL HFA;VENTOLIN HFA) 108 (90 BASE) MCG/ACT inhaler Inhale 2 puffs into the lungs every 6 (six) hours as needed for wheezing (wheezing).    Historical Provider, MD  amLODipine (NORVASC) 2.5 MG tablet Take 1 tablet by mouth daily. 05/05/14   Historical Provider, MD  amoxicillin-clavulanate (AUGMENTIN) 875-125 MG per tablet Take 1 tablet by mouth 2 (two) times daily. Take for 14 days beginning 05/02/14 and then discontinue. 05/02/14   Modena Jansky, MD  aspirin EC 81 MG tablet Take 81 mg by mouth daily.    Historical Provider, MD  atenolol (TENORMIN) 50 MG tablet Take 50 mg by mouth 2 (two) times daily.    Historical Provider, MD  budesonide-formoterol (SYMBICORT) 160-4.5 MCG/ACT inhaler Inhale 2 puffs into the lungs 2 (two) times daily.    Historical Provider, MD  Cholecalciferol (VITAMIN D3) 50000 UNITS CAPS Take 1 capsule by mouth every 21 ( twenty-one) days.    Historical Provider, MD  diphenoxylate-atropine (LOMOTIL) 2.5-0.025 MG per tablet Take 1 tablet by mouth 4 (four) times daily as needed for diarrhea or loose stools. 05/02/14   Modena Jansky, MD  esomeprazole (NEXIUM) 20 MG capsule Take 20 mg by mouth daily at 12 noon.    Historical Provider, MD  furosemide (LASIX) 20 MG tablet Take 20 mg by mouth daily as  needed for fluid or edema.  02/07/14   Historical Provider, MD  guaiFENesin-dextromethorphan (ROBITUSSIN DM) 100-10 MG/5ML syrup Take 10 mLs by mouth every 4 (four) hours as needed for cough. 05/02/14   Modena Jansky, MD  hydroxypropyl methylcellulose (ISOPTO TEARS) 2.5 % ophthalmic solution Place 1 drop into both eyes 3 (three) times daily as needed for dry eyes.    Historical Provider, MD  lisinopril (PRINIVIL,ZESTRIL) 40 MG tablet Take 40 mg by mouth every morning.     Historical Provider, MD  loratadine (CLARITIN) 10 MG tablet Take 10 mg by mouth daily.    Historical Provider,  MD  LORazepam (ATIVAN) 0.5 MG tablet Take 1 tablet (0.5 mg total) by mouth every 8 (eight) hours as needed for anxiety. 05/02/14   Modena Jansky, MD  predniSONE (DELTASONE) 1 MG tablet Take 2 mg by mouth daily with breakfast. Takes 2 tablets daily 03/02/13   Historical Provider, MD  raloxifene (EVISTA) 60 MG tablet Take 60 mg by mouth every morning.     Historical Provider, MD  saccharomyces boulardii (FLORASTOR) 250 MG capsule Take 1 capsule (250 mg total) by mouth 2 (two) times daily. 05/02/14   Modena Jansky, MD  sodium chloride (OCEAN) 0.65 % SOLN nasal spray Place 1 spray into both nostrils as needed for congestion. 05/02/14   Modena Jansky, MD  ST JOHNS WORT PO Take 1 capsule by mouth 2 (two) times daily.    Historical Provider, MD  tiZANidine (ZANAFLEX) 4 MG tablet Take 2-4 mg by mouth every 8 (eight) hours as needed for muscle spasms.     Historical Provider, MD  traMADol (ULTRAM) 50 MG tablet Take 2 tablets (100 mg total) by mouth 3 (three) times daily. 05/02/14   Modena Jansky, MD  white petrolatum (VASELINE) GEL Apply 1 application topically as needed (anterior nares to prevent nose bleed). 05/02/14   Modena Jansky, MD   BP 128/74  Temp(Src) 97.8 F (36.6 C) (Oral)  Resp 21  SpO2 98% Physical Exam  Nursing note and vitals reviewed. Constitutional: She is oriented to person, place, and time. She  appears well-developed and well-nourished. No distress.  Elderly female  HENT:  Head: Normocephalic and atraumatic.  Eyes: EOM are normal. Pupils are equal, round, and reactive to light. No scleral icterus.  Neck: Neck supple.  Cardiovascular: Normal rate and regular rhythm.   Murmur heard. No lower extremity edema.  Pulmonary/Chest: Tachypnea noted. No respiratory distress. She has decreased breath sounds. She has wheezes. She has rhonchi.  Mild expiratory wheezing right greater than left. Decreased breath sounds on the left lower lobe.  Abdominal: Soft. Bowel sounds are normal. There is no tenderness. There is no rigidity, no rebound and no guarding.  Musculoskeletal: Normal range of motion. She exhibits no edema.  Neurological: She is alert and oriented to person, place, and time.  Skin: Skin is warm and dry. No rash noted. She is not diaphoretic.  Psychiatric: She has a normal mood and affect.    ED Course  Procedures (including critical care time) Labs Review Results for orders placed during the hospital encounter of 05/06/14  CBC WITH DIFFERENTIAL      Result Value Ref Range   WBC 12.9 (*) 4.0 - 10.5 K/uL   RBC 3.56 (*) 3.87 - 5.11 MIL/uL   Hemoglobin 10.2 (*) 12.0 - 15.0 g/dL   HCT 32.2 (*) 36.0 - 46.0 %   MCV 90.4  78.0 - 100.0 fL   MCH 28.7  26.0 - 34.0 pg   MCHC 31.7  30.0 - 36.0 g/dL   RDW 15.3  11.5 - 15.5 %   Platelets 461 (*) 150 - 400 K/uL   Neutrophils Relative % 76  43 - 77 %   Neutro Abs 9.8 (*) 1.7 - 7.7 K/uL   Lymphocytes Relative 10 (*) 12 - 46 %   Lymphs Abs 1.3  0.7 - 4.0 K/uL   Monocytes Relative 12  3 - 12 %   Monocytes Absolute 1.5 (*) 0.1 - 1.0 K/uL   Eosinophils Relative 1  0 - 5 %   Eosinophils Absolute  0.1  0.0 - 0.7 K/uL   Basophils Relative 1  0 - 1 %   Basophils Absolute 0.1  0.0 - 0.1 K/uL  COMPREHENSIVE METABOLIC PANEL      Result Value Ref Range   Sodium 135 (*) 137 - 147 mEq/L   Potassium 3.5 (*) 3.7 - 5.3 mEq/L   Chloride 102  96 -  112 mEq/L   CO2 15 (*) 19 - 32 mEq/L   Glucose, Bld 97  70 - 99 mg/dL   BUN 8  6 - 23 mg/dL   Creatinine, Ser 0.73  0.50 - 1.10 mg/dL   Calcium 9.3  8.4 - 10.5 mg/dL   Total Protein 6.8  6.0 - 8.3 g/dL   Albumin 2.3 (*) 3.5 - 5.2 g/dL   AST 21  0 - 37 U/L   ALT 6  0 - 35 U/L   Alkaline Phosphatase 59  39 - 117 U/L   Total Bilirubin 0.2 (*) 0.3 - 1.2 mg/dL   GFR calc non Af Amer 80 (*) >90 mL/min   GFR calc Af Amer >90  >90 mL/min  PRO B NATRIURETIC PEPTIDE      Result Value Ref Range   Pro B Natriuretic peptide (BNP) 5856.0 (*) 0 - 450 pg/mL  I-STAT TROPOININ, ED      Result Value Ref Range   Troponin i, poc 0.01  0.00 - 0.08 ng/mL   Comment 3            Dg Chest 2 View  05/07/2014   CLINICAL DATA:  Shortness of breath and chest pain.  EXAM: CHEST  2 VIEW  COMPARISON:  Chest radiograph May 01, 2014.  FINDINGS: Worsening retrocardiac consolidation, and small to moderate left pleural effusion, increased. The cardiac silhouette is likely mildly enlarged though partially obscured. Tortuous, possibly ectatic aorta with calcified aortic knob. Mild central pulmonary vasculature congestion, increased. No pneumothorax.  Surgical clips in the left neck may reflect thyroidectomy. Sub cm calcifications projecting at the proximal humerus bilaterally, unchanged. Osteopenia, with multilevel mid thoracic vertebral body cement augmentation. Surgical clips in the left abdomen.  IMPRESSION: Worsening retrocardiac consolidation of small to moderate left pleural effusion. Recommend followup chest radiograph after treatment to verify improvement.  Suspected mild cardiomegaly with central pulmonary vasculature congestion.   Electronically Signed   By: Elon Alas   On: 05/07/2014 00:26     EKG Interpretation   Date/Time:  Monday May 06 2014 23:39:41 EDT Ventricular Rate:  82 PR Interval:  149 QRS Duration: 87 QT Interval:  402 QTC Calculation: 469 R Axis:   20 Text Interpretation:  Sinus rhythm  Multiple ventricular premature  complexes Anterior infarct, old No significant change was found Confirmed  by CAMPOS  MD, Lennette Bihari ([id number]) on 05/07/2014 12:20:20 AM      MDM   Final diagnoses:  HCAP (healthcare-associated pneumonia)  Pleural effusion, left  Hypertension   The patient recently admitted for aspiration pneumonia, was discharged home refused SNF, present from home with a chief complaint of shortness of breath and persistent cough, reports partial symptom improvement after nebulizer treatment given by EMS.  1351: Patient oxygen saturation 94% RA are several minutes during encounter.  X-ray shows worsening consolidation and a small to moderate left pleural effusion. As well as central pulmonary vascularization. BNP 5856, no BNP and EMR to compare. The patient has had an echo 3/15 MR shows ECHO 02/2014: Normal LV size with mild LV hypertrophy. EF 55-60%. Normal RV size and systolic  function. Mild aortic stenosis by mean gradient, moderate by calculated valve area. Discussed with Dr. Hal Hope, agrees to admit the patient for further evaluation of pneumonia and pleural effusion.  Meds given in ED:  Medications  vancomycin (VANCOCIN) IVPB 1000 mg/200 mL premix (1,000 mg Intravenous New Bag/Given 05/07/14 0145)  piperacillin-tazobactam (ZOSYN) IVPB 3.375 g (0 g Intravenous Stopped 05/07/14 0143)    New Prescriptions   No medications on file      Lorrine Kin, PA-C 05/07/14 0215

## 2014-05-06 NOTE — ED Notes (Signed)
Brought in by EMS from home with c/o shortness of breath and chest discomfort. Pt reports that she was just discharged from hospital for aspiration pneumonia last Thursday.  Pt reports that she was taking Augmentin on discharge.  Pt started having chest discomfort and shortness of breath since this morning; states, "getting worse".  Pt was given Albuterol 5 mg neb tx en route to ED.

## 2014-05-06 NOTE — ED Notes (Signed)
Bed: WA10 Expected date:  Expected time:  Means of arrival:  Comments: EMS-SOB 

## 2014-05-07 ENCOUNTER — Inpatient Hospital Stay (HOSPITAL_COMMUNITY): Payer: Medicare Other

## 2014-05-07 ENCOUNTER — Encounter (HOSPITAL_COMMUNITY): Payer: Self-pay | Admitting: Internal Medicine

## 2014-05-07 DIAGNOSIS — R1013 Epigastric pain: Secondary | ICD-10-CM

## 2014-05-07 DIAGNOSIS — C160 Malignant neoplasm of cardia: Secondary | ICD-10-CM

## 2014-05-07 DIAGNOSIS — J690 Pneumonitis due to inhalation of food and vomit: Principal | ICD-10-CM

## 2014-05-07 DIAGNOSIS — J4489 Other specified chronic obstructive pulmonary disease: Secondary | ICD-10-CM

## 2014-05-07 DIAGNOSIS — J449 Chronic obstructive pulmonary disease, unspecified: Secondary | ICD-10-CM

## 2014-05-07 DIAGNOSIS — J9600 Acute respiratory failure, unspecified whether with hypoxia or hypercapnia: Secondary | ICD-10-CM

## 2014-05-07 DIAGNOSIS — J90 Pleural effusion, not elsewhere classified: Secondary | ICD-10-CM

## 2014-05-07 DIAGNOSIS — J189 Pneumonia, unspecified organism: Secondary | ICD-10-CM | POA: Diagnosis present

## 2014-05-07 LAB — URINALYSIS, ROUTINE W REFLEX MICROSCOPIC
BILIRUBIN URINE: NEGATIVE
Glucose, UA: NEGATIVE mg/dL
Hgb urine dipstick: NEGATIVE
KETONES UR: 15 mg/dL — AB
LEUKOCYTES UA: NEGATIVE
NITRITE: NEGATIVE
PH: 6 (ref 5.0–8.0)
Protein, ur: NEGATIVE mg/dL
Specific Gravity, Urine: 1.008 (ref 1.005–1.030)
UROBILINOGEN UA: 0.2 mg/dL (ref 0.0–1.0)

## 2014-05-07 LAB — COMPREHENSIVE METABOLIC PANEL
ALT: 6 U/L (ref 0–35)
AST: 18 U/L (ref 0–37)
AST: 21 U/L (ref 0–37)
Albumin: 2.3 g/dL — ABNORMAL LOW (ref 3.5–5.2)
Albumin: 2.4 g/dL — ABNORMAL LOW (ref 3.5–5.2)
Alkaline Phosphatase: 57 U/L (ref 39–117)
Alkaline Phosphatase: 59 U/L (ref 39–117)
BUN: 7 mg/dL (ref 6–23)
BUN: 8 mg/dL (ref 6–23)
CALCIUM: 9.6 mg/dL (ref 8.4–10.5)
CO2: 15 meq/L — AB (ref 19–32)
CO2: 21 mEq/L (ref 19–32)
CREATININE: 0.73 mg/dL (ref 0.50–1.10)
CREATININE: 0.82 mg/dL (ref 0.50–1.10)
Calcium: 9.3 mg/dL (ref 8.4–10.5)
Chloride: 101 mEq/L (ref 96–112)
Chloride: 102 mEq/L (ref 96–112)
GFR calc Af Amer: 78 mL/min — ABNORMAL LOW (ref >90)
GFR calc non Af Amer: 67 mL/min — ABNORMAL LOW (ref >90)
GFR calc non Af Amer: 80 mL/min — ABNORMAL LOW (ref >90)
GLUCOSE: 97 mg/dL (ref 70–99)
Glucose, Bld: 98 mg/dL (ref 70–99)
Potassium: 3.3 mEq/L — ABNORMAL LOW (ref 3.7–5.3)
Potassium: 3.5 mEq/L — ABNORMAL LOW (ref 3.7–5.3)
SODIUM: 138 meq/L (ref 137–147)
Sodium: 135 mEq/L — ABNORMAL LOW (ref 137–147)
TOTAL PROTEIN: 6.6 g/dL (ref 6.0–8.3)
TOTAL PROTEIN: 6.8 g/dL (ref 6.0–8.3)
Total Bilirubin: 0.2 mg/dL — ABNORMAL LOW (ref 0.3–1.2)
Total Bilirubin: 0.2 mg/dL — ABNORMAL LOW (ref 0.3–1.2)

## 2014-05-07 LAB — CBC WITH DIFFERENTIAL/PLATELET
BASOS PCT: 0 % (ref 0–1)
Basophils Absolute: 0 10*3/uL (ref 0.0–0.1)
EOS ABS: 0.2 10*3/uL (ref 0.0–0.7)
Eosinophils Relative: 1 % (ref 0–5)
HEMATOCRIT: 31.8 % — AB (ref 36.0–46.0)
Hemoglobin: 10 g/dL — ABNORMAL LOW (ref 12.0–15.0)
Lymphocytes Relative: 12 % (ref 12–46)
Lymphs Abs: 1.4 10*3/uL (ref 0.7–4.0)
MCH: 28.2 pg (ref 26.0–34.0)
MCHC: 31.4 g/dL (ref 30.0–36.0)
MCV: 89.8 fL (ref 78.0–100.0)
MONO ABS: 1.3 10*3/uL — AB (ref 0.1–1.0)
Monocytes Relative: 12 % (ref 3–12)
NEUTROS ABS: 8.3 10*3/uL — AB (ref 1.7–7.7)
Neutrophils Relative %: 75 % (ref 43–77)
Platelets: 464 10*3/uL — ABNORMAL HIGH (ref 150–400)
RBC: 3.54 MIL/uL — AB (ref 3.87–5.11)
RDW: 15.3 % (ref 11.5–15.5)
WBC: 11.1 10*3/uL — ABNORMAL HIGH (ref 4.0–10.5)

## 2014-05-07 LAB — BLOOD GAS, ARTERIAL
Acid-base deficit: 2.9 mmol/L — ABNORMAL HIGH (ref 0.0–2.0)
Bicarbonate: 20.4 mEq/L (ref 20.0–24.0)
Drawn by: 235321
O2 CONTENT: 2 L/min
O2 Saturation: 94.3 %
PCO2 ART: 31.6 mmHg — AB (ref 35.0–45.0)
PH ART: 7.424 (ref 7.350–7.450)
PO2 ART: 71.8 mmHg — AB (ref 80.0–100.0)
Patient temperature: 98.3
TCO2: 18.8 mmol/L (ref 0–100)

## 2014-05-07 LAB — PROTEIN, BODY FLUID: TOTAL PROTEIN, FLUID: 3.9 g/dL

## 2014-05-07 LAB — PRO B NATRIURETIC PEPTIDE: PRO B NATRI PEPTIDE: 5856 pg/mL — AB (ref 0–450)

## 2014-05-07 LAB — I-STAT TROPONIN, ED: Troponin i, poc: 0.01 ng/mL (ref 0.00–0.08)

## 2014-05-07 LAB — MRSA PCR SCREENING: MRSA by PCR: NEGATIVE

## 2014-05-07 LAB — LACTATE DEHYDROGENASE, PLEURAL OR PERITONEAL FLUID: LD, Fluid: 387 U/L — ABNORMAL HIGH (ref 3–23)

## 2014-05-07 MED ORDER — LORATADINE 10 MG PO TABS
10.0000 mg | ORAL_TABLET | Freq: Every day | ORAL | Status: DC
  Administered 2014-05-07 – 2014-05-13 (×7): 10 mg via ORAL
  Filled 2014-05-07 (×7): qty 1

## 2014-05-07 MED ORDER — SODIUM CHLORIDE 0.9 % IJ SOLN
3.0000 mL | Freq: Two times a day (BID) | INTRAMUSCULAR | Status: DC
  Administered 2014-05-08 – 2014-05-13 (×8): 3 mL via INTRAVENOUS

## 2014-05-07 MED ORDER — PIPERACILLIN-TAZOBACTAM 3.375 G IVPB
3.3750 g | Freq: Three times a day (TID) | INTRAVENOUS | Status: DC
  Administered 2014-05-07 – 2014-05-10 (×10): 3.375 g via INTRAVENOUS
  Filled 2014-05-07 (×12): qty 50

## 2014-05-07 MED ORDER — PANTOPRAZOLE SODIUM 40 MG PO TBEC
40.0000 mg | DELAYED_RELEASE_TABLET | Freq: Every day | ORAL | Status: DC
  Administered 2014-05-07 – 2014-05-13 (×7): 40 mg via ORAL
  Filled 2014-05-07 (×7): qty 1

## 2014-05-07 MED ORDER — LISINOPRIL 40 MG PO TABS
40.0000 mg | ORAL_TABLET | Freq: Every morning | ORAL | Status: DC
  Administered 2014-05-07: 40 mg via ORAL
  Filled 2014-05-07 (×2): qty 1

## 2014-05-07 MED ORDER — PIPERACILLIN-TAZOBACTAM 3.375 G IVPB 30 MIN
3.3750 g | Freq: Once | INTRAVENOUS | Status: AC
  Administered 2014-05-07: 3.375 g via INTRAVENOUS
  Filled 2014-05-07: qty 50

## 2014-05-07 MED ORDER — ACETAMINOPHEN 325 MG PO TABS
650.0000 mg | ORAL_TABLET | Freq: Four times a day (QID) | ORAL | Status: DC | PRN
  Administered 2014-05-07 – 2014-05-13 (×17): 650 mg via ORAL
  Filled 2014-05-07 (×19): qty 2

## 2014-05-07 MED ORDER — ATENOLOL 50 MG PO TABS
50.0000 mg | ORAL_TABLET | Freq: Two times a day (BID) | ORAL | Status: DC
  Administered 2014-05-07 – 2014-05-08 (×3): 50 mg via ORAL
  Filled 2014-05-07 (×4): qty 1

## 2014-05-07 MED ORDER — PIPERACILLIN-TAZOBACTAM 3.375 G IVPB: 3.3750 g | Freq: Once | INTRAVENOUS | Status: DC

## 2014-05-07 MED ORDER — AMLODIPINE BESYLATE 2.5 MG PO TABS
2.5000 mg | ORAL_TABLET | Freq: Every day | ORAL | Status: DC
  Administered 2014-05-07: 2.5 mg via ORAL
  Filled 2014-05-07 (×2): qty 1

## 2014-05-07 MED ORDER — BIOTENE DRY MOUTH MT LIQD
15.0000 mL | Freq: Two times a day (BID) | OROMUCOSAL | Status: DC
  Administered 2014-05-07 – 2014-05-13 (×11): 15 mL via OROMUCOSAL
  Filled 2014-05-07 (×2): qty 15

## 2014-05-07 MED ORDER — POLYVINYL ALCOHOL 1.4 % OP SOLN
1.0000 [drp] | Freq: Three times a day (TID) | OPHTHALMIC | Status: DC | PRN
  Filled 2014-05-07: qty 15

## 2014-05-07 MED ORDER — ALBUTEROL SULFATE HFA 108 (90 BASE) MCG/ACT IN AERS: 2.0000 | INHALATION_SPRAY | Freq: Four times a day (QID) | RESPIRATORY_TRACT | Status: DC | PRN

## 2014-05-07 MED ORDER — ONDANSETRON HCL 4 MG/2ML IJ SOLN
4.0000 mg | Freq: Four times a day (QID) | INTRAMUSCULAR | Status: DC | PRN
  Administered 2014-05-07 – 2014-05-08 (×2): 4 mg via INTRAVENOUS
  Filled 2014-05-07 (×2): qty 2

## 2014-05-07 MED ORDER — VANCOMYCIN HCL 500 MG IV SOLR
500.0000 mg | Freq: Two times a day (BID) | INTRAVENOUS | Status: DC
  Administered 2014-05-07 – 2014-05-10 (×6): 500 mg via INTRAVENOUS
  Filled 2014-05-07 (×7): qty 500

## 2014-05-07 MED ORDER — ACETAMINOPHEN 650 MG RE SUPP: 650.0000 mg | Freq: Four times a day (QID) | RECTAL | Status: DC | PRN

## 2014-05-07 MED ORDER — POTASSIUM CHLORIDE 10 MEQ/100ML IV SOLN
10.0000 meq | INTRAVENOUS | Status: AC
  Administered 2014-05-07 (×2): 10 meq via INTRAVENOUS
  Filled 2014-05-07 (×2): qty 100

## 2014-05-07 MED ORDER — GUAIFENESIN 100 MG/5ML PO SOLN
200.0000 mg | ORAL | Status: DC | PRN
  Administered 2014-05-07 – 2014-05-13 (×8): 200 mg via ORAL
  Filled 2014-05-07 (×2): qty 20
  Filled 2014-05-07 (×8): qty 10

## 2014-05-07 MED ORDER — VANCOMYCIN HCL IN DEXTROSE 1-5 GM/200ML-% IV SOLN
1000.0000 mg | Freq: Once | INTRAVENOUS | Status: AC
  Administered 2014-05-07: 1000 mg via INTRAVENOUS
  Filled 2014-05-07: qty 200

## 2014-05-07 MED ORDER — GUAIFENESIN-CODEINE 100-10 MG/5ML PO SOLN: 5.0000 mL | ORAL | Status: DC | PRN

## 2014-05-07 MED ORDER — SACCHAROMYCES BOULARDII 250 MG PO CAPS
250.0000 mg | ORAL_CAPSULE | Freq: Two times a day (BID) | ORAL | Status: DC
  Administered 2014-05-07 – 2014-05-13 (×13): 250 mg via ORAL
  Filled 2014-05-07 (×14): qty 1

## 2014-05-07 MED ORDER — LORAZEPAM 0.5 MG PO TABS
0.5000 mg | ORAL_TABLET | Freq: Three times a day (TID) | ORAL | Status: DC | PRN
  Administered 2014-05-07 – 2014-05-13 (×10): 0.5 mg via ORAL
  Filled 2014-05-07 (×11): qty 1

## 2014-05-07 MED ORDER — ALBUTEROL SULFATE (2.5 MG/3ML) 0.083% IN NEBU
2.5000 mg | INHALATION_SOLUTION | RESPIRATORY_TRACT | Status: DC | PRN
  Administered 2014-05-08 – 2014-05-10 (×3): 2.5 mg via RESPIRATORY_TRACT
  Filled 2014-05-07 (×3): qty 3

## 2014-05-07 MED ORDER — ENOXAPARIN SODIUM 40 MG/0.4ML ~~LOC~~ SOLN
40.0000 mg | SUBCUTANEOUS | Status: DC
  Administered 2014-05-07 – 2014-05-13 (×7): 40 mg via SUBCUTANEOUS
  Filled 2014-05-07 (×7): qty 0.4

## 2014-05-07 MED ORDER — FUROSEMIDE 10 MG/ML IJ SOLN
20.0000 mg | Freq: Once | INTRAMUSCULAR | Status: AC
  Administered 2014-05-07: 20 mg via INTRAVENOUS
  Filled 2014-05-07: qty 4

## 2014-05-07 MED ORDER — ONDANSETRON HCL 4 MG PO TABS: 4.0000 mg | ORAL_TABLET | Freq: Four times a day (QID) | ORAL | Status: DC | PRN

## 2014-05-07 MED ORDER — ASPIRIN EC 81 MG PO TBEC
81.0000 mg | DELAYED_RELEASE_TABLET | Freq: Every day | ORAL | Status: DC
  Administered 2014-05-07 – 2014-05-13 (×7): 81 mg via ORAL
  Filled 2014-05-07 (×7): qty 1

## 2014-05-07 MED ORDER — FUROSEMIDE 20 MG PO TABS
20.0000 mg | ORAL_TABLET | Freq: Every day | ORAL | Status: DC | PRN
  Filled 2014-05-07: qty 1

## 2014-05-07 MED ORDER — PREDNISONE 1 MG PO TABS
2.0000 mg | ORAL_TABLET | Freq: Every day | ORAL | Status: DC
  Administered 2014-05-07 – 2014-05-13 (×7): 2 mg via ORAL
  Filled 2014-05-07 (×8): qty 2

## 2014-05-07 MED ORDER — SALINE SPRAY 0.65 % NA SOLN
1.0000 | NASAL | Status: DC | PRN
  Filled 2014-05-07: qty 44

## 2014-05-07 MED ORDER — BUDESONIDE-FORMOTEROL FUMARATE 160-4.5 MCG/ACT IN AERO
2.0000 | INHALATION_SPRAY | Freq: Two times a day (BID) | RESPIRATORY_TRACT | Status: DC
  Administered 2014-05-07 – 2014-05-13 (×13): 2 via RESPIRATORY_TRACT
  Filled 2014-05-07: qty 6

## 2014-05-07 MED ORDER — TIZANIDINE HCL 2 MG PO TABS
2.0000 mg | ORAL_TABLET | Freq: Three times a day (TID) | ORAL | Status: DC | PRN
  Administered 2014-05-07 – 2014-05-08 (×2): 4 mg via ORAL
  Administered 2014-05-09 – 2014-05-12 (×3): 2 mg via ORAL
  Administered 2014-05-12: 4 mg via ORAL
  Administered 2014-05-13: 2 mg via ORAL
  Filled 2014-05-07 (×5): qty 2

## 2014-05-07 NOTE — Consult Note (Signed)
PULMONARY / CRITICAL CARE MEDICINE   Name: [name] MRN: [id number] DOB: Apr 26, 1934    ADMISSION DATE:  05/06/2014 CONSULTATION DATE: 6/2  REFERRING MD :  [facility] PRIMARY SERVICE: [facility]  CHIEF COMPLAINT:  dyspnea  BRIEF PATIENT DESCRIPTION:  80 yo former smoker (quit 20 years ago) hx of esophogeal adenocarcinoma (post resection 3 years ago per Dr. Arlyce Dice) who presents with 2 weeks of cough, malaise, 2 days of fever and radiographic evidence of LLL PNA, small to mod pleural effusion. She is hypoxic and is requiring supplemental oxygen. Due to the fact she has decline, despite treatment, she has been admitted and PCCM asked to consult.   SIGNIFICANT EVENTS / STUDIES:   LINES / TUBES:  CULTURES: 6/2 bc x 2>> 6/2 uc>>  ANTIBIOTICS: 6/2 vanc>> 6/2 pip-tazo>>  HISTORY OF PRESENT ILLNESS:   80 yo former smoker(quit 20 years ago)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) who presents with 2 weeks of cough, malaise, 2 days of fever and radiographic evidence of pneumonia. She is hypoxic and is requiring supplemental oxygen. Due to the fact she has decline, despite treatment, she has been admitted and PCCM asked to consult.   PAST MEDICAL HISTORY :  Past Medical History  Diagnosis Date  . Pernicious anemia   . Osteoarthritis   . Fibromyalgia   . Skin lesion     chronic calcific cutaneous lesions  . HX of multiple bleeding ulcers 09/01/2011  . Hypertension     takes Atenolol daily  . Emphysema   . Bronchitis     hx  . Adenocarcinoma of gastroesophageal junction   . Dizziness     d/t crystals in ears that "roll around"and can't get them out  . Back pain   . Osteoporosis   . Scoliosis   . Psoriasis   . H/O hiatal hernia   . GERD (gastroesophageal reflux disease)     takes Aciphex daily  . Gastric ulcer   . Constipation     since Chemo and Radiation;finished up in Nov 2012  . Diverticulosis   . History of colonic polyps   . Shingles     broke out 2wks ago;has been on meds and to finish up tomorrow  . Kidney stone 25+yrs ago  . Pernicious anemia   . Vitamin B deficiency   . Complication of anesthesia     shortness of breath and pain with gas   . COPD (chronic obstructive pulmonary disease)   . Shortness of breath     with exertion   . Blood transfusion    Past Surgical History  Procedure Laterality Date  . Right hip replacement  2001  . Tonsillectomy      as a child  . Tubal ligation  1975  . Appendectomy  1975  . Cataract surgery  10+yrs ago  . Esophagogastroduodenoscopy    . Colonoscopy    . Extubation  12/20/2011       . Partial esophagectomy  12/20/2011    Procedure: ESOPHAGECTOMY PARTIAL;  Surgeon: Pierre Bali, MD;  Location: [facility];  Service: Thoracic;  Laterality: N/A;  . Total hip arthroplasty  06/30/2012    Procedure: TOTAL HIP ARTHROPLASTY ANTERIOR APPROACH;  Surgeon: Mcarthur Rossetti, MD;  Location: [facility];  Service: Orthopedics;  Laterality: Left;  Left total hip arthroplasty   Prior to Admission medications   Medication Sig Start Date End Date Taking? Authorizing Provider  acetaminophen (TYLENOL) 500 MG tablet Take 500 mg by  mouth every 6 (six) hours as needed for mild pain or headache.   Yes Historical Provider, MD  acetic acid-hydrocortisone (VOSOL-HC) otic solution Place 1 drop into both ears 3 (three) times daily as needed (pain and itching).  04/17/14  Yes Historical Provider, MD  albuterol (PROVENTIL HFA;VENTOLIN HFA) 108 (90 BASE) MCG/ACT inhaler Inhale 2 puffs into the lungs every 6 (six) hours as needed for wheezing (wheezing).   Yes Historical Provider, MD  amLODipine (NORVASC) 2.5 MG tablet Take 1 tablet by mouth daily. 05/05/14  Yes Historical Provider, MD  amoxicillin-clavulanate (AUGMENTIN) 875-125 MG per tablet Take 1 tablet by mouth 2 (two) times daily. Take for 14 days beginning 05/02/14 and then discontinue. 05/02/14  Yes Modena Jansky, MD  aspirin EC 81 MG tablet Take 81 mg by  mouth daily.   Yes Historical Provider, MD  atenolol (TENORMIN) 50 MG tablet Take 50 mg by mouth 2 (two) times daily.   Yes Historical Provider, MD  budesonide-formoterol (SYMBICORT) 160-4.5 MCG/ACT inhaler Inhale 2 puffs into the lungs 2 (two) times daily.   Yes Historical Provider, MD  calcium-vitamin D (OSCAL WITH D) 500-200 MG-UNIT per tablet Take 1 tablet by mouth daily.   Yes Historical Provider, MD  Cholecalciferol (VITAMIN D3) 50000 UNITS CAPS Take 1 capsule by mouth every 14 (fourteen) days.    Yes Historical Provider, MD  esomeprazole (NEXIUM) 20 MG capsule Take 20 mg by mouth daily at 12 noon.   Yes Historical Provider, MD  furosemide (LASIX) 20 MG tablet Take 20 mg by mouth daily as needed for fluid or edema.  02/07/14  Yes Historical Provider, MD  hydroxypropyl methylcellulose (ISOPTO TEARS) 2.5 % ophthalmic solution Place 1 drop into both eyes 3 (three) times daily as needed for dry eyes.   Yes Historical Provider, MD  lisinopril (PRINIVIL,ZESTRIL) 40 MG tablet Take 40 mg by mouth every morning.    Yes Historical Provider, MD  loratadine (CLARITIN) 10 MG tablet Take 10 mg by mouth daily.   Yes Historical Provider, MD  LORazepam (ATIVAN) 0.5 MG tablet Take 1 tablet (0.5 mg total) by mouth every 8 (eight) hours as needed for anxiety. 05/02/14  Yes Modena Jansky, MD  predniSONE (DELTASONE) 1 MG tablet Take 2 mg by mouth daily with breakfast. Takes 2 tablets daily 03/02/13  Yes Historical Provider, MD  raloxifene (EVISTA) 60 MG tablet Take 60 mg by mouth every morning.    Yes Historical Provider, MD  saccharomyces boulardii (FLORASTOR) 250 MG capsule Take 1 capsule (250 mg total) by mouth 2 (two) times daily. 05/02/14  Yes Modena Jansky, MD  sodium chloride (OCEAN) 0.65 % SOLN nasal spray Place 1 spray into both nostrils as needed for congestion. 05/02/14  Yes Modena Jansky, MD  [facility]ORT PO Take 1 capsule by mouth 2 (two) times daily.   Yes Historical Provider, MD  tiZANidine  (ZANAFLEX) 4 MG tablet Take 2-4 mg by mouth every 8 (eight) hours as needed for muscle spasms.    Yes Historical Provider, MD  white petrolatum (VASELINE) GEL Apply 1 application topically as needed (anterior nares to prevent nose bleed). 05/02/14  Yes Modena Jansky, MD  diphenoxylate-atropine (LOMOTIL) 2.5-0.025 MG per tablet Take 1 tablet by mouth 4 (four) times daily as needed for diarrhea or loose stools. 05/02/14   Modena Jansky, MD   Allergies  Allergen Reactions  . Keflex [Cephalexin] Itching    "severe itching"  . Nulecit [Na Ferric Gluc Cplx In Sucrose]  Diarrhea, Nausea Only and Other (See Comments)      drop in BP  . Spiriva Handihaler [Tiotropium Bromide Monohydrate] Other (See Comments)    constipation  . Tramadol Itching    Skin burning and itching  . Adhesive [Tape] Itching  . Clarithromycin Itching  . Darvon Itching  . Epinephrine Itching  . Morphine And Related Itching  . Oxycodone Itching  . Sulfa Antibiotics Itching  . Vicodin [Hydrocodone-Acetaminophen] Itching    Can take with Benadryl    FAMILY HISTORY:  Family History  Problem Relation Age of Onset  . Cancer Maternal Grandmother     gynecologic  . Cancer Maternal Aunt     gynecologic  . Breast cancer Paternal Aunt   . Breast cancer Paternal Aunt   . Breast cancer Paternal Aunt   . Anesthesia problems Neg Hx   . Hypotension Neg Hx   . Malignant hyperthermia Neg Hx   . Pseudochol deficiency Neg Hx    SOCIAL HISTORY:  reports that she quit smoking about 22 years ago. Her smoking use included Cigarettes. She smoked 3.00 packs per day. She has never used smokeless tobacco. She reports that she does not drink alcohol or use illicit drugs.  REVIEW OF SYSTEMS:  10 point review of system taken, please see HPI for positives and negatives.   SUBJECTIVE:   VITAL SIGNS: Temp:  [97.8 F (36.6 C)-98.3 F (36.8 C)] 97.9 F (36.6 C) (06/02 0317) Pulse Rate:  [78-82] 78 (06/02 0230) Resp:  [18-23] 23  (06/02 0230) BP: (128-153)/(74-89) 153/89 mmHg (06/02 0317) SpO2:  [90 %-98 %] 96 % (06/02 0317) Weight:  [61 kg (134 lb 7.7 oz)-61.236 kg (135 lb)] 61 kg (134 lb 7.7 oz) (06/02 0317) HEMODYNAMICS:   VENTILATOR SETTINGS:   INTAKE / OUTPUT: Intake/Output     06/01 0701 - 06/02 0700 06/02 0701 - 06/03 0700   Urine (mL/kg/hr) 1100    Total Output 1100     Net -1100            PHYSICAL EXAMINATION: General:  Frail, wasted , arthritic, wf, nad at rest  Neuro:  Intact but lethargic HEENT:  No JVD/LAN Cardiovascular:  HSR RRR Lungs:  Diminished in bases, no overt wheeze Abdomen:  Mild tenderness, + bs Musculoskeletal:  Arthritic changes noted Skin:  Warm , dry, no edema  LABS:  CBC  Recent Labs Lab 05/01/14 0500 05/06/14 2345 05/07/14 0510  WBC 8.4 12.9* 11.1*  HGB 10.9* 10.2* 10.0*  HCT 35.2* 32.2* 31.8*  PLT 317 461* 464*   Coag's No results found for this basename: APTT, INR,  in the last 168 hours BMET  Recent Labs Lab 05/01/14 0500 05/06/14 2345 05/07/14 0510  NA 140 135* 138  K 4.1 3.5* 3.3*  CL 104 102 101  CO2 23 15* 21  BUN 9 8 7   CREATININE 1.03 0.73 0.82  GLUCOSE 93 97 98   Electrolytes  Recent Labs Lab 05/01/14 0500 05/06/14 2345 05/07/14 0510  CALCIUM 9.4 9.3 9.6   Sepsis Markers No results found for this basename: LATICACIDVEN, PROCALCITON, O2SATVEN,  in the last 168 hours ABG  Recent Labs Lab 05/07/14 0240  PHART 7.424  PCO2ART 31.6*  PO2ART 71.8*   Liver Enzymes  Recent Labs Lab 05/06/14 2345 05/07/14 0510  AST 21 18  ALT 6 <5  ALKPHOS 59 57  BILITOT 0.2* 0.2*  ALBUMIN 2.3* 2.4*   Cardiac Enzymes  Recent Labs Lab 05/06/14 2345  PROBNP 5856.0*   Glucose  No results found for this basename: GLUCAP,  in the last 168 hours  Imaging Dg Chest 2 View  05/07/2014   CLINICAL DATA:  Shortness of breath and chest pain.  EXAM: CHEST  2 VIEW  COMPARISON:  Chest radiograph May 01, 2014.  FINDINGS: Worsening retrocardiac  consolidation, and small to moderate left pleural effusion, increased. The cardiac silhouette is likely mildly enlarged though partially obscured. Tortuous, possibly ectatic aorta with calcified aortic knob. Mild central pulmonary vasculature congestion, increased. No pneumothorax.  Surgical clips in the left neck may reflect thyroidectomy. Sub cm calcifications projecting at the proximal humerus bilaterally, unchanged. Osteopenia, with multilevel mid thoracic vertebral body cement augmentation. Surgical clips in the left abdomen.  IMPRESSION: Worsening retrocardiac consolidation of small to moderate left pleural effusion. Recommend followup chest radiograph after treatment to verify improvement.  Suspected mild cardiomegaly with central pulmonary vasculature congestion.   Electronically Signed   By: Elon Alas   On: 05/07/2014 00:26       ASSESSMENT / PLAN:  PULMONARY A: Hypoxic resp failure in setting of COPD.  PNA (presumed aspiration due to gastric pull-up), HCAP L effusion P:   O2 as needed Agree with continuing her chronic steroid dose in absence bronchospasm BD Abx, currently covering HCAP Suspect she would benefit from L thora. May need to perform repeat Ct scan chest AFTER the thora to see if there is an evolving lung abscess Swallowing and aspiration precautions  CARDIOVASCULAR A: HTN P:  Antihypertensives Consider recheck 2 d echo  RENAL A:  No acute issue P:     GASTROINTESTINAL A:  Solid dysphagia due to surgery P:   Thin diet  HEMATOLOGIC A:  Hx of adenoCA of ge junction. Followed by Dr. [name]. P:  Monitor  INFECTIOUS A:  Presumed aspiration pna / HCAP At risk L parapneumonic effusion vs empyema At risk lung abscess P:   vanco + pip-tazo L thora as above Consider CT chest after the thora, if one is done Follow cx data  ENDOCRINE A:  No acute issue  P:     NEUROLOGIC A:  No acute issue P:   RASS goal:0   TODAY'S SUMMARY: 79 hx  esophageal CA, resection w gastric pull-up, chronic aspiration. LLL PNA with recurrence / failure to resolve. L effusion will likely need thora.    Richardson Landry Minor ACNP Maryanna Shape PCCM Pager [phone] till 3 pm If no answer page [phone] 05/07/2014, 7:40 AM   I have personally obtained a history, examined the patient, evaluated laboratory and imaging results, formulated the assessment and plan and placed orders.    Baltazar Apo, MD, PhD 05/07/2014, 11:17 AM [facility] [phone] or if no answer [phone]

## 2014-05-07 NOTE — Progress Notes (Signed)
[facility] ADULT ICU REPLACEMENT PROTOCOL FOR AM LAB REPLACEMENT ONLY  The patient does apply for the [facility] Adult ICU Electrolyte Replacment Protocol based on the criteria listed below:   1. Is GFR >/= 40 ml/min? yes  Patient's GFR today is 67 2. Is urine output >/= 0.5 ml/kg/hr for the last 6 hours? yes Patient's UOP is 2.1 ml/kg/hr 3. Is BUN < 60 mg/dL? yes  Patient's BUN today is 7 4. Abnormal electrolyte(s): K 3.3 5. Ordered repletion with: per protocol 6. If a panic level lab has been reported, has the CCM MD in charge been notified? no.   Physician:    Billy Fischer 05/07/2014 6:07 AM

## 2014-05-07 NOTE — Progress Notes (Signed)
Patient ID: [name], female   DOB: 08/18/1934, 80 y.o.   MRN: [id number] [facility] PROGRESS NOTE  CIRA DEYOE ELF:[id number] DOB: Jan 26, 1934 DOA: 05/06/2014 PCP: [name], MD  Brief narrative: 80 yo former smoker (quit 20 years ago) hx of esophogeal adenocarcinoma (post resection 3 years ago per Dr. Arlyce Dice) who presents with 2 weeks of cough, malaise, 2 days of fever and radiographic evidence of LLL PNA, small to mod pleural effusion, associated with hypoxia and requiring supplemental oxygen. Due to the fact she has declined, despite treatment, she has been admitted and PCCM asked to consult.   Active Problems:   Acute hypoxic respiratory failure in the setting of COPD  - PNA (presumed aspiration due to gastric pull-up), HCAP, L effusion  - continuing chronic steroids, oxygen as needed, BD's  - continue broad spectrum ABX Vancomycin and Zosyn  - plan for left thoracentesis per PCCM   Leukocytosis  - secondary to HCAP, ABX as noted above and repeat CBC in AM   Adenocarcinoma of gastroesophageal junction - follows with Dr. [name], will send him a note to alert him pt has been hospitalized    Hypertension - continue Norvasc, Atenolol, Lisinopril  - placed order for 2 D ECHO    COPD (chronic obstructive pulmonary disease)  - oxygen as needed, BD's, chronic steroids    Hypokalemia - supplement and repeat BMP in AM   Anemia of chronic disease - Hg and Hct stable and at baseline - no signs of active bleeding - Lovenox for DVT prophylaxis    Severe malnutrition - in the setting of acute illness - encourage PO intake   Consultants:  PCCM  Procedures/Studies: Dg Chest 2 View   05/07/2014   Worsening retrocardiac consolidation of small to moderate left pleural effusion. Recommend followup chest radiograph after treatment to verify improvement.  Suspected mild cardiomegaly with central pulmonary vasculature congestion.   Antibiotics:  Vancomycin 6/2  -->  Zosyn 6/2 -->  Code Status: Full Family Communication: Pt at bedside Disposition Plan: Home when medically stable  HPI/Subjective: No events overnight.   Objective: Filed Vitals:   05/07/14 0050 05/07/14 0230 05/07/14 0317 05/07/14 0800  BP:  143/75 153/89 155/62  Pulse:  78  76  Temp:   97.9 F (36.6 C)   TempSrc:   Oral   Resp:  23  22  Height: 5\' 5"  (1.651 m)  5\' 4"  (1.626 m)   Weight: 61.236 kg (135 lb)  61 kg (134 lb 7.7 oz)   SpO2:  90% 96% 97%    Intake/Output Summary (Last 24 hours) at 05/07/14 0835 Last data filed at 05/07/14 0800  Gross per 24 hour  Intake    310 ml  Output   1100 ml  Net   -790 ml    Exam:   General:  Pt is alert, follows commands appropriately, frail but NAD  Cardiovascular: Regular rate and rhythm, no rubs, no gallops  Respiratory: Poor inspiratory effort, diminished air movement bilaterally with no wheezing   Abdomen: Soft, non tender, non distended, bowel sounds present, no guarding  Extremities: No edema, pulses DP and PT palpable bilaterally  Data Reviewed: Basic Metabolic Panel:  Recent Labs Lab 05/01/14 0500 05/06/14 2345 05/07/14 0510  NA 140 135* 138  K 4.1 3.5* 3.3*  CL 104 102 101  CO2 23 15* 21  GLUCOSE 93 97 98  BUN 9 8 7   CREATININE 1.03 0.73 0.82  CALCIUM 9.4 9.3 9.6  Liver Function Tests:  Recent Labs Lab 05/06/14 2345 05/07/14 0510  AST 21 18  ALT 6 <5  ALKPHOS 59 57  BILITOT 0.2* 0.2*  PROT 6.8 6.6  ALBUMIN 2.3* 2.4*   CBC:  Recent Labs Lab 05/01/14 0500 05/06/14 2345 05/07/14 0510  WBC 8.4 12.9* 11.1*  NEUTROABS  --  9.8* 8.3*  HGB 10.9* 10.2* 10.0*  HCT 35.2* 32.2* 31.8*  MCV 91.2 90.4 89.8  PLT 317 461* 464*    Recent Results (from the past 240 hour(s))  CLOSTRIDIUM DIFFICILE BY PCR     Status: None   Collection Time    04/29/14  9:37 PM      Result Value Ref Range Status   C difficile by pcr NEGATIVE  NEGATIVE Final   Comment: Performed at [facility]   MRSA PCR SCREENING     Status: None   Collection Time    05/07/14  3:14 AM      Result Value Ref Range Status   MRSA by PCR NEGATIVE  NEGATIVE Final   Comment:            The GeneXpert MRSA Assay (FDA     approved for NASAL specimens     only), is one component of a     comprehensive MRSA colonization     surveillance program. It is not     intended to diagnose MRSA     infection nor to guide or     monitor treatment for     MRSA infections.     Scheduled Meds: . amLODipine  2.5 mg Oral Daily  . aspirin EC  81 mg Oral Daily  . atenolol  50 mg Oral BID  . budesonide-formoterol  2 puff Inhalation BID  . enoxaparin injection  40 mg Subcutaneous Q24H  . lisinopril  40 mg Oral q morning - 10a  . loratadine  10 mg Oral Daily  . pantoprazole  40 mg Oral Daily  . ZOSYN  IV  3.375 g Intravenous 3 times per day  . potassium chloride  10 mEq Intravenous Q1 Hr x 2  . predniSONE  2 mg Oral Q breakfast  . vancomycin  500 mg Intravenous Q12H   Continuous Infusions:  Theodis Blaze, MD  [facility] Pager [phone]  If 7PM-7AM, please contact night-coverage [email or web address] Password [facility] 05/07/2014, 8:35 AM   LOS: 1 day

## 2014-05-07 NOTE — Progress Notes (Signed)
ANTIBIOTIC CONSULT NOTE - INITIAL  Pharmacy Consult for Vancomycin and Zosyn  Indication: pneumonia  Allergies  Allergen Reactions  . Keflex [Cephalexin] Itching    "severe itching"  . Nulecit [Na Ferric Gluc Cplx In Sucrose] Diarrhea, Nausea Only and Other (See Comments)      drop in BP  . Spiriva Handihaler [Tiotropium Bromide Monohydrate] Other (See Comments)    constipation  . Tramadol Itching    Skin burning and itching  . Adhesive [Tape] Itching  . Clarithromycin Itching  . Darvon Itching  . Epinephrine Itching  . Morphine And Related Itching  . Oxycodone Itching  . Sulfa Antibiotics Itching  . Vicodin [Hydrocodone-Acetaminophen] Itching    Can take with Benadryl    Patient Measurements: Height: 5\' 4"  (162.6 cm) Weight: 134 lb 7.7 oz (61 kg) IBW/kg (Calculated) : 54.7 Adjusted Body Weight:   Vital Signs: Temp: 97.9 F (36.6 C) (06/02 0317) Temp src: Oral (06/02 0317) BP: 153/89 mmHg (06/02 0317) Pulse Rate: 78 (06/02 0230) Intake/Output from previous day:   Intake/Output from this shift:    Labs:  Recent Labs  05/06/14 2345  WBC 12.9*  HGB 10.2*  PLT 461*  CREATININE 0.73   Estimated Creatinine Clearance: 50.9 ml/min (by C-G formula based on Cr of 0.73). No results found for this basename: VANCOTROUGH, Corlis Leak, VANCORANDOM, GENTTROUGH, GENTPEAK, GENTRANDOM, Hardy, TOBRAPEAK, TOBRARND, AMIKACINPEAK, AMIKACINTROU, AMIKACIN,  in the last 72 hours   Microbiology: Recent Results (from the past 720 hour(s))  CULTURE, BLOOD (ROUTINE X 2)     Status: None   Collection Time    04/25/14  2:07 PM      Result Value Ref Range Status   Specimen Description BLOOD LEFT ARM   Final   Special Requests BOTTLES DRAWN AEROBIC AND ANAEROBIC 5ML   Final   Culture  Setup Time     Final   Value: 04/25/2014 21:51     Performed at [facility]   Culture     Final   Value: NO GROWTH 5 DAYS     Performed at [facility]   Report Status 05/01/2014  FINAL   Final  CULTURE, BLOOD (ROUTINE X 2)     Status: None   Collection Time    04/25/14  2:12 PM      Result Value Ref Range Status   Specimen Description BLOOD RIGHT ARM   Final   Special Requests BOTTLES DRAWN AEROBIC AND ANAEROBIC 5ML   Final   Culture  Setup Time     Final   Value: 04/25/2014 21:52     Performed at [facility]   Culture     Final   Value: DIPHTHEROIDS(CORYNEBACTERIUM SPECIES)     Note: Standardized susceptibility testing for this organism is not available.     Note: Gram Stain Report Called to,Read Back By and Verified With: Dawayne Patricia 05/01/14 AT 0240 RIDK     Performed at [facility]   Report Status 05/02/2014 FINAL   Final  URINE CULTURE     Status: None   Collection Time    04/25/14  4:16 PM      Result Value Ref Range Status   Specimen Description URINE, CLEAN CATCH   Final   Special Requests NONE   Final   Culture  Setup Time     Final   Value: 04/26/2014 00:42     Performed at [place]     Final  Value: NO GROWTH     Performed at [facility]     Final   Value: NO GROWTH     Performed at [facility]   Report Status 04/27/2014 FINAL   Final  CULTURE, EXPECTORATED SPUTUM-ASSESSMENT     Status: None   Collection Time    04/26/14  7:32 AM      Result Value Ref Range Status   Specimen Description SPUTUM   Final   Special Requests Immunocompromised   Final   Sputum evaluation     Final   Value: THIS SPECIMEN IS ACCEPTABLE. RESPIRATORY CULTURE REPORT TO FOLLOW.   Report Status 04/26/2014 FINAL   Final  CULTURE, RESPIRATORY (NON-EXPECTORATED)     Status: None   Collection Time    04/26/14  7:32 AM      Result Value Ref Range Status   Specimen Description SPUTUM   Final   Special Requests NONE   Final   Gram Stain     Final   Value: ABUNDANT WBC PRESENT,BOTH PMN AND MONONUCLEAR     NO SQUAMOUS EPITHELIAL CELLS SEEN     MODERATE GRAM POSITIVE COCCI IN PAIRS     Performed  at [facility]   Culture     Final   Value: FEW CANDIDA ALBICANS     Performed at [facility]   Report Status 04/28/2014 FINAL   Final  CLOSTRIDIUM DIFFICILE BY PCR     Status: None   Collection Time    04/29/14  9:37 PM      Result Value Ref Range Status   C difficile by pcr NEGATIVE  NEGATIVE Final   Comment: Performed at [place] History: Past Medical History  Diagnosis Date  . Pernicious anemia   . Osteoarthritis   . Fibromyalgia   . Skin lesion     chronic calcific cutaneous lesions  . HX of multiple bleeding ulcers 09/01/2011  . Hypertension     takes Atenolol daily  . Emphysema   . Bronchitis     hx  . Adenocarcinoma of gastroesophageal junction   . Dizziness     d/t crystals in ears that "roll around"and can't get them out  . Back pain   . Osteoporosis   . Scoliosis   . Psoriasis   . H/O hiatal hernia   . GERD (gastroesophageal reflux disease)     takes Aciphex daily  . Gastric ulcer   . Constipation     since Chemo and Radiation;finished up in Nov 2012  . Diverticulosis   . History of colonic polyps   . Shingles     broke out 2wks ago;has been on meds and to finish up tomorrow  . Kidney stone 25+yrs ago  . Pernicious anemia   . Vitamin B deficiency   . Complication of anesthesia     shortness of breath and pain with gas   . COPD (chronic obstructive pulmonary disease)   . Shortness of breath     with exertion   . Blood transfusion     Medications:  Anti-infectives   Start     Dose/Rate Route Frequency Ordered Stop   05/07/14 1800  vancomycin (VANCOCIN) 500 mg in sodium chloride 0.9 % 100 mL IVPB     500 mg 100 mL/hr over 60 Minutes Intravenous Every 12 hours 05/07/14 0333     05/07/14 0600  piperacillin-tazobactam (ZOSYN) IVPB 3.375 g  3.375 g 12.5 mL/hr over 240 Minutes Intravenous 3 times per day 05/07/14 0333     05/07/14 0100  piperacillin-tazobactam (ZOSYN) IVPB 3.375 g     3.375 g 100 mL/hr over  30 Minutes Intravenous  Once 05/07/14 0045 05/07/14 0143   05/07/14 0045  vancomycin (VANCOCIN) IVPB 1000 mg/200 mL premix     1,000 mg 200 mL/hr over 60 Minutes Intravenous  Once 05/07/14 0043 05/07/14 0245   05/07/14 0045  piperacillin-tazobactam (ZOSYN) IVPB 3.375 g  Status:  Discontinued     3.375 g 12.5 mL/hr over 240 Minutes Intravenous  Once 05/07/14 0043 05/07/14 0045     Assessment: Patient with PNA.  First dose of antibiotics already given   Goal of Therapy:  Vancomycin trough level 15-20 mcg/ml Zosyn based on renal function   Plan:  Measure antibiotic drug levels at steady state Follow up culture results Vancomycin 500mg  iv q12hr Zosyn 3.375g IV Q8H infused over 4hrs.   Jacquilyn Seldon [employer]. 05/07/2014,3:35 AM

## 2014-05-07 NOTE — H&P (Signed)
[facility] History and Physical  [name] VWU:[id number] DOB: 08-22-1934 DOA: 05/06/2014  Referring physician: ER physician. PCP: [name], MD   Chief Complaint: Shortness of breath.  HPI: [name] is a 80 y.o. female with history of adenocarcinoma of the GE junction who was just admitted for pneumonia presents to the ER because of worsening shortness of breath with productive cough and weakness. Patient states she has not been able to eat well because of nausea and coughing. Denies any abdominal pain. Has been having persistent diarrhea. In the ER chest x-ray shows worsening of the pneumonia from prior. Patient has been started on empiric antibiotics and admitted for further management. Patient otherwise denies any chest pain abdominal pain. In the ER patient has required at least 4 L of nasal cannula oxygen to maintain saturation.   Review of Systems: As presented in the history of presenting illness, rest negative.  Past Medical History  Diagnosis Date  . Pernicious anemia   . Osteoarthritis   . Fibromyalgia   . Skin lesion     chronic calcific cutaneous lesions  . HX of multiple bleeding ulcers 09/01/2011  . Hypertension     takes Atenolol daily  . Emphysema   . Bronchitis     hx  . Adenocarcinoma of gastroesophageal junction   . Dizziness     d/t crystals in ears that "roll around"and can't get them out  . Back pain   . Osteoporosis   . Scoliosis   . Psoriasis   . H/O hiatal hernia   . GERD (gastroesophageal reflux disease)     takes Aciphex daily  . Gastric ulcer   . Constipation     since Chemo and Radiation;finished up in Nov 2012  . Diverticulosis   . History of colonic polyps   . Shingles     broke out 2wks ago;has been on meds and to finish up tomorrow  . Kidney stone 25+yrs ago  . Pernicious anemia   . Vitamin B deficiency   . Complication of anesthesia     shortness of breath and pain with gas   . COPD (chronic obstructive  pulmonary disease)   . Shortness of breath     with exertion   . Blood transfusion    Past Surgical History  Procedure Laterality Date  . Right hip replacement  2001  . Tonsillectomy      as a child  . Tubal ligation  1975  . Appendectomy  1975  . Cataract surgery  10+yrs ago  . Esophagogastroduodenoscopy    . Colonoscopy    . Extubation  12/20/2011       . Partial esophagectomy  12/20/2011    Procedure: ESOPHAGECTOMY PARTIAL;  Surgeon: Pierre Bali, MD;  Location: [facility];  Service: Thoracic;  Laterality: N/A;  . Total hip arthroplasty  06/30/2012    Procedure: TOTAL HIP ARTHROPLASTY ANTERIOR APPROACH;  Surgeon: Mcarthur Rossetti, MD;  Location: [facility];  Service: Orthopedics;  Laterality: Left;  Left total hip arthroplasty   Social History:  reports that she quit smoking about 22 years ago. Her smoking use included Cigarettes. She smoked 3.00 packs per day. She has never used smokeless tobacco. She reports that she does not drink alcohol or use illicit drugs. Where does patient live home. Can patient participate in ADLs? Yes.  Allergies  Allergen Reactions  . Keflex [Cephalexin] Itching    "severe itching"  . Nulecit [Na Ferric Gluc Cplx In Sucrose] Diarrhea,  Nausea Only and Other (See Comments)      drop in BP  . Spiriva Handihaler [Tiotropium Bromide Monohydrate] Other (See Comments)    constipation  . Tramadol Itching    Skin burning and itching  . Adhesive [Tape] Itching  . Clarithromycin Itching  . Darvon Itching  . Epinephrine Itching  . Morphine And Related Itching  . Oxycodone Itching  . Sulfa Antibiotics Itching  . Vicodin [Hydrocodone-Acetaminophen] Itching    Can take with Benadryl    Family History:  Family History  Problem Relation Age of Onset  . Cancer Maternal Grandmother     gynecologic  . Cancer Maternal Aunt     gynecologic  . Breast cancer Paternal Aunt   . Breast cancer Paternal Aunt   . Breast cancer Paternal Aunt   . Anesthesia  problems Neg Hx   . Hypotension Neg Hx   . Malignant hyperthermia Neg Hx   . Pseudochol deficiency Neg Hx       Prior to Admission medications   Medication Sig Start Date End Date Taking? Authorizing Provider  acetaminophen (TYLENOL) 500 MG tablet Take 500 mg by mouth every 6 (six) hours as needed for mild pain or headache.   Yes Historical Provider, MD  acetic acid-hydrocortisone (VOSOL-HC) otic solution Place 1 drop into both ears 3 (three) times daily as needed (pain and itching).  04/17/14  Yes Historical Provider, MD  albuterol (PROVENTIL HFA;VENTOLIN HFA) 108 (90 BASE) MCG/ACT inhaler Inhale 2 puffs into the lungs every 6 (six) hours as needed for wheezing (wheezing).   Yes Historical Provider, MD  amLODipine (NORVASC) 2.5 MG tablet Take 1 tablet by mouth daily. 05/05/14  Yes Historical Provider, MD  amoxicillin-clavulanate (AUGMENTIN) 875-125 MG per tablet Take 1 tablet by mouth 2 (two) times daily. Take for 14 days beginning 05/02/14 and then discontinue. 05/02/14  Yes Modena Jansky, MD  aspirin EC 81 MG tablet Take 81 mg by mouth daily.   Yes Historical Provider, MD  atenolol (TENORMIN) 50 MG tablet Take 50 mg by mouth 2 (two) times daily.   Yes Historical Provider, MD  budesonide-formoterol (SYMBICORT) 160-4.5 MCG/ACT inhaler Inhale 2 puffs into the lungs 2 (two) times daily.   Yes Historical Provider, MD  calcium-vitamin D (OSCAL WITH D) 500-200 MG-UNIT per tablet Take 1 tablet by mouth daily.   Yes Historical Provider, MD  Cholecalciferol (VITAMIN D3) 50000 UNITS CAPS Take 1 capsule by mouth every 14 (fourteen) days.    Yes Historical Provider, MD  esomeprazole (NEXIUM) 20 MG capsule Take 20 mg by mouth daily at 12 noon.   Yes Historical Provider, MD  furosemide (LASIX) 20 MG tablet Take 20 mg by mouth daily as needed for fluid or edema.  02/07/14  Yes Historical Provider, MD  hydroxypropyl methylcellulose (ISOPTO TEARS) 2.5 % ophthalmic solution Place 1 drop into both eyes 3 (three)  times daily as needed for dry eyes.   Yes Historical Provider, MD  lisinopril (PRINIVIL,ZESTRIL) 40 MG tablet Take 40 mg by mouth every morning.    Yes Historical Provider, MD  loratadine (CLARITIN) 10 MG tablet Take 10 mg by mouth daily.   Yes Historical Provider, MD  LORazepam (ATIVAN) 0.5 MG tablet Take 1 tablet (0.5 mg total) by mouth every 8 (eight) hours as needed for anxiety. 05/02/14  Yes Modena Jansky, MD  predniSONE (DELTASONE) 1 MG tablet Take 2 mg by mouth daily with breakfast. Takes 2 tablets daily 03/02/13  Yes Historical Provider, MD  raloxifene (EVISTA)  60 MG tablet Take 60 mg by mouth every morning.    Yes Historical Provider, MD  saccharomyces boulardii (FLORASTOR) 250 MG capsule Take 1 capsule (250 mg total) by mouth 2 (two) times daily. 05/02/14  Yes Modena Jansky, MD  sodium chloride (OCEAN) 0.65 % SOLN nasal spray Place 1 spray into both nostrils as needed for congestion. 05/02/14  Yes Modena Jansky, MD  [facility] WORT PO Take 1 capsule by mouth 2 (two) times daily.   Yes Historical Provider, MD  tiZANidine (ZANAFLEX) 4 MG tablet Take 2-4 mg by mouth every 8 (eight) hours as needed for muscle spasms.    Yes Historical Provider, MD  white petrolatum (VASELINE) GEL Apply 1 application topically as needed (anterior nares to prevent nose bleed). 05/02/14  Yes Modena Jansky, MD  diphenoxylate-atropine (LOMOTIL) 2.5-0.025 MG per tablet Take 1 tablet by mouth 4 (four) times daily as needed for diarrhea or loose stools. 05/02/14   Modena Jansky, MD    Physical Exam: Filed Vitals:   05/06/14 2339 05/07/14 0025 05/07/14 0050  BP: 128/74 128/74   Pulse:  82   Temp: 97.8 F (36.6 C) 98.3 F (36.8 C)   TempSrc: Oral Oral   Resp: 21 18   Height:   5\' 5"  (1.651 m)  Weight:   61.236 kg (135 lb)  SpO2: 98% 98%      General:  Well-developed and moderately nourished.  Eyes: Anicteric no pallor.  ENT: No discharge from the ears eyes nose mouth.  Neck: No mass  felt.  Cardiovascular: S1-S2 heard.  Respiratory: No rhonchi or crepitations.  Abdomen: Soft nontender bowel sounds present. No guarding rigidity.  Skin: No rash.  Musculoskeletal: No edema.  Psychiatric: Appears normal.  Neurologic: A oriented to time place and person. Moves all extremities.  Labs on Admission:  Basic Metabolic Panel:  Recent Labs Lab 04/30/14 0345 05/01/14 0500 05/06/14 2345  NA 140 140 135*  K 4.0 4.1 3.5*  CL 104 104 102  CO2 24 23 15*  GLUCOSE 89 93 97  BUN 9 9 8   CREATININE 0.96 1.03 0.73  CALCIUM 9.8 9.4 9.3   Liver Function Tests:  Recent Labs Lab 05/06/14 2345  AST 21  ALT 6  ALKPHOS 59  BILITOT 0.2*  PROT 6.8  ALBUMIN 2.3*   No results found for this basename: LIPASE, AMYLASE,  in the last 168 hours No results found for this basename: AMMONIA,  in the last 168 hours CBC:  Recent Labs Lab 04/30/14 0345 05/01/14 0500 05/06/14 2345  WBC 8.1 8.4 12.9*  NEUTROABS  --   --  9.8*  HGB 11.5* 10.9* 10.2*  HCT 37.0 35.2* 32.2*  MCV 92.3 91.2 90.4  PLT 349 317 461*   Cardiac Enzymes: No results found for this basename: CKTOTAL, CKMB, CKMBINDEX, TROPONINI,  in the last 168 hours  BNP (last 3 results)  Recent Labs  05/06/14 2345  PROBNP 5856.0*   CBG: No results found for this basename: GLUCAP,  in the last 168 hours  Radiological Exams on Admission: Dg Chest 2 View  05/07/2014   CLINICAL DATA:  Shortness of breath and chest pain.  EXAM: CHEST  2 VIEW  COMPARISON:  Chest radiograph May 01, 2014.  FINDINGS: Worsening retrocardiac consolidation, and small to moderate left pleural effusion, increased. The cardiac silhouette is likely mildly enlarged though partially obscured. Tortuous, possibly ectatic aorta with calcified aortic knob. Mild central pulmonary vasculature congestion, increased. No pneumothorax.  Surgical  clips in the left neck may reflect thyroidectomy. Sub cm calcifications projecting at the proximal humerus  bilaterally, unchanged. Osteopenia, with multilevel mid thoracic vertebral body cement augmentation. Surgical clips in the left abdomen.  IMPRESSION: Worsening retrocardiac consolidation of small to moderate left pleural effusion. Recommend followup chest radiograph after treatment to verify improvement.  Suspected mild cardiomegaly with central pulmonary vasculature congestion.   Electronically Signed   By: Elon Alas   On: 05/07/2014 00:26     Assessment/Plan Active Problems:   Pernicious anemia   Adenocarcinoma of gastroesophageal junction   Hypertension   COPD (chronic obstructive pulmonary disease)   Acute respiratory failure with hypoxia   HCAP (healthcare-associated pneumonia)   Acute respiratory failure   1. Acute respiratory failure - probably secondary to combination of pneumonia and possible fluid overload. I have placed patient on vancomycin and Zosyn for pneumonia and have ordered one dose of Lasix 20 mg IV. Closely follow her respiratory status in the step down unit. I have consulted pulmonary critical care. 2. Metabolic acidosis - check ABG. Check lactic acid levels. Closely follow metabolic panel. 3. COPD - presently not wheezing. On chronic low-dose steroids. 4. History of adenocarcinoma of the GE junction status post radiation surgery and chemotherapy in remission - per oncologist. 5. History of pernicious anemia - follow CBC. 6. Hypertension - continue present medications.    Code Status: Full code.  Family Communication: None.  Disposition Plan: Admit to inpatient.    [facility] Pager [phone].  If 7PM-7AM, please contact night-coverage [email or web address] Password [facility] 05/07/2014, 2:32 AM

## 2014-05-07 NOTE — Procedures (Signed)
US guided diagnostic/therapeutic left thoracentesis performed yielding 600 cc's slightly turbid, amber fluid. The fluid was sent to the lab for preordered studies. F/u CXR pending. No immediate complications.

## 2014-05-08 ENCOUNTER — Inpatient Hospital Stay (HOSPITAL_COMMUNITY): Payer: Medicare Other

## 2014-05-08 DIAGNOSIS — I359 Nonrheumatic aortic valve disorder, unspecified: Secondary | ICD-10-CM

## 2014-05-08 LAB — BASIC METABOLIC PANEL
BUN: 8 mg/dL (ref 6–23)
CHLORIDE: 102 meq/L (ref 96–112)
CO2: 23 mEq/L (ref 19–32)
Calcium: 9.3 mg/dL (ref 8.4–10.5)
Creatinine, Ser: 0.82 mg/dL (ref 0.50–1.10)
GFR calc non Af Amer: 67 mL/min — ABNORMAL LOW (ref >90)
GFR, EST AFRICAN AMERICAN: 78 mL/min — AB (ref >90)
Glucose, Bld: 104 mg/dL — ABNORMAL HIGH (ref 70–99)
Potassium: 3.4 mEq/L — ABNORMAL LOW (ref 3.7–5.3)
SODIUM: 137 meq/L (ref 137–147)

## 2014-05-08 MED ORDER — SODIUM CHLORIDE 0.9 % IV BOLUS (SEPSIS)
1000.0000 mL | Freq: Once | INTRAVENOUS | Status: AC
  Administered 2014-05-08: 1000 mL via INTRAVENOUS

## 2014-05-08 MED ORDER — TRAMADOL HCL 50 MG PO TABS
50.0000 mg | ORAL_TABLET | Freq: Four times a day (QID) | ORAL | Status: DC | PRN
  Administered 2014-05-08 – 2014-05-13 (×11): 50 mg via ORAL
  Filled 2014-05-08 (×12): qty 1

## 2014-05-08 NOTE — Progress Notes (Signed)
  Echocardiogram 2D Echocardiogram has been performed.  [name] 05/08/2014, 4:21 PM

## 2014-05-08 NOTE — Progress Notes (Signed)
PULMONARY / CRITICAL CARE MEDICINE   Name: [name] MRN: [id number] DOB: [birth year]    ADMISSION DATE:  05/06/2014 CONSULTATION DATE: 6/2  REFERRING MD :  [facility] PRIMARY SERVICE: [facility]  CHIEF COMPLAINT:  dyspnea  BRIEF PATIENT DESCRIPTION:  80 yo former smoker (quit 20 years ago) hx of esophogeal adenocarcinoma (post resection 3 years ago per Dr. Arlyce Dice) who presents with 2 weeks of cough, malaise, 2 days of fever and radiographic evidence of LLL PNA, small to mod pleural effusion. She is hypoxic and is requiring supplemental oxygen. Due to the fact she has decline, despite treatment, she has been admitted and PCCM asked to consult.   SIGNIFICANT EVENTS / STUDIES:  6/2 left  thora per IR 600 cc obtained   LINES / TUBES:  CULTURES: 6/2 bc x 2>> 6/2 uc>> 6/3 pleural fluid>>  ANTIBIOTICS: 6/2 vanc>> 6/2 pip-tazo>>  HISTORY OF PRESENT ILLNESS:   80 yo former smoker(quit 20 years ago)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) who presents with 2 weeks of cough, malaise, 2 days of fever and radiographic evidence of pneumonia. She is hypoxic and is requiring supplemental oxygen. Due to the fact she has decline, despite treatment, she has been admitted and PCCM asked to consult.   SUBJECTIVE:  Feels better VITAL SIGNS: Temp:  [97.6 F (36.4 C)-98.1 F (36.7 C)] 97.8 F (36.6 C) (06/03 0412) Pulse Rate:  [66-78] 70 (06/03 0932) Resp:  [18-24] 24 (06/03 0412) BP: (69-139)/(41-65) 113/57 mmHg (06/03 0932) SpO2:  [93 %-98 %] 97 % (06/03 0910) HEMODYNAMICS:   VENTILATOR SETTINGS:   INTAKE / OUTPUT: Intake/Output     06/02 0701 - 06/03 0700 06/03 0701 - 06/04 0700   P.O. 742 240   I.V. (mL/kg) 110 (1.8)    IV Piggyback 400    Total Intake(mL/kg) 1252 (20.5) 240 (3.9)   Urine (mL/kg/hr) 525 (0.4)    Total Output 525     Net +727 +240        Urine Occurrence  1 x   Stool Occurrence 1 x 1 x     PHYSICAL EXAMINATION: General:   Frail, wasted , arthritic, wf, nad at rest  Neuro:  Intact but lethargic HEENT:  No JVD/LAN Cardiovascular:  HSR RRR Lungs:  Diminished in bases, no overt wheeze, increased air left base Abdomen:  Mild tenderness, + bs Musculoskeletal:  Arthritic changes noted Skin:  Warm , dry, no edema  LABS:  CBC  Recent Labs Lab 05/06/14 2345 05/07/14 0510  WBC 12.9* 11.1*  HGB 10.2* 10.0*  HCT 32.2* 31.8*  PLT 461* 464*   Coag's No results found for this basename: APTT, INR,  in the last 168 hours BMET  Recent Labs Lab 05/06/14 2345 05/07/14 0510 05/08/14 0507  NA 135* 138 137  K 3.5* 3.3* 3.4*  CL 102 101 102  CO2 15* 21 23  BUN 8 7 8   CREATININE 0.73 0.82 0.82  GLUCOSE 97 98 104*   Electrolytes  Recent Labs Lab 05/06/14 2345 05/07/14 0510 05/08/14 0507  CALCIUM 9.3 9.6 9.3   Sepsis Markers No results found for this basename: LATICACIDVEN, PROCALCITON, O2SATVEN,  in the last 168 hours ABG  Recent Labs Lab 05/07/14 0240  PHART 7.424  PCO2ART 31.6*  PO2ART 71.8*   Liver Enzymes  Recent Labs Lab 05/06/14 2345 05/07/14 0510  AST 21 18  ALT 6 <5  ALKPHOS 59 57  BILITOT 0.2* 0.2*  ALBUMIN 2.3* 2.4*   Cardiac Enzymes  Recent Labs Lab 05/06/14 2345  PROBNP 5856.0*   Glucose No results found for this basename: GLUCAP,  in the last 168 hours  Imaging Dg Chest 1 View  05/07/2014   CLINICAL DATA:  Post left-sided thoracentesis  EXAM: CHEST - 1 VIEW  COMPARISON:  05/25/2014; 04/25/2014; left-sided ultrasound-guided thoracentesis - earlier same day  FINDINGS: Grossly unchanged enlarged cardiac silhouette and mediastinal contours. Interval reduction in small residual left-sided pleural effusion and post thoracentesis. No pneumothorax. The lungs remain hyperexpanded. Improved aeration of the left lower lung with persistent bibasilar heterogeneous opacities, left greater than right. No new focal airspace opacities. Unchanged bones. Model scoliotic curvature of  the thoracolumbar spine. Post multilevel vertebroplasty / kyphoplasty, incompletely evaluated.  IMPRESSION: 1. Interval reduction in residual small left-sided pleural effusion post thoracentesis. No pneumothorax. 2. Improved aeration of lungs with persistent bibasilar opacities, left greater than right, atelectasis versus infiltrate.   Electronically Signed   By: Sandi Mariscal M.D.   On: 05/07/2014 16:29   Dg Chest 2 View  05/07/2014   CLINICAL DATA:  Shortness of breath and chest pain.  EXAM: CHEST  2 VIEW  COMPARISON:  Chest radiograph May 01, 2014.  FINDINGS: Worsening retrocardiac consolidation, and small to moderate left pleural effusion, increased. The cardiac silhouette is likely mildly enlarged though partially obscured. Tortuous, possibly ectatic aorta with calcified aortic knob. Mild central pulmonary vasculature congestion, increased. No pneumothorax.  Surgical clips in the left neck may reflect thyroidectomy. Sub cm calcifications projecting at the proximal humerus bilaterally, unchanged. Osteopenia, with multilevel mid thoracic vertebral body cement augmentation. Surgical clips in the left abdomen.  IMPRESSION: Worsening retrocardiac consolidation of small to moderate left pleural effusion. Recommend followup chest radiograph after treatment to verify improvement.  Suspected mild cardiomegaly with central pulmonary vasculature congestion.   Electronically Signed   By: Elon Alas   On: 05/07/2014 00:26   US Thoracentesis Asp Pleural Space W/img Guide  05/07/2014   CLINICAL DATA:  Patient with past history of esophageal adenocarcinoma; now with pneumonia, left pleural effusion, posterior left chest discomfort, dyspnea. Request is made for diagnostic and therapeutic left thoracentesis  EXAM: ULTRASOUND GUIDED DIAGNOSTIC AND THERAPEUTIC LEFT THORACENTESIS  COMPARISON:  None.  PROCEDURE: An ultrasound guided thoracentesis was thoroughly discussed with the patient and questions answered. The  benefits, risks, alternatives and complications were also discussed. The patient understands and wishes to proceed with the procedure. Written consent was obtained.  Ultrasound was performed to localize and mark an adequate pocket of fluid in the left chest. The area was then prepped and draped in the normal sterile fashion. 1% Lidocaine was used for local anesthesia. Under ultrasound guidance a 19 gauge Yueh catheter was introduced. Thoracentesis was performed. The catheter was removed and a dressing applied.  Complications:  None.  FINDINGS: A total of approximately 600 cc's of slightly turbid, amber fluid was removed. The fluid sample wassent for laboratory analysis.  IMPRESSION: Successful ultrasound guided diagnostic and therapeutic left thoracentesis yielding 600 cc's of pleural fluid.  Read by: Rowe Bethanne Mule ,P.A.-C.   Electronically Signed   By: Sandi Mariscal M.D.   On: 05/07/2014 16:11       ASSESSMENT / PLAN:  PULMONARY A: Hypoxic resp failure in setting of COPD.  PNA (presumed aspiration due to gastric pull-up), HCAP L effusion P:   O2 as needed Agree with continuing her chronic steroid dose in absence bronchospasm BD Abx, currently covering HCAP Suspect she would benefit from L thora. Will perform repeat Ct  scan chest AFTER the thora to see if there is an evolving lung abscess.  ordered Swallowing and aspiration precautions  CARDIOVASCULAR A: HTN P:  Antihypertensives Consider recheck 2 d echo  RENAL A:  No acute issue P:     GASTROINTESTINAL A:  Solid dysphagia due to surgery P:   Thin diet  HEMATOLOGIC A:  Hx of adenoCA of ge junction. Followed by Dr. [name]. P:  Monitor  INFECTIOUS A:  Presumed aspiration pna / HCAP At risk L parapneumonic effusion vs empyema At risk lung abscess P:   vanco + pip-tazo L thora as above Order CT chest after the thora, Follow cx data  ENDOCRINE A:  No acute issue  P:     NEUROLOGIC A:  No acute issue P:   RASS  goal:0   TODAY'S SUMMARY: 79 hx esophageal CA, resection w gastric pull-up, chronic aspiration. LLL PNA with recurrence / failure to resolve. L effusion post thora 6/2. 6/3 limited ct chest ordered 6/3   [facility] ACNP Maryanna Shape PCCM Pager [phone] till 3 pm If no answer page [phone] 05/08/2014, 12:07 PM   Baltazar Apo, MD, PhD 05/08/2014, 5:48 PM [facility] [phone] or if no answer [phone]

## 2014-05-08 NOTE — Progress Notes (Signed)
Patient ID: [name], female   DOB: 04-26-1934, 80 y.o.   MRN: [id number] [facility] PROGRESS NOTE  [name] GQQ:[id number] DOB: 22-Apr-1934 DOA: 05/06/2014 PCP: [name], MD  Brief narrative: 80 yo former smoker (quit 20 years ago) hx of esophogeal adenocarcinoma (post resection 3 years ago per Dr. Arlyce Dice) who presents with 2 weeks of cough, malaise, 2 days of fever and radiographic evidence of LLL PNA, small to mod pleural effusion, associated with hypoxia and requiring supplemental oxygen. Due to the fact she has declined, despite treatment, she has been admitted and PCCM asked to consult.   Active Problems:   Acute hypoxic respiratory failure in the setting of COPD  - PNA (presumed aspiration due to gastric pull-up), HCAP, L effusion  - continuing chronic steroids, oxygen as needed, BD's  - continue broad spectrum ABX Vancomycin and Zosyn  - had thoracentesis yesterday with removal of 600 cc. -incentive spirometry. -plan for CT chest today per pulmonary recs.    Leukocytosis  - secondary to HCAP, ABX as noted above and repeat CBC in AM -Improving.    Adenocarcinoma of gastroesophageal junction - follows with Dr. [name], will send him a note to alert him pt has been hospitalized     Hypertension -Hypotensive today with BP of 70/40. -Hold all BP meds. -Saline bolus.     COPD (chronic obstructive pulmonary disease)  - oxygen as needed, BD's, chronic steroids  -Will try to wean oxygen as tolerated.    Hypokalemia - supplement and repeat BMP in AM -Continue to replete.    Anemia of chronic disease - Hg and Hct stable and at baseline - no signs of active bleeding - Lovenox for DVT prophylaxis     Severe malnutrition - in the setting of acute illness - encourage PO intake   Consultants:  PCCM  Procedures/Studies: Dg Chest 2 View   05/07/2014   Worsening retrocardiac consolidation of small to moderate left pleural effusion. Recommend followup  chest radiograph after treatment to verify improvement.  Suspected mild cardiomegaly with central pulmonary vasculature congestion.   Antibiotics:  Vancomycin 6/2 -->  Zosyn 6/2 -->  Code Status: Full Family Communication: Pt at bedside Disposition Plan: Home when medically stable  HPI/Subjective: No events overnight.   Objective: Filed Vitals:   05/08/14 0820 05/08/14 0838 05/08/14 0910 05/08/14 0932  BP: 69/41 81/45  113/57  Pulse: 71 66  70  Temp:      TempSrc:      Resp:      Height:      Weight:      SpO2:  96% 97%     Intake/Output Summary (Last 24 hours) at 05/08/14 1244 Last data filed at 05/08/14 0900  Gross per 24 hour  Intake   1232 ml  Output    300 ml  Net    932 ml    Exam:   General:  Pt is alert, follows commands appropriately, frail but NAD  Cardiovascular: Regular rate and rhythm, no rubs, no gallops  Respiratory: Poor inspiratory effort, diminished air movement bilaterally with no wheezing   Abdomen: Soft, non tender, non distended, bowel sounds present, no guarding  Extremities: No edema, pulses DP and PT palpable bilaterally  Data Reviewed: Basic Metabolic Panel:  Recent Labs Lab 05/06/14 2345 05/07/14 0510 05/08/14 0507  NA 135* 138 137  K 3.5* 3.3* 3.4*  CL 102 101 102  CO2 15* 21 23  GLUCOSE 97 98 104*  BUN 8 7  8  CREATININE 0.73 0.82 0.82  CALCIUM 9.3 9.6 9.3   Liver Function Tests:  Recent Labs Lab 05/06/14 2345 05/07/14 0510  AST 21 18  ALT 6 <5  ALKPHOS 59 57  BILITOT 0.2* 0.2*  PROT 6.8 6.6  ALBUMIN 2.3* 2.4*   CBC:  Recent Labs Lab 05/06/14 2345 05/07/14 0510  WBC 12.9* 11.1*  NEUTROABS 9.8* 8.3*  HGB 10.2* 10.0*  HCT 32.2* 31.8*  MCV 90.4 89.8  PLT 461* 464*    Recent Results (from the past 240 hour(s))  CLOSTRIDIUM DIFFICILE BY PCR     Status: None   Collection Time    04/29/14  9:37 PM      Result Value Ref Range Status   C difficile by pcr NEGATIVE  NEGATIVE Final   Comment: Performed  at [place], BLOOD (ROUTINE X 2)     Status: None   Collection Time    05/07/14 12:55 AM      Result Value Ref Range Status   Specimen Description BLOOD LEFT HAND   Final   Special Requests BOTTLES DRAWN AEROBIC AND ANAEROBIC 5CC   Final   Culture  Setup Time     Final   Value: 05/07/2014 04:13     Performed at [facility]   Culture     Final   Value:        BLOOD CULTURE RECEIVED NO GROWTH TO DATE CULTURE WILL BE HELD FOR 5 DAYS BEFORE ISSUING A FINAL NEGATIVE REPORT     Performed at [facility]   Report Status PENDING   Incomplete  CULTURE, BLOOD (ROUTINE X 2)     Status: None   Collection Time    05/07/14  1:00 AM      Result Value Ref Range Status   Specimen Description BLOOD RIGHT ANTECUBITAL   Final   Special Requests BOTTLES DRAWN AEROBIC AND ANAEROBIC 5CC   Final   Culture  Setup Time     Final   Value: 05/07/2014 04:13     Performed at [facility]   Culture     Final   Value:        BLOOD CULTURE RECEIVED NO GROWTH TO DATE CULTURE WILL BE HELD FOR 5 DAYS BEFORE ISSUING A FINAL NEGATIVE REPORT     Performed at [facility]   Report Status PENDING   Incomplete  MRSA PCR SCREENING     Status: None   Collection Time    05/07/14  3:14 AM      Result Value Ref Range Status   MRSA by PCR NEGATIVE  NEGATIVE Final   Comment:            The GeneXpert MRSA Assay (FDA     approved for NASAL specimens     only), is one component of a     comprehensive MRSA colonization     surveillance program. It is not     intended to diagnose MRSA     infection nor to guide or     monitor treatment for     MRSA infections.  BODY FLUID CULTURE     Status: None   Collection Time    05/07/14  4:06 PM      Result Value Ref Range Status   Specimen Description PLEURAL   Final   Special Requests NONE   Final   Gram Stain     Final   Value: RARE WBC PRESENT,  PREDOMINANTLY PMN     NO ORGANISMS SEEN     Performed at [facility]    Culture     Final   Value: NO GROWTH     Performed at [facility]   Report Status PENDING   Incomplete     Scheduled Meds: . amLODipine  2.5 mg Oral Daily  . aspirin EC  81 mg Oral Daily  . atenolol  50 mg Oral BID  . budesonide-formoterol  2 puff Inhalation BID  . enoxaparin injection  40 mg Subcutaneous Q24H  . lisinopril  40 mg Oral q morning - 10a  . loratadine  10 mg Oral Daily  . pantoprazole  40 mg Oral Daily  . ZOSYN  IV  3.375 g Intravenous 3 times per day  . potassium chloride  10 mEq Intravenous Q1 Hr x 2  . predniSONE  2 mg Oral Q breakfast  . vancomycin  500 mg Intravenous Q12H   Continuous Infusions:  Erline Hau, MD  [facility] Pager [phone]  If 7PM-7AM, please contact night-coverage [email or web address] Password [email or web address] 05/08/2014, 12:44 PM   LOS: 2 days

## 2014-05-09 LAB — BASIC METABOLIC PANEL
BUN: 6 mg/dL (ref 6–23)
CO2: 23 mEq/L (ref 19–32)
CREATININE: 0.87 mg/dL (ref 0.50–1.10)
Calcium: 9.3 mg/dL (ref 8.4–10.5)
Chloride: 106 mEq/L (ref 96–112)
GFR calc Af Amer: 73 mL/min — ABNORMAL LOW (ref >90)
GFR, EST NON AFRICAN AMERICAN: 63 mL/min — AB (ref >90)
Glucose, Bld: 98 mg/dL (ref 70–99)
POTASSIUM: 3.5 meq/L — AB (ref 3.7–5.3)
Sodium: 141 mEq/L (ref 137–147)

## 2014-05-09 LAB — CBC
HCT: 30.8 % — ABNORMAL LOW (ref 36.0–46.0)
Hemoglobin: 9.4 g/dL — ABNORMAL LOW (ref 12.0–15.0)
MCH: 27.8 pg (ref 26.0–34.0)
MCHC: 30.5 g/dL (ref 30.0–36.0)
MCV: 91.1 fL (ref 78.0–100.0)
PLATELETS: 547 10*3/uL — AB (ref 150–400)
RBC: 3.38 MIL/uL — ABNORMAL LOW (ref 3.87–5.11)
RDW: 15.4 % (ref 11.5–15.5)
WBC: 8.8 10*3/uL (ref 4.0–10.5)

## 2014-05-09 LAB — VANCOMYCIN, TROUGH: VANCOMYCIN TR: 15.6 ug/mL (ref 10.0–20.0)

## 2014-05-09 MED ORDER — AMLODIPINE BESYLATE 2.5 MG PO TABS
2.5000 mg | ORAL_TABLET | Freq: Every day | ORAL | Status: DC
  Administered 2014-05-09 – 2014-05-13 (×5): 2.5 mg via ORAL
  Filled 2014-05-09 (×5): qty 1

## 2014-05-09 MED ORDER — IBUPROFEN 400 MG PO TABS
400.0000 mg | ORAL_TABLET | Freq: Three times a day (TID) | ORAL | Status: DC | PRN
  Administered 2014-05-09 – 2014-05-12 (×6): 400 mg via ORAL
  Filled 2014-05-09 (×7): qty 1

## 2014-05-09 MED ORDER — ATENOLOL 50 MG PO TABS
50.0000 mg | ORAL_TABLET | Freq: Two times a day (BID) | ORAL | Status: DC
  Administered 2014-05-09 – 2014-05-13 (×9): 50 mg via ORAL
  Filled 2014-05-09 (×11): qty 1

## 2014-05-09 MED ORDER — POTASSIUM CHLORIDE CRYS ER 20 MEQ PO TBCR
40.0000 meq | EXTENDED_RELEASE_TABLET | Freq: Once | ORAL | Status: AC
  Administered 2014-05-09: 40 meq via ORAL
  Filled 2014-05-09: qty 2

## 2014-05-09 NOTE — Progress Notes (Signed)
PULMONARY / CRITICAL CARE MEDICINE   Name: [name] MRN: [id number] DOB: [birth year]    ADMISSION DATE:  05/06/2014 CONSULTATION DATE: 6/2  REFERRING MD :  [facility] PRIMARY SERVICE: [facility]  CHIEF COMPLAINT:  dyspnea  BRIEF PATIENT DESCRIPTION:  80 yo former smoker (quit 20 years ago) hx of esophogeal adenocarcinoma (post resection 3 years ago per Dr. Arlyce Dice) who presents with 2 weeks of cough, malaise, 2 days of fever and radiographic evidence of LLL PNA, small to mod pleural effusion. She is hypoxic and is requiring supplemental oxygen. Due to the fact she has decline, despite treatment, she has been admitted and PCCM asked to consult.   SIGNIFICANT EVENTS / STUDIES:  6/2 left  thora per IR 600 cc obtained   LINES / TUBES:  CULTURES: 6/2 bc x 2>> 6/2 uc>> 6/3 pleural fluid>>  ANTIBIOTICS: 6/2 vanc>> 6/2 pip-tazo>>  HISTORY OF PRESENT ILLNESS:   80 yo former smoker(quit 20 years ago)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) who presents with 2 weeks of cough, malaise, 2 days of fever and radiographic evidence of pneumonia. She is hypoxic and is requiring supplemental oxygen. Due to the fact she has decline, despite treatment, she has been admitted and PCCM asked to consult.   SUBJECTIVE:  " I'm 20% of my baseline".  VITAL SIGNS: Temp:  [97.9 F (36.6 C)-98.3 F (36.8 C)] 97.9 F (36.6 C) (06/04 5784) Pulse Rate:  [80-94] 94 (06/04 0614) Resp:  [18-20] 18 (06/04 0614) BP: (135-162)/(63-82) 141/72 mmHg (06/04 0614) SpO2:  [90 %-98 %] 96 % (06/04 0810) Weight:  [62.3 kg (137 lb 5.6 oz)] 62.3 kg (137 lb 5.6 oz) (06/04 0614) HEMODYNAMICS:   VENTILATOR SETTINGS:   INTAKE / OUTPUT: Intake/Output     06/03 0701 - 06/04 0700 06/04 0701 - 06/05 0700   P.O. 720    I.V. (mL/kg)     IV Piggyback 300 50   Total Intake(mL/kg) 1020 (16.4) 50 (0.8)   Urine (mL/kg/hr) 450 (0.3)    Total Output 450     Net +570 +50        Urine Occurrence  2 x    Stool Occurrence 2 x      PHYSICAL EXAMINATION: General:  Frail, wasted , arthritic, wf, nad at rest  Neuro:  Intact less lethargic HEENT:  No JVD/LAN Cardiovascular:  HSR RRR Lungs:  Diminished in bases, no overt wheeze, increased air left base Abdomen:  Mild tenderness, + bs Musculoskeletal:  Arthritic changes noted Skin:  Warm , dry, no edema  LABS:  CBC  Recent Labs Lab 05/06/14 2345 05/07/14 0510 05/09/14 0503  WBC 12.9* 11.1* 8.8  HGB 10.2* 10.0* 9.4*  HCT 32.2* 31.8* 30.8*  PLT 461* 464* 547*   Coag's No results found for this basename: APTT, INR,  in the last 168 hours BMET  Recent Labs Lab 05/07/14 0510 05/08/14 0507 05/09/14 0503  NA 138 137 141  K 3.3* 3.4* 3.5*  CL 101 102 106  CO2 21 23 23   BUN 7 8 6   CREATININE 0.82 0.82 0.87  GLUCOSE 98 104* 98   Electrolytes  Recent Labs Lab 05/07/14 0510 05/08/14 0507 05/09/14 0503  CALCIUM 9.6 9.3 9.3   Sepsis Markers No results found for this basename: LATICACIDVEN, PROCALCITON, O2SATVEN,  in the last 168 hours ABG  Recent Labs Lab 05/07/14 0240  PHART 7.424  PCO2ART 31.6*  PO2ART 71.8*   Liver Enzymes  Recent Labs Lab 05/06/14 2345 05/07/14  0510  AST 21 18  ALT 6 <5  ALKPHOS 59 57  BILITOT 0.2* 0.2*  ALBUMIN 2.3* 2.4*   Cardiac Enzymes  Recent Labs Lab 05/06/14 2345  PROBNP 5856.0*   Glucose No results found for this basename: GLUCAP,  in the last 168 hours  Imaging Dg Chest 1 View  05/07/2014   CLINICAL DATA:  Post left-sided thoracentesis  EXAM: CHEST - 1 VIEW  COMPARISON:  05/25/2014; 04/25/2014; left-sided ultrasound-guided thoracentesis - earlier same day  FINDINGS: Grossly unchanged enlarged cardiac silhouette and mediastinal contours. Interval reduction in small residual left-sided pleural effusion and post thoracentesis. No pneumothorax. The lungs remain hyperexpanded. Improved aeration of the left lower lung with persistent bibasilar heterogeneous opacities,  left greater than right. No new focal airspace opacities. Unchanged bones. Model scoliotic curvature of the thoracolumbar spine. Post multilevel vertebroplasty / kyphoplasty, incompletely evaluated.  IMPRESSION: 1. Interval reduction in residual small left-sided pleural effusion post thoracentesis. No pneumothorax. 2. Improved aeration of lungs with persistent bibasilar opacities, left greater than right, atelectasis versus infiltrate.   Electronically Signed   By: Sandi Mariscal M.D.   On: 05/07/2014 16:29   Ct Chest Wo Contrast  05/08/2014   CLINICAL DATA:  80 year old female with history of esophageal adenocarcinoma resected 3 years ago. Two weeks of cough, malacia is, fever, abnormal left lower lobe on chest radiograph. Initial encounter.  EXAM: CT CHEST WITHOUT CONTRAST  TECHNIQUE: Multidetector CT imaging of the chest was performed following the standard protocol without IV contrast.  COMPARISON:  Portable chest radiograph 05/07/2014. Chest CTA 04/25/2014.  FINDINGS: Sequelae of gastric pull-through. Negative visualized thoracic inlet. Stable small mediastinal lymph nodes. No lymphadenopathy is identified in the absence of IV contrast. Noncontrast visible upper abdominal viscera appear stable. Extensive aortic calcified atherosclerosis. Extensive coronary artery calcified atherosclerosis.  No pericardial effusion. Moderate layering bilateral pleural effusions, slightly greater on the left.  Major airways are patent. There is compressive atelectasis of the right lower lobe. There is greater opacification of the left lower lobe with some air bronchograms (series 5, image 32). Small hyperdense foci in might reflect calcifications in the collapsed or consolidated lower lobes, uncertain.  Centrilobular in occasional paraseptal emphysema re- identified in the upper lobes which are stable. Mild opacity in the lingula. The right middle lobe is clear.  Multilevel augmented thoracic compression fractures re- identified.  Stable visualized osseous structures. No destructive or suspicious osseous lesion identified.  IMPRESSION: 1. Moderate layering pleural effusions greater on the left. Airspace opacification in the left lower lobe has decreased since the 04/25/2014 CTA, but lower lobe consolidation remains. On the right there is mainly compressive atelectasis. 2. Otherwise stable chest status post gastric pull-through.   Electronically Signed   By: Lars Pinks M.D.   On: 05/08/2014 14:59   US Thoracentesis Asp Pleural Space W/img Guide  05/07/2014   CLINICAL DATA:  Patient with past history of esophageal adenocarcinoma; now with pneumonia, left pleural effusion, posterior left chest discomfort, dyspnea. Request is made for diagnostic and therapeutic left thoracentesis  EXAM: ULTRASOUND GUIDED DIAGNOSTIC AND THERAPEUTIC LEFT THORACENTESIS  COMPARISON:  None.  PROCEDURE: An ultrasound guided thoracentesis was thoroughly discussed with the patient and questions answered. The benefits, risks, alternatives and complications were also discussed. The patient understands and wishes to proceed with the procedure. Written consent was obtained.  Ultrasound was performed to localize and mark an adequate pocket of fluid in the left chest. The area was then prepped and draped in the normal sterile fashion.  1% Lidocaine was used for local anesthesia. Under ultrasound guidance a 19 gauge Yueh catheter was introduced. Thoracentesis was performed. The catheter was removed and a dressing applied.  Complications:  None.  FINDINGS: A total of approximately 600 cc's of slightly turbid, amber fluid was removed. The fluid sample wassent for laboratory analysis.  IMPRESSION: Successful ultrasound guided diagnostic and therapeutic left thoracentesis yielding 600 cc's of pleural fluid.  Read by: Rowe Yitzhak Awan ,P.A.-C.   Electronically Signed   By: Sandi Mariscal M.D.   On: 05/07/2014 16:11       ASSESSMENT / PLAN:  PULMONARY A: Hypoxic resp failure in  setting of COPD.  PNA (presumed aspiration due to gastric pull-up), HCAP > infiltrate improved on 6/3 CT, no evidence necrosis or abscess L > R effusion P:   O2 as needed Agree with continuing her chronic steroid dose in absence bronchospasm BD Abx, currently covering HCAP Swallowing and aspiration precautions  CARDIOVASCULAR A: HTN P:  Antihypertensives Consider recheck 2 d echo  RENAL A:  No acute issue P:     GASTROINTESTINAL A:  Solid dysphagia due to surgery P:   Thin diet  HEMATOLOGIC A:  Hx of adenoCA of ge junction. Followed by Dr. [name]. P:  Monitor  INFECTIOUS A:  Presumed aspiration pna / HCAP L parapneumonic effusion vs empyema > cx negative to date At risk lung abscess > none seen on CT 6/3 P:   vanco + pip-tazo Follow cx data  ENDOCRINE A:  No acute issue  P:     NEUROLOGIC A:  No acute issue P:   RASS goal:0   TODAY'S SUMMARY: 79 hx esophageal CA, resection w gastric pull-up, chronic aspiration. LLL PNA with recurrence / failure to resolve. L effusion post thora 6/2. 6/3 limited ct chest ordered 6/3   [facility] ACNP Maryanna Shape PCCM Pager [phone] till 3 pm If no answer page [phone] 05/09/2014, 11:23 AM   Baltazar Apo, MD, PhD 05/09/2014, 12:46 PM [facility] [phone] or if no answer [phone]

## 2014-05-09 NOTE — Progress Notes (Signed)
ANTIBIOTIC CONSULT NOTE - follow up  Pharmacy Consult for Vancomycin and Zosyn  Indication: pneumonia  Allergies  Allergen Reactions  . Keflex [Cephalexin] Itching    "severe itching"  . Nulecit [Na Ferric Gluc Cplx In Sucrose] Diarrhea, Nausea Only and Other (See Comments)      drop in BP  . Spiriva Handihaler [Tiotropium Bromide Monohydrate] Other (See Comments)    constipation  . Tramadol Itching    Skin burning and itching  . Adhesive [Tape] Itching  . Clarithromycin Itching  . Darvon Itching  . Epinephrine Itching  . Morphine And Related Itching  . Oxycodone Itching  . Sulfa Antibiotics Itching  . Vicodin [Hydrocodone-Acetaminophen] Itching    Can take with Benadryl    Patient Measurements: Height: 5\' 4"  (162.6 cm) Weight: 137 lb 5.6 oz (62.3 kg) IBW/kg (Calculated) : 54.7  Vital Signs: Temp: 97.9 F (36.6 C) (06/04 3500) Temp src: Oral (06/04 0614) BP: 141/72 mmHg (06/04 0614) Pulse Rate: 94 (06/04 0614) Intake/Output from previous day: 06/03 0701 - 06/04 0700 In: 1020 [P.O.:720; IV Piggyback:300] Out: 450 [Urine:450] Intake/Output from this shift: Total I/O In: 50 [IV Piggyback:50] Out: -   Labs:  Recent Labs  05/06/14 2345 05/07/14 0510 05/08/14 0507 05/09/14 0503  WBC 12.9* 11.1*  --  8.8  HGB 10.2* 10.0*  --  9.4*  PLT 461* 464*  --  547*  CREATININE 0.73 0.82 0.82 0.87   Estimated Creatinine Clearance: 46.8 ml/min (by C-G formula based on Cr of 0.87). No results found for this basename: VANCOTROUGH, Corlis Leak, VANCORANDOM, GENTTROUGH, GENTPEAK, GENTRANDOM, [place], TOBRAPEAK, TOBRARND, AMIKACINPEAK, AMIKACINTROU, AMIKACIN,  in the last 72 hours   Microbiology: Recent Results (from the past 720 hour(s))  CULTURE, BLOOD (ROUTINE X 2)     Status: None   Collection Time    04/25/14  2:07 PM      Result Value Ref Range Status   Specimen Description BLOOD LEFT ARM   Final   Special Requests BOTTLES DRAWN AEROBIC AND ANAEROBIC 5ML   Final    Culture  Setup Time     Final   Value: 04/25/2014 21:51     Performed at [facility]   Culture     Final   Value: NO GROWTH 5 DAYS     Performed at [facility]   Report Status 05/01/2014 FINAL   Final  CULTURE, BLOOD (ROUTINE X 2)     Status: None   Collection Time    04/25/14  2:12 PM      Result Value Ref Range Status   Specimen Description BLOOD RIGHT ARM   Final   Special Requests BOTTLES DRAWN AEROBIC AND ANAEROBIC 5ML   Final   Culture  Setup Time     Final   Value: 04/25/2014 21:52     Performed at [facility]   Culture     Final   Value: DIPHTHEROIDS(CORYNEBACTERIUM SPECIES)     Note: Standardized susceptibility testing for this organism is not available.     Note: Gram Stain Report Called to,Read Back By and Verified With: Dawayne Patricia 05/01/14 AT 0240 [place]     Performed at [facility]   Report Status 05/02/2014 FINAL   Final  URINE CULTURE     Status: None   Collection Time    04/25/14  4:16 PM      Result Value Ref Range Status   Specimen Description URINE, CLEAN CATCH   Final   Special Requests NONE  Final   Culture  Setup Time     Final   Value: 04/26/2014 00:42     Performed at [place] Count     Final   Value: NO GROWTH     Performed at [facility]   Culture     Final   Value: NO GROWTH     Performed at [facility]   Report Status 04/27/2014 FINAL   Final  CULTURE, EXPECTORATED SPUTUM-ASSESSMENT     Status: None   Collection Time    04/26/14  7:32 AM      Result Value Ref Range Status   Specimen Description SPUTUM   Final   Special Requests Immunocompromised   Final   Sputum evaluation     Final   Value: THIS SPECIMEN IS ACCEPTABLE. RESPIRATORY CULTURE REPORT TO FOLLOW.   Report Status 04/26/2014 FINAL   Final  CULTURE, RESPIRATORY (NON-EXPECTORATED)     Status: None   Collection Time    04/26/14  7:32 AM      Result Value Ref Range Status   Specimen Description SPUTUM    Final   Special Requests NONE   Final   Gram Stain     Final   Value: ABUNDANT WBC PRESENT,BOTH PMN AND MONONUCLEAR     NO SQUAMOUS EPITHELIAL CELLS SEEN     MODERATE GRAM POSITIVE COCCI IN PAIRS     Performed at [facility]   Culture     Final   Value: FEW CANDIDA ALBICANS     Performed at [facility]   Report Status 04/28/2014 FINAL   Final  CLOSTRIDIUM DIFFICILE BY PCR     Status: None   Collection Time    04/29/14  9:37 PM      Result Value Ref Range Status   C difficile by pcr NEGATIVE  NEGATIVE Final   Comment: Performed at [place], BLOOD (ROUTINE X 2)     Status: None   Collection Time    05/07/14 12:55 AM      Result Value Ref Range Status   Specimen Description BLOOD LEFT HAND   Final   Special Requests BOTTLES DRAWN AEROBIC AND ANAEROBIC 5CC   Final   Culture  Setup Time     Final   Value: 05/07/2014 04:13     Performed at [facility]   Culture     Final   Value:        BLOOD CULTURE RECEIVED NO GROWTH TO DATE CULTURE WILL BE HELD FOR 5 DAYS BEFORE ISSUING A FINAL NEGATIVE REPORT     Performed at [facility]   Report Status PENDING   Incomplete  CULTURE, BLOOD (ROUTINE X 2)     Status: None   Collection Time    05/07/14  1:00 AM      Result Value Ref Range Status   Specimen Description BLOOD RIGHT ANTECUBITAL   Final   Special Requests BOTTLES DRAWN AEROBIC AND ANAEROBIC 5CC   Final   Culture  Setup Time     Final   Value: 05/07/2014 04:13     Performed at [facility]   Culture     Final   Value:        BLOOD CULTURE RECEIVED NO GROWTH TO DATE CULTURE WILL BE HELD FOR 5 DAYS BEFORE ISSUING A FINAL NEGATIVE REPORT     Performed at [facility]   Report Status  PENDING   Incomplete  MRSA PCR SCREENING     Status: None   Collection Time    05/07/14  3:14 AM      Result Value Ref Range Status   MRSA by PCR NEGATIVE  NEGATIVE Final   Comment:            The GeneXpert MRSA Assay (FDA      approved for NASAL specimens     only), is one component of a     comprehensive MRSA colonization     surveillance program. It is not     intended to diagnose MRSA     infection nor to guide or     monitor treatment for     MRSA infections.  BODY FLUID CULTURE     Status: None   Collection Time    05/07/14  4:06 PM      Result Value Ref Range Status   Specimen Description PLEURAL   Final   Special Requests NONE   Final   Gram Stain     Final   Value: RARE WBC PRESENT, PREDOMINANTLY PMN     NO ORGANISMS SEEN     Performed at [facility]   Culture     Final   Value: NO GROWTH     Performed at [facility]   Report Status PENDING   Incomplete    Medical History: Past Medical History  Diagnosis Date  . Pernicious anemia   . Osteoarthritis   . Fibromyalgia   . Skin lesion     chronic calcific cutaneous lesions  . HX of multiple bleeding ulcers 09/01/2011  . Hypertension     takes Atenolol daily  . Emphysema   . Bronchitis     hx  . Adenocarcinoma of gastroesophageal junction   . Dizziness     d/t crystals in ears that "roll around"and can't get them out  . Back pain   . Osteoporosis   . Scoliosis   . Psoriasis   . H/O hiatal hernia   . GERD (gastroesophageal reflux disease)     takes Aciphex daily  . Gastric ulcer   . Constipation     since Chemo and Radiation;finished up in Nov 2012  . Diverticulosis   . History of colonic polyps   . Shingles     broke out 2wks ago;has been on meds and to finish up tomorrow  . Kidney stone 25+yrs ago  . Pernicious anemia   . Vitamin B deficiency   . Complication of anesthesia     shortness of breath and pain with gas   . COPD (chronic obstructive pulmonary disease)   . Shortness of breath     with exertion   . Blood transfusion     Medications:  Anti-infectives   Start     Dose/Rate Route Frequency Ordered Stop   05/07/14 1800  vancomycin (VANCOCIN) 500 mg in sodium chloride 0.9 % 100 mL IVPB     500  mg 100 mL/hr over 60 Minutes Intravenous Every 12 hours 05/07/14 0333     05/07/14 0600  piperacillin-tazobactam (ZOSYN) IVPB 3.375 g     3.375 g 12.5 mL/hr over 240 Minutes Intravenous 3 times per day 05/07/14 0333     05/07/14 0100  piperacillin-tazobactam (ZOSYN) IVPB 3.375 g     3.375 g 100 mL/hr over 30 Minutes Intravenous  Once 05/07/14 0045 05/07/14 0143   05/07/14 0045  vancomycin (VANCOCIN) IVPB 1000 mg/200 mL premix  1,000 mg 200 mL/hr over 60 Minutes Intravenous  Once 05/07/14 0043 05/07/14 0245   05/07/14 0045  piperacillin-tazobactam (ZOSYN) IVPB 3.375 g  Status:  Discontinued     3.375 g 12.5 mL/hr over 240 Minutes Intravenous  Once 05/07/14 0043 05/07/14 0730     Assessment: 80 yo former smoker(quit 20 years ago)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) who presents with 2 weeks of cough, malaise, 2 days of fever and radiographic evidence of pneumonia presumed to be aspiration PNA started on vancomycin and Zosyn on 6/2  6/2 >> vancomycin >> 6/2 >> zosyn >>   Scr stable, estimated CrCl 47 ml/min  WBC improved, now WNL  afebrile  Goal of Therapy:  Vancomycin trough level 15-20 mcg/ml Zosyn based on renal function   Plan:  1) Continue vancomycin 500mg  IV q12 for now 2) Continue Zosyn 3.375g IV q8 (extended interval infusion) 3) Will check a vancomycin trough prior to next dose, which will be the 6th dose   Adrian Saran, PharmD, BCPS Pager [phone] 05/09/2014 10:49 AM

## 2014-05-09 NOTE — Progress Notes (Signed)
PHARMACY BRIEF NOTE - Drug Level Result  Consult for:  Vancomycin Indication:  Pneumonia  The current Vancomycin dose is 500 mg IV every 12 hours.  The trough level drawn prior to the 6 pm dose today is reported as 15.6 mcg/ml.  This level is in the low end of the therapeutic range (15-20 mcg/ml), and is an acceptable level.  Plan: Continue the current Vancomycin dose.  [place] HeightsPh. 05/09/2014 8:17 PM

## 2014-05-09 NOTE — Progress Notes (Signed)
Agree with the previous RN's assessment. Will continue to monitor patient. [name]  

## 2014-05-09 NOTE — Progress Notes (Signed)
Patient ID: [name], female   DOB: 03-May-1934, 80 y.o.   MRN: [id number]   [facility] PROGRESS NOTE  [name] XNA:[id number] DOB: [birth year] DOA: 05/06/2014 PCP: [name], MD  Brief narrative: 80 yo former smoker (quit 20 years ago) hx of esophogeal adenocarcinoma (post resection 3 years ago per Dr. Arlyce Dice) who presents with 2 weeks of cough, malaise, 2 days of fever and radiographic evidence of LLL PNA, small to mod pleural effusion, associated with hypoxia and requiring supplemental oxygen. Due to the fact she has declined, despite treatment, she has been admitted and PCCM asked to consult.   Active Problems:   Acute hypoxic respiratory failure in the setting of COPD  - PNA (presumed aspiration due to gastric pull-up), HCAP, L effusion  - continuing chronic steroids, oxygen as needed, BD's  - continue broad spectrum ABX Vancomycin and Zosyn day 3. - had thoracentesis 6/2 with removal of 600 cc. -incentive spirometry. -Repeat CT chest shows improved PNA. -Consider switching IV abx to PO soon (would include anaerobe coverage for potential aspiration given h/o gastric pull up). -Will get PT/OT evals for dispo planning.    Leukocytosis  - secondary to HCAP, ABX as noted above and repeat CBC in AM -Improving.    Adenocarcinoma of gastroesophageal junction - follows with Dr. [name], will send him a note to alert him pt has been hospitalized     Hypertension -Hypotensive 6/3 with BP of 70/40. -BP improved and is becoming hypertensive today. -Will restart norvasc and atenolol today.    COPD (chronic obstructive pulmonary disease)  - oxygen as needed, BD's, chronic steroids  -Will try to wean oxygen as tolerated.    Hypokalemia - supplement and repeat BMP in AM -Continue to replete.    Anemia of chronic disease - Hg and Hct stable and at baseline - no signs of active bleeding - Lovenox for DVT prophylaxis     Severe malnutrition - in the setting  of acute illness - encourage PO intake   Consultants:  PCCM  Procedures/Studies: Dg Chest 2 View   05/07/2014   Worsening retrocardiac consolidation of small to moderate left pleural effusion. Recommend followup chest radiograph after treatment to verify improvement.  Suspected mild cardiomegaly with central pulmonary vasculature congestion.   Antibiotics:  Vancomycin 6/2 -->  Zosyn 6/2 -->  Code Status: Full Family Communication: Pt at bedside Disposition Plan: To be determined. PT/OT evals requested.  HPI/Subjective: No events overnight.   Objective: Filed Vitals:   05/09/14 0210 05/09/14 0219 05/09/14 0614 05/09/14 0810  BP:   141/72   Pulse:   94   Temp:   97.9 F (36.6 C)   TempSrc:   Oral   Resp:   18   Height:      Weight:   62.3 kg (137 lb 5.6 oz)   SpO2: 90% 98% 97% 96%    Intake/Output Summary (Last 24 hours) at 05/09/14 1301 Last data filed at 05/09/14 0703  Gross per 24 hour  Intake    830 ml  Output    450 ml  Net    380 ml    Exam:   General:  Pt is alert, follows commands appropriately, frail but NAD  Cardiovascular: Regular rate and rhythm, no rubs, no gallops  Respiratory: Poor inspiratory effort, diminished air movement bilaterally with no wheezing   Abdomen: Soft, non tender, non distended, bowel sounds present, no guarding  Extremities: No edema, pulses DP and PT palpable bilaterally  Data Reviewed: Basic Metabolic Panel:  Recent Labs Lab 05/06/14 2345 05/07/14 0510 05/08/14 0507 05/09/14 0503  NA 135* 138 137 141  K 3.5* 3.3* 3.4* 3.5*  CL 102 101 102 106  CO2 15* 21 23 23   GLUCOSE 97 98 104* 98  BUN 8 7 8 6   CREATININE 0.73 0.82 0.82 0.87  CALCIUM 9.3 9.6 9.3 9.3   Liver Function Tests:  Recent Labs Lab 05/06/14 2345 05/07/14 0510  AST 21 18  ALT 6 <5  ALKPHOS 59 57  BILITOT 0.2* 0.2*  PROT 6.8 6.6  ALBUMIN 2.3* 2.4*   CBC:  Recent Labs Lab 05/06/14 2345 05/07/14 0510 05/09/14 0503  WBC 12.9* 11.1* 8.8   NEUTROABS 9.8* 8.3*  --   HGB 10.2* 10.0* 9.4*  HCT 32.2* 31.8* 30.8*  MCV 90.4 89.8 91.1  PLT 461* 464* 547*    Recent Results (from the past 240 hour(s))  CLOSTRIDIUM DIFFICILE BY PCR     Status: None   Collection Time    04/29/14  9:37 PM      Result Value Ref Range Status   C difficile by pcr NEGATIVE  NEGATIVE Final   Comment: Performed at [place], BLOOD (ROUTINE X 2)     Status: None   Collection Time    05/07/14 12:55 AM      Result Value Ref Range Status   Specimen Description BLOOD LEFT HAND   Final   Special Requests BOTTLES DRAWN AEROBIC AND ANAEROBIC 5CC   Final   Culture  Setup Time     Final   Value: 05/07/2014 04:13     Performed at [facility]   Culture     Final   Value:        BLOOD CULTURE RECEIVED NO GROWTH TO DATE CULTURE WILL BE HELD FOR 5 DAYS BEFORE ISSUING A FINAL NEGATIVE REPORT     Performed at [facility]   Report Status PENDING   Incomplete  CULTURE, BLOOD (ROUTINE X 2)     Status: None   Collection Time    05/07/14  1:00 AM      Result Value Ref Range Status   Specimen Description BLOOD RIGHT ANTECUBITAL   Final   Special Requests BOTTLES DRAWN AEROBIC AND ANAEROBIC 5CC   Final   Culture  Setup Time     Final   Value: 05/07/2014 04:13     Performed at [facility]   Culture     Final   Value:        BLOOD CULTURE RECEIVED NO GROWTH TO DATE CULTURE WILL BE HELD FOR 5 DAYS BEFORE ISSUING A FINAL NEGATIVE REPORT     Performed at [facility]   Report Status PENDING   Incomplete  MRSA PCR SCREENING     Status: None   Collection Time    05/07/14  3:14 AM      Result Value Ref Range Status   MRSA by PCR NEGATIVE  NEGATIVE Final   Comment:            The GeneXpert MRSA Assay (FDA     approved for NASAL specimens     only), is one component of a     comprehensive MRSA colonization     surveillance program. It is not     intended to diagnose MRSA     infection nor to guide or     monitor  treatment for  MRSA infections.  BODY FLUID CULTURE     Status: None   Collection Time    05/07/14  4:06 PM      Result Value Ref Range Status   Specimen Description PLEURAL   Final   Special Requests NONE   Final   Gram Stain     Final   Value: RARE WBC PRESENT, PREDOMINANTLY PMN     NO ORGANISMS SEEN     Performed at [facility]   Culture     Final   Value: NO GROWTH 1 DAY     Performed at [facility]   Report Status PENDING   Incomplete     Scheduled Meds: . amLODipine  2.5 mg Oral Daily  . aspirin EC  81 mg Oral Daily  . atenolol  50 mg Oral BID  . budesonide-formoterol  2 puff Inhalation BID  . enoxaparin injection  40 mg Subcutaneous Q24H  . lisinopril  40 mg Oral q morning - 10a  . loratadine  10 mg Oral Daily  . pantoprazole  40 mg Oral Daily  . ZOSYN  IV  3.375 g Intravenous 3 times per day  . potassium chloride  10 mEq Intravenous Q1 Hr x 2  . predniSONE  2 mg Oral Q breakfast  . vancomycin  500 mg Intravenous Q12H   Continuous Infusions:  Erline Hau, MD  [facility] Pager [phone]  If 7PM-7AM, please contact night-coverage [email or web address] Password [email or web address] 05/09/2014, 1:01 PM   LOS: 3 days

## 2014-05-10 LAB — BASIC METABOLIC PANEL
BUN: 5 mg/dL — ABNORMAL LOW (ref 6–23)
CALCIUM: 9.5 mg/dL (ref 8.4–10.5)
CO2: 24 mEq/L (ref 19–32)
CREATININE: 0.89 mg/dL (ref 0.50–1.10)
Chloride: 108 mEq/L (ref 96–112)
GFR calc Af Amer: 71 mL/min — ABNORMAL LOW (ref >90)
GFR calc non Af Amer: 61 mL/min — ABNORMAL LOW (ref >90)
Glucose, Bld: 97 mg/dL (ref 70–99)
Potassium: 4 mEq/L (ref 3.7–5.3)
Sodium: 142 mEq/L (ref 137–147)

## 2014-05-10 MED ORDER — AMOXICILLIN-POT CLAVULANATE 875-125 MG PO TABS
1.0000 | ORAL_TABLET | Freq: Two times a day (BID) | ORAL | Status: DC
  Administered 2014-05-10 – 2014-05-13 (×7): 1 via ORAL
  Filled 2014-05-10 (×8): qty 1

## 2014-05-10 NOTE — Evaluation (Signed)
Occupational Therapy Evaluation Patient Details Name: [name] MRN: [id number] DOB: Apr 18, 1934 Today's Date: 05/10/2014    History of Present Illness 80 year old female admitted for pna.  She has a h/o adenocarcinoma of the GE junction   Clinical Impression   Pt was admitted for pna.  She reports that LUE became stiff and swollen from IV, and she is not able to use LUE to assist with adls.  She will benefit from skilled OT to increase safety and independence with adls:  Goals in acute are for set up to min A levels; she is overall min to mod A at this time.      Follow Up Recommendations  SNF (Pt would benefit, but she may refuse).  Pt states that her son has pna at this time also.  If home, she will need 24/7 and assistance with adls.  Would recommend HHOT if she returns home.   Equipment Recommendations  None recommended by OT    Recommendations for Other Services       Precautions / Restrictions Precautions Precautions: Fall Restrictions Weight Bearing Restrictions: No      Mobility Bed Mobility Overal bed mobility: Modified Independent       Sit to supine: Modified independent (Device/Increase time)   General bed mobility comments: with rail  Transfers Overall transfer level: Needs assistance Equipment used: None Transfers: Stand Pivot Transfers;Sit to/from Stand Sit to Stand: Min guard Stand pivot transfers: Min assist       General transfer comment: cues for hand placement.  steadying during transfer    Balance Overall balance assessment: Needs assistance         Standing balance support: No upper extremity supported   Standing balance comment: min guard for safety                            ADL Overall ADL's : Needs assistance/impaired Eating/Feeding: Set up   Grooming: Minimal assistance;Sitting;Oral care   Upper Body Bathing: Minimal assitance;Sitting   Lower Body Bathing: Minimal assistance;Sit to/from stand   Upper  Body Dressing : Moderate assistance;Sitting   Lower Body Dressing: Moderate assistance;Sit to/from stand   Toilet Transfer: Minimal assistance;Stand-pivot   Toileting- Clothing Manipulation and Hygiene: Minimal assistance;Sit to/from stand         General ADL Comments: Pt moves very quickly and is unsteady.  Performed grooming, donning shirt and new gown and transferred back to bed.  Educated on edema management--positioning and moving fingers.  Pt is tolerating cool cloth over dorsum of hand.     Vision                     Perception     Praxis      Pertinent Vitals/Pain LUE painful--a lot of pain per patient. She was premedicated, and repositioned into elevation for edema management     Hand Dominance     Extremity/Trunk Assessment Upper Extremity Assessment Upper Extremity Assessment: LUE deficits/detail LUE Deficits / Details: Lhand wrist/forearm swollen and sore.  Pt guarding arm.  Willing to move fingers a little for edema management.  Arthritic changes present bilaterally   Lower Extremity Assessment Lower Extremity Assessment: Overall WFL for tasks assessed   Cervical / Trunk Assessment Cervical / Trunk Assessment: Normal   Communication Communication Communication: No difficulties   Cognition Arousal/Alertness: Awake/alert Behavior During Therapy: WFL for tasks assessed/performed Overall Cognitive Status: Within Functional Limits for tasks assessed  General Comments       Exercises       Shoulder Instructions      Home Living Family/patient expects to be discharged to:: Private residence Living Arrangements: Children Available Help at Discharge: Available 24 hours/day Type of Home: House Home Access: Level entry     Home Layout: Two level;Able to live on main level with bedroom/bathroom     Bathroom Shower/Tub: Occupational psychologist: Standard     Home Equipment: Environmental consultant - 2 wheels;Cane - single  point;Crutches;Shower seat;Bedside commode          Prior Functioning/Environment Level of Independence: Independent        Comments: walks without assistive device    OT Diagnosis: Generalized weakness   OT Problem List: Decreased strength;Decreased activity tolerance;Impaired balance (sitting and/or standing);Increased edema;Pain;Impaired UE functional use   OT Treatment/Interventions: Self-care/ADL training;DME and/or AE instruction;Patient/family education;Balance training;Therapeutic activities    OT Goals(Current goals can be found in the care plan section) Acute Rehab OT Goals Patient Stated Goal: get someone to help me at home:  cooking a little cleaning, etc. OT Goal Formulation: With patient Time For Goal Achievement: 05/24/14 Potential to Achieve Goals: Good ADL Goals Pt Will Perform Grooming: with set-up;standing;with supervision Pt Will Perform Upper Body Bathing: with set-up Pt Will Perform Lower Body Bathing: with min guard assist Pt Will Perform Upper Body Dressing: with set-up;sitting Pt Will Perform Lower Body Dressing: with min assist;sit to/from stand Pt Will Transfer to Toilet: with min guard assist;ambulating;bedside commode Pt Will Perform Toileting - Clothing Manipulation and hygiene: with min guard assist;sit to/from stand  OT Frequency: Min 2X/week   Barriers to D/C:            Co-evaluation              End of Session    Activity Tolerance: Patient limited by fatigue Patient left: in bed;with call bell/phone within reach;with bed alarm set   Time: 1534-1601 OT Time Calculation (min): 27 min Charges:  OT General Charges $OT Visit: 1 Procedure OT Evaluation $Initial OT Evaluation Tier I: 1 Procedure OT Treatments $Self Care/Home Management : 8-22 mins $Therapeutic Activity: 8-22 mins G-Codes:    Lesle Chris 05/13/2014, 4:18 PM Lesle Chris, OTR/L [phone] May 13, 2014

## 2014-05-10 NOTE — Progress Notes (Signed)
PT Cancellation Note  Patient Details Name: RIYANA BIEL MRN: [id number] DOB: October 28, 1934   Cancelled Treatment:    Reason Eval/Treat Not Completed: Pain limiting ability to participate;Other (comment) (L hand painful and swollen from IV stick in ambulance, pain meds requested. ) Will follow.    Lucile Crater 05/10/2014, 1:35 PM [phone]

## 2014-05-10 NOTE — Progress Notes (Signed)
PULMONARY / CRITICAL CARE MEDICINE   Name: [name] MRN: [id number] DOB: 1934-05-05    ADMISSION DATE:  05/06/2014 CONSULTATION DATE: 6/2  REFERRING MD :  [facility] PRIMARY SERVICE: [facility]  CHIEF COMPLAINT:  dyspnea  BRIEF PATIENT DESCRIPTION:  80 yo former smoker (quit 20 years ago) hx of esophogeal adenocarcinoma (post resection 3 years ago per Dr. Arlyce Dice) who presents with 2 weeks of cough, malaise, 2 days of fever and radiographic evidence of LLL PNA, small to mod pleural effusion. She is hypoxic and is requiring supplemental oxygen. Due to the fact she has decline, despite treatment, she has been admitted and PCCM asked to consult.   SIGNIFICANT EVENTS / STUDIES:  6/2 left  thora per IR 600 cc obtained 6/3 ct chest: 1. Moderate layering pleural effusions greater on the left. Airspace  opacification in the left lower lobe has decreased since the  04/25/2014 CTA, but lower lobe consolidation remains. On the right  there is mainly compressive atelectasis.  2. Otherwise stable chest status post gastric pull-through.  6/3 2 d Left ventricle: The cavity size was normal. Wall thickness was increased in a pattern of mild LVH. Systolic function was normal. The estimated ejection fraction was in the range of 50% to 55%. Doppler parameters are consistent with abnormal left ventricular relaxation (grade 1 diastolic dysfunction).   LINES / TUBES:  CULTURES: 6/2 bc x 2>> 6/2 uc>>not done 6/3 pleural fluid>>  ANTIBIOTICS: 6/2 vanc>>6/5 6/2 pip-tazo>>6/5 6/5 Augmentin>>  HISTORY OF PRESENT ILLNESS:   80 yo former smoker(quit 20 years ago)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) who presents with 2 weeks of cough, malaise, 2 days of fever and radiographic evidence of pneumonia. She is hypoxic and is requiring supplemental oxygen. Due to the fact she has decline, despite treatment, she has been admitted and PCCM asked to consult.   SUBJECTIVE:  "  I'm 30% of my baseline".  VITAL SIGNS: Temp:  [97.6 F (36.4 C)-98.5 F (36.9 C)] 97.6 F (36.4 C) (06/05 1021) Pulse Rate:  [74-110] 110 (06/05 1021) Resp:  [18-24] 24 (06/05 1021) BP: (122-149)/(60-76) 122/76 mmHg (06/05 1021) SpO2:  [94 %-97 %] 96 % (06/05 1021) Weight:  [62 kg (136 lb 11 oz)] 62 kg (136 lb 11 oz) (06/05 0502) HEMODYNAMICS:   VENTILATOR SETTINGS:   INTAKE / OUTPUT: Intake/Output     06/04 0701 - 06/05 0700 06/05 0701 - 06/06 0700   P.O. 300    IV Piggyback 100    Total Intake(mL/kg) 400 (6.5)    Urine (mL/kg/hr) 100 (0.1)    Total Output 100     Net +300          Urine Occurrence 1 x      PHYSICAL EXAMINATION: General:  Frail, wasted , arthritic, wf, nad at rest  Neuro:  Intact less lethargic HEENT:  No JVD/LAN Cardiovascular:  HSR RRR Lungs:  Diminished in bases, no overt wheeze, increased air left base. Has taken her O2 off Abdomen:  Mild tenderness, + bs Musculoskeletal:  Arthritic changes noted Skin:  Warm , dry, no edema  LABS:  CBC  Recent Labs Lab 05/06/14 2345 05/07/14 0510 05/09/14 0503  WBC 12.9* 11.1* 8.8  HGB 10.2* 10.0* 9.4*  HCT 32.2* 31.8* 30.8*  PLT 461* 464* 547*   Coag's No results found for this basename: APTT, INR,  in the last 168 hours BMET  Recent Labs Lab 05/08/14 0507 05/09/14 0503 05/10/14 0436  NA 137 141  142  K 3.4* 3.5* 4.0  CL 102 106 108  CO2 23 23 24   BUN 8 6 5*  CREATININE 0.82 0.87 0.89  GLUCOSE 104* 98 97   Electrolytes  Recent Labs Lab 05/08/14 0507 05/09/14 0503 05/10/14 0436  CALCIUM 9.3 9.3 9.5   Sepsis Markers No results found for this basename: LATICACIDVEN, PROCALCITON, O2SATVEN,  in the last 168 hours ABG  Recent Labs Lab 05/07/14 0240  PHART 7.424  PCO2ART 31.6*  PO2ART 71.8*   Liver Enzymes  Recent Labs Lab 05/06/14 2345 05/07/14 0510  AST 21 18  ALT 6 <5  ALKPHOS 59 57  BILITOT 0.2* 0.2*  ALBUMIN 2.3* 2.4*   Cardiac Enzymes  Recent Labs Lab  05/06/14 2345  PROBNP 5856.0*   Glucose No results found for this basename: GLUCAP,  in the last 168 hours  Imaging Ct Chest Wo Contrast  05/08/2014   CLINICAL DATA:  80 year old female with history of esophageal adenocarcinoma resected 3 years ago. Two weeks of cough, malacia is, fever, abnormal left lower lobe on chest radiograph. Initial encounter.  EXAM: CT CHEST WITHOUT CONTRAST  TECHNIQUE: Multidetector CT imaging of the chest was performed following the standard protocol without IV contrast.  COMPARISON:  Portable chest radiograph 05/07/2014. Chest CTA 04/25/2014.  FINDINGS: Sequelae of gastric pull-through. Negative visualized thoracic inlet. Stable small mediastinal lymph nodes. No lymphadenopathy is identified in the absence of IV contrast. Noncontrast visible upper abdominal viscera appear stable. Extensive aortic calcified atherosclerosis. Extensive coronary artery calcified atherosclerosis.  No pericardial effusion. Moderate layering bilateral pleural effusions, slightly greater on the left.  Major airways are patent. There is compressive atelectasis of the right lower lobe. There is greater opacification of the left lower lobe with some air bronchograms (series 5, image 32). Small hyperdense foci in might reflect calcifications in the collapsed or consolidated lower lobes, uncertain.  Centrilobular in occasional paraseptal emphysema re- identified in the upper lobes which are stable. Mild opacity in the lingula. The right middle lobe is clear.  Multilevel augmented thoracic compression fractures re- identified. Stable visualized osseous structures. No destructive or suspicious osseous lesion identified.  IMPRESSION: 1. Moderate layering pleural effusions greater on the left. Airspace opacification in the left lower lobe has decreased since the 04/25/2014 CTA, but lower lobe consolidation remains. On the right there is mainly compressive atelectasis. 2. Otherwise stable chest status post gastric  pull-through.   Electronically Signed   By: Lars Pinks M.D.   On: 05/08/2014 14:59       ASSESSMENT / PLAN:  PULMONARY A: Hypoxic resp failure in setting of COPD.  PNA (presumed aspiration due to gastric pull-up), HCAP > infiltrate improved on 6/3 CT, no evidence necrosis or abscess L > R effusion P:   O2 as needed Agree with continuing her chronic steroid dose in absence bronchospasm BD Abx Swallowing and aspiration precautions  CARDIOVASCULAR A: HTN P:  Antihypertensives Consider recheck 2 d echo [place] in numbers  RENAL A:  No acute issue P:     GASTROINTESTINAL A:  Solid dysphagia due to surgery P:   Thin diet  HEMATOLOGIC A:  Hx of adenoCA of ge junction. Followed by Dr. [name]. P:  Monitor  INFECTIOUS A:  Presumed aspiration pna / HCAP L parapneumonic effusion vs empyema > cx negative to date At risk lung abscess > none seen on CT 6/3 P:   See flows Follow cx data  ENDOCRINE A:  No acute issue  P:     NEUROLOGIC  A:  No acute issue P:   RASS goal:0   TODAY'S SUMMARY: Little for PCCM to offer at this point. We will be available PRN.  Richardson Landry Minor ACNP Maryanna Shape PCCM Pager [phone] till 3 pm If no answer page [phone] 05/10/2014, 12:35 PM   Baltazar Apo, MD, PhD 05/10/2014, 12:35 PM [facility] [phone] or if no answer [phone]

## 2014-05-10 NOTE — Progress Notes (Signed)
Patient ID: [name], female   DOB: 1934/11/13, 80 y.o.   MRN: [id number]   [facility] PROGRESS NOTE  [name]:[id number] DOB: 08/24/1934 DOA: 05/06/2014 PCP: [name], MD  Brief narrative: 80 yo former smoker (quit 20 years ago) hx of esophogeal adenocarcinoma (post resection 3 years ago per Dr. Arlyce Dice) who presents with 2 weeks of cough, malaise, 2 days of fever and radiographic evidence of LLL PNA, small to mod pleural effusion, associated with hypoxia and requiring supplemental oxygen. Due to the fact she has declined, despite treatment, she has been admitted and PCCM asked to consult.   Active Problems:   Acute hypoxic respiratory failure in the setting of COPD  - PNA (presumed aspiration due to gastric pull-up), HCAP, L effusion  - continuing chronic steroids, oxygen as needed, BD's  - continue broad spectrum ABX Vancomycin and Zosyn day 3. - had thoracentesis 6/2 with removal of 600 cc. -incentive spirometry. -Repeat CT chest shows improved PNA. -Will switch to PO abx today-->Augmentin (would include anaerobe coverage for potential aspiration given h/o gastric pull up). -Will get PT/OT evals for dispo planning. Suspect will need SNF.    Leukocytosis  - secondary to HCAP, ABX as noted above and repeat CBC in AM -Improving.    Adenocarcinoma of gastroesophageal junction - follows with Dr. [name], will send him a note to alert him pt has been hospitalized     Hypertension -Hypotensive 6/3 with BP of 70/40. -BP improved and is becoming hypertensive today. -Will restart norvasc and atenolol 6/4. -Is again normotensive on BP regimen.    COPD (chronic obstructive pulmonary disease)  - oxygen as needed, BD's, chronic steroids  -Will try to wean oxygen as tolerated.    Hypokalemia - Repleted.    Anemia of chronic disease - Hg and Hct stable and at baseline - no signs of active bleeding - Lovenox for DVT prophylaxis     Severe  malnutrition - in the setting of acute illness - encourage PO intake   Consultants:  PCCM  Procedures/Studies: Dg Chest 2 View   05/07/2014   Worsening retrocardiac consolidation of small to moderate left pleural effusion. Recommend followup chest radiograph after treatment to verify improvement.  Suspected mild cardiomegaly with central pulmonary vasculature congestion.   Antibiotics:  Vancomycin 6/2 --> 6/5  Zosyn 6/2 --> 6/5  Augmentin 6/5-->  Code Status: Full Family Communication: Pt at bedside Disposition Plan: To be determined. PT/OT evals requested. Suspect will need SNF  HPI/Subjective: No events overnight.   Objective: Filed Vitals:   05/09/14 2103 05/10/14 0502 05/10/14 0921 05/10/14 1021  BP: 135/68 149/60  122/76  Pulse: 80 74  110  Temp: 97.8 F (36.6 C) 98.5 F (36.9 C)  97.6 F (36.4 C)  TempSrc: Oral Oral  Oral  Resp: 18 20  24   Height:      Weight:  62 kg (136 lb 11 oz)    SpO2: 95% 97% 96% 96%    Intake/Output Summary (Last 24 hours) at 05/10/14 1249 Last data filed at 05/09/14 2140  Gross per 24 hour  Intake    110 ml  Output    100 ml  Net     10 ml    Exam:   General:  Pt is alert, follows commands appropriately, frail but NAD  Cardiovascular: Regular rate and rhythm, no rubs, no gallops  Respiratory: Poor inspiratory effort, diminished air movement bilaterally with no wheezing   Abdomen: Soft, non tender, non  distended, bowel sounds present, no guarding  Extremities: No edema, pulses DP and PT palpable bilaterally  Data Reviewed: Basic Metabolic Panel:  Recent Labs Lab 05/06/14 2345 05/07/14 0510 05/08/14 0507 05/09/14 0503 05/10/14 0436  NA 135* 138 137 141 142  K 3.5* 3.3* 3.4* 3.5* 4.0  CL 102 101 102 106 108  CO2 15* 21 23 23 24   GLUCOSE 97 98 104* 98 97  BUN 8 7 8 6  5*  CREATININE 0.73 0.82 0.82 0.87 0.89  CALCIUM 9.3 9.6 9.3 9.3 9.5   Liver Function Tests:  Recent Labs Lab 05/06/14 2345 05/07/14 0510   AST 21 18  ALT 6 <5  ALKPHOS 59 57  BILITOT 0.2* 0.2*  PROT 6.8 6.6  ALBUMIN 2.3* 2.4*   CBC:  Recent Labs Lab 05/06/14 2345 05/07/14 0510 05/09/14 0503  WBC 12.9* 11.1* 8.8  NEUTROABS 9.8* 8.3*  --   HGB 10.2* 10.0* 9.4*  HCT 32.2* 31.8* 30.8*  MCV 90.4 89.8 91.1  PLT 461* 464* 547*    Recent Results (from the past 240 hour(s))  CULTURE, BLOOD (ROUTINE X 2)     Status: None   Collection Time    05/07/14 12:55 AM      Result Value Ref Range Status   Specimen Description BLOOD LEFT HAND   Final   Special Requests BOTTLES DRAWN AEROBIC AND ANAEROBIC 5CC   Final   Culture  Setup Time     Final   Value: 05/07/2014 04:13     Performed at [facility]   Culture     Final   Value:        BLOOD CULTURE RECEIVED NO GROWTH TO DATE CULTURE WILL BE HELD FOR 5 DAYS BEFORE ISSUING A FINAL NEGATIVE REPORT     Performed at [facility]   Report Status PENDING   Incomplete  CULTURE, BLOOD (ROUTINE X 2)     Status: None   Collection Time    05/07/14  1:00 AM      Result Value Ref Range Status   Specimen Description BLOOD RIGHT ANTECUBITAL   Final   Special Requests BOTTLES DRAWN AEROBIC AND ANAEROBIC 5CC   Final   Culture  Setup Time     Final   Value: 05/07/2014 04:13     Performed at [facility]   Culture     Final   Value:        BLOOD CULTURE RECEIVED NO GROWTH TO DATE CULTURE WILL BE HELD FOR 5 DAYS BEFORE ISSUING A FINAL NEGATIVE REPORT     Performed at [facility]   Report Status PENDING   Incomplete  MRSA PCR SCREENING     Status: None   Collection Time    05/07/14  3:14 AM      Result Value Ref Range Status   MRSA by PCR NEGATIVE  NEGATIVE Final   Comment:            The GeneXpert MRSA Assay (FDA     approved for NASAL specimens     only), is one component of a     comprehensive MRSA colonization     surveillance program. It is not     intended to diagnose MRSA     infection nor to guide or     monitor treatment for     MRSA  infections.  BODY FLUID CULTURE     Status: None   Collection Time    05/07/14  4:06  PM      Result Value Ref Range Status   Specimen Description PLEURAL   Final   Special Requests NONE   Final   Gram Stain     Final   Value: RARE WBC PRESENT, PREDOMINANTLY PMN     NO ORGANISMS SEEN     Performed at [facility]   Culture     Final   Value: NO GROWTH 2 DAYS     Performed at [facility]   Report Status PENDING   Incomplete     Scheduled Meds: . amLODipine  2.5 mg Oral Daily  . aspirin EC  81 mg Oral Daily  . atenolol  50 mg Oral BID  . budesonide-formoterol  2 puff Inhalation BID  . enoxaparin injection  40 mg Subcutaneous Q24H  . lisinopril  40 mg Oral q morning - 10a  . loratadine  10 mg Oral Daily  . pantoprazole  40 mg Oral Daily  . ZOSYN  IV  3.375 g Intravenous 3 times per day  . potassium chloride  10 mEq Intravenous Q1 Hr x 2  . predniSONE  2 mg Oral Q breakfast  . vancomycin  500 mg Intravenous Q12H   Continuous Infusions:  Erline Hau, MD  [facility] Pager [phone]  If 7PM-7AM, please contact night-coverage [email or web address] Password [email or web address] 05/10/2014, 12:49 PM   LOS: 4 days

## 2014-05-10 NOTE — Progress Notes (Signed)
OT Cancellation Note  Patient Details Name: KAILIE POLUS MRN: [id number] DOB: May 25, 1934   Cancelled Treatment:    Reason Eval/Treat Not Completed: Pain limiting ability to participate.  RN is waiting for pharmacy to send pain medication.  Will reattempt later today, if able.  Lesle Chris 05/10/2014, 2:00 PM Lesle Chris, OTR/L [phone] 05/10/2014

## 2014-05-10 NOTE — Care Management Note (Addendum)
    Page 1 of 1   05/13/2014     3:27:03 PM CARE MANAGEMENT NOTE 05/13/2014  Patient:  [name], [name]   Account Number:  [id number]  Date Initiated:  05/10/2014  Documentation initiated by:  Dessa Phi  Subjective/Objective Assessment:   80 Y/O F ADMITTED W/HCAP.ER:[id number] CA.READMIT-5/22-5/28.     Action/Plan:   FROM HOME.PATIENT DECLINED SNF LAST ADMISSION.ACTIVE [place] HHRN/PT/OT.   Anticipated DC Date:  05/13/2014   Anticipated DC Plan:  [place]  CM consult      Choice offered to / List presented to:             Status of service:  Completed, signed off Medicare Important Message given?  YES (If response is "NO", the following Medicare IM given date fields will be blank) Date Medicare IM given:  05/13/2014 Date Additional Medicare IM given:    Discharge Disposition:  [place]  Per UR Regulation:  Reviewed for med. necessity/level of care/duration of stay  If discussed at Long Length of Stay Meetings, dates discussed:    Comments:  05/13/14 Jasmine Maceachern RN,BSN NCM [phone] D/C SNF.  05/10/14 Tenita Cue RN,BSN NCM [phone] PT/OT ORDERED AWAIT RECOMMENDATIONS.[facility] FOLLOWING-REP KRISTEN AWARE.

## 2014-05-10 NOTE — Evaluation (Signed)
Physical Therapy Evaluation Patient Details Name: [name] MRN: [id number] DOB: [birth year] Today's Date: 05/10/2014   History of Present Illness  [name] is a 80 y.o. female with history of adenocarcinoma of the GE junction who was just admitted for pneumonia presents to the ER because of worsening shortness of breath with productive cough and weakness.  Dx of PNA.  Clinical Impression  *Pt admitted with PNA*. Pt currently with functional limitations due to the deficits listed below (see PT Problem List).  Pt will benefit from skilled PT to increase their independence and safety with mobility to allow discharge to the venue listed below.   **    Follow Up Recommendations Supervision/Assistance - 24 hour;Home health PT    Equipment Recommendations  None recommended by PT    Recommendations for Other Services OT consult     Precautions / Restrictions Precautions Precautions: Fall Restrictions Weight Bearing Restrictions: No      Mobility  Bed Mobility Overal bed mobility: Modified Independent Bed Mobility: Supine to Sit     Supine to sit: Modified independent (Device/Increase time)     General bed mobility comments: with rail, HOB up 30*  Transfers Overall transfer level: Needs assistance Equipment used: None Transfers: Stand Pivot Transfers;Sit to/from Stand Sit to Stand: Min guard Stand pivot transfers: Min guard       General transfer comment: cues for hand placement, min/guard for safety; pt stated she didn't feel well enough to walk  Ambulation/Gait             General Gait Details: unsteady. assist to stabilize throughout ambulation. dyspnea 2/4. 1 brief standing rest break  Financial trader [place] (Stroke Patients Only)       Balance                                             Pertinent Vitals/Pain *SaO2 91% on RA with activity 7/10 pain L hand, premedicated**     Home Living Family/patient expects to be discharged to:: Private residence Living Arrangements: Children Available Help at Discharge: Available 24 hours/day Type of Home: House Home Access: Level entry     Home Layout: Two level;Able to live on main level with bedroom/bathroom Home Equipment: Gilford Rile - 2 wheels;Cane - single point;Crutches;Shower seat;Bedside commode      Prior Function Level of Independence: Independent         Comments: walks without assistive device     Hand Dominance        Extremity/Trunk Assessment   Upper Extremity Assessment: Defer to OT evaluation           Lower Extremity Assessment: Overall WFL for tasks assessed      Cervical / Trunk Assessment: Normal  Communication   Communication: No difficulties  Cognition Arousal/Alertness: Awake/alert Behavior During Therapy: WFL for tasks assessed/performed Overall Cognitive Status: Within Functional Limits for tasks assessed                      General Comments      Exercises        Assessment/Plan    PT Assessment Patient needs continued PT services  PT Diagnosis Difficulty walking;Acute pain   PT Problem List Decreased mobility;Pain;Decreased activity tolerance  PT Treatment Interventions Gait training;Functional mobility  training;Therapeutic activities;Therapeutic exercise;Patient/family education   PT Goals (Current goals can be found in the Care Plan section) Acute Rehab PT Goals Patient Stated Goal: to get some rest PT Goal Formulation: With patient Time For Goal Achievement: 05/24/14 Potential to Achieve Goals: Good    Frequency Min 3X/week   Barriers to discharge        Co-evaluation               End of Session Equipment Utilized During Treatment: Gait belt Activity Tolerance: Patient limited by fatigue;Patient limited by pain Patient left: with call bell/phone within reach;in chair           Time: 1445-1458 PT Time Calculation (min):  13 min   Charges:   PT Evaluation $Initial PT Evaluation Tier I: 1 Procedure PT Treatments $Therapeutic Activity: 8-22 mins   PT G CodesLucile Crater 05/10/2014, 3:15 PM [phone]

## 2014-05-10 NOTE — Progress Notes (Signed)
Agree with the previous RN's assessment. Will continue to monitor patient.

## 2014-05-11 LAB — BODY FLUID CULTURE: CULTURE: NO GROWTH

## 2014-05-11 LAB — BASIC METABOLIC PANEL
BUN: 6 mg/dL (ref 6–23)
CALCIUM: 9.7 mg/dL (ref 8.4–10.5)
CO2: 23 mEq/L (ref 19–32)
Chloride: 105 mEq/L (ref 96–112)
Creatinine, Ser: 0.74 mg/dL (ref 0.50–1.10)
GFR, EST NON AFRICAN AMERICAN: 80 mL/min — AB (ref >90)
Glucose, Bld: 93 mg/dL (ref 70–99)
Potassium: 3.8 mEq/L (ref 3.7–5.3)
Sodium: 139 mEq/L (ref 137–147)

## 2014-05-11 NOTE — Plan of Care (Signed)
Problem: Discharge Progression Outcomes Goal: Tolerating diet Outcome: Completed/Met Date Met:  05/11/14 But po's poor due to decreased appetite

## 2014-05-11 NOTE — Progress Notes (Signed)
Patient ID: [name], female   DOB: 1934-11-14, 80 y.o.   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 Arlyce Dice) who presents with 2 weeks of cough, malaise, 2 days of fever and radiographic evidence of LLL PNA, small to mod pleural effusion, associated with hypoxia and requiring supplemental oxygen. Due to the fact she has declined, despite treatment, she has been admitted and PCCM asked to consult.   Active Problems:   Acute hypoxic respiratory failure in the setting of COPD  - PNA (presumed aspiration due to gastric pull-up), HCAP, L effusion  - continuing chronic steroids, oxygen as needed, BD's  - continue broad spectrum ABX Vancomycin and Zosyn day 3. - had thoracentesis 6/2 with removal of 600 cc. -incentive spirometry. -Repeat CT chest shows improved PNA. -Will switch to PO abx today-->Augmentin (would include anaerobe coverage for potential aspiration given h/o gastric pull up). -Will get PT/OT evals for dispo planning. Suspect will need SNF.    Leukocytosis  - secondary to HCAP, ABX as noted above and repeat CBC in AM -Improving.    Adenocarcinoma of gastroesophageal junction - follows with Dr. [name], will send him a note to alert him pt has been hospitalized     Hypertension -Hypotensive 6/3 with BP of 70/40. -BP improved and is becoming hypertensive today. -Will restart norvasc and atenolol 6/4. -Is again normotensive on BP regimen.    COPD (chronic obstructive pulmonary disease)  - oxygen as needed, BD's, chronic steroids  -Will try to wean oxygen as tolerated.    Hypokalemia - Repleted.    Anemia of chronic disease - Hg and Hct stable and at baseline - no signs of active bleeding - Lovenox for DVT prophylaxis     Severe  malnutrition - in the setting of acute illness - encourage PO intake   Consultants:  PCCM  Procedures/Studies: Dg Chest 2 View   05/07/2014   Worsening retrocardiac consolidation of small to moderate left pleural effusion. Recommend followup chest radiograph after treatment to verify improvement.  Suspected mild cardiomegaly with central pulmonary vasculature congestion.   Antibiotics:  Vancomycin 6/2 --> 6/5  Zosyn 6/2 --> 6/5  Augmentin 6/5-->  Code Status: Full Family Communication: Pt at bedside Disposition Plan: To be determined. PT/OT evals requested. Suspect will need SNF  HPI/Subjective: No events overnight.   Objective: Filed Vitals:   05/10/14 2047 05/10/14 2123 05/11/14 0522 05/11/14 0846  BP:   137/91   Pulse:   74   Temp:   97.8 F (36.6 C)   TempSrc:   Oral   Resp:   19   Height:      Weight:   62.7 kg (138 lb 3.7 oz)   SpO2: 96% 99% 96% 97%    Intake/Output Summary (Last 24 hours) at 05/11/14 1203 Last data filed at 05/10/14 1300  Gross per 24 hour  Intake    240 ml  Output      0 ml  Net    240 ml    Exam:   General:  Pt is alert, follows commands appropriately, frail but NAD  Cardiovascular: Regular rate and rhythm, no rubs, no gallops  Respiratory: Poor inspiratory effort, diminished air movement bilaterally with no wheezing   Abdomen: Soft, non tender, non distended, bowel sounds present, no  guarding  Extremities: No edema, pulses DP and PT palpable bilaterally  Data Reviewed: Basic Metabolic Panel:  Recent Labs Lab 05/07/14 0510 05/08/14 0507 05/09/14 0503 05/10/14 0436 05/11/14 0520  NA 138 137 141 142 139  K 3.3* 3.4* 3.5* 4.0 3.8  CL 101 102 106 108 105  CO2 21 23 23 24 23   GLUCOSE 98 104* 98 97 93  BUN 7 8 6  5* 6  CREATININE 0.82 0.82 0.87 0.89 0.74  CALCIUM 9.6 9.3 9.3 9.5 9.7   Liver Function Tests:  Recent Labs Lab 05/06/14 2345 05/07/14 0510  AST 21 18  ALT 6 <5  ALKPHOS 59 57  BILITOT 0.2* 0.2*  PROT  6.8 6.6  ALBUMIN 2.3* 2.4*   CBC:  Recent Labs Lab 05/06/14 2345 05/07/14 0510 05/09/14 0503  WBC 12.9* 11.1* 8.8  NEUTROABS 9.8* 8.3*  --   HGB 10.2* 10.0* 9.4*  HCT 32.2* 31.8* 30.8*  MCV 90.4 89.8 91.1  PLT 461* 464* 547*    Recent Results (from the past 240 hour(s))  CULTURE, BLOOD (ROUTINE X 2)     Status: None   Collection Time    05/07/14 12:55 AM      Result Value Ref Range Status   Specimen Description BLOOD LEFT HAND   Final   Special Requests BOTTLES DRAWN AEROBIC AND ANAEROBIC 5CC   Final   Culture  Setup Time     Final   Value: 05/07/2014 04:13     Performed at [facility]   Culture     Final   Value:        BLOOD CULTURE RECEIVED NO GROWTH TO DATE CULTURE WILL BE HELD FOR 5 DAYS BEFORE ISSUING A FINAL NEGATIVE REPORT     Performed at [facility]   Report Status PENDING   Incomplete  CULTURE, BLOOD (ROUTINE X 2)     Status: None   Collection Time    05/07/14  1:00 AM      Result Value Ref Range Status   Specimen Description BLOOD RIGHT ANTECUBITAL   Final   Special Requests BOTTLES DRAWN AEROBIC AND ANAEROBIC 5CC   Final   Culture  Setup Time     Final   Value: 05/07/2014 04:13     Performed at [facility]   Culture     Final   Value:        BLOOD CULTURE RECEIVED NO GROWTH TO DATE CULTURE WILL BE HELD FOR 5 DAYS BEFORE ISSUING A FINAL NEGATIVE REPORT     Performed at [facility]   Report Status PENDING   Incomplete  MRSA PCR SCREENING     Status: None   Collection Time    05/07/14  3:14 AM      Result Value Ref Range Status   MRSA by PCR NEGATIVE  NEGATIVE Final   Comment:            The GeneXpert MRSA Assay (FDA     approved for NASAL specimens     only), is one component of a     comprehensive MRSA colonization     surveillance program. It is not     intended to diagnose MRSA     infection nor to guide or     monitor treatment for     MRSA infections.  BODY FLUID CULTURE     Status: None   Collection Time     05/07/14  4:06 PM  Result Value Ref Range Status   Specimen Description PLEURAL   Final   Special Requests NONE   Final   Gram Stain     Final   Value: RARE WBC PRESENT, PREDOMINANTLY PMN     NO ORGANISMS SEEN     Performed at [facility]   Culture     Final   Value: NO GROWTH 2 DAYS     Performed at [facility]   Report Status PENDING   Incomplete     Scheduled Meds: . amLODipine  2.5 mg Oral Daily  . aspirin EC  81 mg Oral Daily  . atenolol  50 mg Oral BID  . budesonide-formoterol  2 puff Inhalation BID  . enoxaparin injection  40 mg Subcutaneous Q24H  . lisinopril  40 mg Oral q morning - 10a  . loratadine  10 mg Oral Daily  . pantoprazole  40 mg Oral Daily  . ZOSYN  IV  3.375 g Intravenous 3 times per day  . potassium chloride  10 mEq Intravenous Q1 Hr x 2  . predniSONE  2 mg Oral Q breakfast  . vancomycin  500 mg Intravenous Q12H   Continuous Infusions:  Erline Hau, MD  [facility] Pager [phone]  If 7PM-7AM, please contact night-coverage [email or web address] Password [email or web address] 05/11/2014, 12:03 PM   LOS: 5 days

## 2014-05-12 LAB — BASIC METABOLIC PANEL
BUN: 8 mg/dL (ref 6–23)
CO2: 25 mEq/L (ref 19–32)
CREATININE: 0.8 mg/dL (ref 0.50–1.10)
Calcium: 9.5 mg/dL (ref 8.4–10.5)
Chloride: 107 mEq/L (ref 96–112)
GFR, EST AFRICAN AMERICAN: 80 mL/min — AB (ref >90)
GFR, EST NON AFRICAN AMERICAN: 69 mL/min — AB (ref >90)
Glucose, Bld: 97 mg/dL (ref 70–99)
POTASSIUM: 4.2 meq/L (ref 3.7–5.3)
Sodium: 142 mEq/L (ref 137–147)

## 2014-05-12 NOTE — Progress Notes (Signed)
Patient ID: [name], female   DOB: Jul 25, 1934, 80 y.o.   MRN: [id number]   [facility] PROGRESS NOTE  MONAY HOULTON IOE:[id number] DOB: 1934/05/21 DOA: 05/06/2014 PCP: [name], MD  Brief narrative: 80 yo former smoker (quit 20 years ago) hx of esophogeal adenocarcinoma (post resection 3 years ago per Dr. Arlyce Dice) who presents with 2 weeks of cough, malaise, 2 days of fever and radiographic evidence of LLL PNA, small to mod pleural effusion, associated with hypoxia and requiring supplemental oxygen. Due to the fact she has declined, despite treatment, she has been admitted and PCCM asked to consult.   Active Problems:   Acute hypoxic respiratory failure in the setting of COPD  - PNA (presumed aspiration due to gastric pull-up), HCAP, L effusion  - continuing chronic steroids, oxygen as needed, BD's  - continue broad spectrum ABX Vancomycin and Zosyn day 3. - had thoracentesis 6/2 with removal of 600 cc. -incentive spirometry. -Repeat CT chest shows improved PNA. -Will switch to PO abx today-->Augmentin (would include anaerobe coverage for potential aspiration given h/o gastric pull up). -Will get PT/OT evals for dispo planning. Suspect will need SNF.    Leukocytosis  - secondary to HCAP, ABX as noted above and repeat CBC in AM -Improving.    Adenocarcinoma of gastroesophageal junction - follows with Dr. [name], will send him a note to alert him pt has been hospitalized     Hypertension -Hypotensive 6/3 with BP of 70/40. -BP improved and is becoming hypertensive today. -Will restart norvasc and atenolol 6/4. -Is again normotensive on BP regimen.    COPD (chronic obstructive pulmonary disease)  - oxygen as needed, BD's, chronic steroids  -Will try to wean oxygen as tolerated.    Hypokalemia - Repleted.    Anemia of chronic disease - Hg and Hct stable and at baseline - no signs of active bleeding - Lovenox for DVT prophylaxis     Severe  malnutrition - in the setting of acute illness - encourage PO intake   Consultants:  PCCM  Procedures/Studies: Dg Chest 2 View   05/07/2014   Worsening retrocardiac consolidation of small to moderate left pleural effusion. Recommend followup chest radiograph after treatment to verify improvement.  Suspected mild cardiomegaly with central pulmonary vasculature congestion.   Antibiotics:  Vancomycin 6/2 --> 6/5  Zosyn 6/2 --> 6/5  Augmentin 6/5-->  Code Status: Full Family Communication: Pt at bedside Disposition Plan: Awaiting SNF.  HPI/Subjective: No events overnight.   Objective: Filed Vitals:   05/11/14 2007 05/12/14 0539 05/12/14 0805 05/12/14 1416  BP: 174/68 129/62  134/59  Pulse: 89 70  96  Temp: 98.1 F (36.7 C) 97.6 F (36.4 C)  98.4 F (36.9 C)  TempSrc: Oral Oral  Oral  Resp: 18 18  18   Height:      Weight:  62.4 kg (137 lb 9.1 oz)    SpO2: 95% 98% 94% 95%    Intake/Output Summary (Last 24 hours) at 05/12/14 1457 Last data filed at 05/12/14 1200  Gross per 24 hour  Intake    843 ml  Output   1425 ml  Net   -582 ml    Exam:   General:  Pt is alert, follows commands appropriately, frail but NAD  Cardiovascular: Regular rate and rhythm, no rubs, no gallops  Respiratory: Poor inspiratory effort, diminished air movement bilaterally with no wheezing   Abdomen: Soft, non tender, non distended, bowel sounds present, no guarding  Extremities: No edema, pulses  DP and PT palpable bilaterally  Data Reviewed: Basic Metabolic Panel:  Recent Labs Lab 05/08/14 0507 05/09/14 0503 05/10/14 0436 05/11/14 0520 05/12/14 0530  NA 137 141 142 139 142  K 3.4* 3.5* 4.0 3.8 4.2  CL 102 106 108 105 107  CO2 23 23 24 23 25   GLUCOSE 104* 98 97 93 97  BUN 8 6 5* 6 8  CREATININE 0.82 0.87 0.89 0.74 0.80  CALCIUM 9.3 9.3 9.5 9.7 9.5   Liver Function Tests:  Recent Labs Lab 05/06/14 2345 05/07/14 0510  AST 21 18  ALT 6 <5  ALKPHOS 59 57  BILITOT 0.2*  0.2*  PROT 6.8 6.6  ALBUMIN 2.3* 2.4*   CBC:  Recent Labs Lab 05/06/14 2345 05/07/14 0510 05/09/14 0503  WBC 12.9* 11.1* 8.8  NEUTROABS 9.8* 8.3*  --   HGB 10.2* 10.0* 9.4*  HCT 32.2* 31.8* 30.8*  MCV 90.4 89.8 91.1  PLT 461* 464* 547*    Recent Results (from the past 240 hour(s))  CULTURE, BLOOD (ROUTINE X 2)     Status: None   Collection Time    05/07/14 12:55 AM      Result Value Ref Range Status   Specimen Description BLOOD LEFT HAND   Final   Special Requests BOTTLES DRAWN AEROBIC AND ANAEROBIC 5CC   Final   Culture  Setup Time     Final   Value: 05/07/2014 04:13     Performed at [facility]   Culture     Final   Value:        BLOOD CULTURE RECEIVED NO GROWTH TO DATE CULTURE WILL BE HELD FOR 5 DAYS BEFORE ISSUING A FINAL NEGATIVE REPORT     Performed at [facility]   Report Status PENDING   Incomplete  CULTURE, BLOOD (ROUTINE X 2)     Status: None   Collection Time    05/07/14  1:00 AM      Result Value Ref Range Status   Specimen Description BLOOD RIGHT ANTECUBITAL   Final   Special Requests BOTTLES DRAWN AEROBIC AND ANAEROBIC 5CC   Final   Culture  Setup Time     Final   Value: 05/07/2014 04:13     Performed at [facility]   Culture     Final   Value:        BLOOD CULTURE RECEIVED NO GROWTH TO DATE CULTURE WILL BE HELD FOR 5 DAYS BEFORE ISSUING A FINAL NEGATIVE REPORT     Performed at [facility]   Report Status PENDING   Incomplete  MRSA PCR SCREENING     Status: None   Collection Time    05/07/14  3:14 AM      Result Value Ref Range Status   MRSA by PCR NEGATIVE  NEGATIVE Final   Comment:            The GeneXpert MRSA Assay (FDA     approved for NASAL specimens     only), is one component of a     comprehensive MRSA colonization     surveillance program. It is not     intended to diagnose MRSA     infection nor to guide or     monitor treatment for     MRSA infections.  BODY FLUID CULTURE     Status: None    Collection Time    05/07/14  4:06 PM      Result Value Ref Range Status  Specimen Description PLEURAL   Final   Special Requests NONE   Final   Gram Stain     Final   Value: RARE WBC PRESENT, PREDOMINANTLY PMN     NO ORGANISMS SEEN     Performed at [facility]   Culture     Final   Value: NO GROWTH 3 DAYS     Performed at [facility]   Report Status 05/11/2014 FINAL   Final     Scheduled Meds: . amLODipine  2.5 mg Oral Daily  . aspirin EC  81 mg Oral Daily  . atenolol  50 mg Oral BID  . budesonide-formoterol  2 puff Inhalation BID  . enoxaparin injection  40 mg Subcutaneous Q24H  . lisinopril  40 mg Oral q morning - 10a  . loratadine  10 mg Oral Daily  . pantoprazole  40 mg Oral Daily  . ZOSYN  IV  3.375 g Intravenous 3 times per day  . potassium chloride  10 mEq Intravenous Q1 Hr x 2  . predniSONE  2 mg Oral Q breakfast  . vancomycin  500 mg Intravenous Q12H   Continuous Infusions:  Erline Hau, MD  [facility] Pager [phone]  If 7PM-7AM, please contact night-coverage [email or web address] Password [email or web address] 05/12/2014, 2:57 PM   LOS: 6 days

## 2014-05-13 LAB — BASIC METABOLIC PANEL
BUN: 8 mg/dL (ref 6–23)
CALCIUM: 9.5 mg/dL (ref 8.4–10.5)
CHLORIDE: 105 meq/L (ref 96–112)
CO2: 22 meq/L (ref 19–32)
Creatinine, Ser: 0.75 mg/dL (ref 0.50–1.10)
GFR calc non Af Amer: 80 mL/min — ABNORMAL LOW (ref >90)
Glucose, Bld: 96 mg/dL (ref 70–99)
Potassium: 3.7 mEq/L (ref 3.7–5.3)
SODIUM: 140 meq/L (ref 137–147)

## 2014-05-13 LAB — CULTURE, BLOOD (ROUTINE X 2)
CULTURE: NO GROWTH
Culture: NO GROWTH

## 2014-05-13 MED ORDER — LORAZEPAM 0.5 MG PO TABS: 0.5000 mg | ORAL_TABLET | Freq: Three times a day (TID) | ORAL | Status: DC | PRN

## 2014-05-13 MED ORDER — TRAMADOL HCL 50 MG PO TABS: 50.0000 mg | ORAL_TABLET | Freq: Four times a day (QID) | ORAL | Status: DC | PRN

## 2014-05-13 MED ORDER — AMOXICILLIN-POT CLAVULANATE 875-125 MG PO TABS: 1.0000 | ORAL_TABLET | Freq: Two times a day (BID) | ORAL | Status: DC

## 2014-05-13 MED ORDER — IBUPROFEN 400 MG PO TABS: 400.0000 mg | ORAL_TABLET | Freq: Three times a day (TID) | ORAL | Status: DC | PRN

## 2014-05-13 NOTE — Discharge Summary (Signed)
Physician Discharge Summary  [name]:[id number] DOB: [birth year] DOA: 05/06/2014  PCP: [name], MD  Admit date: 05/06/2014 Discharge date: 05/13/2014  Time spent: 45 minutes  Recommendations for Outpatient Follow-up:  -Will be discharged to SNF today. -Has 6 days remaining of Augmentin for her PNA.   Discharge Diagnoses:  Active Problems:   Pernicious anemia   Adenocarcinoma of gastroesophageal junction   Hypertension   COPD (chronic obstructive pulmonary disease)   Acute respiratory failure with hypoxia   HCAP (healthcare-associated pneumonia)   Acute respiratory failure   Discharge Condition: Stable and improved  Filed Weights   05/11/14 0522 05/12/14 0539 05/13/14 0439  Weight: 62.7 kg (138 lb 3.7 oz) 62.4 kg (137 lb 9.1 oz) 61.326 kg (135 lb 3.2 oz)    History of present illness:  [name] is a 80 y.o. female with history of adenocarcinoma of the GE junction who was just admitted for pneumonia presents to the ER because of worsening shortness of breath with productive cough and weakness. Patient states she has not been able to eat well because of nausea and coughing. Denies any abdominal pain. Has been having persistent diarrhea. In the ER chest x-ray shows worsening of the pneumonia from prior. Patient has been started on empiric antibiotics and admitted for further management. Patient otherwise denies any chest pain abdominal pain. In the ER patient has required at least 4 L of nasal cannula oxygen to maintain saturation. Hospitalist admission was requested.   Hospital Course:   Acute hypoxic respiratory failure in the setting of COPD  - PNA (presumed aspiration due to gastric pull-up), HCAP, L effusion  - had thoracentesis 6/2 with removal of 600 cc.  -incentive spirometry.  -Repeat CT chest shows improved PNA.  -Will switch to PO abx -->Augmentin (would include anaerobe coverage for potential aspiration given h/o gastric pull up). Has 6  days remaining on DC.  Leukocytosis  - secondary to HCAP, ABX as noted above and repeat CBC in AM  -Improving.   Adenocarcinoma of gastroesophageal junction  - follows with Dr. [name].  Hypertension  -Hypotensive 6/3 with BP of 70/40.  -BP improved and became hypertensive again.  -Norvasc and atenolol restarted 6/4.  -Is again normotensive on BP regimen.   COPD (chronic obstructive pulmonary disease)  - oxygen as needed, BD's, chronic steroids  -no oxygen requirements on DC.  Hypokalemia  - Repleted.   Anemia of chronic disease  - Hg and Hct stable and at baseline  - no signs of active bleeding  - Lovenox for DVT prophylaxis   Severe malnutrition  - in the setting of acute illness  - encourage PO intake       Consultations:  Pulmonary  Discharge Instructions  Discharge Instructions   Diet - low sodium heart healthy    Complete by:  As directed      Discontinue IV    Complete by:  As directed      Increase activity slowly    Complete by:  As directed             Medication List    STOP taking these medications       lisinopril 40 MG tablet  Commonly known as:  PRINIVIL,ZESTRIL      TAKE these medications       acetaminophen 500 MG tablet  Commonly known as:  TYLENOL  Take 500 mg by mouth every 6 (six) hours as needed for mild pain or headache.  acetic acid-hydrocortisone otic solution  Commonly known as:  VOSOL-HC  Place 1 drop into both ears 3 (three) times daily as needed (pain and itching).     albuterol 108 (90 BASE) MCG/ACT inhaler  Commonly known as:  PROVENTIL HFA;VENTOLIN HFA  Inhale 2 puffs into the lungs every 6 (six) hours as needed for wheezing (wheezing).     amLODipine 2.5 MG tablet  Commonly known as:  NORVASC  Take 1 tablet by mouth daily.     amoxicillin-clavulanate 875-125 MG per tablet  Commonly known as:  AUGMENTIN  Take 1 tablet by mouth every 12 (twelve) hours. For 6 more days     aspirin EC 81 MG tablet  Take  81 mg by mouth daily.     atenolol 50 MG tablet  Commonly known as:  TENORMIN  Take 50 mg by mouth 2 (two) times daily.     budesonide-formoterol 160-4.5 MCG/ACT inhaler  Commonly known as:  SYMBICORT  Inhale 2 puffs into the lungs 2 (two) times daily.     calcium-vitamin D 500-200 MG-UNIT per tablet  Commonly known as:  OSCAL WITH D  Take 1 tablet by mouth daily.     diphenoxylate-atropine 2.5-0.025 MG per tablet  Commonly known as:  LOMOTIL  Take 1 tablet by mouth 4 (four) times daily as needed for diarrhea or loose stools.     esomeprazole 20 MG capsule  Commonly known as:  NEXIUM  Take 20 mg by mouth daily at 12 noon.     furosemide 20 MG tablet  Commonly known as:  LASIX  Take 20 mg by mouth daily as needed for fluid or edema.     hydroxypropyl methylcellulose 2.5 % ophthalmic solution  Commonly known as:  ISOPTO TEARS  Place 1 drop into both eyes 3 (three) times daily as needed for dry eyes.     ibuprofen 400 MG tablet  Commonly known as:  ADVIL,MOTRIN  Take 1 tablet (400 mg total) by mouth every 8 (eight) hours as needed (pain).     loratadine 10 MG tablet  Commonly known as:  CLARITIN  Take 10 mg by mouth daily.     LORazepam 0.5 MG tablet  Commonly known as:  ATIVAN  Take 1 tablet (0.5 mg total) by mouth every 8 (eight) hours as needed for anxiety.     predniSONE 1 MG tablet  Commonly known as:  DELTASONE  Take 2 mg by mouth daily with breakfast. Takes 2 tablets daily     raloxifene 60 MG tablet  Commonly known as:  EVISTA  Take 60 mg by mouth every morning.     saccharomyces boulardii 250 MG capsule  Commonly known as:  FLORASTOR  Take 1 capsule (250 mg total) by mouth 2 (two) times daily.     sodium chloride 0.65 % Soln nasal spray  Commonly known as:  OCEAN  Place 1 spray into both nostrils as needed for congestion.     ST JOHNS WORT PO  Take 1 capsule by mouth 2 (two) times daily.     tiZANidine 4 MG tablet  Commonly known as:  ZANAFLEX    Take 2-4 mg by mouth every 8 (eight) hours as needed for muscle spasms.     traMADol 50 MG tablet  Commonly known as:  ULTRAM  Take 1 tablet (50 mg total) by mouth every 6 (six) hours as needed for moderate pain.     Vitamin D3 50000 UNITS Caps  Take 1 capsule by mouth  every 14 (fourteen) days.     white petrolatum Gel  Commonly known as:  VASELINE  Apply 1 application topically as needed (anterior nares to prevent nose bleed).       Allergies  Allergen Reactions  . Keflex [Cephalexin] Itching    "severe itching"  . Nulecit [Na Ferric Gluc Cplx In Sucrose] Diarrhea, Nausea Only and Other (See Comments)      drop in BP  . Spiriva Handihaler [Tiotropium Bromide Monohydrate] Other (See Comments)    constipation  . Tramadol Itching    Skin burning and itching  . Adhesive [Tape] Itching  . Clarithromycin Itching  . Darvon Itching  . Epinephrine Itching  . Morphine And Related Itching  . Oxycodone Itching  . Sulfa Antibiotics Itching  . Vicodin [Hydrocodone-Acetaminophen] Itching    Can take with Benadryl      The results of significant diagnostics from this hospitalization (including imaging, microbiology, ancillary and laboratory) are listed below for reference.    Significant Diagnostic Studies: Dg Chest 1 View  05/07/2014   CLINICAL DATA:  Post left-sided thoracentesis  EXAM: CHEST - 1 VIEW  COMPARISON:  05/25/2014; 04/25/2014; left-sided ultrasound-guided thoracentesis - earlier same day  FINDINGS: Grossly unchanged enlarged cardiac silhouette and mediastinal contours. Interval reduction in small residual left-sided pleural effusion and post thoracentesis. No pneumothorax. The lungs remain hyperexpanded. Improved aeration of the left lower lung with persistent bibasilar heterogeneous opacities, left greater than right. No new focal airspace opacities. Unchanged bones. Model scoliotic curvature of the thoracolumbar spine. Post multilevel vertebroplasty / kyphoplasty,  incompletely evaluated.  IMPRESSION: 1. Interval reduction in residual small left-sided pleural effusion post thoracentesis. No pneumothorax. 2. Improved aeration of lungs with persistent bibasilar opacities, left greater than right, atelectasis versus infiltrate.   Electronically Signed   By: Sandi Mariscal M.D.   On: 05/07/2014 16:29   Dg Chest 2 View  05/07/2014   CLINICAL DATA:  Shortness of breath and chest pain.  EXAM: CHEST  2 VIEW  COMPARISON:  Chest radiograph May 01, 2014.  FINDINGS: Worsening retrocardiac consolidation, and small to moderate left pleural effusion, increased. The cardiac silhouette is likely mildly enlarged though partially obscured. Tortuous, possibly ectatic aorta with calcified aortic knob. Mild central pulmonary vasculature congestion, increased. No pneumothorax.  Surgical clips in the left neck may reflect thyroidectomy. Sub cm calcifications projecting at the proximal humerus bilaterally, unchanged. Osteopenia, with multilevel mid thoracic vertebral body cement augmentation. Surgical clips in the left abdomen.  IMPRESSION: Worsening retrocardiac consolidation of small to moderate left pleural effusion. Recommend followup chest radiograph after treatment to verify improvement.  Suspected mild cardiomegaly with central pulmonary vasculature congestion.   Electronically Signed   By: Elon Alas   On: 05/07/2014 00:26   Dg Chest 2 View  05/01/2014   CLINICAL DATA:  Short of breath.  Followup pneumonia.  EXAM: CHEST  2 VIEW  COMPARISON:  04/25/2014  FINDINGS: Areas of lung consolidation have improved. There is still mild left lung base opacity which may reflect residual pneumonia, atelectasis or a combination. A small left pleural effusion is noted.  Cardiac silhouette is normal in size. Prominence of the descending thoracic aorta is unchanged. No mediastinal or hilar masses. No pneumothorax.  IMPRESSION: 1. Improved lung infiltrates. 2. Small left pleural effusion. Mild residual  left lung base opacity most likely atelectasis. No pulmonary edema.   Electronically Signed   By: Lajean Manes M.D.   On: 05/01/2014 10:33   Ct Chest Wo Contrast  05/08/2014  CLINICAL DATA:  80 year old female with history of esophageal adenocarcinoma resected 3 years ago. Two weeks of cough, malacia is, fever, abnormal left lower lobe on chest radiograph. Initial encounter.  EXAM: CT CHEST WITHOUT CONTRAST  TECHNIQUE: Multidetector CT imaging of the chest was performed following the standard protocol without IV contrast.  COMPARISON:  Portable chest radiograph 05/07/2014. Chest CTA 04/25/2014.  FINDINGS: Sequelae of gastric pull-through. Negative visualized thoracic inlet. Stable small mediastinal lymph nodes. No lymphadenopathy is identified in the absence of IV contrast. Noncontrast visible upper abdominal viscera appear stable. Extensive aortic calcified atherosclerosis. Extensive coronary artery calcified atherosclerosis.  No pericardial effusion. Moderate layering bilateral pleural effusions, slightly greater on the left.  Major airways are patent. There is compressive atelectasis of the right lower lobe. There is greater opacification of the left lower lobe with some air bronchograms (series 5, image 32). Small hyperdense foci in might reflect calcifications in the collapsed or consolidated lower lobes, uncertain.  Centrilobular in occasional paraseptal emphysema re- identified in the upper lobes which are stable. Mild opacity in the lingula. The right middle lobe is clear.  Multilevel augmented thoracic compression fractures re- identified. Stable visualized osseous structures. No destructive or suspicious osseous lesion identified.  IMPRESSION: 1. Moderate layering pleural effusions greater on the left. Airspace opacification in the left lower lobe has decreased since the 04/25/2014 CTA, but lower lobe consolidation remains. On the right there is mainly compressive atelectasis. 2. Otherwise stable chest  status post gastric pull-through.   Electronically Signed   By: Lars Pinks M.D.   On: 05/08/2014 14:59   Ct Angio Chest Pe W/cm &/or Wo Cm  04/25/2014   EXAM: CT ANGIOGRAPHY CHEST WITH CONTRAST  TECHNIQUE: Multidetector CT imaging of the chest was performed using the standard protocol during bolus administration of intravenous contrast. Multiplanar CT image reconstructions and MIPs were obtained to evaluate the vascular anatomy.  CONTRAST:  133mL OMNIPAQUE IOHEXOL 350 MG/ML SOLN  COMPARISON:  12/12/2012.  FINDINGS: No evidence for pulmonary emboli. Atheromatous change transverse arch. Sequelae of gastric pull-through, with dilated fluid-filled gastric pull up.  Atherosclerotic changes greatest at the descending thoracic cord are. Coronary artery calcifications with cardiomegaly. Slight pericardial effusion.  Moderate bilateral pleural effusions. COPD. Tiny bilateral pulmonary nodules) image 39, 41) noted previously not calcified.  Extensive left lower lobe consolidation involving the superior segment and posterior basal segment could represent aspiration. Air-fluid level as seen on image 29 series 7 could represent pulmonary abscess versus layering fluid within an air cyst. Minor infiltrates or atelectatic changes right base.  Chronic compression deformity T5. Treated vertebral body compression fractures with vertebral augmentation at T6, T8, and T9. No worrisome osseous lesions.  Upper abdominal organs unremarkable.  Review of the MIP images confirms the above findings.  IMPRESSION: Extensive left lower lobe consolidation involving superior segment and posterior basal segments could represent aspiration pneumonia in this patient with a gastric pull-through and a dilated fluid-filled gastric pull up.  Air-fluid level could represent pulmonary abscess versus layering fluid within air cyst.  Bilateral pleural effusions.  No evidence for pulmonary emboli.   Electronically Signed   By: Rolla Flatten M.D.   On:  04/25/2014 15:42   Dg Esophagus  04/26/2014   CLINICAL DATA:  80 year old female with history of esophageal cancer, partial esophagectomy and gastric pull-through. Also treated with radiation. Admitted with pneumonia, difficulty swallowing, coughing, and choking. Initial encounter.  EXAM: ESOPHOGRAM/BARIUM SWALLOW  TECHNIQUE: Single contrast examination was performed using  barium.  FLUOROSCOPY TIME:  1 min 38 seconds.  COMPARISON:  Esophagram 12/27/2011. Chest and abdomen CT 12/12/2012.  FINDINGS: Due to limited mobility, the study was performed with the fluoroscopy table inclined at 45 degrees. A single contrast study was performed.  The patient tolerated PO barium well and without difficulty.  The patient did cough after some swallows of barium, but no definite aspiration was identified.  Sequelae of partial esophagectomy and gastric pull-through are noted. No obstruction to the Ford flow of barium into the gastric pull-through. Barium was slow to empty from the intra thoracic stomach into the abdomen, but at the conclusion of the study about half of the ingested barium had reached the duodenum C loop.  No anastomotic stricture identified.  No extravasation.  Numerous surgical clips in the mediastinum. Postprocedural abdominal film demonstrating extensive flank soft tissue calcifications, and previous bilateral hip arthroplasty.  IMPRESSION: 1. Satisfactory swallow study in this patient who is status post subtotal esophagectomy and gastric pull-through. 2. Some coughing was noted with swallowing during this study, raising the possibility of aspiration, but no definite aspiration was identified. A followup modified barium swallow would be more sensitive.   Electronically Signed   By: Lars Pinks M.D.   On: 04/26/2014 11:51   Dg Chest Portable 1 View  04/25/2014   CLINICAL DATA:  Weakness and shortness of breath.  COPD.  EXAM: PORTABLE CHEST - 1 VIEW  COMPARISON:  02/07/2014.  FINDINGS: Cardiomegaly persists.  Calcified tortuous aorta. Mild vascular congestion. Slight left pleural effusion. Osteopenia.  IMPRESSION: Cardiomegaly with mild vascular congestion. Small left effusion. Worsening aeration from priors.   Electronically Signed   By: Rolla Flatten M.D.   On: 04/25/2014 14:17   US Thoracentesis Asp Pleural Space W/img Guide  05/07/2014   CLINICAL DATA:  Patient with past history of esophageal adenocarcinoma; now with pneumonia, left pleural effusion, posterior left chest discomfort, dyspnea. Request is made for diagnostic and therapeutic left thoracentesis  EXAM: ULTRASOUND GUIDED DIAGNOSTIC AND THERAPEUTIC LEFT THORACENTESIS  COMPARISON:  None.  PROCEDURE: An ultrasound guided thoracentesis was thoroughly discussed with the patient and questions answered. The benefits, risks, alternatives and complications were also discussed. The patient understands and wishes to proceed with the procedure. Written consent was obtained.  Ultrasound was performed to localize and mark an adequate pocket of fluid in the left chest. The area was then prepped and draped in the normal sterile fashion. 1% Lidocaine was used for local anesthesia. Under ultrasound guidance a 19 gauge Yueh catheter was introduced. Thoracentesis was performed. The catheter was removed and a dressing applied.  Complications:  None.  FINDINGS: A total of approximately 600 cc's of slightly turbid, amber fluid was removed. The fluid sample wassent for laboratory analysis.  IMPRESSION: Successful ultrasound guided diagnostic and therapeutic left thoracentesis yielding 600 cc's of pleural fluid.  Read by: Rowe Robert ,P.A.-C.   Electronically Signed   By: Sandi Mariscal M.D.   On: 05/07/2014 16:11    Microbiology: Recent Results (from the past 240 hour(s))  CULTURE, BLOOD (ROUTINE X 2)     Status: None   Collection Time    05/07/14 12:55 AM      Result Value Ref Range Status   Specimen Description BLOOD LEFT HAND   Final   Special Requests BOTTLES DRAWN  AEROBIC AND ANAEROBIC 5CC   Final   Culture  Setup Time     Final   Value: 05/07/2014 04:13     Performed at [facility]  Final   Value: NO GROWTH 5 DAYS     Performed at [facility]   Report Status 05/13/2014 FINAL   Final  CULTURE, BLOOD (ROUTINE X 2)     Status: None   Collection Time    05/07/14  1:00 AM      Result Value Ref Range Status   Specimen Description BLOOD RIGHT ANTECUBITAL   Final   Special Requests BOTTLES DRAWN AEROBIC AND ANAEROBIC 5CC   Final   Culture  Setup Time     Final   Value: 05/07/2014 04:13     Performed at [facility]   Culture     Final   Value: NO GROWTH 5 DAYS     Performed at [facility]   Report Status 05/13/2014 FINAL   Final  MRSA PCR SCREENING     Status: None   Collection Time    05/07/14  3:14 AM      Result Value Ref Range Status   MRSA by PCR NEGATIVE  NEGATIVE Final   Comment:            The GeneXpert MRSA Assay (FDA     approved for NASAL specimens     only), is one component of a     comprehensive MRSA colonization     surveillance program. It is not     intended to diagnose MRSA     infection nor to guide or     monitor treatment for     MRSA infections.  BODY FLUID CULTURE     Status: None   Collection Time    05/07/14  4:06 PM      Result Value Ref Range Status   Specimen Description PLEURAL   Final   Special Requests NONE   Final   Gram Stain     Final   Value: RARE WBC PRESENT, PREDOMINANTLY PMN     NO ORGANISMS SEEN     Performed at [facility]   Culture     Final   Value: NO GROWTH 3 DAYS     Performed at [facility]   Report Status 05/11/2014 FINAL   Final     Labs: Basic Metabolic Panel:  Recent Labs Lab 05/09/14 0503 05/10/14 0436 05/11/14 0520 05/12/14 0530 05/13/14 0437  NA 141 142 139 142 140  K 3.5* 4.0 3.8 4.2 3.7  CL 106 108 105 107 105  CO2 23 24 23 25 22   GLUCOSE 98 97 93 97 96  BUN 6 5* 6 8 8   CREATININE 0.87 0.89 0.74  0.80 0.75  CALCIUM 9.3 9.5 9.7 9.5 9.5   Liver Function Tests:  Recent Labs Lab 05/06/14 2345 05/07/14 0510  AST 21 18  ALT 6 <5  ALKPHOS 59 57  BILITOT 0.2* 0.2*  PROT 6.8 6.6  ALBUMIN 2.3* 2.4*   No results found for this basename: LIPASE, AMYLASE,  in the last 168 hours No results found for this basename: AMMONIA,  in the last 168 hours CBC:  Recent Labs Lab 05/06/14 2345 05/07/14 0510 05/09/14 0503  WBC 12.9* 11.1* 8.8  NEUTROABS 9.8* 8.3*  --   HGB 10.2* 10.0* 9.4*  HCT 32.2* 31.8* 30.8*  MCV 90.4 89.8 91.1  PLT 461* 464* 547*   Cardiac Enzymes: No results found for this basename: CKTOTAL, CKMB, CKMBINDEX, TROPONINI,  in the last 168 hours BNP: BNP (last 3 results)  Recent Labs  05/06/14 2345  PROBNP 5856.0*  CBG: No results found for this basename: GLUCAP,  in the last 168 hours     Signed:  Erline Hau  [facility] Pager: [phone] 05/13/2014, 11:32 AM

## 2014-05-13 NOTE — Progress Notes (Signed)
Patient is set to discharge to [facility] SN[facility] today. Patient & son aware - son to transport to SNF this afternoon. Discharge packet in [place], [place] aware.   Clinical Social Work Department CLINICAL SOCIAL WORK PLACEMENT NOTE 05/13/2014  Patient:  BELEM, HINTZE  Account Number:  [id number] Admit date:  05/06/2014  Clinical Social Worker:  Renold Genta  Date/time:  05/13/2014 11:53 AM  Clinical Social Work is seeking post-discharge placement for this patient at the following level of care:   SKILLED NURSING   (*CSW will update this form in Epic as items are completed)   05/13/2014  Patient/family provided with [place] Department of Clinical Social Work's list of facilities offering this level of care within the geographic area requested by the patient (or if unable, by the patient's family).  05/13/2014  Patient/family informed of their freedom to choose among providers that offer the needed level of care, that participate in Medicare, Medicaid or managed care program needed by the patient, have an available bed and are willing to accept the patient.  05/13/2014  Patient/family informed of [facility]' ownership interest in [facility], as well as of the fact that they are under no obligation to receive care at this facility.  PASARR submitted to EDS on 05/13/2014 PASARR number received on 05/13/2014  FL2 transmitted to all facilities in geographic area requested by pt/family on  05/13/2014 FL2 transmitted to all facilities within larger geographic area on   Patient informed that his/her managed care company has contracts with or will negotiate with  certain facilities, including the following:     Patient/family informed of bed offers received:  05/13/2014 Patient chooses bed at [facility] [place] Physician recommends and patient chooses bed at    Patient to be transferred to [place] on   05/13/2014 Patient to be transferred to facility by son's car Patient and family notified of transfer on  Name of family member notified:    The following physician request were entered in Epic:   Additional Comments:   Raynaldo Opitz, [place] Social Worker cell #: [phone]

## 2014-05-14 ENCOUNTER — Non-Acute Institutional Stay (SKILLED_NURSING_FACILITY): Payer: Medicare Other | Admitting: Internal Medicine

## 2014-05-14 ENCOUNTER — Encounter: Payer: Self-pay | Admitting: Internal Medicine

## 2014-05-14 DIAGNOSIS — J449 Chronic obstructive pulmonary disease, unspecified: Secondary | ICD-10-CM

## 2014-05-14 DIAGNOSIS — J690 Pneumonitis due to inhalation of food and vomit: Secondary | ICD-10-CM

## 2014-05-14 DIAGNOSIS — J90 Pleural effusion, not elsewhere classified: Secondary | ICD-10-CM

## 2014-05-14 DIAGNOSIS — M810 Age-related osteoporosis without current pathological fracture: Secondary | ICD-10-CM | POA: Insufficient documentation

## 2014-05-14 DIAGNOSIS — C160 Malignant neoplasm of cardia: Secondary | ICD-10-CM

## 2014-05-14 DIAGNOSIS — J9600 Acute respiratory failure, unspecified whether with hypoxia or hypercapnia: Secondary | ICD-10-CM

## 2014-05-14 DIAGNOSIS — D510 Vitamin B12 deficiency anemia due to intrinsic factor deficiency: Secondary | ICD-10-CM

## 2014-05-14 DIAGNOSIS — R1310 Dysphagia, unspecified: Secondary | ICD-10-CM

## 2014-05-14 DIAGNOSIS — I10 Essential (primary) hypertension: Secondary | ICD-10-CM

## 2014-05-14 DIAGNOSIS — J4489 Other specified chronic obstructive pulmonary disease: Secondary | ICD-10-CM

## 2014-05-14 NOTE — ED Provider Notes (Signed)
Medical screening examination/treatment/procedure(s) were performed by non-physician practitioner and as supervising physician I was immediately available for consultation/collaboration.   Hoy Morn, MD 05/14/14 [phone]

## 2014-05-14 NOTE — Progress Notes (Signed)
Patient ID: [name], female   DOB: 03/27/1934, 80 y.o.   MRN: [id number]  Provider:  Rexene Edison. Mariea Clonts, D.O., C.M.D. Location:  [facility]   PCP: [name], MD  Code Status: full code  Allergies  Allergen Reactions  . Keflex [Cephalexin] Itching    "severe itching"  . Nulecit [Na Ferric Gluc Cplx In Sucrose] Diarrhea, Nausea Only and Other (See Comments)      drop in BP  . Spiriva Handihaler [Tiotropium Bromide Monohydrate] Other (See Comments)    constipation  . Tramadol Itching    Skin burning and itching  . Adhesive [Tape] Itching  . Clarithromycin Itching  . Darvon Itching  . Epinephrine Itching  . Morphine And Related Itching  . Oxycodone Itching  . Sulfa Antibiotics Itching  . Vicodin [Hydrocodone-Acetaminophen] Itching    Can take with Benadryl    Chief Complaint  Patient presents with  . Hospitalization Follow-up    new admisison for short term rehab s/p hospitalization for worsening shortness of breath with productive cough and weakness.    HPI: 80 y.o. female with h/o adenocarcinoma of the GE junction, OA, fibromyalgia, emphysema, psoriasis, scoliosis, osteoporosis, pernicious anemia was admitted here for short term rehab s/p hospitalization 6/1-05/13/14 with shortness of breath, cough and weakness and diarrhea.  She was found to have acute hypoxic respiratory failure from health care associated pneumonia felt to be aspiration type.  She had a left pleural effusion with thoracentesis removing 600 cc.  A repeat CT showed improvement and she was switched to augmentin orally (to finish 6 more days here).    She follows with Dr. [name] for her adenoca of the GE junction.    She is on chronic steroids for her COPD and uses O2 prn (did not need at hospital d/c).   When seen, she told me that she has continued with vertebral compression fxs despite being on raloxifene for many years, and wanted to know if there was an alternative agent for  her.    ROS: Review of Systems  Constitutional: Positive for weight loss and malaise/fatigue. Negative for fever.  HENT: Positive for congestion.   Eyes: Negative for blurred vision.  Respiratory: Positive for cough. Negative for sputum production.   Cardiovascular: Negative for chest pain.  Gastrointestinal: Positive for heartburn and abdominal pain. Negative for diarrhea, constipation, blood in stool and melena.  Genitourinary: Negative for dysuria.  Musculoskeletal: Negative for falls.  Skin: Negative for rash.  Neurological: Positive for weakness. Negative for dizziness, loss of consciousness and headaches.  Endo/Heme/Allergies: Bruises/bleeds easily.  Psychiatric/Behavioral: Positive for depression. Negative for memory loss.    Past Medical History  Diagnosis Date  . Pernicious anemia   . Osteoarthritis   . Fibromyalgia   . Skin lesion     chronic calcific cutaneous lesions  . HX of multiple bleeding ulcers 09/01/2011  . Hypertension     takes Atenolol daily  . Emphysema   . Bronchitis     hx  . Adenocarcinoma of gastroesophageal junction   . Dizziness     d/t crystals in ears that "roll around"and can't get them out  . Back pain   . Osteoporosis   . Scoliosis   . Psoriasis   . H/O hiatal hernia   . GERD (gastroesophageal reflux disease)     takes Aciphex daily  . Gastric ulcer   . Constipation     since Chemo and Radiation;finished up in Nov 2012  . Diverticulosis   .  History of colonic polyps   . Shingles     broke out 2wks ago;has been on meds and to finish up tomorrow  . Kidney stone 25+yrs ago  . Pernicious anemia   . Vitamin B deficiency   . Complication of anesthesia     shortness of breath and pain with gas   . COPD (chronic obstructive pulmonary disease)   . Shortness of breath     with exertion   . Blood transfusion    Past Surgical History  Procedure Laterality Date  . Right hip replacement  2001  . Tonsillectomy      as a child  . Tubal  ligation  1975  . Appendectomy  1975  . Cataract surgery  10+yrs ago  . Esophagogastroduodenoscopy    . Colonoscopy    . Extubation  12/20/2011       . Partial esophagectomy  12/20/2011    Procedure: ESOPHAGECTOMY PARTIAL;  Surgeon: Pierre Bali, MD;  Location: [facility];  Service: Thoracic;  Laterality: N/A;  . Total hip arthroplasty  06/30/2012    Procedure: TOTAL HIP ARTHROPLASTY ANTERIOR APPROACH;  Surgeon: Mcarthur Rossetti, MD;  Location: [facility];  Service: Orthopedics;  Laterality: Left;  Left total hip arthroplasty   Social History:   reports that she quit smoking about 22 years ago. Her smoking use included Cigarettes. She smoked 3.00 packs per day. She has never used smokeless tobacco. She reports that she does not drink alcohol or use illicit drugs.  Family History  Problem Relation Age of Onset  . Cancer Maternal Grandmother     gynecologic  . Cancer Maternal Aunt     gynecologic  . Breast cancer Paternal Aunt   . Breast cancer Paternal Aunt   . Breast cancer Paternal Aunt   . Anesthesia problems Neg Hx   . Hypotension Neg Hx   . Malignant hyperthermia Neg Hx   . Pseudochol deficiency Neg Hx     Medications: Patient's Medications  New Prescriptions   No medications on file  Previous Medications   ACETAMINOPHEN (TYLENOL) 500 MG TABLET    Take 500 mg by mouth every 6 (six) hours as needed for mild pain or headache.   ACETIC ACID-HYDROCORTISONE (VOSOL-HC) OTIC SOLUTION    Place 1 drop into both ears 3 (three) times daily as needed (pain and itching).    ALBUTEROL (PROVENTIL HFA;VENTOLIN HFA) 108 (90 BASE) MCG/ACT INHALER    Inhale 2 puffs into the lungs every 6 (six) hours as needed for wheezing (wheezing).   AMLODIPINE (NORVASC) 2.5 MG TABLET    Take 1 tablet by mouth daily.   ASPIRIN EC 81 MG TABLET    Take 81 mg by mouth daily.   ATENOLOL (TENORMIN) 50 MG TABLET    Take 50 mg by mouth 2 (two) times daily.   BUDESONIDE-FORMOTEROL (SYMBICORT) 160-4.5 MCG/ACT  INHALER    Inhale 2 puffs into the lungs 2 (two) times daily.   CALCIUM-VITAMIN D (OSCAL WITH D) 500-200 MG-UNIT PER TABLET    Take 1 tablet by mouth daily.   CHOLECALCIFEROL (VITAMIN D3) 50000 UNITS CAPS    Take 1 capsule by mouth every 14 (fourteen) days.    DIPHENOXYLATE-ATROPINE (LOMOTIL) 2.5-0.025 MG PER TABLET    Take 1 tablet by mouth 4 (four) times daily as needed for diarrhea or loose stools.   ESOMEPRAZOLE (NEXIUM) 20 MG CAPSULE    Take 20 mg by mouth daily at 12 noon.   FUROSEMIDE (LASIX) 20 MG TABLET  Take 20 mg by mouth daily as needed for fluid or edema.    HYDROXYPROPYL METHYLCELLULOSE (ISOPTO TEARS) 2.5 % OPHTHALMIC SOLUTION    Place 1 drop into both eyes 3 (three) times daily as needed for dry eyes.   IBUPROFEN (ADVIL,MOTRIN) 400 MG TABLET    Take 1 tablet (400 mg total) by mouth every 8 (eight) hours as needed (pain).   LORATADINE (CLARITIN) 10 MG TABLET    Take 10 mg by mouth daily.   LORAZEPAM (ATIVAN) 0.5 MG TABLET    Take 1 tablet (0.5 mg total) by mouth every 8 (eight) hours as needed for anxiety.   PREDNISONE (DELTASONE) 1 MG TABLET    Take 2 mg by mouth daily with breakfast. Takes 2 tablets daily   RALOXIFENE (EVISTA) 60 MG TABLET    Take 60 mg by mouth every morning.    SACCHAROMYCES BOULARDII (FLORASTOR) 250 MG CAPSULE    Take 1 capsule (250 mg total) by mouth 2 (two) times daily.   SODIUM CHLORIDE (OCEAN) 0.65 % SOLN NASAL SPRAY    Place 1 spray into both nostrils as needed for congestion.   ST JOHNS WORT PO    Take 1 capsule by mouth 2 (two) times daily.   TIZANIDINE (ZANAFLEX) 4 MG TABLET    Take 2-4 mg by mouth every 8 (eight) hours as needed for muscle spasms.    TRAMADOL (ULTRAM) 50 MG TABLET    Take 1 tablet (50 mg total) by mouth every 6 (six) hours as needed for moderate pain.   WHITE PETROLATUM (VASELINE) GEL    Apply 1 application topically as needed (anterior nares to prevent nose bleed).  Modified Medications   Modified Medication Previous Medication    AMOXICILLIN-CLAVULANATE (AUGMENTIN) 875-125 MG PER TABLET amoxicillin-clavulanate (AUGMENTIN) 875-125 MG per tablet      Take 1 tablet by mouth every 12 (twelve) hours. For 6 more days    Take 1 tablet by mouth every 12 (twelve) hours. For 6 more days  Discontinued Medications   No medications on file     Physical Exam: Filed Vitals:   05/14/14 1141  BP: 145/76  Pulse: 92  Temp: 97.4 F (36.3 C)  Resp: 16  Height: 5\' 4"  (1.626 m)  Weight: 136 lb (61.689 kg)  SpO2: 90%  Physical Exam  Constitutional: She is oriented to person, place, and time. No distress.  White female resting in bed watching soap operas  HENT:  Head: Normocephalic and atraumatic.  Right Ear: External ear normal.  Left Ear: External ear normal.  Nose: Nose normal.  Mouth/Throat: Oropharynx is clear and moist. No oropharyngeal exudate.  Eyes: Conjunctivae and EOM are normal. Pupils are equal, round, and reactive to light.  Neck: Normal range of motion. Neck supple. No JVD present.  Cardiovascular: Normal rate, regular rhythm, normal heart sounds and intact distal pulses.   Pulmonary/Chest: Effort normal. No respiratory distress. She has no rales.  Diminished breath sounds at both bases;  Wearing O2 at 2L again (had been tapered off)--needs sometimes here she says  Abdominal: Soft. Bowel sounds are normal. She exhibits no distension and no mass. There is no tenderness.  Large ventral hernia present  Musculoskeletal: Normal range of motion. She exhibits no edema and no tenderness.  Neurological: She is alert and oriented to person, place, and time.  Skin: Skin is warm and dry.  Psychiatric: She has a normal mood and affect.    Labs reviewed: Basic Metabolic Panel:  Recent Labs  05/11/14  0520 05/12/14 0530 05/13/14 0437  NA 139 142 140  K 3.8 4.2 3.7  CL 105 107 105  CO2 23 25 22   GLUCOSE 93 97 96  BUN 6 8 8   CREATININE 0.74 0.80 0.75  CALCIUM 9.7 9.5 9.5   Liver Function Tests:  Recent Labs   04/25/14 1405 05/06/14 2345 05/07/14 0510  AST 16 21 18   ALT <5 6 <5  ALKPHOS 72 59 57  BILITOT 0.3 0.2* 0.2*  PROT 7.4 6.8 6.6  ALBUMIN 3.6 2.3* 2.4*  CBC:  Recent Labs  04/25/14 1405  05/06/14 2345 05/07/14 0510 05/09/14 0503  WBC 16.0*  < > 12.9* 11.1* 8.8  NEUTROABS 14.5*  --  9.8* 8.3*  --   HGB 11.8*  < > 10.2* 10.0* 9.4*  HCT 37.3  < > 32.2* 31.8* 30.8*  MCV 91.2  < > 90.4 89.8 91.1  PLT 328  < > 461* 464* 547*  < > = values in this interval not displayed. pcxr 04/25/14: Cardiomegaly with mild vascular congestion. Small left effusion.  Worsening aeration from priors.  CT angio 04/25/14:  Extensive left lower lobe consolidation involving superior segment  and posterior basal segments could represent aspiration pneumonia in  this patient with a gastric pull-through and a dilated fluid-filled  gastric pull up.  Air-fluid level could represent pulmonary abscess versus layering  fluid within air cyst.  Bilateral pleural effusions.  No evidence for pulmonary emboli.   Barium swallow 04/26/14:  1. Satisfactory swallow study in this patient who is status post  subtotal esophagectomy and gastric pull-through.  2. Some coughing was noted with swallowing during this study,  raising the possibility of aspiration, but no definite aspiration  was identified. A followup modified barium swallow would be more  sensitive  05/01/14:  CXR:  1. Improved lung infiltrates.  2. Small left pleural effusion. Mild residual left lung base opacity  most likely atelectasis. No pulmonary edema.  05/06/14:  CXR:  Worsening retrocardiac consolidation of small to moderate left  pleural effusion. Recommend followup chest radiograph after  treatment to verify improvement.  Suspected mild cardiomegaly with central pulmonary vasculature  congestion.  05/07/14:  US thoracentesis:  Successful ultrasound guided diagnostic and therapeutic left  thoracentesis yielding 600 cc's of pleural fluid.  05/07/14:   CXR:  1. Interval reduction in residual small left-sided pleural effusion  post thoracentesis. No pneumothorax.  2. Improved aeration of lungs with persistent bibasilar opacities,  left greater than right, atelectasis versus infiltrate.   05/08/14:  CT chest w/o contrast:  1. Moderate layering pleural effusions greater on the left. Airspace  opacification in the left lower lobe has decreased since the  04/25/2014 CTA, but lower lobe consolidation remains. On the right  there is mainly compressive atelectasis.  2. Otherwise stable chest status post gastric pull-through.    Assessment/Plan 1. Acute respiratory failure -felt to be due to #2, 3 in context of 4 -this has improved, but she is back to requiring some oxygen at times at snf -cont to monitor--if O2 needs continue to increase, will need repeat chest xray  2. Aspiration pneumonia -has dysphagia -completing her 6 more days of augmentin therapy -working with speech therapy here -she is status post subtotal esophagectomy and gastric pull-through due to her cancer  3. Pleural effusion -persists, will need to monitor for recurrence  -check sats with vitals and monitor respiratory status  4. COPD (chronic obstructive pulmonary disease) -cont prednisone long term, albuterol inhaler, symbicort, O2 as  needed to keep sats over 88%  5. Hypertension -bp at goal with low dose amlodipine and lasix diuretic  6. Adenocarcinoma of gastroesophageal junction -follows with Dr. [name] -completed XRT in 2012 and had weekly taxol/carboplatin then as well -s/p subtotal esophagectomy and gastric pull-through 12/20/11 with negative margins -has solid dysphagia  7. Dysphagia -primarily to solids now which was an improvement -has had at least two episodes of aspiration pneumonia now (per records review) -cannot tolerate oral bisphosphonates due to this and h/o radiation esophagitis, has been on raloxifen as alternative with continued bone  loss  8. Pernicious anemia -long standing, cont b12  9. Osteoporosis, post-menopausal -will d/c evista and begin prolia q 6 mos with first injection given here  Functional status:  Currently requiring some ADL assistance with bathing, dressing due to weakness  Family/ staff Communication: seen with unit supervisor  Labs/tests ordered:  Cbc, bmp next week, monitor sats and repeat cxr if diminish

## 2014-05-16 ENCOUNTER — Non-Acute Institutional Stay (SKILLED_NURSING_FACILITY): Payer: Medicare Other | Admitting: Internal Medicine

## 2014-05-16 DIAGNOSIS — IMO0001 Reserved for inherently not codable concepts without codable children: Secondary | ICD-10-CM

## 2014-05-16 DIAGNOSIS — M797 Fibromyalgia: Secondary | ICD-10-CM

## 2014-05-16 NOTE — Progress Notes (Signed)
Patient ID: ASERET HOFFMAN, female   DOB: Jul 07, 1934, 80 y.o.   MRN: [id number]   Facility: [facility]  Chief Complaint  Patient presents with  . Acute Visit    wants something for her fibromylagia   Allergies  Allergen Reactions  . Keflex [Cephalexin] Itching    "severe itching"  . Nulecit [Na Ferric Gluc Cplx In Sucrose] Diarrhea, Nausea Only and Other (See Comments)      drop in BP  . Spiriva Handihaler [Tiotropium Bromide Monohydrate] Other (See Comments)    constipation  . Tramadol Itching    Skin burning and itching  . Adhesive [Tape] Itching  . Clarithromycin Itching  . Darvon Itching  . Epinephrine Itching  . Morphine And Related Itching  . Oxycodone Itching  . Sulfa Antibiotics Itching  . Vicodin [Hydrocodone-Acetaminophen] Itching    Can take with Benadryl   HPI 80 y/o female patient is here for STR and has history of fibromyalgia. She complaints of continuing diffuse aches and would like to know if she can use ice pack as this helps some at home. She does not tolerate neurontin or lyrica. She is on prednisone. She is also on ibuprofen and zanaflex.  ROS Working with therapy team No other concerns this visit No fever or chills No nausea or vomiting Feels tired but no dyspnea or chest pain  Past Medical History  Diagnosis Date  . Pernicious anemia   . Osteoarthritis   . Fibromyalgia   . Skin lesion     chronic calcific cutaneous lesions  . HX of multiple bleeding ulcers 09/01/2011  . Hypertension     takes Atenolol daily  . Emphysema   . Bronchitis     hx  . Adenocarcinoma of gastroesophageal junction   . Dizziness     d/t crystals in ears that "roll around"and can't get them out  . Back pain   . Osteoporosis   . Scoliosis   . Psoriasis   . H/O hiatal hernia   . GERD (gastroesophageal reflux disease)     takes Aciphex daily  . Gastric ulcer   . Constipation     since Chemo and Radiation;finished up in Nov 2012  .  Diverticulosis   . History of colonic polyps   . Shingles     broke out 2wks ago;has been on meds and to finish up tomorrow  . Kidney stone 25+yrs ago  . Pernicious anemia   . Vitamin B deficiency   . Complication of anesthesia     shortness of breath and pain with gas   . COPD (chronic obstructive pulmonary disease)   . Shortness of breath     with exertion   . Blood transfusion    Medication reviewed. See MAR  Physical exam Vitals reviewed  gen- elderly frail female in NAD cvs- normal s1,s2, rrr abdo- bowel sound present, soft, non tender Ext- able to move all 4, multiple point tenderness on exam  Assessment/plan  fibromyalgia Continue her prednsione with ibuprofen for now. Patient does not want lyrica or neurontin (has not worked in past). Will have her on biofreeze gel bid and ice pack as needed when awake. Encouraged to be out of bed. Will check ESR to assess for active myositis for now. Reassess if worsens

## 2014-05-19 DIAGNOSIS — J90 Pleural effusion, not elsewhere classified: Secondary | ICD-10-CM | POA: Insufficient documentation

## 2014-05-21 ENCOUNTER — Encounter: Payer: Self-pay | Admitting: Internal Medicine

## 2014-05-21 ENCOUNTER — Non-Acute Institutional Stay (SKILLED_NURSING_FACILITY): Payer: Medicare Other | Admitting: Internal Medicine

## 2014-05-21 DIAGNOSIS — J189 Pneumonia, unspecified organism: Secondary | ICD-10-CM

## 2014-05-21 DIAGNOSIS — B370 Candidal stomatitis: Secondary | ICD-10-CM

## 2014-05-21 DIAGNOSIS — J90 Pleural effusion, not elsewhere classified: Secondary | ICD-10-CM

## 2014-05-21 NOTE — Progress Notes (Signed)
Patient ID: [name], female   DOB: 1934/06/28, 80 y.o.   MRN: [id number]  Location:  [facility] Provider:  Jonelle Sidle L. Mariea Clonts, D.O., C.M.D.  Code Status:  Full code  Chief Complaint  Patient presents with  . Acute Visit    [name] concerns     HPI:  80 yo white female with adenoca GE junction s/p resection here for short term rehab s/p complicated hospitalization with pneumonia and left pleural effusion that required thoracentesis was seen for acute visit due to concerns about thrush, but, when seen, said that she was more short of breath since she woke up this morning.  Chest xray was obtained.    Review of Systems:  Review of Systems  Constitutional: Positive for malaise/fatigue. Negative for fever and chills.  HENT: Positive for congestion.   Respiratory: Positive for cough and shortness of breath.   Cardiovascular: Positive for chest pain.       Left posterior ribs  Gastrointestinal: Positive for heartburn.       Has ventral hernia  Skin: Negative for rash.  Neurological: Positive for weakness.    Medications: Patient's Medications  New Prescriptions   No medications on file  Previous Medications   ACETAMINOPHEN (TYLENOL) 500 MG TABLET    Take 500 mg by mouth every 6 (six) hours as needed for mild pain or headache.   ACETIC ACID-HYDROCORTISONE (VOSOL-HC) OTIC SOLUTION    Place 1 drop into both ears 3 (three) times daily as needed (pain and itching).    ALBUTEROL (PROVENTIL HFA;VENTOLIN HFA) 108 (90 BASE) MCG/ACT INHALER    Inhale 2 puffs into the lungs every 6 (six) hours as needed for wheezing (wheezing).   AMLODIPINE (NORVASC) 2.5 MG TABLET    Take 1 tablet by mouth daily.   ASPIRIN EC 81 MG TABLET    Take 81 mg by mouth daily.   ATENOLOL (TENORMIN) 50 MG TABLET    Take 50 mg by mouth 2 (two) times daily.   BUDESONIDE-FORMOTEROL (SYMBICORT) 160-4.5 MCG/ACT INHALER    Inhale 2 puffs into the lungs 2 (two) times daily.   CALCIUM-VITAMIN D (OSCAL WITH  D) 500-200 MG-UNIT PER TABLET    Take 1 tablet by mouth daily.   CHOLECALCIFEROL (VITAMIN D3) 50000 UNITS CAPS    Take 1 capsule by mouth every 14 (fourteen) days.    DENOSUMAB (PROLIA) 60 MG/ML SOLN INJECTION    Inject 60 mg into the skin every 6 (six) months. Administer in upper arm, thigh, or abdomen   DIPHENOXYLATE-ATROPINE (LOMOTIL) 2.5-0.025 MG PER TABLET    Take 1 tablet by mouth 4 (four) times daily as needed for diarrhea or loose stools.   FUROSEMIDE (LASIX) 20 MG TABLET    Take 20 mg by mouth daily.    HYDROXYPROPYL METHYLCELLULOSE (ISOPTO TEARS) 2.5 % OPHTHALMIC SOLUTION    Place 1 drop into both eyes 3 (three) times daily as needed for dry eyes.   IBUPROFEN (ADVIL,MOTRIN) 400 MG TABLET    Take 1 tablet (400 mg total) by mouth every 8 (eight) hours as needed (pain).   LORATADINE (CLARITIN) 10 MG TABLET    Take 10 mg by mouth daily.   LORAZEPAM (ATIVAN) 0.5 MG TABLET    Take 1 tablet (0.5 mg total) by mouth every 8 (eight) hours as needed for anxiety.   OMEPRAZOLE (PRILOSEC) 20 MG CAPSULE    Take 20 mg by mouth daily. @@ noon   PREDNISONE (DELTASONE) 1 MG TABLET    Take  2 mg by mouth daily with breakfast. Takes 2 tablets daily   PROMETHAZINE (PHENERGAN) 25 MG TABLET    Take 25 mg by mouth every 6 (six) hours as needed for nausea or vomiting.   PROMETHAZINE (PHENERGAN) 6.25 MG/5ML SYRUP    Place 10 mLs (12.5 mg total) into feeding tube every 6 (six) hours as needed for nausea.   SACCHAROMYCES BOULARDII (FLORASTOR) 250 MG CAPSULE    Take 1 capsule (250 mg total) by mouth 2 (two) times daily.   SODIUM CHLORIDE (OCEAN) 0.65 % SOLN NASAL SPRAY    Place 1 spray into both nostrils as needed for congestion.   ST JOHNS WORT PO    Take 1 capsule by mouth 2 (two) times daily.   TIZANIDINE (ZANAFLEX) 4 MG TABLET    Take 4-8 mg by mouth every 8 (eight) hours as needed for muscle spasms.    TRAMADOL (ULTRAM) 50 MG TABLET    Take 1 tablet (50 mg total) by mouth every 6 (six) hours as needed for moderate  pain.   WHITE PETROLATUM (VASELINE) GEL    Apply 1 application topically as needed (anterior nares to prevent nose bleed).  Modified Medications   No medications on file  Discontinued Medications   ESOMEPRAZOLE (NEXIUM) 20 MG CAPSULE    Take 20 mg by mouth daily at 12 noon.   RALOXIFENE (EVISTA) 60 MG TABLET    Take 60 mg by mouth every morning.     Physical Exam: Filed Vitals:   05/21/14 1130  BP: 164/93  Pulse: 91  Temp: 97.2 F (36.2 C)  TempSrc: Oral  Resp: 20  Weight: 132 lb (59.875 kg)  SpO2: 95%  Physical Exam  Constitutional: She is oriented to person, place, and time. No distress.  Cardiovascular: Normal rate, regular rhythm and normal heart sounds.   Pulmonary/Chest: Effort normal. She has rales.  Diminished breath sounds left base, otherwise clear  Abdominal: Soft. Bowel sounds are normal. She exhibits no distension. There is no tenderness. A hernia is present.  Neurological: She is alert and oriented to person, place, and time.    Labs reviewed: Basic Metabolic Panel:  Recent Labs  05/11/14 0520 05/12/14 0530 05/13/14 0437  NA 139 142 140  K 3.8 4.2 3.7  CL 105 107 105  CO2 23 25 22   GLUCOSE 93 97 96  BUN 6 8 8   CREATININE 0.74 0.80 0.75  CALCIUM 9.7 9.5 9.5    Liver Function Tests:  Recent Labs  04/25/14 1405 05/06/14 2345 05/07/14 0510  AST 16 21 18   ALT <5 6 <5  ALKPHOS 72 59 57  BILITOT 0.3 0.2* 0.2*  PROT 7.4 6.8 6.6  ALBUMIN 3.6 2.3* 2.4*    CBC:  Recent Labs  04/25/14 1405  05/06/14 2345 05/07/14 0510 05/09/14 0503  WBC 16.0*  < > 12.9* 11.1* 8.8  NEUTROABS 14.5*  --  9.8* 8.3*  --   HGB 11.8*  < > 10.2* 10.0* 9.4*  HCT 37.3  < > 32.2* 31.8* 30.8*  MCV 91.2  < > 90.4 89.8 91.1  PLT 328  < > 461* 464* 547*  < > = values in this interval not displayed.  Significant Diagnostic Results: 05/21/14:  CXR PA and lateral:  Mild consolidation left lower lobe with small left pleural effusion compatible with  pneumonia.  Assessment/Plan 1. HCAP (healthcare-associated pneumonia) -begin levaquin 500mg  po daily for 7 days for pneumonia with pleural effusion -vs q shift while awake including pox -if pulse  ox drops below 90 with her 2L O2, would check repeat CXR stat and asked nursing to call provider (may need to send out for thoracentesis and IV abx, also if febrile) -cbc, bmp in am  2. Recurrent left pleural effusion -keep hob elevated to help with sob -see #1 above  3. Oral thrush -nystatin 71ml po qid swish and spit for 10 days   Family/ staff Communication: seen with unit supervisor  Goals of care: she is here for rehab and remains full code, wants to avoid hospitalization if possible  Labs/tests ordered:  Cbc, bmp in am, chest xray done as above

## 2014-05-28 ENCOUNTER — Non-Acute Institutional Stay (SKILLED_NURSING_FACILITY): Payer: Medicare Other | Admitting: Internal Medicine

## 2014-05-28 ENCOUNTER — Encounter: Payer: Self-pay | Admitting: Internal Medicine

## 2014-05-28 DIAGNOSIS — J440 Chronic obstructive pulmonary disease with acute lower respiratory infection: Secondary | ICD-10-CM

## 2014-05-28 DIAGNOSIS — J189 Pneumonia, unspecified organism: Secondary | ICD-10-CM

## 2014-05-28 DIAGNOSIS — J90 Pleural effusion, not elsewhere classified: Secondary | ICD-10-CM

## 2014-05-28 DIAGNOSIS — J4489 Other specified chronic obstructive pulmonary disease: Secondary | ICD-10-CM

## 2014-05-28 DIAGNOSIS — J988 Other specified respiratory disorders: Secondary | ICD-10-CM

## 2014-05-28 DIAGNOSIS — J449 Chronic obstructive pulmonary disease, unspecified: Secondary | ICD-10-CM

## 2014-05-28 NOTE — Progress Notes (Signed)
Patient ID: [name], female   DOB: 24-Jun-1934, 80 y.o.   MRN: [id number]  Location:  [facility] Provider:  Jonelle Sidle L. Mariea Clonts, D.O., C.M.D.  Code Status:  Full code  Chief Complaint  Patient presents with  . Acute Visit    ? discharge home--has recurrent pneumonia and on abx, still doing therapy, quite dyspneic on exertion--?safe to go home at this point    HPI:  80 yo white female here for short term rehab after hospitalization for pneumonia with effusion.  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  She has been doing very well with her therapy--is able to ambulate and transfer and therapy feels she is safe to go home with additional home health therapy.  She has continued to have a need for oxygen and was treated again for pneumonia with levaquin which she completed this morning.  She is eager to go home with her son who is willing to look after her.  She is pretty adamant about leaving.  We discussed that she is likely to have recurrence of the fluid (thought her xray did not show any increase in the effusion).  We briefly discussed the concept of not returning to the hospital (she seems to understand that she is declining), but she does not feel ready to decline hospitalizations just yet (though she does not want to go).  Review of Systems:  Review of Systems  Constitutional: Positive for malaise/fatigue. Negative for fever.  HENT: Negative for congestion.   Eyes: Negative for blurred vision.  Respiratory: Positive for shortness of breath. Negative for cough and sputum production.        Does not want to go home with O2, but is requiring it  Cardiovascular: Negative for chest pain and leg swelling.  Gastrointestinal: Negative for abdominal pain, constipation, blood in stool and melena.  Genitourinary: Negative for dysuria.  Musculoskeletal: Negative for falls and myalgias.  Skin: Negative for rash.  Neurological: Positive for weakness. Negative for dizziness and  loss of consciousness.  Endo/Heme/Allergies: Bruises/bleeds easily.  Psychiatric/Behavioral: Positive for depression. Negative for memory loss.    Medications: Patient's Medications  New Prescriptions   No medications on file  Previous Medications   ACETAMINOPHEN (TYLENOL) 500 MG TABLET    Take 500 mg by mouth every 6 (six) hours as needed for mild pain or headache.   ACETIC ACID-HYDROCORTISONE (VOSOL-HC) OTIC SOLUTION    Place 1 drop into both ears 3 (three) times daily as needed (pain and itching).    ALBUTEROL (PROVENTIL HFA;VENTOLIN HFA) 108 (90 BASE) MCG/ACT INHALER    Inhale 2 puffs into the lungs every 6 (six) hours as needed for wheezing (wheezing).   AMLODIPINE (NORVASC) 2.5 MG TABLET    Take 1 tablet by mouth daily.   ASPIRIN EC 81 MG TABLET    Take 81 mg by mouth daily.   ATENOLOL (TENORMIN) 50 MG TABLET    Take 50 mg by mouth 2 (two) times daily.   BUDESONIDE-FORMOTEROL (SYMBICORT) 160-4.5 MCG/ACT INHALER    Inhale 2 puffs into the lungs 2 (two) times daily.   CALCIUM-VITAMIN D (OSCAL WITH D) 500-200 MG-UNIT PER TABLET    Take 1 tablet by mouth daily.   CHOLECALCIFEROL (VITAMIN D3) 50000 UNITS CAPS    Take 1 capsule by mouth every 14 (fourteen) days.    DENOSUMAB (PROLIA) 60 MG/ML SOLN INJECTION    Inject 60 mg into the skin every 6 (six) months. Administer in upper arm, thigh, or  abdomen   DIPHENOXYLATE-ATROPINE (LOMOTIL) 2.5-0.025 MG PER TABLET    Take 1 tablet by mouth 4 (four) times daily as needed for diarrhea or loose stools.   FUROSEMIDE (LASIX) 20 MG TABLET    Take 20 mg by mouth daily.    HYDROXYPROPYL METHYLCELLULOSE (ISOPTO TEARS) 2.5 % OPHTHALMIC SOLUTION    Place 1 drop into both eyes 3 (three) times daily as needed for dry eyes.   IBUPROFEN (ADVIL,MOTRIN) 400 MG TABLET    Take 1 tablet (400 mg total) by mouth every 8 (eight) hours as needed (pain).   LORATADINE (CLARITIN) 10 MG TABLET    Take 10 mg by mouth daily.   LORAZEPAM (ATIVAN) 0.5 MG TABLET    Take 1 tablet  (0.5 mg total) by mouth every 8 (eight) hours as needed for anxiety.   OMEPRAZOLE (PRILOSEC) 20 MG CAPSULE    Take 20 mg by mouth daily. @@ noon   PREDNISONE (DELTASONE) 1 MG TABLET    Take 2 mg by mouth daily with breakfast. Takes 2 tablets daily   PROMETHAZINE (PHENERGAN) 25 MG TABLET    Take 25 mg by mouth every 6 (six) hours as needed for nausea or vomiting.   PROMETHAZINE (PHENERGAN) 6.25 MG/5ML SYRUP    Place 10 mLs (12.5 mg total) into feeding tube every 6 (six) hours as needed for nausea.   SACCHAROMYCES BOULARDII (FLORASTOR) 250 MG CAPSULE    Take 1 capsule (250 mg total) by mouth 2 (two) times daily.   SODIUM CHLORIDE (OCEAN) 0.65 % SOLN NASAL SPRAY    Place 1 spray into both nostrils as needed for congestion.   ST JOHNS WORT PO    Take 1 capsule by mouth 2 (two) times daily.   TIZANIDINE (ZANAFLEX) 4 MG TABLET    Take 4-8 mg by mouth every 8 (eight) hours as needed for muscle spasms.    TRAMADOL (ULTRAM) 50 MG TABLET    Take 1 tablet (50 mg total) by mouth every 6 (six) hours as needed for moderate pain.   WHITE PETROLATUM (VASELINE) GEL    Apply 1 application topically as needed (anterior nares to prevent nose bleed).  Modified Medications   No medications on file  Discontinued Medications   No medications on file    Physical Exam: Filed Vitals:   05/28/14 1058  BP: 132/70  Pulse: 79  Temp: 98.2 F (36.8 C)  Resp: 20  Height: 5\' 4"  (1.626 m)  Weight: 133 lb (60.328 kg)  SpO2: 96%  Physical Exam  Constitutional: She is oriented to person, place, and time. No distress.  HENT:  Head: Normocephalic and atraumatic.  Cardiovascular: Normal rate, regular rhythm, normal heart sounds and intact distal pulses.   Pulmonary/Chest: Breath sounds normal. No respiratory distress.  No noticeable diminished breath sounds at bases like I heard last week  Abdominal: Soft. Bowel sounds are normal. She exhibits no distension. There is no tenderness. A hernia is present.  Musculoskeletal:  Normal range of motion.  Neurological: She is alert and oriented to person, place, and time.  Skin: Skin is warm and dry.  Psychiatric: She has a normal mood and affect.    Labs reviewed: Basic Metabolic Panel:  Recent Labs  05/11/14 0520 05/12/14 0530 05/13/14 0437  NA 139 142 140  K 3.8 4.2 3.7  CL 105 107 105  CO2 23 25 22   GLUCOSE 93 97 96  BUN 6 8 8   CREATININE 0.74 0.80 0.75  CALCIUM 9.7 9.5 9.5  Liver Function Tests:  Recent Labs  04/25/14 1405 05/06/14 2345 05/07/14 0510  AST 16 21 18   ALT <5 6 <5  ALKPHOS 72 59 57  BILITOT 0.3 0.2* 0.2*  PROT 7.4 6.8 6.6  ALBUMIN 3.6 2.3* 2.4*    CBC:  Recent Labs  04/25/14 1405  05/06/14 2345 05/07/14 0510 05/09/14 0503  WBC 16.0*  < > 12.9* 11.1* 8.8  NEUTROABS 14.5*  --  9.8* 8.3*  --   HGB 11.8*  < > 10.2* 10.0* 9.4*  HCT 37.3  < > 32.2* 31.8* 30.8*  MCV 91.2  < > 90.4 89.8 91.1  PLT 328  < > 461* 464* 547*  < > = values in this interval not displayed.  Assessment/Plan 1. HCAP (healthcare-associated pneumonia) -completed levaquin therapy today -insurance will no longer pay for her rehab and she is eager to go home -lung exam improved from last week   2. Pleural effusion -better by exam -still has O2 requirement and will need it at home  3. COPD with exacerbation -has improved, does continue to require oxygen therapy likely due to the effusion -reinforced that she may need to have recurrent thoracentesis  Family/ staff Communication: seen with unit supervisor, discussed with DNS, Social work  Goals of care:  Will return home with home health PT, OT, RN, CNA, her son and home oxygen to keep sats over 88%, f/u with PCP, Dr. Maceo Pro in 1-2 wks

## 2014-07-13 ENCOUNTER — Encounter (HOSPITAL_COMMUNITY): Payer: Self-pay | Admitting: Emergency Medicine

## 2014-07-13 ENCOUNTER — Inpatient Hospital Stay (HOSPITAL_COMMUNITY)
Admission: EM | Admit: 2014-07-13 | Discharge: 2014-07-14 | DRG: 189 | Disposition: A | Discharge status: Home / Self Care | Payer: Medicare Other | Attending: Family Medicine | Admitting: Family Medicine

## 2014-07-13 ENCOUNTER — Inpatient Hospital Stay (HOSPITAL_COMMUNITY): Payer: Medicare Other

## 2014-07-13 ENCOUNTER — Emergency Department (HOSPITAL_COMMUNITY): Payer: Medicare Other

## 2014-07-13 DIAGNOSIS — J9601 Acute respiratory failure with hypoxia: Secondary | ICD-10-CM

## 2014-07-13 DIAGNOSIS — J441 Chronic obstructive pulmonary disease with (acute) exacerbation: Secondary | ICD-10-CM

## 2014-07-13 DIAGNOSIS — E539 Vitamin B deficiency, unspecified: Secondary | ICD-10-CM | POA: Diagnosis present

## 2014-07-13 DIAGNOSIS — Z803 Family history of malignant neoplasm of breast: Secondary | ICD-10-CM

## 2014-07-13 DIAGNOSIS — D510 Vitamin B12 deficiency anemia due to intrinsic factor deficiency: Secondary | ICD-10-CM | POA: Diagnosis present

## 2014-07-13 DIAGNOSIS — Z96649 Presence of unspecified artificial hip joint: Secondary | ICD-10-CM

## 2014-07-13 DIAGNOSIS — M810 Age-related osteoporosis without current pathological fracture: Secondary | ICD-10-CM | POA: Diagnosis present

## 2014-07-13 DIAGNOSIS — M797 Fibromyalgia: Secondary | ICD-10-CM

## 2014-07-13 DIAGNOSIS — M1990 Unspecified osteoarthritis, unspecified site: Secondary | ICD-10-CM | POA: Diagnosis present

## 2014-07-13 DIAGNOSIS — Z87891 Personal history of nicotine dependence: Secondary | ICD-10-CM

## 2014-07-13 DIAGNOSIS — J9600 Acute respiratory failure, unspecified whether with hypoxia or hypercapnia: Secondary | ICD-10-CM | POA: Diagnosis present

## 2014-07-13 DIAGNOSIS — J449 Chronic obstructive pulmonary disease, unspecified: Secondary | ICD-10-CM | POA: Diagnosis present

## 2014-07-13 DIAGNOSIS — M4120 Other idiopathic scoliosis, site unspecified: Secondary | ICD-10-CM | POA: Diagnosis present

## 2014-07-13 DIAGNOSIS — J189 Pneumonia, unspecified organism: Secondary | ICD-10-CM | POA: Diagnosis present

## 2014-07-13 DIAGNOSIS — Z85028 Personal history of other malignant neoplasm of stomach: Secondary | ICD-10-CM | POA: Diagnosis not present

## 2014-07-13 DIAGNOSIS — R0603 Acute respiratory distress: Secondary | ICD-10-CM

## 2014-07-13 DIAGNOSIS — I5042 Chronic combined systolic (congestive) and diastolic (congestive) heart failure: Secondary | ICD-10-CM | POA: Diagnosis present

## 2014-07-13 DIAGNOSIS — R0602 Shortness of breath: Secondary | ICD-10-CM | POA: Diagnosis present

## 2014-07-13 DIAGNOSIS — I509 Heart failure, unspecified: Secondary | ICD-10-CM | POA: Diagnosis present

## 2014-07-13 DIAGNOSIS — I5033 Acute on chronic diastolic (congestive) heart failure: Secondary | ICD-10-CM | POA: Diagnosis present

## 2014-07-13 DIAGNOSIS — Z8701 Personal history of pneumonia (recurrent): Secondary | ICD-10-CM

## 2014-07-13 DIAGNOSIS — K284 Chronic or unspecified gastrojejunal ulcer with hemorrhage: Secondary | ICD-10-CM

## 2014-07-13 DIAGNOSIS — L408 Other psoriasis: Secondary | ICD-10-CM | POA: Diagnosis present

## 2014-07-13 DIAGNOSIS — IMO0001 Reserved for inherently not codable concepts without codable children: Secondary | ICD-10-CM | POA: Diagnosis present

## 2014-07-13 DIAGNOSIS — R0609 Other forms of dyspnea: Secondary | ICD-10-CM | POA: Diagnosis not present

## 2014-07-13 DIAGNOSIS — K219 Gastro-esophageal reflux disease without esophagitis: Secondary | ICD-10-CM | POA: Diagnosis present

## 2014-07-13 DIAGNOSIS — K274 Chronic or unspecified peptic ulcer, site unspecified, with hemorrhage: Secondary | ICD-10-CM

## 2014-07-13 DIAGNOSIS — I10 Essential (primary) hypertension: Secondary | ICD-10-CM | POA: Diagnosis present

## 2014-07-13 DIAGNOSIS — C160 Malignant neoplasm of cardia: Secondary | ICD-10-CM

## 2014-07-13 DIAGNOSIS — J90 Pleural effusion, not elsewhere classified: Secondary | ICD-10-CM

## 2014-07-13 LAB — COMPREHENSIVE METABOLIC PANEL
ALBUMIN: 3.4 g/dL — AB (ref 3.5–5.2)
ALT: <5 U/L (ref 0–35)
ANION GAP: 13 (ref 5–15)
AST: 12 U/L (ref 0–37)
Alkaline Phosphatase: 62 U/L (ref 39–117)
BUN: 16 mg/dL (ref 6–23)
CO2: 23 mEq/L (ref 19–32)
Calcium: 9.7 mg/dL (ref 8.4–10.5)
Chloride: 105 mEq/L (ref 96–112)
Creatinine, Ser: 0.85 mg/dL (ref 0.50–1.10)
GFR calc Af Amer: 74 mL/min — ABNORMAL LOW (ref >90)
GFR calc non Af Amer: 64 mL/min — ABNORMAL LOW (ref >90)
Glucose, Bld: 104 mg/dL — ABNORMAL HIGH (ref 70–99)
Potassium: 4.2 mEq/L (ref 3.7–5.3)
Sodium: 141 mEq/L (ref 137–147)
TOTAL PROTEIN: 7.5 g/dL (ref 6.0–8.3)
Total Bilirubin: 0.2 mg/dL — ABNORMAL LOW (ref 0.3–1.2)

## 2014-07-13 LAB — EXPECTORATED SPUTUM ASSESSMENT W REFEX TO RESP CULTURE

## 2014-07-13 LAB — CBC WITH DIFFERENTIAL/PLATELET
BASOS PCT: 1 % (ref 0–1)
Basophils Absolute: 0 10*3/uL (ref 0.0–0.1)
EOS ABS: 0.2 10*3/uL (ref 0.0–0.7)
EOS PCT: 3 % (ref 0–5)
HCT: 35.7 % — ABNORMAL LOW (ref 36.0–46.0)
HEMOGLOBIN: 10.6 g/dL — AB (ref 12.0–15.0)
LYMPHS ABS: 1.1 10*3/uL (ref 0.7–4.0)
Lymphocytes Relative: 13 % (ref 12–46)
MCH: 28.3 pg (ref 26.0–34.0)
MCHC: 29.7 g/dL — AB (ref 30.0–36.0)
MCV: 95.2 fL (ref 78.0–100.0)
MONOS PCT: 11 % (ref 3–12)
Monocytes Absolute: 0.9 10*3/uL (ref 0.1–1.0)
NEUTROS PCT: 72 % (ref 43–77)
Neutro Abs: 6.3 10*3/uL (ref 1.7–7.7)
PLATELETS: 339 10*3/uL (ref 150–400)
RBC: 3.75 MIL/uL — AB (ref 3.87–5.11)
RDW: 17.6 % — ABNORMAL HIGH (ref 11.5–15.5)
WBC: 8.7 10*3/uL (ref 4.0–10.5)

## 2014-07-13 LAB — INFLUENZA PANEL BY PCR (TYPE A & B)
H1N1FLUPCR: NOT DETECTED
INFLBPCR: NEGATIVE
Influenza A By PCR: NEGATIVE

## 2014-07-13 LAB — EXPECTORATED SPUTUM ASSESSMENT W GRAM STAIN, RFLX TO RESP C

## 2014-07-13 LAB — PRO B NATRIURETIC PEPTIDE: Pro B Natriuretic peptide (BNP): 3079 pg/mL — ABNORMAL HIGH (ref 0–450)

## 2014-07-13 LAB — LACTIC ACID, PLASMA: Lactic Acid, Venous: 1.5 mmol/L (ref 0.5–2.2)

## 2014-07-13 LAB — GLUCOSE, CAPILLARY: Glucose-Capillary: 103 mg/dL — ABNORMAL HIGH (ref 70–99)

## 2014-07-13 LAB — STREP PNEUMONIAE URINARY ANTIGEN: STREP PNEUMO URINARY ANTIGEN: NEGATIVE

## 2014-07-13 MED ORDER — FUROSEMIDE 10 MG/ML IJ SOLN
40.0000 mg | Freq: Once | INTRAMUSCULAR | Status: AC
  Administered 2014-07-13: 40 mg via INTRAVENOUS
  Filled 2014-07-13: qty 4

## 2014-07-13 MED ORDER — GUAIFENESIN-DM 100-10 MG/5ML PO SYRP
5.0000 mL | ORAL_SOLUTION | ORAL | Status: DC | PRN
  Administered 2014-07-13 (×2): 5 mL via ORAL
  Filled 2014-07-13 (×2): qty 10

## 2014-07-13 MED ORDER — VANCOMYCIN HCL IN DEXTROSE 1-5 GM/200ML-% IV SOLN
1000.0000 mg | Freq: Once | INTRAVENOUS | Status: AC
  Administered 2014-07-13: 1000 mg via INTRAVENOUS
  Filled 2014-07-13: qty 200

## 2014-07-13 MED ORDER — ALBUTEROL SULFATE (2.5 MG/3ML) 0.083% IN NEBU
5.0000 mg | INHALATION_SOLUTION | Freq: Once | RESPIRATORY_TRACT | Status: AC
Start: 2014-07-13 — End: 2014-07-13
  Administered 2014-07-13: 5 mg via RESPIRATORY_TRACT
  Filled 2014-07-13: qty 6

## 2014-07-13 MED ORDER — ALBUTEROL SULFATE (2.5 MG/3ML) 0.083% IN NEBU: 5.0000 mg | INHALATION_SOLUTION | RESPIRATORY_TRACT | Status: DC | PRN

## 2014-07-13 MED ORDER — METHYLPREDNISOLONE SODIUM SUCC 40 MG IJ SOLR
40.0000 mg | Freq: Every day | INTRAMUSCULAR | Status: DC
  Administered 2014-07-14: 40 mg via INTRAVENOUS
  Filled 2014-07-13: qty 1

## 2014-07-13 MED ORDER — WHITE PETROLATUM GEL
1.0000 "application " | Status: DC | PRN
  Filled 2014-07-13: qty 5

## 2014-07-13 MED ORDER — ONDANSETRON HCL 4 MG/2ML IJ SOLN: 4.0000 mg | Freq: Three times a day (TID) | INTRAMUSCULAR | Status: DC | PRN

## 2014-07-13 MED ORDER — TIOTROPIUM BROMIDE MONOHYDRATE 18 MCG IN CAPS
18.0000 ug | ORAL_CAPSULE | Freq: Every day | RESPIRATORY_TRACT | Status: DC
  Administered 2014-07-13: 18 ug via RESPIRATORY_TRACT
  Filled 2014-07-13: qty 5

## 2014-07-13 MED ORDER — LORATADINE 10 MG PO TABS
10.0000 mg | ORAL_TABLET | Freq: Every day | ORAL | Status: DC
  Administered 2014-07-13 – 2014-07-14 (×2): 10 mg via ORAL
  Filled 2014-07-13 (×2): qty 1

## 2014-07-13 MED ORDER — LEVOFLOXACIN IN D5W 500 MG/100ML IV SOLN
500.0000 mg | Freq: Once | INTRAVENOUS | Status: AC
  Administered 2014-07-13: 500 mg via INTRAVENOUS
  Filled 2014-07-13: qty 100

## 2014-07-13 MED ORDER — PANTOPRAZOLE SODIUM 40 MG PO TBEC
40.0000 mg | DELAYED_RELEASE_TABLET | Freq: Every day | ORAL | Status: DC
  Administered 2014-07-13 – 2014-07-14 (×2): 40 mg via ORAL
  Filled 2014-07-13 (×2): qty 1

## 2014-07-13 MED ORDER — PIPERACILLIN-TAZOBACTAM 3.375 G IVPB
3.3750 g | Freq: Three times a day (TID) | INTRAVENOUS | Status: DC
  Administered 2014-07-13 – 2014-07-14 (×3): 3.375 g via INTRAVENOUS
  Filled 2014-07-13 (×4): qty 50

## 2014-07-13 MED ORDER — VANCOMYCIN HCL 500 MG IV SOLR
500.0000 mg | Freq: Two times a day (BID) | INTRAVENOUS | Status: DC
  Administered 2014-07-13 – 2014-07-14 (×2): 500 mg via INTRAVENOUS
  Filled 2014-07-13 (×2): qty 500

## 2014-07-13 MED ORDER — IPRATROPIUM BROMIDE 0.02 % IN SOLN: 0.5000 mg | RESPIRATORY_TRACT | Status: DC | PRN

## 2014-07-13 MED ORDER — PROMETHAZINE HCL 25 MG PO TABS: 25.0000 mg | ORAL_TABLET | Freq: Four times a day (QID) | ORAL | Status: DC | PRN

## 2014-07-13 MED ORDER — ASPIRIN EC 81 MG PO TBEC
81.0000 mg | DELAYED_RELEASE_TABLET | Freq: Every day | ORAL | Status: DC
  Administered 2014-07-13 – 2014-07-14 (×2): 81 mg via ORAL
  Filled 2014-07-13 (×2): qty 1

## 2014-07-13 MED ORDER — SODIUM CHLORIDE 0.9 % IJ SOLN
3.0000 mL | Freq: Two times a day (BID) | INTRAMUSCULAR | Status: DC
  Administered 2014-07-13 – 2014-07-14 (×2): 3 mL via INTRAVENOUS

## 2014-07-13 MED ORDER — LORAZEPAM 0.5 MG PO TABS
0.5000 mg | ORAL_TABLET | Freq: Three times a day (TID) | ORAL | Status: DC | PRN
  Administered 2014-07-13 (×2): 0.5 mg via ORAL
  Filled 2014-07-13 (×2): qty 1

## 2014-07-13 MED ORDER — ALBUTEROL (5 MG/ML) CONTINUOUS INHALATION SOLN
10.0000 mg | INHALATION_SOLUTION | RESPIRATORY_TRACT | Status: DC
  Filled 2014-07-13: qty 20

## 2014-07-13 MED ORDER — ONDANSETRON HCL 4 MG PO TABS: 4.0000 mg | ORAL_TABLET | Freq: Four times a day (QID) | ORAL | Status: DC | PRN

## 2014-07-13 MED ORDER — SACCHAROMYCES BOULARDII 250 MG PO CAPS
250.0000 mg | ORAL_CAPSULE | Freq: Two times a day (BID) | ORAL | Status: DC
  Administered 2014-07-13 – 2014-07-14 (×3): 250 mg via ORAL
  Filled 2014-07-13 (×4): qty 1

## 2014-07-13 MED ORDER — FENTANYL CITRATE 0.05 MG/ML IJ SOLN
12.5000 ug | INTRAMUSCULAR | Status: DC | PRN
  Administered 2014-07-13 – 2014-07-14 (×3): 12.5 ug via INTRAVENOUS
  Filled 2014-07-13 (×3): qty 2

## 2014-07-13 MED ORDER — HYPROMELLOSE (GONIOSCOPIC) 2.5 % OP SOLN: 1.0000 [drp] | Freq: Three times a day (TID) | OPHTHALMIC | Status: DC | PRN

## 2014-07-13 MED ORDER — TIZANIDINE HCL 4 MG PO TABS
4.0000 mg | ORAL_TABLET | Freq: Three times a day (TID) | ORAL | Status: DC | PRN
  Administered 2014-07-13: 4 mg via ORAL
  Filled 2014-07-13: qty 2

## 2014-07-13 MED ORDER — IBUPROFEN 200 MG PO TABS
400.0000 mg | ORAL_TABLET | Freq: Three times a day (TID) | ORAL | Status: DC | PRN
  Administered 2014-07-13: 400 mg via ORAL
  Filled 2014-07-13: qty 2

## 2014-07-13 MED ORDER — CALCIUM CARBONATE-VITAMIN D 500-200 MG-UNIT PO TABS
1.0000 | ORAL_TABLET | Freq: Every day | ORAL | Status: DC
  Administered 2014-07-13 – 2014-07-14 (×2): 1 via ORAL
  Filled 2014-07-13 (×2): qty 1

## 2014-07-13 MED ORDER — SALINE SPRAY 0.65 % NA SOLN
1.0000 | NASAL | Status: DC | PRN
  Filled 2014-07-13: qty 44

## 2014-07-13 MED ORDER — METHYLPREDNISOLONE SODIUM SUCC 125 MG IJ SOLR
80.0000 mg | Freq: Once | INTRAMUSCULAR | Status: AC
  Administered 2014-07-13: 80 mg via INTRAVENOUS
  Filled 2014-07-13: qty 2

## 2014-07-13 MED ORDER — POLYVINYL ALCOHOL 1.4 % OP SOLN
1.0000 [drp] | Freq: Three times a day (TID) | OPHTHALMIC | Status: DC | PRN
  Filled 2014-07-13: qty 15

## 2014-07-13 MED ORDER — SODIUM CHLORIDE 0.9 % IV SOLN
INTRAVENOUS | Status: DC
  Administered 2014-07-13: 11:00:00 via INTRAVENOUS

## 2014-07-13 MED ORDER — ATENOLOL 50 MG PO TABS
50.0000 mg | ORAL_TABLET | Freq: Two times a day (BID) | ORAL | Status: DC
  Administered 2014-07-13 – 2014-07-14 (×3): 50 mg via ORAL
  Filled 2014-07-13 (×4): qty 1

## 2014-07-13 MED ORDER — ONDANSETRON HCL 4 MG/2ML IJ SOLN
4.0000 mg | Freq: Four times a day (QID) | INTRAMUSCULAR | Status: DC | PRN
  Administered 2014-07-14: 4 mg via INTRAVENOUS
  Filled 2014-07-13: qty 2

## 2014-07-13 MED ORDER — AMLODIPINE BESYLATE 2.5 MG PO TABS
2.5000 mg | ORAL_TABLET | Freq: Every day | ORAL | Status: DC
  Administered 2014-07-13 – 2014-07-14 (×2): 2.5 mg via ORAL
  Filled 2014-07-13 (×2): qty 1

## 2014-07-13 MED ORDER — LEVOFLOXACIN IN D5W 750 MG/150ML IV SOLN
750.0000 mg | INTRAVENOUS | Status: DC
  Filled 2014-07-13: qty 150

## 2014-07-13 MED ORDER — VITAMIN D (ERGOCALCIFEROL) 1.25 MG (50000 UNIT) PO CAPS: 50000.0000 [IU] | ORAL_CAPSULE | ORAL | Status: DC

## 2014-07-13 MED ORDER — ACETAMINOPHEN 500 MG PO TABS
500.0000 mg | ORAL_TABLET | Freq: Four times a day (QID) | ORAL | Status: DC | PRN
  Administered 2014-07-13 – 2014-07-14 (×3): 500 mg via ORAL
  Filled 2014-07-13 (×3): qty 1

## 2014-07-13 MED ORDER — FUROSEMIDE 20 MG PO TABS
20.0000 mg | ORAL_TABLET | Freq: Every day | ORAL | Status: DC
  Filled 2014-07-13: qty 1

## 2014-07-13 MED ORDER — BUDESONIDE-FORMOTEROL FUMARATE 160-4.5 MCG/ACT IN AERO
2.0000 | INHALATION_SPRAY | Freq: Two times a day (BID) | RESPIRATORY_TRACT | Status: DC
  Administered 2014-07-13 (×2): 2 via RESPIRATORY_TRACT
  Filled 2014-07-13: qty 6

## 2014-07-13 NOTE — H&P (Signed)
[facility] History and Physical  [name] YKD:[id number] DOB: June 21, 1934 DOA: 07/13/2014  Referring physician: ER physician PCP: [name], MD   Chief Complaint: shortness of breath   HPI:  80 year old femal ewith past medical history of adenocarcinoma of GE junction (Follows with Dr. [name]), COPD, previous admission for pneumonia who presented to [facility] ED 07/13/2014 with worsening shortness of breath and rib cage pain for past few days prior to this admission. Pt also reported productive cough. Patient reported pain is worse with breathing but is present at rest as well. No associated fevers or chills. No abdominal pain, nausea or vomiting. No reports of blood in stool or urine.  In ED, BP was 150/82, HR 73, RR 16-21, T max 97.8 F and oxygen saturation 94% on 2 L [place] oxygen support. Blood work showed hemoglobin of 10.6 otherwise unremarkable. CXR showed left side pleural effusion, COPD,atelectasis. She was started on IV Levaquin for treatment of possible pneumonia. Pt was given nebulizer treatments in ED and solu-medrol 80 mg IV but continued to be short of breath.   Assessment & Plan    Principal Problem:   Acute respiratory failure with hypoxia / Acute COPD exacerbation / Left pleural effusion / HCAP  Likely COPD exacerbation versus acute decompensated diastolic CHF, HCAP  Pt reports being allergic to multiple inhalers and nebulizer treatments. She does however take spiriva at home as well as symbicort and albuterol inhaler. I have restarted spiriva and symbicort. Order placed for nebulizer treatment Atrovent and albuterol. Order placed for solu-medrol 40 mg IV daily.  Order placed for US thoracentesis. Left pleural effusion seen on CXR and may be enough amount for thoracentesis.   In regards to pneumonia, order placed for vanco and cefepime due to concern for HCAP considering recent admissions for pneumonia. Levaquin stopped. I appreciate pharmacy assistance with abx  management.  Oxygen support via [place] to keep O2 saturation above 90% Active Problems:   HCAP  Pneumonia order set is in place.  Start vanco and cefepime  Follow up blood culture results, strep penumo, legionella, influenza results   Diastolic CHF, acute on chronic  BNP on this admission 3079 (better since 2 months ago 5856)  Will give dose of lasix 40 mg IV once.  Daily weight  Strict intake and output  Replace electrolytes as needed   Pernicious anemia  Hemoglobin is stable. No current indications for transfusion    Hypertension  Restart home meds: norvasc, atenolol    DVT prophylaxis  SCD's bilaterally due to risk of bleeding (has h/o multiple bleeding ulcers)   GI prophylaxis  Continue PPI therapy   Radiological Exams on Admission: Dg Chest 2 View  07/13/2014  IMPRESSION: Findings of COPD. Residual small left pleural effusion, with associated atelectasis.      Code Status: Full Family Communication: Plan of care discussed with the patient  Disposition Plan: Admit for further evaluation; PT eval once pt able to participate   Leisa Lenz, MD  [facility] Pager [phone]  Review of Systems:  Constitutional: Negative for fever, chills and malaise/fatigue. Negative for diaphoresis.  HENT: Negative for hearing loss, ear pain, nosebleeds, congestion, sore throat, neck pain, tinnitus and ear discharge.   Eyes: Negative for blurred vision, double vision, photophobia, pain, discharge and redness.  Respiratory:per HPI.   Cardiovascular: Negative for chest pain, palpitations, orthopnea, claudication and leg swelling.  Gastrointestinal: Negative for nausea, vomiting and abdominal pain. Negative for heartburn, constipation, blood in stool and melena.  Genitourinary: Negative for dysuria, urgency, frequency, hematuria and flank pain.  Musculoskeletal: Negative for myalgias, back pain, joint pain and falls.  Skin: Negative for itching and rash.  Neurological: Negative  for dizziness and weakness. Negative for tingling, tremors, sensory change, speech change, focal weakness, loss of consciousness and headaches.  Endo/Heme/Allergies: Negative for environmental allergies and polydipsia. Does not bruise/bleed easily.  Psychiatric/Behavioral: Negative for suicidal ideas. The patient is not nervous/anxious.      Past Medical History  Diagnosis Date  . Pernicious anemia   . Osteoarthritis   . Fibromyalgia   . Skin lesion     chronic calcific cutaneous lesions  . HX of multiple bleeding ulcers 09/01/2011  . Hypertension     takes Atenolol daily  . Emphysema   . Bronchitis     hx  . Adenocarcinoma of gastroesophageal junction   . Dizziness     d/t crystals in ears that "roll around"and can't get them out  . Back pain   . Osteoporosis   . Scoliosis   . Psoriasis   . H/O hiatal hernia   . GERD (gastroesophageal reflux disease)     takes Aciphex daily  . Gastric ulcer   . Constipation     since Chemo and Radiation;finished up in Nov 2012  . Diverticulosis   . History of colonic polyps   . Shingles     broke out 2wks ago;has been on meds and to finish up tomorrow  . Kidney stone 25+yrs ago  . Pernicious anemia   . Vitamin B deficiency   . Complication of anesthesia     shortness of breath and pain with gas   . COPD (chronic obstructive pulmonary disease)   . Shortness of breath     with exertion   . Blood transfusion    Past Surgical History  Procedure Laterality Date  . Right hip replacement  2001  . Tonsillectomy      as a child  . Tubal ligation  1975  . Appendectomy  1975  . Cataract surgery  10+yrs ago  . Esophagogastroduodenoscopy    . Colonoscopy    . Extubation  12/20/2011       . Partial esophagectomy  12/20/2011    Procedure: ESOPHAGECTOMY PARTIAL;  Surgeon: Pierre Bali, MD;  Location: [facility];  Service: Thoracic;  Laterality: N/A;  . Total hip arthroplasty  06/30/2012    Procedure: TOTAL HIP ARTHROPLASTY ANTERIOR APPROACH;   Surgeon: Mcarthur Rossetti, MD;  Location: [facility];  Service: Orthopedics;  Laterality: Left;  Left total hip arthroplasty   Social History:  reports that she quit smoking about 22 years ago. Her smoking use included Cigarettes. She smoked 3.00 packs per day. She has never used smokeless tobacco. She reports that she does not drink alcohol or use illicit drugs.  Allergies  Allergen Reactions  . Keflex [Cephalexin] Itching    "severe itching"  . Nulecit [Na Ferric Gluc Cplx In Sucrose] Diarrhea, Nausea Only and Other (See Comments)      drop in BP  . Spiriva Handihaler [Tiotropium Bromide Monohydrate] Other (See Comments)    constipation  . Tramadol Itching    Skin burning and itching  . Adhesive [Tape] Itching  . Clarithromycin Itching  . Darvon Itching  . Epinephrine Itching  . Morphine And Related Itching  . Oxycodone Itching  . Sulfa Antibiotics Itching  . Vicodin [Hydrocodone-Acetaminophen] Itching    Can take with Benadryl    Family History:  Family History  Problem Relation Age of Onset  . Cancer Maternal Grandmother     gynecologic  . Cancer Maternal Aunt     gynecologic  . Breast cancer Paternal Aunt   . Breast cancer Paternal Aunt   . Breast cancer Paternal Aunt   . Anesthesia problems Neg Hx   . Hypotension Neg Hx   . Malignant hyperthermia Neg Hx   . Pseudochol deficiency Neg Hx      Prior to Admission medications   Medication Sig Start Date End Date Taking? Authorizing Provider  acetaminophen (TYLENOL) 500 MG tablet Take 500 mg by mouth every 6 (six) hours as needed for mild pain or headache.   Yes Historical Provider, MD  albuterol (PROVENTIL HFA;VENTOLIN HFA) 108 (90 BASE) MCG/ACT inhaler Inhale 2 puffs into the lungs every 6 (six) hours as needed for wheezing (wheezing).   Yes Historical Provider, MD  amLODipine (NORVASC) 2.5 MG tablet Take 1 tablet by mouth daily. 05/05/14  Yes Historical Provider, MD  aspirin EC 81 MG tablet Take 81 mg by mouth  daily.   Yes Historical Provider, MD  atenolol (TENORMIN) 50 MG tablet Take 50 mg by mouth 2 (two) times daily.   Yes Historical Provider, MD  budesonide-formoterol (SYMBICORT) 160-4.5 MCG/ACT inhaler Inhale 2 puffs into the lungs 2 (two) times daily.   Yes Historical Provider, MD  calcium-vitamin D (OSCAL WITH D) 500-200 MG-UNIT per tablet Take 1 tablet by mouth daily.   Yes Historical Provider, MD  ergocalciferol (VITAMIN D2) 50000 UNITS capsule Take 50,000 Units by mouth once a week.   Yes Historical Provider, MD  furosemide (LASIX) 20 MG tablet Take 20 mg by mouth daily.  02/07/14  Yes Historical Provider, MD  hydroxypropyl methylcellulose (ISOPTO TEARS) 2.5 % ophthalmic solution Place 1 drop into both eyes 3 (three) times daily as needed for dry eyes.   Yes Historical Provider, MD  ibuprofen (ADVIL,MOTRIN) 400 MG tablet Take 1 tablet (400 mg total) by mouth every 8 (eight) hours as needed (pain). 05/13/14  Yes Erline Hau, MD  loratadine (CLARITIN) 10 MG tablet Take 10 mg by mouth daily.   Yes Historical Provider, MD  LORazepam (ATIVAN) 0.5 MG tablet Take 1 tablet (0.5 mg total) by mouth every 8 (eight) hours as needed for anxiety. 05/13/14  Yes Erline Hau, MD  omeprazole (PRILOSEC) 20 MG capsule Take 20 mg by mouth daily.    Yes Historical Provider, MD  predniSONE (DELTASONE) 1 MG tablet Take 2 mg by mouth daily with breakfast.  03/02/13  Yes Historical Provider, MD  saccharomyces boulardii (FLORASTOR) 250 MG capsule Take 1 capsule (250 mg total) by mouth 2 (two) times daily. 05/02/14  Yes Modena Jansky, MD  sodium chloride (OCEAN) 0.65 % SOLN nasal spray Place 1 spray into both nostrils as needed for congestion. 05/02/14  Yes Modena Jansky, MD  ST JOHNS WORT PO Take 1 capsule by mouth 2 (two) times daily.   Yes Historical Provider, MD  tiZANidine (ZANAFLEX) 4 MG tablet Take 4-8 mg by mouth every 8 (eight) hours as needed for muscle spasms.    Yes Historical Provider,  MD  traMADol (ULTRAM) 50 MG tablet Take 1 tablet (50 mg total) by mouth every 6 (six) hours as needed for moderate pain. 05/13/14  Yes Estela Leonie Green, MD  white petrolatum (VASELINE) GEL Apply 1 application topically as needed (anterior nares to prevent nose bleed). 05/02/14  Yes Modena Jansky, MD  promethazine (PHENERGAN) 25 MG  tablet Take 25 mg by mouth every 6 (six) hours as needed for nausea or vomiting.    Historical Provider, MD   Physical Exam: Filed Vitals:   07/13/14 0441 07/13/14 0633 07/13/14 0725 07/13/14 0729  BP: 150/82 150/64  135/69  Pulse: 73 79  86  Temp: 97.8 F (36.6 C)   98.1 F (36.7 C)  TempSrc: Oral   Oral  Resp: 16 21  22   Height:   5\' 3"  (1.6 m)   Weight:   58.559 kg (129 lb 1.6 oz)   SpO2: 94% 100%  100%    Physical Exam  Constitutional: Appears in mild distress due to shortness of breath.  HENT: Normocephalic. No tonsillar erythema or exudates Eyes: Conjunctivae and EOM are normal. PERRLA, no scleral icterus.  Neck: Normal ROM. Neck supple. No JVD. No tracheal deviation. No thyromegaly.  CVS: RRR, S1/S2 +, no murmurs, no gallops, no carotid bruit.  Pulmonary: rhonchorous breath sounds, wheezing in upper lung lobes  Abdominal: Soft. BS +,  no distension, tenderness, rebound or guarding.  Musculoskeletal: Normal range of motion. No edema and no tenderness.  Lymphadenopathy: No lymphadenopathy noted, cervical, inguinal. Neuro: Alert. Normal reflexes, muscle tone coordination. No focal neurologic deficits. Skin: Skin is warm and dry. No rash noted.  Psychiatric: Normal mood and affect. Behavior, judgment, thought content normal.   Labs on Admission:  Basic Metabolic Panel:  Recent Labs Lab 07/13/14 0457  NA 141  K 4.2  CL 105  CO2 23  GLUCOSE 104*  BUN 16  CREATININE 0.85  CALCIUM 9.7   Liver Function Tests:  Recent Labs Lab 07/13/14 0457  AST 12  ALT <5  ALKPHOS 62  BILITOT 0.2*  PROT 7.5  ALBUMIN 3.4*   No results found  for this basename: LIPASE, AMYLASE,  in the last 168 hours No results found for this basename: AMMONIA,  in the last 168 hours CBC:  Recent Labs Lab 07/13/14 0457  WBC 8.7  NEUTROABS 6.3  HGB 10.6*  HCT 35.7*  MCV 95.2  PLT 339   Cardiac Enzymes: No results found for this basename: CKTOTAL, CKMB, CKMBINDEX, TROPONINI,  in the last 168 hours BNP: No components found with this basename: POCBNP,  CBG:  Recent Labs Lab 07/13/14 0813  GLUCAP 103*    If 7PM-7AM, please contact night-coverage [email or web address] Password [email or web address] 07/13/2014, 9:45 AM

## 2014-07-13 NOTE — Progress Notes (Signed)
Brief Pharmacy note: Levaquin, pharmacy may adjust  Levaquin 750mg  q24 hr ordered, Clearance < 50 ml/min  Will adjust Levaquin to 750mg  q48hr x 3 doses  Levaquin d/c, Begin Vanc and Zosyn for HCAP  Minda Ditto PharmD

## 2014-07-13 NOTE — ED Notes (Signed)
Call placed to lab about blood cultures. They only have a label for one set. Put another order in and [name] said she would put the label on the cultures.

## 2014-07-13 NOTE — ED Notes (Signed)
Patient says this has been building up for three days and she thinks she has pneumonia again. Patient stated she has pain on the left side

## 2014-07-13 NOTE — ED Notes (Signed)
Patient says she was here two weeks ago for the same pain in the exact same way. Patient stated she was diagnosed with PNA and had to have fluid removed.

## 2014-07-13 NOTE — ED Notes (Signed)
Bed: WA17 Expected date: 07/13/14 Expected time: 4:25 AM Means of arrival: Ambulance Comments: Bed 17, EMS, [address] Pain

## 2014-07-13 NOTE — Progress Notes (Signed)
Pt absolutely refused to let lab draw any blood.  Pts glasses were also lost in ED, pt belonging bag and linens were searched by me and pt states the night RN took them off of her to give her a neb tx, ED still looking

## 2014-07-13 NOTE — ED Provider Notes (Signed)
Elderly female with a history of COPD and with recent admission to the hospital for community-acquired and in health care associated pneumonia with an associated parapneumonic effusion that required thoracentesis. She presents back to the hospital today with increased cough shortness of breath and left-sided pleuritic chest pain. She feels as though this is the same feeling she had when she was admitted before. On exam the patient is in moderate respiratory distress speaking in shortened sentences with expiratory wheezing, prolonged expiratory phase and shallow inspirations. She has decreased lung sounds bilaterally and a bedside ultrasound performed by myself shows recurrent pleural effusion on the left. She has no peripheral edema or JVD, she does have a systolic murmur. She will need repeat chest x-ray, albuterol treatments, likely admission to the hospital for her respiratory distress.  Medical screening examination/treatment/procedure(s) were conducted as a shared visit with non-physician practitioner(s) and myself.  I personally evaluated the patient during the encounter.  Clinical Impression: Respiratory distress, COPD exacerbation, Pleural Effusion      Johnna Acosta, MD 07/13/14 [phone]

## 2014-07-13 NOTE — Progress Notes (Signed)
ANTIBIOTIC CONSULT NOTE - INITIAL  Pharmacy Consult for Vancomycin & Zosyn  Indication: HCAP  Allergies  Allergen Reactions  . Keflex [Cephalexin] Itching    "severe itching"  . Nulecit [Na Ferric Gluc Cplx In Sucrose] Diarrhea, Nausea Only and Other (See Comments)      drop in BP  . Spiriva Handihaler [Tiotropium Bromide Monohydrate] Other (See Comments)    constipation  . Tramadol Itching    Skin burning and itching  . Adhesive [Tape] Itching  . Clarithromycin Itching  . Darvon Itching  . Epinephrine Itching  . Morphine And Related Itching  . Oxycodone Itching  . Sulfa Antibiotics Itching  . Vicodin [Hydrocodone-Acetaminophen] Itching    Can take with Benadryl   Patient Measurements: Height: 5\' 3"  (160 cm) Weight: 129 lb 1.6 oz (58.559 kg) IBW/kg (Calculated) : 52.4  Vital Signs: Temp: 98.1 F (36.7 C) (08/08 0729) Temp src: Oral (08/08 0729) BP: 135/69 mmHg (08/08 0729) Pulse Rate: 86 (08/08 0729) Intake/Output from previous day:   Intake/Output from this shift:    Labs:  Recent Labs  07/13/14 0457  WBC 8.7  HGB 10.6*  PLT 339  CREATININE 0.85   Estimated Creatinine Clearance: 45.1 ml/min (by C-G formula based on Cr of 0.85). No results found for this basename: VANCOTROUGH, VANCOPEAK, VANCORANDOM, GENTTROUGH, GENTPEAK, GENTRANDOM, TOBRATROUGH, TOBRAPEAK, TOBRARND, AMIKACINPEAK, AMIKACINTROU, AMIKACIN,  in the last 72 hours   Microbiology: No results found for this or any previous visit (from the past 720 hour(s)).  Medical History: Past Medical History  Diagnosis Date  . Pernicious anemia   . Osteoarthritis   . Fibromyalgia   . Skin lesion     chronic calcific cutaneous lesions  . HX of multiple bleeding ulcers 09/01/2011  . Hypertension     takes Atenolol daily  . Emphysema   . Bronchitis     hx  . Adenocarcinoma of gastroesophageal junction   . Dizziness     d/t crystals in ears that "roll around"and can't get them out  . Back pain   .  Osteoporosis   . Scoliosis   . Psoriasis   . H/O hiatal hernia   . GERD (gastroesophageal reflux disease)     takes Aciphex daily  . Gastric ulcer   . Constipation     since Chemo and Radiation;finished up in Nov 2012  . Diverticulosis   . History of colonic polyps   . Shingles     broke out 2wks ago;has been on meds and to finish up tomorrow  . Kidney stone 25+yrs ago  . Pernicious anemia   . Vitamin B deficiency   . Complication of anesthesia     shortness of breath and pain with gas   . COPD (chronic obstructive pulmonary disease)   . Shortness of breath     with exertion   . Blood transfusion    Medications:  Scheduled:  . amLODipine  2.5 mg Oral Daily  . aspirin EC  81 mg Oral Daily  . atenolol  50 mg Oral BID  . budesonide-formoterol  2 puff Inhalation BID  . calcium-vitamin D  1 tablet Oral Daily  . furosemide  40 mg Intravenous Once  . loratadine  10 mg Oral Daily  . [START ON 07/14/2014] methylPREDNISolone (SOLU-MEDROL) injection  40 mg Intravenous Daily  . pantoprazole  40 mg Oral Daily  . piperacillin-tazobactam (ZOSYN)  IV  3.375 g Intravenous Q8H  . saccharomyces boulardii  250 mg Oral BID  . sodium chloride  3 mL Intravenous Q12H  . tiotropium  18 mcg Inhalation Daily  . vancomycin  500 mg Intravenous Q12H  . vancomycin  1,000 mg Intravenous Once  . [START ON 07/17/2014] Vitamin D (Ergocalciferol)  50,000 Units Oral Weekly   Anti-infectives   Start     Dose/Rate Route Frequency Ordered Stop   07/13/14 2200  vancomycin (VANCOCIN) 500 mg in sodium chloride 0.9 % 100 mL IVPB     500 mg 100 mL/hr over 60 Minutes Intravenous Every 12 hours 07/13/14 1023     07/13/14 1200  piperacillin-tazobactam (ZOSYN) IVPB 3.375 g     3.375 g 12.5 mL/hr over 240 Minutes Intravenous Every 8 hours 07/13/14 1022     07/13/14 1100  levofloxacin (LEVAQUIN) IVPB 750 mg  Status:  Discontinued     750 mg 100 mL/hr over 90 Minutes Intravenous Every 48 hours 07/13/14 1000 07/13/14  1016   07/13/14 1100  vancomycin (VANCOCIN) IVPB 1000 mg/200 mL premix     1,000 mg 200 mL/hr over 60 Minutes Intravenous  Once 07/13/14 1023     07/13/14 0600  levofloxacin (LEVAQUIN) IVPB 500 mg     500 mg 100 mL/hr over 60 Minutes Intravenous  Once 07/13/14 0604 07/13/14 0717     Assessment: [name] with recent admit 5/21 and 5/2 for PNA. Three day hx of worsening SHOB, residual left effusion on Xray. Likely COPD exacerbation, but treat as HCAP with recent hospitalizations. Begin Vancomycin & Zosyn  Renal function stable, patient tolerated Vancomycin & Zosyn last admit  Goal of Therapy:  Vanc trough 15-20 mcg/ml  Plan:   Zosyn 3.375gm q8hr - 4 hr infusion  Vancomycin 1gm x1, then 500mg  q12  Minda Ditto PharmD Pager [phone] 07/13/2014, 10:39 AM

## 2014-07-13 NOTE — ED Provider Notes (Signed)
CSN: [id number]     Arrival date & time 07/13/14  6301 History   First MD Initiated Contact with Patient 07/13/14 0435     Chief Complaint  Patient presents with  . Flank Pain    left sided pain    HPI Patient with PMH of COPD, PNA two weeks ago with hospitalization presents with increasing SOB and chest pain. Chest pain is exacerbated by breathing and started 3 days ago. SOB has increased over the past three days with productive cough. Patient was admitted in May and June for respiratory distress and PNA. Patient denies peripheral edema, palpitations, fevers, chills, weight loss or night sweats. Patient endorses heartburn and does sleep with elevated head of bed. Patient denies N/V/D, changes in stool or urinary changes or weakness.  Past Medical History  Diagnosis Date  . Pernicious anemia   . Osteoarthritis   . Fibromyalgia   . Skin lesion     chronic calcific cutaneous lesions  . HX of multiple bleeding ulcers 09/01/2011  . Hypertension     takes Atenolol daily  . Emphysema   . Bronchitis     hx  . Adenocarcinoma of gastroesophageal junction   . Dizziness     d/t crystals in ears that "roll around"and can't get them out  . Back pain   . Osteoporosis   . Scoliosis   . Psoriasis   . H/O hiatal hernia   . GERD (gastroesophageal reflux disease)     takes Aciphex daily  . Gastric ulcer   . Constipation     since Chemo and Radiation;finished up in Nov 2012  . Diverticulosis   . History of colonic polyps   . Shingles     broke out 2wks ago;has been on meds and to finish up tomorrow  . Kidney stone 25+yrs ago  . Pernicious anemia   . Vitamin B deficiency   . Complication of anesthesia     shortness of breath and pain with gas   . COPD (chronic obstructive pulmonary disease)   . Shortness of breath     with exertion   . Blood transfusion    Past Surgical History  Procedure Laterality Date  . Right hip replacement  2001  . Tonsillectomy      as a child  . Tubal  ligation  1975  . Appendectomy  1975  . Cataract surgery  10+yrs ago  . Esophagogastroduodenoscopy    . Colonoscopy    . Extubation  12/20/2011       . Partial esophagectomy  12/20/2011    Procedure: ESOPHAGECTOMY PARTIAL;  Surgeon: Pierre Bali, MD;  Location: [facility];  Service: Thoracic;  Laterality: N/A;  . Total hip arthroplasty  06/30/2012    Procedure: TOTAL HIP ARTHROPLASTY ANTERIOR APPROACH;  Surgeon: Mcarthur Rossetti, MD;  Location: [facility];  Service: Orthopedics;  Laterality: Left;  Left total hip arthroplasty   Family History  Problem Relation Age of Onset  . Cancer Maternal Grandmother     gynecologic  . Cancer Maternal Aunt     gynecologic  . Breast cancer Paternal Aunt   . Breast cancer Paternal Aunt   . Breast cancer Paternal Aunt   . Anesthesia problems Neg Hx   . Hypotension Neg Hx   . Malignant hyperthermia Neg Hx   . Pseudochol deficiency Neg Hx    History  Substance Use Topics  . Smoking status: Former Smoker -- 3.00 packs/day    Types: Cigarettes    Quit  date: 12/07/1991  . Smokeless tobacco: Never Used  . Alcohol Use: No   OB History   Grav Para Term Preterm Abortions TAB SAB Ect Mult Living                 Review of Systems  Constitutional: Negative for fever and chills.  HENT: Negative for congestion and rhinorrhea.   Eyes: Negative for visual disturbance.  Respiratory: Positive for cough and shortness of breath.   Cardiovascular: Positive for chest pain. Negative for palpitations and leg swelling.  Gastrointestinal: Negative for nausea, vomiting and diarrhea.  Genitourinary: Negative for dysuria and hematuria.  Musculoskeletal: Negative for back pain and gait problem.  Skin: Negative for rash.  Neurological: Negative for weakness and headaches.      Allergies  Keflex; Nulecit; Spiriva handihaler; Tramadol; Adhesive; Clarithromycin; Darvon; Epinephrine; Morphine and related; Oxycodone; Sulfa antibiotics; and Vicodin  Home  Medications   Prior to Admission medications   Medication Sig Start Date End Date Taking? Authorizing Provider  acetaminophen (TYLENOL) 500 MG tablet Take 500 mg by mouth every 6 (six) hours as needed for mild pain or headache.   Yes Historical Provider, MD  acetic acid-hydrocortisone (VOSOL-HC) otic solution Place 1 drop into both ears 3 (three) times daily as needed (pain and itching).  04/17/14  Yes Historical Provider, MD  albuterol (PROVENTIL HFA;VENTOLIN HFA) 108 (90 BASE) MCG/ACT inhaler Inhale 2 puffs into the lungs every 6 (six) hours as needed for wheezing (wheezing).   Yes Historical Provider, MD  amLODipine (NORVASC) 2.5 MG tablet Take 1 tablet by mouth daily. 05/05/14  Yes Historical Provider, MD  aspirin EC 81 MG tablet Take 81 mg by mouth daily.   Yes Historical Provider, MD  atenolol (TENORMIN) 50 MG tablet Take 50 mg by mouth 2 (two) times daily.   Yes Historical Provider, MD  budesonide-formoterol (SYMBICORT) 160-4.5 MCG/ACT inhaler Inhale 2 puffs into the lungs 2 (two) times daily.   Yes Historical Provider, MD  calcium-vitamin D (OSCAL WITH D) 500-200 MG-UNIT per tablet Take 1 tablet by mouth daily.   Yes Historical Provider, MD  ergocalciferol (VITAMIN D2) 50000 UNITS capsule Take 50,000 Units by mouth once a week.   Yes Historical Provider, MD  furosemide (LASIX) 20 MG tablet Take 20 mg by mouth daily.  02/07/14  Yes Historical Provider, MD  hydroxypropyl methylcellulose (ISOPTO TEARS) 2.5 % ophthalmic solution Place 1 drop into both eyes 3 (three) times daily as needed for dry eyes.   Yes Historical Provider, MD  ibuprofen (ADVIL,MOTRIN) 400 MG tablet Take 1 tablet (400 mg total) by mouth every 8 (eight) hours as needed (pain). 05/13/14  Yes Erline Hau, MD  loratadine (CLARITIN) 10 MG tablet Take 10 mg by mouth daily.   Yes Historical Provider, MD  LORazepam (ATIVAN) 0.5 MG tablet Take 1 tablet (0.5 mg total) by mouth every 8 (eight) hours as needed for anxiety.  05/13/14  Yes Erline Hau, MD  omeprazole (PRILOSEC) 20 MG capsule Take 20 mg by mouth daily.    Yes Historical Provider, MD  predniSONE (DELTASONE) 1 MG tablet Take 2 mg by mouth daily with breakfast.  03/02/13  Yes Historical Provider, MD  saccharomyces boulardii (FLORASTOR) 250 MG capsule Take 1 capsule (250 mg total) by mouth 2 (two) times daily. 05/02/14  Yes Modena Jansky, MD  sodium chloride (OCEAN) 0.65 % SOLN nasal spray Place 1 spray into both nostrils as needed for congestion. 05/02/14  Yes Modena Jansky, MD  [facility] WORT PO Take 1 capsule by mouth 2 (two) times daily.   Yes Historical Provider, MD  tiZANidine (ZANAFLEX) 4 MG tablet Take 4-8 mg by mouth every 8 (eight) hours as needed for muscle spasms.    Yes Historical Provider, MD  traMADol (ULTRAM) 50 MG tablet Take 1 tablet (50 mg total) by mouth every 6 (six) hours as needed for moderate pain. 05/13/14  Yes Estela Leonie Green, MD  white petrolatum (VASELINE) GEL Apply 1 application topically as needed (anterior nares to prevent nose bleed). 05/02/14  Yes Modena Jansky, MD  promethazine (PHENERGAN) 25 MG tablet Take 25 mg by mouth every 6 (six) hours as needed for nausea or vomiting.    Historical Provider, MD   BP 150/82  Pulse 73  Temp(Src) 97.8 F (36.6 C) (Oral)  Resp 16  SpO2 94% Physical Exam  Nursing note and vitals reviewed. Constitutional: She appears well-developed and well-nourished. No distress.  HENT:  Head: Normocephalic and atraumatic.  Mouth/Throat: Oropharynx is clear and moist.  Eyes: Conjunctivae and EOM are normal. Right eye exhibits no discharge. Left eye exhibits no discharge. No scleral icterus.  Neck: No JVD present.  JVP elevated.  Cardiovascular: Normal rate, regular rhythm and intact distal pulses.   Murmur heard. Pulmonary/Chest: Effort normal. No respiratory distress.  Decreased breath sounds bilaterally with wheezing and rales on left lower lung. Patient in moderate  respiratory distress with accessory muscle. Patient is tachypnic and cannot speak in complete sentences.  Abdominal: Soft. Bowel sounds are normal. She exhibits no distension. There is no tenderness.  Musculoskeletal: Normal range of motion. She exhibits no edema and no tenderness.  Neurological: She is alert. She exhibits normal muscle tone. Coordination normal.  Skin: Skin is warm and dry. She is not diaphoretic.  Psychiatric: She has a normal mood and affect. Her behavior is normal.    ED Course  Procedures (including critical care time) Labs Review Labs Reviewed  CBC WITH DIFFERENTIAL - Abnormal; Notable for the following:    RBC 3.75 (*)    Hemoglobin 10.6 (*)    HCT 35.7 (*)    MCHC 29.7 (*)    RDW 17.6 (*)    All other components within normal limits  COMPREHENSIVE METABOLIC PANEL - Abnormal; Notable for the following:    Glucose, Bld 104 (*)    Albumin 3.4 (*)    Total Bilirubin 0.2 (*)    GFR calc non Af Amer 64 (*)    GFR calc Af Amer 74 (*)    All other components within normal limits  PRO B NATRIURETIC PEPTIDE - Abnormal; Notable for the following:    Pro B Natriuretic peptide (BNP) 3079.0 (*)    All other components within normal limits  CULTURE, BLOOD (ROUTINE X 2)  LACTIC ACID, PLASMA    Imaging Review No results found.   EKG Interpretation None      MDM   Final diagnoses:  Respiratory distress   Patient with multiple hospitalizations for COPD and PNA presents with increasing SOB and chest pain. Patient is afebrile, tachypnic stating 94% on 2L [place]. Patient is in moderate respiratory distress with accessory muscle use. Patient has mild anemia and unremarkable WBC. BNP is 3079. CXR shows left pulmonary effusion but to a lesser extent than prior effusion. The etiology is uncertain as COPD, CHF and PNA may present with effusions. Due to her respiratory distress, multiple commodities and recent hospitalizations, her medical evaluation and treatment would be best  in an inpatient setting. Patient discussed with hospitalist service who agrees with the plan to admit.  Discussed all results and patient verbalizes understanding and agrees with plan.  This is a shared patient. This patient was discussed with the physician who saw and evaluated the patient.  Meds given in ED:  Medications  levofloxacin (LEVAQUIN) IVPB 500 mg (500 mg Intravenous New Bag/Given 07/13/14 0617)  albuterol (PROVENTIL,VENTOLIN) solution continuous neb (not administered)  albuterol (PROVENTIL) (2.5 MG/3ML) 0.083% nebulizer solution 5 mg (5 mg Nebulization Given 07/13/14 0618)  methylPREDNISolone sodium succinate (SOLU-MEDROL) 125 mg/2 mL injection 80 mg (80 mg Intravenous Given 07/13/14 0619)    New Prescriptions   No medications on file         Pura Spice, PA-C 07/13/14 [place], PA-C 07/13/14 0710

## 2014-07-13 NOTE — Progress Notes (Signed)
Patient ID: [name], female   DOB: [birth year], 80 y.o.   MRN: [id number] Request received for left therapeutic thoracentesis on pt. Limited US left post chest  reveals only small loculated effusion today ; no effusion on right. Case d/w Dr. Charlies Silvers. Procedure cancelled. Pt aware.

## 2014-07-13 NOTE — ED Provider Notes (Signed)
Medical screening examination/treatment/procedure(s) were conducted as a shared visit with non-physician practitioner(s) and myself.  I personally evaluated the patient during the encounter  Please see my separate respective documentation pertaining to this patient encounter   Johnna Acosta, MD 07/13/14 [phone]

## 2014-07-13 NOTE — ED Notes (Signed)
Pt is on cardiac monitoring.

## 2014-07-14 DIAGNOSIS — R0989 Other specified symptoms and signs involving the circulatory and respiratory systems: Secondary | ICD-10-CM

## 2014-07-14 DIAGNOSIS — R0609 Other forms of dyspnea: Secondary | ICD-10-CM

## 2014-07-14 LAB — LEGIONELLA ANTIGEN, URINE: Legionella Antigen, Urine: NEGATIVE

## 2014-07-14 LAB — COMPREHENSIVE METABOLIC PANEL
ALT: <5 U/L (ref 0–35)
ANION GAP: 13 (ref 5–15)
AST: 9 U/L (ref 0–37)
Albumin: 2.9 g/dL — ABNORMAL LOW (ref 3.5–5.2)
Alkaline Phosphatase: 53 U/L (ref 39–117)
BILIRUBIN TOTAL: 0.2 mg/dL — AB (ref 0.3–1.2)
BUN: 15 mg/dL (ref 6–23)
CHLORIDE: 104 meq/L (ref 96–112)
CO2: 22 mEq/L (ref 19–32)
Calcium: 9.9 mg/dL (ref 8.4–10.5)
Creatinine, Ser: 0.89 mg/dL (ref 0.50–1.10)
GFR calc Af Amer: 70 mL/min — ABNORMAL LOW (ref >90)
GFR calc non Af Amer: 60 mL/min — ABNORMAL LOW (ref >90)
Glucose, Bld: 116 mg/dL — ABNORMAL HIGH (ref 70–99)
Potassium: 3.9 mEq/L (ref 3.7–5.3)
Sodium: 139 mEq/L (ref 137–147)
TOTAL PROTEIN: 6.7 g/dL (ref 6.0–8.3)

## 2014-07-14 LAB — CBC
HCT: 31.9 % — ABNORMAL LOW (ref 36.0–46.0)
Hemoglobin: 9.6 g/dL — ABNORMAL LOW (ref 12.0–15.0)
MCH: 28.1 pg (ref 26.0–34.0)
MCHC: 30.1 g/dL (ref 30.0–36.0)
MCV: 93.3 fL (ref 78.0–100.0)
Platelets: 315 10*3/uL (ref 150–400)
RBC: 3.42 MIL/uL — ABNORMAL LOW (ref 3.87–5.11)
RDW: 17.4 % — AB (ref 11.5–15.5)
WBC: 8.5 10*3/uL (ref 4.0–10.5)

## 2014-07-14 LAB — GLUCOSE, CAPILLARY: Glucose-Capillary: 98 mg/dL (ref 70–99)

## 2014-07-14 MED ORDER — LEVOFLOXACIN 750 MG PO TABS: 750.0000 mg | ORAL_TABLET | Freq: Every day | ORAL | Status: AC

## 2014-07-14 MED ORDER — PREDNISONE (PAK) 10 MG PO TABS: ORAL_TABLET | Freq: Every day | ORAL | Status: DC

## 2014-07-14 NOTE — Discharge Summary (Signed)
Physician Discharge Summary  [name] HUT:[id number] DOB: 1934-12-02 DOA: 07/13/2014  PCP: [name], MD  Admit date: 07/13/2014 Discharge date: 07/14/2014  Time spent: > 35 minutes  Recommendations for Outpatient Follow-up:  Please be sure to follow up with your primary care physician in 1-2 weeks or sooner  Discharge Diagnoses:  Principal Problem:   Acute respiratory failure with hypoxia Active Problems:   Pernicious anemia   Hypertension   CAP (community acquired pneumonia)   Pleural effusion   Diastolic CHF, acute on chronic   COPD with acute exacerbation   Discharge Condition: stable  Diet recommendation: Heart healthy, low sodium diet  Filed Weights   07/13/14 0725 07/14/14 0527  Weight: 58.559 kg (129 lb 1.6 oz) 58.605 kg (129 lb 3.2 oz)    History of present illness:  From original HPI: 80 year old femal ewith past medical history of adenocarcinoma of GE junction (Follows with Dr. [name]), COPD, previous admission for pneumonia who presented to [facility] ED 07/13/2014 with worsening shortness of breath and rib cage pain for past few days prior to this admission.   Hospital Course:  Acute respiratory failure with hypoxia / Acute COPD exacerbation / Left pleural effusion / HCAP  Resolved patient breathing comfortably on room air and requesting discharge Place on Oral prednisone regimen and levaquin  Active Problems:   Diastolic CHF, acute on chronic  Heart healthy diet and salt restriction  Continue home regimen including home lasix regimen.  Pernicious anemia  Hemoglobin is stable. No current indications for transfusion   Hypertension  Continue atenolol , hold amlodipine on discharge  Procedures:  No thoracentesis as patient did not have enough fluid for removal  Consultations:  none  Discharge Exam: Filed Vitals:   07/14/14 0527  BP: 122/58  Pulse: 66  Temp: 98.1 F (36.7 C)  Resp: 18    General: Pt in nad, alert and  awake Cardiovascular: rrr, no mrg Respiratory: cta bl, no wheezes  Discharge Instructions You were cared for by a hospitalist during your hospital stay. If you have any questions about your discharge medications or the care you received while you were in the hospital after you are discharged, you can call the unit and asked to speak with the hospitalist on call if the hospitalist that took care of you is not available. Once you are discharged, your primary care physician will handle any further medical issues. Please note that NO REFILLS for any discharge medications will be authorized once you are discharged, as it is imperative that you return to your primary care physician (or establish a relationship with a primary care physician if you do not have one) for your aftercare needs so that they can reassess your need for medications and monitor your lab values.  Discharge Instructions   Call MD for:  difficulty breathing, headache or visual disturbances    Complete by:  As directed      Call MD for:  extreme fatigue    Complete by:  As directed      Call MD for:  redness, tenderness, or signs of infection (pain, swelling, redness, odor or green/yellow discharge around incision site)    Complete by:  As directed      Call MD for:  severe uncontrolled pain    Complete by:  As directed      Call MD for:  temperature >100.4    Complete by:  As directed      Diet - low sodium heart  healthy    Complete by:  As directed      Increase activity slowly    Complete by:  As directed             Medication List    STOP taking these medications       acetic acid-hydrocortisone otic solution  Commonly known as:  VOSOL-HC     amLODipine 2.5 MG tablet  Commonly known as:  NORVASC     predniSONE 1 MG tablet  Commonly known as:  DELTASONE  Replaced by:  predniSONE 10 MG tablet     traMADol 50 MG tablet  Commonly known as:  ULTRAM      TAKE these medications       acetaminophen 500 MG  tablet  Commonly known as:  TYLENOL  Take 500 mg by mouth every 6 (six) hours as needed for mild pain or headache.     albuterol 108 (90 BASE) MCG/ACT inhaler  Commonly known as:  PROVENTIL HFA;VENTOLIN HFA  Inhale 2 puffs into the lungs every 6 (six) hours as needed for wheezing (wheezing).     aspirin EC 81 MG tablet  Take 81 mg by mouth daily.     atenolol 50 MG tablet  Commonly known as:  TENORMIN  Take 50 mg by mouth 2 (two) times daily.     budesonide-formoterol 160-4.5 MCG/ACT inhaler  Commonly known as:  SYMBICORT  Inhale 2 puffs into the lungs 2 (two) times daily.     calcium-vitamin D 500-200 MG-UNIT per tablet  Commonly known as:  OSCAL WITH D  Take 1 tablet by mouth daily.     ergocalciferol 50000 UNITS capsule  Commonly known as:  VITAMIN D2  Take 50,000 Units by mouth once a week.     furosemide 20 MG tablet  Commonly known as:  LASIX  Take 20 mg by mouth daily.     hydroxypropyl methylcellulose 2.5 % ophthalmic solution  Commonly known as:  ISOPTO TEARS  Place 1 drop into both eyes 3 (three) times daily as needed for dry eyes.     ibuprofen 400 MG tablet  Commonly known as:  ADVIL,MOTRIN  Take 1 tablet (400 mg total) by mouth every 8 (eight) hours as needed (pain).     levofloxacin 750 MG tablet  Commonly known as:  LEVAQUIN  Take 1 tablet (750 mg total) by mouth daily.  Start taking on:  07/15/2014     loratadine 10 MG tablet  Commonly known as:  CLARITIN  Take 10 mg by mouth daily.     LORazepam 0.5 MG tablet  Commonly known as:  ATIVAN  Take 1 tablet (0.5 mg total) by mouth every 8 (eight) hours as needed for anxiety.     omeprazole 20 MG capsule  Commonly known as:  PRILOSEC  Take 20 mg by mouth daily.     predniSONE 10 MG tablet  Commonly known as:  STERAPRED UNI-PAK  Take by mouth daily. Take 40 mg po daily for the next 3 days, then take 30 mg po daily for the next 3 days, then take 20 mg po daily for the next 3 days, then take 10 mg po  daily for the next 3 days. Then continue your home regimen of 2 mg po daily     promethazine 25 MG tablet  Commonly known as:  PHENERGAN  Take 25 mg by mouth every 6 (six) hours as needed for nausea or vomiting.     saccharomyces boulardii 250  MG capsule  Commonly known as:  FLORASTOR  Take 1 capsule (250 mg total) by mouth 2 (two) times daily.     sodium chloride 0.65 % Soln nasal spray  Commonly known as:  OCEAN  Place 1 spray into both nostrils as needed for congestion.     ST JOHNS WORT PO  Take 1 capsule by mouth 2 (two) times daily.     tiZANidine 4 MG tablet  Commonly known as:  ZANAFLEX  Take 4-8 mg by mouth every 8 (eight) hours as needed for muscle spasms.     white petrolatum Gel  Commonly known as:  VASELINE  Apply 1 application topically as needed (anterior nares to prevent nose bleed).       Allergies  Allergen Reactions  . Keflex [Cephalexin] Itching    "severe itching"  . Nulecit [Na Ferric Gluc Cplx In Sucrose] Diarrhea, Nausea Only and Other (See Comments)      drop in BP  . Spiriva Handihaler [Tiotropium Bromide Monohydrate] Other (See Comments)    constipation  . Tramadol Itching    Skin burning and itching  . Adhesive [Tape] Itching  . Clarithromycin Itching  . Darvon Itching  . Epinephrine Itching  . Morphine And Related Itching  . Oxycodone Itching  . Sulfa Antibiotics Itching  . Vicodin [Hydrocodone-Acetaminophen] Itching    Can take with Benadryl       Follow-up Information   Follow up with [name], MD.   Specialty:  Family Medicine   Contact information:   [place] [address] [address] [phone]        The results of significant diagnostics from this hospitalization (including imaging, microbiology, ancillary and laboratory) are listed below for reference.    Significant Diagnostic Studies: Dg Chest 2 View  07/13/2014   CLINICAL DATA:  Shortness of breath. Left anterior and lateral chest wall pain.  EXAM: CHEST   2 VIEW  COMPARISON:  Chest radiograph performed 05/07/2014, and CT of the chest performed 05/08/2014  FINDINGS: The lungs are hyperexpanded, with flattening of the hemidiaphragms, compatible with COPD. A residual small left-sided pleural effusion is seen, with associated atelectasis. There is no evidence of pneumothorax.  The cardiomediastinal silhouette is borderline normal in size. Prominence of the right hilum appears stable. The patient's known gastric pull-through is not well assessed on this study. No acute osseous abnormalities are seen. Changes of vertebroplasty are seen along the mid thoracic spine.  IMPRESSION: Findings of COPD. Residual small left pleural effusion, with associated atelectasis.   Electronically Signed   By: Garald Balding M.D.   On: 07/13/2014 06:30   [place] Chest  07/13/2014   CLINICAL DATA:  Evaluate pleural fluid for a thoracentesis.  EXAM: CHEST ULTRASOUND  COMPARISON:  Chest radiograph 07/13/2014 and CT 05/08/2014  FINDINGS: There is a trace amount of pleural fluid in the left chest. Largest fluid pocket measures 2 cm.  IMPRESSION: Trace amount of left pleural fluid. There was not enough fluid for a safe thoracentesis.   Electronically Signed   By: Markus Daft M.D.   On: 07/13/2014 11:25    Microbiology: Recent Results (from the past 240 hour(s))  CULTURE, EXPECTORATED SPUTUM-ASSESSMENT     Status: None   Collection Time    07/13/14 11:16 AM      Result Value Ref Range Status   Specimen Description SPUTUM   Final   Special Requests NONE   Final   Sputum evaluation     Final  Value: THIS SPECIMEN IS ACCEPTABLE. RESPIRATORY CULTURE REPORT TO FOLLOW.   Report Status 07/13/2014 FINAL   Final     Labs: Basic Metabolic Panel:  Recent Labs Lab 07/13/14 0457 07/14/14 0353  NA 141 139  K 4.2 3.9  CL 105 104  CO2 23 22  GLUCOSE 104* 116*  BUN 16 15  CREATININE 0.85 0.89  CALCIUM 9.7 9.9   Liver Function Tests:  Recent Labs Lab 07/13/14 0457 07/14/14 0353  AST  12 9  ALT <5 <5  ALKPHOS 62 53  BILITOT 0.2* 0.2*  PROT 7.5 6.7  ALBUMIN 3.4* 2.9*   No results found for this basename: LIPASE, AMYLASE,  in the last 168 hours No results found for this basename: AMMONIA,  in the last 168 hours CBC:  Recent Labs Lab 07/13/14 0457 07/14/14 0353  WBC 8.7 8.5  NEUTROABS 6.3  --   HGB 10.6* 9.6*  HCT 35.7* 31.9*  MCV 95.2 93.3  PLT 339 315   Cardiac Enzymes: No results found for this basename: CKTOTAL, CKMB, CKMBINDEX, TROPONINI,  in the last 168 hours BNP: BNP (last 3 results)  Recent Labs  05/06/14 2345 07/13/14 0457  PROBNP 5856.0* 3079.0*   CBG:  Recent Labs Lab 07/13/14 0813 07/14/14 0754  GLUCAP 103* 98       Signed:  Velvet Bathe  [facility] 07/14/2014, 10:36 AM

## 2014-07-16 LAB — CULTURE, RESPIRATORY W GRAM STAIN: Culture: NORMAL

## 2014-07-16 LAB — CULTURE, RESPIRATORY

## 2014-07-19 LAB — CULTURE, BLOOD (SINGLE): CULTURE: NO GROWTH

## 2014-07-19 LAB — CULTURE, BLOOD (ROUTINE X 2): Culture: NO GROWTH

## 2014-08-22 ENCOUNTER — Ambulatory Visit (HOSPITAL_BASED_OUTPATIENT_CLINIC_OR_DEPARTMENT_OTHER): Payer: Medicare Other | Admitting: Nurse Practitioner

## 2014-08-22 VITALS — BP 134/82 | HR 69 | Temp 99.0°F | Resp 18 | Ht 63.0 in | Wt 133.1 lb

## 2014-08-22 DIAGNOSIS — R229 Localized swelling, mass and lump, unspecified: Secondary | ICD-10-CM

## 2014-08-22 DIAGNOSIS — D649 Anemia, unspecified: Secondary | ICD-10-CM

## 2014-08-22 DIAGNOSIS — Z8501 Personal history of malignant neoplasm of esophagus: Secondary | ICD-10-CM

## 2014-08-22 DIAGNOSIS — D510 Vitamin B12 deficiency anemia due to intrinsic factor deficiency: Secondary | ICD-10-CM

## 2014-08-22 DIAGNOSIS — C160 Malignant neoplasm of cardia: Secondary | ICD-10-CM

## 2014-08-22 NOTE — Progress Notes (Signed)
[place] OFFICE PROGRESS NOTE   Diagnosis:  Esophagus cancer  INTERVAL HISTORY:   [name] returns as scheduled. She continues to have solid dysphagia. This has been occurring since the esophagus surgery 3 years ago. She reports a good appetite. She notes nausea with "overeating". Energy level is poor. No constipation or diarrhea. She reports stable dyspnea. She has had multiple hospitalizations with "pneumonia" since her last visit here. She has arthritis pain.  Objective:  Vital signs in last 24 hours:  Blood pressure 134/82, pulse 69, temperature 99 F (37.2 C), temperature source Oral, resp. rate 18, height 5\' 3"  (1.6 m), weight 133 lb 1.6 oz (60.374 kg), SpO2 100.00%.    HEENT: No thrush or ulcers. Lymphatics: No palpable cervical, supraclavicular or axillary lymph nodes. Resp: Distant breath sounds. Lungs clear bilaterally. Cardio: Regular rate and rhythm. 2/6 systolic murmur. GI: Abdomen is soft. No hepatomegaly. Large ventral hernia. Vascular: No leg edema. Neuro: Alert and oriented.    Lab Results:  Lab Results  Component Value Date   WBC 8.5 07/14/2014   HGB 9.6* 07/14/2014   HCT 31.9* 07/14/2014   MCV 93.3 07/14/2014   PLT 315 07/14/2014   NEUTROABS 6.3 07/13/2014    Imaging:  No results found.  Medications: I have reviewed the patient's current medications.  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 Staging CT scan showed no evidence for distant metastatic disease. Staging PET scan 08/20/2011 revealed hypermetabolic activity at the distal esophagus and no evidence for metastatic disease. She completed radiation 08/26/2011 through 10/04/2011 and weekly Taxol/carboplatin chemotherapy 08/31/2011 through 10/04/2011. -restaging PET scan 11/05/2011 confirmed a decrease in the FDG activity at the distal esophagus with no  evidence for metastatic disease. Status post esophagogastrectomy 12/20/2011 with pathology showing invasive adenocarcinoma, moderately differentiated, scattered foci, the largest spanning 0.26 cm; adenocarcinoma involved the muscularis mucosa; the surgical resection margins were negative; no evidence of carcinoma in 9 of 9 lymph nodes. Restaging CT scans 12/12/2012 showed no evidence of recurrent or metastatic disease.  #2 Solid/liquid dysphagia, subxiphoid discomfort, and weight loss secondary to esophagus cancer. There was initial improvement in the dysphagia. She had recurrent dysphagia. Esophagram on 09/18/2012 revealed no mass or obstruction. Poor motility was noted in the gastric pull-through. She has persistent solid dysphagia.  #3 Subserosal gastrointestinal stromal tumor, low-grade, spanning 0.8 cm noted on pathology 12/20/2011.  #4 Status post left total hip replacement 06/30/2012.  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 Resolved.  #6 Chronic anemia with a history of chronic gastrointestinal blood loss. The hemoglobin was in normal range on 03/16/2013  #7 Osteoarthritis.  #8 "Pernicious" anemia.  #9 Fibromyalgia.  #10 Chronic "calcific" cutaneous lesions.  #11 Fever and erythema with associated tenderness involving one of the calcific cutaneous lesions at the left anterior lateral leg on 09/21/2011. Improved following Keflex therapy.  #12 Multiple medication allergies.  #13 Hypertension.  #14 Superficial phlebitis right arm on 10/18/2011.  #15 Abdominal wall/incisional hernia.  #16 Compression fracture at T8 and T9, likely osteoporotic, status post a T8 vertebroplasty 01/10/2013. T9 vertebroplasty 01/29/2013  #17 status post a referral to the urology Center for evaluation of flank pain  #18 "reaction "to IV iron in March and April of 2014.  #19 Anemia. She has received IV iron in the past. Most recently she had "allergic reactions" to IV iron in March and April of this year.  She reports seeing Dr. Jamse Arn for this in the  past.  #20 COPD   Disposition: [name] remains in clinical remission from esophagus cancer. She is now 3 years out from the initial diagnosis.  She will return for a followup visit in one year. She will contact the office in the interim with any problems.  Plan reviewed with Dr. [name].    Ned Card ANP/GNP-BC   08/22/2014  2:50 PM

## 2014-08-23 ENCOUNTER — Telehealth: Payer: Self-pay | Admitting: Oncology

## 2014-08-23 NOTE — Telephone Encounter (Signed)
s.w.l pt and advised on Sept 2016 appt.Marland KitchenMarland KitchenMarland KitchenMarland Kitchenpt ok and aware

## 2014-09-09 ENCOUNTER — Telehealth: Payer: Self-pay | Admitting: *Deleted

## 2014-09-09 ENCOUNTER — Telehealth: Payer: Self-pay | Admitting: Nurse Practitioner

## 2014-09-09 NOTE — Telephone Encounter (Signed)
I contacted [name] to followup regarding her call to the nurse earlier today. She reports increased shortness of breath and chest pain which has been occurring for the past 3-4 months. She is concerned she has "fluid" around the lungs. I discussed her symptoms with Dr. [name]. We recommended to her that she contact her primary care provider to be seen today and if unable to be seen today she should go to the emergency Department. She expressed her understanding of our recommendation.

## 2014-09-09 NOTE — Telephone Encounter (Signed)
1230 pm: Received VM from patient asking to speak with Lattie Haw NP. Returned phone call, patient states "I'm really short of breath, and I've had bouts of pneumonia with fluid taken off my lungs They did not want to take more fluid off because they were scared my lungs would colapse". C/o shortness of breath and "pain all over".  Able to talk, but noticeably short of breath, asking if Dr. [name] or Lattie Haw NP could see her for outpatient "removal of fluid". Patient has not contacted PCP regarding this.  Information relayed to [facility] NP for recommendation.

## 2015-04-15 ENCOUNTER — Telehealth: Payer: Self-pay | Admitting: Internal Medicine

## 2015-04-15 NOTE — Telephone Encounter (Signed)
Received records from [facility] for appointment on 05/07/15 with Dr Debara Pickett.  Records given to [facility] (medical records) for Dr Lysbeth Penner schedule on 05/07/15. lp

## 2015-04-26 ENCOUNTER — Encounter (HOSPITAL_COMMUNITY)
Admission: EM | Disposition: A | Discharge status: Skilled Nursing Facility | Payer: Self-pay | Source: Home / Self Care | Attending: Internal Medicine

## 2015-04-26 ENCOUNTER — Emergency Department (HOSPITAL_COMMUNITY): Payer: Medicare Other

## 2015-04-26 ENCOUNTER — Inpatient Hospital Stay (HOSPITAL_COMMUNITY)
Admission: EM | Admit: 2015-04-26 | Discharge: 2015-04-30 | DRG: 563 | Disposition: A | Discharge status: Skilled Nursing Facility | Payer: Medicare Other | Attending: Internal Medicine | Admitting: Internal Medicine

## 2015-04-26 ENCOUNTER — Inpatient Hospital Stay (HOSPITAL_COMMUNITY): Payer: Medicare Other | Admitting: Registered Nurse

## 2015-04-26 ENCOUNTER — Encounter (HOSPITAL_COMMUNITY): Payer: Self-pay | Admitting: Emergency Medicine

## 2015-04-26 ENCOUNTER — Inpatient Hospital Stay (HOSPITAL_COMMUNITY): Payer: Medicare Other

## 2015-04-26 DIAGNOSIS — Z8711 Personal history of peptic ulcer disease: Secondary | ICD-10-CM | POA: Diagnosis not present

## 2015-04-26 DIAGNOSIS — D62 Acute posthemorrhagic anemia: Secondary | ICD-10-CM | POA: Diagnosis not present

## 2015-04-26 DIAGNOSIS — S42302A Unspecified fracture of shaft of humerus, left arm, initial encounter for closed fracture: Secondary | ICD-10-CM | POA: Diagnosis present

## 2015-04-26 DIAGNOSIS — J189 Pneumonia, unspecified organism: Secondary | ICD-10-CM

## 2015-04-26 DIAGNOSIS — I952 Hypotension due to drugs: Secondary | ICD-10-CM | POA: Diagnosis not present

## 2015-04-26 DIAGNOSIS — Z7982 Long term (current) use of aspirin: Secondary | ICD-10-CM

## 2015-04-26 DIAGNOSIS — S42201A Unspecified fracture of upper end of right humerus, initial encounter for closed fracture: Secondary | ICD-10-CM

## 2015-04-26 DIAGNOSIS — J9610 Chronic respiratory failure, unspecified whether with hypoxia or hypercapnia: Secondary | ICD-10-CM | POA: Diagnosis present

## 2015-04-26 DIAGNOSIS — M797 Fibromyalgia: Secondary | ICD-10-CM | POA: Diagnosis present

## 2015-04-26 DIAGNOSIS — Z7952 Long term (current) use of systemic steroids: Secondary | ICD-10-CM | POA: Diagnosis not present

## 2015-04-26 DIAGNOSIS — S62512A Displaced fracture of proximal phalanx of left thumb, initial encounter for closed fracture: Secondary | ICD-10-CM | POA: Diagnosis present

## 2015-04-26 DIAGNOSIS — Z86010 Personal history of colonic polyps: Secondary | ICD-10-CM | POA: Diagnosis not present

## 2015-04-26 DIAGNOSIS — Z803 Family history of malignant neoplasm of breast: Secondary | ICD-10-CM

## 2015-04-26 DIAGNOSIS — W19XXXA Unspecified fall, initial encounter: Secondary | ICD-10-CM

## 2015-04-26 DIAGNOSIS — M810 Age-related osteoporosis without current pathological fracture: Secondary | ICD-10-CM | POA: Diagnosis present

## 2015-04-26 DIAGNOSIS — J449 Chronic obstructive pulmonary disease, unspecified: Secondary | ICD-10-CM | POA: Diagnosis present

## 2015-04-26 DIAGNOSIS — I959 Hypotension, unspecified: Secondary | ICD-10-CM | POA: Diagnosis not present

## 2015-04-26 DIAGNOSIS — S42212A Unspecified displaced fracture of surgical neck of left humerus, initial encounter for closed fracture: Secondary | ICD-10-CM | POA: Diagnosis present

## 2015-04-26 DIAGNOSIS — D638 Anemia in other chronic diseases classified elsewhere: Secondary | ICD-10-CM | POA: Diagnosis not present

## 2015-04-26 DIAGNOSIS — Z7951 Long term (current) use of inhaled steroids: Secondary | ICD-10-CM

## 2015-04-26 DIAGNOSIS — M25512 Pain in left shoulder: Secondary | ICD-10-CM | POA: Diagnosis present

## 2015-04-26 DIAGNOSIS — Z9981 Dependence on supplemental oxygen: Secondary | ICD-10-CM

## 2015-04-26 DIAGNOSIS — I10 Essential (primary) hypertension: Secondary | ICD-10-CM | POA: Diagnosis present

## 2015-04-26 DIAGNOSIS — S42202A Unspecified fracture of upper end of left humerus, initial encounter for closed fracture: Secondary | ICD-10-CM | POA: Diagnosis not present

## 2015-04-26 DIAGNOSIS — E876 Hypokalemia: Secondary | ICD-10-CM | POA: Diagnosis present

## 2015-04-26 DIAGNOSIS — I5032 Chronic diastolic (congestive) heart failure: Secondary | ICD-10-CM | POA: Diagnosis present

## 2015-04-26 DIAGNOSIS — Y92002 Bathroom of unspecified non-institutional (private) residence single-family (private) house as the place of occurrence of the external cause: Secondary | ICD-10-CM

## 2015-04-26 DIAGNOSIS — J42 Unspecified chronic bronchitis: Secondary | ICD-10-CM | POA: Diagnosis not present

## 2015-04-26 DIAGNOSIS — W010XXA Fall on same level from slipping, tripping and stumbling without subsequent striking against object, initial encounter: Secondary | ICD-10-CM | POA: Diagnosis present

## 2015-04-26 DIAGNOSIS — D510 Vitamin B12 deficiency anemia due to intrinsic factor deficiency: Secondary | ICD-10-CM | POA: Diagnosis present

## 2015-04-26 DIAGNOSIS — Z85028 Personal history of other malignant neoplasm of stomach: Secondary | ICD-10-CM | POA: Diagnosis not present

## 2015-04-26 DIAGNOSIS — S42309A Unspecified fracture of shaft of humerus, unspecified arm, initial encounter for closed fracture: Secondary | ICD-10-CM | POA: Diagnosis present

## 2015-04-26 DIAGNOSIS — Z87891 Personal history of nicotine dependence: Secondary | ICD-10-CM

## 2015-04-26 DIAGNOSIS — K449 Diaphragmatic hernia without obstruction or gangrene: Secondary | ICD-10-CM | POA: Diagnosis present

## 2015-04-26 DIAGNOSIS — S42209A Unspecified fracture of upper end of unspecified humerus, initial encounter for closed fracture: Secondary | ICD-10-CM | POA: Diagnosis present

## 2015-04-26 DIAGNOSIS — R52 Pain, unspecified: Secondary | ICD-10-CM

## 2015-04-26 DIAGNOSIS — S62502A Fracture of unspecified phalanx of left thumb, initial encounter for closed fracture: Secondary | ICD-10-CM | POA: Diagnosis not present

## 2015-04-26 DIAGNOSIS — K219 Gastro-esophageal reflux disease without esophagitis: Secondary | ICD-10-CM | POA: Diagnosis present

## 2015-04-26 DIAGNOSIS — S62502S Fracture of unspecified phalanx of left thumb, sequela: Secondary | ICD-10-CM | POA: Diagnosis not present

## 2015-04-26 DIAGNOSIS — J439 Emphysema, unspecified: Secondary | ICD-10-CM | POA: Diagnosis not present

## 2015-04-26 DIAGNOSIS — S43006A Unspecified dislocation of unspecified shoulder joint, initial encounter: Secondary | ICD-10-CM

## 2015-04-26 HISTORY — PX: SHOULDER CLOSED REDUCTION: SHX1051

## 2015-04-26 HISTORY — DX: Unspecified fracture of shaft of humerus, left arm, initial encounter for closed fracture: S42.302A

## 2015-04-26 LAB — SURGICAL PCR SCREEN
MRSA, PCR: NEGATIVE
Staphylococcus aureus: POSITIVE — AB

## 2015-04-26 LAB — CBC
HCT: 32.2 % — ABNORMAL LOW (ref 36.0–46.0)
Hemoglobin: 9.7 g/dL — ABNORMAL LOW (ref 12.0–15.0)
MCH: 26.9 pg (ref 26.0–34.0)
MCHC: 30.1 g/dL (ref 30.0–36.0)
MCV: 89.2 fL (ref 78.0–100.0)
Platelets: 343 10*3/uL (ref 150–400)
RBC: 3.61 MIL/uL — ABNORMAL LOW (ref 3.87–5.11)
RDW: 15.4 % (ref 11.5–15.5)
WBC: 11.9 10*3/uL — AB (ref 4.0–10.5)

## 2015-04-26 LAB — BASIC METABOLIC PANEL
Anion gap: 11 (ref 5–15)
BUN: 15 mg/dL (ref 6–20)
CO2: 27 mmol/L (ref 22–32)
Calcium: 9.2 mg/dL (ref 8.9–10.3)
Chloride: 104 mmol/L (ref 101–111)
Creatinine, Ser: 0.75 mg/dL (ref 0.44–1.00)
GFR calc Af Amer: >60 mL/min (ref >60)
GFR calc non Af Amer: >60 mL/min (ref >60)
GLUCOSE: 121 mg/dL — AB (ref 65–99)
Potassium: 3 mmol/L — ABNORMAL LOW (ref 3.5–5.1)
SODIUM: 142 mmol/L (ref 135–145)

## 2015-04-26 SURGERY — MANIPULATION, JOINT, SHOULDER, WITH ANESTHESIA
Anesthesia: General | Laterality: Left

## 2015-04-26 MED ORDER — TIZANIDINE HCL 2 MG PO TABS
2.0000 mg | ORAL_TABLET | Freq: Three times a day (TID) | ORAL | Status: DC | PRN
  Administered 2015-04-26: 4 mg via ORAL
  Administered 2015-04-27: 2 mg via ORAL
  Administered 2015-04-27: 4 mg via ORAL
  Administered 2015-04-28 – 2015-04-30 (×5): 2 mg via ORAL
  Filled 2015-04-26 (×9): qty 2

## 2015-04-26 MED ORDER — LORATADINE 10 MG PO TABS
10.0000 mg | ORAL_TABLET | Freq: Every morning | ORAL | Status: DC
  Administered 2015-04-27 – 2015-04-30 (×4): 10 mg via ORAL
  Filled 2015-04-26 (×4): qty 1

## 2015-04-26 MED ORDER — ALBUTEROL SULFATE (2.5 MG/3ML) 0.083% IN NEBU
5.0000 mg | INHALATION_SOLUTION | Freq: Once | RESPIRATORY_TRACT | Status: AC
  Administered 2015-04-26: 5 mg via RESPIRATORY_TRACT
  Filled 2015-04-26: qty 6

## 2015-04-26 MED ORDER — SODIUM CHLORIDE 0.9 % IV BOLUS (SEPSIS)
500.0000 mL | Freq: Once | INTRAVENOUS | Status: AC
  Administered 2015-04-26: 500 mL via INTRAVENOUS

## 2015-04-26 MED ORDER — FENTANYL CITRATE (PF) 100 MCG/2ML IJ SOLN: 25.0000 ug | INTRAMUSCULAR | Status: DC | PRN

## 2015-04-26 MED ORDER — LACTATED RINGERS IV SOLN
INTRAVENOUS | Status: DC | PRN
  Administered 2015-04-26: via INTRAVENOUS

## 2015-04-26 MED ORDER — CLONIDINE HCL (ANALGESIA) 100 MCG/ML EP SOLN
EPIDURAL | Status: AC
  Filled 2015-04-26: qty 10

## 2015-04-26 MED ORDER — POTASSIUM CHLORIDE CRYS ER 20 MEQ PO TBCR
40.0000 meq | EXTENDED_RELEASE_TABLET | Freq: Once | ORAL | Status: AC
  Administered 2015-04-26: 40 meq via ORAL
  Filled 2015-04-26: qty 2

## 2015-04-26 MED ORDER — ONDANSETRON HCL 4 MG/2ML IJ SOLN
INTRAMUSCULAR | Status: AC
  Filled 2015-04-26: qty 2

## 2015-04-26 MED ORDER — ENOXAPARIN SODIUM 40 MG/0.4ML ~~LOC~~ SOLN
40.0000 mg | SUBCUTANEOUS | Status: DC
  Filled 2015-04-26 (×2): qty 0.4

## 2015-04-26 MED ORDER — LIDOCAINE HCL (CARDIAC) 20 MG/ML IV SOLN
INTRAVENOUS | Status: AC
  Filled 2015-04-26: qty 5

## 2015-04-26 MED ORDER — ONDANSETRON HCL 4 MG/2ML IJ SOLN: 4.0000 mg | Freq: Three times a day (TID) | INTRAMUSCULAR | Status: DC | PRN

## 2015-04-26 MED ORDER — PREDNISONE 1 MG PO TABS
2.0000 mg | ORAL_TABLET | Freq: Every day | ORAL | Status: DC
  Administered 2015-04-27 – 2015-04-30 (×4): 2 mg via ORAL
  Filled 2015-04-26 (×6): qty 2

## 2015-04-26 MED ORDER — LORAZEPAM 2 MG/ML IJ SOLN
0.5000 mg | Freq: Once | INTRAMUSCULAR | Status: AC
  Administered 2015-04-26: 0.5 mg via INTRAVENOUS
  Filled 2015-04-26: qty 1

## 2015-04-26 MED ORDER — POTASSIUM CHLORIDE 10 MEQ/100ML IV SOLN
10.0000 meq | INTRAVENOUS | Status: AC
  Administered 2015-04-26 (×2): 10 meq via INTRAVENOUS
  Filled 2015-04-26 (×2): qty 100

## 2015-04-26 MED ORDER — ACETAMINOPHEN 500 MG PO TABS
1000.0000 mg | ORAL_TABLET | Freq: Once | ORAL | Status: AC
  Administered 2015-04-26: 1000 mg via ORAL
  Filled 2015-04-26: qty 2

## 2015-04-26 MED ORDER — HYPROMELLOSE (GONIOSCOPIC) 2.5 % OP SOLN: 1.0000 [drp] | Freq: Three times a day (TID) | OPHTHALMIC | Status: DC | PRN

## 2015-04-26 MED ORDER — FENTANYL CITRATE (PF) 100 MCG/2ML IJ SOLN
INTRAMUSCULAR | Status: AC
  Filled 2015-04-26: qty 2

## 2015-04-26 MED ORDER — ATENOLOL 50 MG PO TABS
50.0000 mg | ORAL_TABLET | Freq: Two times a day (BID) | ORAL | Status: DC
  Administered 2015-04-26 – 2015-04-28 (×4): 50 mg via ORAL
  Filled 2015-04-26 (×7): qty 1

## 2015-04-26 MED ORDER — HYDROMORPHONE HCL 1 MG/ML IJ SOLN
0.5000 mg | INTRAMUSCULAR | Status: DC | PRN
  Administered 2015-04-26 – 2015-04-27 (×2): 0.5 mg via INTRAVENOUS
  Filled 2015-04-26 (×2): qty 1

## 2015-04-26 MED ORDER — SODIUM CHLORIDE 0.9 % IV SOLN
INTRAVENOUS | Status: DC
  Administered 2015-04-26: 17:00:00 via INTRAVENOUS

## 2015-04-26 MED ORDER — ONDANSETRON HCL 4 MG PO TABS: 4.0000 mg | ORAL_TABLET | Freq: Four times a day (QID) | ORAL | Status: DC | PRN

## 2015-04-26 MED ORDER — MORPHINE SULFATE 4 MG/ML IJ SOLN
INTRAMUSCULAR | Status: AC
  Filled 2015-04-26: qty 1

## 2015-04-26 MED ORDER — DIPHENHYDRAMINE HCL 50 MG/ML IJ SOLN
25.0000 mg | Freq: Four times a day (QID) | INTRAMUSCULAR | Status: DC | PRN
  Administered 2015-04-26 – 2015-04-30 (×9): 25 mg via INTRAVENOUS
  Filled 2015-04-26 (×11): qty 1

## 2015-04-26 MED ORDER — FLUTICASONE FUROATE-VILANTEROL 200-25 MCG/INH IN AEPB: 2.0000 | INHALATION_SPRAY | Freq: Every morning | RESPIRATORY_TRACT | Status: DC

## 2015-04-26 MED ORDER — FUROSEMIDE 20 MG PO TABS
20.0000 mg | ORAL_TABLET | Freq: Every morning | ORAL | Status: DC
  Administered 2015-04-27 – 2015-04-28 (×2): 20 mg via ORAL
  Filled 2015-04-26 (×3): qty 1

## 2015-04-26 MED ORDER — LORAZEPAM 0.5 MG PO TABS
0.5000 mg | ORAL_TABLET | Freq: Three times a day (TID) | ORAL | Status: DC | PRN
  Administered 2015-04-27 – 2015-04-30 (×6): 0.5 mg via ORAL
  Filled 2015-04-26 (×8): qty 1

## 2015-04-26 MED ORDER — PROPOFOL 10 MG/ML IV BOLUS
INTRAVENOUS | Status: AC
  Filled 2015-04-26: qty 20

## 2015-04-26 MED ORDER — GUAIFENESIN ER 600 MG PO TB12
600.0000 mg | ORAL_TABLET | Freq: Two times a day (BID) | ORAL | Status: DC
  Administered 2015-04-27 – 2015-04-30 (×7): 600 mg via ORAL
  Filled 2015-04-26 (×9): qty 1

## 2015-04-26 MED ORDER — SODIUM CHLORIDE 0.9 % IV SOLN: INTRAVENOUS | Status: DC

## 2015-04-26 MED ORDER — ONDANSETRON HCL 4 MG/2ML IJ SOLN: 4.0000 mg | Freq: Four times a day (QID) | INTRAMUSCULAR | Status: DC | PRN

## 2015-04-26 MED ORDER — IPRATROPIUM-ALBUTEROL 0.5-2.5 (3) MG/3ML IN SOLN
3.0000 mL | Freq: Two times a day (BID) | RESPIRATORY_TRACT | Status: DC
  Administered 2015-04-26 – 2015-04-28 (×4): 3 mL via RESPIRATORY_TRACT
  Filled 2015-04-26 (×4): qty 3

## 2015-04-26 MED ORDER — DIAZEPAM 5 MG/ML IJ SOLN
5.0000 mg | Freq: Three times a day (TID) | INTRAMUSCULAR | Status: DC | PRN
  Administered 2015-04-26 – 2015-04-29 (×4): 5 mg via INTRAVENOUS
  Filled 2015-04-26 (×4): qty 2

## 2015-04-26 MED ORDER — AMLODIPINE BESYLATE 2.5 MG PO TABS
2.5000 mg | ORAL_TABLET | Freq: Every morning | ORAL | Status: DC
  Administered 2015-04-27 – 2015-04-30 (×3): 2.5 mg via ORAL
  Filled 2015-04-26 (×4): qty 1

## 2015-04-26 MED ORDER — ASPIRIN EC 81 MG PO TBEC
81.0000 mg | DELAYED_RELEASE_TABLET | Freq: Every morning | ORAL | Status: DC
  Administered 2015-04-27 – 2015-04-30 (×4): 81 mg via ORAL
  Filled 2015-04-26 (×4): qty 1

## 2015-04-26 MED ORDER — POLYVINYL ALCOHOL 1.4 % OP SOLN: 1.0000 [drp] | Freq: Three times a day (TID) | OPHTHALMIC | Status: DC | PRN

## 2015-04-26 MED ORDER — LISINOPRIL 40 MG PO TABS
40.0000 mg | ORAL_TABLET | Freq: Every evening | ORAL | Status: DC
  Administered 2015-04-26 – 2015-04-28 (×2): 40 mg via ORAL
  Filled 2015-04-26 (×5): qty 1

## 2015-04-26 MED ORDER — ACETAMINOPHEN 500 MG PO TABS: 1000.0000 mg | ORAL_TABLET | Freq: Four times a day (QID) | ORAL | Status: DC | PRN

## 2015-04-26 MED ORDER — KETOROLAC TROMETHAMINE 15 MG/ML IJ SOLN
15.0000 mg | Freq: Four times a day (QID) | INTRAMUSCULAR | Status: DC | PRN
  Administered 2015-04-26 – 2015-04-30 (×10): 15 mg via INTRAVENOUS
  Filled 2015-04-26 (×10): qty 1

## 2015-04-26 MED ORDER — PANTOPRAZOLE SODIUM 40 MG IV SOLR
40.0000 mg | INTRAVENOUS | Status: DC
  Administered 2015-04-26: 40 mg via INTRAVENOUS
  Filled 2015-04-26 (×2): qty 40

## 2015-04-26 MED ORDER — BUPIVACAINE HCL (PF) 0.25 % IJ SOLN
INTRAMUSCULAR | Status: AC
  Filled 2015-04-26: qty 30

## 2015-04-26 SURGICAL SUPPLY — 1 items: SLING ARM IMMOBILIZER LRG (SOFTGOODS) ×8 IMPLANT

## 2015-04-26 NOTE — ED Notes (Signed)
Bed: VX79 Expected date: 04/26/15 Expected time: 11:32 AM Means of arrival: Ambulance Comments: Fall deformity left shoulder

## 2015-04-26 NOTE — H&P (Signed)
PCP:   [place], Malka So, MD   Chief Complaint:  Fall  HPI:  81 year old female who  has a past medical history of Pernicious anemia; Osteoarthritis; Fibromyalgia; Skin lesion; HX of multiple bleeding ulcers (09/01/2011); Hypertension; Emphysema; Bronchitis; Adenocarcinoma of gastroesophageal junction; Dizziness; Back pain; Osteoporosis; Scoliosis; Psoriasis; H/O hiatal hernia; GERD (gastroesophageal reflux disease); Gastric ulcer; Constipation; Diverticulosis; History of colonic polyps; Shingles; Kidney stone (25+yrs ago); Pernicious anemia; Vitamin B deficiency; Complication of anesthesia; COPD (chronic obstructive pulmonary disease); Shortness of breath; and Blood transfusion. Today presents to the hospital after patient had a mechanical fall at home falling on the left shoulder. Patient denies any loss of consciousness she says that she was resting to go to bathroom and slipped on the floor. There is no chest pain no shortness of breath no nausea vomiting or diarrhea. She denies chest pain but has gone shortness of breath. Patient has COPD and uses home oxygen. Patient complained of left shoulder pain and was brought to the ED. In the ED x-ray of the shoulder showed comminuted left proximal humerus fracture. Orthopedics was consulted by the ED physician and also recommended to get a CT of the shoulder.  Allergies:   Allergies  Allergen Reactions  . Keflex [Cephalexin] Itching    "severe itching"  . Nulecit [Na Ferric Gluc Cplx In Sucrose] Diarrhea, Nausea Only and Other (See Comments)      drop in BP  . Biaxin [Clarithromycin] Itching  . Fentanyl Itching  . Spiriva Handihaler [Tiotropium Bromide Monohydrate] Other (See Comments)    constipation  . Tramadol Itching    Skin burning and itching  . Adhesive [Tape] Itching  . Clarithromycin Itching  . Darvon Itching  . Epinephrine Itching  . Morphine And Related Itching  . Oxycodone Itching  . Sulfa Antibiotics Itching  . Vicodin  [Hydrocodone-Acetaminophen] Itching    Can take with Benadryl      Past Medical History  Diagnosis Date  . Pernicious anemia   . Osteoarthritis   . Fibromyalgia   . Skin lesion     chronic calcific cutaneous lesions  . HX of multiple bleeding ulcers 09/01/2011  . Hypertension     takes Atenolol daily  . Emphysema   . Bronchitis     hx  . Adenocarcinoma of gastroesophageal junction   . Dizziness     d/t crystals in ears that "roll around"and can't get them out  . Back pain   . Osteoporosis   . Scoliosis   . Psoriasis   . H/O hiatal hernia   . GERD (gastroesophageal reflux disease)     takes Aciphex daily  . Gastric ulcer   . Constipation     since Chemo and Radiation;finished up in Nov 2012  . Diverticulosis   . History of colonic polyps   . Shingles     broke out 2wks ago;has been on meds and to finish up tomorrow  . Kidney stone 25+yrs ago  . Pernicious anemia   . Vitamin B deficiency   . Complication of anesthesia     shortness of breath and pain with gas   . COPD (chronic obstructive pulmonary disease)   . Shortness of breath     with exertion   . Blood transfusion     Past Surgical History  Procedure Laterality Date  . Right hip replacement  2001  . Tonsillectomy      as a child  . Tubal ligation  1975  . Appendectomy  1975  .  Cataract surgery  10+yrs ago  . Esophagogastroduodenoscopy    . Colonoscopy    . Extubation  12/20/2011       . Partial esophagectomy  12/20/2011    Procedure: ESOPHAGECTOMY PARTIAL;  Surgeon: Pierre Bali, MD;  Location: [facility];  Service: Thoracic;  Laterality: N/A;  . Total hip arthroplasty  06/30/2012    Procedure: TOTAL HIP ARTHROPLASTY ANTERIOR APPROACH;  Surgeon: Mcarthur Rossetti, MD;  Location: [facility];  Service: Orthopedics;  Laterality: Left;  Left total hip arthroplasty    Prior to Admission medications   Medication Sig Start Date End Date Taking? Authorizing Provider  acetaminophen (TYLENOL) 500 MG tablet Take  1,000 mg by mouth every 6 (six) hours as needed for mild pain or headache.    Yes Historical Provider, MD  albuterol (PROVENTIL HFA;VENTOLIN HFA) 108 (90 BASE) MCG/ACT inhaler Inhale 2 puffs into the lungs every 6 (six) hours as needed for wheezing (wheezing).   Yes Historical Provider, MD  amLODipine (NORVASC) 2.5 MG tablet Take 2.5 mg by mouth every morning.  08/13/14  Yes Historical Provider, MD  aspirin EC 81 MG tablet Take 81 mg by mouth every morning.    Yes Historical Provider, MD  atenolol (TENORMIN) 50 MG tablet Take 50 mg by mouth 2 (two) times daily.   Yes Historical Provider, MD  cyanocobalamin (,VITAMIN B-12,) 1000 MCG/ML injection Inject 1,000 mcg into the muscle every 6 (six) weeks.   Yes Historical Provider, MD  ergocalciferol (VITAMIN D2) 50000 UNITS capsule Take 50,000 Units by mouth every 14 (fourteen) days.    Yes Historical Provider, MD  esomeprazole (NEXIUM) 20 MG capsule Take 20 mg by mouth daily at 12 noon.   Yes Historical Provider, MD  Fluticasone Furoate-Vilanterol 200-25 MCG/INH AEPB Inhale 2 puffs into the lungs every morning.   Yes Historical Provider, MD  furosemide (LASIX) 20 MG tablet Take 20 mg by mouth every morning.  02/07/14  Yes Historical Provider, MD  hydroxypropyl methylcellulose (ISOPTO TEARS) 2.5 % ophthalmic solution Place 1 drop into both eyes 3 (three) times daily as needed for dry eyes.   Yes Historical Provider, MD  ipratropium-albuterol (DUONEB) 0.5-2.5 (3) MG/3ML SOLN Take 3 mLs by nebulization 2 (two) times daily.   Yes Historical Provider, MD  Lactobacillus Rhamnosus, GG, (CULTURELLE) CAPS Take 1 capsule by mouth every morning.   Yes Historical Provider, MD  lisinopril (PRINIVIL,ZESTRIL) 40 MG tablet Take 40 mg by mouth every evening.  08/13/14  Yes Historical Provider, MD  loratadine (CLARITIN) 10 MG tablet Take 10 mg by mouth every morning.    Yes Historical Provider, MD  LORazepam (ATIVAN) 0.5 MG tablet Take 1 tablet (0.5 mg total) by mouth every 8  (eight) hours as needed for anxiety. 05/13/14  Yes Estela Leonie Green, MD  predniSONE (DELTASONE) 1 MG tablet Take 2 mg by mouth daily with breakfast.   Yes Historical Provider, MD  saccharomyces boulardii (FLORASTOR) 250 MG capsule Take 1 capsule (250 mg total) by mouth 2 (two) times daily. 05/02/14  Yes Modena Jansky, MD  sodium chloride (OCEAN) 0.65 % SOLN nasal spray Place 1 spray into both nostrils as needed for congestion. 05/02/14  Yes Modena Jansky, MD  [facility]ORT PO Take 1 capsule by mouth 2 (two) times daily.   Yes Historical Provider, MD  tiZANidine (ZANAFLEX) 4 MG tablet Take 2-4 mg by mouth every 8 (eight) hours as needed for muscle spasms.    Yes Historical Provider, MD  ibuprofen (  ADVIL,MOTRIN) 400 MG tablet Take 1 tablet (400 mg total) by mouth every 8 (eight) hours as needed (pain). Patient not taking: Reported on 04/26/2015 05/13/14   Erline Hau, MD  predniSONE (STERAPRED UNI-PAK) 10 MG tablet Take by mouth daily. Take 40 mg po daily for the next 3 days, then take 30 mg po daily for the next 3 days, then take 20 mg po daily for the next 3 days, then take 10 mg po daily for the next 3 days. Then continue your home regimen of 2 mg po daily Patient not taking: Reported on 04/26/2015 07/14/14   Velvet Bathe, MD  white petrolatum (VASELINE) GEL Apply 1 application topically as needed (anterior nares to prevent nose bleed). Patient not taking: Reported on 04/26/2015 05/02/14   Modena Jansky, MD    Social History:  reports that she quit smoking about 23 years ago. Her smoking use included Cigarettes. She smoked 3.00 packs per day. She has never used smokeless tobacco. She reports that she does not drink alcohol or use illicit drugs.  Family History  Problem Relation Age of Onset  . Cancer Maternal Grandmother     gynecologic  . Cancer Maternal Aunt     gynecologic  . Breast cancer Paternal Aunt   . Breast cancer Paternal Aunt   . Breast cancer Paternal Aunt     . Anesthesia problems Neg Hx   . Hypotension Neg Hx   . Malignant hyperthermia Neg Hx   . Pseudochol deficiency Neg Hx      All the positives are listed in BOLD  Review of Systems:  HEENT: Headache, blurred vision, runny nose, sore throat Neck: Hypothyroidism, hyperthyroidism,,lymphadenopathy Chest : Shortness of breath, history of COPD, Asthma Heart : Chest pain, history of coronary arterey disease GI:  Nausea, vomiting, diarrhea, constipation, GERD GU: Dysuria, urgency, frequency of urination, hematuria Neuro: Stroke, seizures, syncope Psych: Depression, anxiety, hallucinations   Physical Exam: Blood pressure 168/68, pulse 76, temperature 98.8 F (37.1 C), temperature source Oral, resp. rate 16, SpO2 95 %. Constitutional:   Patient is a well-developed and well-nourished *female in no acute distress and cooperative with exam. Head: Normocephalic and atraumatic Mouth: Mucus membranes moist Eyes: PERRL, EOMI, conjunctivae normal Neck: Supple, No Thyromegaly Cardiovascular: RRR, S1 normal, S2 normal Pulmonary/Chest: CTAB, no wheezes, rales, or rhonchi Abdominal: Soft. Non-tender, non-distended, bowel sounds are normal, no masses, organomegaly, or guarding present.  Neurological: A&O x3, Strength is normal and symmetric bilaterally, cranial nerve II-XII are grossly intact, no focal motor deficit, sensory intact to light touch bilaterally.  Extremities : Left shoulder in splint  Labs on Admission:  Basic Metabolic Panel:  Recent Labs Lab 04/26/15 1355  NA 142  K 3.0*  CL 104  CO2 27  GLUCOSE 121*  BUN 15  CREATININE 0.75  CALCIUM 9.2   Liver Function Tests: No results for input(s): AST, ALT, ALKPHOS, BILITOT, PROT, ALBUMIN in the last 168 hours. No results for input(s): LIPASE, AMYLASE in the last 168 hours. No results for input(s): AMMONIA in the last 168 hours. CBC:  Recent Labs Lab 04/26/15 1355  WBC 11.9*  HGB 9.7*  HCT 32.2*  MCV 89.2  PLT 343    Cardiac Enzymes: No results for input(s): CKTOTAL, CKMB, CKMBINDEX, TROPONINI in the last 168 hours.  BNP (last 3 results) No results for input(s): BNP in the last 8760 hours.  ProBNP (last 3 results)  Recent Labs  05/06/14 2345 07/13/14 0457  PROBNP 5856.0* 3079.0*  CBG: No results for input(s): GLUCAP in the last 168 hours.  Radiological Exams on Admission: Dg Chest 1 View  04/26/2015   CLINICAL DATA:  Patient slipped and fell in the bathroom at home, injuring the left shoulder. Initial encounter.  EXAM: CHEST  1 VIEW  COMPARISON:  01/08/2015 and earlier.  FINDINGS: Cardiac silhouette upper normal in size, unchanged. Thoracic aorta atherosclerotic, unchanged. Right perihilar oval-shaped opacity, shown on prior CT to represent focal dilation of the intralobar pulmonary artery. Hilar and mediastinal contours otherwise unremarkable. Mildly prominent bronchovascular markings diffusely, unchanged. Minimal linear scarring in the left lung base, unchanged. No new pulmonary parenchymal abnormalities. No visible pleural effusions.  IMPRESSION: No acute cardiopulmonary disease.   Electronically Signed   By: Evangeline Dakin M.D.   On: 04/26/2015 12:56   Dg Shoulder Left  04/26/2015   CLINICAL DATA:  Patient status post fall with left shoulder pain. History of cancer.  EXAM: LEFT SHOULDER - 2+ VIEW  COMPARISON:  Chest radiograph 04/26/2015; 07/25/2014  FINDINGS: There is a markedly comminuted fracture of the proximal left humerus involving the left humeral head. The humeral head appears to be located on limited views however the proximal left humeral diaphysis appears to be medially dislocated. There is contour abnormality of the mid aspect of the left clavicle which is likely secondary to old healed clavicle fracture. Kyphoplasty material within the visualized thoracic spine. Multiple soft tissue calcifications.  IMPRESSION: Comminuted proximal left humerus fracture. The proximal humeral  diaphysis appears to be dislocated medially to the humeral head.  Likely chronic deformity of the mid body of the left clavicle, potentially secondary to old healed fracture. Recommend correlation with dedicated clavicle views.   Electronically Signed   By: Lovey Newcomer M.D.   On: 04/26/2015 12:53     Assessment/Plan Active Problems:   Left humeral fracture   Humerus fracture   Hypokalemia  Left humeral fracture Will admit the patient for pain control, start Toradol 15 mg every 6 hours when necessary as patient cannot take opiates. Orthopedics has been consulted and will be seeing the patient shortly.  Hypokalemia Replace potassium and check BMP in a.m.  COPD Stable, continue duo nebs twice a day . Start Mucinex 1 tablet by mouth twice a day   History of diastolic heart failure  Continue by mouth Lasix   GERD  Start IV Protonix   CODE STATUS -full code   Family discussion: Admission, patients condition and plan of care including tests being ordered have been discussed with the patient and *her son at bedside who indicate understanding and agree with the plan and Code Status.   Time Spent on Admission: 55 minutes  [facility] Pager: [phone] 04/26/2015, 3:22 PM  If 7PM-7AM, please contact night-coverage  [email or web address]  Password [email or web address]

## 2015-04-26 NOTE — Anesthesia Preprocedure Evaluation (Addendum)
Anesthesia Evaluation  Patient identified by MRN, date of birth, ID band Patient awake    Reviewed: Allergy & Precautions, NPO status , Patient's Chart, lab work & pertinent test results  History of Anesthesia Complications (+) history of anesthetic complications  Airway Mallampati: II  TM Distance: >3 FB Neck ROM: Full    Dental no notable dental hx.    Pulmonary shortness of breath and with exertion, pneumonia -, resolved, COPD COPD inhaler and oxygen dependent, former smoker,  Appears SOB and has productive cough. She states this cough is normal for her. breath sounds clear to auscultation  Pulmonary exam normal       Cardiovascular hypertension, Pt. on medications and Pt. on home beta blockers +CHF Normal cardiovascular examRhythm:Regular Rate:Normal  ECHO: EF 50-55%   Neuro/Psych  Neuromuscular disease negative psych ROS   GI/Hepatic Neg liver ROS, hiatal hernia, PUD, GERD-  Medicated,  Endo/Other  negative endocrine ROS  Renal/GU Renal disease  negative genitourinary   Musculoskeletal  (+) Arthritis -, Fibromyalgia -  Abdominal   Peds negative pediatric ROS (+)  Hematology  (+) anemia ,   Anesthesia Other Findings   Reproductive/Obstetrics negative OB ROS                       Anesthesia Physical Anesthesia Plan  ASA: III  Anesthesia Plan: General   Post-op Pain Management:    Induction: Intravenous  Airway Management Planned: Oral ETT  Additional Equipment:   Intra-op Plan:   Post-operative Plan: Extubation in OR  Informed Consent: I have reviewed the patients History and Physical, chart, labs and discussed the procedure including the risks, benefits and alternatives for the proposed anesthesia with the patient or authorized representative who has indicated his/her understanding and acceptance.   Dental advisory given  Plan Discussed with: CRNA  Anesthesia Plan  Comments: (Believe she would get too SOB with block of phrenic nerve.)       Anesthesia Quick Evaluation

## 2015-04-26 NOTE — ED Notes (Signed)
Pt to x-ray and returned to room without distress noted. 

## 2015-04-26 NOTE — ED Notes (Signed)
MD at bedside. 

## 2015-04-26 NOTE — ED Notes (Signed)
Pt slipped while rushing to the restroom at 1045 today. No LOC, not on blood thinners.. Pt has L shoulder deformity, able to move fingers, good cap refill. Pt also has abrasion under her glasses.

## 2015-04-26 NOTE — ED Notes (Signed)
Patient transported to CT 

## 2015-04-26 NOTE — ED Provider Notes (Signed)
CSN: [id number]     Arrival date & time 04/26/15  1133 History   First MD Initiated Contact with Patient 04/26/15 1159     Chief Complaint  Patient presents with  . Shoulder Injury  . Fall     (Consider location/radiation/quality/duration/timing/severity/associated sxs/prior Treatment) HPI Comments: 81 year old female with pernicious anemia, fibromyalgia, bleeding ulcers, pneumonia, COPD presents with left shoulder pain and deformity since falling at 1045 today. Patient was resting to go to the bathroom and slipped on the floor. Majority the impact his left shoulder, mild impact to her nose with mild bleeding initially that is controlled. No loss of consciousness, no blood thinners. Patient feels well otherwise, chronic cough similar to her previous COPD, no fevers. Pain with any range of motion left shoulder. Past smoker  Patient is a 81 y.o. female presenting with shoulder injury and fall. The history is provided by the patient.  Shoulder Injury Pertinent negatives include no chest pain, no abdominal pain, no headaches and no shortness of breath.  Fall Pertinent negatives include no chest pain, no abdominal pain, no headaches and no shortness of breath.    Past Medical History  Diagnosis Date  . Pernicious anemia   . Osteoarthritis   . Fibromyalgia   . Skin lesion     chronic calcific cutaneous lesions  . HX of multiple bleeding ulcers 09/01/2011  . Hypertension     takes Atenolol daily  . Emphysema   . Bronchitis     hx  . Adenocarcinoma of gastroesophageal junction   . Dizziness     d/t crystals in ears that "roll around"and can't get them out  . Back pain   . Osteoporosis   . Scoliosis   . Psoriasis   . H/O hiatal hernia   . GERD (gastroesophageal reflux disease)     takes Aciphex daily  . Gastric ulcer   . Constipation     since Chemo and Radiation;finished up in Nov 2012  . Diverticulosis   . History of colonic polyps   . Shingles     broke out 2wks ago;has been  on meds and to finish up tomorrow  . Kidney stone 25+yrs ago  . Pernicious anemia   . Vitamin B deficiency   . Complication of anesthesia     shortness of breath and pain with gas   . COPD (chronic obstructive pulmonary disease)   . Shortness of breath     with exertion   . Blood transfusion    Past Surgical History  Procedure Laterality Date  . Right hip replacement  2001  . Tonsillectomy      as a child  . Tubal ligation  1975  . Appendectomy  1975  . Cataract surgery  10+yrs ago  . Esophagogastroduodenoscopy    . Colonoscopy    . Extubation  12/20/2011       . Partial esophagectomy  12/20/2011    Procedure: ESOPHAGECTOMY PARTIAL;  Surgeon: Pierre Bali, MD;  Location: [facility];  Service: Thoracic;  Laterality: N/A;  . Total hip arthroplasty  06/30/2012    Procedure: TOTAL HIP ARTHROPLASTY ANTERIOR APPROACH;  Surgeon: Mcarthur Rossetti, MD;  Location: [facility];  Service: Orthopedics;  Laterality: Left;  Left total hip arthroplasty   Family History  Problem Relation Age of Onset  . Cancer Maternal Grandmother     gynecologic  . Cancer Maternal Aunt     gynecologic  . Breast cancer Paternal Aunt   . Breast cancer Paternal Aunt   .  Breast cancer Paternal Aunt   . Anesthesia problems Neg Hx   . Hypotension Neg Hx   . Malignant hyperthermia Neg Hx   . Pseudochol deficiency Neg Hx    History  Substance Use Topics  . Smoking status: Former Smoker -- 3.00 packs/day    Types: Cigarettes    Quit date: 12/07/1991  . Smokeless tobacco: Never Used  . Alcohol Use: No   OB History    No data available     Review of Systems  Constitutional: Negative for fever and chills.  HENT: Negative for congestion.   Eyes: Negative for visual disturbance.  Respiratory: Negative for shortness of breath.   Cardiovascular: Negative for chest pain.  Gastrointestinal: Negative for vomiting and abdominal pain.  Genitourinary: Negative for dysuria and flank pain.  Musculoskeletal:  Positive for joint swelling. Negative for back pain, neck pain and neck stiffness.  Skin: Negative for rash.  Neurological: Negative for weakness, light-headedness and headaches.      Allergies  Keflex; Nulecit; Biaxin; Fentanyl; Spiriva handihaler; Tramadol; Adhesive; Clarithromycin; Darvon; Epinephrine; Morphine and related; Oxycodone; Sulfa antibiotics; and Vicodin  Home Medications   Prior to Admission medications   Medication Sig Start Date End Date Taking? Authorizing Provider  acetaminophen (TYLENOL) 500 MG tablet Take 500 mg by mouth every 6 (six) hours as needed for mild pain or headache.    Historical Provider, MD  albuterol (PROVENTIL HFA;VENTOLIN HFA) 108 (90 BASE) MCG/ACT inhaler Inhale 2 puffs into the lungs every 6 (six) hours as needed for wheezing (wheezing).    Historical Provider, MD  amLODipine (NORVASC) 2.5 MG tablet  08/13/14   Historical Provider, MD  aspirin EC 81 MG tablet Take 81 mg by mouth daily.    Historical Provider, MD  atenolol (TENORMIN) 50 MG tablet Take 50 mg by mouth 2 (two) times daily.    Historical Provider, MD  budesonide-formoterol (SYMBICORT) 160-4.5 MCG/ACT inhaler Inhale 2 puffs into the lungs 2 (two) times daily.    Historical Provider, MD  calcium-vitamin D (OSCAL WITH D) 500-200 MG-UNIT per tablet Take 1 tablet by mouth daily.    Historical Provider, MD  ergocalciferol (VITAMIN D2) 50000 UNITS capsule Take 50,000 Units by mouth once a week.    Historical Provider, MD  furosemide (LASIX) 20 MG tablet Take 20 mg by mouth daily.  02/07/14   Historical Provider, MD  hydroxypropyl methylcellulose (ISOPTO TEARS) 2.5 % ophthalmic solution Place 1 drop into both eyes 3 (three) times daily as needed for dry eyes.    Historical Provider, MD  ibuprofen (ADVIL,MOTRIN) 400 MG tablet Take 1 tablet (400 mg total) by mouth every 8 (eight) hours as needed (pain). 05/13/14   Erline Hau, MD  lisinopril (PRINIVIL,ZESTRIL) 40 MG tablet  08/13/14   Historical  Provider, MD  loratadine (CLARITIN) 10 MG tablet Take 10 mg by mouth daily.    Historical Provider, MD  LORazepam (ATIVAN) 0.5 MG tablet Take 1 tablet (0.5 mg total) by mouth every 8 (eight) hours as needed for anxiety. 05/13/14   Erline Hau, MD  omeprazole (PRILOSEC) 20 MG capsule Take 20 mg by mouth daily.     Historical Provider, MD  predniSONE (STERAPRED UNI-PAK) 10 MG tablet Take by mouth daily. Take 40 mg po daily for the next 3 days, then take 30 mg po daily for the next 3 days, then take 20 mg po daily for the next 3 days, then take 10 mg po daily for the next 3 days.  Then continue your home regimen of 2 mg po daily 07/14/14   Velvet Bathe, MD  promethazine (PHENERGAN) 25 MG tablet Take 25 mg by mouth every 6 (six) hours as needed for nausea or vomiting.    Historical Provider, MD  saccharomyces boulardii (FLORASTOR) 250 MG capsule Take 1 capsule (250 mg total) by mouth 2 (two) times daily. 05/02/14   Modena Jansky, MD  sodium chloride (OCEAN) 0.65 % SOLN nasal spray Place 1 spray into both nostrils as needed for congestion. 05/02/14   Modena Jansky, MD  [facility] [place] WORT PO Take 1 capsule by mouth 2 (two) times daily.    Historical Provider, MD  tiZANidine (ZANAFLEX) 4 MG tablet Take 4-8 mg by mouth every 8 (eight) hours as needed for muscle spasms.     Historical Provider, MD  white petrolatum (VASELINE) GEL Apply 1 application topically as needed (anterior nares to prevent nose bleed). 05/02/14   Modena Jansky, MD   BP 168/68 mmHg  Pulse 76  Temp(Src) 98.8 F (37.1 C) (Oral)  Resp 16  SpO2 95% Physical Exam  Constitutional: She is oriented to person, place, and time. She appears well-developed and well-nourished.  HENT:  Head: Normocephalic and atraumatic.  Mild dried blood right near, no hematoma. No step-off. Nose midline.  Eyes: Conjunctivae are normal. Right eye exhibits no discharge. Left eye exhibits no discharge.  Neck: Normal range of motion. Neck supple. No  tracheal deviation present.  Cardiovascular: Normal rate and regular rhythm.   Pulmonary/Chest: Effort normal and breath sounds normal.  Abdominal: Soft. She exhibits no distension. There is no tenderness. There is no guarding.  Musculoskeletal: She exhibits edema and tenderness.  Patient has tenderness to left lateral anterior shoulder and mid humerus with mild deformity. Difficult exam due to pain. Distal pulses intact. No focal wrist or left elbow tenderness or effusion. No midline neck tenderness full range of motion of head and neck  Neurological: She is alert and oriented to person, place, and time. No cranial nerve deficit.  Skin: Skin is warm. No rash noted.  Psychiatric: She has a normal mood and affect.  Nursing note and vitals reviewed.   ED Course  Procedures (including critical care time) Labs Review Labs Reviewed  BASIC METABOLIC PANEL - Abnormal; Notable for the following:    Potassium 3.0 (*)    Glucose, Bld 121 (*)    All other components within normal limits  CBC - Abnormal; Notable for the following:    WBC 11.9 (*)    RBC 3.61 (*)    Hemoglobin 9.7 (*)    HCT 32.2 (*)    All other components within normal limits    Imaging Review Dg Chest 1 View  04/26/2015   CLINICAL DATA:  Patient slipped and fell in the bathroom at home, injuring the left shoulder. Initial encounter.  EXAM: CHEST  1 VIEW  COMPARISON:  01/08/2015 and earlier.  FINDINGS: Cardiac silhouette upper normal in size, unchanged. Thoracic aorta atherosclerotic, unchanged. Right perihilar oval-shaped opacity, shown on prior CT to represent focal dilation of the intralobar pulmonary artery. Hilar and mediastinal contours otherwise unremarkable. Mildly prominent bronchovascular markings diffusely, unchanged. Minimal linear scarring in the left lung base, unchanged. No new pulmonary parenchymal abnormalities. No visible pleural effusions.  IMPRESSION: No acute cardiopulmonary disease.   Electronically Signed    By: Evangeline Dakin M.D.   On: 04/26/2015 12:56   Dg Shoulder Left  04/26/2015   CLINICAL DATA:  Patient status post  fall with left shoulder pain. History of cancer.  EXAM: LEFT SHOULDER - 2+ VIEW  COMPARISON:  Chest radiograph 04/26/2015; 07/25/2014  FINDINGS: There is a markedly comminuted fracture of the proximal left humerus involving the left humeral head. The humeral head appears to be located on limited views however the proximal left humeral diaphysis appears to be medially dislocated. There is contour abnormality of the mid aspect of the left clavicle which is likely secondary to old healed clavicle fracture. Kyphoplasty material within the visualized thoracic spine. Multiple soft tissue calcifications.  IMPRESSION: Comminuted proximal left humerus fracture. The proximal humeral diaphysis appears to be dislocated medially to the humeral head.  Likely chronic deformity of the mid body of the left clavicle, potentially secondary to old healed fracture. Recommend correlation with dedicated clavicle views.   Electronically Signed   By: Lovey Newcomer M.D.   On: 04/26/2015 12:53     EKG Interpretation None      MDM   Final diagnoses:  Fall  Humerus fracture, left, closed, initial encounter  Hypokalemia   Patient presents with mechanical fall and concern for proximal humerus fracture. Patient has had persisting cough recently and COPD history. Chest x-ray and shoulder x-ray pending. Patient requesting Tylenol for pain at this time. Patient lives with her son for assistance.  Patient says she is unable to tolerate any narcotic medication. Patient is requesting small dose of Ativan.  Discussed with orthopedic on call recommended CT scan for further delineation to see if shoulder dislocation along with fracture.  Discussed with patient's son who helps take care of her at home and he has medical problems of his own and it may be difficult for him by himself with her new fracture and increased  needs. [facility] hospitalist to discuss admission for likely rehabilitation placement.  CT pending.  The patients results and plan were reviewed and discussed.   Any x-rays performed were personally reviewed by myself.   Differential diagnosis were considered with the presenting HPI.  Medications  fentaNYL (SUBLIMAZE) injection 25 mcg (25 mcg Intravenous Not Given 04/26/15 1404)  potassium chloride SA (K-DUR,KLOR-CON) CR tablet 40 mEq (not administered)  sodium chloride 0.9 % bolus 500 mL (not administered)  0.9 %  sodium chloride infusion (not administered)  ondansetron (ZOFRAN) injection 4 mg (not administered)  acetaminophen (TYLENOL) tablet 1,000 mg (1,000 mg Oral Given 04/26/15 1239)  albuterol (PROVENTIL) (2.5 MG/3ML) 0.083% nebulizer solution 5 mg (5 mg Nebulization Given 04/26/15 1406)  LORazepam (ATIVAN) injection 0.5 mg (0.5 mg Intravenous Given 04/26/15 1421)    Filed Vitals:   04/26/15 1133 04/26/15 1138 04/26/15 1404  BP:  168/64 168/68  Pulse:  73 76  Temp:  98.8 F (37.1 C)   TempSrc:  Oral   Resp:  16 16  SpO2: 97% 93% 95%    Final diagnoses:  Fall  Humerus fracture, left, closed, initial encounter  Hypokalemia    Admission/ observation were discussed with the admitting physician, patient and/or family and they are comfortable with the plan.    Elnora Morrison, MD 04/26/15 [phone]

## 2015-04-26 NOTE — ED Notes (Signed)
Patient transported to X-ray 

## 2015-04-26 NOTE — Consult Note (Signed)
Reason for Consult: Left shoulder pain Referring Physician: [name] is an 81 y.o. female.  HPI: [name] is a 81 year old patient who lives alone who fell today in her home. She denies any loss of consciousness but does report significant left shoulder pain. Also describes left hand pain to a lesser degree. She denies any other orthopedic complaints in her lower extremities. She has multiple medical comorbidities including diagnoses of osteoporosis with kyphosis and multiple compression fractures treated with kyphoplasty  Past Medical History  Diagnosis Date  . Pernicious anemia   . Osteoarthritis   . Fibromyalgia   . Skin lesion     chronic calcific cutaneous lesions  . HX of multiple bleeding ulcers 09/01/2011  . Hypertension     takes Atenolol daily  . Emphysema   . Bronchitis     hx  . Adenocarcinoma of gastroesophageal junction   . Dizziness     d/t crystals in ears that "roll around"and can't get them out  . Back pain   . Osteoporosis   . Scoliosis   . Psoriasis   . H/O hiatal hernia   . GERD (gastroesophageal reflux disease)     takes Aciphex daily  . Gastric ulcer   . Constipation     since Chemo and Radiation;finished up in Nov 2012  . Diverticulosis   . History of colonic polyps   . Shingles     broke out 2wks ago;has been on meds and to finish up tomorrow  . Kidney stone 25+yrs ago  . Pernicious anemia   . Vitamin B deficiency   . Complication of anesthesia     shortness of breath and pain with gas   . COPD (chronic obstructive pulmonary disease)   . Shortness of breath     with exertion   . Blood transfusion     Past Surgical History  Procedure Laterality Date  . Right hip replacement  2001  . Tonsillectomy      as a child  . Tubal ligation  1975  . Appendectomy  1975  . Cataract surgery  10+yrs ago  . Esophagogastroduodenoscopy    . Colonoscopy    . Extubation  12/20/2011       . Partial esophagectomy  12/20/2011    Procedure:  ESOPHAGECTOMY PARTIAL;  Surgeon: Pierre Bali, MD;  Location: [facility];  Service: Thoracic;  Laterality: N/A;  . Total hip arthroplasty  06/30/2012    Procedure: TOTAL HIP ARTHROPLASTY ANTERIOR APPROACH;  Surgeon: Mcarthur Rossetti, MD;  Location: [facility];  Service: Orthopedics;  Laterality: Left;  Left total hip arthroplasty    Family History  Problem Relation Age of Onset  . Cancer Maternal Grandmother     gynecologic  . Cancer Maternal Aunt     gynecologic  . Breast cancer Paternal Aunt   . Breast cancer Paternal Aunt   . Breast cancer Paternal Aunt   . Anesthesia problems Neg Hx   . Hypotension Neg Hx   . Malignant hyperthermia Neg Hx   . Pseudochol deficiency Neg Hx     Social History:  reports that she quit smoking about 23 years ago. Her smoking use included Cigarettes. She smoked 3.00 packs per day. She has never used smokeless tobacco. She reports that she does not drink alcohol or use illicit drugs.  Allergies:  Allergies  Allergen Reactions  . Keflex [Cephalexin] Itching    "severe itching"  . Nulecit [Na Ferric Gluc Cplx In Sucrose] Diarrhea, Nausea  Only and Other (See Comments)      drop in BP  . Biaxin [Clarithromycin] Itching  . Fentanyl Itching  . Spiriva Handihaler [Tiotropium Bromide Monohydrate] Other (See Comments)    constipation  . Tramadol Itching    Skin burning and itching  . Adhesive [Tape] Itching  . Clarithromycin Itching  . Darvon Itching  . Epinephrine Itching  . Morphine And Related Itching  . Oxycodone Itching  . Sulfa Antibiotics Itching  . Vicodin [Hydrocodone-Acetaminophen] Itching    Can take with Benadryl    Medications: I have reviewed the patient's current medications.  Results for orders placed or performed during the hospital encounter of 04/26/15 (from the past 48 hour(s))  Basic metabolic panel     Status: Abnormal   Collection Time: 04/26/15  1:55 PM  Result Value Ref Range   Sodium 142 135 - 145 mmol/L   Potassium  3.0 (L) 3.5 - 5.1 mmol/L   Chloride 104 101 - 111 mmol/L   CO2 27 22 - 32 mmol/L   Glucose, Bld 121 (H) 65 - 99 mg/dL   BUN 15 6 - 20 mg/dL   Creatinine, Ser 0.75 0.44 - 1.00 mg/dL   Calcium 9.2 8.9 - 10.3 mg/dL   GFR calc non Af Amer >60 >60 mL/min   GFR calc Af Amer >60 >60 mL/min    Comment: (NOTE) The eGFR has been calculated using the CKD EPI equation. This calculation has not been validated in all clinical situations. eGFR's persistently <60 mL/min signify possible Chronic Kidney Disease.    Anion gap 11 5 - 15    Comment: Performed at [facility]  CBC     Status: Abnormal   Collection Time: 04/26/15  1:55 PM  Result Value Ref Range   WBC 11.9 (H) 4.0 - 10.5 K/uL   RBC 3.61 (L) 3.87 - 5.11 MIL/uL   Hemoglobin 9.7 (L) 12.0 - 15.0 g/dL   HCT 32.2 (L) 36.0 - 46.0 %   MCV 89.2 78.0 - 100.0 fL   MCH 26.9 26.0 - 34.0 pg   MCHC 30.1 30.0 - 36.0 g/dL   RDW 15.4 11.5 - 15.5 %   Platelets 343 150 - 400 K/uL    Dg Chest 1 View  04/26/2015   CLINICAL DATA:  Patient slipped and fell in the bathroom at home, injuring the left shoulder. Initial encounter.  EXAM: CHEST  1 VIEW  COMPARISON:  01/08/2015 and earlier.  FINDINGS: Cardiac silhouette upper normal in size, unchanged. Thoracic aorta atherosclerotic, unchanged. Right perihilar oval-shaped opacity, shown on prior CT to represent focal dilation of the intralobar pulmonary artery. Hilar and mediastinal contours otherwise unremarkable. Mildly prominent bronchovascular markings diffusely, unchanged. Minimal linear scarring in the left lung base, unchanged. No new pulmonary parenchymal abnormalities. No visible pleural effusions.  IMPRESSION: No acute cardiopulmonary disease.   Electronically Signed   By: Evangeline Dakin M.D.   On: 04/26/2015 12:56   Dg Shoulder Left  04/26/2015   CLINICAL DATA:  Patient status post fall with left shoulder pain. History of cancer.  EXAM: LEFT SHOULDER - 2+ VIEW  COMPARISON:  Chest radiograph  04/26/2015; 07/25/2014  FINDINGS: There is a markedly comminuted fracture of the proximal left humerus involving the left humeral head. The humeral head appears to be located on limited views however the proximal left humeral diaphysis appears to be medially dislocated. There is contour abnormality of the mid aspect of the left clavicle which is likely secondary to old healed clavicle  fracture. Kyphoplasty material within the visualized thoracic spine. Multiple soft tissue calcifications.  IMPRESSION: Comminuted proximal left humerus fracture. The proximal humeral diaphysis appears to be dislocated medially to the humeral head.  Likely chronic deformity of the mid body of the left clavicle, potentially secondary to old healed fracture. Recommend correlation with dedicated clavicle views.   Electronically Signed   By: Lovey Newcomer M.D.   On: 04/26/2015 12:53    Review of Systems  Constitutional: Negative.   HENT: Negative.   Eyes: Negative.   Respiratory: Positive for cough.   Cardiovascular: Negative.   Gastrointestinal: Negative.   Genitourinary: Negative.   Musculoskeletal: Positive for joint pain.  Skin: Negative.   Neurological: Negative.   Endo/Heme/Allergies: Negative.   Psychiatric/Behavioral: Negative.    Blood pressure 159/87, pulse 93, temperature 98.6 F (37 C), temperature source Oral, resp. rate 16, SpO2 93 %. Physical Exam  Constitutional: She appears well-developed.  HENT:  Head: Normocephalic.  Eyes: Pupils are equal, round, and reactive to light.  Neck: Normal range of motion.  Cardiovascular: Normal rate.   Respiratory: Effort normal.  Neurological: She is alert.  Skin: Skin is warm.  Psychiatric: She has a normal mood and affect.   examination the left arm demonstrates significant pain with any palpation her compartments are soft radial pulse the left arm is intact she has arthritis in the fingers her EPL FPL interosseous function is intact although it is slow there  some paresthesias on the dorsal ulnar aspect of the hand she also has a bruising and ecchymosis at the base of the thumb on the left-hand side she can flex her hips well without pain bend her knees well without pain. Right upper shoulder rehabilitation has no limitations ecchymosis or breathing or bruising in the wrist elbow or shoulder region  Assessment/Plan: Impression is comminuted proximal humerus fracture with unknown left hand injury as well. X-rays pending on the left hand. The proximal humerus fracture is one in which the shaft is displaced at least a shaft width medial to the head which remains located. Due to her age and osteoporosis I think operative fixation would potentially be fraught with complications. My plan at this time is for anesthetic muscle relaxation and an attempt at closed reduction just to see if I can get the head and shaft better aligned. The risk and benefits of the procedure discussed with the patient including not limited to infection nerve vessel damage potential that more fractures could be induced as well as the potential for more surgery in the future if he could not be done. Patient understands risk benefits and wished to proceed all questions answered  DEAN,GREGORY SCOTT 04/26/2015, 4:10 PM

## 2015-04-26 NOTE — ED Notes (Addendum)
Hospitalist at bedside 

## 2015-04-27 ENCOUNTER — Inpatient Hospital Stay (HOSPITAL_COMMUNITY): Payer: Medicare Other

## 2015-04-27 DIAGNOSIS — J439 Emphysema, unspecified: Secondary | ICD-10-CM

## 2015-04-27 DIAGNOSIS — S42209A Unspecified fracture of upper end of unspecified humerus, initial encounter for closed fracture: Secondary | ICD-10-CM | POA: Diagnosis present

## 2015-04-27 DIAGNOSIS — I5032 Chronic diastolic (congestive) heart failure: Secondary | ICD-10-CM

## 2015-04-27 DIAGNOSIS — S62502A Fracture of unspecified phalanx of left thumb, initial encounter for closed fracture: Secondary | ICD-10-CM

## 2015-04-27 LAB — CBC
HCT: 27.1 % — ABNORMAL LOW (ref 36.0–46.0)
Hemoglobin: 7.9 g/dL — ABNORMAL LOW (ref 12.0–15.0)
MCH: 26.6 pg (ref 26.0–34.0)
MCHC: 29.2 g/dL — ABNORMAL LOW (ref 30.0–36.0)
MCV: 91.2 fL (ref 78.0–100.0)
Platelets: 260 10*3/uL (ref 150–400)
RBC: 2.97 MIL/uL — ABNORMAL LOW (ref 3.87–5.11)
RDW: 15.8 % — ABNORMAL HIGH (ref 11.5–15.5)
WBC: 7.9 10*3/uL (ref 4.0–10.5)

## 2015-04-27 LAB — CREATININE, SERUM
CREATININE: 0.83 mg/dL (ref 0.44–1.00)
GFR calc Af Amer: >60 mL/min (ref >60)

## 2015-04-27 MED ORDER — PANTOPRAZOLE SODIUM 40 MG PO TBEC
40.0000 mg | DELAYED_RELEASE_TABLET | Freq: Every day | ORAL | Status: DC
  Administered 2015-04-27 – 2015-04-29 (×3): 40 mg via ORAL
  Filled 2015-04-27 (×4): qty 1

## 2015-04-27 MED ORDER — POLYETHYLENE GLYCOL 3350 17 G PO PACK
17.0000 g | PACK | Freq: Every day | ORAL | Status: DC
  Administered 2015-04-27 – 2015-04-30 (×4): 17 g via ORAL

## 2015-04-27 MED ORDER — SENNA 8.6 MG PO TABS
1.0000 | ORAL_TABLET | Freq: Every day | ORAL | Status: DC
  Administered 2015-04-28: 8.6 mg via ORAL

## 2015-04-27 MED ORDER — CLONIDINE HCL (ANALGESIA) 100 MCG/ML EP SOLN
EPIDURAL | Status: DC | PRN
  Administered 2015-04-27: 75 ug

## 2015-04-27 MED ORDER — OXYCODONE HCL 5 MG PO TABS: 5.0000 mg | ORAL_TABLET | ORAL | Status: DC | PRN

## 2015-04-27 MED ORDER — ALBUTEROL SULFATE (2.5 MG/3ML) 0.083% IN NEBU
2.5000 mg | INHALATION_SOLUTION | RESPIRATORY_TRACT | Status: DC | PRN
Start: 2015-04-27 — End: 2015-04-30

## 2015-04-27 MED ORDER — METOCLOPRAMIDE HCL 5 MG/ML IJ SOLN: 5.0000 mg | Freq: Three times a day (TID) | INTRAMUSCULAR | Status: DC | PRN

## 2015-04-27 MED ORDER — BUPIVACAINE HCL 0.25 % IJ SOLN
INTRAMUSCULAR | Status: DC | PRN
  Administered 2015-04-27: 10 mL

## 2015-04-27 MED ORDER — FENTANYL CITRATE (PF) 100 MCG/2ML IJ SOLN
INTRAMUSCULAR | Status: AC
  Filled 2015-04-27: qty 2

## 2015-04-27 MED ORDER — ENOXAPARIN SODIUM 30 MG/0.3ML ~~LOC~~ SOLN
30.0000 mg | SUBCUTANEOUS | Status: DC
  Administered 2015-04-27 – 2015-04-29 (×3): 30 mg via SUBCUTANEOUS
  Filled 2015-04-27 (×4): qty 0.3

## 2015-04-27 MED ORDER — ACETAMINOPHEN 500 MG PO TABS
1000.0000 mg | ORAL_TABLET | Freq: Three times a day (TID) | ORAL | Status: DC
  Administered 2015-04-27 – 2015-04-30 (×7): 1000 mg via ORAL
  Filled 2015-04-27 (×13): qty 2

## 2015-04-27 MED ORDER — ACETAMINOPHEN 325 MG PO TABS: 650.0000 mg | ORAL_TABLET | Freq: Four times a day (QID) | ORAL | Status: DC | PRN

## 2015-04-27 MED ORDER — ONDANSETRON HCL 4 MG PO TABS
4.0000 mg | ORAL_TABLET | Freq: Four times a day (QID) | ORAL | Status: DC | PRN
  Filled 2015-04-27: qty 1

## 2015-04-27 MED ORDER — ACETAMINOPHEN 650 MG RE SUPP
650.0000 mg | Freq: Four times a day (QID) | RECTAL | Status: DC | PRN
  Filled 2015-04-27: qty 1

## 2015-04-27 MED ORDER — HYDROMORPHONE HCL 1 MG/ML IJ SOLN
0.5000 mg | INTRAMUSCULAR | Status: DC | PRN
  Administered 2015-04-28: 0.5 mg via INTRAVENOUS
  Filled 2015-04-27: qty 1

## 2015-04-27 MED ORDER — HYDROMORPHONE HCL 1 MG/ML IJ SOLN
INTRAMUSCULAR | Status: AC
  Administered 2015-04-28: 0.5 mg via INTRAVENOUS
  Filled 2015-04-27: qty 1

## 2015-04-27 MED ORDER — METOCLOPRAMIDE HCL 5 MG PO TABS
5.0000 mg | ORAL_TABLET | Freq: Three times a day (TID) | ORAL | Status: DC | PRN
  Filled 2015-04-27: qty 2

## 2015-04-27 MED ORDER — ONDANSETRON HCL 4 MG/2ML IJ SOLN
4.0000 mg | Freq: Four times a day (QID) | INTRAMUSCULAR | Status: DC | PRN
  Administered 2015-04-29 (×2): 4 mg via INTRAVENOUS
  Filled 2015-04-27 (×2): qty 2

## 2015-04-27 MED ORDER — HYDROMORPHONE HCL 1 MG/ML IJ SOLN
0.2500 mg | INTRAMUSCULAR | Status: DC | PRN
  Administered 2015-04-27 (×4): 0.25 mg via INTRAVENOUS

## 2015-04-27 MED ORDER — LIDOCAINE HCL (CARDIAC) 20 MG/ML IV SOLN
INTRAVENOUS | Status: DC | PRN
  Administered 2015-04-26: 80 mg via INTRAVENOUS

## 2015-04-27 MED ORDER — FENTANYL CITRATE (PF) 100 MCG/2ML IJ SOLN
INTRAMUSCULAR | Status: DC | PRN
  Administered 2015-04-26: 25 ug via INTRAVENOUS

## 2015-04-27 MED ORDER — PROPOFOL 10 MG/ML IV BOLUS
INTRAVENOUS | Status: DC | PRN
  Administered 2015-04-26: 90 mg via INTRAVENOUS

## 2015-04-27 MED ORDER — SUCCINYLCHOLINE CHLORIDE 20 MG/ML IJ SOLN
INTRAMUSCULAR | Status: DC | PRN
  Administered 2015-04-26: 100 mg via INTRAVENOUS
  Administered 2015-04-27: 20 mg via INTRAVENOUS

## 2015-04-27 MED ORDER — FLUTICASONE FUROATE-VILANTEROL 200-25 MCG/INH IN AEPB
2.0000 | INHALATION_SPRAY | Freq: Every morning | RESPIRATORY_TRACT | Status: DC
Start: 2015-04-27 — End: 2015-04-27

## 2015-04-27 NOTE — Progress Notes (Signed)
   CONCERNING: IV to Oral Route Change Policy  RECOMMENDATION: This patient is receiving protonix by the intravenous route.  Based on criteria approved by the Pharmacy and Therapeutics Committee, the intravenous medication(s) is/are being converted to the equivalent oral dose form(s).   DESCRIPTION: These criteria include:  The patient is eating (either orally or via tube) and/or has been taking other orally administered medications for a least 24 hours  The patient has no evidence of active gastrointestinal bleeding or impaired GI absorption (gastrectomy, short bowel, patient on TNA or NPO).  If you have questions about this conversion, please contact the Pharmacy Department  []   [phone] )  [facility] []   [phone] )  [facility] []   [phone] )  [facility] []   [phone] )  [facility] [x]   [phone] )  [place], [facility] 04/27/2015 12:25 PM

## 2015-04-27 NOTE — Anesthesia Procedure Notes (Signed)
Procedure Name: Intubation Date/Time: 04/26/2015 11:58 PM Performed by: Carleene Cooper A Pre-anesthesia Checklist: Timeout performed, Patient identified, Emergency Drugs available, Suction available and Patient being monitored Patient Re-evaluated:Patient Re-evaluated prior to inductionOxygen Delivery Method: Circle system utilized Preoxygenation: Pre-oxygenation with 100% oxygen Intubation Type: IV induction Ventilation: Mask ventilation without difficulty Laryngoscope Size: Mac and 3 Grade View: Grade I Tube type: Oral Tube size: 7.5 mm Number of attempts: 1 Airway Equipment and Method: Stylet Placement Confirmation: breath sounds checked- equal and bilateral,  ETT inserted through vocal cords under direct vision and positive ETCO2 Secured at: 21 cm Tube secured with: Tape Dental Injury: Teeth and Oropharynx as per pre-operative assessment

## 2015-04-27 NOTE — Progress Notes (Signed)
OT Cancellation Note  Patient Details Name: [name] MRN: [id number] DOB: 25-Oct-1934   Cancelled Treatment:    Reason Eval/Treat Not Completed: Other (comment) -- Attempted OT evaluation x 2. On first attempt, patient complaining of itching and requesting Benadryl. She also did not receive a breakfast tray and wanted to eat prior to therapy. RN made aware of both issues. On second attempt, patient had eaten breakfast and had received Benadryl. Patient refused to participate in OT evaluation, asking, "Can't we do it tomorrow?" OT will follow up tomorrow for evaluation. Patient may need PT evaluation as well, which has not yet been ordered. Thank you.  Wyat Infinger A 04/27/2015, 10:27 AM

## 2015-04-27 NOTE — Progress Notes (Signed)
PATIENT DETAILS Name: [name] Age: 81 y.o. Sex: female Date of Birth: 05-04-1934 Admit Date: 04/26/2015 Admitting Physician Oswald Hillock, MD ONG:[id number], Malka So, MD  Subjective: Decreased pain in the left arm. Left upper extremity in sling. Some swelling at the left palm area.  Assessment/Plan: Active Problems:   Left Proximal humerus fracture: Secondary to a mechanical fall. Denied syncopal episode. Seen by orthopedics, underwent closed reduction of the left proximal humerus fracture. Recommendations orthopedics are to continue sling for 3 weeks before initiating range of motion exercises. Continue supportive care with as needed narcotics    Left thumb proximal phalanx fracture: Discussed with orthopedics, recommendations are for splint immobilization.    Anemia: Has history of chronic anemia, hemoglobin down to 7.9 without any overt evidence of bleeding.? Secondary to humeral fracture. Stop IV fluids, recheck hemoglobin in a.m.    Hypokalemia: Recheck electrolytes in a.m. Given potassium supplementation on 5/21    COPD: Appears stable with clear lungs. Continue bronchodilators.    Chronic diastolic heart failure: Appears to indicate compensated. Continue Lasix, atenolol and lisinopril. Follow closely.    Hypertension: BP controlled. Continue amlodipine, atenolol, lisinopril and Lasix. Following adjust accordingly.    History of adenocarcinoma of the Gastro- esophageal junction: Completed chemotherapy/radiation. Continue PPI.    History of osteoarthritis/fibromyalgia: On chronic low-dose prednisone. Continue.  Disposition: Remain inpatient-suspect may require SNF on discharge. Await PT eval.  Antimicrobial agents  See below  Anti-infectives    None      DVT Prophylaxis: Prophylactic Lovenox   Code Status: Full code  Family Communication None at bedside  Procedures: 5/21-closed reduction of the left proximal humerus  fracture  CONSULTS:  orthopedic surgery  Time spent 40 minutes-Greater than 50% of this time was spent in counseling, explanation of diagnosis, planning of further management, and coordination of care.  MEDICATIONS: Scheduled Meds: . amLODipine  2.5 mg Oral q morning - 10a  . aspirin EC  81 mg Oral q morning - 10a  . atenolol  50 mg Oral BID  . enoxaparin (LOVENOX) injection  30 mg Subcutaneous Q24H  . furosemide  20 mg Oral q morning - 10a  . guaiFENesin  600 mg Oral BID  . ipratropium-albuterol  3 mL Nebulization BID  . lisinopril  40 mg Oral QPM  . loratadine  10 mg Oral q morning - 10a  . pantoprazole  40 mg Oral QPC supper  . predniSONE  2 mg Oral Q breakfast   Continuous Infusions:  PRN Meds:.acetaminophen **OR** acetaminophen, diazepam, diphenhydrAMINE, HYDROmorphone (DILAUDID) injection, ketorolac, LORazepam, metoCLOPramide **OR** metoCLOPramide (REGLAN) injection, ondansetron **OR** ondansetron (ZOFRAN) IV, polyvinyl alcohol, tiZANidine    PHYSICAL EXAM: Vital signs in last 24 hours: Filed Vitals:   04/27/15 0500 04/27/15 0746 04/27/15 0802 04/27/15 1009  BP: 119/54   109/54  Pulse: 65   79  Temp: 98.1 F (36.7 C)   98.8 F (37.1 C)  TempSrc: Oral   Oral  Resp: 14   14  SpO2: 98% 95% 97% 96%    Weight change:  There were no vitals filed for this visit. There is no weight on file to calculate BMI.   Gen Exam: Awake and alert with clear speech.  Neck: Supple, No JVD.   Chest: B/L Clear.   CVS: S1 S2 Regular, no murmurs.  Abdomen: soft, BS +, non tender, non distended.  Extremities: no edema, lower extremities warm to touch.  Left upper extremity in sling. Neurologic: Non Focal.   Skin: No Rash.   Wounds: N/A.   Intake/Output from previous day:  Intake/Output Summary (Last 24 hours) at 04/27/15 1413 Last data filed at 04/27/15 1009  Gross per 24 hour  Intake 1581.67 ml  Output    300 ml  Net 1281.67 ml     LAB RESULTS: CBC  Recent Labs Lab  04/26/15 1355 04/27/15 0733  WBC 11.9* 7.9  HGB 9.7* 7.9*  HCT 32.2* 27.1*  PLT 343 260  MCV 89.2 91.2  MCH 26.9 26.6  MCHC 30.1 29.2*  RDW 15.4 15.8*    Chemistries   Recent Labs Lab 04/26/15 1355 04/27/15 0733  NA 142  --   K 3.0*  --   CL 104  --   CO2 27  --   GLUCOSE 121*  --   BUN 15  --   CREATININE 0.75 0.83  CALCIUM 9.2  --     CBG: No results for input(s): GLUCAP in the last 168 hours.  GFR CrCl cannot be calculated (Unknown ideal weight.).  Coagulation profile No results for input(s): INR, PROTIME in the last 168 hours.  Cardiac Enzymes No results for input(s): CKMB, TROPONINI, MYOGLOBIN in the last 168 hours.  Invalid input(s): CK  Invalid input(s): POCBNP No results for input(s): DDIMER in the last 72 hours. No results for input(s): HGBA1C in the last 72 hours. No results for input(s): CHOL, HDL, LDLCALC, TRIG, CHOLHDL, LDLDIRECT in the last 72 hours. No results for input(s): TSH, T4TOTAL, T3FREE, THYROIDAB in the last 72 hours.  Invalid input(s): FREET3 No results for input(s): VITAMINB12, FOLATE, FERRITIN, TIBC, IRON, RETICCTPCT in the last 72 hours. No results for input(s): LIPASE, AMYLASE in the last 72 hours.  Urine Studies No results for input(s): UHGB, CRYS in the last 72 hours.  Invalid input(s): UACOL, UAPR, USPG, UPH, UTP, UGL, UKET, UBIL, UNIT, UROB, ULEU, UEPI, UWBC, URBC, UBAC, CAST, UCOM, BILUA  MICROBIOLOGY: Recent Results (from the past 240 hour(s))  Surgical pcr screen     Status: Abnormal   Collection Time: 04/26/15  5:14 PM  Result Value Ref Range Status   MRSA, PCR NEGATIVE NEGATIVE Final   Staphylococcus aureus POSITIVE (A) NEGATIVE Final    Comment:        The Xpert SA Assay (FDA approved for NASAL specimens in patients over 33 years of age), is one component of a comprehensive surveillance program.  Test performance has been validated by [facility] for patients greater than or equal to 95 year old. It is  not intended to diagnose infection nor to guide or monitor treatment.     RADIOLOGY STUDIES/RESULTS: Dg Chest 1 View  04/26/2015   CLINICAL DATA:  Patient slipped and fell in the bathroom at home, injuring the left shoulder. Initial encounter.  EXAM: CHEST  1 VIEW  COMPARISON:  01/08/2015 and earlier.  FINDINGS: Cardiac silhouette upper normal in size, unchanged. Thoracic aorta atherosclerotic, unchanged. Right perihilar oval-shaped opacity, shown on prior CT to represent focal dilation of the intralobar pulmonary artery. Hilar and mediastinal contours otherwise unremarkable. Mildly prominent bronchovascular markings diffusely, unchanged. Minimal linear scarring in the left lung base, unchanged. No new pulmonary parenchymal abnormalities. No visible pleural effusions.  IMPRESSION: No acute cardiopulmonary disease.   Electronically Signed   By: Evangeline Dakin M.D.   On: 04/26/2015 12:56   Ct Shoulder Left Wo Contrast  04/26/2015   CLINICAL DATA:  Displaced left proximal humeral fracture.  EXAM: CT OF THE LEFT SHOULDER WITHOUT CONTRAST  TECHNIQUE: Multidetector CT imaging was performed according to the standard protocol. Multiplanar CT image reconstructions were also generated.  COMPARISON:  04/26/2015.  FINDINGS: Highly displaced surgical neck fracture of the left proximal humerus with 1.2 cm overlap, 3.1 cm anterior displacement of the distal fracture fragment, and 2.6 cm medial displacement of the distal fracture fragment. Accordingly, the metaphysis and shaft are significantly anteromedial to the humeral head, in the fractured surfaces are not in direct contact. The humeral head continues to articulate with the glenoid in the expected fashion.  There is a fracture the greater tuberosity but this is nondisplaced. No lesser tuberosity fracture fragment. I do not observe a scapular fracture or an adjacent rib fracture. T5 compression fracture may well be chronic. T6, T8, and T9 thoracic compression  fractures with vertebral augmentations.  Atherosclerotic aortic arch.  IMPRESSION: 1. Two part surgical neck fracture left proximal humerus with the metaphysis/shaft fragment displaced 3.1 cm anterior and 2.6 cm medially with respect to the femoral head, which continues to articulate with the glenoid. There is 1.2 cm of overlap and the fractured surfaces are not contiguous. Please note that although this is only at 2 part fracture, the current degree of displacement is striking. 2. There is a nondisplaced fracture the greater tuberosity. Because this is nondisplaced, it does not make this fracture a 3 part fracture. 3. T5 compression fracture, probably chronic. Old T6, T8, and T9 thoracic compression fractures with vertebral augmentations.   Electronically Signed   By: Van Clines M.D.   On: 04/26/2015 16:32   Dg Shoulder Left  04/27/2015   CLINICAL DATA:  Closed manipulation of left humerus dislocation.  EXAM: LEFT SHOULDER - 2+ VIEW; DG C-ARM 1-60 MIN - NRPT [facility]  COMPARISON:  Preoperative radiographs and CT.  FINDINGS: Two fluoroscopic spot images of the left shoulder during closed manipulation demonstrates improved alignment of the proximal humerus fracture. Fluoroscopy time reported 6 seconds.  IMPRESSION: Intraoperative fluoroscopy during close manipulation of left proximal humerus fracture, the fracture is in improved alignment.   Electronically Signed   By: Jeb Levering M.D.   On: 04/27/2015 03:29   Dg Shoulder Left  04/26/2015   CLINICAL DATA:  Patient status post fall with left shoulder pain. History of cancer.  EXAM: LEFT SHOULDER - 2+ VIEW  COMPARISON:  Chest radiograph 04/26/2015; 07/25/2014  FINDINGS: There is a markedly comminuted fracture of the proximal left humerus involving the left humeral head. The humeral head appears to be located on limited views however the proximal left humeral diaphysis appears to be medially dislocated. There is contour abnormality of the mid aspect of  the left clavicle which is likely secondary to old healed clavicle fracture. Kyphoplasty material within the visualized thoracic spine. Multiple soft tissue calcifications.  IMPRESSION: Comminuted proximal left humerus fracture. The proximal humeral diaphysis appears to be dislocated medially to the humeral head.  Likely chronic deformity of the mid body of the left clavicle, potentially secondary to old healed fracture. Recommend correlation with dedicated clavicle views.   Electronically Signed   By: Lovey Newcomer M.D.   On: 04/26/2015 12:53   Dg Shoulder Left Port  04/27/2015   CLINICAL DATA:  Proximal left humerus fracture postreduction.  EXAM: LEFT SHOULDER - 1 VIEW  COMPARISON:  Pre reduction views 1 day prior.  FINDINGS: Improved alignment of the comminuted displaced proximal humerus fracture with persistent but improved displacement of the humeral diaphysis. Glenohumeral alignment is suboptimally  assessed, however humeral head is likely normally located. Soft tissue calcifications are again seen.  IMPRESSION: Improved alignment of comminuted displaced proximal humerus fracture. The glenohumeral alignment is suboptimally assessed, however humeral head is likely normally located.   Electronically Signed   By: Jeb Levering M.D.   On: 04/27/2015 02:18   Dg Hand Complete Left  04/26/2015   CLINICAL DATA:  Left hand pain after fall today. History of arthritis.  EXAM: LEFT HAND - COMPLETE 3+ VIEW  COMPARISON:  None.  FINDINGS: Advanced degenerative changes in the first carpometacarpal joint and IP joints diffusely. Joint space narrowing in the in the CP joints. There is a fracture through the proximal phalanx of the left thumb with mild angulation. No additional fracture noted.  IMPRESSION: Fracture through the base of the left thumb proximal phalanx with mild angulation.   Electronically Signed   By: Rolm Baptise M.D.   On: 04/26/2015 18:19   Dg C-arm 1-60 Min-no Report  04/27/2015   CLINICAL DATA: left  humerus dislocation   C-ARM 1-60 MINUTES  Fluoroscopy was utilized by the requesting physician.  No radiographic  interpretation.     Oren Binet, MD  [facility] Pager:[phone]  If 7PM-7AM, please contact night-coverage [email or web address] Password [email or web address] 04/27/2015, 2:13 PM   LOS: 1 day

## 2015-04-27 NOTE — Anesthesia Postprocedure Evaluation (Signed)
  Anesthesia Post-op Note  Patient: [name]  Procedure(s) Performed: Procedure(s): CLOSED MANIPULATION SHOULDER  Patient Location: PACU  Anesthesia Type: General  Level of Consciousness: awake and alert   Airway and Oxygen Therapy: Patient Spontanous Breathing  Post-op Pain: mild  Post-op Assessment: Post-op Vital signs reviewed, Patient's Cardiovascular Status Stable, Respiratory Function Stable, Patent Airway and No signs of Nausea or vomiting  Last Vitals:  Filed Vitals:   04/27/15 0500  BP: 119/54  Pulse: 65  Temp: 36.7 C  Resp: 14    Post-op Vital Signs: stable   Complications: No apparent anesthesia complications.

## 2015-04-27 NOTE — Progress Notes (Signed)
Spoke to Dr. Sloan Leiter about pt.'s BP running low...holding 40 mg dose lisinopril scheduled for 1800 per MD order.

## 2015-04-27 NOTE — Op Note (Signed)
NAMEJASHA, HODZIC NO.:  [id number]  MEDICAL RECORD NO.:  [id number]  LOCATION:  [facility]                         FACILITY:  [facility]  PHYSICIAN:  Anderson Malta, M.D.    DATE OF BIRTH:  [birth year]  DATE OF PROCEDURE:  04/26/2015 DATE OF DISCHARGE:                              OPERATIVE REPORT   PREOPERATIVE DIAGNOSIS:  Left proximal humerus fracture.  POSTOPERATIVE DIAGNOSIS:  Left proximal humerus fracture.  PROCEDURE:  Closed reduction of left proximal humerus fracture.  SURGEONS:  Anderson Malta, M.D.  ASSISTANT:  None.  ANESTHESIA:  General.  INDICATIONS:  Anelis is a 81 year old female, left proximal humerus fracture, significantly displaced, presents for operative management and explanation of risks and benefits.  PROCEDURE IN DETAIL:  The patient was brought to operating room where general anesthetic was induced.  Time-out was called.  Under muscle relaxation in the anterior medial dislocation of the shaft in relation to the head was reduced with posterolateral course.  Good reduction was achieved in the AP and lateral planes under fluoroscopy.  The reduction was found to be stable with rotation.  The patient was placed in a shoulder immobilizer with __________ strap.  Numbing medicine was placed into the shoulder joint.  10 mL Marcaine, 3-4 mL of clonidine.  The patient tolerated the procedure well without immediate complications. Transferred to recovery in stable condition.  Did discuss the case with her son.     Anderson Malta, M.D.     GSD/MEDQ  D:  04/27/2015  T:  04/27/2015  Job:  [id number]

## 2015-04-27 NOTE — Brief Op Note (Signed)
04/26/2015 - 04/27/2015  12:20 AM  PATIENT:  [name]  81 y.o. female  PRE-OPERATIVE DIAGNOSIS:  proximal humerus fracture  POST-OPERATIVE DIAGNOSIS:  proximal humerus fracture  PROCEDURE:  Procedure(s): closed REDUCTION INTERNAL FIXATION (ORIF) PROXIMAL HUMERUS FRACTURE  SURGEON:  Surgeon(s): Meredith Pel, MD  ASSISTANT: none  ANESTHESIA:   general  EBL: 0 ml    Total I/O In: -  Out: 150 [Urine:150]  BLOOD ADMINISTERED: none  DRAINS: none   LOCAL MEDICATIONS USED:  Marcaine clonidine  SPECIMEN:  No Specimen  COUNTS:  YES  TOURNIQUET:  * No tourniquets in log *  DICTATION: .Other Dictation: Dictation Number [id number]  PLAN OF CARE: Admit to inpatient   PATIENT DISPOSITION:  PACU - hemodynamically stable

## 2015-04-27 NOTE — Progress Notes (Signed)
Patient stable states her arm on the left feels better after reduction Patient also reports left thumb pain. Radiographs here demonstrate mildly any later fracture the proximal phalanx of the thumb She is adamantly opposed to any surgical intervention Plan to keep in sling for 3 weeks before initiating range of motion exercises Okay to plan for skilled nursing placement early this week Plan thumb spica splint immobilization for the left thumb

## 2015-04-27 NOTE — Transfer of Care (Signed)
Immediate Anesthesia Transfer of Care Note  Patient: [name]  Procedure(s) Performed: Procedure(s): CLOSED MANIPULATION SHOULDER  Patient Location: PACU  Anesthesia Type:General  Level of Consciousness: awake, alert , oriented and patient cooperative  Airway & Oxygen Therapy: Patient Spontanous Breathing and Patient connected to nasal cannula oxygen  Post-op Assessment: Report given to RN, Post -op Vital signs reviewed and stable and Patient moving all extremities  Post vital signs: Reviewed and stable  Last Vitals:  Filed Vitals:   04/26/15 2129  BP: 135/74  Pulse: 85  Temp: 37.9 C  Resp: 16    Complications: No apparent anesthesia complications

## 2015-04-28 ENCOUNTER — Encounter (HOSPITAL_COMMUNITY): Payer: Self-pay | Admitting: Orthopedic Surgery

## 2015-04-28 DIAGNOSIS — J42 Unspecified chronic bronchitis: Secondary | ICD-10-CM

## 2015-04-28 DIAGNOSIS — D62 Acute posthemorrhagic anemia: Secondary | ICD-10-CM

## 2015-04-28 LAB — BASIC METABOLIC PANEL
ANION GAP: 8 (ref 5–15)
BUN: 17 mg/dL (ref 6–20)
CALCIUM: 9.3 mg/dL (ref 8.9–10.3)
CHLORIDE: 106 mmol/L (ref 101–111)
CO2: 27 mmol/L (ref 22–32)
Creatinine, Ser: 0.94 mg/dL (ref 0.44–1.00)
GFR, EST NON AFRICAN AMERICAN: 57 mL/min — AB (ref >60)
GLUCOSE: 112 mg/dL — AB (ref 65–99)
POTASSIUM: 3.7 mmol/L (ref 3.5–5.1)
Sodium: 141 mmol/L (ref 135–145)

## 2015-04-28 LAB — CBC
HCT: 27.9 % — ABNORMAL LOW (ref 36.0–46.0)
HEMOGLOBIN: 8.2 g/dL — AB (ref 12.0–15.0)
MCH: 26.9 pg (ref 26.0–34.0)
MCHC: 29.4 g/dL — ABNORMAL LOW (ref 30.0–36.0)
MCV: 91.5 fL (ref 78.0–100.0)
Platelets: 272 10*3/uL (ref 150–400)
RBC: 3.05 MIL/uL — ABNORMAL LOW (ref 3.87–5.11)
RDW: 15.9 % — AB (ref 11.5–15.5)
WBC: 10.1 10*3/uL (ref 4.0–10.5)

## 2015-04-28 MED ORDER — FUROSEMIDE 10 MG/ML IJ SOLN
20.0000 mg | Freq: Once | INTRAMUSCULAR | Status: AC
  Administered 2015-04-28: 20 mg via INTRAVENOUS
  Filled 2015-04-28: qty 2

## 2015-04-28 MED ORDER — IPRATROPIUM-ALBUTEROL 0.5-2.5 (3) MG/3ML IN SOLN
3.0000 mL | Freq: Four times a day (QID) | RESPIRATORY_TRACT | Status: DC
  Administered 2015-04-28 – 2015-04-30 (×7): 3 mL via RESPIRATORY_TRACT
  Filled 2015-04-28 (×6): qty 3

## 2015-04-28 NOTE — Clinical Social Work Placement (Signed)
   CLINICAL SOCIAL WORK PLACEMENT  NOTE  Date:  04/28/2015  Patient Details  Name: [name] MRN: [id number] Date of Birth: [birth year]  Clinical Social Work is seeking post-discharge placement for this patient at the [place] level of care (*CSW will initial, date and re-position this form in  chart as items are completed):  Yes   Patient/family provided with [place] Work Department's list of facilities offering this level of care within the geographic area requested by the patient (or if unable, by the patient's family).  Yes   Patient/family informed of their freedom to choose among providers that offer the needed level of care, that participate in Medicare, Medicaid or managed care program needed by the patient, have an available bed and are willing to accept the patient.  Yes   Patient/family informed of [facility]'s ownership interest in [facility] and [facility], as well as of the fact that they are under no obligation to receive care at these facilities.  PASRR submitted to EDS on 04/28/15     PASRR number received on 04/28/15     Existing PASRR number confirmed on       FL2 transmitted to all facilities in geographic area requested by pt/family on 04/28/15     FL2 transmitted to all facilities within larger geographic area on       Patient informed that his/her managed care company has contracts with or will negotiate with certain facilities, including the following:            Patient/family informed of bed offers received.  Patient chooses bed at       Physician recommends and patient chooses bed at      Patient to be transferred to   on  .  Patient to be transferred to facility by       Patient family notified on   of transfer.  Name of family member notified:        PHYSICIAN       Additional Comment:    _______________________________________________ Luretha Rued, LCSW 04/28/2015, 3:27  PM

## 2015-04-28 NOTE — Evaluation (Signed)
Physical Therapy Evaluation Patient Details Name: [name] MRN: [id number] DOB: [birth year] Today's Date: 04/28/2015   History of Present Illness  Pt is a 81 year old female s/p L proximal humerus fracture and Left thumb proximal phalanx fracture. Pt is s/p closed manipulation of L shoulder.   Clinical Impression  Pt admitted with above diagnosis. Pt currently with functional limitations due to the deficits listed below (see PT Problem List).  Pt will benefit from skilled PT to increase their independence and safety with mobility to allow discharge to the venue listed below.   Pt assisted OOB to [facility] then back to bed, pt reporting increased pain and did not wish to perform any other activities at this time.  Recommending SNF currently.     Follow Up Recommendations SNF    Equipment Recommendations  Other (comment) (TBA if home)    Recommendations for Other Services       Precautions / Restrictions Precautions Precautions: Fall;Shoulder Shoulder Interventions: Shoulder sling/immobilizer Precaution Comments: L thumb spica splint Required Braces or Orthoses: Sling Restrictions Weight Bearing Restrictions: Yes LUE Weight Bearing: Non weight bearing      Mobility  Bed Mobility Overal bed mobility: Needs Assistance Bed Mobility: Supine to Sit;Sit to Supine     Supine to sit: Mod assist Sit to supine: Mod assist   General bed mobility comments: assist for LEs over to EOB and trunk to upright to sit EOB; assist for LEs back onto bed and lower shoulders to bed.   Transfers Overall transfer level: Needs assistance Equipment used: 1 person hand held assist Transfers: Sit to/from [facility] Sit to Stand: Mod assist Stand pivot transfers: Min assist       General transfer comment: verbal cues for hand placement. Did better with standing from [facility] surface which also provided 1 HHA as well.   Ambulation/Gait Ambulation/Gait assistance:  (pt reporting too  much pain)              Stairs            Wheelchair Mobility    Modified Rankin (Stroke Patients Only)       Balance                                             Pertinent Vitals/Pain Pain Assessment: 0-10 Pain Score: 10-Worst pain ever Pain Location: c/o pain in bil LEs and L UE 8/10 at rest and 10/10 with activity Pain Descriptors / Indicators: Aching;Sore;Grimacing;Guarding Pain Intervention(s): Premedicated before session;Limited activity within patient's tolerance;Monitored during session;Repositioned    Home Living Family/patient expects to be discharged to:: Skilled nursing facility Living Arrangements: Children Available Help at Discharge: Family Type of Home: House Home Access: Level entry     Home Layout: Two level;Able to live on main level with bedroom/bathroom Home Equipment: Bedside commode;Walker - 2 wheels;Walker - 4 wheels;Cane - single point;Shower seat;Crutches      Prior Function Level of Independence: Independent               Hand Dominance   Dominant Hand: Right    Extremity/Trunk Assessment   Upper Extremity Assessment: Defer to OT evaluation           Lower Extremity Assessment: Generalized weakness         Communication   Communication: No difficulties  Cognition Arousal/Alertness: Awake/alert Behavior During Therapy: [facility] for  tasks assessed/performed Overall Cognitive Status: Within Functional Limits for tasks assessed                      General Comments      Exercises        Assessment/Plan    PT Assessment Patient needs continued PT services  PT Diagnosis Difficulty walking;Acute pain   PT Problem List Decreased strength;Decreased mobility;Decreased knowledge of precautions;Decreased knowledge of use of DME;Pain  PT Treatment Interventions DME instruction;Gait training;Functional mobility training;Patient/family education;Therapeutic activities;Therapeutic exercise    PT Goals (Current goals can be found in the Care Plan section) Acute Rehab PT Goals Patient Stated Goal: I know I need to go to rehab. PT Goal Formulation: With patient Time For Goal Achievement: 05/05/15 Potential to Achieve Goals: Good    Frequency Min 3X/week   Barriers to discharge        Co-evaluation PT/OT/SLP Co-Evaluation/Treatment: Yes Reason for Co-Treatment: For patient/therapist safety PT goals addressed during session: Mobility/safety with mobility;Balance OT goals addressed during session: ADL's and self-care       End of Session Equipment Utilized During Treatment: Gait belt Activity Tolerance: Patient limited by pain Patient left: in bed;with call bell/phone within reach           Time: 1113-1131 PT Time Calculation (min) (ACUTE ONLY): 18 min   Charges:   PT Evaluation $Initial PT Evaluation Tier I: 1 Procedure     PT G Codes:        [name],KATHrine E 04/28/2015, 1:24 PM Carmelia Bake, PT, DPT 04/28/2015 Pager: [phone]

## 2015-04-28 NOTE — Evaluation (Signed)
Occupational Therapy Evaluation Patient Details Name: [name] MRN: [id number] DOB: 1933/12/21 Today's Date: 04/28/2015    History of Present Illness Pt is s/p L proximal humerus fracture and Left thumb proximal phalanx fracture. Pt is s/p closed manipulation of L shoulder.    Clinical Impression   Pt limited by 8/10 pain at start of session and 10/10 pain as session progressed with mobility. Pt planning SNF at d/c. Will follow on acute to progress ADL independence for next venue.     Follow Up Recommendations  SNF;Supervision/Assistance - 24 hour    Equipment Recommendations  None recommended by OT    Recommendations for Other Services       Precautions / Restrictions Precautions Precautions: Fall;Shoulder Shoulder Interventions: Shoulder sling/immobilizer Precaution Comments: L thumb spica splint Required Braces or Orthoses: Sling Restrictions Weight Bearing Restrictions: Yes LUE Weight Bearing: Non weight bearing      Mobility Bed Mobility Overal bed mobility: Needs Assistance Bed Mobility: Supine to Sit;Sit to Supine     Supine to sit: Mod assist Sit to supine: Mod assist   General bed mobility comments: assist for LEs over to EOB and trunk to upright to sit EOB; assist for LEs back onto bed and lower shoulders to bed.   Transfers Overall transfer level: Needs assistance Equipment used: None Transfers: Sit to/from Stand Sit to Stand: Mod assist         General transfer comment: verbal cues for hand placement. Did better with standing from [facility] surface.     Balance                                            ADL Overall ADL's : Needs assistance/impaired Eating/Feeding: Set up;Bed level   Grooming: Wash/dry face;Set up;Bed level   Upper Body Bathing: Maximal assistance;Sitting   Lower Body Bathing: Sit to/from stand;Maximal assistance;+2 for physical assistance   Upper Body Dressing : Total assistance;Sitting   Lower  Body Dressing: Total assistance;Sit to/from stand;+2 for physical assistance   Toilet Transfer: Moderate assistance;Stand-pivot;BSC   Toileting- Clothing Manipulation and Hygiene: Total assistance;Sit to/from stand;+2 for physical assistance         General ADL Comments: Pt assisted up to EOB. Noted sling slid too far back on L UE so assisted to position sling better for more support. Pt not wanting therapist to tighten waist strap but explained pt needs support for L UE especially when up OOB. Pt agreeable to let OT tighten waist strap. Encouraged pt to move L digits frequently. Assisted pt to [facility] and back to bed. Pt limited by 10/10 pain when up moving. Not able to let go of theapist to assist with hygiene so requires total assist with 2 people for pericare hygiene (one to assist with hygiene and one for standing support)      Vision     Perception     Praxis      Pertinent Vitals/Pain Pain Assessment: 0-10 Pain Score: 10-Worst pain ever Pain Location: 8 at rest. 10 with activity.  Pain Descriptors / Indicators: Grimacing Pain Intervention(s): Repositioned;Other (comment) (informed nursing)     Hand Dominance Right   Extremity/Trunk Assessment Upper Extremity Assessment Upper Extremity Assessment: LUE deficits/detail LUE: Unable to fully assess due to immobilization           Communication Communication Communication: No difficulties   Cognition Arousal/Alertness: Awake/alert Behavior During Therapy:  WFL for tasks assessed/performed Overall Cognitive Status: Within Functional Limits for tasks assessed                     General Comments       Exercises       Shoulder Instructions      Home Living Family/patient expects to be discharged to:: Skilled nursing facility Living Arrangements: Children Available Help at Discharge: Family Type of Home: House Home Access: Level entry     Home Layout: Two level;Able to live on main level with  bedroom/bathroom     Bathroom Shower/Tub: Occupational psychologist: Standard     Home Equipment: Bedside commode;Walker - 2 wheels;Walker - 4 wheels;Cane - single point;Shower seat;Crutches          Prior Functioning/Environment Level of Independence: Independent             OT Diagnosis: Generalized weakness;Acute pain   OT Problem List: Decreased strength;Decreased knowledge of use of DME or AE;Pain;Decreased activity tolerance   OT Treatment/Interventions: Self-care/ADL training;Patient/family education;Therapeutic activities;DME and/or AE instruction    OT Goals(Current goals can be found in the care plan section) Acute Rehab OT Goals Patient Stated Goal: I know I need to go to rehab. OT Goal Formulation: With patient Time For Goal Achievement: 05/05/15 Potential to Achieve Goals: Good  OT Frequency: Min 2X/week   Barriers to D/C:            Co-evaluation              End of Session Equipment Utilized During Treatment: Gait belt;Other (comment) (L UE immobilizer)  Activity Tolerance: Patient limited by pain Patient left: in bed;with call bell/phone within reach   Time: 6301-6010 OT Time Calculation (min): 31 min Charges:  OT General Charges $OT Visit: 1 Procedure OT Evaluation $Initial OT Evaluation Tier I: 1 Procedure G-Codes:    Jules Schick  [phone] 04/28/2015, 12:42 PM

## 2015-04-28 NOTE — Progress Notes (Signed)
Pt stable Plan for dc am Keep arm in sling

## 2015-04-28 NOTE — Clinical Social Work Note (Signed)
Clinical Social Work Assessment  Patient Details  Name: [name] MRN: [id number] Date of Birth: 03/15/1934  Date of referral:  04/28/15               Reason for consult:  Discharge Planning, Facility Placement                Permission sought to share information with:    Permission granted to share information::     Name::        Agency::     Relationship::     Contact Information:     Housing/Transportation Living arrangements for the past 2 months:  Single Family Home Source of Information:  Patient Patient Interpreter Needed:  None Criminal Activity/Legal Involvement Pertinent to Current Situation/Hospitalization:    Significant Relationships:  Adult Children Lives with:    Do you feel safe going back to the place where you live?   (SNF placement required.) Need for family participation in patient care:  No (Coment)  Care giving concerns: Pt feels ST Rehab is needed.   Social Worker assessment / plan:  Pt was admitted on 04/26/15 with a left humeral fx. PT/ OT have recommended ST Rehab following hospital d/c. CSW met with pt to assist with d/c planning. Pt is in agreement with rehab placement.SNF search has been initiated and bed offers are pending.  Employment status:  Retired Insurance information:    PT Recommendations:  Skilled Nursing Facility Information / Referral to community resources:  Skilled Nursing Facility  Patient/Family's Response to care:  Pt does not feel she can manage at home due to fx.   Patient/Family's Understanding of and Emotional Response to Diagnosis, Current Treatment, and Prognosis:  Pt has been to [facility] in the past and has reported she had a good experience at that facility. Pt is hopeful that [facility] will have an opening for her. Emotional Assessment Appearance:  Appears stated age Attitude/Demeanor/Rapport:   (cooperative) Affect (typically observed):   (uncomfortable due to pain) Orientation:  Oriented to Self,  Oriented to Place, Oriented to  Time, Oriented to Situation Alcohol / Substance use:    Psych involvement (Current and /or in the community):     Discharge Needs  Concerns to be addressed:  Discharge Planning Concerns Readmission within the last 30 days:  No Current discharge risk:  None Barriers to Discharge:  No Barriers Identified   Haidinger, Jamie Lee, LCSW 04/28/2015, 3:21 PM  

## 2015-04-28 NOTE — Progress Notes (Signed)
PATIENT DETAILS Name: [name] Age: 81 y.o. Sex: female Date of Birth: 15-Jun-1934 Admit Date: 04/26/2015 Admitting Physician Oswald Hillock, MD [id number], Malka So, MD  Subjective: Anxious and mildly short of breath this morning.  Assessment/Plan: Active Problems:   Left Proximal humerus fracture: Secondary to a mechanical fall. Denied syncopal episode. Seen by orthopedics, underwent closed reduction of the left proximal humerus fracture. Recommendations from orthopedics are to continue sling for 3 weeks before initiating range of motion exercises. Continue supportive care with as needed narcotics    Left thumb proximal phalanx fracture: Discussed with orthopedics, recommendations are for splint immobilization.    Anemia: Has history of chronic anemia, hemoglobin slightly decreased because of acute illness. Hemoglobin stable at 8.2 this morning. Follow periodically. No overt bleeding.    Hypokalemia: Resolved    COPD on nocturnal oxygen: Mild Wheezing this morning, change nebs to every 6 hours, one extra dose of Lasix today. Follow.    Chronic respiratory failure: Secondary to COPD. Follow.    Chronic diastolic heart failure: Appears to indicate compensated. Continue Lasix, atenolol and lisinopril. Follow closely.    Hypertension: BP controlled. Continue amlodipine, atenolol, lisinopril and Lasix. Following adjust accordingly.    History of adenocarcinoma of the Gastro- esophageal junction: Completed chemotherapy/radiation. Continue PPI.    History of osteoarthritis/fibromyalgia: On chronic low-dose prednisone. Continue.  Disposition: Remain inpatient-suspect may require SNF on discharge.   Antimicrobial agents  See below  Anti-infectives    None      DVT Prophylaxis: Prophylactic Lovenox   Code Status: Full code  Family Communication None at bedside  Procedures: 5/21-closed reduction of the left proximal humerus fracture  CONSULTS:  orthopedic surgery  Time spent 35 minutes-Greater than 50% of this time was spent in counseling, explanation of diagnosis, planning of further management, and coordination of care.  MEDICATIONS: Scheduled Meds: . acetaminophen  1,000 mg Oral TID  . amLODipine  2.5 mg Oral q morning - 10a  . aspirin EC  81 mg Oral q morning - 10a  . atenolol  50 mg Oral BID  . enoxaparin (LOVENOX) injection  30 mg Subcutaneous Q24H  . furosemide  20 mg Oral q morning - 10a  . guaiFENesin  600 mg Oral BID  . ipratropium-albuterol  3 mL Nebulization Q6H  . lisinopril  40 mg Oral QPM  . loratadine  10 mg Oral q morning - 10a  . pantoprazole  40 mg Oral QPC supper  . polyethylene glycol  17 g Oral Daily  . predniSONE  2 mg Oral Q breakfast  . senna  1 tablet Oral QHS   Continuous Infusions:  PRN Meds:.albuterol, diazepam, diphenhydrAMINE, HYDROmorphone (DILAUDID) injection, ketorolac, LORazepam, metoCLOPramide **OR** metoCLOPramide (REGLAN) injection, ondansetron **OR** ondansetron (ZOFRAN) IV, oxyCODONE, polyvinyl alcohol, tiZANidine    PHYSICAL EXAM: Vital signs in last 24 hours: Filed Vitals:   04/28/15 0843 04/28/15 0905 04/28/15 1120 04/28/15 1338  BP: 156/87   136/66  Pulse: 88   83  Temp:    98.3 F (36.8 C)  TempSrc:    Oral  Resp:    20  Height:      Weight:      SpO2:  95% 87%     Weight change:  Filed Weights   04/27/15 2156  Weight: 63.504 kg (140 lb)   Body mass index is 24.81 kg/(m^2).   Gen Exam: Awake and alert with clear speech.  Neck: Supple, No JVD.   Chest: B/L Clear.   CVS: S1 S2 Regular, no murmurs.  Abdomen: soft, BS +, non tender, non distended.  Extremities: no edema, lower extremities warm to touch. Left upper extremity in sling. Neurologic: Non Focal.   Skin: No Rash.   Wounds: N/A.   Intake/Output from previous day:  Intake/Output Summary (Last 24 hours) at 04/28/15 1455 Last data filed at 04/28/15 1300  Gross per 24 hour  Intake    610 ml    Output    750 ml  Net   -140 ml     LAB RESULTS: CBC  Recent Labs Lab 04/26/15 1355 04/27/15 0733 04/28/15 0515  WBC 11.9* 7.9 10.1  HGB 9.7* 7.9* 8.2*  HCT 32.2* 27.1* 27.9*  PLT 343 260 272  MCV 89.2 91.2 91.5  MCH 26.9 26.6 26.9  MCHC 30.1 29.2* 29.4*  RDW 15.4 15.8* 15.9*    Chemistries   Recent Labs Lab 04/26/15 1355 04/27/15 0733 04/28/15 0515  NA 142  --  141  K 3.0*  --  3.7  CL 104  --  106  CO2 27  --  27  GLUCOSE 121*  --  112*  BUN 15  --  17  CREATININE 0.75 0.83 0.94  CALCIUM 9.2  --  9.3    CBG: No results for input(s): GLUCAP in the last 168 hours.  GFR Estimated Creatinine Clearance: 44.2 mL/min (by C-G formula based on Cr of 0.94).  Coagulation profile No results for input(s): INR, PROTIME in the last 168 hours.  Cardiac Enzymes No results for input(s): CKMB, TROPONINI, MYOGLOBIN in the last 168 hours.  Invalid input(s): CK  Invalid input(s): POCBNP No results for input(s): DDIMER in the last 72 hours. No results for input(s): HGBA1C in the last 72 hours. No results for input(s): CHOL, HDL, LDLCALC, TRIG, CHOLHDL, LDLDIRECT in the last 72 hours. No results for input(s): TSH, T4TOTAL, T3FREE, THYROIDAB in the last 72 hours.  Invalid input(s): FREET3 No results for input(s): VITAMINB12, FOLATE, FERRITIN, TIBC, IRON, RETICCTPCT in the last 72 hours. No results for input(s): LIPASE, AMYLASE in the last 72 hours.  Urine Studies No results for input(s): UHGB, CRYS in the last 72 hours.  Invalid input(s): UACOL, UAPR, USPG, UPH, UTP, UGL, UKET, UBIL, UNIT, UROB, ULEU, UEPI, UWBC, URBC, UBAC, CAST, UCOM, BILUA  MICROBIOLOGY: Recent Results (from the past 240 hour(s))  Surgical pcr screen     Status: Abnormal   Collection Time: 04/26/15  5:14 PM  Result Value Ref Range Status   MRSA, PCR NEGATIVE NEGATIVE Final   Staphylococcus aureus POSITIVE (A) NEGATIVE Final    Comment:        The Xpert SA Assay (FDA approved for NASAL  specimens in patients over 86 years of age), is one component of a comprehensive surveillance program.  Test performance has been validated by [facility] for patients greater than or equal to 2 year old. It is not intended to diagnose infection nor to guide or monitor treatment.     RADIOLOGY STUDIES/RESULTS: Dg Chest 1 View  04/26/2015   CLINICAL DATA:  Patient slipped and fell in the bathroom at home, injuring the left shoulder. Initial encounter.  EXAM: CHEST  1 VIEW  COMPARISON:  01/08/2015 and earlier.  FINDINGS: Cardiac silhouette upper normal in size, unchanged. Thoracic aorta atherosclerotic, unchanged. Right perihilar oval-shaped opacity, shown on prior CT to represent focal dilation of the intralobar pulmonary artery. Hilar and mediastinal contours otherwise  unremarkable. Mildly prominent bronchovascular markings diffusely, unchanged. Minimal linear scarring in the left lung base, unchanged. No new pulmonary parenchymal abnormalities. No visible pleural effusions.  IMPRESSION: No acute cardiopulmonary disease.   Electronically Signed   By: Evangeline Dakin M.D.   On: 04/26/2015 12:56   Ct Shoulder Left Wo Contrast  04/26/2015   CLINICAL DATA:  Displaced left proximal humeral fracture.  EXAM: CT OF THE LEFT SHOULDER WITHOUT CONTRAST  TECHNIQUE: Multidetector CT imaging was performed according to the standard protocol. Multiplanar CT image reconstructions were also generated.  COMPARISON:  04/26/2015.  FINDINGS: Highly displaced surgical neck fracture of the left proximal humerus with 1.2 cm overlap, 3.1 cm anterior displacement of the distal fracture fragment, and 2.6 cm medial displacement of the distal fracture fragment. Accordingly, the metaphysis and shaft are significantly anteromedial to the humeral head, in the fractured surfaces are not in direct contact. The humeral head continues to articulate with the glenoid in the expected fashion.  There is a fracture the greater tuberosity  but this is nondisplaced. No lesser tuberosity fracture fragment. I do not observe a scapular fracture or an adjacent rib fracture. T5 compression fracture may well be chronic. T6, T8, and T9 thoracic compression fractures with vertebral augmentations.  Atherosclerotic aortic arch.  IMPRESSION: 1. Two part surgical neck fracture left proximal humerus with the metaphysis/shaft fragment displaced 3.1 cm anterior and 2.6 cm medially with respect to the femoral head, which continues to articulate with the glenoid. There is 1.2 cm of overlap and the fractured surfaces are not contiguous. Please note that although this is only at 2 part fracture, the current degree of displacement is striking. 2. There is a nondisplaced fracture the greater tuberosity. Because this is nondisplaced, it does not make this fracture a 3 part fracture. 3. T5 compression fracture, probably chronic. Old T6, T8, and T9 thoracic compression fractures with vertebral augmentations.   Electronically Signed   By: Van Clines M.D.   On: 04/26/2015 16:32   Dg Shoulder Left  04/27/2015   CLINICAL DATA:  Closed manipulation of left humerus dislocation.  EXAM: LEFT SHOULDER - 2+ VIEW; DG C-ARM 1-60 MIN - NRPT [facility]  COMPARISON:  Preoperative radiographs and CT.  FINDINGS: Two fluoroscopic spot images of the left shoulder during closed manipulation demonstrates improved alignment of the proximal humerus fracture. Fluoroscopy time reported 6 seconds.  IMPRESSION: Intraoperative fluoroscopy during close manipulation of left proximal humerus fracture, the fracture is in improved alignment.   Electronically Signed   By: Jeb Levering M.D.   On: 04/27/2015 03:29   Dg Shoulder Left  04/26/2015   CLINICAL DATA:  Patient status post fall with left shoulder pain. History of cancer.  EXAM: LEFT SHOULDER - 2+ VIEW  COMPARISON:  Chest radiograph 04/26/2015; 07/25/2014  FINDINGS: There is a markedly comminuted fracture of the proximal left humerus  involving the left humeral head. The humeral head appears to be located on limited views however the proximal left humeral diaphysis appears to be medially dislocated. There is contour abnormality of the mid aspect of the left clavicle which is likely secondary to old healed clavicle fracture. Kyphoplasty material within the visualized thoracic spine. Multiple soft tissue calcifications.  IMPRESSION: Comminuted proximal left humerus fracture. The proximal humeral diaphysis appears to be dislocated medially to the humeral head.  Likely chronic deformity of the mid body of the left clavicle, potentially secondary to old healed fracture. Recommend correlation with dedicated clavicle views.   Electronically Signed   By:  Lovey Newcomer M.D.   On: 04/26/2015 12:53   Dg Shoulder Left Port  04/27/2015   CLINICAL DATA:  Proximal left humerus fracture postreduction.  EXAM: LEFT SHOULDER - 1 VIEW  COMPARISON:  Pre reduction views 1 day prior.  FINDINGS: Improved alignment of the comminuted displaced proximal humerus fracture with persistent but improved displacement of the humeral diaphysis. Glenohumeral alignment is suboptimally assessed, however humeral head is likely normally located. Soft tissue calcifications are again seen.  IMPRESSION: Improved alignment of comminuted displaced proximal humerus fracture. The glenohumeral alignment is suboptimally assessed, however humeral head is likely normally located.   Electronically Signed   By: Jeb Levering M.D.   On: 04/27/2015 02:18   Dg Hand Complete Left  04/26/2015   CLINICAL DATA:  Left hand pain after fall today. History of arthritis.  EXAM: LEFT HAND - COMPLETE 3+ VIEW  COMPARISON:  None.  FINDINGS: Advanced degenerative changes in the first carpometacarpal joint and IP joints diffusely. Joint space narrowing in the in the CP joints. There is a fracture through the proximal phalanx of the left thumb with mild angulation. No additional fracture noted.  IMPRESSION:  Fracture through the base of the left thumb proximal phalanx with mild angulation.   Electronically Signed   By: Rolm Baptise M.D.   On: 04/26/2015 18:19   Dg C-arm 1-60 Min-no Report  04/27/2015   CLINICAL DATA: left humerus dislocation   C-ARM 1-60 MINUTES  Fluoroscopy was utilized by the requesting physician.  No radiographic  interpretation.     Oren Binet, MD  [facility] Pager:[phone]  If 7PM-7AM, please contact night-coverage [email or web address] Password [email or web address] 04/28/2015, 2:55 PM   LOS: 2 days

## 2015-04-28 NOTE — Care Management Note (Signed)
Case Management Note  Patient Details  Name: [name] MRN: [id number] Date of Birth: 1934-09-07  Subjective/Objective:             Fracture of humeral head, hgb 7.9 post op iv fld replacement and labs       Action/Plan: snf  Expected Discharge Date:                  Expected Discharge Plan:  Skilled Nursing Facility  In-House Referral:  Clinical Social Work  Discharge planning Services  CM Consult  Post Acute Care Choice:  NA Choice offered to:  NA  DME Arranged:    DME Agency:     HH Arranged:    [facility] Agency:     Status of Service:     Medicare Important Message Given:    Date Medicare IM Given:    Medicare IM give by:    Date Additional Medicare IM Given:    Additional Medicare Important Message give by:     If discussed at [place] of Stay Meetings, dates discussed:    Additional Comments:  Leeroy Cha, RN 04/28/2015, 2:38 PM

## 2015-04-29 ENCOUNTER — Inpatient Hospital Stay (HOSPITAL_COMMUNITY): Payer: Medicare Other

## 2015-04-29 DIAGNOSIS — D638 Anemia in other chronic diseases classified elsewhere: Secondary | ICD-10-CM

## 2015-04-29 DIAGNOSIS — I952 Hypotension due to drugs: Secondary | ICD-10-CM

## 2015-04-29 DIAGNOSIS — S42202A Unspecified fracture of upper end of left humerus, initial encounter for closed fracture: Secondary | ICD-10-CM

## 2015-04-29 LAB — CBC
HEMATOCRIT: 27.5 % — AB (ref 36.0–46.0)
HEMOGLOBIN: 8.2 g/dL — AB (ref 12.0–15.0)
MCH: 26.9 pg (ref 26.0–34.0)
MCHC: 29.8 g/dL — AB (ref 30.0–36.0)
MCV: 90.2 fL (ref 78.0–100.0)
PLATELETS: 308 10*3/uL (ref 150–400)
RBC: 3.05 MIL/uL — ABNORMAL LOW (ref 3.87–5.11)
RDW: 15.8 % — ABNORMAL HIGH (ref 11.5–15.5)
WBC: 7 10*3/uL (ref 4.0–10.5)

## 2015-04-29 LAB — BASIC METABOLIC PANEL
Anion gap: 9 (ref 5–15)
BUN: 21 mg/dL — AB (ref 6–20)
CHLORIDE: 107 mmol/L (ref 101–111)
CO2: 25 mmol/L (ref 22–32)
Calcium: 8.8 mg/dL — ABNORMAL LOW (ref 8.9–10.3)
Creatinine, Ser: 0.95 mg/dL (ref 0.44–1.00)
GFR calc Af Amer: >60 mL/min (ref >60)
GFR calc non Af Amer: 56 mL/min — ABNORMAL LOW (ref >60)
Glucose, Bld: 114 mg/dL — ABNORMAL HIGH (ref 65–99)
Potassium: 3.7 mmol/L (ref 3.5–5.1)
SODIUM: 141 mmol/L (ref 135–145)

## 2015-04-29 LAB — BRAIN NATRIURETIC PEPTIDE: B Natriuretic Peptide: 707.5 pg/mL — ABNORMAL HIGH (ref 0.0–100.0)

## 2015-04-29 MED ORDER — LISINOPRIL 20 MG PO TABS
20.0000 mg | ORAL_TABLET | Freq: Every evening | ORAL | Status: DC
  Administered 2015-04-29: 20 mg via ORAL
  Filled 2015-04-29 (×2): qty 1

## 2015-04-29 MED ORDER — POLYETHYLENE GLYCOL 3350 17 G PO PACK: 17.0000 g | PACK | Freq: Every day | ORAL | Status: DC

## 2015-04-29 MED ORDER — METHYLPREDNISOLONE SODIUM SUCC 125 MG IJ SOLR
60.0000 mg | Freq: Once | INTRAMUSCULAR | Status: AC
  Administered 2015-04-29: 60 mg via INTRAVENOUS
  Filled 2015-04-29: qty 0.96

## 2015-04-29 MED ORDER — ALBUTEROL SULFATE HFA 108 (90 BASE) MCG/ACT IN AERS: 2.0000 | INHALATION_SPRAY | RESPIRATORY_TRACT | Status: AC | PRN

## 2015-04-29 MED ORDER — SENNA 8.6 MG PO TABS: 1.0000 | ORAL_TABLET | Freq: Every day | ORAL | Status: AC

## 2015-04-29 MED ORDER — TIZANIDINE HCL 4 MG PO TABS: 2.0000 mg | ORAL_TABLET | Freq: Three times a day (TID) | ORAL | Status: AC | PRN

## 2015-04-29 MED ORDER — ACETAMINOPHEN 500 MG PO TABS: 1000.0000 mg | ORAL_TABLET | Freq: Three times a day (TID) | ORAL | Status: DC

## 2015-04-29 MED ORDER — ATENOLOL 50 MG PO TABS
50.0000 mg | ORAL_TABLET | Freq: Every day | ORAL | Status: DC
  Administered 2015-04-30: 50 mg via ORAL
  Filled 2015-04-29: qty 1

## 2015-04-29 MED ORDER — FUROSEMIDE 20 MG PO TABS
20.0000 mg | ORAL_TABLET | Freq: Every morning | ORAL | Status: DC
  Administered 2015-04-30: 20 mg via ORAL
  Filled 2015-04-29: qty 1

## 2015-04-29 MED ORDER — GUAIFENESIN ER 600 MG PO TB12: 600.0000 mg | ORAL_TABLET | Freq: Two times a day (BID) | ORAL | Status: AC

## 2015-04-29 MED ORDER — LORAZEPAM 0.5 MG PO TABS
0.5000 mg | ORAL_TABLET | Freq: Three times a day (TID) | ORAL | Status: DC | PRN
Start: 2015-04-29 — End: 2015-05-20

## 2015-04-29 MED ORDER — OXYCODONE HCL 5 MG PO TABS: 5.0000 mg | ORAL_TABLET | ORAL | Status: DC | PRN

## 2015-04-29 MED ORDER — SODIUM CHLORIDE 0.9 % IV BOLUS (SEPSIS)
250.0000 mL | Freq: Once | INTRAVENOUS | Status: AC
  Administered 2015-04-29: 250 mL via INTRAVENOUS

## 2015-04-29 MED ORDER — ACETAMINOPHEN 325 MG PO TABS
650.0000 mg | ORAL_TABLET | ORAL | Status: AC
  Administered 2015-04-29: 650 mg via ORAL
  Filled 2015-04-29: qty 2

## 2015-04-29 MED ORDER — IPRATROPIUM-ALBUTEROL 0.5-2.5 (3) MG/3ML IN SOLN: 3.0000 mL | Freq: Four times a day (QID) | RESPIRATORY_TRACT | Status: AC

## 2015-04-29 MED ORDER — ALBUTEROL SULFATE (2.5 MG/3ML) 0.083% IN NEBU: 2.5000 mg | INHALATION_SOLUTION | RESPIRATORY_TRACT | Status: AC | PRN

## 2015-04-29 MED ORDER — BENZONATATE 100 MG PO CAPS
200.0000 mg | ORAL_CAPSULE | Freq: Three times a day (TID) | ORAL | Status: DC | PRN
  Administered 2015-04-29 – 2015-04-30 (×3): 200 mg via ORAL
  Filled 2015-04-29 (×3): qty 2

## 2015-04-29 NOTE — Progress Notes (Signed)
PATIENT DETAILS Name: [name] Age: 81 y.o. Sex: female Date of Birth: 03-Aug-1934 Admit Date: 04/26/2015 Admitting Physician Oswald Hillock, MD FVC:[id number], Malka So, MD  Subjective: Mildly hypotensive this am-but breathing better  Assessment/Plan: Active Problems:   Left Proximal humerus fracture: Secondary to a mechanical fall. Denied syncopal episode. Seen by orthopedics, underwent closed reduction of the left proximal humerus fracture. Recommendations from orthopedics are to continue sling for 3 weeks before initiating range of motion exercises. Continue supportive care with as needed narcotics. SNF on discharge.    Left thumb proximal phalanx fracture: Discussed with orthopedics, recommendations are for splint immobilization.    Transient hypotension:? Etiology. Chronic prednisone-Will give stress dose to weights with one dose of 60 mg Solu-Medrol, hold all antihypertensives today. Is asymptomatic. We'll closely monitor. Check CBC, chemistries and chest x-ray.    Anemia: Has history of chronic anemia, hemoglobin slightly decreased because of acute illness. Hemoglobin stable at 8.2 this morning. Follow periodically. No overt bleeding.    Hypokalemia: Resolved    COPD on nocturnal oxygen: Minimal Wheezing this morning, change nebs to every 6 hours, one extra dose of Lasix today. Follow.    Chronic respiratory failure: Secondary to COPD. Follow.    Chronic diastolic heart failure: Appears to be compensated. Continue Lasix, atenolol and lisinopril-but with holding parameters.. Follow closely.    Hypertension: Hypotensive this morning-old all antihypertensives today-cautiously continue once BP better. Holding parameters placed. See above.     History of adenocarcinoma of the Gastro- esophageal junction: Completed chemotherapy/radiation. Continue PPI.    History of osteoarthritis/fibromyalgia: On chronic low-dose prednisone. Continue.  Disposition: Remain SNF  on 5/25 BP stable  Antimicrobial agents  See below  Anti-infectives    None      DVT Prophylaxis: Prophylactic Lovenox   Code Status: Full code  Family Communication None at bedside  Procedures: 5/21-closed reduction of the left proximal humerus fracture  CONSULTS:  orthopedic surgery  Time spent 35 minutes-Greater than 50% of this time was spent in counseling, explanation of diagnosis, planning of further management, and coordination of care.  MEDICATIONS: Scheduled Meds: . acetaminophen  1,000 mg Oral TID  . amLODipine  2.5 mg Oral q morning - 10a  . aspirin EC  81 mg Oral q morning - 10a  . [START ON 04/30/2015] atenolol  50 mg Oral Daily  . enoxaparin (LOVENOX) injection  30 mg Subcutaneous Q24H  . [START ON 04/30/2015] furosemide  20 mg Oral q morning - 10a  . guaiFENesin  600 mg Oral BID  . ipratropium-albuterol  3 mL Nebulization Q6H  . lisinopril  20 mg Oral QPM  . loratadine  10 mg Oral q morning - 10a  . pantoprazole  40 mg Oral QPC supper  . polyethylene glycol  17 g Oral Daily  . predniSONE  2 mg Oral Q breakfast  . senna  1 tablet Oral QHS   Continuous Infusions:  PRN Meds:.albuterol, benzonatate, diazepam, diphenhydrAMINE, HYDROmorphone (DILAUDID) injection, ketorolac, LORazepam, metoCLOPramide **OR** metoCLOPramide (REGLAN) injection, ondansetron **OR** ondansetron (ZOFRAN) IV, oxyCODONE, polyvinyl alcohol, tiZANidine    PHYSICAL EXAM: Vital signs in last 24 hours: Filed Vitals:   04/29/15 0931 04/29/15 0932 04/29/15 1042 04/29/15 1342  BP: 89/46 89/46 92/45  124/62  Pulse:  79 75 80  Temp:   98.1 F (36.7 C) 98 F (36.7 C)  TempSrc:   Oral Axillary  Resp:   16 18  Height:      Weight:      SpO2:   98% 97%    Weight change:  Filed Weights   04/27/15 2156  Weight: 63.504 kg (140 lb)   Body mass index is 24.81 kg/(m^2).   Gen Exam: Awake and alert with clear speech.  Neck: Supple, No JVD.   Chest: B/L Clear-only a few scattered  rhonchi CVS: S1 S2 Regular, no murmurs.  Abdomen: soft, BS +, non tender, non distended.  Extremities: no edema, lower extremities warm to touch. Left upper extremity in sling. Neurologic: Non Focal.   Skin: No Rash.   Wounds: N/A.   Intake/Output from previous day:  Intake/Output Summary (Last 24 hours) at 04/29/15 1446 Last data filed at 04/29/15 1343  Gross per 24 hour  Intake    960 ml  Output    425 ml  Net    535 ml     LAB RESULTS: CBC  Recent Labs Lab 04/26/15 1355 04/27/15 0733 04/28/15 0515 04/29/15 1043  WBC 11.9* 7.9 10.1 7.0  HGB 9.7* 7.9* 8.2* 8.2*  HCT 32.2* 27.1* 27.9* 27.5*  PLT 343 260 272 308  MCV 89.2 91.2 91.5 90.2  MCH 26.9 26.6 26.9 26.9  MCHC 30.1 29.2* 29.4* 29.8*  RDW 15.4 15.8* 15.9* 15.8*    Chemistries   Recent Labs Lab 04/26/15 1355 04/27/15 0733 04/28/15 0515 04/29/15 1105  NA 142  --  141 141  K 3.0*  --  3.7 3.7  CL 104  --  106 107  CO2 27  --  27 25  GLUCOSE 121*  --  112* 114*  BUN 15  --  17 21*  CREATININE 0.75 0.83 0.94 0.95  CALCIUM 9.2  --  9.3 8.8*    CBG: No results for input(s): GLUCAP in the last 168 hours.  GFR Estimated Creatinine Clearance: 43.8 mL/min (by C-G formula based on Cr of 0.95).  Coagulation profile No results for input(s): INR, PROTIME in the last 168 hours.  Cardiac Enzymes No results for input(s): CKMB, TROPONINI, MYOGLOBIN in the last 168 hours.  Invalid input(s): CK  Invalid input(s): POCBNP No results for input(s): DDIMER in the last 72 hours. No results for input(s): HGBA1C in the last 72 hours. No results for input(s): CHOL, HDL, LDLCALC, TRIG, CHOLHDL, LDLDIRECT in the last 72 hours. No results for input(s): TSH, T4TOTAL, T3FREE, THYROIDAB in the last 72 hours.  Invalid input(s): FREET3 No results for input(s): VITAMINB12, FOLATE, FERRITIN, TIBC, IRON, RETICCTPCT in the last 72 hours. No results for input(s): LIPASE, AMYLASE in the last 72 hours.  Urine Studies No  results for input(s): UHGB, CRYS in the last 72 hours.  Invalid input(s): UACOL, UAPR, USPG, UPH, UTP, UGL, UKET, UBIL, UNIT, UROB, ULEU, UEPI, UWBC, URBC, UBAC, CAST, UCOM, BILUA  MICROBIOLOGY: Recent Results (from the past 240 hour(s))  Surgical pcr screen     Status: Abnormal   Collection Time: 04/26/15  5:14 PM  Result Value Ref Range Status   MRSA, PCR NEGATIVE NEGATIVE Final   Staphylococcus aureus POSITIVE (A) NEGATIVE Final    Comment:        The Xpert SA Assay (FDA approved for NASAL specimens in patients over 30 years of age), is one component of a comprehensive surveillance program.  Test performance has been validated by [facility] for patients greater than or equal to 45 year old. It is not intended to diagnose infection nor to guide or monitor treatment.  RADIOLOGY STUDIES/RESULTS: Dg Chest 1 View  04/26/2015   CLINICAL DATA:  Patient slipped and fell in the bathroom at home, injuring the left shoulder. Initial encounter.  EXAM: CHEST  1 VIEW  COMPARISON:  01/08/2015 and earlier.  FINDINGS: Cardiac silhouette upper normal in size, unchanged. Thoracic aorta atherosclerotic, unchanged. Right perihilar oval-shaped opacity, shown on prior CT to represent focal dilation of the intralobar pulmonary artery. Hilar and mediastinal contours otherwise unremarkable. Mildly prominent bronchovascular markings diffusely, unchanged. Minimal linear scarring in the left lung base, unchanged. No new pulmonary parenchymal abnormalities. No visible pleural effusions.  IMPRESSION: No acute cardiopulmonary disease.   Electronically Signed   By: Evangeline Dakin M.D.   On: 04/26/2015 12:56   Ct Shoulder Left Wo Contrast  04/26/2015   CLINICAL DATA:  Displaced left proximal humeral fracture.  EXAM: CT OF THE LEFT SHOULDER WITHOUT CONTRAST  TECHNIQUE: Multidetector CT imaging was performed according to the standard protocol. Multiplanar CT image reconstructions were also generated.   COMPARISON:  04/26/2015.  FINDINGS: Highly displaced surgical neck fracture of the left proximal humerus with 1.2 cm overlap, 3.1 cm anterior displacement of the distal fracture fragment, and 2.6 cm medial displacement of the distal fracture fragment. Accordingly, the metaphysis and shaft are significantly anteromedial to the humeral head, in the fractured surfaces are not in direct contact. The humeral head continues to articulate with the glenoid in the expected fashion.  There is a fracture the greater tuberosity but this is nondisplaced. No lesser tuberosity fracture fragment. I do not observe a scapular fracture or an adjacent rib fracture. T5 compression fracture may well be chronic. T6, T8, and T9 thoracic compression fractures with vertebral augmentations.  Atherosclerotic aortic arch.  IMPRESSION: 1. Two part surgical neck fracture left proximal humerus with the metaphysis/shaft fragment displaced 3.1 cm anterior and 2.6 cm medially with respect to the femoral head, which continues to articulate with the glenoid. There is 1.2 cm of overlap and the fractured surfaces are not contiguous. Please note that although this is only at 2 part fracture, the current degree of displacement is striking. 2. There is a nondisplaced fracture the greater tuberosity. Because this is nondisplaced, it does not make this fracture a 3 part fracture. 3. T5 compression fracture, probably chronic. Old T6, T8, and T9 thoracic compression fractures with vertebral augmentations.   Electronically Signed   By: Van Clines M.D.   On: 04/26/2015 16:32   Dg Chest Port 1 View  04/29/2015   CLINICAL DATA:  Patient with recent diagnosis of pneumonia.  EXAM: PORTABLE CHEST - 1 VIEW  COMPARISON:  Chest radiograph 04/26/2015; 01/08/2015  FINDINGS: Enlarged cardiac and mediastinal contours. Bilateral predominately perihilar interstitial pulmonary opacities. Layering bilateral pleural effusions and underlying pulmonary consolidation. Re-  demonstrated comminuted fracture through the proximal left humerus.  IMPRESSION: Cardiomegaly with interstitial pulmonary opacities favored to represent edema.  Layering bilateral pleural effusions and underlying pulmonary consolidation likely representing atelectasis.   Electronically Signed   By: Lovey Newcomer M.D.   On: 04/29/2015 11:41   Dg Shoulder Left  04/27/2015   CLINICAL DATA:  Closed manipulation of left humerus dislocation.  EXAM: LEFT SHOULDER - 2+ VIEW; DG C-ARM 1-60 MIN - NRPT [facility]  COMPARISON:  Preoperative radiographs and CT.  FINDINGS: Two fluoroscopic spot images of the left shoulder during closed manipulation demonstrates improved alignment of the proximal humerus fracture. Fluoroscopy time reported 6 seconds.  IMPRESSION: Intraoperative fluoroscopy during close manipulation of left proximal humerus fracture, the fracture is  in improved alignment.   Electronically Signed   By: Jeb Levering M.D.   On: 04/27/2015 03:29   Dg Shoulder Left  04/26/2015   CLINICAL DATA:  Patient status post fall with left shoulder pain. History of cancer.  EXAM: LEFT SHOULDER - 2+ VIEW  COMPARISON:  Chest radiograph 04/26/2015; 07/25/2014  FINDINGS: There is a markedly comminuted fracture of the proximal left humerus involving the left humeral head. The humeral head appears to be located on limited views however the proximal left humeral diaphysis appears to be medially dislocated. There is contour abnormality of the mid aspect of the left clavicle which is likely secondary to old healed clavicle fracture. Kyphoplasty material within the visualized thoracic spine. Multiple soft tissue calcifications.  IMPRESSION: Comminuted proximal left humerus fracture. The proximal humeral diaphysis appears to be dislocated medially to the humeral head.  Likely chronic deformity of the mid body of the left clavicle, potentially secondary to old healed fracture. Recommend correlation with dedicated clavicle views.    Electronically Signed   By: Lovey Newcomer M.D.   On: 04/26/2015 12:53   Dg Shoulder Left Port  04/27/2015   CLINICAL DATA:  Proximal left humerus fracture postreduction.  EXAM: LEFT SHOULDER - 1 VIEW  COMPARISON:  Pre reduction views 1 day prior.  FINDINGS: Improved alignment of the comminuted displaced proximal humerus fracture with persistent but improved displacement of the humeral diaphysis. Glenohumeral alignment is suboptimally assessed, however humeral head is likely normally located. Soft tissue calcifications are again seen.  IMPRESSION: Improved alignment of comminuted displaced proximal humerus fracture. The glenohumeral alignment is suboptimally assessed, however humeral head is likely normally located.   Electronically Signed   By: Jeb Levering M.D.   On: 04/27/2015 02:18   Dg Hand Complete Left  04/26/2015   CLINICAL DATA:  Left hand pain after fall today. History of arthritis.  EXAM: LEFT HAND - COMPLETE 3+ VIEW  COMPARISON:  None.  FINDINGS: Advanced degenerative changes in the first carpometacarpal joint and IP joints diffusely. Joint space narrowing in the in the CP joints. There is a fracture through the proximal phalanx of the left thumb with mild angulation. No additional fracture noted.  IMPRESSION: Fracture through the base of the left thumb proximal phalanx with mild angulation.   Electronically Signed   By: Rolm Baptise M.D.   On: 04/26/2015 18:19   Dg C-arm 1-60 Min-no Report  04/27/2015   CLINICAL DATA: left humerus dislocation   C-ARM 1-60 MINUTES  Fluoroscopy was utilized by the requesting physician.  No radiographic  interpretation.     Oren Binet, MD  [facility] Pager:[phone]  If 7PM-7AM, please contact night-coverage [email or web address] Password [email or web address] 04/29/2015, 2:46 PM   LOS: 3 days

## 2015-04-29 NOTE — Care Management Note (Signed)
Case Management Note  Patient Details  Name: [name] MRN: [id number] Date of Birth: [birth year]  Subjective/Objective:                   CLOSED MANIPULATION SHOULDER Action/Plan: Discharge planning  Expected Discharge Date:                  Expected Discharge Plan:  [place]  In-House Referral:  Clinical Social Work  Discharge planning Services  CM Consult  Post Acute Care Choice:  NA Choice offered to:  NA  DME Arranged:    DME Agency:     HH Arranged:    [place] Agency:     Status of Service:  Completed, signed off  Medicare Important Message Given:    Date Medicare IM Given:    Medicare IM give by:    Date Additional Medicare IM Given:    Additional Medicare Important Message give by:     If discussed at [place] of Stay Meetings, dates discussed:    Additional Comments: CM gave IM.  CM notes pt to go to SNF; CSW arranging.  No other CM needs were communicated. Dellie Catholic, RN 04/29/2015, 11:59 AM

## 2015-04-29 NOTE — Progress Notes (Signed)
CSW met with pt to provide SNF bed offers. Pt has chosen [facility] for [employer]. SNF bed will be available on WED if pt is ready for d/c.  Werner Lean LCSW [phone]

## 2015-04-30 DIAGNOSIS — S62502S Fracture of unspecified phalanx of left thumb, sequela: Secondary | ICD-10-CM

## 2015-04-30 DIAGNOSIS — I10 Essential (primary) hypertension: Secondary | ICD-10-CM

## 2015-04-30 DIAGNOSIS — E876 Hypokalemia: Secondary | ICD-10-CM

## 2015-04-30 DIAGNOSIS — S42302A Unspecified fracture of shaft of humerus, left arm, initial encounter for closed fracture: Secondary | ICD-10-CM

## 2015-04-30 NOTE — Progress Notes (Signed)
Physical Therapy Treatment Patient Details Name: SALISA BROZ MRN: [id number] DOB: 1934-05-25 Today's Date: 04/30/2015    History of Present Illness Pt is a 81 year old female s/p L proximal humerus fracture and Left thumb proximal phalanx fracture. Pt is s/p closed manipulation of L shoulder.     PT Comments    Pt progressing, requires incr time to complete task and encouragement to participate;   Follow Up Recommendations  SNF     Equipment Recommendations  None recommended by PT    Recommendations for Other Services       Precautions / Restrictions Precautions Precautions: Fall;Shoulder Shoulder Interventions: Shoulder sling/immobilizer Precaution Comments: L thumb spica splint Required Braces or Orthoses: Sling Restrictions Weight Bearing Restrictions: Yes LUE Weight Bearing: Non weight bearing    Mobility  Bed Mobility                  Transfers Overall transfer level: Needs assistance Equipment used: 1 person hand held assist Transfers: Sit to/from Stand Sit to Stand: Min assist         General transfer comment: verbal cues for hand placement.  Ambulation/Gait Ambulation/Gait assistance: Min assist Ambulation Distance (Feet): 50 Feet Assistive device: 1 person hand held assist Gait Pattern/deviations: Step-through pattern;Decreased stride length;Shuffle;Trunk flexed     General Gait Details: cues for step length, posture, relaxing LUE into sling   Stairs            Wheelchair Mobility    Modified Rankin (Stroke Patients Only)       Balance                                    Cognition Arousal/Alertness: Awake/alert Behavior During Therapy: WFL for tasks assessed/performed Overall Cognitive Status: Within Functional Limits for tasks assessed                      Exercises General Exercises - Lower Extremity Ankle Circles/Pumps: AROM;15 reps;Both Quad Sets: AROM;10 reps;Both    General Comments         Pertinent Vitals/Pain Pain Assessment: 0-10 Pain Score: 6  Pain Location: L shoulder;pt reports decr pain after activity and sling repositioned Pain Descriptors / Indicators: Jabbing Pain Intervention(s): Limited activity within patient's tolerance;Monitored during session;Premedicated before session;Repositioned;Relaxation    Home Living                      Prior Function            PT Goals (current goals can now be found in the care plan section) Acute Rehab PT Goals Patient Stated Goal: I know I need to go to rehab. PT Goal Formulation: With patient Time For Goal Achievement: 05/05/15 Potential to Achieve Goals: Good Progress towards PT goals: Progressing toward goals    Frequency  Min 3X/week    PT Plan Current plan remains appropriate    Co-evaluation             End of Session Equipment Utilized During Treatment: Gait belt (shoulder immobiliizer) Activity Tolerance: Patient tolerated treatment well;No increased pain Patient left: in chair;with call bell/phone within reach     Time: 0915-0938 PT Time Calculation (min) (ACUTE ONLY): 23 min  Charges:  $Gait Training: 8-22 mins $Therapeutic Activity: 8-22 mins                    G Codes:  [facility] 04/30/2015, 10:28 AM

## 2015-04-30 NOTE — Discharge Summary (Signed)
Physician Discharge Summary  KYAN GIANNONE BTD:[id number] DOB: 1934-09-09 DOA: 04/26/2015  PCP: Martinique, [name], MD  Admit date: 04/26/2015 Discharge date: 04/30/2015  Recommendations for Outpatient Follow-up:  1. Patient will follow-up with or 2 and primary care physician in one to 2 weeks after discharge, per scheduled appointment.  Discharge Diagnoses:  Active Problems:   Left humeral fracture   Humerus fracture   Proximal humerus fracture    Discharge Condition: stable   Diet recommendation: as tolerated   History of present illness:  81 year old female with past medical history significant for pernicious anemia, fibromyalgia, history of multiple bleeding ulcers in 2012, hypertension, emphysema / COPD on home oxygen, GE junction adenocarcinoma, GERD. Patient presents to [facility] status post mechanical fall at home and falling on the left shoulder. Patient sustained a left proximal humeral fracture and left thumb proximal phalanx fracture.  During this hospital stay, patient underwent closed reduction of the left proximal humeral fracture. In regards to the left thumb proximal phalanx fracture per orthopedic surgery recommendation was for splint immobilization. No subsequent post surgery complications. Patient is medically stable to be discharged to skilled nursing facility.   Hospital Course:   Principal problem: Left Proximal humerus fracture - Secondary to mechanical fall. - Patient underwent closed reduction of the left proximal humerus fracture. - Patient will continue sling for 3 weeks before initiating range of motion exercises - Continue pain management efforts.  Active problems: Left thumb proximal phalanx fracture - Per orthopedic surgery, recommendation is for splint immobilization. - Continue pain management efforts.  Pernicious anemia  - Hemoglobin 8.2, stable  Hypokalemia - Resolved  COPD on nocturnal oxygen - Stable, no acute  exacerbation   Chronic diastolic heart failure - Compensated. Continue Lasix, atenolol and lisinopril.   Hypertension - BP controlled. Continue amlodipine, atenolol, lisinopril and Lasix.   History of adenocarcinoma of the Gastro- esophageal junction - Completed chemotherapy/radiation. Continue PPI.  History of osteoarthritis/fibromyalgia - On chronic low-dose prednisone. - Stable    Signed:  Leisa Lenz, MD  [facility] 04/30/2015, 9:56 AM  Pager #: [phone]  Time spent in minutes: more than 30 minutes   Discharge Exam: Filed Vitals:   04/30/15 0925  BP: 129/64  Pulse: 115  Temp:   Resp:    Filed Vitals:   04/29/15 2115 04/30/15 0623 04/30/15 0734 04/30/15 0925  BP: 100/61 129/60  129/64  Pulse: 68 77  115  Temp: 98.7 F (37.1 C) 97.8 F (36.6 C)    TempSrc: Oral Oral    Resp: 16 16    Height:      Weight:      SpO2: 95% 100% 99%     General: Pt is alert, follows commands appropriately, not in acute distress Cardiovascular: Regular rate and rhythm, S1/S2 (+) Respiratory: Clear to auscultation bilaterally, no wheezing, no crackles, no rhonchi Abdominal: Soft, non tender, non distended, bowel sounds +, no guarding Extremities: no edema, no cyanosis, pulses palpable bilaterally DP and PT; left arm in cast  Neuro: Grossly nonfocal  Discharge Instructions  Discharge Instructions    Call MD / Call 911    Complete by:  As directed   If you experience chest pain or shortness of breath, CALL 911 and be transported to the hospital emergency room.  If you develope a fever above 101 F, pus (white drainage) or increased drainage or redness at the wound, or calf pain, call your surgeon's office.     Call MD for:  difficulty  breathing, headache or visual disturbances    Complete by:  As directed      Call MD for:  difficulty breathing, headache or visual disturbances    Complete by:  As directed      Call MD for:  persistant nausea and vomiting     Complete by:  As directed      Call MD for:  redness, tenderness, or signs of infection (pain, swelling, redness, odor or green/yellow discharge around incision site)    Complete by:  As directed      Call MD for:  severe uncontrolled pain    Complete by:  As directed      Diet - low sodium heart healthy    Complete by:  As directed      Diet - low sodium heart healthy    Complete by:  As directed      Discharge instructions    Complete by:  As directed   Keep left arm in sling at all times Keep right thumb splinted Return to clinic 2 weeks for follow up     Increase activity slowly as tolerated    Complete by:  As directed      Increase activity slowly    Complete by:  As directed      Increase activity slowly    Complete by:  As directed             Medication List    TAKE these medications        acetaminophen 500 MG tablet  Commonly known as:  TYLENOL  Take 2 tablets (1,000 mg total) by mouth 3 (three) times daily.     albuterol 108 (90 BASE) MCG/ACT inhaler  Commonly known as:  PROVENTIL HFA;VENTOLIN HFA  Inhale 2 puffs into the lungs every 4 (four) hours as needed for wheezing (wheezing).     albuterol (2.5 MG/3ML) 0.083% nebulizer solution  Commonly known as:  PROVENTIL  Take 3 mLs (2.5 mg total) by nebulization every 2 (two) hours as needed for wheezing or shortness of breath.     amLODipine 2.5 MG tablet  Commonly known as:  NORVASC  Take 2.5 mg by mouth every morning.     aspirin EC 81 MG tablet  Take 81 mg by mouth every morning.     atenolol 50 MG tablet  Commonly known as:  TENORMIN  Take 50 mg by mouth 2 (two) times daily.     CULTURELLE Caps  Take 1 capsule by mouth every morning.     cyanocobalamin 1000 MCG/ML injection  Commonly known as:  (VITAMIN B-12)  Inject 1,000 mcg into the muscle every 6 (six) weeks.     ergocalciferol 50000 UNITS capsule  Commonly known as:  VITAMIN D2  Take 50,000 Units by mouth every 14 (fourteen) days.      esomeprazole 20 MG capsule  Commonly known as:  NEXIUM  Take 20 mg by mouth daily at 12 noon.     Fluticasone Furoate-Vilanterol 200-25 MCG/INH Aepb  Inhale 2 puffs into the lungs every morning.     furosemide 20 MG tablet  Commonly known as:  LASIX  Take 20 mg by mouth every morning.     guaiFENesin 600 MG 12 hr tablet  Commonly known as:  MUCINEX  Take 1 tablet (600 mg total) by mouth 2 (two) times daily.     hydroxypropyl methylcellulose / hypromellose 2.5 % ophthalmic solution  Commonly known as:  ISOPTO TEARS / GONIOVISC  Place 1 drop into both eyes 3 (three) times daily as needed for dry eyes.     ibuprofen 400 MG tablet  Commonly known as:  ADVIL,MOTRIN  Take 1 tablet (400 mg total) by mouth every 8 (eight) hours as needed (pain).     ipratropium-albuterol 0.5-2.5 (3) MG/3ML Soln  Commonly known as:  DUONEB  Take 3 mLs by nebulization every 6 (six) hours.     lisinopril 40 MG tablet  Commonly known as:  PRINIVIL,ZESTRIL  Take 40 mg by mouth every evening.     loratadine 10 MG tablet  Commonly known as:  CLARITIN  Take 10 mg by mouth every morning.     LORazepam 0.5 MG tablet  Commonly known as:  ATIVAN  Take 1 tablet (0.5 mg total) by mouth every 8 (eight) hours as needed for anxiety.     oxyCODONE 5 MG immediate release tablet  Commonly known as:  Oxy IR/ROXICODONE  Take 1 tablet (5 mg total) by mouth every 4 (four) hours as needed for moderate pain.     polyethylene glycol packet  Commonly known as:  MIRALAX / GLYCOLAX  Take 17 g by mouth daily.     predniSONE 1 MG tablet  Commonly known as:  DELTASONE  Take 2 mg by mouth daily with breakfast.     saccharomyces boulardii 250 MG capsule  Commonly known as:  FLORASTOR  Take 1 capsule (250 mg total) by mouth 2 (two) times daily.     senna 8.6 MG Tabs tablet  Commonly known as:  SENOKOT  Take 1 tablet (8.6 mg total) by mouth at bedtime.     sodium chloride 0.65 % Soln nasal spray  Commonly known as:   OCEAN  Place 1 spray into both nostrils as needed for congestion.     ST JOHNS WORT PO  Take 1 capsule by mouth 2 (two) times daily.     tiZANidine 4 MG tablet  Commonly known as:  ZANAFLEX  Take 0.5-1 tablets (2-4 mg total) by mouth every 8 (eight) hours as needed for muscle spasms.     white petrolatum Gel  Commonly known as:  VASELINE  Apply 1 application topically as needed (anterior nares to prevent nose bleed).           Follow-up Information    Follow up with Martinique, Malka So, MD. Schedule an appointment as soon as possible for a visit in 2 weeks.   Specialty:  Family Medicine   Contact information:   [place] [address] [phone]       Follow up with Meredith Pel, MD. Schedule an appointment as soon as possible for a visit in 1 week.   Specialty:  Orthopedic Surgery   Contact information:   [place] [address] [phone]        The results of significant diagnostics from this hospitalization (including imaging, microbiology, ancillary and laboratory) are listed below for reference.    Significant Diagnostic Studies: Dg Chest 1 View  04/26/2015   CLINICAL DATA:  Patient slipped and fell in the bathroom at home, injuring the left shoulder. Initial encounter.  EXAM: CHEST  1 VIEW  COMPARISON:  01/08/2015 and earlier.  FINDINGS: Cardiac silhouette upper normal in size, unchanged. Thoracic aorta atherosclerotic, unchanged. Right perihilar oval-shaped opacity, shown on prior CT to represent focal dilation of the intralobar pulmonary artery. Hilar and mediastinal contours otherwise unremarkable. Mildly prominent bronchovascular markings diffusely, unchanged. Minimal linear  scarring in the left lung base, unchanged. No new pulmonary parenchymal abnormalities. No visible pleural effusions.  IMPRESSION: No acute cardiopulmonary disease.   Electronically Signed   By: Evangeline Dakin M.D.   On: 04/26/2015 12:56   Ct  Shoulder Left Wo Contrast  04/26/2015   CLINICAL DATA:  Displaced left proximal humeral fracture.  EXAM: CT OF THE LEFT SHOULDER WITHOUT CONTRAST  TECHNIQUE: Multidetector CT imaging was performed according to the standard protocol. Multiplanar CT image reconstructions were also generated.  COMPARISON:  04/26/2015.  FINDINGS: Highly displaced surgical neck fracture of the left proximal humerus with 1.2 cm overlap, 3.1 cm anterior displacement of the distal fracture fragment, and 2.6 cm medial displacement of the distal fracture fragment. Accordingly, the metaphysis and shaft are significantly anteromedial to the humeral head, in the fractured surfaces are not in direct contact. The humeral head continues to articulate with the glenoid in the expected fashion.  There is a fracture the greater tuberosity but this is nondisplaced. No lesser tuberosity fracture fragment. I do not observe a scapular fracture or an adjacent rib fracture. T5 compression fracture may well be chronic. T6, T8, and T9 thoracic compression fractures with vertebral augmentations.  Atherosclerotic aortic arch.  IMPRESSION: 1. Two part surgical neck fracture left proximal humerus with the metaphysis/shaft fragment displaced 3.1 cm anterior and 2.6 cm medially with respect to the femoral head, which continues to articulate with the glenoid. There is 1.2 cm of overlap and the fractured surfaces are not contiguous. Please note that although this is only at 2 part fracture, the current degree of displacement is striking. 2. There is a nondisplaced fracture the greater tuberosity. Because this is nondisplaced, it does not make this fracture a 3 part fracture. 3. T5 compression fracture, probably chronic. Old T6, T8, and T9 thoracic compression fractures with vertebral augmentations.   Electronically Signed   By: Van Clines M.D.   On: 04/26/2015 16:32   Dg Chest Port 1 View  04/29/2015   CLINICAL DATA:  Patient with recent diagnosis of  pneumonia.  EXAM: PORTABLE CHEST - 1 VIEW  COMPARISON:  Chest radiograph 04/26/2015; 01/08/2015  FINDINGS: Enlarged cardiac and mediastinal contours. Bilateral predominately perihilar interstitial pulmonary opacities. Layering bilateral pleural effusions and underlying pulmonary consolidation. Re- demonstrated comminuted fracture through the proximal left humerus.  IMPRESSION: Cardiomegaly with interstitial pulmonary opacities favored to represent edema.  Layering bilateral pleural effusions and underlying pulmonary consolidation likely representing atelectasis.   Electronically Signed   By: Lovey Newcomer M.D.   On: 04/29/2015 11:41   Dg Shoulder Left  04/27/2015   CLINICAL DATA:  Closed manipulation of left humerus dislocation.  EXAM: LEFT SHOULDER - 2+ VIEW; DG C-ARM 1-60 MIN - NRPT [facility]  COMPARISON:  Preoperative radiographs and CT.  FINDINGS: Two fluoroscopic spot images of the left shoulder during closed manipulation demonstrates improved alignment of the proximal humerus fracture. Fluoroscopy time reported 6 seconds.  IMPRESSION: Intraoperative fluoroscopy during close manipulation of left proximal humerus fracture, the fracture is in improved alignment.   Electronically Signed   By: Jeb Levering M.D.   On: 04/27/2015 03:29   Dg Shoulder Left  04/26/2015   CLINICAL DATA:  Patient status post fall with left shoulder pain. History of cancer.  EXAM: LEFT SHOULDER - 2+ VIEW  COMPARISON:  Chest radiograph 04/26/2015; 07/25/2014  FINDINGS: There is a markedly comminuted fracture of the proximal left humerus involving the left humeral head. The humeral head appears to be located on limited views  however the proximal left humeral diaphysis appears to be medially dislocated. There is contour abnormality of the mid aspect of the left clavicle which is likely secondary to old healed clavicle fracture. Kyphoplasty material within the visualized thoracic spine. Multiple soft tissue calcifications.  IMPRESSION:  Comminuted proximal left humerus fracture. The proximal humeral diaphysis appears to be dislocated medially to the humeral head.  Likely chronic deformity of the mid body of the left clavicle, potentially secondary to old healed fracture. Recommend correlation with dedicated clavicle views.   Electronically Signed   By: Lovey Newcomer M.D.   On: 04/26/2015 12:53   Dg Shoulder Left Port  04/27/2015   CLINICAL DATA:  Proximal left humerus fracture postreduction.  EXAM: LEFT SHOULDER - 1 VIEW  COMPARISON:  Pre reduction views 1 day prior.  FINDINGS: Improved alignment of the comminuted displaced proximal humerus fracture with persistent but improved displacement of the humeral diaphysis. Glenohumeral alignment is suboptimally assessed, however humeral head is likely normally located. Soft tissue calcifications are again seen.  IMPRESSION: Improved alignment of comminuted displaced proximal humerus fracture. The glenohumeral alignment is suboptimally assessed, however humeral head is likely normally located.   Electronically Signed   By: Jeb Levering M.D.   On: 04/27/2015 02:18   Dg Hand Complete Left  04/26/2015   CLINICAL DATA:  Left hand pain after fall today. History of arthritis.  EXAM: LEFT HAND - COMPLETE 3+ VIEW  COMPARISON:  None.  FINDINGS: Advanced degenerative changes in the first carpometacarpal joint and IP joints diffusely. Joint space narrowing in the in the CP joints. There is a fracture through the proximal phalanx of the left thumb with mild angulation. No additional fracture noted.  IMPRESSION: Fracture through the base of the left thumb proximal phalanx with mild angulation.   Electronically Signed   By: Rolm Baptise M.D.   On: 04/26/2015 18:19   Dg C-arm 1-60 Min-no Report  04/27/2015   CLINICAL DATA: left humerus dislocation   C-ARM 1-60 MINUTES  Fluoroscopy was utilized by the requesting physician.  No radiographic  interpretation.     Microbiology: Recent Results (from the past 240  hour(s))  Surgical pcr screen     Status: Abnormal   Collection Time: 04/26/15  5:14 PM  Result Value Ref Range Status   MRSA, PCR NEGATIVE NEGATIVE Final   Staphylococcus aureus POSITIVE (A) NEGATIVE Final    Comment:        The Xpert SA Assay (FDA approved for NASAL specimens in patients over 20 years of age), is one component of a comprehensive surveillance program.  Test performance has been validated by [facility] for patients greater than or equal to 88 year old. It is not intended to diagnose infection nor to guide or monitor treatment.      Labs: Basic Metabolic Panel:  Recent Labs Lab 04/26/15 1355 04/27/15 0733 04/28/15 0515 04/29/15 1105  NA 142  --  141 141  K 3.0*  --  3.7 3.7  CL 104  --  106 107  CO2 27  --  27 25  GLUCOSE 121*  --  112* 114*  BUN 15  --  17 21*  CREATININE 0.75 0.83 0.94 0.95  CALCIUM 9.2  --  9.3 8.8*   Liver Function Tests: No results for input(s): AST, ALT, ALKPHOS, BILITOT, PROT, ALBUMIN in the last 168 hours. No results for input(s): LIPASE, AMYLASE in the last 168 hours. No results for input(s): AMMONIA in the last 168 hours. CBC:  Recent Labs Lab 04/26/15 1355 04/27/15 0733 04/28/15 0515 04/29/15 1043  WBC 11.9* 7.9 10.1 7.0  HGB 9.7* 7.9* 8.2* 8.2*  HCT 32.2* 27.1* 27.9* 27.5*  MCV 89.2 91.2 91.5 90.2  PLT 343 260 272 308   Cardiac Enzymes: No results for input(s): CKTOTAL, CKMB, CKMBINDEX, TROPONINI in the last 168 hours. BNP: BNP (last 3 results)  Recent Labs  04/29/15 1105  BNP 707.5*    ProBNP (last 3 results)  Recent Labs  05/06/14 2345 07/13/14 0457  PROBNP 5856.0* 3079.0*    CBG: No results for input(s): GLUCAP in the last 168 hours.

## 2015-04-30 NOTE — Discharge Instructions (Signed)
Humerus Fracture (Thrower's Fracture) with Rehab A humerus fracture is a break (fracture) of the bone of the upper arm (humerus). Humerus fractures may be complete or incomplete. This document does not discuss humerus fractures that involve the elbow or shoulder joints. SYMPTOMS   Severe pain in the arm, at the time of injury.  Tenderness and swelling over the fracture site.  Bruising (contusion) of the arm within 48 hours of injury.  Later, swelling and bruising in the elbow and hand.  Visible deformity, if the bone fragments are out of alignment (displaced).  Numbness, coldness, or paralysis below the fracture, from pressure on or stretching of blood vessels or nerves (uncommon). CAUSES   Direct hit (trauma) to the upper arm.  Indirect trauma (i.e. falling on an outstretched hand or violent muscle contraction).  Possibly, throwing hard enough to produce twisting force. RISK INCREASES WITH:  Contact sports (i.e. football, rugby).  Sports requiring violent arm muscle contraction (i.e. wrestling).  Sports requiring arm twisting (i.e. throwing sports).  Children younger than 66 years of age, adults older than 21.  History of bone or joint disease (i.e. osteoporosis, bone tumor).  Previous restraint of the arm.  Poor arm and shoulder strength and flexibility. PREVENTION   Warm up and stretch properly before activity.  Maintain physical fitness:  Strength, flexibility, and endurance.  Cardiovascular fitness.  Wear properly fitted and padded protective equipment, when appropriate. PROGNOSIS  If treated properly, humerus fractures often heal within 6 to 8 weeks in adults and 4 to 6 weeks in children.  RELATED COMPLICATIONS   Fracture fails to heal (nonunion).  Fracture heals in a poor position (malunion).  Chronic pain, stiffness, loss of motion, or swelling of the shoulder or elbow.  Excessive bleeding in the arm, causing pressure and injury to nerves and blood  vessels (uncommon).  Calcium deposits in the soft tissues (heterotopic ossification).  Injury to the nerves of the hand or wrist due to stretching from the fracture, causing numbness, weakness, or paralysis.  Shortening of the arm. TREATMENT  Treatment first involves the use of ice and medicine, to reduce pain and inflammation. If the bone fragments are out of alignment (displaced), immediate realigning of the bone (reduction) by a medically trained person is required. Fractures that cannot be realigned by hand, or are open (bones poke through the skin) may require surgery to hold the fracture in place with screws, pins, and plates. Once the bones are properly aligned, restraint of the upper arm, elbow, and shoulder is required for at least 6 weeks, to allow for healing. After restraint, it is important to perform strengthening and stretching exercises to help regain strength and a full range of motion. These exercises may be completed at home or with a therapist.  MEDICATION   If pain medicine is needed, nonsteroidal anti-inflammatory medicines (aspirin and ibuprofen), or other minor pain relievers (acetaminophen), are often advised.  Do not take pain medicine for 7 days before surgery.  Prescription pain relievers may be given if your caregiver thinks they are needed. Use only as directed and only as much as you need. COLD THERAPY  Cold treatment (icing) should be applied for 10 to 15 minutes every 2 to 3 hours for inflammation and pain, and immediately after activity that aggravates your symptoms. Use ice packs or an ice massage. SEEK MEDICAL CARE IF:   Pain, tenderness, or swelling gets worse, despite treatment.  You experience pain, numbness, or coldness in the hand.  Blue, gray, or dark  color appears in the fingernails.  Any of the following occur after surgery: fever, increased pain, swelling, redness, drainage of fluids, or bleeding in the affected area.  New, unexplained symptoms  develop. (Drugs used in treatment may produce side effects.) EXERCISES  RANGE OF MOTION (ROM) AND STRETCHING EXERCISES - Humerus Fracture (Thrower's Fracture) These exercises may help you restore your elbow motion, once your physician has discontinued your restraint period. Your symptoms may resolve with or without further involvement from your physician, physical therapist or athletic trainer. While completing these exercises, remember:   Restoring tissue flexibility helps normal motion to return to the joints. This allows healthier, less painful movement and activity.  An effective stretch should be held for at least 30 seconds.  A stretch should never be painful. You should only feel a gentle lengthening or release in the stretched tissue. ROM - Pendulum   Bend at the waist so that your right / left arm falls away from your body. Support yourself with your opposite hand on a solid surface, such as a table or a countertop.  Your right / left arm should be perpendicular to the ground. If it is not perpendicular, you need to lean over farther. Relax the muscles in your right / left arm and shoulder as much as possible.  Gently sway your hips and trunk so they move your right / left arm, without use of your right / left shoulder muscles.  Progress your movements, so that your right / left arm moves side to side, then forward and backward, and finally, both clockwise and counterclockwise.  Complete __________ repetitions in each direction. Many people use this exercise to relieve discomfort in their shoulder, as well as to gain range of motion. Repeat __________ times. Complete this exercise __________ times per day. RANGE OF MOTION - Extension  Hold your right / left arm at your side and straighten your elbow as far as you can using your right / left arm muscles.  Straighten the right / left elbow farther, by gently pushing down on your forearm until you feel a gentle stretch on the inside  of your elbow. Hold this position for __________ seconds.  Slowly return to the starting position. Repeat __________ times. Complete this exercise __________ times per day.  RANGE OF MOTION - Flexion  Hold your right / left arm at your side and bend your elbow as far as you can using your right / left arm muscles.  Bend the right / left elbow farther, by gently pushing up on your forearm until you feel a gentle stretch on the outside of your elbow. Hold this position for __________ seconds.  Slowly return to the starting position. Repeat __________ times. Complete this exercise __________ times per day.  STRETCH - Flexion, Seated   Sit in a firm chair, so that your right / left forearm can rest on a table or countertop. Your right / left elbow should rest below the height of your shoulder, so that your shoulder feels supported and not tense or uncomfortable.  Keeping your right / left shoulder relaxed, lean forward at your waist, allowing your right / left hand to slide forward. Bend forward until you feel a moderate stretch in your shoulder, but before you feel an increase in your pain.  Hold for __________ seconds. Slowly return to your starting position. Repeat __________ times. Complete this exercise __________ times per day.  STRETCH - Flexion, Standing   Stand with good posture. With an underhand grip  on your right / left hand, and an overhand grip on the opposite hand, grasp a broomstick or cane so that your hands are a little more than shoulder width apart.  Keeping your right / left elbow straight and shoulder muscles relaxed, push the stick with your opposite hand to raise your right / left arm in front of your body and then overhead. Raise your arm until you feel a stretch in your right / left shoulder, but before you have increased shoulder pain.  Try to avoid shrugging your right / left shoulder as your arm rises, by keeping your shoulder blade tucked down and toward your  mid-back spine. Hold for __________ seconds.  Slowly return to the starting position. Repeat __________ times. Complete this exercise __________ times per day.  STRETCH - Abduction, Supine   Lie on your back. With an underhand grip on your right / left hand, and an overhand grip on the opposite hand, grasp a broomstick or cane so that your hands are a little more than shoulder width apart.  Keeping your right / left elbow straight and shoulder muscles relaxed, push the stick with your opposite hand to raise your right / left arm out to the side of your body and then overhead. Raise your arm until you feel a stretch in your right / left shoulder, but before you have increased shoulder pain.  Try to avoid shrugging your right / left shoulder as your arm rises by keeping your shoulder blade tucked down and toward your mid-back spine. Hold for __________ seconds.  Slowly return to the starting position. Repeat __________ times. Complete this exercise __________ times per day.  ROM - Flexion, Active-Assisted  Lie on your back. You may bend your knees for comfort.  Grasp a broomstick or cane so your hands are about shoulder width apart. Your right / left hand should grip the end of the stick, so that your hand is positioned "thumbs-up," as if you were about to shake hands.  Using your healthy arm to lead, raise your right / left arm overhead until you feel a gentle stretch in your shoulder. Hold for __________ seconds.  Use the stick to assist in returning your right / left arm to its starting position. Repeat __________ times. Complete this exercise __________ times per day.  STRETCH - Flexion, Standing   Stand facing a wall. Walk your right / left fingers up the wall, until you feel a moderate stretch in your shoulder. As your hand gets higher, you may need to step closer to the wall or use a door frame to walk through.  Try to avoid shrugging your right / left shoulder as your arm rises, by  keeping your shoulder blade tucked down and toward your mid-back spine.  Hold for __________ seconds. Use your other hand, if needed, to ease out of the stretch and return to the starting position. Repeat __________ times. Complete this exercise __________ times per day.  STRETCH - External Rotation  Tuck a folded towel or small ball under your right / left upper arm. Grasp a broomstick or cane with an underhand grasp, a little more than shoulder width apart. Bend your elbows to 90 degrees.  Stand with good posture or sit in a firm chair without arms.  Use your healthy arm to push the stick across your body. Do not allow the towel or ball to fall. This will rotate your right / left arm away from your abdomen. Using the stick, turn or  rotate your hand and forearm away from your body. Hold for __________ seconds. Repeat __________ times. Complete this exercise __________ times per day.  STRENGTHENING EXERCISES - Humerus Fracture (Thrower's Fracture) These exercises may help you when beginning to rehabilitate your injury. They may resolve your symptoms with or without further involvement from your physician, physical therapist or athletic trainer. While completing these exercises, remember:   Muscles can gain both the endurance and the strength needed for everyday activities through controlled exercises.  Complete these exercises as instructed by your physician, physical therapist or athletic trainer. Increase the resistance and repetitions only as guided.  You may experience muscle soreness or fatigue, but the pain or discomfort you are trying to eliminate should never worsen during these exercises. If this pain does get worse, stop and make certain you are following the directions exactly. If the pain is still present after adjustments, discontinue the exercise until you can discuss the trouble with your clinician. STRENGTH - Shoulder Abductors, Isometric   With good posture, stand or sit about  4-6 inches from a wall, with your right / left side facing the wall.  Bend your right / left elbow. Gently press your right / left elbow into the wall. Increase the pressure gradually, until you are pressing as hard as you can without shrugging your shoulder or increasing any shoulder discomfort.  Hold for __________ seconds.  Release the tension slowly. Relax your shoulder muscles completely before you begin the next repetition. Repeat __________ times. Complete this exercise __________ times per day.  STRENGTH - Shoulder Flexion, Isometric   With good posture, stand or sit about 4-6 inches from a wall, with your right / left side facing the wall.  Keeping your right / left elbow straight, gently press the top of your fist into the wall. Increase the pressure gradually, until you are pressing as hard as you can without shrugging your shoulder or increasing any shoulder discomfort.  Hold for __________ seconds.  Release the tension slowly. Relax your shoulder muscles completely before you begin the next repetition. Repeat __________ times. Complete this exercise __________ times per day.  STRENGTH - Internal Rotators, Isometric   Keep your right / left elbow at your side and bend it 90 degrees.  Step into a door frame so that the inside of your right / left wrist can press against the door frame, without your upper arm leaving your side.  Gently press your right / left wrist into the door frame, as if you were trying to draw the palm of your hand to your abdomen. Gradually increase the tension, until you are pressing as hard as you can without shrugging your shoulder or increasing any shoulder discomfort.  Hold for __________ seconds.  Release the tension slowly. Relax your shoulder muscles completely before you begin the next repetition. Repeat __________ times. Complete this exercise __________ times per day.  STRENGTH - External Rotators, Isometric   Keep your right / left elbow at  your side and bend it 90 degrees.  Step into a door frame so that the outside of your right / left wrist can press against the door frame without your upper arm leaving your side.  Gently press your right / left wrist into the door frame, as if you were trying to swing the back of your hand away from your abdomen. Gradually increase the tension, until you are pressing as hard as you can without shrugging your shoulder or increasing any shoulder discomfort.  Hold for  __________ seconds.  Release the tension slowly. Relax your shoulder muscles completely before you begin the next repetition. Repeat __________ times. Complete this exercise __________ times per day.  STRENGTH - Shoulder Flexion   Stand or sit with good posture. Grasp a __________ weight, or an exercise band or tubing, so that your right / left hand is "thumbs-up," like you are shaking hands.  Slowly lift your right / left arm as far as you can, without increasing any shoulder pain. At first, many people can only raise their hand to shoulder height.  Avoid shrugging your right / left shoulder as your arm rises, by keeping your shoulder blade tucked down and toward your mid-back spine.  Hold for__________ seconds. Control your hand as you slowly return to your starting position. Repeat __________ times. Complete this exercise __________ times per day.  STRENGTH - Shoulder Abduction   Stand or sit with good posture. Grasp a __________ weight, or an exercise band or tubing, so that your hand is "thumbs-up," like you are shaking hands.  Slowly lift your right / left arm out to the side, to shoulder height or as far as you can, without increasing any shoulder pain. You must have specific instruction from your caregiver in order to raise your hand above shoulder height.  Avoid shrugging your right / left shoulder as your arm rises, by keeping your shoulder blade tucked down and toward your mid-back spine.  Hold for __________  seconds. Control your hand as you slowly return to your starting position. Repeat __________ times. Complete this exercise __________ times per day.  STRENGTH - External Rotators   Secure a rubber exercise band or tubing to a fixed object (table, pole) so that it is at the same height as your right / left elbow when you are standing or sitting on a firm surface.  Stand or sit so that the secured exercise band is on your healthy side.  Bend your right / left elbow 90 degrees. Place a folded towel or small pillow under your right / left arm, so that your elbow is a few inches away from your side.  Keeping the tension on the exercise band, pull it away from your body, as if pivoting on your elbow. Be sure to keep your body steady, so that the movement is only coming from your rotating shoulder.  Hold for __________ seconds. Release the tension in a controlled manner as you return to the starting position. Repeat __________ times. Complete this exercise __________ times per day.  STRENGTH - Internal Rotators   Secure a rubber exercise band or tubing to a fixed object (table, pole) so that it is at the same height as your right / left elbow when you are standing or sitting on a firm surface.  Stand or sit so that the secured exercise band is on your right / left side.  Bend your right / left elbow 90 degrees. Place a folded towel or small pillow under your right / left arm so that your elbow is a few inches away from your side.  Keeping the tension on the exercise band, pull it across your body toward your abdomen. Be sure to keep your body steady, so that the movement is only coming from your rotating shoulder.  Hold for __________ seconds. Release the tension in a controlled manner as you return to the starting position. Repeat __________ times. Complete this exercise __________ times per day.  Document Released: 11/22/2005 Document Revised: 02/14/2012 Document Reviewed: 03/06/2009  ExitCare  Patient Information 2015 [place]. This information is not intended to replace advice given to you by your health care provider. Make sure you discuss any questions you have with your health care provider.

## 2015-04-30 NOTE — Clinical Social Work Placement (Signed)
   CLINICAL SOCIAL WORK PLACEMENT  NOTE  Date:  04/30/2015  Patient Details  Name: [name] MRN: [id number] Date of Birth: [birth year]  Clinical Social Work is seeking post-discharge placement for this patient at the [place] level of care (*CSW will initial, date and re-position this form in  chart as items are completed):  Yes   Patient/family provided with [facility] Work Department's list of facilities offering this level of care within the geographic area requested by the patient (or if unable, by the patient's family).  Yes   Patient/family informed of their freedom to choose among providers that offer the needed level of care, that participate in Medicare, Medicaid or managed care program needed by the patient, have an available bed and are willing to accept the patient.  Yes   Patient/family informed of [facility]'s ownership interest in [facility] and [facility], as well as of the fact that they are under no obligation to receive care at these facilities.  PASRR submitted to EDS on 04/28/15     PASRR number received on 04/28/15     Existing PASRR number confirmed on       FL2 transmitted to all facilities in geographic area requested by pt/family on 04/28/15     FL2 transmitted to all facilities within larger geographic area on       Patient informed that his/her managed care company has contracts with or will negotiate with certain facilities, including the following:            Patient/family informed of bed offers received.  Patient chooses bed at [facility]     Physician recommends and patient chooses bed at      Patient to be transferred to [facility] on 04/30/15.  Patient to be transferred to facility by [facility]     Patient family notified on 04/23/15 of transfer.  Name of family member notified:  SON Jeff     PHYSICIAN       Additional Comment: Pt / family are aware of d/c to [facility] today. PT recommends [facility] transport. Pt / son are aware that out of pocket costs may be associated with [facility] transport. [facility] has reviewed d/c summary, scripts, avs. Scripts included in d/c packet. D/C summary sent to SNF for review prior to d/c.   _______________________________________________ Luretha Rued, LCSW 04/30/2015, 1:42 PM

## 2015-05-06 NOTE — Progress Notes (Signed)
Wasted Fentanyl 15mcg [name] and [name]

## 2015-05-07 ENCOUNTER — Ambulatory Visit: Payer: Self-pay | Admitting: Internal Medicine

## 2015-05-07 ENCOUNTER — Other Ambulatory Visit (HOSPITAL_COMMUNITY): Payer: Self-pay | Admitting: Orthopedic Surgery

## 2015-05-08 ENCOUNTER — Ambulatory Visit
Admission: RE | Admit: 2015-05-08 | Discharge: 2015-05-08 | Disposition: A | Discharge status: Home / Self Care | Payer: Medicare Other | Source: Ambulatory Visit | Attending: Cardiology | Admitting: Cardiology

## 2015-05-08 ENCOUNTER — Ambulatory Visit (INDEPENDENT_AMBULATORY_CARE_PROVIDER_SITE_OTHER): Payer: Medicare Other | Admitting: Cardiology

## 2015-05-08 ENCOUNTER — Encounter: Payer: Self-pay | Admitting: *Deleted

## 2015-05-08 VITALS — BP 138/66 | HR 89 | Ht 63.0 in | Wt 143.0 lb

## 2015-05-08 DIAGNOSIS — I10 Essential (primary) hypertension: Secondary | ICD-10-CM | POA: Diagnosis not present

## 2015-05-08 DIAGNOSIS — I5033 Acute on chronic diastolic (congestive) heart failure: Secondary | ICD-10-CM

## 2015-05-08 DIAGNOSIS — R002 Palpitations: Secondary | ICD-10-CM

## 2015-05-08 DIAGNOSIS — Z01810 Encounter for preprocedural cardiovascular examination: Secondary | ICD-10-CM

## 2015-05-08 DIAGNOSIS — I2510 Atherosclerotic heart disease of native coronary artery without angina pectoris: Secondary | ICD-10-CM | POA: Diagnosis not present

## 2015-05-08 DIAGNOSIS — R9431 Abnormal electrocardiogram [ECG] [EKG]: Secondary | ICD-10-CM

## 2015-05-08 LAB — BASIC METABOLIC PANEL
BUN: 14 mg/dL (ref 6–23)
CALCIUM: 9 mg/dL (ref 8.4–10.5)
CHLORIDE: 106 meq/L (ref 96–112)
CO2: 26 meq/L (ref 19–32)
CREATININE: 0.81 mg/dL (ref 0.40–1.20)
GFR: 72.52 mL/min (ref >60.00)
Glucose, Bld: 95 mg/dL (ref 70–99)
Potassium: 3.6 mEq/L (ref 3.5–5.1)
Sodium: 137 mEq/L (ref 135–145)

## 2015-05-08 LAB — BRAIN NATRIURETIC PEPTIDE: PRO B NATRI PEPTIDE: 469 pg/mL — AB (ref 0.0–100.0)

## 2015-05-08 NOTE — Progress Notes (Signed)
Cardiology Office Note   Date:  05/08/2015   ID:  [name], DOB 1934/05/06, MRN [id number]  PCP:  Martinique, [name], MD    Chief Complaint  Patient presents with  . New Evaluation    cardiac arrythmia      History of Present Illness: [name] is a 81 y.o. female who presents for preoperative cardiac clearance for ORIF of humerus fracture.  She has a history of HTN, chronic diastolic CHF, GERD and COPD.  She has a remote history of tobacco use.  For the past 2 months, she has been complaining of rapid onset of palpitations associated with neck pain and left arm discomfort.  This usually lasts about 5 minutes.  She denies any pain in her chest.  She has chronic SOB.  She has occasional vertigo but no syncope.  She occasionally has some LE edema.  She recently had a mechanical fall and suffered a left humeral fracture.  She had an ORIF done but apparently needs more surgery that will take at least 2 hours under general anesthesia.  Of note her BNP was elevated at 707 and chest xray showed pulmonary edema.  She also has a history of mild AS.      Past Medical History  Diagnosis Date  . Pernicious anemia   . Osteoarthritis   . Fibromyalgia   . Skin lesion     chronic calcific cutaneous lesions  . HX of multiple bleeding ulcers 09/01/2011  . Hypertension     takes Atenolol daily  . Emphysema   . Bronchitis     hx  . Adenocarcinoma of gastroesophageal junction   . Dizziness     d/t crystals in ears that "roll around"and can't get them out  . Back pain   . Osteoporosis   . Scoliosis   . Psoriasis   . H/O hiatal hernia   . GERD (gastroesophageal reflux disease)     takes Aciphex daily  . Gastric ulcer   . Constipation     since Chemo and Radiation;finished up in Nov 2012  . Diverticulosis   . History of colonic polyps   . Shingles     broke out 2wks ago;has been on meds and to finish up tomorrow  . Kidney stone 25+yrs ago  . Pernicious  anemia   . Vitamin B deficiency   . Complication of anesthesia     shortness of breath and pain with gas   . COPD (chronic obstructive pulmonary disease)   . Shortness of breath     with exertion   . Blood transfusion     Past Surgical History  Procedure Laterality Date  . Right hip replacement  2001  . Tonsillectomy      as a child  . Tubal ligation  1975  . Appendectomy  1975  . Cataract surgery  10+yrs ago  . Esophagogastroduodenoscopy    . Colonoscopy    . Extubation  12/20/2011       . Partial esophagectomy  12/20/2011    Procedure: ESOPHAGECTOMY PARTIAL;  Surgeon: Pierre Bali, MD;  Location: [facility];  Service: Thoracic;  Laterality: N/A;  . Total hip arthroplasty  06/30/2012    Procedure: TOTAL HIP ARTHROPLASTY ANTERIOR APPROACH;  Surgeon: Mcarthur Rossetti, MD;  Location: [facility];  Service: Orthopedics;  Laterality: Left;  Left total hip arthroplasty  . Shoulder closed reduction  04/26/2015    Procedure: CLOSED MANIPULATION SHOULDER;  Surgeon: Meredith Pel, MD;  Location: [facility];  Service: Orthopedics;;     Current Outpatient Prescriptions  Medication Sig Dispense Refill  . acetaminophen (TYLENOL) 500 MG tablet Take 2 tablets (1,000 mg total) by mouth 3 (three) times daily. 30 tablet 0  . albuterol (PROVENTIL HFA;VENTOLIN HFA) 108 (90 BASE) MCG/ACT inhaler Inhale 2 puffs into the lungs every 4 (four) hours as needed for wheezing (wheezing).    Marland Kitchen albuterol (PROVENTIL) (2.5 MG/3ML) 0.083% nebulizer solution Take 3 mLs (2.5 mg total) by nebulization every 2 (two) hours as needed for wheezing or shortness of breath. 75 mL 12  . amLODipine (NORVASC) 2.5 MG tablet Take 2.5 mg by mouth every morning.     Marland Kitchen aspirin EC 81 MG tablet Take 81 mg by mouth every morning.     Marland Kitchen atenolol (TENORMIN) 50 MG tablet Take 50 mg by mouth 2 (two) times daily.    . cyanocobalamin (,VITAMIN B-12,) 1000 MCG/ML injection Inject 1,000 mcg into the muscle every 6 (six) weeks.    .  ergocalciferol (VITAMIN D2) 50000 UNITS capsule Take 50,000 Units by mouth every 14 (fourteen) days.     Marland Kitchen esomeprazole (NEXIUM) 20 MG capsule Take 20 mg by mouth daily at 12 noon.    . Fluticasone Furoate-Vilanterol 200-25 MCG/INH AEPB Inhale 2 puffs into the lungs every morning.    . furosemide (LASIX) 20 MG tablet Take 20 mg by mouth every morning.     Marland Kitchen guaiFENesin (MUCINEX) 600 MG 12 hr tablet Take 1 tablet (600 mg total) by mouth 2 (two) times daily.    . hydroxypropyl methylcellulose (ISOPTO TEARS) 2.5 % ophthalmic solution Place 1 drop into both eyes 3 (three) times daily as needed for dry eyes.    Marland Kitchen ibuprofen (ADVIL,MOTRIN) 400 MG tablet Take 1 tablet (400 mg total) by mouth every 8 (eight) hours as needed (pain). 30 tablet 0  . ipratropium-albuterol (DUONEB) 0.5-2.5 (3) MG/3ML SOLN Take 3 mLs by nebulization every 6 (six) hours. 360 mL   . Lactobacillus Rhamnosus, GG, (CULTURELLE) CAPS Take 1 capsule by mouth every morning.    Marland Kitchen lisinopril (PRINIVIL,ZESTRIL) 40 MG tablet Take 40 mg by mouth every evening.     . loratadine (CLARITIN) 10 MG tablet Take 10 mg by mouth every morning.     Marland Kitchen LORazepam (ATIVAN) 0.5 MG tablet Take 1 tablet (0.5 mg total) by mouth every 8 (eight) hours as needed for anxiety. 10 tablet 0  . polyethylene glycol (MIRALAX / GLYCOLAX) packet Take 17 g by mouth daily. 14 each 0  . predniSONE (DELTASONE) 1 MG tablet Take 2 mg by mouth daily with breakfast.    . saccharomyces boulardii (FLORASTOR) 250 MG capsule Take 1 capsule (250 mg total) by mouth 2 (two) times daily. 30 capsule 0  . senna (SENOKOT) 8.6 MG TABS tablet Take 1 tablet (8.6 mg total) by mouth at bedtime. 120 each 0  . sodium chloride (OCEAN) 0.65 % SOLN nasal spray Place 1 spray into both nostrils as needed for congestion. 15 mL 0  . ST JOHNS WORT PO Take 1 capsule by mouth 2 (two) times daily.    Marland Kitchen tiZANidine (ZANAFLEX) 4 MG tablet Take 0.5-1 tablets (2-4 mg total) by mouth every 8 (eight) hours as needed  for muscle spasms. 30 tablet 0  . white petrolatum (VASELINE) GEL Apply 1 application topically as needed (anterior nares to prevent nose bleed). 106 g 0  No current facility-administered medications for this visit.    Allergies:   Keflex; Nulecit; Biaxin; Fentanyl; Spiriva handihaler; Tramadol; Adhesive; Clarithromycin; Darvon; Epinephrine; Morphine and related; Oxycodone; Sulfa antibiotics; and Vicodin    Social History:  The patient  reports that she quit smoking about 23 years ago. Her smoking use included Cigarettes. She smoked 3.00 packs per day. She has never used smokeless tobacco. She reports that she does not drink alcohol or use illicit drugs.   Family History:  The patient's family history includes Breast cancer in her paternal aunt, paternal aunt, and paternal aunt; Cancer in her maternal aunt and maternal grandmother. There is no history of Anesthesia problems, Hypotension, Malignant hyperthermia, or Pseudochol deficiency.    ROS:  Please see the history of present illness.   Otherwise, review of systems are positive for none.   All other systems are reviewed and negative.    PHYSICAL EXAM: VS:  BP 138/66 mmHg  Pulse 89  Ht 5\' 3"  (1.6 m)  Wt 143 lb (64.864 kg)  BMI 25.34 kg/m2 , BMI Body mass index is 25.34 kg/(m^2). GEN: Well nourished, well developed, in no acute distress HEENT: normal Neck: no JVD, carotid bruits, or masses Cardiac: RRR; no  rubs, or gallops,no edema.  2/6 SM at RUSB to LLSB Respiratory:  Crackles at left base GI: soft, nontender, nondistended, + BS MS: no deformity or atrophy Skin: warm and dry, no rash Neuro:  Strength and sensation are intact Psych: euthymic mood, full affect   EKG:  EKG is ordered today. The ekg ordered today demonstrates NSR with nonspecific ST abnormality   Recent Labs: 07/13/2014: Pro B Natriuretic peptide (BNP) 3079.0* 07/14/2014: ALT <5 04/29/2015: B Natriuretic Peptide 707.5*; BUN 21*; Creatinine 0.95; Hemoglobin 8.2*;  Platelets 308; Potassium 3.7; Sodium 141    Lipid Panel No results found for: CHOL, TRIG, HDL, CHOLHDL, VLDL, LDLCALC, LDLDIRECT    Wt Readings from Last 3 Encounters:  05/08/15 143 lb (64.864 kg)  04/27/15 140 lb (63.504 kg)  08/22/14 133 lb 1.6 oz (60.374 kg)       ASSESSMENT AND PLAN:  1.  Palpitations - I will get a heart monitor to assess for arrhythmias 2.  Left arm pain and neck pain associated with palpitations - ? Anginal equivalent.  I will check a 2D echo to assess LVF.   3.  HTN - controlled 4.  COPD 5.  Proximal humerus FX needing preoperative cardiac clearance for ORIF 6.  Heart murmur with history of mild AS - repeat echo to assess for progression 7.  Chronic diastolic CHF with crackles on exam today.  Check BNP and chest xray 8.  Coronary artery calcifications on chest CT 1 year ago - she cannot lift her arm up due to her fracture so nuclear medicine stress test would be difficult.  I will get a coronary CTA with morphology and calcium score to assess further for risk stratification.  Current medicines are reviewed at length with the patient today.  The patient does not have concerns regarding medicines.  The following changes have been made:  no change  Labs/ tests ordered today: See above Assessment and Plan No orders of the defined types were placed in this encounter.     Disposition:   FU with PRN pending results of studies SignedSueanne Margarita, MD  05/08/2015 3:19 PM    [facility] [address], [place], [place]  [address] Phone: [phone]; Fax: [phone]

## 2015-05-08 NOTE — Patient Instructions (Addendum)
Medication Instructions:  Your physician recommends that you continue on your current medications as directed. Please refer to the Current Medication list given to you today.   Labwork: TODAY: BMET, BNP  Testing/Procedures: Dr. Radford Pax recommends you have a CHEST XRAY.  Your physician has requested that you have an echocardiogram. Echocardiography is a painless test that uses sound waves to create images of your heart. It provides your doctor with information about the size and shape of your heart and how well your heart's chambers and valves are working. This procedure takes approximately one hour. There are no restrictions for this procedure.   Your physician has recommended that you wear an event monitor. Event monitors are medical devices that record the heart's electrical activity. Doctors most often [place] these monitors to diagnose arrhythmias. Arrhythmias are problems with the speed or rhythm of the heartbeat. The monitor is a small, portable device. You can wear one while you do your normal daily activities. This is usually used to diagnose what is causing palpitations/syncope (passing out).  Dr. Radford Pax recommends you have a CORONARY CTA AS SOON AS POSSIBLE.  Follow-Up: Your physician wants you to follow-up in: 1 year with Dr. Radford Pax. You will receive a reminder letter in the mail two months in advance. If you don't receive a letter, please call our office to schedule the follow-up appointment.   Any Other Special Instructions Will Be Listed Below (If Applicable).

## 2015-05-09 ENCOUNTER — Encounter: Payer: Self-pay | Admitting: Cardiology

## 2015-05-12 ENCOUNTER — Other Ambulatory Visit (HOSPITAL_COMMUNITY): Payer: Medicare Other

## 2015-05-12 ENCOUNTER — Telehealth: Payer: Self-pay | Admitting: Cardiology

## 2015-05-12 ENCOUNTER — Inpatient Hospital Stay (HOSPITAL_COMMUNITY)
Admission: RE | Admit: 2015-05-12 | Discharge: 2015-05-20 | DRG: 493 | Disposition: A | Discharge status: Skilled Nursing Facility | Payer: Medicare Other | Source: Ambulatory Visit | Attending: Cardiology | Admitting: Cardiology

## 2015-05-12 ENCOUNTER — Ambulatory Visit (HOSPITAL_COMMUNITY)
Admission: RE | Admit: 2015-05-12 | Discharge: 2015-05-12 | Disposition: A | Discharge status: Home / Self Care | Payer: Medicare Other | Source: Ambulatory Visit | Attending: Cardiology | Admitting: Cardiology

## 2015-05-12 ENCOUNTER — Telehealth: Payer: Self-pay

## 2015-05-12 ENCOUNTER — Ambulatory Visit (HOSPITAL_BASED_OUTPATIENT_CLINIC_OR_DEPARTMENT_OTHER)
Admission: RE | Admit: 2015-05-12 | Discharge: 2015-05-12 | Disposition: A | Discharge status: Home / Self Care | Payer: Medicare Other | Source: Ambulatory Visit | Attending: Cardiology | Admitting: Cardiology

## 2015-05-12 ENCOUNTER — Inpatient Hospital Stay (HOSPITAL_COMMUNITY): Admission: RE | Admit: 2015-05-12 | Payer: Medicare Other | Source: Ambulatory Visit

## 2015-05-12 DIAGNOSIS — I5032 Chronic diastolic (congestive) heart failure: Secondary | ICD-10-CM | POA: Diagnosis present

## 2015-05-12 DIAGNOSIS — Z96643 Presence of artificial hip joint, bilateral: Secondary | ICD-10-CM | POA: Diagnosis present

## 2015-05-12 DIAGNOSIS — M419 Scoliosis, unspecified: Secondary | ICD-10-CM | POA: Diagnosis present

## 2015-05-12 DIAGNOSIS — W010XXA Fall on same level from slipping, tripping and stumbling without subsequent striking against object, initial encounter: Secondary | ICD-10-CM | POA: Diagnosis present

## 2015-05-12 DIAGNOSIS — M797 Fibromyalgia: Secondary | ICD-10-CM | POA: Diagnosis present

## 2015-05-12 DIAGNOSIS — I5042 Chronic combined systolic (congestive) and diastolic (congestive) heart failure: Secondary | ICD-10-CM | POA: Diagnosis present

## 2015-05-12 DIAGNOSIS — R079 Chest pain, unspecified: Secondary | ICD-10-CM

## 2015-05-12 DIAGNOSIS — I351 Nonrheumatic aortic (valve) insufficiency: Secondary | ICD-10-CM

## 2015-05-12 DIAGNOSIS — K284 Chronic or unspecified gastrojejunal ulcer with hemorrhage: Secondary | ICD-10-CM | POA: Diagnosis present

## 2015-05-12 DIAGNOSIS — I340 Nonrheumatic mitral (valve) insufficiency: Secondary | ICD-10-CM | POA: Insufficient documentation

## 2015-05-12 DIAGNOSIS — Z01810 Encounter for preprocedural cardiovascular examination: Secondary | ICD-10-CM

## 2015-05-12 DIAGNOSIS — I2510 Atherosclerotic heart disease of native coronary artery without angina pectoris: Secondary | ICD-10-CM

## 2015-05-12 DIAGNOSIS — I471 Supraventricular tachycardia: Secondary | ICD-10-CM | POA: Diagnosis present

## 2015-05-12 DIAGNOSIS — I350 Nonrheumatic aortic (valve) stenosis: Secondary | ICD-10-CM | POA: Insufficient documentation

## 2015-05-12 DIAGNOSIS — Z9981 Dependence on supplemental oxygen: Secondary | ICD-10-CM

## 2015-05-12 DIAGNOSIS — S4292XA Fracture of left shoulder girdle, part unspecified, initial encounter for closed fracture: Secondary | ICD-10-CM | POA: Diagnosis present

## 2015-05-12 DIAGNOSIS — R05 Cough: Secondary | ICD-10-CM

## 2015-05-12 DIAGNOSIS — K274 Chronic or unspecified peptic ulcer, site unspecified, with hemorrhage: Secondary | ICD-10-CM | POA: Diagnosis present

## 2015-05-12 DIAGNOSIS — I517 Cardiomegaly: Secondary | ICD-10-CM | POA: Insufficient documentation

## 2015-05-12 DIAGNOSIS — R059 Cough, unspecified: Secondary | ICD-10-CM

## 2015-05-12 DIAGNOSIS — I10 Essential (primary) hypertension: Secondary | ICD-10-CM | POA: Diagnosis present

## 2015-05-12 DIAGNOSIS — Z8501 Personal history of malignant neoplasm of esophagus: Secondary | ICD-10-CM

## 2015-05-12 DIAGNOSIS — C160 Malignant neoplasm of cardia: Secondary | ICD-10-CM | POA: Diagnosis present

## 2015-05-12 DIAGNOSIS — M1990 Unspecified osteoarthritis, unspecified site: Secondary | ICD-10-CM | POA: Diagnosis present

## 2015-05-12 DIAGNOSIS — I5033 Acute on chronic diastolic (congestive) heart failure: Secondary | ICD-10-CM | POA: Diagnosis not present

## 2015-05-12 DIAGNOSIS — D510 Vitamin B12 deficiency anemia due to intrinsic factor deficiency: Secondary | ICD-10-CM | POA: Diagnosis present

## 2015-05-12 DIAGNOSIS — L409 Psoriasis, unspecified: Secondary | ICD-10-CM | POA: Diagnosis present

## 2015-05-12 DIAGNOSIS — K219 Gastro-esophageal reflux disease without esophagitis: Secondary | ICD-10-CM | POA: Diagnosis present

## 2015-05-12 DIAGNOSIS — Z7982 Long term (current) use of aspirin: Secondary | ICD-10-CM

## 2015-05-12 DIAGNOSIS — J189 Pneumonia, unspecified organism: Secondary | ICD-10-CM | POA: Diagnosis present

## 2015-05-12 DIAGNOSIS — Z87891 Personal history of nicotine dependence: Secondary | ICD-10-CM

## 2015-05-12 DIAGNOSIS — Z7952 Long term (current) use of systemic steroids: Secondary | ICD-10-CM

## 2015-05-12 DIAGNOSIS — S42202A Unspecified fracture of upper end of left humerus, initial encounter for closed fracture: Principal | ICD-10-CM | POA: Diagnosis present

## 2015-05-12 DIAGNOSIS — J449 Chronic obstructive pulmonary disease, unspecified: Secondary | ICD-10-CM | POA: Diagnosis present

## 2015-05-12 DIAGNOSIS — I272 Other secondary pulmonary hypertension: Secondary | ICD-10-CM | POA: Diagnosis present

## 2015-05-12 DIAGNOSIS — Z419 Encounter for procedure for purposes other than remedying health state, unspecified: Secondary | ICD-10-CM | POA: Insufficient documentation

## 2015-05-12 HISTORY — DX: Dependence on supplemental oxygen: Z99.81

## 2015-05-12 HISTORY — DX: Vitamin B12 deficiency anemia, unspecified: D51.9

## 2015-05-12 HISTORY — DX: Unspecified chronic bronchitis: J42

## 2015-05-12 HISTORY — DX: Dorsalgia, unspecified: M54.9

## 2015-05-12 HISTORY — DX: Other chronic pain: G89.29

## 2015-05-12 HISTORY — DX: Iron deficiency anemia, unspecified: D50.9

## 2015-05-12 HISTORY — DX: Personal history of other medical treatment: Z92.89

## 2015-05-12 HISTORY — DX: Unspecified fracture of shaft of humerus, left arm, initial encounter for closed fracture: S42.302A

## 2015-05-12 HISTORY — DX: Pneumonia, unspecified organism: J18.9

## 2015-05-12 MED ORDER — FUROSEMIDE 40 MG PO TABS: 40.0000 mg | ORAL_TABLET | Freq: Every morning | ORAL | Status: DC

## 2015-05-12 MED ORDER — POTASSIUM CHLORIDE CRYS ER 20 MEQ PO TBCR: 20.0000 meq | EXTENDED_RELEASE_TABLET | Freq: Every day | ORAL | Status: DC

## 2015-05-12 NOTE — Telephone Encounter (Signed)
-----   Message from Sueanne Margarita, MD sent at 05/08/2015  6:28 PM EDT ----- BNP elevated - increase Lasix to 40mg  BID for 3 days then 40mg  daily.  Add Kdur 42meq daily.  Needs to see extender early next week

## 2015-05-12 NOTE — Telephone Encounter (Signed)
New message      Pt is scheduled to have surgery tomorrow because of a broken arm.  She had an echo today and is to have a CT today also.  She cannot raise her arm above her head because she needs surgery.  Someone is telling Dr Randel Pigg office that Dr Radford Pax is not going to clear her for surgery.  Pt is at preop now.  Please call ASAP and let them know if she is going to be cleared

## 2015-05-12 NOTE — Telephone Encounter (Signed)
Informed patient of results and verbal understanding expressed.  Spoke with patient's nurse, Joy, and informed her of medication changes.  Instructed to INCREASE LASIX to 40 mg BID for three days then 40 mg daily. Instructed to START KDUR 20 meq daily. Both agree with treatment plan.

## 2015-05-12 NOTE — Telephone Encounter (Signed)
Informed patient that surgical consent cannot be given until a catheterization is done. Briefly reviewed catheterization procedure with patient and she agrees to treatment.  Scheduled catheterization for Wednesday, 05/14/15 with Dr. Angelena Form.  Reviewed instructions with patient's nurse, Joy. Faxed instruction letter to nursing home at [phone]. Arranged transportation with Juliann Pulse at the nursing home [phone]).

## 2015-05-12 NOTE — Telephone Encounter (Addendum)
Per Dr. Radford Pax, patient needs to have a heart cath before she is cleared for surgery. Notified Kim at [employer].  Called [employer], scheduler at patient's nursing home, and notified her of cancellation. I asked if I could speak with the patient to talk to her about upcoming procedure. Juliann Pulse st that patient will be back at 1230 today. I will call back to speak to the patient about the catheterization then and then speak to Juliann Pulse to arrange transportation.  Notified Kim at [facility] [employer] she will be notified when catheterization in scheduled.

## 2015-05-13 ENCOUNTER — Encounter (HOSPITAL_COMMUNITY): Admission: RE | Payer: Self-pay | Source: Ambulatory Visit

## 2015-05-13 ENCOUNTER — Inpatient Hospital Stay (HOSPITAL_COMMUNITY): Admission: RE | Admit: 2015-05-13 | Payer: Medicare Other | Source: Ambulatory Visit | Admitting: Orthopedic Surgery

## 2015-05-13 ENCOUNTER — Other Ambulatory Visit: Payer: Self-pay | Admitting: Cardiology

## 2015-05-13 DIAGNOSIS — R079 Chest pain, unspecified: Secondary | ICD-10-CM

## 2015-05-13 SURGERY — OPEN REDUCTION INTERNAL FIXATION (ORIF) PROXIMAL HUMERUS FRACTURE
Anesthesia: General | Site: Arm Upper | Laterality: Left

## 2015-05-13 NOTE — Telephone Encounter (Signed)
Patient was initially set up for coronary CTA with morphology due to inability to raise right arm above head for nuclear stress test.  Dr. Johnsie Cancel evaluated patient at time of chest CT and stated that the patient needs to have arm above head for CT as well.  He also stated that initial images of chest showed significant coronary artery calcifications and cancelled coronary CTA and recommended cardiac cath to assess for significant CAD prior to surgical clearance.

## 2015-05-14 ENCOUNTER — Encounter (HOSPITAL_COMMUNITY): Payer: Self-pay | Admitting: Cardiovascular Disease

## 2015-05-14 ENCOUNTER — Encounter (HOSPITAL_COMMUNITY)
Admission: RE | Disposition: A | Discharge status: Skilled Nursing Facility | Payer: Medicare Other | Source: Ambulatory Visit | Attending: Cardiology

## 2015-05-14 DIAGNOSIS — I2510 Atherosclerotic heart disease of native coronary artery without angina pectoris: Secondary | ICD-10-CM | POA: Diagnosis present

## 2015-05-14 DIAGNOSIS — S42202A Unspecified fracture of upper end of left humerus, initial encounter for closed fracture: Secondary | ICD-10-CM | POA: Diagnosis present

## 2015-05-14 DIAGNOSIS — M797 Fibromyalgia: Secondary | ICD-10-CM | POA: Diagnosis present

## 2015-05-14 DIAGNOSIS — J449 Chronic obstructive pulmonary disease, unspecified: Secondary | ICD-10-CM | POA: Diagnosis not present

## 2015-05-14 DIAGNOSIS — Z96643 Presence of artificial hip joint, bilateral: Secondary | ICD-10-CM | POA: Diagnosis present

## 2015-05-14 DIAGNOSIS — I10 Essential (primary) hypertension: Secondary | ICD-10-CM | POA: Diagnosis not present

## 2015-05-14 DIAGNOSIS — J438 Other emphysema: Secondary | ICD-10-CM | POA: Diagnosis not present

## 2015-05-14 DIAGNOSIS — D510 Vitamin B12 deficiency anemia due to intrinsic factor deficiency: Secondary | ICD-10-CM | POA: Diagnosis present

## 2015-05-14 DIAGNOSIS — Z87891 Personal history of nicotine dependence: Secondary | ICD-10-CM | POA: Diagnosis not present

## 2015-05-14 DIAGNOSIS — I519 Heart disease, unspecified: Secondary | ICD-10-CM | POA: Diagnosis not present

## 2015-05-14 DIAGNOSIS — M1990 Unspecified osteoarthritis, unspecified site: Secondary | ICD-10-CM | POA: Diagnosis present

## 2015-05-14 DIAGNOSIS — Z8501 Personal history of malignant neoplasm of esophagus: Secondary | ICD-10-CM | POA: Diagnosis not present

## 2015-05-14 DIAGNOSIS — W010XXA Fall on same level from slipping, tripping and stumbling without subsequent striking against object, initial encounter: Secondary | ICD-10-CM | POA: Diagnosis present

## 2015-05-14 DIAGNOSIS — Z7982 Long term (current) use of aspirin: Secondary | ICD-10-CM | POA: Diagnosis not present

## 2015-05-14 DIAGNOSIS — I272 Other secondary pulmonary hypertension: Secondary | ICD-10-CM | POA: Diagnosis present

## 2015-05-14 DIAGNOSIS — I350 Nonrheumatic aortic (valve) stenosis: Secondary | ICD-10-CM | POA: Diagnosis not present

## 2015-05-14 DIAGNOSIS — Z9981 Dependence on supplemental oxygen: Secondary | ICD-10-CM | POA: Diagnosis not present

## 2015-05-14 DIAGNOSIS — Z7952 Long term (current) use of systemic steroids: Secondary | ICD-10-CM | POA: Diagnosis not present

## 2015-05-14 DIAGNOSIS — I5032 Chronic diastolic (congestive) heart failure: Secondary | ICD-10-CM | POA: Diagnosis present

## 2015-05-14 DIAGNOSIS — M419 Scoliosis, unspecified: Secondary | ICD-10-CM | POA: Diagnosis present

## 2015-05-14 DIAGNOSIS — K219 Gastro-esophageal reflux disease without esophagitis: Secondary | ICD-10-CM | POA: Diagnosis present

## 2015-05-14 DIAGNOSIS — L409 Psoriasis, unspecified: Secondary | ICD-10-CM | POA: Diagnosis present

## 2015-05-14 DIAGNOSIS — I471 Supraventricular tachycardia: Secondary | ICD-10-CM | POA: Diagnosis present

## 2015-05-14 DIAGNOSIS — S42302A Unspecified fracture of shaft of humerus, left arm, initial encounter for closed fracture: Secondary | ICD-10-CM | POA: Diagnosis present

## 2015-05-14 HISTORY — PX: CARDIAC CATHETERIZATION: SHX172

## 2015-05-14 LAB — POCT I-STAT 3, VENOUS BLOOD GAS (G3P V)
ACID-BASE DEFICIT: 2 mmol/L (ref 0.0–2.0)
Bicarbonate: 23.1 mEq/L (ref 20.0–24.0)
O2 Saturation: 53 %
TCO2: 24 mmol/L (ref 0–100)
pCO2, Ven: 40.9 mmHg — ABNORMAL LOW (ref 45.0–50.0)
pH, Ven: 7.36 — ABNORMAL HIGH (ref 7.250–7.300)
pO2, Ven: 29 mmHg — CL (ref 30.0–45.0)

## 2015-05-14 LAB — CBC
HCT: 29.3 % — ABNORMAL LOW (ref 36.0–46.0)
Hemoglobin: 8.8 g/dL — ABNORMAL LOW (ref 12.0–15.0)
MCH: 26.4 pg (ref 26.0–34.0)
MCHC: 30 g/dL (ref 30.0–36.0)
MCV: 88 fL (ref 78.0–100.0)
Platelets: 484 10*3/uL — ABNORMAL HIGH (ref 150–400)
RBC: 3.33 MIL/uL — ABNORMAL LOW (ref 3.87–5.11)
RDW: 16.1 % — ABNORMAL HIGH (ref 11.5–15.5)
WBC: 9.7 10*3/uL (ref 4.0–10.5)

## 2015-05-14 LAB — POCT I-STAT 3, ART BLOOD GAS (G3+)
Acid-base deficit: 3 mmol/L — ABNORMAL HIGH (ref 0.0–2.0)
BICARBONATE: 21.1 meq/L (ref 20.0–24.0)
O2 SAT: 97 %
PO2 ART: 89 mmHg (ref 80.0–100.0)
TCO2: 22 mmol/L (ref 0–100)
pCO2 arterial: 34.2 mmHg — ABNORMAL LOW (ref 35.0–45.0)
pH, Arterial: 7.399 (ref 7.350–7.450)

## 2015-05-14 LAB — PREPARE RBC (CROSSMATCH)

## 2015-05-14 LAB — PROTIME-INR
INR: 1.16 (ref 0.00–1.49)
Prothrombin Time: 15 seconds (ref 11.6–15.2)

## 2015-05-14 SURGERY — RIGHT/LEFT HEART CATH AND CORONARY ANGIOGRAPHY

## 2015-05-14 MED ORDER — MIDAZOLAM HCL 2 MG/2ML IJ SOLN
INTRAMUSCULAR | Status: DC | PRN
  Administered 2015-05-14 (×2): 1 mg via INTRAVENOUS

## 2015-05-14 MED ORDER — IOHEXOL 350 MG/ML SOLN
INTRAVENOUS | Status: DC | PRN
  Administered 2015-05-14: 130 mL via INTRA_ARTERIAL

## 2015-05-14 MED ORDER — SODIUM CHLORIDE 0.9 % IJ SOLN: 3.0000 mL | Freq: Two times a day (BID) | INTRAMUSCULAR | Status: DC

## 2015-05-14 MED ORDER — IPRATROPIUM-ALBUTEROL 0.5-2.5 (3) MG/3ML IN SOLN
3.0000 mL | Freq: Four times a day (QID) | RESPIRATORY_TRACT | Status: DC
  Administered 2015-05-14 – 2015-05-20 (×22): 3 mL via RESPIRATORY_TRACT
  Filled 2015-05-14 (×23): qty 3

## 2015-05-14 MED ORDER — SODIUM CHLORIDE 0.9 % IJ SOLN: 3.0000 mL | INTRAMUSCULAR | Status: DC | PRN

## 2015-05-14 MED ORDER — SODIUM CHLORIDE 0.9 % IV SOLN
INTRAVENOUS | Status: DC
  Administered 2015-05-14: 10:00:00 via INTRAVENOUS

## 2015-05-14 MED ORDER — HEPARIN SODIUM (PORCINE) 1000 UNIT/ML IJ SOLN
INTRAMUSCULAR | Status: AC
  Filled 2015-05-14: qty 1

## 2015-05-14 MED ORDER — LIDOCAINE HCL (PF) 1 % IJ SOLN
INTRAMUSCULAR | Status: AC
  Filled 2015-05-14: qty 30

## 2015-05-14 MED ORDER — LISINOPRIL 40 MG PO TABS
40.0000 mg | ORAL_TABLET | Freq: Every evening | ORAL | Status: DC
  Administered 2015-05-15 – 2015-05-20 (×6): 40 mg via ORAL
  Filled 2015-05-14 (×7): qty 1

## 2015-05-14 MED ORDER — SODIUM CHLORIDE 0.9 % IV SOLN: 250.0000 mL | INTRAVENOUS | Status: DC | PRN

## 2015-05-14 MED ORDER — POLYETHYLENE GLYCOL 3350 17 G PO PACK
17.0000 g | PACK | Freq: Every day | ORAL | Status: DC | PRN
  Administered 2015-05-20: 17 g via ORAL
  Filled 2015-05-14: qty 1

## 2015-05-14 MED ORDER — LORATADINE 10 MG PO TABS
10.0000 mg | ORAL_TABLET | Freq: Every day | ORAL | Status: DC | PRN
  Administered 2015-05-14 – 2015-05-19 (×5): 10 mg via ORAL
  Filled 2015-05-14 (×7): qty 1

## 2015-05-14 MED ORDER — SODIUM CHLORIDE 0.9 % IJ SOLN
3.0000 mL | Freq: Two times a day (BID) | INTRAMUSCULAR | Status: DC
  Administered 2015-05-14 – 2015-05-19 (×5): 3 mL via INTRAVENOUS

## 2015-05-14 MED ORDER — HEPARIN SODIUM (PORCINE) 1000 UNIT/ML IJ SOLN
INTRAMUSCULAR | Status: DC | PRN
  Administered 2015-05-14: 3200 [IU] via INTRAVENOUS

## 2015-05-14 MED ORDER — GUAIFENESIN-DM 100-10 MG/5ML PO SYRP
5.0000 mL | ORAL_SOLUTION | ORAL | Status: DC | PRN
  Administered 2015-05-14 – 2015-05-20 (×10): 5 mL via ORAL
  Filled 2015-05-14 (×11): qty 5

## 2015-05-14 MED ORDER — SENNA 8.6 MG PO TABS
1.0000 | ORAL_TABLET | Freq: Every day | ORAL | Status: DC
  Administered 2015-05-15 – 2015-05-20 (×6): 8.6 mg via ORAL
  Filled 2015-05-14 (×8): qty 1

## 2015-05-14 MED ORDER — POTASSIUM CHLORIDE CRYS ER 20 MEQ PO TBCR
20.0000 meq | EXTENDED_RELEASE_TABLET | Freq: Every day | ORAL | Status: DC
  Administered 2015-05-14 – 2015-05-20 (×7): 20 meq via ORAL
  Filled 2015-05-14 (×7): qty 1

## 2015-05-14 MED ORDER — AMLODIPINE BESYLATE 2.5 MG PO TABS
2.5000 mg | ORAL_TABLET | Freq: Every morning | ORAL | Status: DC
  Administered 2015-05-15 – 2015-05-20 (×6): 2.5 mg via ORAL
  Filled 2015-05-14 (×6): qty 1

## 2015-05-14 MED ORDER — ATORVASTATIN CALCIUM 80 MG PO TABS
80.0000 mg | ORAL_TABLET | Freq: Every day | ORAL | Status: DC
  Administered 2015-05-14 – 2015-05-20 (×7): 80 mg via ORAL
  Filled 2015-05-14 (×7): qty 1

## 2015-05-14 MED ORDER — ALBUTEROL SULFATE (2.5 MG/3ML) 0.083% IN NEBU
2.5000 mg | INHALATION_SOLUTION | RESPIRATORY_TRACT | Status: DC | PRN
  Administered 2015-05-14 – 2015-05-20 (×4): 2.5 mg via RESPIRATORY_TRACT
  Filled 2015-05-14 (×3): qty 3

## 2015-05-14 MED ORDER — NITROGLYCERIN 1 MG/10 ML FOR IR/CATH LAB
INTRA_ARTERIAL | Status: AC
  Filled 2015-05-14: qty 10

## 2015-05-14 MED ORDER — ONDANSETRON HCL 4 MG/2ML IJ SOLN
4.0000 mg | Freq: Four times a day (QID) | INTRAMUSCULAR | Status: DC | PRN
  Administered 2015-05-14 – 2015-05-15 (×2): 4 mg via INTRAVENOUS
  Filled 2015-05-14 (×2): qty 2

## 2015-05-14 MED ORDER — HEPARIN (PORCINE) IN NACL 2-0.9 UNIT/ML-% IJ SOLN
INTRAMUSCULAR | Status: AC
  Filled 2015-05-14: qty 1000

## 2015-05-14 MED ORDER — PREDNISONE 1 MG PO TABS
2.0000 mg | ORAL_TABLET | Freq: Every day | ORAL | Status: DC
  Administered 2015-05-15 – 2015-05-20 (×6): 2 mg via ORAL
  Filled 2015-05-14 (×10): qty 2

## 2015-05-14 MED ORDER — MIDAZOLAM HCL 2 MG/2ML IJ SOLN
INTRAMUSCULAR | Status: AC
  Filled 2015-05-14: qty 2

## 2015-05-14 MED ORDER — ATENOLOL 50 MG PO TABS
50.0000 mg | ORAL_TABLET | Freq: Two times a day (BID) | ORAL | Status: DC
  Administered 2015-05-14 – 2015-05-20 (×13): 50 mg via ORAL
  Filled 2015-05-14 (×14): qty 1

## 2015-05-14 MED ORDER — PANTOPRAZOLE SODIUM 40 MG PO TBEC
40.0000 mg | DELAYED_RELEASE_TABLET | Freq: Every day | ORAL | Status: DC
  Administered 2015-05-15 – 2015-05-20 (×6): 40 mg via ORAL
  Filled 2015-05-14 (×7): qty 1

## 2015-05-14 MED ORDER — VERAPAMIL HCL 2.5 MG/ML IV SOLN
INTRAVENOUS | Status: DC | PRN
  Administered 2015-05-14: 12:00:00 via INTRA_ARTERIAL

## 2015-05-14 MED ORDER — FLUTICASONE FUROATE-VILANTEROL 200-25 MCG/INH IN AEPB: 2.0000 | INHALATION_SPRAY | Freq: Every morning | RESPIRATORY_TRACT | Status: DC

## 2015-05-14 MED ORDER — ALBUTEROL SULFATE (2.5 MG/3ML) 0.083% IN NEBU
INHALATION_SOLUTION | RESPIRATORY_TRACT | Status: AC
  Filled 2015-05-14: qty 3

## 2015-05-14 MED ORDER — SODIUM CHLORIDE 0.9 % IV SOLN: INTRAVENOUS | Status: AC

## 2015-05-14 MED ORDER — FUROSEMIDE 40 MG PO TABS
40.0000 mg | ORAL_TABLET | Freq: Every morning | ORAL | Status: DC
  Administered 2015-05-14 – 2015-05-20 (×6): 40 mg via ORAL
  Filled 2015-05-14 (×6): qty 1

## 2015-05-14 MED ORDER — TIZANIDINE HCL 2 MG PO TABS
2.0000 mg | ORAL_TABLET | Freq: Three times a day (TID) | ORAL | Status: DC | PRN
  Administered 2015-05-14 – 2015-05-15 (×2): 2 mg via ORAL
  Administered 2015-05-16: 4 mg via ORAL
  Administered 2015-05-17: 2 mg via ORAL
  Administered 2015-05-17: 4 mg via ORAL
  Administered 2015-05-17 – 2015-05-18 (×2): 2 mg via ORAL
  Administered 2015-05-18 – 2015-05-19 (×3): 4 mg via ORAL
  Administered 2015-05-20: 2 mg via ORAL
  Administered 2015-05-20 (×2): 4 mg via ORAL
  Filled 2015-05-14 (×19): qty 2

## 2015-05-14 MED ORDER — GUAIFENESIN ER 600 MG PO TB12
600.0000 mg | ORAL_TABLET | Freq: Two times a day (BID) | ORAL | Status: DC
  Administered 2015-05-14 – 2015-05-20 (×13): 600 mg via ORAL
  Filled 2015-05-14 (×15): qty 1

## 2015-05-14 MED ORDER — ACETAMINOPHEN 325 MG PO TABS
650.0000 mg | ORAL_TABLET | ORAL | Status: DC | PRN
  Administered 2015-05-14 – 2015-05-15 (×3): 650 mg via ORAL
  Filled 2015-05-14 (×2): qty 2

## 2015-05-14 MED ORDER — SODIUM CHLORIDE 0.9 % IV SOLN: Freq: Once | INTRAVENOUS | Status: DC

## 2015-05-14 MED ORDER — ASPIRIN 81 MG PO CHEW
81.0000 mg | CHEWABLE_TABLET | ORAL | Status: AC
  Administered 2015-05-14: 81 mg via ORAL

## 2015-05-14 MED ORDER — VERAPAMIL HCL 2.5 MG/ML IV SOLN
INTRAVENOUS | Status: AC
  Filled 2015-05-14: qty 2

## 2015-05-14 MED ORDER — ALBUTEROL SULFATE HFA 108 (90 BASE) MCG/ACT IN AERS: 2.0000 | INHALATION_SPRAY | RESPIRATORY_TRACT | Status: DC | PRN

## 2015-05-14 MED ORDER — ACETAMINOPHEN 325 MG PO TABS
ORAL_TABLET | ORAL | Status: AC
  Filled 2015-05-14: qty 1

## 2015-05-14 MED ORDER — ASPIRIN 81 MG PO CHEW
CHEWABLE_TABLET | ORAL | Status: AC
  Filled 2015-05-14: qty 1

## 2015-05-14 MED ORDER — ASPIRIN EC 81 MG PO TBEC
81.0000 mg | DELAYED_RELEASE_TABLET | Freq: Every morning | ORAL | Status: DC
  Administered 2015-05-15 – 2015-05-20 (×6): 81 mg via ORAL
  Filled 2015-05-14 (×6): qty 1

## 2015-05-14 MED ORDER — LORAZEPAM 0.5 MG PO TABS
0.5000 mg | ORAL_TABLET | Freq: Three times a day (TID) | ORAL | Status: DC | PRN
  Administered 2015-05-14 – 2015-05-20 (×8): 0.5 mg via ORAL
  Filled 2015-05-14 (×8): qty 1

## 2015-05-14 MED ORDER — ATENOLOL 50 MG PO TABS
50.0000 mg | ORAL_TABLET | Freq: Two times a day (BID) | ORAL | Status: DC
  Filled 2015-05-14: qty 1

## 2015-05-14 SURGICAL SUPPLY — 21 items
CATH BALLN WEDGE 5F 110CM (CATHETERS) ×3 IMPLANT
CATH INFINITI 5 FR JL3.5 (CATHETERS) ×3 IMPLANT
CATH INFINITI 5FR ANG PIGTAIL (CATHETERS) ×3 IMPLANT
CATH INFINITI 5FR MULTPACK ANG (CATHETERS) IMPLANT
CATH INFINITI JR4 5F (CATHETERS) ×3 IMPLANT
CATH SWAN GANZ 7F STRAIGHT (CATHETERS) IMPLANT
DEVICE RAD COMP TR BAND LRG (VASCULAR PRODUCTS) ×3 IMPLANT
GLIDESHEATH SLEND SS 6F .021 (SHEATH) ×3 IMPLANT
GUIDEWIRE .025 260CM (WIRE) ×3 IMPLANT
KIT HEART LEFT (KITS) ×3 IMPLANT
KIT HEART RIGHT NAMIC (KITS) ×3 IMPLANT
PACK CARDIAC CATHETERIZATION (CUSTOM PROCEDURE TRAY) ×3 IMPLANT
SHEATH FAST CATH BRACH 5F 5CM (SHEATH) ×3 IMPLANT
SHEATH PINNACLE 5F 10CM (SHEATH) IMPLANT
SHEATH PINNACLE 7F 10CM (SHEATH) IMPLANT
SYR MEDRAD MARK V 150ML (SYRINGE) ×3 IMPLANT
TRANSDUCER W/STOPCOCK (MISCELLANEOUS) ×3 IMPLANT
TUBING CIL FLEX 10 FLL-RA (TUBING) ×3 IMPLANT
WIRE EMERALD 3MM-J .035X150CM (WIRE) IMPLANT
WIRE HI TORQ VERSACORE-J 145CM (WIRE) ×3 IMPLANT
WIRE SAFE-T 1.5MM-J .035X260CM (WIRE) ×3 IMPLANT

## 2015-05-14 NOTE — H&P (Addendum)
Patient ID: [name] MRN: [id number] DOB/AGE: [birth year] 81 y.o. Admit date: 05/14/2015  Primary Cardiologist: Fransico Him  HPI:  81 yo female with history of severe COPD, pernicious anemia, prior GI bleeding, HTN, esophageal adenocarcinoma, GERD who is admitted today following an outpatient diagnostic cardiac cath. She was seen in the office by Dr. Fransico Him 05/08/15 for pre-operative risk assessment prior to planned surgical correction of left humeral fracture. Given her history of prior tobacco abuse and notation of coronary calcification on CT chest, a right and left heart cath was arranged. She has had no chest pain. Her only complaint is baseline SOB and left arm pain. She wears supplemental oxygen therapy at night. She has been seen by Dr. Alphonzo Severance with orthopedics and her arm is not felt to be healing. He is planning a surgical correction of the fracture. Of note, she has a long history of pernicious anemia but reports no recent need for blood transfusions.   Review of systems complete and found to be negative unless listed above   Past Medical History  Diagnosis Date  . Pernicious anemia   . Osteoarthritis   . Fibromyalgia   . Skin lesion     chronic calcific cutaneous lesions  . HX of multiple bleeding ulcers 09/01/2011  . Hypertension     takes Atenolol daily  . Emphysema   . Bronchitis     hx  . Adenocarcinoma of gastroesophageal junction   . Dizziness     d/t crystals in ears that "roll around"and can't get them out  . Back pain   . Osteoporosis   . Scoliosis   . Psoriasis   . H/O hiatal hernia   . GERD (gastroesophageal reflux disease)     takes Aciphex daily  . Gastric ulcer   . Constipation     since Chemo and Radiation;finished up in Nov 2012  . Diverticulosis   . History of colonic polyps   . Shingles     broke out 2wks ago;has been on meds and to finish up tomorrow  . Kidney stone 25+yrs ago  . Pernicious anemia   . Vitamin B deficiency     . Complication of anesthesia     shortness of breath and pain with gas   . COPD (chronic obstructive pulmonary disease)   . Shortness of breath     with exertion   . Blood transfusion     Family History  Problem Relation Age of Onset  . Cancer Maternal Grandmother     gynecologic  . Cancer Maternal Aunt     gynecologic  . Breast cancer Paternal Aunt   . Breast cancer Paternal Aunt   . Breast cancer Paternal Aunt   . Anesthesia problems Neg Hx   . Hypotension Neg Hx   . Malignant hyperthermia Neg Hx   . Pseudochol deficiency Neg Hx     History   Social History  . Marital Status: Widowed    Spouse Name: N/A  . Number of Children: 5  . Years of Education: 12   Occupational History  . disabled    Social History Main Topics  . Smoking status: Former Smoker -- 3.00 packs/day    Types: Cigarettes    Quit date: 12/07/1991  . Smokeless tobacco: Never Used  . Alcohol Use: No  . Drug Use: No  . Sexual Activity: No   Other Topics Concern  . 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 Right hip replacement  2001  . Tonsillectomy      as a child  . Tubal ligation  1975  . Appendectomy  1975  . Cataract surgery  10+yrs ago  . Esophagogastroduodenoscopy    . Colonoscopy    . Extubation  12/20/2011       . Partial esophagectomy  12/20/2011    Procedure: ESOPHAGECTOMY PARTIAL;  Surgeon: Pierre Bali, MD;  Location: [facility];  Service: Thoracic;  Laterality: N/A;  . Total hip arthroplasty  06/30/2012    Procedure: TOTAL HIP ARTHROPLASTY ANTERIOR APPROACH;  Surgeon: Mcarthur Rossetti, MD;  Location: [facility];  Service: Orthopedics;  Laterality: Left;  Left total hip arthroplasty  . Shoulder closed reduction  04/26/2015    Procedure: CLOSED MANIPULATION SHOULDER;  Surgeon: Meredith Pel, MD;  Location: [facility];  Service:  Orthopedics;;    Allergies  Allergen Reactions  . Keflex [Cephalexin] Itching    "severe itching"  . Nulecit [Na Ferric Gluc Cplx In Sucrose] Diarrhea, Nausea Only and Other (See Comments)      drop in BP  . Biaxin [Clarithromycin] Itching  . Fentanyl Itching  . Spiriva Handihaler [Tiotropium Bromide Monohydrate] Other (See Comments)    constipation  . Tramadol Itching    Skin burning and itching  . Adhesive [Tape] Itching  . Clarithromycin Itching  . Darvon Itching  . Epinephrine Itching  . Morphine And Related Itching  . Oxycodone Itching  . Sulfa Antibiotics Itching  . Vicodin [Hydrocodone-Acetaminophen] Itching    Can take with Benadryl    Prior to Admission Meds:  Prior to Admission medications   Medication Sig Start Date End Date Taking? Authorizing Provider  acetaminophen (TYLENOL) 500 MG tablet Take 2 tablets (1,000 mg total) by mouth 3 (three) times daily. 04/29/15  Yes Shanker Kristeen Mans, MD  albuterol (PROVENTIL) (2.5 MG/3ML) 0.083% nebulizer solution Take 3 mLs (2.5 mg total) by nebulization every 2 (two) hours as needed for wheezing or shortness of breath. 04/29/15  Yes Shanker Kristeen Mans, MD  amLODipine (NORVASC) 2.5 MG tablet Take 2.5 mg by mouth every morning.  08/13/14  Yes Historical Provider, MD  aspirin EC 81 MG tablet Take 81 mg by mouth every morning.    Yes Historical Provider, MD  atenolol (TENORMIN) 50 MG tablet Take 50 mg by mouth 2 (two) times daily.   Yes Historical Provider, MD  ergocalciferol (VITAMIN D2) 50000 UNITS capsule Take 50,000 Units by mouth every 14 (fourteen) days.    Yes Historical Provider, MD  esomeprazole (NEXIUM) 20 MG capsule Take 20 mg by mouth daily at 12 noon.   Yes Historical Provider, MD  Fluticasone Furoate-Vilanterol 200-25 MCG/INH AEPB Inhale 2 puffs into the lungs every morning.   Yes Historical Provider, MD  furosemide (LASIX) 40 MG tablet Take 1 tablet (40 mg total) by mouth every morning. 05/12/15  Yes Sueanne Margarita, MD    guaiFENesin (MUCINEX) 600 MG 12 hr tablet Take 1 tablet (600 mg total) by mouth 2 (two) times daily. 04/29/15  Yes Shanker Kristeen Mans, MD  hydroxypropyl methylcellulose (ISOPTO TEARS) 2.5 % ophthalmic solution Place 1 drop into both eyes 3 (three) times daily as needed for dry eyes.   Yes Historical Provider, MD  ipratropium-albuterol (DUONEB) 0.5-2.5 (3) MG/3ML SOLN Take 3 mLs by nebulization every 6 (six) hours. 04/29/15  Yes Shanker Kristeen Mans, MD  Lactobacillus Rhamnosus, GG, (CULTURELLE) CAPS Take 1 capsule by mouth every morning.   Yes Historical Provider, MD  lisinopril (PRINIVIL,ZESTRIL) 40 MG tablet Take 40 mg by mouth every evening.  08/13/14  Yes Historical Provider, MD  loratadine (CLARITIN) 10 MG tablet Take 10 mg by mouth every morning.    Yes Historical Provider, MD  LORazepam (ATIVAN) 0.5 MG tablet Take 1 tablet (0.5 mg total) by mouth every 8 (eight) hours as needed for anxiety. 04/29/15  Yes Shanker Kristeen Mans, MD  polyethylene glycol (MIRALAX / GLYCOLAX) packet Take 17 g by mouth daily. 04/29/15  Yes Shanker Kristeen Mans, MD  predniSONE (DELTASONE) 1 MG tablet Take 2 mg by mouth daily with breakfast.   Yes Historical Provider, MD  saccharomyces boulardii (FLORASTOR) 250 MG capsule Take 1 capsule (250 mg total) by mouth 2 (two) times daily. 05/02/14  Yes Modena Jansky, MD  senna (SENOKOT) 8.6 MG TABS tablet Take 1 tablet (8.6 mg total) by mouth at bedtime. 04/29/15  Yes Shanker Kristeen Mans, MD  sodium chloride (OCEAN) 0.65 % SOLN nasal spray Place 1 spray into both nostrils as needed for congestion. 05/02/14  Yes Modena Jansky, MD  ST JOHNS WORT PO Take 1 capsule by mouth 2 (two) times daily.   Yes Historical Provider, MD  tiZANidine (ZANAFLEX) 4 MG tablet Take 0.5-1 tablets (2-4 mg total) by mouth every 8 (eight) hours as needed for muscle spasms. 04/29/15  Yes Shanker Kristeen Mans, MD  albuterol (PROVENTIL HFA;VENTOLIN HFA) 108 (90 BASE) MCG/ACT inhaler Inhale 2 puffs into the lungs every 4  (four) hours as needed for wheezing (wheezing). 04/29/15   Shanker Kristeen Mans, MD  cyanocobalamin (,VITAMIN B-12,) 1000 MCG/ML injection Inject 1,000 mcg into the muscle every 6 (six) weeks.    Historical Provider, MD  potassium chloride SA (K-DUR,KLOR-CON) 20 MEQ tablet Take 1 tablet (20 mEq total) by mouth daily. 05/12/15   Sueanne Margarita, MD    Physical Exam: Blood pressure 128/75, pulse 77, temperature 98.2 F (36.8 C), temperature source Oral, resp. rate 12, height 5\' 3"  (1.6 m), weight 140 lb (63.504 kg), SpO2 99 %.    General: Well developed, well nourished, NAD  HEENT: OP clear, mucus membranes moist  SKIN: warm, dry. No rashes.  Neuro: No focal deficits  Musculoskeletal: Muscle strength 5/5 all ext  Psychiatric: Mood and affect normal  Neck: No JVD, no carotid bruits, no thyromegaly, no lymphadenopathy.  Lungs:Clear bilaterally, no wheezes, rhonci, crackles  Cardiovascular: Regular rate and rhythm. No murmurs, gallops or rubs.  Abdomen:Soft. Bowel sounds present. Non-tender.  Extremities: No lower extremity edema. Pulses are 2 + in the bilateral DP/PT.   Labs:   Lab Results  Component Value Date   WBC 9.7 05/14/2015   HGB 8.8* 05/14/2015   HCT 29.3* 05/14/2015   MCV 88.0 05/14/2015   PLT 484* 05/14/2015    Recent Labs Lab 05/08/15 1551  NA 137  K 3.6  CL 106  CO2 26  BUN 14  CREATININE 0.81  CALCIUM 9.0  GLUCOSE 95    ASSESSMENT AND PLAN:   1. CAD:  This a complex situation. She is an elderly female with left arm fracture with need for surgical correction. She has no angina but is found to have severe multi-vessel CAD today by cath. Her cath was arranged to exclude obstructive CAD before her planned surgery. Her CAD would be best approached with bypass surgery but she is a poor  candidate for CABG given advanced age, severe COPD, chronic steroid use and poor mobility. She is also a poor candidate for stenting given the location of the left main disease and history of  chronic anemia. Her RCA could be easily treated with PCI but placing a stent would require one month of dual anti-platelet therapy before orthopedic surgery could be considered. The moderately severe left main stenosis would be more difficult to treat with PCI given the ostial location.   I will admit to telemetry. I see three potential options here. The first option would be CABG so I will ask CT surgery to see her but I do not think she is a good candidate for an open chest procedure because of reasons outlined above. If CT surgery does not feel that she would be a surgical candidate, would have to consider PCI of the RCA with medical management of the left main lesion. If we pursue PCI of the RCA, she will not be able to stop dual anti-platelet therapy for one month if a bare a metal stent is used. ? If ortho could consider delaying surgery on her arm for now.  We could also consider palliation with her overall poor functional status and poor prognosis. Will continue home meds. Will not start Plavix until we have a better disposition.   2. Left humerus fracture: Will need to get ortho involved. She has been seen by Dr. Marlou Sa.   3. Severe COPD: Continue home meds.   Darlina Guys, MD 05/14/2015, 12:58 PM   Addendum: 05/14/15 2:54pm  I have spoken to her son Dellis Filbert on the phone this afternoon [phone]). I have reviewed her current status. He is her Press photographer. I have explained that her CAD is complex given the left main stenosis. She will not be a good candidate for CABG. Her son states that she has a low functional state with surgery 4 years ago for esophageal adenocarcinoma and has been told by her oncologist that she should not pursue any other elective surgical procedures. She resides in a nursing home and has limited ambulatory ability given her COPD, back pain and hip pain. She currently only has c/o left arm pain. I have explained to her son and to the patient that palliation  may be the best option. PCI/stenting of the left main would be very high risk. PCI/stenting of the RCA would be feasible but the requirement for dual anti-platelet therapy would limit options for palliative surgery on her arm to at least 4 weeks from now if a bare metal stent was used in the RCA.   I have spoken to CT surgery and they will see her today. I will ask Dr. Alphonzo Severance from orthopedics to weigh in as well. The best option may be proceeding with surgery on her arm with medical management of CAD knowing that she has a high risk for cardiac event in the peri-operative period.  She is willing to take this approach.   Kaysan Peixoto 05/14/2015 3:04 PM

## 2015-05-14 NOTE — Consult Note (Signed)
Reason for Consult:Left main and 3 vessel CAD Referring Physician: Dr. Felecia Jan BLAINE GUIFFRE is an 81 y.o. female.  HPI: 81 yo woman with a left humeral fracture who is admitted after undergoing cardiac catheterization.  Mrs. Bensen is a 81 yo woman with a complex medical history including stage IIIA esophageal cancer treated with chemo, radiation and transhiatal esophagectomy. She also has a PMH of hypertension, remote heavy tobacco abuse, severe COPD on home O2 at night, arthritis, fibromyalgia, scoliosis and pernicious anemia. She recently fell and broke her left humerus and needs ORIF.   She was seen by Dr. Radford Pax for Cardiology clearance. She related a history of palpitations associated with neck and arm pain over the past 2 months. She has chronic dyspnea. She has had a persistent cough for the past 6 weeks. Her physical activities are limited by her COPD.  Today she underwent cardiac catheterization where she was found to left main and 3 vessel CAD.    Past Medical History  Diagnosis Date  . Pernicious anemia   . Osteoarthritis   . Fibromyalgia   . Skin lesion     chronic calcific cutaneous lesions  . HX of multiple bleeding ulcers 09/01/2011  . Hypertension     takes Atenolol daily  . Emphysema   . Bronchitis     hx  . Adenocarcinoma of gastroesophageal junction   . Dizziness     d/t crystals in ears that "roll around"and can't get them out  . Back pain   . Osteoporosis   . Scoliosis   . Psoriasis   . H/O hiatal hernia   . GERD (gastroesophageal reflux disease)     takes Aciphex daily  . Gastric ulcer   . Constipation     since Chemo and Radiation;finished up in Nov 2012  . Diverticulosis   . History of colonic polyps   . Shingles     broke out 2wks ago;has been on meds and to finish up tomorrow  . Kidney stone 25+yrs ago  . Pernicious anemia   . Vitamin B deficiency   . Complication of anesthesia     shortness of breath and pain with gas   . COPD  (chronic obstructive pulmonary disease)   . Shortness of breath     with exertion   . Blood transfusion     Past Surgical History  Procedure Laterality Date  . Right hip replacement  2001  . Tonsillectomy      as a child  . Tubal ligation  1975  . Appendectomy  1975  . Cataract surgery  10+yrs ago  . Esophagogastroduodenoscopy    . Colonoscopy    . Extubation  12/20/2011       . Partial esophagectomy  12/20/2011    Procedure: ESOPHAGECTOMY PARTIAL;  Surgeon: Pierre Bali, MD;  Location: [facility];  Service: Thoracic;  Laterality: N/A;  . Total hip arthroplasty  06/30/2012    Procedure: TOTAL HIP ARTHROPLASTY ANTERIOR APPROACH;  Surgeon: Mcarthur Rossetti, MD;  Location: [facility];  Service: Orthopedics;  Laterality: Left;  Left total hip arthroplasty  . Shoulder closed reduction  04/26/2015    Procedure: CLOSED MANIPULATION SHOULDER;  Surgeon: Meredith Pel, MD;  Location: [facility];  Service: Orthopedics;;  . Cardiac catheterization N/A 05/14/2015    Procedure: Right/Left Heart Cath and Coronary Angiography;  Surgeon: Burnell Blanks, MD;  Location: [facility];  Service: Cardiovascular;  Laterality: N/A;    Family History  Problem  Relation Age of Onset  . Cancer Maternal Grandmother     gynecologic  . Cancer Maternal Aunt     gynecologic  . Breast cancer Paternal Aunt   . Breast cancer Paternal Aunt   . Breast cancer Paternal Aunt   . Anesthesia problems Neg Hx   . Hypotension Neg Hx   . Malignant hyperthermia Neg Hx   . Pseudochol deficiency Neg Hx     Social History:  reports that she quit smoking about 23 years ago. Her smoking use included Cigarettes. She smoked 3.00 packs per day. She has never used smokeless tobacco. She reports that she does not drink alcohol or use illicit drugs.  Allergies:  Allergies  Allergen Reactions  . Keflex [Cephalexin] Itching    "severe itching"  . Nulecit [Na Ferric Gluc Cplx In Sucrose] Diarrhea, Nausea Only and  Other (See Comments)      drop in BP  . Biaxin [Clarithromycin] Itching  . Fentanyl Itching  . Spiriva Handihaler [Tiotropium Bromide Monohydrate] Other (See Comments)    constipation  . Tramadol Itching    Skin burning and itching  . Adhesive [Tape] Itching  . Clarithromycin Itching  . Darvon Itching  . Epinephrine Itching  . Morphine And Related Itching  . Oxycodone Itching  . Sulfa Antibiotics Itching  . Vicodin [Hydrocodone-Acetaminophen] Itching    Can take with Benadryl    Medications:  Prior to Admission:  Prescriptions prior to admission  Medication Sig Dispense Refill Last Dose  . acetaminophen (TYLENOL) 500 MG tablet Take 2 tablets (1,000 mg total) by mouth 3 (three) times daily. 30 tablet 0 05/14/2015 at 0730  . albuterol (PROVENTIL) (2.5 MG/3ML) 0.083% nebulizer solution Take 3 mLs (2.5 mg total) by nebulization every 2 (two) hours as needed for wheezing or shortness of breath. 75 mL 12 05/14/2015 at 0730  . amLODipine (NORVASC) 2.5 MG tablet Take 2.5 mg by mouth every morning.    05/14/2015 at 0730  . aspirin EC 81 MG tablet Take 81 mg by mouth every morning.    Past Week at Unknown time  . atenolol (TENORMIN) 50 MG tablet Take 50 mg by mouth 2 (two) times daily.   05/14/2015 at 0730  . ergocalciferol (VITAMIN D2) 50000 UNITS capsule Take 50,000 Units by mouth every 14 (fourteen) days.    Past Month at Unknown time  . esomeprazole (NEXIUM) 20 MG capsule Take 20 mg by mouth daily at 12 noon.   05/14/2015 at 0730  . Fluticasone Furoate-Vilanterol 200-25 MCG/INH AEPB Inhale 2 puffs into the lungs every morning.   05/14/2015 at 0730  . furosemide (LASIX) 40 MG tablet Take 1 tablet (40 mg total) by mouth every morning. 30 tablet 6 05/13/2015 at 0730  . guaiFENesin (MUCINEX) 600 MG 12 hr tablet Take 1 tablet (600 mg total) by mouth 2 (two) times daily.   05/14/2015 at 0730  . hydroxypropyl methylcellulose (ISOPTO TEARS) 2.5 % ophthalmic solution Place 1 drop into both eyes 3 (three) times  daily as needed for dry eyes.   05/13/2015 at Unknown time  . ipratropium-albuterol (DUONEB) 0.5-2.5 (3) MG/3ML SOLN Take 3 mLs by nebulization every 6 (six) hours. 360 mL  05/14/2015 at Unknown time  . Lactobacillus Rhamnosus, GG, (CULTURELLE) CAPS Take 1 capsule by mouth every morning.   05/14/2015 at 0730  . lisinopril (PRINIVIL,ZESTRIL) 40 MG tablet Take 40 mg by mouth every evening.    05/14/2015 at 0730  . loratadine (CLARITIN) 10 MG tablet Take 10 mg by  mouth every morning.    Past Week at Unknown time  . LORazepam (ATIVAN) 0.5 MG tablet Take 1 tablet (0.5 mg total) by mouth every 8 (eight) hours as needed for anxiety. 10 tablet 0 05/13/2015 at Unknown time  . polyethylene glycol (MIRALAX / GLYCOLAX) packet Take 17 g by mouth daily. 14 each 0 Past Month at Unknown time  . predniSONE (DELTASONE) 1 MG tablet Take 2 mg by mouth daily with breakfast.   05/14/2015 at 0730  . saccharomyces boulardii (FLORASTOR) 250 MG capsule Take 1 capsule (250 mg total) by mouth 2 (two) times daily. 30 capsule 0 05/14/2015 at 0730  . senna (SENOKOT) 8.6 MG TABS tablet Take 1 tablet (8.6 mg total) by mouth at bedtime. 120 each 0 Past Month at Unknown time  . sodium chloride (OCEAN) 0.65 % SOLN nasal spray Place 1 spray into both nostrils as needed for congestion. 15 mL 0 Past Week at Unknown time  . ST JOHNS WORT PO Take 1 capsule by mouth 2 (two) times daily.   05/13/2015 at Unknown time  . tiZANidine (ZANAFLEX) 4 MG tablet Take 0.5-1 tablets (2-4 mg total) by mouth every 8 (eight) hours as needed for muscle spasms. 30 tablet 0 05/13/2015 at Unknown time  . albuterol (PROVENTIL HFA;VENTOLIN HFA) 108 (90 BASE) MCG/ACT inhaler Inhale 2 puffs into the lungs every 4 (four) hours as needed for wheezing (wheezing).   05/14/2015 at 0730  . cyanocobalamin (,VITAMIN B-12,) 1000 MCG/ML injection Inject 1,000 mcg into the muscle every 6 (six) weeks.   More than a month at Unknown time  . potassium chloride SA (K-DUR,KLOR-CON) 20 MEQ tablet Take  1 tablet (20 mEq total) by mouth daily. 30 tablet 6 Unknown at Unknown time    Results for orders placed or performed during the hospital encounter of 05/14/15 (from the past 48 hour(s))  CBC     Status: Abnormal   Collection Time: 05/14/15  9:30 AM  Result Value Ref Range   WBC 9.7 4.0 - 10.5 K/uL   RBC 3.33 (L) 3.87 - 5.11 MIL/uL   Hemoglobin 8.8 (L) 12.0 - 15.0 g/dL   HCT 29.3 (L) 36.0 - 46.0 %   MCV 88.0 78.0 - 100.0 fL   MCH 26.4 26.0 - 34.0 pg   MCHC 30.0 30.0 - 36.0 g/dL   RDW 16.1 (H) 11.5 - 15.5 %   Platelets 484 (H) 150 - 400 K/uL  Protime-INR     Status: None   Collection Time: 05/14/15  9:30 AM  Result Value Ref Range   Prothrombin Time 15.0 11.6 - 15.2 seconds   INR 1.16 0.00 - 1.49  I-STAT 3, venous blood gas (G3P V)     Status: Abnormal   Collection Time: 05/14/15 12:04 PM  Result Value Ref Range   pH, Ven 7.360 (H) 7.250 - 7.300   pCO2, Ven 40.9 (L) 45.0 - 50.0 mmHg   pO2, Ven 29.0 (LL) 30.0 - 45.0 mmHg   Bicarbonate 23.1 20.0 - 24.0 mEq/L   TCO2 24 0 - 100 mmol/L   O2 Saturation 53.0 %   Acid-base deficit 2.0 0.0 - 2.0 mmol/L   Sample type VENOUS   I-STAT 3, arterial blood gas (G3+)     Status: Abnormal   Collection Time: 05/14/15 12:09 PM  Result Value Ref Range   pH, Arterial 7.399 7.350 - 7.450   pCO2 arterial 34.2 (L) 35.0 - 45.0 mmHg   pO2, Arterial 89.0 80.0 - 100.0 mmHg  Bicarbonate 21.1 20.0 - 24.0 mEq/L   TCO2 22 0 - 100 mmol/L   O2 Saturation 97.0 %   Acid-base deficit 3.0 (H) 0.0 - 2.0 mmol/L   Sample type ARTERIAL     No results found.  Review of Systems  Constitutional: Positive for malaise/fatigue. Negative for fever and chills.  Respiratory: Positive for cough, shortness of breath and wheezing.   Cardiovascular: Positive for palpitations, orthopnea and leg swelling.  Gastrointestinal: Positive for constipation.  Musculoskeletal: Positive for back pain and joint pain (left shoulder).       Chronic pain  Neurological: Positive for  dizziness and weakness.   Blood pressure 111/66, pulse 80, temperature 98 F (36.7 C), temperature source Oral, resp. rate 19, height 5\' 3"  (1.6 m), weight 140 lb (63.504 kg), SpO2 93 %. Physical Exam  Vitals reviewed. Constitutional: She is oriented to person, place, and time.  Cachetic, elderly, appears older than stated age  HENT:  Head: Normocephalic and atraumatic.  Eyes: EOM are normal.  Neck: Neck supple. No thyromegaly present.  Cardiovascular:  Murmur (3/6 crescendo/ decrescendo systolic murmur) heard. Respiratory: She has wheezes.  Increased WOB with use of accessory muscles  GI: Soft. There is no tenderness.  Musculoskeletal: She exhibits no edema.  Left arm in sling  Lymphadenopathy:    She has no cervical adenopathy.  Neurological: She is alert and oriented to person, place, and time. No cranial nerve deficit.  Left arm eval limited by injury, otherwise motor intact  Skin: Skin is warm and dry.  Psychiatric: She has a normal mood and affect.    Assessment/Plan:  81 yo woman with left main/ 3 vessel CAD found at catheterization as part of her work up for preoperative clearance for left arm surgery.  Unfortunately she is a poor candidate for CABG due to severe COPD and general frailty. I do not think she would be able to wean from a ventilator after CABG, and she would be at high risk for numerous perioperative complications, including stroke, bleeding, infections, sternal healing problems, death. Overall I feel her risk is prohibitive.  Options include medical management v high risk PCI. Will d/w Dr. Angelena Form  Her family was not present when I saw Mrs. Quentin Cornwall, but I will be happy to speak with them if they desire.  Melrose Nakayama 05/14/2015, 6:07 PM

## 2015-05-14 NOTE — H&P (View-Only) (Signed)
Cardiology Office Note   Date:  05/08/2015   ID:  ICA DAYE, DOB 1934-02-24, MRN [id number]  PCP:  Martinique, [name], MD    Chief Complaint  Patient presents with  . New Evaluation    cardiac arrythmia      History of Present Illness: [name] is a 81 y.o. female who presents for preoperative cardiac clearance for ORIF of humerus fracture.  She has a history of HTN, chronic diastolic CHF, GERD and COPD.  She has a remote history of tobacco use.  For the past 2 months, she has been complaining of rapid onset of palpitations associated with neck pain and left arm discomfort.  This usually lasts about 5 minutes.  She denies any pain in her chest.  She has chronic SOB.  She has occasional vertigo but no syncope.  She occasionally has some LE edema.  She recently had a mechanical fall and suffered a left humeral fracture.  She had an ORIF done but apparently needs more surgery that will take at least 2 hours under general anesthesia.  Of note her BNP was elevated at 707 and chest xray showed pulmonary edema.  She also has a history of mild AS.      Past Medical History  Diagnosis Date  . Pernicious anemia   . Osteoarthritis   . Fibromyalgia   . Skin lesion     chronic calcific cutaneous lesions  . HX of multiple bleeding ulcers 09/01/2011  . Hypertension     takes Atenolol daily  . Emphysema   . Bronchitis     hx  . Adenocarcinoma of gastroesophageal junction   . Dizziness     d/t crystals in ears that "roll around"and can't get them out  . Back pain   . Osteoporosis   . Scoliosis   . Psoriasis   . H/O hiatal hernia   . GERD (gastroesophageal reflux disease)     takes Aciphex daily  . Gastric ulcer   . Constipation     since Chemo and Radiation;finished up in Nov 2012  . Diverticulosis   . History of colonic polyps   . Shingles     broke out 2wks ago;has been on meds and to finish up tomorrow  . Kidney stone 25+yrs ago  . Pernicious  anemia   . Vitamin B deficiency   . Complication of anesthesia     shortness of breath and pain with gas   . COPD (chronic obstructive pulmonary disease)   . Shortness of breath     with exertion   . Blood transfusion     Past Surgical History  Procedure Laterality Date  . Right hip replacement  2001  . Tonsillectomy      as a child  . Tubal ligation  1975  . Appendectomy  1975  . Cataract surgery  10+yrs ago  . Esophagogastroduodenoscopy    . Colonoscopy    . Extubation  12/20/2011       . Partial esophagectomy  12/20/2011    Procedure: ESOPHAGECTOMY PARTIAL;  Surgeon: Pierre Bali, MD;  Location: [facility];  Service: Thoracic;  Laterality: N/A;  . Total hip arthroplasty  06/30/2012    Procedure: TOTAL HIP ARTHROPLASTY ANTERIOR APPROACH;  Surgeon: Mcarthur Rossetti, MD;  Location: [facility];  Service: Orthopedics;  Laterality: Left;  Left total hip arthroplasty  . Shoulder closed reduction  04/26/2015    Procedure: CLOSED MANIPULATION SHOULDER;  Surgeon: Meredith Pel, MD;  Location: [facility];  Service: Orthopedics;;     Current Outpatient Prescriptions  Medication Sig Dispense Refill  . acetaminophen (TYLENOL) 500 MG tablet Take 2 tablets (1,000 mg total) by mouth 3 (three) times daily. 30 tablet 0  . albuterol (PROVENTIL HFA;VENTOLIN HFA) 108 (90 BASE) MCG/ACT inhaler Inhale 2 puffs into the lungs every 4 (four) hours as needed for wheezing (wheezing).    Marland Kitchen albuterol (PROVENTIL) (2.5 MG/3ML) 0.083% nebulizer solution Take 3 mLs (2.5 mg total) by nebulization every 2 (two) hours as needed for wheezing or shortness of breath. 75 mL 12  . amLODipine (NORVASC) 2.5 MG tablet Take 2.5 mg by mouth every morning.     Marland Kitchen aspirin EC 81 MG tablet Take 81 mg by mouth every morning.     Marland Kitchen atenolol (TENORMIN) 50 MG tablet Take 50 mg by mouth 2 (two) times daily.    . cyanocobalamin (,VITAMIN B-12,) 1000 MCG/ML injection Inject 1,000 mcg into the muscle every 6 (six) weeks.    .  ergocalciferol (VITAMIN D2) 50000 UNITS capsule Take 50,000 Units by mouth every 14 (fourteen) days.     Marland Kitchen esomeprazole (NEXIUM) 20 MG capsule Take 20 mg by mouth daily at 12 noon.    . Fluticasone Furoate-Vilanterol 200-25 MCG/INH AEPB Inhale 2 puffs into the lungs every morning.    . furosemide (LASIX) 20 MG tablet Take 20 mg by mouth every morning.     Marland Kitchen guaiFENesin (MUCINEX) 600 MG 12 hr tablet Take 1 tablet (600 mg total) by mouth 2 (two) times daily.    . hydroxypropyl methylcellulose (ISOPTO TEARS) 2.5 % ophthalmic solution Place 1 drop into both eyes 3 (three) times daily as needed for dry eyes.    Marland Kitchen ibuprofen (ADVIL,MOTRIN) 400 MG tablet Take 1 tablet (400 mg total) by mouth every 8 (eight) hours as needed (pain). 30 tablet 0  . ipratropium-albuterol (DUONEB) 0.5-2.5 (3) MG/3ML SOLN Take 3 mLs by nebulization every 6 (six) hours. 360 mL   . Lactobacillus Rhamnosus, GG, (CULTURELLE) CAPS Take 1 capsule by mouth every morning.    Marland Kitchen lisinopril (PRINIVIL,ZESTRIL) 40 MG tablet Take 40 mg by mouth every evening.     . loratadine (CLARITIN) 10 MG tablet Take 10 mg by mouth every morning.     Marland Kitchen LORazepam (ATIVAN) 0.5 MG tablet Take 1 tablet (0.5 mg total) by mouth every 8 (eight) hours as needed for anxiety. 10 tablet 0  . polyethylene glycol (MIRALAX / GLYCOLAX) packet Take 17 g by mouth daily. 14 each 0  . predniSONE (DELTASONE) 1 MG tablet Take 2 mg by mouth daily with breakfast.    . saccharomyces boulardii (FLORASTOR) 250 MG capsule Take 1 capsule (250 mg total) by mouth 2 (two) times daily. 30 capsule 0  . senna (SENOKOT) 8.6 MG TABS tablet Take 1 tablet (8.6 mg total) by mouth at bedtime. 120 each 0  . sodium chloride (OCEAN) 0.65 % SOLN nasal spray Place 1 spray into both nostrils as needed for congestion. 15 mL 0  . ST JOHNS WORT PO Take 1 capsule by mouth 2 (two) times daily.    Marland Kitchen tiZANidine (ZANAFLEX) 4 MG tablet Take 0.5-1 tablets (2-4 mg total) by mouth every 8 (eight) hours as needed  for muscle spasms. 30 tablet 0  . white petrolatum (VASELINE) GEL Apply 1 application topically as needed (anterior nares to prevent nose bleed). 106 g 0  No current facility-administered medications for this visit.    Allergies:   Keflex; Nulecit; Biaxin; Fentanyl; Spiriva handihaler; Tramadol; Adhesive; Clarithromycin; Darvon; Epinephrine; Morphine and related; Oxycodone; Sulfa antibiotics; and Vicodin    Social History:  The patient  reports that she quit smoking about 23 years ago. Her smoking use included Cigarettes. She smoked 3.00 packs per day. She has never used smokeless tobacco. She reports that she does not drink alcohol or use illicit drugs.   Family History:  The patient's family history includes Breast cancer in her paternal aunt, paternal aunt, and paternal aunt; Cancer in her maternal aunt and maternal grandmother. There is no history of Anesthesia problems, Hypotension, Malignant hyperthermia, or Pseudochol deficiency.    ROS:  Please see the history of present illness.   Otherwise, review of systems are positive for none.   All other systems are reviewed and negative.    PHYSICAL EXAM: VS:  BP 138/66 mmHg  Pulse 89  Ht 5\' 3"  (1.6 m)  Wt 143 lb (64.864 kg)  BMI 25.34 kg/m2 , BMI Body mass index is 25.34 kg/(m^2). GEN: Well nourished, well developed, in no acute distress HEENT: normal Neck: no JVD, carotid bruits, or masses Cardiac: RRR; no  rubs, or gallops,no edema.  2/6 SM at RUSB to LLSB Respiratory:  Crackles at left base GI: soft, nontender, nondistended, + BS MS: no deformity or atrophy Skin: warm and dry, no rash Neuro:  Strength and sensation are intact Psych: euthymic mood, full affect   EKG:  EKG is ordered today. The ekg ordered today demonstrates NSR with nonspecific ST abnormality   Recent Labs: 07/13/2014: Pro B Natriuretic peptide (BNP) 3079.0* 07/14/2014: ALT <5 04/29/2015: B Natriuretic Peptide 707.5*; BUN 21*; Creatinine 0.95; Hemoglobin 8.2*;  Platelets 308; Potassium 3.7; Sodium 141    Lipid Panel No results found for: CHOL, TRIG, HDL, CHOLHDL, VLDL, LDLCALC, LDLDIRECT    Wt Readings from Last 3 Encounters:  05/08/15 143 lb (64.864 kg)  04/27/15 140 lb (63.504 kg)  08/22/14 133 lb 1.6 oz (60.374 kg)       ASSESSMENT AND PLAN:  1.  Palpitations - I will get a heart monitor to assess for arrhythmias 2.  Left arm pain and neck pain associated with palpitations - ? Anginal equivalent.  I will check a 2D echo to assess LVF.   3.  HTN - controlled 4.  COPD 5.  Proximal humerus FX needing preoperative cardiac clearance for ORIF 6.  Heart murmur with history of mild AS - repeat echo to assess for progression 7.  Chronic diastolic CHF with crackles on exam today.  Check BNP and chest xray 8.  Coronary artery calcifications on chest CT 1 year ago - she cannot lift her arm up due to her fracture so nuclear medicine stress test would be difficult.  I will get a coronary CTA with morphology and calcium score to assess further for risk stratification.  Current medicines are reviewed at length with the patient today.  The patient does not have concerns regarding medicines.  The following changes have been made:  no change  Labs/ tests ordered today: See above Assessment and Plan No orders of the defined types were placed in this encounter.     Disposition:   FU with PRN pending results of studies SignedSueanne Margarita, MD  05/08/2015 3:19 PM    [facility] [address], [place], [place]  [address] Phone: [phone]; Fax: [phone]

## 2015-05-14 NOTE — Interval H&P Note (Signed)
History and Physical Interval Note:  05/14/2015 11:31 AM  [name]  has presented today for cardiac cath with the diagnosis of known CAD, CHF. The various methods of treatment have been discussed with the patient and family. After consideration of risks, benefits and other options for treatment, the patient has consented to  Procedure(s): Right/Left Heart Cath and Coronary Angiography (N/A) as a surgical intervention .  The patient's history has been reviewed, patient examined, no change in status, stable for surgery.  I have reviewed the patient's chart and labs.  Questions were answered to the patient's satisfaction.    Cath Lab Visit (complete for each Cath Lab visit)  Clinical Evaluation Leading to the Procedure:   ACS: No.  Non-ACS:    Anginal Classification: No Symptoms  Anti-ischemic medical therapy: Maximal Therapy (2 or more classes of medications)  Non-Invasive Test Results: No non-invasive testing performed  Prior CABG: No previous CABG         Jaspreet Bodner

## 2015-05-14 NOTE — Progress Notes (Signed)
81 year old female with left proximal humerus fracture which is displaced. She had surgery for closed reduction approximately 2 weeks ago. She did tolerate that short general  aesthetic well Reduction was anatomic but she came back into the clinic after having fallen approximately a week after surgery with loss of alignment. Currently the fracture is in a position where it will not heal. She had catheterization today which shows significant coronary artery disease. She has multiple other medical problems all of which make her very high risk for surgery. Nonetheless [name] states that she cannot live with the arm the way it is. Surgical intervention carries very high risk and could be considered palliative in this case. I did discuss with [name] the fact that she may have a cardiac event or stroke event on the operating table. She states she is willing to undergo that risk because her shoulder is very painful. Also discussed with the patient the fact that even though we place a plate and screws in the shoulder to stabilize the fracture that because of her age and osteoporosis it may not heal. All these risks are discussed with the patient. Patient understands the risk and benefits of operative and nonoperative therapy and desires to proceed with operative fixation. We will plan that for tomorrow. All questions answered

## 2015-05-15 ENCOUNTER — Inpatient Hospital Stay (HOSPITAL_COMMUNITY): Payer: Medicare Other

## 2015-05-15 ENCOUNTER — Encounter (HOSPITAL_COMMUNITY)
Admission: RE | Disposition: A | Discharge status: Skilled Nursing Facility | Payer: Self-pay | Source: Ambulatory Visit | Attending: Cardiology

## 2015-05-15 ENCOUNTER — Inpatient Hospital Stay (HOSPITAL_COMMUNITY): Payer: Medicare Other | Admitting: Anesthesiology

## 2015-05-15 DIAGNOSIS — S4292XA Fracture of left shoulder girdle, part unspecified, initial encounter for closed fracture: Secondary | ICD-10-CM | POA: Diagnosis present

## 2015-05-15 HISTORY — PX: ORIF HUMERUS FRACTURE: SHX2126

## 2015-05-15 LAB — BASIC METABOLIC PANEL
Anion gap: 11 (ref 5–15)
BUN: 11 mg/dL (ref 6–20)
CALCIUM: 9.2 mg/dL (ref 8.9–10.3)
CHLORIDE: 103 mmol/L (ref 101–111)
CO2: 23 mmol/L (ref 22–32)
Creatinine, Ser: 0.84 mg/dL (ref 0.44–1.00)
GFR calc Af Amer: >60 mL/min (ref >60)
Glucose, Bld: 84 mg/dL (ref 65–99)
POTASSIUM: 4.2 mmol/L (ref 3.5–5.1)
Sodium: 137 mmol/L (ref 135–145)

## 2015-05-15 LAB — CBC
HCT: 32.3 % — ABNORMAL LOW (ref 36.0–46.0)
Hemoglobin: 9.6 g/dL — ABNORMAL LOW (ref 12.0–15.0)
MCH: 25.9 pg — AB (ref 26.0–34.0)
MCHC: 29.7 g/dL — ABNORMAL LOW (ref 30.0–36.0)
MCV: 87.1 fL (ref 78.0–100.0)
Platelets: 465 10*3/uL — ABNORMAL HIGH (ref 150–400)
RBC: 3.71 MIL/uL — ABNORMAL LOW (ref 3.87–5.11)
RDW: 16.4 % — ABNORMAL HIGH (ref 11.5–15.5)
WBC: 8.8 10*3/uL (ref 4.0–10.5)

## 2015-05-15 LAB — SURGICAL PCR SCREEN
MRSA, PCR: NEGATIVE
STAPHYLOCOCCUS AUREUS: POSITIVE — AB

## 2015-05-15 LAB — PREPARE RBC (CROSSMATCH)

## 2015-05-15 SURGERY — OPEN REDUCTION INTERNAL FIXATION (ORIF) PROXIMAL HUMERUS FRACTURE
Anesthesia: General | Site: Shoulder | Laterality: Left

## 2015-05-15 MED ORDER — SODIUM CHLORIDE 0.9 % IV SOLN
10.0000 mg | INTRAVENOUS | Status: DC | PRN
  Administered 2015-05-15: 15 ug/min via INTRAVENOUS

## 2015-05-15 MED ORDER — MIDAZOLAM HCL 2 MG/2ML IJ SOLN: 0.5000 mg | Freq: Once | INTRAMUSCULAR | Status: DC | PRN

## 2015-05-15 MED ORDER — NEOSTIGMINE METHYLSULFATE 10 MG/10ML IV SOLN
INTRAVENOUS | Status: DC | PRN
  Administered 2015-05-15: 1 mg via INTRAVENOUS
  Administered 2015-05-15: 2 mg via INTRAVENOUS

## 2015-05-15 MED ORDER — SODIUM CHLORIDE 0.9 % IV SOLN
INTRAVENOUS | Status: DC | PRN
Start: 2015-05-15 — End: 2015-05-15
  Administered 2015-05-15: 13:00:00 via INTRAVENOUS

## 2015-05-15 MED ORDER — NITROGLYCERIN IN D5W 200-5 MCG/ML-% IV SOLN
INTRAVENOUS | Status: DC | PRN
  Administered 2015-05-15: 5 ug/min via INTRAVENOUS

## 2015-05-15 MED ORDER — CLINDAMYCIN PHOSPHATE 900 MG/50ML IV SOLN
INTRAVENOUS | Status: AC
  Administered 2015-05-15: 900 mg via INTRAVENOUS
  Filled 2015-05-15: qty 50

## 2015-05-15 MED ORDER — ROCURONIUM BROMIDE 100 MG/10ML IV SOLN
INTRAVENOUS | Status: DC | PRN
  Administered 2015-05-15: 15 mg via INTRAVENOUS
  Administered 2015-05-15: 5 mg via INTRAVENOUS
  Administered 2015-05-15: 35 mg via INTRAVENOUS

## 2015-05-15 MED ORDER — METOCLOPRAMIDE HCL 5 MG PO TABS: 5.0000 mg | ORAL_TABLET | Freq: Three times a day (TID) | ORAL | Status: DC | PRN

## 2015-05-15 MED ORDER — LACTATED RINGERS IV SOLN
INTRAVENOUS | Status: DC
  Administered 2015-05-15: 11:00:00 via INTRAVENOUS

## 2015-05-15 MED ORDER — 0.9 % SODIUM CHLORIDE (POUR BTL) OPTIME
TOPICAL | Status: DC | PRN
  Administered 2015-05-15 (×2): 1000 mL

## 2015-05-15 MED ORDER — GLYCOPYRROLATE 0.2 MG/ML IJ SOLN
INTRAMUSCULAR | Status: DC | PRN
  Administered 2015-05-15 (×2): .2 mg via INTRAVENOUS

## 2015-05-15 MED ORDER — MENTHOL 3 MG MT LOZG
1.0000 | LOZENGE | OROMUCOSAL | Status: DC | PRN
  Administered 2015-05-18: 3 mg via ORAL

## 2015-05-15 MED ORDER — ACETAMINOPHEN 325 MG PO TABS
650.0000 mg | ORAL_TABLET | Freq: Four times a day (QID) | ORAL | Status: DC | PRN
  Administered 2015-05-16 – 2015-05-20 (×14): 650 mg via ORAL
  Filled 2015-05-15 (×15): qty 2

## 2015-05-15 MED ORDER — FENTANYL CITRATE (PF) 100 MCG/2ML IJ SOLN
INTRAMUSCULAR | Status: DC | PRN
  Administered 2015-05-15 (×2): 50 ug via INTRAVENOUS
  Administered 2015-05-15: 25 ug via INTRAVENOUS

## 2015-05-15 MED ORDER — LIDOCAINE HCL (CARDIAC) 20 MG/ML IV SOLN
INTRAVENOUS | Status: DC | PRN
  Administered 2015-05-15: 20 mg via INTRAVENOUS

## 2015-05-15 MED ORDER — SODIUM CHLORIDE 0.9 % IV SOLN: 10.0000 mL/h | Freq: Once | INTRAVENOUS | Status: DC

## 2015-05-15 MED ORDER — METOCLOPRAMIDE HCL 5 MG/ML IJ SOLN
5.0000 mg | Freq: Three times a day (TID) | INTRAMUSCULAR | Status: DC | PRN
Start: 2015-05-15 — End: 2015-05-21

## 2015-05-15 MED ORDER — PHENOL 1.4 % MT LIQD: 1.0000 | OROMUCOSAL | Status: DC | PRN

## 2015-05-15 MED ORDER — ONDANSETRON HCL 4 MG/2ML IJ SOLN
INTRAMUSCULAR | Status: DC | PRN
  Administered 2015-05-15: 4 mg via INTRAVENOUS

## 2015-05-15 MED ORDER — ONDANSETRON HCL 4 MG/2ML IJ SOLN
4.0000 mg | Freq: Four times a day (QID) | INTRAMUSCULAR | Status: DC | PRN
  Administered 2015-05-16 – 2015-05-17 (×3): 4 mg via INTRAVENOUS
  Filled 2015-05-15 (×4): qty 2

## 2015-05-15 MED ORDER — CLONIDINE HCL (ANALGESIA) 100 MCG/ML EP SOLN
150.0000 ug | EPIDURAL | Status: AC
  Administered 2015-05-15: .5 mL via INTRA_ARTICULAR
  Filled 2015-05-15: qty 1.5

## 2015-05-15 MED ORDER — PROPOFOL 10 MG/ML IV BOLUS
INTRAVENOUS | Status: DC | PRN
  Administered 2015-05-15: 20 mg via INTRAVENOUS
  Administered 2015-05-15: 50 mg via INTRAVENOUS

## 2015-05-15 MED ORDER — PROMETHAZINE HCL 25 MG/ML IJ SOLN: 6.2500 mg | INTRAMUSCULAR | Status: DC | PRN

## 2015-05-15 MED ORDER — VANCOMYCIN HCL IN DEXTROSE 1-5 GM/200ML-% IV SOLN
1000.0000 mg | Freq: Two times a day (BID) | INTRAVENOUS | Status: AC
  Administered 2015-05-16: 1000 mg via INTRAVENOUS
  Filled 2015-05-15 (×2): qty 200

## 2015-05-15 MED ORDER — BUPIVACAINE HCL (PF) 0.25 % IJ SOLN
INTRAMUSCULAR | Status: AC
  Filled 2015-05-15: qty 30

## 2015-05-15 MED ORDER — BUPIVACAINE HCL (PF) 0.25 % IJ SOLN
INTRAMUSCULAR | Status: DC | PRN
  Administered 2015-05-15: 30 mL

## 2015-05-15 MED ORDER — FENTANYL CITRATE (PF) 100 MCG/2ML IJ SOLN: 25.0000 ug | INTRAMUSCULAR | Status: DC | PRN

## 2015-05-15 MED ORDER — ACETAMINOPHEN 650 MG RE SUPP: 650.0000 mg | Freq: Four times a day (QID) | RECTAL | Status: DC | PRN

## 2015-05-15 MED ORDER — ONDANSETRON HCL 4 MG PO TABS
4.0000 mg | ORAL_TABLET | Freq: Four times a day (QID) | ORAL | Status: DC | PRN
  Administered 2015-05-15 – 2015-05-20 (×6): 4 mg via ORAL
  Filled 2015-05-15 (×6): qty 1

## 2015-05-15 MED FILL — Lidocaine HCl Local Preservative Free (PF) Inj 1%: INTRAMUSCULAR | Qty: 30 | Status: AC

## 2015-05-15 MED FILL — Heparin Sodium (Porcine) 2 Unit/ML in Sodium Chloride 0.9%: INTRAMUSCULAR | Qty: 1000 | Status: AC

## 2015-05-15 SURGICAL SUPPLY — 82 items
BANDAGE ELASTIC 4 VELCRO ST LF (GAUZE/BANDAGES/DRESSINGS) IMPLANT
BANDAGE ELASTIC 6 VELCRO ST LF (GAUZE/BANDAGES/DRESSINGS) IMPLANT
BENZOIN TINCTURE PRP APPL 2/3 (GAUZE/BANDAGES/DRESSINGS) IMPLANT
BIT DRILL 3.2 (BIT) ×3
BIT DRILL 3.2XCALB NS DISP (BIT) ×1 IMPLANT
BIT DRILL CALIBRATED 2.7 (BIT) ×2 IMPLANT
BIT DRILL CALIBRATED 2.7MM (BIT) ×1
BIT DRL 3.2XCALB NS DISP (BIT) ×1
BNDG COHESIVE 4X5 TAN STRL (GAUZE/BANDAGES/DRESSINGS) ×3 IMPLANT
BONE VOID FILLER CERAMENT (Orthopedic Implant) ×3 IMPLANT
CLOSURE STERI-STRIP 1/2X4 (GAUZE/BANDAGES/DRESSINGS) ×1
CLSR STERI-STRIP ANTIMIC 1/2X4 (GAUZE/BANDAGES/DRESSINGS) ×2 IMPLANT
COVER SURGICAL LIGHT HANDLE (MISCELLANEOUS) ×3 IMPLANT
DRAIN PENROSE 1/2X12 LTX STRL (WOUND CARE) IMPLANT
DRAPE C-ARM 42X72 X-RAY (DRAPES) ×3 IMPLANT
DRAPE IMP U-DRAPE 54X76 (DRAPES) ×3 IMPLANT
DRAPE U-SHAPE 47X51 STRL (DRAPES) ×3 IMPLANT
DRSG AQUACEL AG ADV 3.5X10 (GAUZE/BANDAGES/DRESSINGS) ×3 IMPLANT
DRSG PAD ABDOMINAL 8X10 ST (GAUZE/BANDAGES/DRESSINGS) IMPLANT
DURAPREP 26ML APPLICATOR (WOUND CARE) ×3 IMPLANT
ELECT REM PT RETURN 9FT ADLT (ELECTROSURGICAL) ×3
ELECTRODE REM PT RTRN 9FT ADLT (ELECTROSURGICAL) ×1 IMPLANT
FACESHIELD WRAPAROUND (MASK) IMPLANT
GAUZE SPONGE 4X4 12PLY STRL (GAUZE/BANDAGES/DRESSINGS) IMPLANT
GAUZE XEROFORM 5X9 LF (GAUZE/BANDAGES/DRESSINGS) IMPLANT
GLOVE BIOGEL PI IND STRL 8 (GLOVE) ×1 IMPLANT
GLOVE BIOGEL PI INDICATOR 8 (GLOVE) ×2
GLOVE SURG ORTHO 8.0 STRL STRW (GLOVE) ×3 IMPLANT
GOWN SPEC L3 XXLG W/TWL (GOWN DISPOSABLE) ×3 IMPLANT
GOWN STRL REUS W/ TWL LRG LVL3 (GOWN DISPOSABLE) ×2 IMPLANT
GOWN STRL REUS W/ TWL XL LVL3 (GOWN DISPOSABLE) ×1 IMPLANT
GOWN STRL REUS W/TWL LRG LVL3 (GOWN DISPOSABLE) ×4
GOWN STRL REUS W/TWL XL LVL3 (GOWN DISPOSABLE) ×2
K-WIRE 2X5 SS THRDED S3 (WIRE) ×9
KIT BASIN OR (CUSTOM PROCEDURE TRAY) ×3 IMPLANT
KIT ROOM TURNOVER OR (KITS) ×3 IMPLANT
KWIRE 2X5 SS THRDED S3 (WIRE) ×3 IMPLANT
LOOP VESSEL MAXI BLUE (MISCELLANEOUS) ×3 IMPLANT
MANIFOLD NEPTUNE II (INSTRUMENTS) ×3 IMPLANT
NEEDLE 21X1 OR PACK (NEEDLE) IMPLANT
NS IRRIG 1000ML POUR BTL (IV SOLUTION) ×3 IMPLANT
PACK SHOULDER (CUSTOM PROCEDURE TRAY) ×3 IMPLANT
PACK UNIVERSAL I (CUSTOM PROCEDURE TRAY) ×3 IMPLANT
PAD ARMBOARD 7.5X6 YLW CONV (MISCELLANEOUS) IMPLANT
PAD CAST 4YDX4 CTTN HI CHSV (CAST SUPPLIES) IMPLANT
PADDING CAST COTTON 4X4 STRL (CAST SUPPLIES)
PEG LOCKING 3.2MMX44 (Peg) ×3 IMPLANT
PEG LOCKING 3.2MMX56 (Peg) ×3 IMPLANT
PEG LOCKING 3.2X32 (Peg) ×3 IMPLANT
PEG LOCKING 3.2X34 (Screw) ×6 IMPLANT
PEG LOCKING 3.2X38 (Screw) ×3 IMPLANT
PEG LOCKING 3.2X48 (Peg) ×3 IMPLANT
PEG LOCKING 3.2X50 (Screw) ×3 IMPLANT
PEG LOCKING 3.2X52 (Peg) ×3 IMPLANT
PENCIL BUTTON HOLSTER BLD 10FT (ELECTRODE) ×3 IMPLANT
PLATE PROX HUMERUS HI LT 4H (Plate) ×3 IMPLANT
SCREW LOW PROF TIS 3.5X28MM (Screw) ×3 IMPLANT
SCREW LP NL T15 3.5X22 (Screw) ×3 IMPLANT
SCREW LP NL T15 3.5X24 (Screw) ×3 IMPLANT
SCREW LP NL T15 3.5X26 (Screw) ×3 IMPLANT
SCREW PEG LOCK 3.2X30MM (Screw) ×3 IMPLANT
SLEEVE MEASURING 3.2 (BIT) ×3 IMPLANT
SLING ARM IMMOBILIZER MED (SOFTGOODS) ×3 IMPLANT
SPONGE LAP 18X18 X RAY DECT (DISPOSABLE) ×3 IMPLANT
SPONGE LAP 4X18 X RAY DECT (DISPOSABLE) IMPLANT
STAPLER VISISTAT 35W (STAPLE) IMPLANT
STOCKINETTE IMPERVIOUS 9X36 MD (GAUZE/BANDAGES/DRESSINGS) IMPLANT
SUCTION FRAZIER TIP 10 FR DISP (SUCTIONS) ×3 IMPLANT
SUT BONE WAX W31G (SUTURE) ×3 IMPLANT
SUT MNCRL AB 3-0 PS2 18 (SUTURE) ×3 IMPLANT
SUT SILK 2 0 TIES 10X30 (SUTURE) ×3 IMPLANT
SUT VIC AB 0 CT1 27 (SUTURE) ×4
SUT VIC AB 0 CT1 27XBRD ANBCTR (SUTURE) ×2 IMPLANT
SUT VIC AB 2-0 CT1 27 (SUTURE) ×6
SUT VIC AB 2-0 CT1 TAPERPNT 27 (SUTURE) ×2 IMPLANT
SUT VIC AB 2-0 CTB1 (SUTURE) IMPLANT
TOWEL OR 17X24 6PK STRL BLUE (TOWEL DISPOSABLE) ×3 IMPLANT
TOWEL OR 17X26 10 PK STRL BLUE (TOWEL DISPOSABLE) ×6 IMPLANT
TUBE CONNECTING 12'X1/4 (SUCTIONS)
TUBE CONNECTING 12X1/4 (SUCTIONS) IMPLANT
WATER STERILE IRR 1000ML POUR (IV SOLUTION) ×3 IMPLANT
YANKAUER SUCT BULB TIP NO VENT (SUCTIONS) ×3 IMPLANT

## 2015-05-15 NOTE — Anesthesia Procedure Notes (Signed)
Procedure Name: Intubation Date/Time: 05/15/2015 12:33 PM Performed by: Rush Farmer E Pre-anesthesia Checklist: Patient identified, Emergency Drugs available, Suction available, Patient being monitored and Timeout performed Patient Re-evaluated:Patient Re-evaluated prior to inductionOxygen Delivery Method: Circle system utilized Preoxygenation: Pre-oxygenation with 100% oxygen Intubation Type: IV induction Ventilation: Mask ventilation without difficulty Laryngoscope Size: Mac and 3 Grade View: Grade I Tube type: Oral Tube size: 7.0 mm Number of attempts: 1 Airway Equipment and Method: Stylet Placement Confirmation: ETT inserted through vocal cords under direct vision,  positive ETCO2 and breath sounds checked- equal and bilateral Secured at: 20 cm Tube secured with: Tape Dental Injury: Teeth and Oropharynx as per pre-operative assessment

## 2015-05-15 NOTE — Progress Notes (Signed)
Subjective: Elevated HR last night.  She reports it happens every once and awhile and never last more than 44mins.   Objective: Vital signs in last 24 hours: Temp:  [98 F (36.7 C)-98.4 F (36.9 C)] 98.3 F (36.8 C) (06/09 0300) Pulse Rate:  [0-119] 75 (06/09 0400) Resp:  [0-32] 18 (06/09 0400) BP: (58-170)/(14-96) 111/40 mmHg (06/09 0400) SpO2:  [0 %-100 %] 94 % (06/09 0400) Weight:  [136 lb 11 oz (62 kg)-140 lb (63.504 kg)] 136 lb 11 oz (62 kg) (06/09 0000)    Intake/Output from previous day: 06/08 0701 - 06/09 0700 In: 650.3 [P.O.:120; I.V.:251.3; Blood:279] Out: 2650 [Urine:2650] Intake/Output this shift:    Medications Scheduled Meds: . sodium chloride   Intravenous Once  . amLODipine  2.5 mg Oral q morning - 10a  . aspirin EC  81 mg Oral q morning - 10a  . atenolol  50 mg Oral BID  . atorvastatin  80 mg Oral q1800  . Fluticasone Furoate-Vilanterol  2 puff Inhalation q morning - 10a  . furosemide  40 mg Oral q morning - 10a  . guaiFENesin  600 mg Oral BID  . ipratropium-albuterol  3 mL Nebulization Q6H  . lisinopril  40 mg Oral QPM  . pantoprazole  40 mg Oral Daily  . potassium chloride SA  20 mEq Oral Daily  . predniSONE  2 mg Oral Q breakfast  . senna  1 tablet Oral QHS  . sodium chloride  3 mL Intravenous Q12H   Continuous Infusions:  PRN Meds:.sodium chloride, acetaminophen, albuterol, guaiFENesin-dextromethorphan, loratadine, LORazepam, ondansetron (ZOFRAN) IV, polyethylene glycol, sodium chloride, tiZANidine  PE: General appearance: alert, cooperative and no distress Lungs: clear to auscultation bilaterally Heart: regular rate and rhythm and 2/6 sys MM Extremities: No LEE Pulses: 2+ and symmetric Skin: Warm and dry Neurologic: Grossly normal  Lab Results:   Recent Labs  05/14/15 0930 05/15/15 0249  WBC 9.7 8.8  HGB 8.8* 9.6*  HCT 29.3* 32.3*  PLT 484* 465*   BMET  Recent Labs  05/15/15 0249  NA 137  K 4.2  CL 103  CO2 23    GLUCOSE 84  BUN 11  CREATININE 0.84  CALCIUM 9.2   PT/INR  Recent Labs  05/14/15 0930  LABPROT 15.0  INR 1.16   Coronary angiography results.  Prox RCA to Mid RCA lesion, 99% stenosed.  Dist RCA lesion, 40% stenosed.  Ost RPDA lesion, 80% stenosed.  Ost LM to LM lesion, 75% stenosed.  1. 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 Moderately severe stenosis ostial left main artery 3. Severe stenosis mid RCA 4. Mild LV systolic dysfunction 5. Pulmonary HTN  Recommendations: This a complex situation. She is an elderly female with left arm fracture with need for surgical correction. She has no angina but is found to have severe multi-vessel CAD. Her CAD would be best approached with bypass surgery but she is a poor candidate for CABG given advanced age, severe COPD, chronic steroid use and poor mobility. She is also a poor candidate for stenting given the location of the left main disease and history of chronic anemia. Her RCA could be easily treated with PCI. The moderately severe left main stenosis would be more difficult to treat with PCI.   I will admit to telemetry. I see three options here. The first option would be CABG so I will ask CT surgery to see her but I do not think  she is a good candidate for an open chest procedure because of reasons outlined above. If CT surgery does not feel that she would be a surgical candidate, would have to consider PCI of the RCA with medical management of the left main lesion. We could also consider palliation with her overall poor functional status and poor prognosis. Will continue home meds. Will not start Plavix until we have a better disposition.   Assessment/Plan 81 yo female with history of severe COPD, pernicious anemia, prior GI bleeding, HTN, esophageal adenocarcinoma, GERD who is admitted today following an outpatient diagnostic cardiac cath. Active Problems:    Coronary artery disease involving  native coronary artery of native heart without angina pectoris   Left Proximal humerous fracture   COPD   ISCM 33-61%   Diastolic dysfunction   AS, moderate   Pulmonary HTN, moderate   PSVT   Severe 3 vessel disease.  Not a candidate for CABG per Dr. Roxan Hockey.  High risk PCI?  ASA, BB.   BP controlled this morning.  Also on amlodipine 2.5, lisinopril 40.  Looks like she had <58min of SVT last night associated with angina in her left arm.  She reports it never last more than 59mins.  On BB.  EKG:  SR, PACs. atrial enlargement.  Appears euvolemic.    Surgery planned for fracture today(See ortho note.).  Pt aware of the risks.     LOS: 1 day    Taura Lamarre PA-C 05/15/2015 7:20 AM

## 2015-05-15 NOTE — Anesthesia Preprocedure Evaluation (Addendum)
Anesthesia Evaluation  Patient identified by MRN, date of birth, ID band Patient awake    Reviewed: Allergy & Precautions, NPO status , Patient's Chart, lab work & pertinent test results  History of Anesthesia Complications Negative for: history of anesthetic complications  Airway Mallampati: II  TM Distance: >3 FB Neck ROM: Full    Dental  (+) Missing, Chipped, Dental Advisory Given   Pulmonary shortness of breath, COPD COPD inhaler and oxygen dependent, former smoker (quit 1993),  breath sounds clear to auscultation  + decreased breath sounds      Cardiovascular hypertension, Pt. on medications and Pt. on home beta blockers - angina+ CAD (75% L main and severe 3v ASCADz: not candidate for CABG or stent) and +CHF + Valvular Problems/Murmurs AS Rhythm:Regular Rate:Normal  05/12/15 ECHO: EF 51-76%, grade 2 diastolic dysfunction, mod AS, mild MR   Neuro/Psych negative neurological ROS     GI/Hepatic Neg liver ROS, GERD-  Medicated and Controlled,Esophageal cancer: s/p esophagectomy    Endo/Other  negative endocrine ROS  Renal/GU negative Renal ROS     Musculoskeletal  (+) Arthritis -, Osteoarthritis,    Abdominal   Peds  Hematology  (+) Blood dyscrasia (Hb 9.6), ,   Anesthesia Other Findings   Reproductive/Obstetrics                          Anesthesia Physical Anesthesia Plan  ASA: IV  Anesthesia Plan: General   Post-op Pain Management:    Induction: Intravenous  Airway Management Planned: Oral ETT  Additional Equipment: Arterial line  Intra-op Plan:   Post-operative Plan: Possible Post-op intubation/ventilation  Informed Consent: I have reviewed the patients History and Physical, chart, labs and discussed the procedure including the risks, benefits and alternatives for the proposed anesthesia with the patient or authorized representative who has indicated his/her understanding and  acceptance.   Dental advisory given and Consent reviewed with POA  Plan Discussed with: CRNA and Surgeon  Anesthesia Plan Comments: (Plan routine monitors, A line, GETA with probable post op ventilation)        Anesthesia Quick Evaluation

## 2015-05-15 NOTE — Progress Notes (Signed)
Discussed at length again this morning with [name] and her son the risk and benefits of surgical intervention. Son voices pretty reasonable understanding of the severity of his mother's medical condition. Both desire to proceed with operative fixation of the fracture in order to allow for better quality of life. CABG not an option because of her overall medical condition stenting also not a great option in terms of being able to fix the shoulder in a somewhat timely fashion. Will proceed with surgical fixation QUESTIONS answered risks and benefits explained discussed and understood

## 2015-05-15 NOTE — Transfer of Care (Signed)
Immediate Anesthesia Transfer of Care Note  Patient: [name]  Procedure(s) Performed: Procedure(s): Left OPEN REDUCTION INTERNAL FIXATION (ORIF) PROXIMAL HUMERUS FRACTURE (Left)  Patient Location: PACU  Anesthesia Type:General  Level of Consciousness: lethargic and responds to stimulation  Airway & Oxygen Therapy: Patient Spontanous Breathing and Patient connected to face mask oxygen  Post-op Assessment: Report given to RN and Post -op Vital signs reviewed and stable  Post vital signs: Reviewed and stable  Last Vitals:  Filed Vitals:   05/15/15 0955  BP: 121/65  Pulse: 90  Temp: 36.7 C  Resp: 20    Complications: No apparent anesthesia complications   SPO2 92 on 4L facemask. BP 211 systolic. Pt easily awakens. Denies pain. VSS.

## 2015-05-15 NOTE — Brief Op Note (Signed)
05/14/2015 - 05/15/2015  3:11 PM  PATIENT:  [name]  81 y.o. female  PRE-OPERATIVE DIAGNOSIS:  proximal humerus fracture  POST-OPERATIVE DIAGNOSIS:  proximal humerus fracture  PROCEDURE:  Procedure(s): OPEN REDUCTION INTERNAL FIXATION (ORIF) PROXIMAL HUMERUS FRACTURE  SURGEON:  Surgeon(s): Meredith Pel, MD  ASSISTANT: Larkin Ina rnfa  ANESTHESIA:   general  EBL: 125 ml    Total I/O In: 525 [I.V.:200; Blood:325] Out: 125 [Blood:125]  BLOOD ADMINISTERED: none  DRAINS: none   LOCAL MEDICATIONS USED:  Marcaine clonidine in skin  SPECIMEN:  No Specimen  COUNTS:  YES  TOURNIQUET:  * No tourniquets in log *  DICTATION: .Other Dictation: Dictation Number [id number]  PLAN OF CARE: Admit to inpatient   PATIENT DISPOSITION:  PACU - hemodynamically stable

## 2015-05-15 NOTE — Anesthesia Postprocedure Evaluation (Signed)
  Anesthesia Post-op Note  Patient: [name]  Procedure(s) Performed: Procedure(s): Left OPEN REDUCTION INTERNAL FIXATION (ORIF) PROXIMAL HUMERUS FRACTURE (Left)  Patient Location: PACU  Anesthesia Type:General  Level of Consciousness: awake and alert   Airway and Oxygen Therapy: Patient Spontanous Breathing  Post-op Pain: none  Post-op Assessment: Post-op Vital signs reviewed, Patient's Cardiovascular Status Stable and Respiratory Function Stable  Post-op Vital Signs: Reviewed  Filed Vitals:   05/15/15 1630  BP:   Pulse: 78  Temp:   Resp: 18    Complications: No apparent anesthesia complications

## 2015-05-15 NOTE — Progress Notes (Addendum)
ANTIBIOTIC CONSULT NOTE - INITIAL  Pharmacy Consult:  Diflucan Indication:  Empiric  Allergies  Allergen Reactions  . Keflex [Cephalexin] Itching    "severe itching"  . Nulecit [Na Ferric Gluc Cplx In Sucrose] Diarrhea, Nausea Only and Other (See Comments)      drop in BP  . Biaxin [Clarithromycin] Itching  . Fentanyl Itching  . Spiriva Handihaler [Tiotropium Bromide Monohydrate] Other (See Comments)    constipation  . Tramadol Itching    Skin burning and itching  . Adhesive [Tape] Itching  . Clarithromycin Itching  . Darvon Itching  . Epinephrine Itching  . Morphine And Related Itching  . Oxycodone Itching  . Sulfa Antibiotics Itching  . Vicodin [Hydrocodone-Acetaminophen] Itching    Can take with Benadryl    Patient Measurements: Height: 5\' 3"  (160 cm) Weight: 136 lb 11 oz (62 kg) IBW/kg (Calculated) : 52.4  Vital Signs: Temp: 98.6 F (37 C) (06/09 1700) Temp Source: Oral (06/09 1700) BP: 99/41 mmHg (06/09 1730) Pulse Rate: 76 (06/09 1730) Intake/Output from previous day: 06/08 0701 - 06/09 0700 In: 650.3 [P.O.:120; I.V.:251.3; Blood:279] Out: 2650 [Urine:2650] Intake/Output from this shift: Total I/O In: 1535 [I.V.:1210; Blood:325] Out: 275 [Urine:150; Blood:125]  Labs:  Recent Labs  05/14/15 0930 05/15/15 0249  WBC 9.7 8.8  HGB 8.8* 9.6*  PLT 484* 465*  CREATININE  --  0.84   Estimated Creatinine Clearance: 45.7 mL/min (by C-G formula based on Cr of 0.84). No results for input(s): VANCOTROUGH, VANCOPEAK, VANCORANDOM, GENTTROUGH, GENTPEAK, GENTRANDOM, TOBRATROUGH, TOBRAPEAK, TOBRARND, AMIKACINPEAK, AMIKACINTROU, AMIKACIN in the last 72 hours.   Microbiology: Recent Results (from the past 720 hour(s))  Surgical pcr screen     Status: Abnormal   Collection Time: 04/26/15  5:14 PM  Result Value Ref Range Status   MRSA, PCR NEGATIVE NEGATIVE Final   Staphylococcus aureus POSITIVE (A) NEGATIVE Final    Comment:        The Xpert SA Assay  (FDA approved for NASAL specimens in patients over 79 years of age), is one component of a comprehensive surveillance program.  Test performance has been validated by [facility] for patients greater than or equal to 79 year old. It is not intended to diagnose infection nor to guide or monitor treatment.   Surgical pcr screen     Status: Abnormal   Collection Time: 05/15/15  8:10 AM  Result Value Ref Range Status   MRSA, PCR NEGATIVE NEGATIVE Final   Staphylococcus aureus POSITIVE (A) NEGATIVE Final    Comment:        The Xpert SA Assay (FDA approved for NASAL specimens in patients over 79 years of age), is one component of a comprehensive surveillance program.  Test performance has been validated by [facility] for patients greater than or equal to 79 year old. It is not intended to diagnose infection nor to guide or monitor treatment.     Medical History: Past Medical History  Diagnosis Date  . Fibromyalgia   . Skin lesion     chronic calcific cutaneous lesions  . HX of multiple bleeding ulcers 09/01/2011  . Hypertension     takes Atenolol daily  . Emphysema   . Dizziness     d/t crystals in ears that "roll around"and can't get them out  . Osteoporosis   . Scoliosis   . Psoriasis   . H/O hiatal hernia   . GERD (gastroesophageal reflux disease)     takes Aciphex daily  . Gastric ulcer   .  Constipation     since Chemo and Radiation;finished up in Nov 2012  . Diverticulosis   . History of colonic polyps   . Shingles     broke out 2wks ago;has been on meds and to finish up tomorrow  . Vitamin B deficiency   . COPD (chronic obstructive pulmonary disease)   . On home oxygen therapy     "2L;  24/7" (05/14/2015)  . Left humeral fracture 04/26/2015  . Scoliosis   . Complication of anesthesia     shortness of breath and pain with gas   . CHF (congestive heart failure)   . Exertional shortness of breath   . Pneumonia 2015 X 3  . Chronic bronchitis     "I about  keep it"  . History of blood transfusion     "related to my anemia"  . Osteoarthritis   . Arthritis     "qwhere" (05/14/2015)  . Chronic upper back pain   . Kidney stone 25+yrs ago    "passed them"  . Adenocarcinoma of gastroesophageal junction   . Pernicious anemia   . Iron deficiency anemia   . Anemia, B12 deficiency     "get shots once/month" (05/14/2015)      Assessment: 81 YOF s/p ORIF proximal humerus fracture on 05/15/15 to start diflucan as empiric therapy.  Patient is on chronic steroid.  Baseline labs reviewed.  Fluc 6/9 >>  6/9 SA PCR - positive   Goal of Therapy:  Infection prevention / Resolution of infection   Plan:  - Diflucan 200mg  IV Q24H - Monitor renal fxn, LOT    Thuy D. Mina Marble, PharmD, BCPS Pager:  [phone] 05/15/2015, 6:06 PM    Pt tolerating PO diet.  Will change to PO.  [employer], Pharm.D., BCPS Clinical Pharmacist Pager [phone] 05/16/2015 1:56 PM

## 2015-05-16 ENCOUNTER — Encounter (HOSPITAL_COMMUNITY): Payer: Self-pay | Admitting: Orthopedic Surgery

## 2015-05-16 MED ORDER — FLUCONAZOLE 200 MG PO TABS
200.0000 mg | ORAL_TABLET | Freq: Every day | ORAL | Status: AC
  Administered 2015-05-16 – 2015-05-18 (×3): 200 mg via ORAL
  Filled 2015-05-16 (×4): qty 1

## 2015-05-16 MED ORDER — FLUCONAZOLE IN SODIUM CHLORIDE 200-0.9 MG/100ML-% IV SOLN
200.0000 mg | INTRAVENOUS | Status: DC
  Filled 2015-05-16: qty 100

## 2015-05-16 NOTE — Clinical Social Work Note (Signed)
Clinical Social Work Assessment  Patient Details  Name: [name] MRN: [id number] Date of Birth: January 26, 1934  Date of referral:  05/16/15               Reason for consult:  Discharge Planning, Facility Placement (Admitted from facility.)                Permission sought to share information with:  Chartered certified accountant granted to share information::  Yes, Verbal Permission Granted  Name::     n/a  Agency::  [employer]  Relationship::  n/a  Contact Information:  n/a  Housing/Transportation Living arrangements for the past 2 months:  [place] of Information:  Patient Patient Interpreter Needed:  None Criminal Activity/Legal Involvement Pertinent to Current Situation/Hospitalization:  No - Comment as needed Significant Relationships:  Adult Children Lives with:  Self, Facility Resident Do you feel safe going back to the place where you live?  Yes Need for family participation in patient care:  No (Coment) (Patient alert and oriented, able to make own decisions.)  Care giving concerns:  Patient expressed no concerns at this time.   Social Worker assessment / plan:  CSW received referral patient admitted from facility [facility]) and to return at time of discharge. CSW met with patient at bedside to discuss discharge disposition. Patient confirmed patient to discharge back to [facility] when medically stable. CSW has provided [facility] with appropriate clinical documentation. FL2 completed and placed on chart for MD signature.  Employment status:  Retired Nurse, adult PT Recommendations:  [place] / Referral to community resources:  [place]  Patient/Family's Response to care:  Patient understanding and agreeable to CSW plan of care.  Patient/Family's Understanding of and Emotional Response to Diagnosis, Current Treatment, and  Prognosis:  Patient understanding and agreeable to CSW plan of care.  Emotional Assessment Appearance:  Appears stated age Attitude/Demeanor/Rapport:  Other (Pleasant.) Affect (typically observed):  Accepting, Pleasant, Appropriate Orientation:  Oriented to Self, Oriented to Place, Oriented to  Time, Oriented to Situation Alcohol / Substance use:  Not Applicable Psych involvement (Current and /or in the community):  No (Comment) (Not appropriate on this admission.)  Discharge Needs  Concerns to be addressed:  No discharge needs identified Readmission within the last 30 days:  Yes Current discharge risk:  None Barriers to Discharge:  No Barriers Identified   Caroline Sauger, LCSW 05/16/2015, 5:55 PM [phone]

## 2015-05-16 NOTE — Evaluation (Signed)
Occupational Therapy Evaluation Patient Details Name: [name] MRN: [id number] DOB: 04-Jul-1934 Today's Date: 05/16/2015    History of Present Illness Pt is a 81 y.o. Female with L proximal humerus fx from fall on 5/21 with closed reduction on 5/22. Pt d/c to SNF and had a fall about a week after surgery with loss of alignment. Pt underwent right/left cardiac cath on 6/8 to clear for surgery. Pt is now s/p L humerus ORIF on 05/15/15. PMH pernicioius anemia, OA, fibromyalgia, HTN, COPD, esophageal adenocarcinoma. Pt also has L thumb proximal phalanx fx from previous fall.    Clinical Impression   PTA pt was at SNF following her previous shoulder reduction. Pt required assist for ADLs but reports independence with functional mobility. Pt has hx of falls. Pt will benefit from acute OT for therapeutic exercise of LUE and to address safe toilet transfers and ADLs. Recommend d/c to SNF.     Follow Up Recommendations  SNF;Supervision/Assistance - 24 hour    Equipment Recommendations  None recommended by OT    Recommendations for Other Services       Precautions / Restrictions Precautions Precautions: Fall;Shoulder Type of Shoulder Precautions: Passive Protocol Shoulder Interventions: Shoulder sling/immobilizer;At all times;Off for dressing/bathing/exercises Precaution Booklet Issued: Yes (comment) Precaution Comments: PROM 0-45 FF, 0-30 ER, No ABD; AROM elbow/wrist/hand. Has L thumb spica.  Required Braces or Orthoses: Sling Restrictions Weight Bearing Restrictions: Yes LUE Weight Bearing: Non weight bearing      Mobility Bed Mobility Overal bed mobility: Needs Assistance Bed Mobility: Rolling Rolling: Min assist         General bed mobility comments: Min A for bed mobility to roll to reposition and don/doff sling.   Transfers                 General transfer comment: NT due to pain and lethargy.          ADL Overall ADL's : Needs  assistance/impaired Eating/Feeding: Set up;Bed level   Grooming: Wash/dry face;Set up;Bed level   Upper Body Bathing: Maximal assistance;Sitting       Upper Body Dressing : Total assistance;Sitting                     General ADL Comments: Pt in bed and limited by pain and lethargy. Agreeable to therapeutic ROM.                Pertinent Vitals/Pain Pain Assessment: 0-10 Pain Score: 8  Pain Location: L shoulder Pain Descriptors / Indicators: Aching;Grimacing Pain Intervention(s): Limited activity within patient's tolerance;Monitored during session;Repositioned     Hand Dominance Right   Extremity/Trunk Assessment Upper Extremity Assessment Upper Extremity Assessment: LUE deficits/detail LUE Deficits / Details: L ORIF LUE: Unable to fully assess due to pain;Unable to fully assess due to immobilization LUE Coordination: decreased fine motor;decreased gross motor   Lower Extremity Assessment Lower Extremity Assessment: Defer to PT evaluation       Communication Communication Communication: No difficulties   Cognition Arousal/Alertness: Lethargic;Suspect due to medications Behavior During Therapy: [facility] for tasks assessed/performed Overall Cognitive Status: Within Functional Limits for tasks assessed                                Home Living Family/patient expects to be discharged to:: Skilled nursing facility  Additional Comments: Pt d/c to SNF after closed reduction.       Prior Functioning/Environment Level of Independence: Needs assistance  Gait / Transfers Assistance Needed: Pt reports independence without use of AD ADL's / Homemaking Assistance Needed: Pt requires assist for all ADLs.         OT Diagnosis: Generalized weakness;Acute pain   OT Problem List: Decreased strength;Decreased range of motion;Decreased activity tolerance;Impaired balance (sitting and/or standing);Decreased  coordination;Decreased knowledge of use of DME or AE;Decreased knowledge of precautions;Impaired UE functional use;Pain   OT Treatment/Interventions: Self-care/ADL training;Patient/family education;Therapeutic activities;DME and/or AE instruction    OT Goals(Current goals can be found in the care plan section) Acute Rehab OT Goals Patient Stated Goal: go back to rehab OT Goal Formulation: With patient Time For Goal Achievement: 05/30/15 Potential to Achieve Goals: Good ADL Goals Pt Will Transfer to Toilet: with supervision;stand pivot transfer Pt/caregiver will Perform Home Exercise Program: Increased ROM;Left upper extremity;With Supervision;With written HEP provided  OT Frequency: Min 2X/week    End of Session Equipment Utilized During Treatment: Other (comment) (sling)  Activity Tolerance: Patient limited by lethargy;Patient limited by pain Patient left: in bed;with call bell/phone within reach;with bed alarm set   Time: 2035-5974 OT Time Calculation (min): 23 min Charges:  OT General Charges $OT Visit: 1 Procedure OT Evaluation $Initial OT Evaluation Tier I: 1 Procedure OT Treatments $Therapeutic Exercise: 8-22 mins G-Codes:    Juluis Rainier 14-Jun-2015, 9:26 AM  Cyndie Chime, OTR/L Occupational Therapist [phone] (pager)

## 2015-05-16 NOTE — Plan of Care (Signed)
Problem: Consults Goal: Diagnosis - Shoulder Surgery Outcome: Completed/Met Date Met:  05/16/15 Total Shoulder Arthroplasty left

## 2015-05-16 NOTE — Progress Notes (Signed)
Patient Name: [name] Date of Encounter: 05/16/2015  Primary Cardiologist: Dr. Radford Pax   Active Problems:   CAD (coronary artery disease), native coronary artery   Coronary artery disease involving native coronary artery of native heart without angina pectoris   Shoulder fracture, left    SUBJECTIVE  Lots of L arm pain from surgery. Denies ever had any chest pain. No SOB.   CURRENT MEDS . sodium chloride   Intravenous Once  . sodium chloride  10 mL/hr Intravenous Once  . amLODipine  2.5 mg Oral q morning - 10a  . aspirin EC  81 mg Oral q morning - 10a  . atenolol  50 mg Oral BID  . atorvastatin  80 mg Oral q1800  . Fluticasone Furoate-Vilanterol  2 puff Inhalation q morning - 10a  . furosemide  40 mg Oral q morning - 10a  . guaiFENesin  600 mg Oral BID  . ipratropium-albuterol  3 mL Nebulization Q6H  . lisinopril  40 mg Oral QPM  . pantoprazole  40 mg Oral Daily  . potassium chloride SA  20 mEq Oral Daily  . predniSONE  2 mg Oral Q breakfast  . senna  1 tablet Oral QHS  . sodium chloride  3 mL Intravenous Q12H    OBJECTIVE  Filed Vitals:   05/15/15 1932 05/15/15 2043 05/16/15 0254 05/16/15 0459  BP:  115/54  119/50  Pulse:  80  85  Temp:  98.1 F (36.7 C)  98 F (36.7 C)  TempSrc:  Oral  Oral  Resp:  18  18  Height:      Weight:      SpO2: 94% 93% 94% 95%    Intake/Output Summary (Last 24 hours) at 05/16/15 0709 Last data filed at 05/16/15 0500  Gross per 24 hour  Intake   1665 ml  Output    775 ml  Net    890 ml   Filed Weights   05/14/15 0847 05/15/15 0000  Weight: 140 lb (63.504 kg) 136 lb 11 oz (62 kg)    PHYSICAL EXAM  General: Pleasant, NAD. Neuro: Alert and oriented X 3. Moves all extremities spontaneously. Psych: Normal affect. HEENT:  Normal  Neck: Supple without bruits or JVD. Lungs:  Resp regular and unlabored, anterior exam CTA. Heart: RRR no s3, s4. 2/6 systolic murmur Abdomen: Soft, non-tender, non-distended, BS + x 4.    Extremities: No clubbing, cyanosis or edema. DP/PT/Radials 2+ and equal bilaterally.  Accessory Clinical Findings  CBC  Recent Labs  05/14/15 0930 05/15/15 0249  WBC 9.7 8.8  HGB 8.8* 9.6*  HCT 29.3* 32.3*  MCV 88.0 87.1  PLT 484* 672*   Basic Metabolic Panel  Recent Labs  05/15/15 0249  NA 137  K 4.2  CL 103  CO2 23  GLUCOSE 84  BUN 11  CREATININE 0.84  CALCIUM 9.2    TELE NSR without significant ventricular ectopy    ECG  No new EKG  Echocardiogram 05/12/2015  LV EF: 40% -  45%  ------------------------------------------------------------------- Indications:   Palpitations 785.1.  ------------------------------------------------------------------- History:  PMH:  Murmur. Chronic obstructive pulmonary disease. Risk factors: Current tobacco use. Hypertension.  ------------------------------------------------------------------- Study Conclusions  - Left ventricle: The cavity size was normal. Wall thickness was normal. Systolic function was mildly to moderately reduced. The estimated ejection fraction was in the range of 40% to 45%. Mild diffuse hypokinesis with no identifiable regional variations. Features are consistent with a pseudonormal left ventricular filling pattern, with concomitant  abnormal relaxation and increased filling pressure (grade 2 diastolic dysfunction). - Aortic valve: There was moderate stenosis. There was trivial regurgitation. - Mitral valve: Moderately to severely fibrotic annulus. Mildly thickened leaflets . There was mild regurgitation directed centrally. Valve area by continuity equation (using LVOT flow): 1.67 cm^2. - Left atrium: The atrium was moderately dilated. - Pulmonary arteries: Systolic pressure was mildly increased. PA peak pressure: 46 mm Hg (S).    Radiology/Studies  Dg Chest 1 View  04/26/2015   CLINICAL DATA:  Patient slipped and fell in the bathroom at home, injuring the  left shoulder. Initial encounter.  EXAM: CHEST  1 VIEW  COMPARISON:  01/08/2015 and earlier.  FINDINGS: Cardiac silhouette upper normal in size, unchanged. Thoracic aorta atherosclerotic, unchanged. Right perihilar oval-shaped opacity, shown on prior CT to represent focal dilation of the intralobar pulmonary artery. Hilar and mediastinal contours otherwise unremarkable. Mildly prominent bronchovascular markings diffusely, unchanged. Minimal linear scarring in the left lung base, unchanged. No new pulmonary parenchymal abnormalities. No visible pleural effusions.  IMPRESSION: No acute cardiopulmonary disease.   Electronically Signed   By: Evangeline Dakin M.D.   On: 04/26/2015 12:56   Dg Chest 2 View  05/09/2015   CLINICAL DATA:  Acute on chronic CHF; abnormal chest exam, shortness of breath, history of COPD.  EXAM: CHEST  2 VIEW  COMPARISON:  Portable chest x-ray of Apr 29, 2015  FINDINGS: The lungs are mildly hyperinflated with hemidiaphragm flattening. There is subsegmental atelectasis in the lingula. Small amounts of pleural fluid blunt the costophrenic angles posteriorly. The cardiac silhouette is enlarged. The pulmonary vascularity is normal. There is prominent thoracic kyphosis. The patient has undergone kyphoplasty of 3 mid to lower thoracic levels.  IMPRESSION: COPD with tiny posterior pleural effusions. Stable mild cardiomegaly. Marked improvement in the appearance of the chest is consistent with resolution of pulmonary edema.   Electronically Signed   By: David  Martinique M.D.   On: 05/09/2015 07:50   Ct Shoulder Left Wo Contrast  04/26/2015   CLINICAL DATA:  Displaced left proximal humeral fracture.  EXAM: CT OF THE LEFT SHOULDER WITHOUT CONTRAST  TECHNIQUE: Multidetector CT imaging was performed according to the standard protocol. Multiplanar CT image reconstructions were also generated.  COMPARISON:  04/26/2015.  FINDINGS: Highly displaced surgical neck fracture of the left proximal humerus with 1.2  cm overlap, 3.1 cm anterior displacement of the distal fracture fragment, and 2.6 cm medial displacement of the distal fracture fragment. Accordingly, the metaphysis and shaft are significantly anteromedial to the humeral head, in the fractured surfaces are not in direct contact. The humeral head continues to articulate with the glenoid in the expected fashion.  There is a fracture the greater tuberosity but this is nondisplaced. No lesser tuberosity fracture fragment. I do not observe a scapular fracture or an adjacent rib fracture. T5 compression fracture may well be chronic. T6, T8, and T9 thoracic compression fractures with vertebral augmentations.  Atherosclerotic aortic arch.  IMPRESSION: 1. Two part surgical neck fracture left proximal humerus with the metaphysis/shaft fragment displaced 3.1 cm anterior and 2.6 cm medially with respect to the femoral head, which continues to articulate with the glenoid. There is 1.2 cm of overlap and the fractured surfaces are not contiguous. Please note that although this is only at 2 part fracture, the current degree of displacement is striking. 2. There is a nondisplaced fracture the greater tuberosity. Because this is nondisplaced, it does not make this fracture a 3 part fracture. 3. T5 compression  fracture, probably chronic. Old T6, T8, and T9 thoracic compression fractures with vertebral augmentations.   Electronically Signed   By: Van Clines M.D.   On: 04/26/2015 16:32   Dg Chest Port 1 View  04/29/2015   CLINICAL DATA:  Patient with recent diagnosis of pneumonia.  EXAM: PORTABLE CHEST - 1 VIEW  COMPARISON:  Chest radiograph 04/26/2015; 01/08/2015  FINDINGS: Enlarged cardiac and mediastinal contours. Bilateral predominately perihilar interstitial pulmonary opacities. Layering bilateral pleural effusions and underlying pulmonary consolidation. Re- demonstrated comminuted fracture through the proximal left humerus.  IMPRESSION: Cardiomegaly with interstitial  pulmonary opacities favored to represent edema.  Layering bilateral pleural effusions and underlying pulmonary consolidation likely representing atelectasis.   Electronically Signed   By: Lovey Newcomer M.D.   On: 04/29/2015 11:41   Dg Shoulder Left  05/15/2015   CLINICAL DATA:  Fracture fixation  EXAM: DG C-ARM 61-120 MIN; LEFT SHOULDER - 2+ VIEW  COMPARISON:  none  FINDINGS: ORIF with metal plate and multiple screws in the left humerus and left proximal humeral shaft in satisfactory position and alignment. Fracture line not identified on the studies. There appears to be bone cement overlying the lateral humeral head.  IMPRESSION: Satisfactory fracture fixation.   Electronically Signed   By: Franchot Gallo M.D.   On: 05/15/2015 15:45   Dg Shoulder Left  04/27/2015   CLINICAL DATA:  Closed manipulation of left humerus dislocation.  EXAM: LEFT SHOULDER - 2+ VIEW; DG C-ARM 1-60 MIN - NRPT [facility]  COMPARISON:  Preoperative radiographs and CT.  FINDINGS: Two fluoroscopic spot images of the left shoulder during closed manipulation demonstrates improved alignment of the proximal humerus fracture. Fluoroscopy time reported 6 seconds.  IMPRESSION: Intraoperative fluoroscopy during close manipulation of left proximal humerus fracture, the fracture is in improved alignment.   Electronically Signed   By: Jeb Levering M.D.   On: 04/27/2015 03:29   Dg Shoulder Left  04/26/2015   CLINICAL DATA:  Patient status post fall with left shoulder pain. History of cancer.  EXAM: LEFT SHOULDER - 2+ VIEW  COMPARISON:  Chest radiograph 04/26/2015; 07/25/2014  FINDINGS: There is a markedly comminuted fracture of the proximal left humerus involving the left humeral head. The humeral head appears to be located on limited views however the proximal left humeral diaphysis appears to be medially dislocated. There is contour abnormality of the mid aspect of the left clavicle which is likely secondary to old healed clavicle fracture.  Kyphoplasty material within the visualized thoracic spine. Multiple soft tissue calcifications.  IMPRESSION: Comminuted proximal left humerus fracture. The proximal humeral diaphysis appears to be dislocated medially to the humeral head.  Likely chronic deformity of the mid body of the left clavicle, potentially secondary to old healed fracture. Recommend correlation with dedicated clavicle views.   Electronically Signed   By: Lovey Newcomer M.D.   On: 04/26/2015 12:53   Dg Shoulder Left Port  05/15/2015   CLINICAL DATA:  81 year old female with a history of ORIF left shoulder  EXAM: LEFT SHOULDER - 1 VIEW  COMPARISON:  Intraoperative images 6 08/2015, plain film 04/27/2015  FINDINGS: Compared to the plain film 04/27/2015 there have been interval changes of open reduction internal fixation of left humeral fracture. Anatomic alignment is maintained. Glenohumeral joint appears congruent.  Multiple clips at the mediastinum, with calcifications of the aortic arch again noted.  Changes of prior vertebroplasty.  IMPRESSION: Interval changes of open reduction internal fixation of left humerus fracture, with anatomic alignment maintained.  Signed,  Dulcy Fanny. Earleen Newport, DO  [facility]  [facility]   Electronically Signed   By: Corrie Mckusick D.O.   On: 05/15/2015 16:18   Dg Shoulder Left Port  04/27/2015   CLINICAL DATA:  Proximal left humerus fracture postreduction.  EXAM: LEFT SHOULDER - 1 VIEW  COMPARISON:  Pre reduction views 1 day prior.  FINDINGS: Improved alignment of the comminuted displaced proximal humerus fracture with persistent but improved displacement of the humeral diaphysis. Glenohumeral alignment is suboptimally assessed, however humeral head is likely normally located. Soft tissue calcifications are again seen.  IMPRESSION: Improved alignment of comminuted displaced proximal humerus fracture. The glenohumeral alignment is suboptimally assessed, however humeral  head is likely normally located.   Electronically Signed   By: Jeb Levering M.D.   On: 04/27/2015 02:18   Dg Hand Complete Left  04/26/2015   CLINICAL DATA:  Left hand pain after fall today. History of arthritis.  EXAM: LEFT HAND - COMPLETE 3+ VIEW  COMPARISON:  None.  FINDINGS: Advanced degenerative changes in the first carpometacarpal joint and IP joints diffusely. Joint space narrowing in the in the CP joints. There is a fracture through the proximal phalanx of the left thumb with mild angulation. No additional fracture noted.  IMPRESSION: Fracture through the base of the left thumb proximal phalanx with mild angulation.   Electronically Signed   By: Rolm Baptise M.D.   On: 04/26/2015 18:19   Dg C-arm 1-60 Min-no Report  04/27/2015   CLINICAL DATA:  Closed manipulation of left humerus dislocation.  EXAM: LEFT SHOULDER - 2+ VIEW; DG C-ARM 1-60 MIN - NRPT [facility]  COMPARISON:  Preoperative radiographs and CT.  FINDINGS: Two fluoroscopic spot images of the left shoulder during closed manipulation demonstrates improved alignment of the proximal humerus fracture. Fluoroscopy time reported 6 seconds.  IMPRESSION: Intraoperative fluoroscopy during close manipulation of left proximal humerus fracture, the fracture is in improved alignment.   Electronically Signed   By: Jeb Levering M.D.   On: 04/27/2015 03:29   Dg C-arm 61-120 Min  05/15/2015   CLINICAL DATA:  Fracture fixation  EXAM: DG C-ARM 61-120 MIN; LEFT SHOULDER - 2+ VIEW  COMPARISON:  none  FINDINGS: ORIF with metal plate and multiple screws in the left humerus and left proximal humeral shaft in satisfactory position and alignment. Fracture line not identified on the studies. There appears to be bone cement overlying the lateral humeral head.  IMPRESSION: Satisfactory fracture fixation.   Electronically Signed   By: Franchot Gallo M.D.   On: 05/15/2015 15:45    ASSESSMENT AND PLAN  81 yo female with history of severe COPD, pernicious anemia,  prior GI bleeding, HTN, esophageal adenocarcinoma, GERD who is admitted today following an outpatient diagnostic cardiac cath. Severe 3 vessel disease. Not a candidate for CABG per Dr. Roxan Hockey.  1. L humeral fracture s/p ORIF on 05/15/2015  2. preop ischemic workup  - left heart cath 05/14/2015 EF 45-50% 99% prox RCA, 40% dist RCA, 80% ost RPDA, 75% ost left main lesion  - right heart cath 05/14/2015 CI 2.53, CO 4.22, mean wedge 7mmHg, RV 54/7/14  - seen by CT surgery, poor candidate for CABG  - per Dr. Tamala Julian, PCI in the absence of anginal complaints and severity of disease has not been recommended. Will discuss with MD regarding possible medical management. Has not started on plavix given ortho procedure.  3. Chronic diastolic HF 4. HTN 5. GERD  Signed, Almyra Deforest PA-C Pager:  [phone]  Patient seen and examined. Agree with assessment and plan. Medical management as noted above.  No recurrent chest pain. Will emperically increase amlodipine to 5 mg daily.  Consider future plavix therapy and possible ranolazine.   Troy Sine, MD, [facility] 05/16/2015 7:40 AM

## 2015-05-16 NOTE — Op Note (Signed)
[name], [name]:  [id number]  MEDICAL RECORD NO.:  [id number]  LOCATION:  5N10C                        FACILITY:  [place]  PHYSICIAN:  Anderson Malta, M.D.    DATE OF BIRTH:  1934-01-29  DATE OF PROCEDURE: DATE OF DISCHARGE:                              OPERATIVE REPORT   PREOPERATIVE DIAGNOSIS:  Left proximal humerus fracture, displaced.  POSTOPERATIVE DIAGNOSIS:  Left proximal humerus fracture, displaced.  PROCEDURE:  Left proximal humerus fracture open reduction and internal fixation.  SURGEON:  Anderson Malta, M.D.  ASSISTANTLarkin Ina, RNFA.  INDICATIONS:  [name] is a 81 year old patient with left proximal humerus fracture, presents for operative management after explanation of risks and benefits, and failure of initial closed reduction and close management.  IMPLANTS UTILIZED:  Biomet proximal humeral plate.  PROCEDURE IN DETAIL:  The patient was brought to the operating room where general endotracheal anesthesia was induced.  Preoperative antibiotics were administered.  Time-out was called.  Left shoulder, arm and hand were prescribed with alcohol and Betadine, allowed to air dry, prepped with DuraPrep solution and draped in a sterile manner.  [name] was used to cover the operative field.  With the head in neutral position, the patient had a bed taken was about 30 degrees.  Time-out was called.  Deltopectoral approach was made.  Skin and subcutaneous tissues were sharply divided.  Vein was mobilized medially.  Fracture was visualized.  Cobalt retractor placed, round retractor placed. Biceps tendon identified.  Pectoralis tendon partially released. Fracture had voided cancellous bone at the fracture site.  Periosteum was stripped just lateral to the bicipital groove in order to facilitate plate placement.  At this time, the fracture was reduced and the plate was applied first with K-wires.  The reduction was confirmed to be adequate in the AP  and lateral planes under fluoroscopy.  Then, pegs were placed into the head and nonlocking cortex screws x4 placed into the shaft.  Some of the screws had to be changed due to length concerns; however, good adequate fixation was achieved.  Calcium phosphate cement was then placed in the humeral head defect.  At this time, thorough irrigation was performed with 3 liters of irrigating solution. Deltopectoral interval was closed using running #1 Vicryl suture followed by interrupted inverted 0 Vicryl suture, 2-0 Vicryl suture, Monocryl and Aquacel dressing.  The patient was placed in shoulder sling, transferred to the recovery room in stable condition.     Anderson Malta, M.D.     GSD/MEDQ  D:  05/15/2015  T:  05/16/2015  Job:  [id number]

## 2015-05-16 NOTE — Care Management Utilization Note (Signed)
Utilization review completed by Olevia Westervelt N. Lovelace Cerveny, RN BSN 

## 2015-05-16 NOTE — Evaluation (Signed)
Physical Therapy Evaluation Patient Details Name: [name] MRN: [id number] DOB: November 18, 1934 Today's Date: 05/16/2015   History of Present Illness  Pt is a 81 y.o. Female with L proximal humerus fx from fall on 5/21 with closed reduction on 5/22. Pt d/c to SNF and had a fall about a week after surgery with loss of alignment. Pt underwent right/left cardiac cath on 6/8 to clear for surgery. Pt is now s/p L humerus ORIF on 05/15/15. PMH pernicioius anemia, OA, fibromyalgia, HTN, COPD, esophageal adenocarcinoma. Pt also has L thumb proximal phalanx fx from previous fall.   Clinical Impression  Patient is s/p above surgery resulting in functional limitations due to the deficits listed below (see PT Problem List). Pt extremely lethargic this session and has difficulty keeping eyes open thus limiting PT session.  Pt required 1 person HHA for stand pivot transfer.  Patient will benefit from skilled PT to increase their independence and safety with mobility to allow discharge to the venue listed below.      Follow Up Recommendations SNF    Equipment Recommendations  Other (comment) (TBD by next venue of care)    Recommendations for Other Services       Precautions / Restrictions Precautions Precautions: Fall;Shoulder Type of Shoulder Precautions: Passive Protocol Shoulder Interventions: Shoulder sling/immobilizer;At all times;Off for dressing/bathing/exercises Precaution Comments: PROM 0-45 FF, 0-30 ER, No ABD; AROM elbow/wrist/hand. Has L thumb spica.  Required Braces or Orthoses: Sling Restrictions Weight Bearing Restrictions: Yes LUE Weight Bearing: Non weight bearing      Mobility  Bed Mobility Overal bed mobility: Needs Assistance Bed Mobility: Supine to Sit     Supine to sit: Mod assist     General bed mobility comments: Mod assist during supine>sit to support trunk posteriorly.  Cues for hand placement and technique.  Transfers Overall transfer level: Needs  assistance Equipment used: 1 person hand held assist Transfers: Sit to/from Stand Sit to Stand: Min assist Stand pivot transfers: Min assist       General transfer comment: Min assist w/ 1 person HHA.  Pt w/ uncontrolled descent to recliner chair causing severe pain in L shoulder.  Educated pt on proper controlled descent technique and pt verbalized understanding.  Ambulation/Gait             General Gait Details: pivotal steps only  Stairs            Wheelchair Mobility    Modified Rankin (Stroke Patients Only)       Balance Overall balance assessment: Needs assistance;History of Falls Sitting-balance support: Single extremity supported;Feet supported Sitting balance-Leahy Scale: Poor Sitting balance - Comments: Pt able to maintain balance for 1 minute w/o assist sitting EOB   Standing balance support: Single extremity supported;During functional activity Standing balance-Leahy Scale: Fair                               Pertinent Vitals/Pain Pain Assessment: Faces Faces Pain Scale: Hurts whole lot Pain Location: L shoulder Pain Descriptors / Indicators: Grimacing;Guarding;Moaning Pain Intervention(s): Limited activity within patient's tolerance;Monitored during session;Repositioned    Home Living Family/patient expects to be discharged to:: Skilled nursing facility                 Additional Comments: Pt d/c to SNF after closed reduction.     Prior Function Level of Independence: Needs assistance   Gait / Transfers Assistance Needed: Pt reports independence without use of  AD  ADL's / Homemaking Assistance Needed: Pt requires assist for all ADLs.   Comments: Pt reports h/o falls     Hand Dominance   Dominant Hand: Right    Extremity/Trunk Assessment   Upper Extremity Assessment: Defer to OT evaluation           Lower Extremity Assessment: Generalized weakness      Cervical / Trunk Assessment: Kyphotic  Communication    Communication: No difficulties  Cognition Arousal/Alertness: Suspect due to medications;Lethargic (Pt w/ difficulty keeping eyes open during session) Behavior During Therapy: WFL for tasks assessed/performed Overall Cognitive Status: Within Functional Limits for tasks assessed                      General Comments General comments (skin integrity, edema, etc.): Pt extremely lethargic this session and has difficulty keeping eyes open    Exercises        Assessment/Plan    PT Assessment Patient needs continued PT services  PT Diagnosis Acute pain;Generalized weakness;Difficulty walking   PT Problem List Decreased strength;Decreased range of motion;Decreased activity tolerance;Decreased balance;Decreased mobility;Decreased coordination;Decreased cognition;Decreased knowledge of use of DME;Decreased safety awareness;Decreased knowledge of precautions;Cardiopulmonary status limiting activity;Decreased skin integrity;Pain  PT Treatment Interventions DME instruction;Gait training;Functional mobility training;Therapeutic activities;Therapeutic exercise;Balance training;Neuromuscular re-education;Patient/family education;Modalities   PT Goals (Current goals can be found in the Care Plan section) Acute Rehab PT Goals Patient Stated Goal: none stated PT Goal Formulation: With patient Time For Goal Achievement: 05/23/15 Potential to Achieve Goals: Good    Frequency Min 3X/week   Barriers to discharge        Co-evaluation               End of Session Equipment Utilized During Treatment: Oxygen Activity Tolerance: Patient limited by lethargy Patient left: in chair;with call bell/phone within reach;with chair alarm set Nurse Communication: Mobility status;Precautions;Weight bearing status         Time: 5038-8828 PT Time Calculation (min) (ACUTE ONLY): 26 min   Charges:   PT Evaluation $Initial PT Evaluation Tier I: 1 Procedure PT Treatments $Therapeutic Activity:  8-22 mins   PT G Codes:       Joslyn Hy PT, DPT [phone] Pager: [phone] 05/16/2015, 12:31 PM

## 2015-05-16 NOTE — Progress Notes (Signed)
Patient complaining of pain on the left shoulder surgery site. Tylenol given early. Patient allergic to more effective pain medications. Will continue to monitor. Nurse will give flexeril too. Waiting for pharmacy to send.

## 2015-05-17 DIAGNOSIS — I10 Essential (primary) hypertension: Secondary | ICD-10-CM

## 2015-05-17 DIAGNOSIS — I350 Nonrheumatic aortic (valve) stenosis: Secondary | ICD-10-CM

## 2015-05-17 NOTE — Progress Notes (Signed)
Occupational Therapy Treatment Patient Details Name: [name] MRN: [id number] DOB: January 05, 1934 Today's Date: 05/17/2015    History of present illness Pt is a 81 y.o. Female with L proximal humerus fx from fall on 5/21 with closed reduction on 5/22. Pt d/c to SNF and had a fall about a week after surgery with loss of alignment. Pt underwent right/left cardiac cath on 6/8 to clear for surgery. Pt is now s/p L humerus ORIF on 05/15/15. PMH pernicioius anemia, OA, fibromyalgia, HTN, COPD, esophageal adenocarcinoma. Pt also has L thumb proximal phalanx fx from previous fall.    OT comments  Pt seen for therapeutic exercise. She declined OOB due to fatigue and she reports she "didn't sleep at all last night." Pt was irritable but did tolerate therapeutic ROM. Acute OT to continue with POC.    Follow Up Recommendations  SNF;Supervision/Assistance - 24 hour    Equipment Recommendations  None recommended by OT    Recommendations for Other Services      Precautions / Restrictions Precautions Precautions: Fall;Shoulder Type of Shoulder Precautions: Passive Protocol Shoulder Interventions: Shoulder sling/immobilizer;At all times;Off for dressing/bathing/exercises Precaution Booklet Issued: Yes (comment) Precaution Comments: PROM 0-45 FF, 0-30 ER, No ABD; AROM elbow/wrist/hand. Has L thumb spica.  Required Braces or Orthoses: Sling Restrictions Weight Bearing Restrictions: Yes LUE Weight Bearing: Non weight bearing       Mobility Bed Mobility Overal bed mobility: Needs Assistance Bed Mobility: Rolling Rolling: Min assist         General bed mobility comments: Min A to roll for repositioniong.   Transfers                 General transfer comment: Pt declined OOB        ADL Overall ADL's : Needs assistance/impaired                                       General ADL Comments: Pt reports that she did not sleep last night and refused getting up but  tolerated therapeutic ROM.                 Cognition  Arousal/Alertness: Awake/Alert Behavior During Therapy: WFL for tasks assessed/performed (irritable) Overall Cognitive Status: Within Functional Limits for tasks assessed                         Exercises Shoulder Exercises Shoulder Flexion: PROM;Left;10 reps;Supine Shoulder External Rotation: PROM;Left;10 reps;Supine Elbow Flexion: AROM;Left;10 reps;Supine Elbow Extension: AROM;Left;10 reps;Supine Wrist Flexion: AROM;Left;10 reps;Supine Wrist Extension: AROM;Left;10 reps;Supine Digit Composite Flexion: AROM;Left;10 reps;Supine           Pertinent Vitals/ Pain       Pain Assessment: Faces Faces Pain Scale: Hurts little more Pain Location: L shoulder Pain Descriptors / Indicators: Aching Pain Intervention(s): Limited activity within patient's tolerance;Monitored during session;Repositioned         Frequency Min 2X/week     Progress Toward Goals  OT Goals(current goals can now be found in the care plan section)  Progress towards OT goals: Progressing toward goals     Plan Discharge plan remains appropriate       End of Session Equipment Utilized During Treatment: Other (comment) (sling)   Activity Tolerance Patient limited by fatigue;Patient limited by pain   Patient Left in bed;with call bell/phone within reach;with bed alarm set   Nurse Communication  Time: 9292-4462 OT Time Calculation (min): 17 min  Charges: OT General Charges $OT Visit: 1 Procedure OT Treatments $Therapeutic Exercise: 8-22 mins  Juluis Rainier 05/17/2015, 10:00 AM  Cyndie Chime, OTR/L Occupational Therapist [phone] (pager)

## 2015-05-17 NOTE — Care Management (Signed)
Medicare Important Message given? YES  Date Medicare IM given: 05/17/15 Medicare IM given by: Venita Sheffield RN CCM

## 2015-05-17 NOTE — Consult Note (Signed)
     Subjective:  POD #1 Left Humorous ORIF. Stable cardiac. Med Rx for LM/3VD  Objective:  Temp:  [98 F (36.7 C)-100 F (37.8 C)] 98 F (36.7 C) (06/11 0629) Pulse Rate:  [77-92] 85 (06/11 0629) Resp:  [18] 18 (06/10 1300) BP: (96-133)/(50-72) 125/50 mmHg (06/11 0629) SpO2:  [91 %-94 %] 94 % (06/11 0958) Weight change:   Intake/Output from previous day: 06/10 0701 - 06/11 0700 In: 572.5 [P.O.:220; I.V.:352.5] Out: 100 [Urine:100]  Intake/Output from this shift: Total I/O In: 240 [P.O.:240] Out: -   Physical Exam: General appearance: alert and no distress Neck: no adenopathy, no carotid bruit, no JVD, supple, symmetrical, trachea midline and thyroid not enlarged, symmetric, no tenderness/mass/nodules Lungs: clear to auscultation bilaterally Heart: sofy outflow tract murmur Extremities: extremities normal, atraumatic, no cyanosis or edema  Lab Results: No results found for this or any previous visit (from the past 48 hour(s)).  Imaging: Imaging results have been reviewed  Assessment/Plan:   1. Active Problems: 2.   CAD (coronary artery disease), native coronary artery 3.   Coronary artery disease involving native coronary artery of native heart without angina pectoris 4.   Shoulder fracture, left 5.   Time Spent Directly with Patient:  15 minutes  Length of Stay:  LOS: 3 days   POD # 1 LUE ORIF by Dr. Marlou Sa for non displaced Left humoral Fx. Had cath which showed LM/3 VD however pt is asymptomatic. Not a CABG candidate. Plan was med Rx and she tolerated her ortho procedure w/o difficulty. On BB, ASA, statin and ACE-I. She also has moderate LV dysfunction (EF 40%) with moderate AS. She denies CP/SOB at home. Plan is to Tx back to rehab facility. Nothing further to add. Will need OP follow up with [place].  Quay Burow 05/17/2015, 11:32 AM

## 2015-05-17 NOTE — Progress Notes (Signed)
ANTIBIOTIC CONSULT NOTE - Follow-up  Pharmacy Consult:  Diflucan Indication:  Empiric  Allergies  Allergen Reactions  . Keflex [Cephalexin] Itching    "severe itching"  . Nulecit [Na Ferric Gluc Cplx In Sucrose] Diarrhea, Nausea Only and Other (See Comments)      drop in BP  . Biaxin [Clarithromycin] Itching  . Fentanyl Itching  . Spiriva Handihaler [Tiotropium Bromide Monohydrate] Other (See Comments)    constipation  . Tramadol Itching    Skin burning and itching  . Adhesive [Tape] Itching  . Clarithromycin Itching  . Darvon Itching  . Epinephrine Itching  . Morphine And Related Itching  . Oxycodone Itching  . Sulfa Antibiotics Itching  . Vicodin [Hydrocodone-Acetaminophen] Itching    Can take with Benadryl    Patient Measurements: Height: 5\' 3"  (160 cm) Weight: 136 lb 11 oz (62 kg) IBW/kg (Calculated) : 52.4  Vital Signs: Temp: 98 F (36.7 C) (06/11 0629) BP: 125/50 mmHg (06/11 0629) Pulse Rate: 85 (06/11 0629) Intake/Output from previous day: 06/10 0701 - 06/11 0700 In: 572.5 [P.O.:220; I.V.:352.5] Out: 100 [Urine:100] Intake/Output from this shift: Total I/O In: 240 [P.O.:240] Out: -   Labs:  Recent Labs  05/15/15 0249  WBC 8.8  HGB 9.6*  PLT 465*  CREATININE 0.84   Estimated Creatinine Clearance: 45.7 mL/min (by C-G formula based on Cr of 0.84). No results for input(s): VANCOTROUGH, VANCOPEAK, VANCORANDOM, GENTTROUGH, GENTPEAK, GENTRANDOM, TOBRATROUGH, TOBRAPEAK, TOBRARND, AMIKACINPEAK, AMIKACINTROU, AMIKACIN in the last 72 hours.   Microbiology: Recent Results (from the past 720 hour(s))  Surgical pcr screen     Status: Abnormal   Collection Time: 04/26/15  5:14 PM  Result Value Ref Range Status   MRSA, PCR NEGATIVE NEGATIVE Final   Staphylococcus aureus POSITIVE (A) NEGATIVE Final    Comment:        The Xpert SA Assay (FDA approved for NASAL specimens in patients over 79 years of age), is one component of a comprehensive  surveillance program.  Test performance has been validated by [facility] for patients greater than or equal to 79 year old. It is not intended to diagnose infection nor to guide or monitor treatment.   Surgical pcr screen     Status: Abnormal   Collection Time: 05/15/15  8:10 AM  Result Value Ref Range Status   MRSA, PCR NEGATIVE NEGATIVE Final   Staphylococcus aureus POSITIVE (A) NEGATIVE Final    Comment:        The Xpert SA Assay (FDA approved for NASAL specimens in patients over 79 years of age), is one component of a comprehensive surveillance program.  Test performance has been validated by [facility] for patients greater than or equal to 79 year old. It is not intended to diagnose infection nor to guide or monitor treatment.     Medical History: Past Medical History  Diagnosis Date  . Fibromyalgia   . Skin lesion     chronic calcific cutaneous lesions  . HX of multiple bleeding ulcers 09/01/2011  . Hypertension     takes Atenolol daily  . Emphysema   . Dizziness     d/t crystals in ears that "roll around"and can't get them out  . Osteoporosis   . Scoliosis   . Psoriasis   . H/O hiatal hernia   . GERD (gastroesophageal reflux disease)     takes Aciphex daily  . Gastric ulcer   . Constipation     since Chemo and Radiation;finished up in Nov 2012  .  Diverticulosis   . History of colonic polyps   . Shingles     broke out 2wks ago;has been on meds and to finish up tomorrow  . Vitamin B deficiency   . COPD (chronic obstructive pulmonary disease)   . On home oxygen therapy     "2L;  24/7" (05/14/2015)  . Left humeral fracture 04/26/2015  . Scoliosis   . Complication of anesthesia     shortness of breath and pain with gas   . CHF (congestive heart failure)   . Exertional shortness of breath   . Pneumonia 2015 X 3  . Chronic bronchitis     "I about keep it"  . History of blood transfusion     "related to my anemia"  . Osteoarthritis   . Arthritis      "qwhere" (05/14/2015)  . Chronic upper back pain   . Kidney stone 25+yrs ago    "passed them"  . Adenocarcinoma of gastroesophageal junction   . Pernicious anemia   . Iron deficiency anemia   . Anemia, B12 deficiency     "get shots once/month" (05/14/2015)      Assessment: 81 YOF s/p ORIF proximal humerus fracture on 05/15/15 to start diflucan as empiric therapy.  Patient is on chronic steroid.  Baseline labs reviewed.  Fluc 6/9 >>  6/9 SA PCR - positive  Patient reported a yeast infection prior to ortho surgery  Goal of Therapy:  Infection prevention / Resolution of infection   Plan:  - Diflucan 200mg  po Q24H x 3 doses -Pharmacy will sign-off  Levester Fresh, PharmD, BCPS Clinical Pharmacist Pager [phone] 05/17/2015 1:41 PM

## 2015-05-17 NOTE — Progress Notes (Signed)
05/17/2015 12:17 AM  CCMD called about pt in SVT with a rate of 173. Pt has no c/o heart pain, but stated she could feel her heart racing. She said this happens often, and that she can feel her heart when it slows down. Pt in no apparent distress. VS include; Temp 100F, BP- 133/72, HR- 92, O2-  93% on 2L O2. Pt had a heart cath on 6-8. Cardiology called and advised me to just continue monitoring pt.   05/17/2015  12:21 AM  Pt HR now back in NSR at 94 HR. Will continue to monitor.

## 2015-05-18 LAB — TYPE AND SCREEN
ABO/RH(D): O NEG
Antibody Screen: NEGATIVE
Unit division: 0
Unit division: 0
Unit division: 0

## 2015-05-18 MED ORDER — IBUPROFEN 200 MG PO TABS
200.0000 mg | ORAL_TABLET | Freq: Two times a day (BID) | ORAL | Status: DC
  Administered 2015-05-18 – 2015-05-20 (×6): 200 mg via ORAL
  Filled 2015-05-18 (×6): qty 1

## 2015-05-18 NOTE — Progress Notes (Signed)
Central telemetry called and notified that the patient had a run of atrial flutter with heart rate of 150. Patient noted resting in bed with eyes open. No signs/symptoms of distress. States she "feels fine". The attending Dr. Theodosia Blender office was made aware. Will continue to monitor closely.

## 2015-05-18 NOTE — Progress Notes (Signed)
Pt stable - doing ok with OT Dressing ok left shoulder Plan dc this week - pt wants to try motrin for pain Change aquacell dressing am to mepilex before discharge

## 2015-05-19 ENCOUNTER — Inpatient Hospital Stay (HOSPITAL_COMMUNITY): Payer: Medicare Other

## 2015-05-19 DIAGNOSIS — J449 Chronic obstructive pulmonary disease, unspecified: Secondary | ICD-10-CM

## 2015-05-19 DIAGNOSIS — I519 Heart disease, unspecified: Secondary | ICD-10-CM

## 2015-05-19 LAB — BASIC METABOLIC PANEL
Anion gap: 10 (ref 5–15)
BUN: 20 mg/dL (ref 6–20)
CHLORIDE: 98 mmol/L — AB (ref 101–111)
CO2: 25 mmol/L (ref 22–32)
CREATININE: 1.32 mg/dL — AB (ref 0.44–1.00)
Calcium: 9.3 mg/dL (ref 8.9–10.3)
GFR calc Af Amer: 44 mL/min — ABNORMAL LOW (ref >60)
GFR calc non Af Amer: 38 mL/min — ABNORMAL LOW (ref >60)
Glucose, Bld: 111 mg/dL — ABNORMAL HIGH (ref 65–99)
POTASSIUM: 5.1 mmol/L (ref 3.5–5.1)
Sodium: 133 mmol/L — ABNORMAL LOW (ref 135–145)

## 2015-05-19 LAB — CBC
HCT: 31.5 % — ABNORMAL LOW (ref 36.0–46.0)
Hemoglobin: 9.6 g/dL — ABNORMAL LOW (ref 12.0–15.0)
MCH: 25.9 pg — ABNORMAL LOW (ref 26.0–34.0)
MCHC: 30.5 g/dL (ref 30.0–36.0)
MCV: 85.1 fL (ref 78.0–100.0)
Platelets: 448 10*3/uL — ABNORMAL HIGH (ref 150–400)
RBC: 3.7 MIL/uL — ABNORMAL LOW (ref 3.87–5.11)
RDW: 16.1 % — AB (ref 11.5–15.5)
WBC: 11.3 10*3/uL — AB (ref 4.0–10.5)

## 2015-05-19 MED ORDER — FUROSEMIDE 20 MG PO TABS: 20.0000 mg | ORAL_TABLET | Freq: Once | ORAL | Status: DC

## 2015-05-19 NOTE — Progress Notes (Signed)
Occupational Therapy Treatment Patient Details Name: [name] MRN: [id number] DOB: 04/25/1934 Today's Date: 05/19/2015    History of present illness Pt is a 81 y.o. Female with L proximal humerus fx from fall on 5/21 with closed reduction on 5/22. Pt d/c to SNF and had a fall about a week after surgery with loss of alignment. Pt underwent right/left cardiac cath on 6/8 to clear for surgery. Pt is now s/p L humerus ORIF on 05/15/15. PMH pernicioius anemia, OA, fibromyalgia, HTN, COPD, esophageal adenocarcinoma. Pt also has L thumb proximal phalanx fx from previous fall.    OT comments  This 81 yo female admitted for above presents to acute OT today with tolerating PROM to shoulder; however does not want to get up right now due to she reports that she has not slept well for the last 3 nights and just wants to rest. Said she is agreeable to get up later--I have asked PT to see her later today to allow her to rest for now. Of note, thumb spica splint was not on her that had been placed last admission (she reports it has not been on since she had surgery on her shoulder. I found it in her room and put it on for her and made RN Andee Poles) aware. (no order in chart for it since it was placed last admission)  Follow Up Recommendations  SNF;Supervision/Assistance - 24 hour    Equipment Recommendations  None recommended by OT       Precautions / Restrictions Precautions Precautions: Fall;Shoulder Type of Shoulder Precautions: Passive Protocol Shoulder Interventions: Shoulder sling/immobilizer;Off for dressing/bathing/exercises Precaution Comments: PROM 0-45 FF, 0-30 ER, No ABD; AROM elbow/wrist/hand. Has L thumb spica.  Required Braces or Orthoses: Sling Restrictions Weight Bearing Restrictions: Yes LUE Weight Bearing: Non weight bearing                    Vision                 Additional Comments: No change from baseline          Cognition   Behavior During Therapy:  WFL for tasks assessed/performed Overall Cognitive Status: Within Functional Limits for tasks assessed                         Exercises Other Exercises Other Exercises: Pt performed 10 reps of PROM for shoulder flexion (30 degrees) and shoulder external rotation (-20 degrees from neutral). AROM elbow flexion/extension--all bed level.           Pertinent Vitals/ Pain       Pain Assessment: No/denies pain         Frequency  (5x week for arm, 2x week for up OOB)     Progress Toward Goals  OT Goals(current goals can now be found in the care plan section)  Progress towards OT goals: Progressing toward goals     Plan Discharge plan remains appropriate       End of Session     Activity Tolerance Patient limited by lethargy   Patient Left in bed;with call bell/phone within reach           Time: 9528-4132 OT Time Calculation (min): 16 min  Charges: OT General Charges $OT Visit: 1 Procedure OT Treatments $Therapeutic Exercise: 8-22 mins  Almon Register [phone] 05/19/2015, 9:00 AM

## 2015-05-19 NOTE — Care Management Note (Signed)
Case Management Note  Patient Details  Name: [name] MRN: [id number] Date of Birth: [birth year]  Subjective/Objective:                 S/p ORIF of left proximal humerus fracture   Action/Plan:    Plan to return to [facility]. Referral made to CSW.  Expected Discharge Date:                  Expected Discharge Plan:  Skilled Nursing Facility  In-House Referral:  Clinical Social Work  Discharge planning Services  CM Consult  Post Acute Care Choice:  NA Choice offered to:  NA  DME Arranged:    DME Agency:     HH Arranged:    [place] Agency:     Status of Service:  Completed, signed off  Medicare Important Message Given:  Yes Date Medicare IM Given:  05/19/15 Medicare IM give by:  Fuller Plan RN, BSN, CCM Date Additional Medicare IM Given:    Additional Medicare Important Message give by:     If discussed at [place] of Stay Meetings, dates discussed:    Additional Comments:  Nila Nephew, RN 05/19/2015, 10:33 AM

## 2015-05-19 NOTE — Progress Notes (Signed)
Physical Therapy Treatment Patient Details Name: [name] MRN: [id number] DOB: [birth year] Today's Date: 05/19/2015    History of Present Illness Pt is a 81 y.o. Female with L proximal humerus fx from fall on 5/21 with closed reduction on 5/22. Pt d/c to SNF and had a fall about a week after surgery with loss of alignment. Pt underwent right/left cardiac cath on 6/8 to clear for surgery. Pt is now s/p L humerus ORIF on 05/15/15. PMH pernicioius anemia, OA, fibromyalgia, HTN, COPD, esophageal adenocarcinoma. Pt also has L thumb proximal phalanx fx from previous fall.     PT Comments    Pt ambulated 20 ft in room w/ 1 person HHA and politely refused to ambulate farther out of room.  Pt completed therapeutic exercises sitting in recliner chair.  Pt will benefit from continued skilled PT services to increase functional independence and safety.  Follow Up Recommendations  SNF     Equipment Recommendations  Other (comment) (TBD by next venue of care; likely benefit from [facility])    Recommendations for Other Services       Precautions / Restrictions Precautions Precautions: Fall;Shoulder Type of Shoulder Precautions: Passive Protocol Shoulder Interventions: Shoulder sling/immobilizer;Off for dressing/bathing/exercises Precaution Comments: PROM 0-45 FF, 0-30 ER, No ABD; AROM elbow/wrist/hand. Has L thumb spica.  Required Braces or Orthoses: Sling Restrictions Weight Bearing Restrictions: Yes LUE Weight Bearing: Non weight bearing    Mobility  Bed Mobility Overal bed mobility: Modified Independent Bed Mobility: Rolling;Sidelying to Sit Rolling: Modified independent (Device/Increase time) Sidelying to sit: Modified independent (Device/Increase time)       General bed mobility comments: Use of bed rail and increased time  Transfers Overall transfer level: Needs assistance Equipment used: 1 person hand held assist Transfers: Sit to/from Stand Sit to Stand: Min assist          General transfer comment: 1 person hand held assist to maintain balance  Ambulation/Gait Ambulation/Gait assistance: Min assist Ambulation Distance (Feet): 20 Feet Assistive device: 1 person hand held assist Gait Pattern/deviations: Antalgic;Trunk flexed;Decreased stride length   Gait velocity interpretation: Below normal speed for age/gender General Gait Details: HHA to maintain balance.  Pt politely refused to ambulate farther and requested to sit in recliner chair.   Stairs            Wheelchair Mobility    Modified Rankin (Stroke Patients Only)       Balance Overall balance assessment: Needs assistance Sitting-balance support: Single extremity supported;Feet supported Sitting balance-Leahy Scale: Fair     Standing balance support: Single extremity supported;During functional activity Standing balance-Leahy Scale: Fair                      Cognition Arousal/Alertness: Awake/alert Behavior During Therapy: WFL for tasks assessed/performed Overall Cognitive Status: Within Functional Limits for tasks assessed                      Exercises General Exercises - Lower Extremity Ankle Circles/Pumps: AROM;Both;10 reps;Supine;Seated Long Arc Quad: AROM;Both;10 reps;Seated    General Comments General comments (skin integrity, edema, etc.): Pt w/ calcium deposits in B quads.  Pt w/ pain performing RLE long arc quad.      Pertinent Vitals/Pain Pain Assessment: 0-10 Pain Score: 8  Pain Location: L shoulder and hand Pain Descriptors / Indicators: Aching;Constant Pain Intervention(s): Limited activity within patient's tolerance;Monitored during session;Repositioned    Home Living  Prior Function            PT Goals (current goals can now be found in the care plan section) Acute Rehab PT Goals Patient Stated Goal: to walk PT Goal Formulation: With patient Time For Goal Achievement: 05/23/15 Potential to Achieve  Goals: Good Progress towards PT goals: Progressing toward goals    Frequency  Min 3X/week    PT Plan Current plan remains appropriate    Co-evaluation             End of Session Equipment Utilized During Treatment: Oxygen Activity Tolerance: Patient limited by fatigue Patient left: in chair;with call bell/phone within reach;with chair alarm set     Time: 4431-5400 PT Time Calculation (min) (ACUTE ONLY): 20 min  Charges:  $Therapeutic Exercise: 8-22 mins                    G Codes:      Joslyn Hy PT, DPT [phone] Pager: [phone] 05/19/2015, 1:34 PM

## 2015-05-19 NOTE — Progress Notes (Signed)
Patient Name: [name] Date of Encounter: 05/19/2015  Primary Cardiologist: Dr. Radford Pax   Active Problems:   CAD (coronary artery disease), native coronary artery   Coronary artery disease involving native coronary artery of native heart without angina pectoris   Shoulder fracture, left    SUBJECTIVE  Denies ever having CP, mild SOB. Did not sleep well for the past 3 days.   CURRENT MEDS . sodium chloride   Intravenous Once  . sodium chloride  10 mL/hr Intravenous Once  . amLODipine  2.5 mg Oral q morning - 10a  . aspirin EC  81 mg Oral q morning - 10a  . atenolol  50 mg Oral BID  . atorvastatin  80 mg Oral q1800  . Fluticasone Furoate-Vilanterol  2 puff Inhalation q morning - 10a  . furosemide  40 mg Oral q morning - 10a  . guaiFENesin  600 mg Oral BID  . ibuprofen  200 mg Oral BID  . ipratropium-albuterol  3 mL Nebulization Q6H  . lisinopril  40 mg Oral QPM  . pantoprazole  40 mg Oral Daily  . potassium chloride SA  20 mEq Oral Daily  . predniSONE  2 mg Oral Q breakfast  . senna  1 tablet Oral QHS  . sodium chloride  3 mL Intravenous Q12H    OBJECTIVE  Filed Vitals:   05/18/15 2113 05/19/15 0238 05/19/15 0608 05/19/15 0749  BP:   125/60   Pulse: 93 66 87   Temp:   98.2 F (36.8 C)   TempSrc:   Oral   Resp: 20 20 18    Height:      Weight:      SpO2: 95% 95% 92% 94%    Intake/Output Summary (Last 24 hours) at 05/19/15 1509 Last data filed at 05/18/15 1653  Gross per 24 hour  Intake    240 ml  Output    300 ml  Net    -60 ml   Filed Weights   05/14/15 0847 05/15/15 0000  Weight: 140 lb (63.504 kg) 136 lb 11 oz (62 kg)    PHYSICAL EXAM  General: Pleasant, NAD. Neuro: Alert and oriented X 3.  Psych: Normal affect. HEENT:  Normal  Neck: Supple without bruits or JVD. Lungs:  Resp regular and unlabored. Decreased breath sound with bibasilar rale.  Heart: RRR no s3, s4, or murmurs. Abdomen: Soft, non-tender, non-distended, BS + x 4.    Extremities: No clubbing, cyanosis or edema. DP/PT/Radials 2+ and equal bilaterally. L arm in sling.   Accessory Clinical Findings  TELE NSR with 2 short bursts of SVT    ECG  No new EKG  Echocardiogram 05/12/2015  LV EF: 40% -  45%  ------------------------------------------------------------------- Indications:   Palpitations 785.1.  ------------------------------------------------------------------- History:  PMH:  Murmur. Chronic obstructive pulmonary disease. Risk factors: Current tobacco use. Hypertension.  ------------------------------------------------------------------- Study Conclusions  - Left ventricle: The cavity size was normal. Wall thickness was normal. Systolic function was mildly to moderately reduced. The estimated ejection fraction was in the range of 40% to 45%. Mild diffuse hypokinesis with no identifiable regional variations. Features are consistent with a pseudonormal left ventricular filling pattern, with concomitant abnormal relaxation and increased filling pressure (grade 2 diastolic dysfunction). - Aortic valve: There was moderate stenosis. There was trivial regurgitation. - Mitral valve: Moderately to severely fibrotic annulus. Mildly thickened leaflets . There was mild regurgitation directed centrally. Valve area by continuity equation (using LVOT flow): 1.67 cm^2. - Left atrium: The atrium  was moderately dilated. - Pulmonary arteries: Systolic pressure was mildly increased. PA peak pressure: 46 mm Hg (S).    Radiology/Studies  Dg Chest 1 View  04/26/2015   CLINICAL DATA:  Patient slipped and fell in the bathroom at home, injuring the left shoulder. Initial encounter.  EXAM: CHEST  1 VIEW  COMPARISON:  01/08/2015 and earlier.  FINDINGS: Cardiac silhouette upper normal in size, unchanged. Thoracic aorta atherosclerotic, unchanged. Right perihilar oval-shaped opacity, shown on prior CT to represent focal  dilation of the intralobar pulmonary artery. Hilar and mediastinal contours otherwise unremarkable. Mildly prominent bronchovascular markings diffusely, unchanged. Minimal linear scarring in the left lung base, unchanged. No new pulmonary parenchymal abnormalities. No visible pleural effusions.  IMPRESSION: No acute cardiopulmonary disease.   Electronically Signed   By: Evangeline Dakin M.D.   On: 04/26/2015 12:56   Dg Chest 2 View  05/09/2015   CLINICAL DATA:  Acute on chronic CHF; abnormal chest exam, shortness of breath, history of COPD.  EXAM: CHEST  2 VIEW  COMPARISON:  Portable chest x-ray of Apr 29, 2015  FINDINGS: The lungs are mildly hyperinflated with hemidiaphragm flattening. There is subsegmental atelectasis in the lingula. Small amounts of pleural fluid blunt the costophrenic angles posteriorly. The cardiac silhouette is enlarged. The pulmonary vascularity is normal. There is prominent thoracic kyphosis. The patient has undergone kyphoplasty of 3 mid to lower thoracic levels.  IMPRESSION: COPD with tiny posterior pleural effusions. Stable mild cardiomegaly. Marked improvement in the appearance of the chest is consistent with resolution of pulmonary edema.   Electronically Signed   By: David  Martinique M.D.   On: 05/09/2015 07:50   Ct Shoulder Left Wo Contrast  04/26/2015   CLINICAL DATA:  Displaced left proximal humeral fracture.  EXAM: CT OF THE LEFT SHOULDER WITHOUT CONTRAST  TECHNIQUE: Multidetector CT imaging was performed according to the standard protocol. Multiplanar CT image reconstructions were also generated.  COMPARISON:  04/26/2015.  FINDINGS: Highly displaced surgical neck fracture of the left proximal humerus with 1.2 cm overlap, 3.1 cm anterior displacement of the distal fracture fragment, and 2.6 cm medial displacement of the distal fracture fragment. Accordingly, the metaphysis and shaft are significantly anteromedial to the humeral head, in the fractured surfaces are not in direct  contact. The humeral head continues to articulate with the glenoid in the expected fashion.  There is a fracture the greater tuberosity but this is nondisplaced. No lesser tuberosity fracture fragment. I do not observe a scapular fracture or an adjacent rib fracture. T5 compression fracture may well be chronic. T6, T8, and T9 thoracic compression fractures with vertebral augmentations.  Atherosclerotic aortic arch.  IMPRESSION: 1. Two part surgical neck fracture left proximal humerus with the metaphysis/shaft fragment displaced 3.1 cm anterior and 2.6 cm medially with respect to the femoral head, which continues to articulate with the glenoid. There is 1.2 cm of overlap and the fractured surfaces are not contiguous. Please note that although this is only at 2 part fracture, the current degree of displacement is striking. 2. There is a nondisplaced fracture the greater tuberosity. Because this is nondisplaced, it does not make this fracture a 3 part fracture. 3. T5 compression fracture, probably chronic. Old T6, T8, and T9 thoracic compression fractures with vertebral augmentations.   Electronically Signed   By: Van Clines M.D.   On: 04/26/2015 16:32   Dg Chest Port 1 View  04/29/2015   CLINICAL DATA:  Patient with recent diagnosis of pneumonia.  EXAM: PORTABLE CHEST -  1 VIEW  COMPARISON:  Chest radiograph 04/26/2015; 01/08/2015  FINDINGS: Enlarged cardiac and mediastinal contours. Bilateral predominately perihilar interstitial pulmonary opacities. Layering bilateral pleural effusions and underlying pulmonary consolidation. Re- demonstrated comminuted fracture through the proximal left humerus.  IMPRESSION: Cardiomegaly with interstitial pulmonary opacities favored to represent edema.  Layering bilateral pleural effusions and underlying pulmonary consolidation likely representing atelectasis.   Electronically Signed   By: Lovey Newcomer M.D.   On: 04/29/2015 11:41   Dg Shoulder Left  05/15/2015   CLINICAL  DATA:  Fracture fixation  EXAM: DG C-ARM 61-120 MIN; LEFT SHOULDER - 2+ VIEW  COMPARISON:  none  FINDINGS: ORIF with metal plate and multiple screws in the left humerus and left proximal humeral shaft in satisfactory position and alignment. Fracture line not identified on the studies. There appears to be bone cement overlying the lateral humeral head.  IMPRESSION: Satisfactory fracture fixation.   Electronically Signed   By: Franchot Gallo M.D.   On: 05/15/2015 15:45   Dg Shoulder Left  04/27/2015   CLINICAL DATA:  Closed manipulation of left humerus dislocation.  EXAM: LEFT SHOULDER - 2+ VIEW; DG C-ARM 1-60 MIN - NRPT [facility]  COMPARISON:  Preoperative radiographs and CT.  FINDINGS: Two fluoroscopic spot images of the left shoulder during closed manipulation demonstrates improved alignment of the proximal humerus fracture. Fluoroscopy time reported 6 seconds.  IMPRESSION: Intraoperative fluoroscopy during close manipulation of left proximal humerus fracture, the fracture is in improved alignment.   Electronically Signed   By: Jeb Levering M.D.   On: 04/27/2015 03:29   Dg Shoulder Left  04/26/2015   CLINICAL DATA:  Patient status post fall with left shoulder pain. History of cancer.  EXAM: LEFT SHOULDER - 2+ VIEW  COMPARISON:  Chest radiograph 04/26/2015; 07/25/2014  FINDINGS: There is a markedly comminuted fracture of the proximal left humerus involving the left humeral head. The humeral head appears to be located on limited views however the proximal left humeral diaphysis appears to be medially dislocated. There is contour abnormality of the mid aspect of the left clavicle which is likely secondary to old healed clavicle fracture. Kyphoplasty material within the visualized thoracic spine. Multiple soft tissue calcifications.  IMPRESSION: Comminuted proximal left humerus fracture. The proximal humeral diaphysis appears to be dislocated medially to the humeral head.  Likely chronic deformity of the mid body  of the left clavicle, potentially secondary to old healed fracture. Recommend correlation with dedicated clavicle views.   Electronically Signed   By: Lovey Newcomer M.D.   On: 04/26/2015 12:53   Dg Shoulder Left Port  05/15/2015   CLINICAL DATA:  81 year old female with a history of ORIF left shoulder  EXAM: LEFT SHOULDER - 1 VIEW  COMPARISON:  Intraoperative images 6 08/2015, plain film 04/27/2015  FINDINGS: Compared to the plain film 04/27/2015 there have been interval changes of open reduction internal fixation of left humeral fracture. Anatomic alignment is maintained. Glenohumeral joint appears congruent.  Multiple clips at the mediastinum, with calcifications of the aortic arch again noted.  Changes of prior vertebroplasty.  IMPRESSION: Interval changes of open reduction internal fixation of left humerus fracture, with anatomic alignment maintained.  Signed,  Dulcy Fanny. Earleen Newport, DO  [facility]  [facility]   Electronically Signed   By: Corrie Mckusick D.O.   On: 05/15/2015 16:18   Dg Shoulder Left Port  04/27/2015   CLINICAL DATA:  Proximal left humerus fracture postreduction.  EXAM: LEFT SHOULDER - 1 VIEW  COMPARISON:  Pre reduction views 1 day prior.  FINDINGS: Improved alignment of the comminuted displaced proximal humerus fracture with persistent but improved displacement of the humeral diaphysis. Glenohumeral alignment is suboptimally assessed, however humeral head is likely normally located. Soft tissue calcifications are again seen.  IMPRESSION: Improved alignment of comminuted displaced proximal humerus fracture. The glenohumeral alignment is suboptimally assessed, however humeral head is likely normally located.   Electronically Signed   By: Jeb Levering M.D.   On: 04/27/2015 02:18   Dg Hand Complete Left  04/26/2015   CLINICAL DATA:  Left hand pain after fall today. History of arthritis.  EXAM: LEFT HAND - COMPLETE 3+ VIEW  COMPARISON:  None.   FINDINGS: Advanced degenerative changes in the first carpometacarpal joint and IP joints diffusely. Joint space narrowing in the in the CP joints. There is a fracture through the proximal phalanx of the left thumb with mild angulation. No additional fracture noted.  IMPRESSION: Fracture through the base of the left thumb proximal phalanx with mild angulation.   Electronically Signed   By: Rolm Baptise M.D.   On: 04/26/2015 18:19   Dg C-arm 1-60 Min-no Report  04/27/2015   CLINICAL DATA:  Closed manipulation of left humerus dislocation.  EXAM: LEFT SHOULDER - 2+ VIEW; DG C-ARM 1-60 MIN - NRPT [facility]  COMPARISON:  Preoperative radiographs and CT.  FINDINGS: Two fluoroscopic spot images of the left shoulder during closed manipulation demonstrates improved alignment of the proximal humerus fracture. Fluoroscopy time reported 6 seconds.  IMPRESSION: Intraoperative fluoroscopy during close manipulation of left proximal humerus fracture, the fracture is in improved alignment.   Electronically Signed   By: Jeb Levering M.D.   On: 04/27/2015 03:29   Dg C-arm 61-120 Min  05/15/2015   CLINICAL DATA:  Fracture fixation  EXAM: DG C-ARM 61-120 MIN; LEFT SHOULDER - 2+ VIEW  COMPARISON:  none  FINDINGS: ORIF with metal plate and multiple screws in the left humerus and left proximal humeral shaft in satisfactory position and alignment. Fracture line not identified on the studies. There appears to be bone cement overlying the lateral humeral head.  IMPRESSION: Satisfactory fracture fixation.   Electronically Signed   By: Franchot Gallo M.D.   On: 05/15/2015 15:45    ASSESSMENT AND PLAN  81 yo female with history of severe COPD, pernicious anemia, prior GI bleeding, HTN, esophageal adenocarcinoma, GERD who is admitted today following an outpatient diagnostic cardiac cath. Severe 3 vessel disease. Not a candidate for CABG per Dr. Roxan Hockey.  1. L humeral fracture s/p ORIF on 05/15/2015  - well healed.  2. preop  ischemic workup - left heart cath 05/14/2015 EF 45-50% 99% prox RCA, 40% dist RCA, 80% ost RPDA, 75% ost left main lesion - right heart cath 05/14/2015 CI 2.53, CO 4.22, mean wedge 28mmHg, RV 54/7/14 - seen by CT surgery, poor candidate for CABG - per Dr. Tamala Julian, PCI in the absence of anginal complaints and severity of disease has not been recommended. Will discuss with MD regarding possible medical management. Has not started on plavix given ortho procedure.  3. Possible acute on chronic diastolic HF  - originally planning for discharge today, however significant cough heard across floor. Exam shows decreased breath sound in bilateral bases with basilar rale, on lasix, per pt, coughing started 6 wks ago, however given physical exam finding, concerns of acute CHF vs PNA (less likely given afebrile).   4. HTN 5. GERD  6. Episodic SVT on telemetry: continue atenolol  Hilbert Corrigan PA-C Pager: [phone]  Agree with note by Almyra Deforest PA-C  CV stable. No CP. Chronic SOB. CXR ordered for cough. Only complaint is left shoulder pain. Will Rx with Motrin. Once Ortho clears her for DC will arrange Tx to rehab facility.   Lorretta Harp, M.D., [place], [facility], [place] [address] [place], [place]  [id number]  [phone] 05/19/2015 5:00 PM

## 2015-05-20 DIAGNOSIS — Z419 Encounter for procedure for purposes other than remedying health state, unspecified: Secondary | ICD-10-CM | POA: Insufficient documentation

## 2015-05-20 LAB — BASIC METABOLIC PANEL
Anion gap: 8 (ref 5–15)
BUN: 19 mg/dL (ref 6–20)
CHLORIDE: 99 mmol/L — AB (ref 101–111)
CO2: 28 mmol/L (ref 22–32)
Calcium: 9.6 mg/dL (ref 8.9–10.3)
Creatinine, Ser: 1.2 mg/dL — ABNORMAL HIGH (ref 0.44–1.00)
GFR calc Af Amer: 49 mL/min — ABNORMAL LOW (ref >60)
GFR calc non Af Amer: 42 mL/min — ABNORMAL LOW (ref >60)
GLUCOSE: 101 mg/dL — AB (ref 65–99)
POTASSIUM: 5.3 mmol/L — AB (ref 3.5–5.1)
Sodium: 135 mmol/L (ref 135–145)

## 2015-05-20 MED ORDER — CLOPIDOGREL BISULFATE 75 MG PO TABS: 75.0000 mg | ORAL_TABLET | Freq: Every day | ORAL | Status: AC

## 2015-05-20 MED ORDER — ATORVASTATIN CALCIUM 80 MG PO TABS: 80.0000 mg | ORAL_TABLET | Freq: Every day | ORAL | Status: AC

## 2015-05-20 MED ORDER — NITROGLYCERIN 0.4 MG SL SUBL: 0.4000 mg | SUBLINGUAL_TABLET | SUBLINGUAL | Status: DC | PRN

## 2015-05-20 MED ORDER — LORAZEPAM 0.5 MG PO TABS: 0.5000 mg | ORAL_TABLET | Freq: Three times a day (TID) | ORAL | Status: AC | PRN

## 2015-05-20 MED ORDER — FUROSEMIDE 20 MG PO TABS
20.0000 mg | ORAL_TABLET | Freq: Once | ORAL | Status: AC
  Administered 2015-05-20: 20 mg via ORAL
  Filled 2015-05-20: qty 1

## 2015-05-20 MED ORDER — CLOPIDOGREL BISULFATE 75 MG PO TABS
300.0000 mg | ORAL_TABLET | Freq: Once | ORAL | Status: AC
  Administered 2015-05-20: 300 mg via ORAL
  Filled 2015-05-20: qty 4

## 2015-05-20 MED ORDER — CLOPIDOGREL BISULFATE 75 MG PO TABS: 75.0000 mg | ORAL_TABLET | Freq: Every day | ORAL | Status: DC

## 2015-05-20 NOTE — Progress Notes (Signed)
Occupational Therapy Treatment Patient Details Name: [name] MRN: [id number] DOB: [birth year] Today's Date: 05/20/2015    History of present illness Pt is a 81 y.o. Female with L proximal humerus fx from fall on 5/21 with closed reduction on 5/22. Pt d/c to SNF and had a fall about a week after surgery with loss of alignment. Pt underwent right/left cardiac cath on 6/8 to clear for surgery. Pt is now s/p L humerus ORIF on 05/15/15. PMH pernicioius anemia, OA, fibromyalgia, HTN, COPD, esophageal adenocarcinoma. Pt also has L thumb proximal phalanx fx from previous fall.    OT comments  Patient progressing towards goals, continue plan of care for now. Educated patient on LUE HEP, see exercises completed below. Patient made comment that after therapy she seems to feel better.    Follow Up Recommendations  SNF;Supervision/Assistance - 24 hour    Equipment Recommendations  None recommended by OT    Recommendations for Other Services  None at this time   Precautions / Restrictions Precautions Precautions: Fall;Shoulder Type of Shoulder Precautions: Passive Protocol Shoulder Interventions: Shoulder sling/immobilizer;Off for dressing/bathing/exercises Precaution Comments: PROM 0-45 FF, 0-30 ER, No ABD; AROM elbow/wrist/hand. Has L thumb spica.  Required Braces or Orthoses: Sling Restrictions Weight Bearing Restrictions: Yes LUE Weight Bearing: Non weight bearing     Mobility Bed Mobility Overal bed mobility: Modified Independent General bed mobility comments: Use of bed rail and increased time  Transfers General transfer comment: did not occur    Balance Overall balance assessment: Needs assistance Sitting-balance support: Single extremity supported Sitting balance-Leahy Scale: Fair   ADL Overall ADL's : Needs assistance/impaired General ADL Comments: Educated patient on LUE PROM exercises, see below.      Cognition   Behavior During Therapy: WFL for tasks  assessed/performed Overall Cognitive Status: Within Functional Limits for tasks assessed      Exercises General Exercises - Upper Extremity Shoulder Flexion: PROM;Left;10 reps;Seated (lap slides) Shoulder Extension: PROM;Left;10 reps;Seated Elbow Flexion: AROM;Left;10 reps;Seated Elbow Extension: AROM;Left;10 reps;Seated Wrist Flexion: AROM;Left;10 reps;Seated Wrist Extension: AROM;Left;10 reps;Seated Digit Composite Flexion: AROM;Left;10 reps;Seated Composite Extension: AROM;10 reps;Seated Shoulder Exercises Shoulder External Rotation: PROM;Left;10 reps;Seated           Pertinent Vitals/ Pain       Pain Assessment: 0-10 Pain Score: 8  Pain Location: left shoulder, but pt states it gets better after therapy Pain Descriptors / Indicators: Aching Pain Intervention(s): Monitored during session;Repositioned         Frequency Other (comment) (5 x per week for UE exercise, 2X per week for OOB)     Progress Toward Goals  OT Goals(current goals can now be found in the care plan section)  Progress towards OT goals: Progressing toward goals     Plan Discharge plan remains appropriate    End of Session Equipment Utilized During Treatment: Other (comment) (sling > LUE)   Activity Tolerance Patient tolerated treatment well   Patient Left in bed;with call bell/phone within reach;with bed alarm set   Time: 2811-8867 OT Time Calculation (min): 22 min  Charges: OT General Charges $OT Visit: 1 Procedure OT Treatments $Therapeutic Exercise: 8-22 mins  Jaicob Dia , MS, OTR/L, CLT Pager: [phone]   05/20/2015, 3:37 PM

## 2015-05-20 NOTE — Clinical Social Work Placement (Signed)
   CLINICAL SOCIAL WORK PLACEMENT  NOTE  Date:  05/20/2015  Patient Details  Name: [name] MRN: [id number] Date of Birth: 30-May-1934  Clinical Social Work is seeking post-discharge placement for this patient at the [place] level of care (*CSW will initial, date and re-position this form in  chart as items are completed):  Yes   Patient/family provided with [facility] Work Department's list of facilities offering this level of care within the geographic area requested by the patient (or if unable, by the patient's family).  Yes   Patient/family informed of their freedom to choose among providers that offer the needed level of care, that participate in Medicare, Medicaid or managed care program needed by the patient, have an available bed and are willing to accept the patient.  Yes   Patient/family informed of [facility]'s ownership interest in [facility] and [facility], as well as of the fact that they are under no obligation to receive care at these facilities.  PASRR submitted to [employer] on  (n/a)     PASRR number received on  (n/a)     Existing PASRR number confirmed on 05/20/15     FL2 transmitted to all facilities in geographic area requested by pt/family on 05/20/15     FL2 transmitted to all facilities within larger geographic area on  (n/a)     Patient informed that his/her managed care company has contracts with or will negotiate with certain facilities, including the following:   (yes, [facility])     Yes   Patient/family informed of bed offers received.  Patient chooses bed at [name]     Physician recommends and patient chooses bed at  (n/a)    Patient to be transferred to [name] on 05/20/15.  Patient to be transferred to facility by [facility]     Patient family notified on 05/20/15 of transfer.  Name of family member notified:  Patient's son, [name], updated.      PHYSICIAN       Additional Comment:    _______________________________________________ Caroline Sauger, LCSW 05/20/2015, 2:07 PM [phone]

## 2015-05-20 NOTE — Discharge Summary (Signed)
Discharge Summary   Patient ID: [name],  [name], [name] 81 y.o.  Admit date: 05/14/2015 Discharge date: 05/20/2015  Primary Care Provider: [place], [name] Primary Cardiologist: [name]  Discharge Diagnoses Principal Problem:   CAD (coronary artery disease), native coronary artery Active Problems:   Pernicious anemia   Adenocarcinoma of gastroesophageal junction   HX of multiple bleeding ulcers   Hypertension   CAP (community acquired pneumonia)   Diastolic CHF, acute on chronic   COPD with acute exacerbation   Shoulder fracture, left   Allergies Allergies  Allergen Reactions  . Keflex [Cephalexin] Itching    "severe itching"  . Nulecit [Na Ferric Gluc Cplx In Sucrose] Diarrhea, Nausea Only and Other (See Comments)      drop in BP  . Biaxin [Clarithromycin] Itching  . Fentanyl Itching  . Spiriva Handihaler [Tiotropium Bromide Monohydrate] Other (See Comments)    constipation  . Tramadol Itching    Skin burning and itching  . Adhesive [Tape] Itching  . Clarithromycin Itching  . Darvon Itching  . Epinephrine Itching  . Morphine And Related Itching  . Oxycodone Itching  . Sulfa Antibiotics Itching  . Vicodin [Hydrocodone-Acetaminophen] Itching    Can take with Benadryl    Procedures  Echocardiogram 05/12/2015 LV EF: 40% -  45%  ------------------------------------------------------------------- Indications:   Palpitations 785.1.  ------------------------------------------------------------------- History:  PMH:  Murmur. Chronic obstructive pulmonary disease. Risk factors: Current tobacco use. Hypertension.  ------------------------------------------------------------------- Study Conclusions  - Left ventricle: The cavity size was normal. Wall thickness was normal. Systolic function was mildly to moderately reduced. The estimated ejection fraction was in the range of 40% to 45%. Mild diffuse hypokinesis with  no identifiable regional variations. Features are consistent with a pseudonormal left ventricular filling pattern, with concomitant abnormal relaxation and increased filling pressure (grade 2 diastolic dysfunction). - Aortic valve: There was moderate stenosis. There was trivial regurgitation. - Mitral valve: Moderately to severely fibrotic annulus. Mildly thickened leaflets . There was mild regurgitation directed centrally. Valve area by continuity equation (using LVOT flow): 1.67 cm^2. - Left atrium: The atrium was moderately dilated. - Pulmonary arteries: Systolic pressure was mildly increased. PA peak pressure: 46 mm Hg (S).    Cardiac catheterization 05/14/2015 Conclusion     Prox RCA to Mid RCA lesion, 99% stenosed.  Dist RCA lesion, 40% stenosed.  Ost RPDA lesion, 80% stenosed.  Ost LM to LM lesion, Yolanda% stenosed.  1. 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 Moderately severe stenosis ostial left main artery 3. Severe stenosis mid RCA 4. Mild LV systolic dysfunction 5. Pulmonary HTN  Recommendations: This a complex situation. She is an elderly female with left arm fracture with need for surgical correction. She has no angina but is found to have severe multi-vessel CAD. Her CAD would be best approached with bypass surgery but she is a poor candidate for CABG given advanced age, severe COPD, chronic steroid use and poor mobility. She is also a poor candidate for stenting given the location of the left main disease and history of chronic anemia. Her RCA could be easily treated with PCI. The moderately severe left main stenosis would be more difficult to treat with PCI.   I will admit to telemetry. I see three options here. The first option would be CABG so I will ask CT surgery to see her but I do not think she is a good candidate for an open chest procedure because  of reasons outlined above. If CT surgery does not feel that  she would be a surgical candidate, would have to consider PCI of the RCA with medical management of the left main lesion. We could also consider palliation with her overall poor functional status and poor prognosis. Will continue home meds. Will not start Plavix until we have a better disposition.         Hospital Course  The patient is a 81 year old Caucasian female with PMH of severe COPD, pernicious anemia, prior GI bleed, HTN, esophageal adenocarcinoma, GERD who was seen by [name] in the office on 05/08/2015 as part of preoperative cardiac clearance for ORIF of humeral fracture which was the result of mechanical fall. She has remote history of tobacco use. During the visit, she complained of rapid onset of palpitation associated with neck pain and left arm discomfort for the past 2 months. She was placed on cardiac monitor for arrhythmia. It was decided for the patient to undergo cardiac catheterization in order to clear her for orthopedic surgery.  She presented to [name] on 6/80/2016 for outpatient diagnostic cardiac catheterization. Unfortunately, cardiac catheterization showed severe multivessel CAD include significant ost LM and mid RCA dx which would be best approached by bypass surgery, however she is a poor candidate for CABG given her advanced age, severe COPD, chronic steroid-induced use and poor mobility. High risk PCI could be considered, however cardiac catheterization showed severe left main disease that is very difficult to approach by PCI given ostial location. She was seen by Dr. Roxan Hockey of cardiothoracic surgery team on the same day, it was felt that her risk associated with surgery is prohibitive. After review by interventional cardiology team, given the fact the patient has not have any chest pain, it was decided to treat the case medically. She was seen by Dr. Tamala Julian on 05/15/2015, after talking with the patient, she is willing to go through with orthopedic  surgery despite high risk. She underwent open reduction and internal fixation of left proximal humerus fracture on 05/15/2015. She had EBL of 125 ML. Post procedure, she recovered well. Throughout the entire hospitalization, patient never had any chest pain. She did have some SVT however controlled on atenolol.   She was seen in the morning of 05/20/2015, at which time she was doing well. She has this chronic cough for several weeks, however chest x-ray showed only mild changes. I have given her additional dose of Lasix, however unclear whether her symptom is related to bronchitis versus heart failure. According to the patient, she has been coughing for the past several weeks. I have instructed the patient to seek medical attention if she has any fever or chill. Overall, she is deemed stable for discharge from cardiology perspective. I have talked with Dr. Marlou Sa with orthopedic, she is cleared for discharge from orthopedic perspective. Given her significant coronary dx, I have discussed with Dr. Marlou Sa, he is ok for her to start on plavix today. I have arranged 7 day TOC followup for her. If she is symptomatic, would have very low threshold to potentially do PCI of RCA, although L main would continue to pose a problem.   Discharge Vitals Blood pressure 125/65, pulse 81, temperature 98 F (36.7 C), temperature source Axillary, resp. rate 18, height 5\' 3"  (1.6 m), weight 136 lb 11 oz (62 kg), SpO2 96 %.  Filed Weights   05/14/15 0847 05/15/15 0000  Weight: 140 lb (63.504 kg) 136 lb 11 oz (62 kg)  Labs  CBC  Recent Labs  05/19/15 1620  WBC 11.3*  HGB 9.6*  HCT 31.5*  MCV 85.1  PLT 735*   Basic Metabolic Panel  Recent Labs  05/19/15 1620 05/20/15 0515  NA 133* 135  K 5.1 5.3*  CL 98* 99*  CO2 25 28  GLUCOSE 111* 101*  BUN 20 19  CREATININE 1.32* 1.20*  CALCIUM 9.3 9.6    Disposition  Pt is being discharged to nursing home today in good condition.  Follow-up Plans &  Appointments      Follow-up Information    Follow up with Meredith Pel, MD In 2 weeks.   Specialty:  Orthopedic Surgery   Contact information:   [place] [address] [phone]       Follow up with Richardson Dopp, PA-C On 05/27/2015.   Specialties:  Physician Assistant, Radiology, Interventional Cardiology   Why:  10:30am. Transition of Care followup   Contact information:   [address] [place] [address] [phone]       Discharge Medications    Medication List    TAKE these medications        acetaminophen 500 MG tablet  Commonly known as:  TYLENOL  Take 2 tablets (1,000 mg total) by mouth 3 (three) times daily.     albuterol 108 (90 BASE) MCG/ACT inhaler  Commonly known as:  PROVENTIL HFA;VENTOLIN HFA  Inhale 2 puffs into the lungs every 4 (four) hours as needed for wheezing (wheezing).     albuterol (2.5 MG/3ML) 0.083% nebulizer solution  Commonly known as:  PROVENTIL  Take 3 mLs (2.5 mg total) by nebulization every 2 (two) hours as needed for wheezing or shortness of breath.     amLODipine 2.5 MG tablet  Commonly known as:  NORVASC  Take 2.5 mg by mouth every morning.     aspirin EC 81 MG tablet  Take 81 mg by mouth every morning.     atenolol 50 MG tablet  Commonly known as:  TENORMIN  Take 50 mg by mouth 2 (two) times daily.     atorvastatin 80 MG tablet  Commonly known as:  LIPITOR  Take 1 tablet (80 mg total) by mouth daily at 6 PM.     clopidogrel Yolanda MG tablet  Commonly known as:  PLAVIX  Take 1 tablet (Yolanda mg total) by mouth daily.  Start taking on:  05/21/2015     CULTURELLE Caps  Take 1 capsule by mouth every morning.     cyanocobalamin 1000 MCG/ML injection  Commonly known as:  (VITAMIN B-12)  Inject 1,000 mcg into the muscle every 6 (six) weeks.     ergocalciferol 50000 UNITS capsule  Commonly known as:  VITAMIN D2  Take 50,000 Units by mouth every 14 (fourteen) days.      esomeprazole 20 MG capsule  Commonly known as:  NEXIUM  Take 20 mg by mouth daily at 12 noon.     Fluticasone Furoate-Vilanterol 200-25 MCG/INH Aepb  Inhale 2 puffs into the lungs every morning.     furosemide 40 MG tablet  Commonly known as:  LASIX  Take 1 tablet (40 mg total) by mouth every morning.     guaiFENesin 600 MG 12 hr tablet  Commonly known as:  MUCINEX  Take 1 tablet (600 mg total) by mouth 2 (two) times daily.     hydroxypropyl methylcellulose / hypromellose 2.5 % ophthalmic solution  Commonly known as:  ISOPTO TEARS /  GONIOVISC  Place 1 drop into both eyes 3 (three) times daily as needed for dry eyes.     ipratropium-albuterol 0.5-2.5 (3) MG/3ML Soln  Commonly known as:  DUONEB  Take 3 mLs by nebulization every 6 (six) hours.     lisinopril 40 MG tablet  Commonly known as:  PRINIVIL,ZESTRIL  Take 40 mg by mouth every evening.     loratadine 10 MG tablet  Commonly known as:  CLARITIN  Take 10 mg by mouth every morning.     LORazepam 0.5 MG tablet  Commonly known as:  ATIVAN  Take 1 tablet (0.5 mg total) by mouth every 8 (eight) hours as needed for anxiety.     nitroGLYCERIN 0.4 MG SL tablet  Commonly known as:  NITROSTAT  Place 1 tablet (0.4 mg total) under the tongue every 5 (five) minutes as needed for chest pain.     polyethylene glycol packet  Commonly known as:  MIRALAX / GLYCOLAX  Take 17 g by mouth daily.     potassium chloride SA 20 MEQ tablet  Commonly known as:  K-DUR,KLOR-CON  Take 1 tablet (20 mEq total) by mouth daily.     predniSONE 1 MG tablet  Commonly known as:  DELTASONE  Take 2 mg by mouth daily with breakfast.     saccharomyces boulardii 250 MG capsule  Commonly known as:  FLORASTOR  Take 1 capsule (250 mg total) by mouth 2 (two) times daily.     senna 8.6 MG Tabs tablet  Commonly known as:  SENOKOT  Take 1 tablet (8.6 mg total) by mouth at bedtime.     sodium chloride 0.65 % Soln nasal spray  Commonly known as:  OCEAN   Place 1 spray into both nostrils as needed for congestion.     ST JOHNS WORT PO  Take 1 capsule by mouth 2 (two) times daily.     tiZANidine 4 MG tablet  Commonly known as:  ZANAFLEX  Take 0.5-1 tablets (2-4 mg total) by mouth every 8 (eight) hours as needed for muscle spasms.        Duration of Discharge Encounter   Greater than 30 minutes including physician time.  Hilbert Corrigan PA-C Pager: [phone] 05/20/2015, 1:50 PM   Agree with note by Almyra Deforest PA-C   Lorretta Harp, M.D., [place], [place], FAHA, [place] [address] [place], [place]  [address]  [phone] 05/20/2015 2:24 PM

## 2015-05-20 NOTE — Progress Notes (Signed)
Patient Name: [name] Date of Encounter: 05/20/2015  Primary Cardiologist: Dr. Radford Pax   Active Problems:   CAD (coronary artery disease), native coronary artery   Coronary artery disease involving native coronary artery of native heart without angina pectoris   Shoulder fracture, left    SUBJECTIVE  Continue to cough, apparently she has been coughing for several month now. Occasional clear phlegm. CXR done yesterday, new R perihilar infiltrate, otherwise, lung clear.   CURRENT MEDS . sodium chloride   Intravenous Once  . sodium chloride  10 mL/hr Intravenous Once  . amLODipine  2.5 mg Oral q morning - 10a  . aspirin EC  81 mg Oral q morning - 10a  . atenolol  50 mg Oral BID  . atorvastatin  80 mg Oral q1800  . Fluticasone Furoate-Vilanterol  2 puff Inhalation q morning - 10a  . furosemide  20 mg Oral Once  . furosemide  40 mg Oral q morning - 10a  . guaiFENesin  600 mg Oral BID  . ibuprofen  200 mg Oral BID  . ipratropium-albuterol  3 mL Nebulization Q6H  . lisinopril  40 mg Oral QPM  . pantoprazole  40 mg Oral Daily  . potassium chloride SA  20 mEq Oral Daily  . predniSONE  2 mg Oral Q breakfast  . senna  1 tablet Oral QHS  . sodium chloride  3 mL Intravenous Q12H    OBJECTIVE  Filed Vitals:   05/19/15 2040 05/19/15 2116 05/20/15 0139 05/20/15 0622  BP:  122/66  125/65  Pulse: 85 80 71 81  Temp:  98.3 F (36.8 C)  98 F (36.7 C)  TempSrc:  Axillary    Resp: 20 18 18    Height:      Weight:      SpO2: 96% 96% 95% 96%    Intake/Output Summary (Last 24 hours) at 05/20/15 1118 Last data filed at 05/20/15 1000  Gross per 24 hour  Intake    120 ml  Output    200 ml  Net    -80 ml   Filed Weights   05/14/15 0847 05/15/15 0000  Weight: 140 lb (63.504 kg) 136 lb 11 oz (62 kg)    PHYSICAL EXAM  General: Pleasant, NAD. Neuro: Alert and oriented X 3.  Psych: Normal affect. HEENT:  Normal  Neck: Supple without bruits or JVD. Lungs:  Resp regular  and unlabored. Decreased breath sound with intermittent rale and rhonchi.  Heart: RRR no s3, s4, or murmurs. Abdomen: Soft, non-tender, non-distended, BS + x 4.  Extremities: No clubbing, cyanosis or edema. DP/PT/Radials 2+ and equal bilaterally. L arm in sling.   Accessory Clinical Findings   ECG  No new EKG  Echocardiogram 05/12/2015  LV EF: 40% -  45%  ------------------------------------------------------------------- Indications:   Palpitations 785.1.  ------------------------------------------------------------------- History:  PMH:  Murmur. Chronic obstructive pulmonary disease. Risk factors: Current tobacco use. Hypertension.  ------------------------------------------------------------------- Study Conclusions  - Left ventricle: The cavity size was normal. Wall thickness was normal. Systolic function was mildly to moderately reduced. The estimated ejection fraction was in the range of 40% to 45%. Mild diffuse hypokinesis with no identifiable regional variations. Features are consistent with a pseudonormal left ventricular filling pattern, with concomitant abnormal relaxation and increased filling pressure (grade 2 diastolic dysfunction). - Aortic valve: There was moderate stenosis. There was trivial regurgitation. - Mitral valve: Moderately to severely fibrotic annulus. Mildly thickened leaflets . There was mild regurgitation directed centrally. Valve area by  continuity equation (using LVOT flow): 1.67 cm^2. - Left atrium: The atrium was moderately dilated. - Pulmonary arteries: Systolic pressure was mildly increased. PA peak pressure: 46 mm Hg (S).    Radiology/Studies  Dg Chest 1 View  05/19/2015   CLINICAL DATA:  Cough  EXAM: CHEST  1 VIEW  COMPARISON:  05/08/2015  FINDINGS: Cardiac shadow is again mildly enlarged. The lungs are well aerated bilaterally. Patchy infiltrate is noted in the right suprahilar region new from the prior  exam. Changes of prior vertebral augmentation are seen. Stable blunting of the costophrenic angles is noted.  IMPRESSION: New right perihilar infiltrate.   Electronically Signed   By: Inez Catalina M.D.   On: 05/19/2015 16:08   Dg Chest 1 View  04/26/2015   CLINICAL DATA:  Patient slipped and fell in the bathroom at home, injuring the left shoulder. Initial encounter.  EXAM: CHEST  1 VIEW  COMPARISON:  01/08/2015 and earlier.  FINDINGS: Cardiac silhouette upper normal in size, unchanged. Thoracic aorta atherosclerotic, unchanged. Right perihilar oval-shaped opacity, shown on prior CT to represent focal dilation of the intralobar pulmonary artery. Hilar and mediastinal contours otherwise unremarkable. Mildly prominent bronchovascular markings diffusely, unchanged. Minimal linear scarring in the left lung base, unchanged. No new pulmonary parenchymal abnormalities. No visible pleural effusions.  IMPRESSION: No acute cardiopulmonary disease.   Electronically Signed   By: Evangeline Dakin M.D.   On: 04/26/2015 12:56   Dg Chest 2 View  05/09/2015   CLINICAL DATA:  Acute on chronic CHF; abnormal chest exam, shortness of breath, history of COPD.  EXAM: CHEST  2 VIEW  COMPARISON:  Portable chest x-ray of Apr 29, 2015  FINDINGS: The lungs are mildly hyperinflated with hemidiaphragm flattening. There is subsegmental atelectasis in the lingula. Small amounts of pleural fluid blunt the costophrenic angles posteriorly. The cardiac silhouette is enlarged. The pulmonary vascularity is normal. There is prominent thoracic kyphosis. The patient has undergone kyphoplasty of 3 mid to lower thoracic levels.  IMPRESSION: COPD with tiny posterior pleural effusions. Stable mild cardiomegaly. Marked improvement in the appearance of the chest is consistent with resolution of pulmonary edema.   Electronically Signed   By: David  Martinique M.D.   On: 05/09/2015 07:50   Ct Shoulder Left Wo Contrast  04/26/2015   CLINICAL DATA:  Displaced  left proximal humeral fracture.  EXAM: CT OF THE LEFT SHOULDER WITHOUT CONTRAST  TECHNIQUE: Multidetector CT imaging was performed according to the standard protocol. Multiplanar CT image reconstructions were also generated.  COMPARISON:  04/26/2015.  FINDINGS: Highly displaced surgical neck fracture of the left proximal humerus with 1.2 cm overlap, 3.1 cm anterior displacement of the distal fracture fragment, and 2.6 cm medial displacement of the distal fracture fragment. Accordingly, the metaphysis and shaft are significantly anteromedial to the humeral head, in the fractured surfaces are not in direct contact. The humeral head continues to articulate with the glenoid in the expected fashion.  There is a fracture the greater tuberosity but this is nondisplaced. No lesser tuberosity fracture fragment. I do not observe a scapular fracture or an adjacent rib fracture. T5 compression fracture may well be chronic. T6, T8, and T9 thoracic compression fractures with vertebral augmentations.  Atherosclerotic aortic arch.  IMPRESSION: 1. Two part surgical neck fracture left proximal humerus with the metaphysis/shaft fragment displaced 3.1 cm anterior and 2.6 cm medially with respect to the femoral head, which continues to articulate with the glenoid. There is 1.2 cm of overlap and the fractured surfaces  are not contiguous. Please note that although this is only at 2 part fracture, the current degree of displacement is striking. 2. There is a nondisplaced fracture the greater tuberosity. Because this is nondisplaced, it does not make this fracture a 3 part fracture. 3. T5 compression fracture, probably chronic. Old T6, T8, and T9 thoracic compression fractures with vertebral augmentations.   Electronically Signed   By: Van Clines M.D.   On: 04/26/2015 16:32   Dg Chest Port 1 View  04/29/2015   CLINICAL DATA:  Patient with recent diagnosis of pneumonia.  EXAM: PORTABLE CHEST - 1 VIEW  COMPARISON:  Chest radiograph  04/26/2015; 01/08/2015  FINDINGS: Enlarged cardiac and mediastinal contours. Bilateral predominately perihilar interstitial pulmonary opacities. Layering bilateral pleural effusions and underlying pulmonary consolidation. Re- demonstrated comminuted fracture through the proximal left humerus.  IMPRESSION: Cardiomegaly with interstitial pulmonary opacities favored to represent edema.  Layering bilateral pleural effusions and underlying pulmonary consolidation likely representing atelectasis.   Electronically Signed   By: Lovey Newcomer M.D.   On: 04/29/2015 11:41   Dg Shoulder Left  05/15/2015   CLINICAL DATA:  Fracture fixation  EXAM: DG C-ARM 61-120 MIN; LEFT SHOULDER - 2+ VIEW  COMPARISON:  none  FINDINGS: ORIF with metal plate and multiple screws in the left humerus and left proximal humeral shaft in satisfactory position and alignment. Fracture line not identified on the studies. There appears to be bone cement overlying the lateral humeral head.  IMPRESSION: Satisfactory fracture fixation.   Electronically Signed   By: Franchot Gallo M.D.   On: 05/15/2015 15:45   Dg Shoulder Left  04/27/2015   CLINICAL DATA:  Closed manipulation of left humerus dislocation.  EXAM: LEFT SHOULDER - 2+ VIEW; DG C-ARM 1-60 MIN - NRPT [facility]  COMPARISON:  Preoperative radiographs and CT.  FINDINGS: Two fluoroscopic spot images of the left shoulder during closed manipulation demonstrates improved alignment of the proximal humerus fracture. Fluoroscopy time reported 6 seconds.  IMPRESSION: Intraoperative fluoroscopy during close manipulation of left proximal humerus fracture, the fracture is in improved alignment.   Electronically Signed   By: Jeb Levering M.D.   On: 04/27/2015 03:29   Dg Shoulder Left  04/26/2015   CLINICAL DATA:  Patient status post fall with left shoulder pain. History of cancer.  EXAM: LEFT SHOULDER - 2+ VIEW  COMPARISON:  Chest radiograph 04/26/2015; 07/25/2014  FINDINGS: There is a markedly comminuted  fracture of the proximal left humerus involving the left humeral head. The humeral head appears to be located on limited views however the proximal left humeral diaphysis appears to be medially dislocated. There is contour abnormality of the mid aspect of the left clavicle which is likely secondary to old healed clavicle fracture. Kyphoplasty material within the visualized thoracic spine. Multiple soft tissue calcifications.  IMPRESSION: Comminuted proximal left humerus fracture. The proximal humeral diaphysis appears to be dislocated medially to the humeral head.  Likely chronic deformity of the mid body of the left clavicle, potentially secondary to old healed fracture. Recommend correlation with dedicated clavicle views.   Electronically Signed   By: Lovey Newcomer M.D.   On: 04/26/2015 12:53   Dg Shoulder Left Port  05/15/2015   CLINICAL DATA:  81 year old female with a history of ORIF left shoulder  EXAM: LEFT SHOULDER - 1 VIEW  COMPARISON:  Intraoperative images 6 08/2015, plain film 04/27/2015  FINDINGS: Compared to the plain film 04/27/2015 there have been interval changes of open reduction internal fixation of left humeral  fracture. Anatomic alignment is maintained. Glenohumeral joint appears congruent.  Multiple clips at the mediastinum, with calcifications of the aortic arch again noted.  Changes of prior vertebroplasty.  IMPRESSION: Interval changes of open reduction internal fixation of left humerus fracture, with anatomic alignment maintained.  Signed,  Dulcy Fanny. Earleen Newport, DO  [facility]  [facility]   Electronically Signed   By: Corrie Mckusick D.O.   On: 05/15/2015 16:18   Dg Shoulder Left Port  04/27/2015   CLINICAL DATA:  Proximal left humerus fracture postreduction.  EXAM: LEFT SHOULDER - 1 VIEW  COMPARISON:  Pre reduction views 1 day prior.  FINDINGS: Improved alignment of the comminuted displaced proximal humerus fracture with persistent but improved  displacement of the humeral diaphysis. Glenohumeral alignment is suboptimally assessed, however humeral head is likely normally located. Soft tissue calcifications are again seen.  IMPRESSION: Improved alignment of comminuted displaced proximal humerus fracture. The glenohumeral alignment is suboptimally assessed, however humeral head is likely normally located.   Electronically Signed   By: Jeb Levering M.D.   On: 04/27/2015 02:18   Dg Hand Complete Left  04/26/2015   CLINICAL DATA:  Left hand pain after fall today. History of arthritis.  EXAM: LEFT HAND - COMPLETE 3+ VIEW  COMPARISON:  None.  FINDINGS: Advanced degenerative changes in the first carpometacarpal joint and IP joints diffusely. Joint space narrowing in the in the CP joints. There is a fracture through the proximal phalanx of the left thumb with mild angulation. No additional fracture noted.  IMPRESSION: Fracture through the base of the left thumb proximal phalanx with mild angulation.   Electronically Signed   By: Rolm Baptise M.D.   On: 04/26/2015 18:19   Dg C-arm 1-60 Min-no Report  04/27/2015   CLINICAL DATA:  Closed manipulation of left humerus dislocation.  EXAM: LEFT SHOULDER - 2+ VIEW; DG C-ARM 1-60 MIN - NRPT [facility]  COMPARISON:  Preoperative radiographs and CT.  FINDINGS: Two fluoroscopic spot images of the left shoulder during closed manipulation demonstrates improved alignment of the proximal humerus fracture. Fluoroscopy time reported 6 seconds.  IMPRESSION: Intraoperative fluoroscopy during close manipulation of left proximal humerus fracture, the fracture is in improved alignment.   Electronically Signed   By: Jeb Levering M.D.   On: 04/27/2015 03:29   Dg C-arm 61-120 Min  05/15/2015   CLINICAL DATA:  Fracture fixation  EXAM: DG C-ARM 61-120 MIN; LEFT SHOULDER - 2+ VIEW  COMPARISON:  none  FINDINGS: ORIF with metal plate and multiple screws in the left humerus and left proximal humeral shaft in satisfactory position and  alignment. Fracture line not identified on the studies. There appears to be bone cement overlying the lateral humeral head.  IMPRESSION: Satisfactory fracture fixation.   Electronically Signed   By: Franchot Gallo M.D.   On: 05/15/2015 15:45    ASSESSMENT AND PLAN  81 yo female with history of severe COPD, pernicious anemia, prior GI bleeding, HTN, esophageal adenocarcinoma, GERD who is admitted today following an outpatient diagnostic cardiac cath. Severe 3 vessel disease. Not a candidate for CABG per Dr. Roxan Hockey.  1. L humeral fracture s/p ORIF on 05/15/2015  - well healed. Talked with Ortho Dr. Marlou Sa, patient is cleared for discharge today, nurse to change dressing. Will discharge back to facility with 1 week TOC followup  2. preop ischemic workup - left heart cath 05/14/2015 EF 45-50% 99% prox RCA, 40% dist RCA, 80% ost RPDA, 75% ost left main lesion -  right heart cath 05/14/2015 CI 2.53, CO 4.22, mean wedge 55mmHg, RV 54/7/14 - seen by CT surgery, poor candidate for CABG - per Dr. Tamala Julian, PCI in the absence of anginal complaints and severity of disease has not been recommended. Will discuss with MD regarding possible medical management. Has not started on plavix given ortho procedure.  3. Possible acute on chronic diastolic HF  - only mild sign of fluid overload, CXR not really consistent with degree of cough. Afebrile, no sign of infection. Will give 60mg  instead of 40mg  daily of lasix. Hesitant to give too much lasix as pt states lasix make her very weak. Instructed patient to seek medical attention if has fever or chill.   4. HTN 5. GERD  6. Episodic SVT on telemetry: continue atenolol  Signed, Almyra Deforest PA-C Pager: [phone]    Agree with note by Almyra Deforest PA-C  Cardiac stable. Cleared by Ortho for DC. Exam benign. Agree with TOC 7 then ROV . Needs ortho F/U as well.   Lorretta Harp, M.D., [place], [facility], [place] [address] [place], [place]  [address]  [phone] 05/20/2015 11:51 AM

## 2015-05-20 NOTE — Progress Notes (Signed)
Report called to [facility]. Susie Cassette

## 2015-05-20 NOTE — Discharge Instructions (Signed)
[place] for prom as tolerated left arm with PT - avoid aarom until rov Keep incision dry  Please seek medical attention immediately if has chest pain, increasing shortness of breath, severe dizziness.

## 2015-05-20 NOTE — Discharge Planning (Signed)
Patient to be discharged to [facility]. Patient's son, Merry Proud, updated via phone.  Facility: [facility] RN report number: [phone] Transportation: EMS ([address])  Lubertha Sayres, [place] [phone]) and Surgical [phone])

## 2015-05-21 ENCOUNTER — Encounter: Payer: Self-pay | Admitting: Adult Health

## 2015-05-21 ENCOUNTER — Non-Acute Institutional Stay (SKILLED_NURSING_FACILITY): Payer: Medicare Other | Admitting: Adult Health

## 2015-05-21 DIAGNOSIS — I10 Essential (primary) hypertension: Secondary | ICD-10-CM

## 2015-05-21 DIAGNOSIS — S42302S Unspecified fracture of shaft of humerus, left arm, sequela: Secondary | ICD-10-CM | POA: Diagnosis not present

## 2015-05-21 DIAGNOSIS — K219 Gastro-esophageal reflux disease without esophagitis: Secondary | ICD-10-CM

## 2015-05-21 DIAGNOSIS — K5900 Constipation, unspecified: Secondary | ICD-10-CM | POA: Diagnosis not present

## 2015-05-21 DIAGNOSIS — I5033 Acute on chronic diastolic (congestive) heart failure: Secondary | ICD-10-CM

## 2015-05-21 DIAGNOSIS — K5909 Other constipation: Secondary | ICD-10-CM

## 2015-05-21 DIAGNOSIS — I2510 Atherosclerotic heart disease of native coronary artery without angina pectoris: Secondary | ICD-10-CM

## 2015-05-21 DIAGNOSIS — J441 Chronic obstructive pulmonary disease with (acute) exacerbation: Secondary | ICD-10-CM

## 2015-05-21 NOTE — Progress Notes (Signed)
Patient ID: [name], female   DOB: 06/21/1934, 81 y.o.   MRN: [id number]  [facility]     Allergies  Allergen Reactions  . Keflex [Cephalexin] Itching    "severe itching"  . Nulecit [Na Ferric Gluc Cplx In Sucrose] Diarrhea, Nausea Only and Other (See Comments)      drop in BP  . Biaxin [Clarithromycin] Itching  . Fentanyl Itching  . Spiriva Handihaler [Tiotropium Bromide Monohydrate] Other (See Comments)    constipation  . Tramadol Itching    Skin burning and itching  . Adhesive [Tape] Itching  . Clarithromycin Itching  . Darvon Itching  . Epinephrine Itching  . Morphine And Related Itching  . Oxycodone Itching  . Sulfa Antibiotics Itching  . Vicodin [Hydrocodone-Acetaminophen] Itching    Can take with Benadryl       Chief Complaint  Patient presents with  . Hospitalization Follow-up    HPI:  She has been hospitalized for a left shoulder ORIF. She has severe copd. Prior to her surgery; she was cleared for surgery by cardiology; had a cardiac cath and was found to have severe 3 vessel disease. She is going to be treated medically for her CAD. She is here for short term rehab with her goal to return home.    Past Medical History  Diagnosis Date  . Fibromyalgia   . Skin lesion     chronic calcific cutaneous lesions  . HX of multiple bleeding ulcers 09/01/2011  . Hypertension     takes Atenolol daily  . Emphysema   . Dizziness     d/t crystals in ears that "roll around"and can't get them out  . Osteoporosis   . Scoliosis   . Psoriasis   . H/O hiatal hernia   . GERD (gastroesophageal reflux disease)     takes Aciphex daily  . Gastric ulcer   . Constipation     since Chemo and Radiation;finished up in Nov 2012  . Diverticulosis   . History of colonic polyps   . Shingles     broke out 2wks ago;has been on meds and to finish up tomorrow  . Vitamin B deficiency   . COPD (chronic obstructive pulmonary disease)   . On home oxygen therapy     "2L;   24/7" (05/14/2015)  . Left humeral fracture 04/26/2015  . Scoliosis   . Complication of anesthesia     shortness of breath and pain with gas   . CHF (congestive heart failure)   . Exertional shortness of breath   . Pneumonia 2015 X 3  . Chronic bronchitis     "I about keep it"  . History of blood transfusion     "related to my anemia"  . Osteoarthritis   . Arthritis     "qwhere" (05/14/2015)  . Chronic upper back pain   . Kidney stone 25+yrs ago    "passed them"  . Adenocarcinoma of gastroesophageal junction   . Pernicious anemia   . Iron deficiency anemia   . Anemia, B12 deficiency     "get shots once/month" (05/14/2015)    Past Surgical History  Procedure Laterality Date  . Total hip arthroplasty Right 2001  . Tonsillectomy      as a child  . Tubal ligation  1975  . Appendectomy  1975  . Cataract extraction w/ intraocular lens  implant, bilateral Bilateral 10+yrs ago  . Esophagogastroduodenoscopy    . Colonoscopy    . Partial esophagectomy  12/20/2011  Procedure: ESOPHAGECTOMY PARTIAL;  Surgeon: Pierre Bali, MD;  Location: [facility];  Service: Thoracic;  Laterality: N/A;  . Total hip arthroplasty  06/30/2012    Procedure: TOTAL HIP ARTHROPLASTY ANTERIOR APPROACH;  Surgeon: Mcarthur Rossetti, MD;  Location: [facility];  Service: Orthopedics;  Laterality: Left;  Left total hip arthroplasty  . Shoulder closed reduction  04/26/2015    Procedure: CLOSED MANIPULATION SHOULDER;  Surgeon: Meredith Pel, MD;  Location: [facility];  Service: Orthopedics;;  . Cardiac catheterization N/A 05/14/2015    Procedure: Right/Left Heart Cath and Coronary Angiography;  Surgeon: Burnell Blanks, MD;  Location: [facility];  Service: Cardiovascular;  Laterality: N/A;  . Joint replacement    . Dilation and curettage of uterus    . Back surgery    . Fixation kyphoplasty thoracic spine  X 3    "broke my back in 6 places related to fall/coughing; 3 different cases"  . Orif humerus  fracture Left 05/15/2015    Procedure: Left OPEN REDUCTION INTERNAL FIXATION (ORIF) PROXIMAL HUMERUS FRACTURE;  Surgeon: Meredith Pel, MD;  Location: [facility];  Service: Orthopedics;  Laterality: Left;    VITAL SIGNS BP 104/64 mmHg  Pulse 80  Ht 5\' 3"  (1.6 m)  Wt 136 lb (61.689 kg)  BMI 24.10 kg/m2  SpO2 98%   Outpatient Encounter Prescriptions as of 05/21/2015  Medication Sig  . acetaminophen (TYLENOL) 500 MG tablet Take 2 tablets (1,000 mg total) by mouth 3 (three) times daily.  Marland Kitchen albuterol (PROVENTIL HFA;VENTOLIN HFA) 108 (90 BASE) MCG/ACT inhaler Inhale 2 puffs into the lungs every 4 (four) hours as needed for wheezing (wheezing).  Marland Kitchen albuterol (PROVENTIL) (2.5 MG/3ML) 0.083% nebulizer solution Take 3 mLs (2.5 mg total) by nebulization every 2 (two) hours as needed for wheezing or shortness of breath.  Marland Kitchen amLODipine (NORVASC) 2.5 MG tablet Take 2.5 mg by mouth every morning.   Marland Kitchen aspirin EC 81 MG tablet Take 81 mg by mouth every morning.   Marland Kitchen atenolol (TENORMIN) 50 MG tablet Take 50 mg by mouth 2 (two) times daily.  Marland Kitchen atorvastatin (LIPITOR) 80 MG tablet Take 1 tablet (80 mg total) by mouth daily at 6 PM.  . clopidogrel (PLAVIX) 75 MG tablet Take 1 tablet (75 mg total) by mouth daily.  . cyanocobalamin (,VITAMIN B-12,) 1000 MCG/ML injection Inject 1,000 mcg into the muscle every 6 (six) weeks.  . ergocalciferol (VITAMIN D2) 50000 UNITS capsule Take 50,000 Units by mouth every 14 (fourteen) days.   Marland Kitchen esomeprazole (NEXIUM) 20 MG capsule Take 20 mg by mouth daily at 12 noon.  . Fluticasone Furoate-Vilanterol 200-25 MCG/INH AEPB Inhale 2 puffs into the lungs every morning.  . furosemide (LASIX) 40 MG tablet Take 1 tablet (40 mg total) by mouth every morning.  Marland Kitchen guaiFENesin (MUCINEX) 600 MG 12 hr tablet Take 1 tablet (600 mg total) by mouth 2 (two) times daily.  . hydroxypropyl methylcellulose (ISOPTO TEARS) 2.5 % ophthalmic solution Place 1 drop into both eyes 3 (three) times daily as needed  for dry eyes.  Marland Kitchen ipratropium-albuterol (DUONEB) 0.5-2.5 (3) MG/3ML SOLN Take 3 mLs by nebulization every 6 (six) hours.  Marland Kitchen lisinopril (PRINIVIL,ZESTRIL) 40 MG tablet Take 40 mg by mouth every evening.   . loratadine (CLARITIN) 10 MG tablet Take 10 mg by mouth every morning.   Marland Kitchen LORazepam (ATIVAN) 0.5 MG tablet Take 1 tablet (0.5 mg total) by mouth every 8 (eight) hours as needed for anxiety.  . nitroGLYCERIN (NITROSTAT)  0.4 MG SL tablet Place 1 tablet (0.4 mg total) under the tongue every 5 (five) minutes as needed for chest pain.  . polyethylene glycol (MIRALAX / GLYCOLAX) packet Take 17 g by mouth daily.  . potassium chloride SA (K-DUR,KLOR-CON) 20 MEQ tablet Take 1 tablet (20 mEq total) by mouth daily.  . predniSONE (DELTASONE) 1 MG tablet Take 2 mg by mouth daily with breakfast.  . saccharomyces boulardii (FLORASTOR) 250 MG capsule Take 1 capsule (250 mg total) by mouth 2 (two) times daily.  Marland Kitchen senna (SENOKOT) 8.6 MG TABS tablet Take 1 tablet (8.6 mg total) by mouth at bedtime.  . sodium chloride (OCEAN) 0.65 % SOLN nasal spray Place 1 spray into both nostrils as needed for congestion.  . [facility] [employer] WORT PO Take 1 capsule by mouth 2 (two) times daily.  Marland Kitchen tiZANidine (ZANAFLEX) 4 MG tablet Take 0.5-1 tablets (2-4 mg total) by mouth every 8 (eight) hours as needed for muscle spasms.      SIGNIFICANT DIAGNOSTIC EXAMS  05-12-15: 2-d echo: Left ventricle: The cavity size was normal. Wall thickness was normal. Systolic function was mildly to moderately reduced. The estimated ejection fraction was in the range of 40% to 45%. Mild diffuse hypokinesis with no identifiable regional variations. Features are consistent with a pseudonormal left ventricular filling pattern, with concomitant abnormal relaxation and increased filling pressure (grade 2 diastolic dysfunction). - Aortic valve: There was moderate stenosis. There was trivial regurgitation. - Mitral valve: Moderately to severely fibrotic annulus.  Mildly thickened leaflets . There was mild regurgitation directed centrally. - Left atrium: The atrium was moderately dilated. - Pulmonary arteries: Systolic pressure was mildly increased.   05-14-15: cardiac cath: 1. 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 Moderately severe stenosis ostial left main artery 3. Severe stenosis mid RCA 4. Mild LV systolic dysfunction 5. Pulmonary HTN  05-15-15: left shoulder x-ray: Satisfactory fracture fixation.   05-19-15: chest x-ray: New right perihilar infiltrate.    LABS REVIEWED:   05-14-15: wbc 9.7; hgb 8.8; hct 29.3; mcv 88; plt 484;  05-15-15: wbc 8.8; hgb 9.6; hct 32.3; mcv 87.1; plt 465; glucose 84; bun 11; creat 0.84; k+4.2; na++137 05-19-15: wbc 11.3; hgb 9.6; hct 31.5; mv 85.1; plt 448; glucose 111; bun 20; creat 1.32; k+5.1; na++133    Review of Systems  Constitutional: Negative for malaise/fatigue.  Respiratory: Positive for cough and shortness of breath.   Cardiovascular: Negative for chest pain, palpitations and leg swelling.  Gastrointestinal: Negative for heartburn, abdominal pain and constipation.  Musculoskeletal: Positive for joint pain. Negative for myalgias.       Left shoulder pain   Skin: Negative.   Psychiatric/Behavioral: The patient is not nervous/anxious.      Physical Exam  Constitutional: She is oriented to person, place, and time. No distress.  Thin   Neck: Neck supple. No JVD present. No thyromegaly present.  Cardiovascular: Normal rate, regular rhythm and intact distal pulses.   Respiratory: Effort normal and breath sounds normal. No respiratory distress.  GI: Soft. Bowel sounds are normal. She exhibits no distension.  Musculoskeletal: She exhibits no edema.  Left arm in sling with arm in sling   Neurological: She is alert and oriented to person, place, and time.  Skin: Skin is warm and dry. She is not diaphoretic.  Incision line without signs of infection present.       ASSESSMENT/ PLAN:  1. Left humeral fracture: is status post ORIF. Will follow up  with orthopedics as indicated and will continue therapy as directed. Will continue zanaflex 2-4 mg every 8 hours as needed will monitor her status.   2. CAD; severe 3 vessel with an EF of 40-45%; no complaints of chest pain present; will continue plavix 75 mg daily; and asa 81 mg daily; ntg as needed  will monitor   3. Hypertension: is presently stable will continue norvasc 2.5 mg daily; tenormin 25 mg daily; lisinopril 40 mg daily;    4. Diastolic heart failure: will continue lasix 40 mg daily; with k+ 20 meq daily will monitor   5. Anxiety with depression: will continue [employer] John's wort twice daily and ativan 0,5 mg every 8 hours as needed and will monitor   6. COPD: will continue breo 2 puffs daily; will continue albuterol neb every 2 hours as needed and albuterol 2 puffs every 4 hours as needed; will increase her mucinex 1200 mg twice daily; will continue duoneb every 6 hours; claritin 10 mg daily; prednisone 2 mg daily   7. Dyslipidemia: will continue lipitor 80 mg daily;   8. Constipation: will continue miralax daily   9. Gerd: will continue nexium 20 mg daily   Time spent with patient 50 minutes.     Ok Edwards NP [facility]  Contact [phone] Monday through Friday 8am- 5pm  After hours call [phone]

## 2015-05-22 ENCOUNTER — Non-Acute Institutional Stay (SKILLED_NURSING_FACILITY): Payer: Medicare Other | Admitting: Internal Medicine

## 2015-05-22 ENCOUNTER — Encounter: Payer: Self-pay | Admitting: Internal Medicine

## 2015-05-22 DIAGNOSIS — K219 Gastro-esophageal reflux disease without esophagitis: Secondary | ICD-10-CM | POA: Insufficient documentation

## 2015-05-22 DIAGNOSIS — D510 Vitamin B12 deficiency anemia due to intrinsic factor deficiency: Secondary | ICD-10-CM | POA: Diagnosis not present

## 2015-05-22 DIAGNOSIS — J441 Chronic obstructive pulmonary disease with (acute) exacerbation: Secondary | ICD-10-CM | POA: Diagnosis not present

## 2015-05-22 DIAGNOSIS — C160 Malignant neoplasm of cardia: Secondary | ICD-10-CM

## 2015-05-22 DIAGNOSIS — Z967 Presence of other bone and tendon implants: Secondary | ICD-10-CM

## 2015-05-22 DIAGNOSIS — I25118 Atherosclerotic heart disease of native coronary artery with other forms of angina pectoris: Secondary | ICD-10-CM

## 2015-05-22 DIAGNOSIS — I5033 Acute on chronic diastolic (congestive) heart failure: Secondary | ICD-10-CM | POA: Diagnosis not present

## 2015-05-22 DIAGNOSIS — Z9889 Other specified postprocedural states: Secondary | ICD-10-CM

## 2015-05-22 DIAGNOSIS — Z8781 Personal history of (healed) traumatic fracture: Secondary | ICD-10-CM

## 2015-05-22 DIAGNOSIS — K5900 Constipation, unspecified: Secondary | ICD-10-CM

## 2015-05-22 DIAGNOSIS — I10 Essential (primary) hypertension: Secondary | ICD-10-CM | POA: Diagnosis not present

## 2015-05-22 DIAGNOSIS — K5909 Other constipation: Secondary | ICD-10-CM

## 2015-05-22 NOTE — Progress Notes (Signed)
Patient ID: CAREE Pitts, female   DOB: Nov 24, 1934, 81 y.o.   MRN: [id number]    HISTORY AND PHYSICAL   DATE: 05/22/15  Location:  [facility]    Place of Service: SNF [phone])   Extended Emergency Contact Information Primary Emergency Contact: Ziglar,Jeffrey Address: [address]          [place], [place] [address] Johnnette Litter of [place] Phone: [phone] Mobile Phone: [phone] Relation: Son Secondary Emergency Contact: Jamison [place] of [place] Phone: [phone] Relation: Brother  Advanced Directive information  FULL CODE  Chief Complaint  Patient presents with  . New Admit To SNF    HPI:  81 yo female seen today as a new admission into SNF following hospital stay for left humaral fx s/p fall, COPD exac with CAP, esophageal adenoCA, acute/chronic diastolic HF, CAD, HTN, GERD,hx bleeding ulcers and left thumb fx. 2D echo revealed EF 40-45% with moderate AS. Heart cath on 6/8th showed severe triple vessel disease in left main and RCA. She is being treated medically for CAD due to co-morbidities (COPD) and poor functional status. She went to the OR and had left humerus ORIF. She had to receive 1 unit PRBC postop. Today, she c/o sever pain in LUE. She is taking zanaflex for pain. BP controlled on lisinopril, atenolol, amlodipine and lasix. She takes ASA and plavix due to poor surgical candidate for CABG. She is on a statin. She is on nebs, fluticasone/vilanterol inhaker, albuterol HFA and prednisone for COPD. She is also taking floraster.nexium helps GERD. Pernicious anemia stable on B12 injections. Due to esophageal adenoCA, she gets nutritional supplements  Past Medical History  Diagnosis Date  . Fibromyalgia   . Skin lesion     chronic calcific cutaneous lesions  . HX of multiple bleeding ulcers 09/01/2011  . Hypertension     takes Atenolol daily  . Emphysema   . Dizziness     d/t crystals in ears that "roll around"and can't  get them out  . Osteoporosis   . Scoliosis   . Psoriasis   . H/O hiatal hernia   . GERD (gastroesophageal reflux disease)     takes Aciphex daily  . Gastric ulcer   . Constipation     since Chemo and Radiation;finished up in Nov 2012  . Diverticulosis   . History of colonic polyps   . Shingles     broke out 2wks ago;has been on meds and to finish up tomorrow  . Vitamin B deficiency   . COPD (chronic obstructive pulmonary disease)   . On home oxygen therapy     "2L;  24/7" (05/14/2015)  . Left humeral fracture 04/26/2015  . Scoliosis   . Complication of anesthesia     shortness of breath and pain with gas   . CHF (congestive heart failure)   . Exertional shortness of breath   . Pneumonia 2015 X 3  . Chronic bronchitis     "I about keep it"  . History of blood transfusion     "related to my anemia"  . Osteoarthritis   . Arthritis     "qwhere" (05/14/2015)  . Chronic upper back pain   . Kidney stone 25+yrs ago    "passed them"  . Adenocarcinoma of gastroesophageal junction   . Pernicious anemia   . Iron deficiency anemia   . Anemia, B12 deficiency     "get shots once/month" (05/14/2015)    Past Surgical History  Procedure Laterality Date  .  Total hip arthroplasty Right 2001  . Tonsillectomy      as a child  . Tubal ligation  1975  . Appendectomy  1975  . Cataract extraction w/ intraocular lens  implant, bilateral Bilateral 10+yrs ago  . Esophagogastroduodenoscopy    . Colonoscopy    . Partial esophagectomy  12/20/2011    Procedure: ESOPHAGECTOMY PARTIAL;  Surgeon: Pierre Bali, MD;  Location: [facility];  Service: Thoracic;  Laterality: N/A;  . Total hip arthroplasty  06/30/2012    Procedure: TOTAL HIP ARTHROPLASTY ANTERIOR APPROACH;  Surgeon: Mcarthur Rossetti, MD;  Location: [facility];  Service: Orthopedics;  Laterality: Left;  Left total hip arthroplasty  . Shoulder closed reduction  04/26/2015    Procedure: CLOSED MANIPULATION SHOULDER;  Surgeon: Meredith Pel,  MD;  Location: [facility];  Service: Orthopedics;;  . Cardiac catheterization N/A 05/14/2015    Procedure: Right/Left Heart Cath and Coronary Angiography;  Surgeon: Burnell Blanks, MD;  Location: [facility];  Service: Cardiovascular;  Laterality: N/A;  . Joint replacement    . Dilation and curettage of uterus    . Back surgery    . Fixation kyphoplasty thoracic spine  X 3    "broke my back in 6 places related to fall/coughing; 3 different cases"  . Orif humerus fracture Left 05/15/2015    Procedure: Left OPEN REDUCTION INTERNAL FIXATION (ORIF) PROXIMAL HUMERUS FRACTURE;  Surgeon: Meredith Pel, MD;  Location: [facility];  Service: Orthopedics;  Laterality: Left;    Patient [facility] Team: [name], MD as PCP - General (Family Medicine) Ladell Pier, MD (Internal Medicine) Teena Irani, MD (Gastroenterology) Nicanor Alcon, MD (Thoracic Surgery)  History   Social History  . Marital Status: Widowed    Spouse Name: N/A  . Number of Children: 5  . Years of Education: 12   Occupational History  . disabled    Social History Main Topics  . Smoking status: Former Smoker -- 3.00 packs/day for 45 years    Types: Cigarettes    Quit date: 12/07/1991  . Smokeless tobacco: Never Used  . Alcohol Use: No  . Drug Use: No  . Sexual Activity: No   Other Topics Concern  . 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 Her smoking use included Cigarettes. She has a 135 pack-year smoking history. She has never used smokeless tobacco. She reports that she does not drink alcohol or use illicit drugs.  Family History  Problem Relation Age of Onset  . Cancer Maternal Grandmother     gynecologic  . Cancer Maternal Aunt     gynecologic  . Breast cancer Paternal Aunt   . Breast cancer Paternal Aunt   . Breast cancer Paternal Aunt   .  Anesthesia problems Neg Hx   . Hypotension Neg Hx   . Malignant hyperthermia Neg Hx   . Pseudochol deficiency Neg Hx    Family Status  Relation Status Death Age  . Father Deceased 51  . Mother Alive   . Maternal Grandmother Deceased   . Maternal Grandfather Deceased   . Paternal Grandmother Deceased   . Paternal Grandfather Deceased   . Brother Alive   . Daughter Deceased   . Son Deceased   . Son Deceased     Immunization History  Administered Date(s) Administered  . Pneumococcal Conjugate-13 08/06/2014    Allergies  Allergen Reactions  . Keflex [Cephalexin] Itching    "severe itching"  . Nulecit [Na Ferric Gluc Cplx In Sucrose] Diarrhea, Nausea Only and Other (See Comments)      drop in BP  . Biaxin [Clarithromycin] Itching  . Fentanyl Itching  . Spiriva Handihaler [Tiotropium Bromide Monohydrate] Other (See Comments)    constipation  . Tramadol Itching    Skin burning and itching  . Adhesive [Tape] Itching  . Clarithromycin Itching  . Darvon Itching  . Epinephrine Itching  . Morphine And Related Itching  . Oxycodone Itching  . Sulfa Antibiotics Itching  . Vicodin [Hydrocodone-Acetaminophen] Itching    Can take with Benadryl    Medications: Patient's Medications  New Prescriptions   No medications on file  Previous Medications   ACETAMINOPHEN (TYLENOL) 500 MG TABLET    Take 2 tablets (1,000 mg total) by mouth 3 (three) times daily.   ALBUTEROL (PROVENTIL HFA;VENTOLIN HFA) 108 (90 BASE) MCG/ACT INHALER    Inhale 2 puffs into the lungs every 4 (four) hours as needed for wheezing (wheezing).   ALBUTEROL (PROVENTIL) (2.5 MG/3ML) 0.083% NEBULIZER SOLUTION    Take 3 mLs (2.5 mg total) by nebulization every 2 (two) hours as needed for wheezing or shortness of breath.   AMLODIPINE (NORVASC) 2.5 MG TABLET    Take 2.5 mg by mouth every morning.    ASPIRIN EC 81 MG TABLET    Take 81 mg by mouth every morning.    ATENOLOL (TENORMIN) 50 MG TABLET    Take 50 mg by mouth 2  (two) times daily.   ATORVASTATIN (LIPITOR) 80 MG TABLET    Take 1 tablet (80 mg total) by mouth daily at 6 PM.   CLOPIDOGREL (PLAVIX) 75 MG TABLET    Take 1 tablet (75 mg total) by mouth daily.   CYANOCOBALAMIN (,VITAMIN B-12,) 1000 MCG/ML INJECTION    Inject 1,000 mcg into the muscle every 6 (six) weeks.   ERGOCALCIFEROL (VITAMIN D2) 50000 UNITS CAPSULE    Take 50,000 Units by mouth every 14 (fourteen) days.    ESOMEPRAZOLE (NEXIUM) 20 MG CAPSULE    Take 20 mg by mouth daily at 12 noon.   FLUTICASONE FUROATE-VILANTEROL 200-25 MCG/INH AEPB    Inhale 2 puffs into the lungs every morning.   FUROSEMIDE (LASIX) 40 MG TABLET    Take 1 tablet (40 mg total) by mouth every morning.   GUAIFENESIN (MUCINEX) 600 MG 12 HR TABLET    Take 1 tablet (600 mg total) by mouth 2 (two) times daily.   HYDROXYPROPYL METHYLCELLULOSE (ISOPTO TEARS) 2.5 % OPHTHALMIC SOLUTION    Place 1 drop into both eyes 3 (three) times daily as needed for dry eyes.   IPRATROPIUM-ALBUTEROL (DUONEB) 0.5-2.5 (3) MG/3ML SOLN    Take 3 mLs by nebulization every 6 (six) hours.   LACTOBACILLUS RHAMNOSUS, GG, (CULTURELLE) CAPS    Take 1 capsule by mouth every morning.   LISINOPRIL (PRINIVIL,ZESTRIL) 40 MG TABLET    Take 40 mg by mouth every evening.    LORATADINE (CLARITIN) 10 MG TABLET    Take 10 mg by mouth every morning.    LORAZEPAM (ATIVAN) 0.5 MG TABLET    Take 1 tablet (0.5 mg total) by mouth every 8 (eight) hours as needed for anxiety.   NITROGLYCERIN (NITROSTAT) 0.4 MG SL TABLET    Place 1 tablet (0.4 mg total) under the tongue every 5 (five) minutes as  needed for chest pain.   POLYETHYLENE GLYCOL (MIRALAX / GLYCOLAX) PACKET    Take 17 g by mouth daily.   POTASSIUM CHLORIDE SA (K-DUR,KLOR-CON) 20 MEQ TABLET    Take 1 tablet (20 mEq total) by mouth daily.   PREDNISONE (DELTASONE) 1 MG TABLET    Take 2 mg by mouth daily with breakfast.   SACCHAROMYCES BOULARDII (FLORASTOR) 250 MG CAPSULE    Take 1 capsule (250 mg total) by mouth 2 (two)  times daily.   SENNA (SENOKOT) 8.6 MG TABS TABLET    Take 1 tablet (8.6 mg total) by mouth at bedtime.   SODIUM CHLORIDE (OCEAN) 0.65 % SOLN NASAL SPRAY    Place 1 spray into both nostrils as needed for congestion.   ST JOHNS WORT PO    Take 1 capsule by mouth 2 (two) times daily.   TIZANIDINE (ZANAFLEX) 4 MG TABLET    Take 0.5-1 tablets (2-4 mg total) by mouth every 8 (eight) hours as needed for muscle spasms.  Modified Medications   No medications on file  Discontinued Medications   No medications on file    Review of Systems  Constitutional: Negative for fever, chills, diaphoresis, activity change, appetite change and fatigue.  HENT: Negative for ear pain and sore throat.   Eyes: Negative for visual disturbance.  Respiratory: Positive for shortness of breath. Negative for cough and chest tightness.   Cardiovascular: Negative for chest pain, palpitations and leg swelling.  Gastrointestinal: Negative for nausea, vomiting, abdominal pain, diarrhea, constipation and blood in stool.  Genitourinary: Negative for dysuria.  Musculoskeletal: Positive for back pain, joint swelling and arthralgias.  Neurological: Negative for dizziness, tremors, numbness and headaches.  Psychiatric/Behavioral: Negative for sleep disturbance. The patient is not nervous/anxious.     Filed Vitals:   05/22/15 1818  BP: 110/70   There is no weight on file to calculate BMI.  Physical Exam  Constitutional: She is oriented to person, place, and time. She appears well-developed. No distress.  Sitting in w/c in NAD  HENT:  Mouth/Throat: Oropharynx is clear and moist. No oropharyngeal exudate.  Eyes: Pupils are equal, round, and reactive to light. No scleral icterus.  Neck: Neck supple. Carotid bruit is not present. No tracheal deviation present. No thyromegaly present.  Cardiovascular: Normal rate, regular rhythm and intact distal pulses.  Exam reveals no gallop and no friction rub.   Murmur (1/6 SEM) heard. No  LE edema b/l. no calf TTP.   Pulmonary/Chest: Effort normal. No stridor. No respiratory distress. She has no wheezes. She has rales (left expiratory).  Abdominal: Soft. Bowel sounds are normal. She exhibits no distension and no mass. There is no hepatomegaly. There is no tenderness. There is no rebound and no guarding.  Large ventral hernia, nonreducible but NT  Musculoskeletal: She exhibits edema and tenderness.  LUE sling intact with distal brace intact. FROM fingers  Lymphadenopathy:    She has no cervical adenopathy.  Neurological: She is alert and oriented to person, place, and time.  Skin: Skin is warm and dry. No rash noted.  Psychiatric: She has a normal mood and affect. Her behavior is normal. Thought content normal.     Labs reviewed: Admission on 05/14/2015, Discharged on 05/20/2015  Component Date Value Ref Range Status  . WBC 05/14/2015 9.7  4.0 - 10.5 K/uL Final  . RBC 05/14/2015 3.33* 3.87 - 5.11 MIL/uL Final  . Hemoglobin 05/14/2015 8.8* 12.0 - 15.0 g/dL Final  . HCT 05/14/2015 29.3* 36.0 - 46.0 %  Final  . MCV 05/14/2015 88.0  78.0 - 100.0 fL Final  . MCH 05/14/2015 26.4  26.0 - 34.0 pg Final  . MCHC 05/14/2015 30.0  30.0 - 36.0 g/dL Final  . RDW 05/14/2015 16.1* 11.5 - 15.5 % Final  . Platelets 05/14/2015 484* 150 - 400 K/uL Final  . Prothrombin Time 05/14/2015 15.0  11.6 - 15.2 seconds Final  . INR 05/14/2015 1.16  0.00 - 1.49 Final  . WBC 05/15/2015 8.8  4.0 - 10.5 K/uL Final  . RBC 05/15/2015 3.71* 3.87 - 5.11 MIL/uL Final  . Hemoglobin 05/15/2015 9.6* 12.0 - 15.0 g/dL Final  . HCT 05/15/2015 32.3* 36.0 - 46.0 % Final  . MCV 05/15/2015 87.1  78.0 - 100.0 fL Final  . MCH 05/15/2015 25.9* 26.0 - 34.0 pg Final  . MCHC 05/15/2015 29.7* 30.0 - 36.0 g/dL Final  . RDW 05/15/2015 16.4* 11.5 - 15.5 % Final  . Platelets 05/15/2015 465* 150 - 400 K/uL Final  . Sodium 05/15/2015 137  135 - 145 mmol/L Final  . Potassium 05/15/2015 4.2  3.5 - 5.1 mmol/L Final  . Chloride  05/15/2015 103  101 - 111 mmol/L Final  . CO2 05/15/2015 23  22 - 32 mmol/L Final  . Glucose, Bld 05/15/2015 84  65 - 99 mg/dL Final  . BUN 05/15/2015 11  6 - 20 mg/dL Final  . Creatinine, Ser 05/15/2015 0.84  0.44 - 1.00 mg/dL Final  . Calcium 05/15/2015 9.2  8.9 - 10.3 mg/dL Final  . GFR calc non Af Amer 05/15/2015 >60  >60 mL/min Final  . GFR calc Af Amer 05/15/2015 >60  >60 mL/min Final   Comment: (NOTE) The eGFR has been calculated using the CKD EPI equation. This calculation has not been validated in all clinical situations. eGFR's persistently <60 mL/min signify possible Chronic Kidney Disease.   . Anion gap 05/15/2015 11  5 - 15 Final  . pH, Ven 05/14/2015 7.360* 7.250 - 7.300 Final  . pCO2, Ven 05/14/2015 40.9* 45.0 - 50.0 mmHg Final  . pO2, Ven 05/14/2015 29.0* 30.0 - 45.0 mmHg Final  . Bicarbonate 05/14/2015 23.1  20.0 - 24.0 mEq/L Final  . TCO2 05/14/2015 24  0 - 100 mmol/L Final  . O2 Saturation 05/14/2015 53.0   Final  . Acid-base deficit 05/14/2015 2.0  0.0 - 2.0 mmol/L Final  . Sample type 05/14/2015 VENOUS   Final  . pH, Arterial 05/14/2015 7.399  7.350 - 7.450 Final  . pCO2 arterial 05/14/2015 34.2* 35.0 - 45.0 mmHg Final  . pO2, Arterial 05/14/2015 89.0  80.0 - 100.0 mmHg Final  . Bicarbonate 05/14/2015 21.1  20.0 - 24.0 mEq/L Final  . TCO2 05/14/2015 22  0 - 100 mmol/L Final  . O2 Saturation 05/14/2015 97.0   Final  . Acid-base deficit 05/14/2015 3.0* 0.0 - 2.0 mmol/L Final  . Sample type 05/14/2015 ARTERIAL   Final  . ABO/RH(D) 05/14/2015 O NEG   Final  . Antibody Screen 05/14/2015 NEG   Final  . Sample Expiration 05/14/2015 05/17/2015   Final  . Unit Number 05/14/2015 [id number]   Final  . Blood Component Type 05/14/2015 RBC LR PHER1   Final  . Unit division 05/14/2015 00   Final  . Status of Unit 05/14/2015 ISSUED,FINAL   Final  . Transfusion Status 05/14/2015 OK TO TRANSFUSE   Final  . Crossmatch Result 05/14/2015 Compatible   Final  . Unit Number  05/14/2015 [id number]   Final  . Blood Component Type  05/14/2015 RED CELLS,LR   Final  . Unit division 05/14/2015 00   Final  . Status of Unit 05/14/2015 REL FROM [facility]   Final  . Transfusion Status 05/14/2015 OK TO TRANSFUSE   Final  . Crossmatch Result 05/14/2015 Compatible   Final  . Unit Number 05/14/2015 [id number]   Final  . Blood Component Type 05/14/2015 RBC LR PHER1   Final  . Unit division 05/14/2015 00   Final  . Status of Unit 05/14/2015 ISSUED,FINAL   Final  . Transfusion Status 05/14/2015 OK TO TRANSFUSE   Final  . Crossmatch Result 05/14/2015 Compatible   Final  . Order Confirmation 05/14/2015 ORDER PROCESSED BY BLOOD BANK   Final  . MRSA, PCR 05/15/2015 NEGATIVE  NEGATIVE Final  . Staphylococcus aureus 05/15/2015 POSITIVE* NEGATIVE Final   Comment:        The Xpert SA Assay (FDA approved for NASAL specimens in patients over 2 years of age), is one component of a comprehensive surveillance program.  Test performance has been validated by [facility] for patients greater than or equal to 62 year old. It is not intended to diagnose infection nor to guide or monitor treatment.   . Order Confirmation 05/15/2015 ORDER PROCESSED BY BLOOD BANK   Final  . Sodium 05/19/2015 133* 135 - 145 mmol/L Final  . Potassium 05/19/2015 5.1  3.5 - 5.1 mmol/L Final  . Chloride 05/19/2015 98* 101 - 111 mmol/L Final  . CO2 05/19/2015 25  22 - 32 mmol/L Final  . Glucose, Bld 05/19/2015 111* 65 - 99 mg/dL Final  . BUN 05/19/2015 20  6 - 20 mg/dL Final  . Creatinine, Ser 05/19/2015 1.32* 0.44 - 1.00 mg/dL Final  . Calcium 05/19/2015 9.3  8.9 - 10.3 mg/dL Final  . GFR calc non Af Amer 05/19/2015 38* >60 mL/min Final  . GFR calc Af Amer 05/19/2015 44* >60 mL/min Final   Comment: (NOTE) The eGFR has been calculated using the CKD EPI equation. This calculation has not been validated in all clinical situations. eGFR's persistently <60 mL/min signify possible Chronic Kidney Disease.    . Anion gap 05/19/2015 10  5 - 15 Final  . WBC 05/19/2015 11.3* 4.0 - 10.5 K/uL Final  . RBC 05/19/2015 3.70* 3.87 - 5.11 MIL/uL Final  . Hemoglobin 05/19/2015 9.6* 12.0 - 15.0 g/dL Final  . HCT 05/19/2015 31.5* 36.0 - 46.0 % Final  . MCV 05/19/2015 85.1  78.0 - 100.0 fL Final  . MCH 05/19/2015 25.9* 26.0 - 34.0 pg Final  . MCHC 05/19/2015 30.5  30.0 - 36.0 g/dL Final  . RDW 05/19/2015 16.1* 11.5 - 15.5 % Final  . Platelets 05/19/2015 448* 150 - 400 K/uL Final  . Sodium 05/20/2015 135  135 - 145 mmol/L Final  . Potassium 05/20/2015 5.3* 3.5 - 5.1 mmol/L Final  . Chloride 05/20/2015 99* 101 - 111 mmol/L Final  . CO2 05/20/2015 28  22 - 32 mmol/L Final  . Glucose, Bld 05/20/2015 101* 65 - 99 mg/dL Final  . BUN 05/20/2015 19  6 - 20 mg/dL Final  . Creatinine, Ser 05/20/2015 1.20* 0.44 - 1.00 mg/dL Final  . Calcium 05/20/2015 9.6  8.9 - 10.3 mg/dL Final  . GFR calc non Af Amer 05/20/2015 42* >60 mL/min Final  . GFR calc Af Amer 05/20/2015 49* >60 mL/min Final   Comment: (NOTE) The eGFR has been calculated using the CKD EPI equation. This calculation has not been validated in all clinical situations. eGFR's persistently <60  mL/min signify possible Chronic Kidney Disease.   . Anion gap 05/20/2015 8  5 - 15 Final  Office Visit on 05/08/2015  Component Date Value Ref Range Status  . Sodium 05/08/2015 137  135 - 145 mEq/L Final  . Potassium 05/08/2015 3.6  3.5 - 5.1 mEq/L Final  . Chloride 05/08/2015 106  96 - 112 mEq/L Final  . CO2 05/08/2015 26  19 - 32 mEq/L Final  . Glucose, Bld 05/08/2015 95  70 - 99 mg/dL Final  . BUN 05/08/2015 14  6 - 23 mg/dL Final  . Creatinine, Ser 05/08/2015 0.81  0.40 - 1.20 mg/dL Final  . Calcium 05/08/2015 9.0  8.4 - 10.5 mg/dL Final  . GFR 05/08/2015 72.52  >60.00 mL/min Final  . Pro B Natriuretic peptide (BNP) 05/08/2015 469.0* 0.0 - 100.0 pg/mL Final  Admission on 04/26/2015, Discharged on 04/30/2015  Component Date Value Ref Range Status  . Sodium  04/26/2015 142  135 - 145 mmol/L Final  . Potassium 04/26/2015 3.0* 3.5 - 5.1 mmol/L Final  . Chloride 04/26/2015 104  101 - 111 mmol/L Final  . CO2 04/26/2015 27  22 - 32 mmol/L Final  . Glucose, Bld 04/26/2015 121* 65 - 99 mg/dL Final  . BUN 04/26/2015 15  6 - 20 mg/dL Final  . Creatinine, Ser 04/26/2015 0.75  0.44 - 1.00 mg/dL Final  . Calcium 04/26/2015 9.2  8.9 - 10.3 mg/dL Final  . GFR calc non Af Amer 04/26/2015 >60  >60 mL/min Final  . GFR calc Af Amer 04/26/2015 >60  >60 mL/min Final   Comment: (NOTE) The eGFR has been calculated using the CKD EPI equation. This calculation has not been validated in all clinical situations. eGFR's persistently <60 mL/min signify possible Chronic Kidney Disease.   . Anion gap 04/26/2015 11  5 - 15 Final   Performed at [facility]  . WBC 04/26/2015 11.9* 4.0 - 10.5 K/uL Final  . RBC 04/26/2015 3.61* 3.87 - 5.11 MIL/uL Final  . Hemoglobin 04/26/2015 9.7* 12.0 - 15.0 g/dL Final  . HCT 04/26/2015 32.2* 36.0 - 46.0 % Final  . MCV 04/26/2015 89.2  78.0 - 100.0 fL Final  . MCH 04/26/2015 26.9  26.0 - 34.0 pg Final  . MCHC 04/26/2015 30.1  30.0 - 36.0 g/dL Final  . RDW 04/26/2015 15.4  11.5 - 15.5 % Final  . Platelets 04/26/2015 343  150 - 400 K/uL Final  . MRSA, PCR 04/26/2015 NEGATIVE  NEGATIVE Final  . Staphylococcus aureus 04/26/2015 POSITIVE* NEGATIVE Final   Comment:        The Xpert SA Assay (FDA approved for NASAL specimens in patients over 57 years of age), is one component of a comprehensive surveillance program.  Test performance has been validated by [facility] for patients greater than or equal to 64 year old. It is not intended to diagnose infection nor to guide or monitor treatment.   . WBC 04/27/2015 7.9  4.0 - 10.5 K/uL Final  . RBC 04/27/2015 2.97* 3.87 - 5.11 MIL/uL Final  . Hemoglobin 04/27/2015 7.9* 12.0 - 15.0 g/dL Final  . HCT 04/27/2015 27.1* 36.0 - 46.0 % Final  . MCV 04/27/2015 91.2  78.0 - 100.0 fL  Final  . MCH 04/27/2015 26.6  26.0 - 34.0 pg Final  . MCHC 04/27/2015 29.2* 30.0 - 36.0 g/dL Final  . RDW 04/27/2015 15.8* 11.5 - 15.5 % Final  . Platelets 04/27/2015 260  150 - 400 K/uL Final  . Creatinine,  Ser 04/27/2015 0.83  0.44 - 1.00 mg/dL Final  . GFR calc non Af Amer 04/27/2015 >60  >60 mL/min Final  . GFR calc Af Amer 04/27/2015 >60  >60 mL/min Final   Comment: (NOTE) The eGFR has been calculated using the CKD EPI equation. This calculation has not been validated in all clinical situations. eGFR's persistently <60 mL/min signify possible Chronic Kidney Disease.   . Sodium 04/28/2015 141  135 - 145 mmol/L Final  . Potassium 04/28/2015 3.7  3.5 - 5.1 mmol/L Final   Comment: DELTA CHECK NOTED REPEATED TO VERIFY NO VISIBLE HEMOLYSIS   . Chloride 04/28/2015 106  101 - 111 mmol/L Final  . CO2 04/28/2015 27  22 - 32 mmol/L Final  . Glucose, Bld 04/28/2015 112* 65 - 99 mg/dL Final  . BUN 04/28/2015 17  6 - 20 mg/dL Final  . Creatinine, Ser 04/28/2015 0.94  0.44 - 1.00 mg/dL Final  . Calcium 04/28/2015 9.3  8.9 - 10.3 mg/dL Final  . GFR calc non Af Amer 04/28/2015 57* >60 mL/min Final  . GFR calc Af Amer 04/28/2015 >60  >60 mL/min Final   Comment: (NOTE) The eGFR has been calculated using the CKD EPI equation. This calculation has not been validated in all clinical situations. eGFR's persistently <60 mL/min signify possible Chronic Kidney Disease.   . Anion gap 04/28/2015 8  5 - 15 Final  . WBC 04/28/2015 10.1  4.0 - 10.5 K/uL Final  . RBC 04/28/2015 3.05* 3.87 - 5.11 MIL/uL Final  . Hemoglobin 04/28/2015 8.2* 12.0 - 15.0 g/dL Final  . HCT 04/28/2015 27.9* 36.0 - 46.0 % Final  . MCV 04/28/2015 91.5  78.0 - 100.0 fL Final  . MCH 04/28/2015 26.9  26.0 - 34.0 pg Final  . MCHC 04/28/2015 29.4* 30.0 - 36.0 g/dL Final  . RDW 04/28/2015 15.9* 11.5 - 15.5 % Final  . Platelets 04/28/2015 272  150 - 400 K/uL Final  . Sodium 04/29/2015 141  135 - 145 mmol/L Final  . Potassium  04/29/2015 3.7  3.5 - 5.1 mmol/L Final  . Chloride 04/29/2015 107  101 - 111 mmol/L Final  . CO2 04/29/2015 25  22 - 32 mmol/L Final  . Glucose, Bld 04/29/2015 114* 65 - 99 mg/dL Final  . BUN 04/29/2015 21* 6 - 20 mg/dL Final  . Creatinine, Ser 04/29/2015 0.95  0.44 - 1.00 mg/dL Final  . Calcium 04/29/2015 8.8* 8.9 - 10.3 mg/dL Final  . GFR calc non Af Amer 04/29/2015 56* >60 mL/min Final  . GFR calc Af Amer 04/29/2015 >60  >60 mL/min Final   Comment: (NOTE) The eGFR has been calculated using the CKD EPI equation. This calculation has not been validated in all clinical situations. eGFR's persistently <60 mL/min signify possible Chronic Kidney Disease.   . Anion gap 04/29/2015 9  5 - 15 Final  . B Natriuretic Peptide 04/29/2015 707.5* 0.0 - 100.0 pg/mL Final  . WBC 04/29/2015 7.0  4.0 - 10.5 K/uL Final  . RBC 04/29/2015 3.05* 3.87 - 5.11 MIL/uL Final  . Hemoglobin 04/29/2015 8.2* 12.0 - 15.0 g/dL Final  . HCT 04/29/2015 27.5* 36.0 - 46.0 % Final  . MCV 04/29/2015 90.2  78.0 - 100.0 fL Final  . MCH 04/29/2015 26.9  26.0 - 34.0 pg Final  . MCHC 04/29/2015 29.8* 30.0 - 36.0 g/dL Final  . RDW 04/29/2015 15.8* 11.5 - 15.5 % Final  . Platelets 04/29/2015 308  150 - 400 K/uL Final    Dg  Chest 1 View  05/19/2015   CLINICAL DATA:  Cough  EXAM: CHEST  1 VIEW  COMPARISON:  05/08/2015  FINDINGS: Cardiac shadow is again mildly enlarged. The lungs are well aerated bilaterally. Patchy infiltrate is noted in the right suprahilar region new from the prior exam. Changes of prior vertebral augmentation are seen. Stable blunting of the costophrenic angles is noted.  IMPRESSION: New right perihilar infiltrate.   Electronically Signed   By: Inez Catalina M.D.   On: 05/19/2015 16:08   Dg Chest 1 View  04/26/2015   CLINICAL DATA:  Patient slipped and fell in the bathroom at home, injuring the left shoulder. Initial encounter.  EXAM: CHEST  1 VIEW  COMPARISON:  01/08/2015 and earlier.  FINDINGS: Cardiac  silhouette upper normal in size, unchanged. Thoracic aorta atherosclerotic, unchanged. Right perihilar oval-shaped opacity, shown on prior CT to represent focal dilation of the intralobar pulmonary artery. Hilar and mediastinal contours otherwise unremarkable. Mildly prominent bronchovascular markings diffusely, unchanged. Minimal linear scarring in the left lung base, unchanged. No new pulmonary parenchymal abnormalities. No visible pleural effusions.  IMPRESSION: No acute cardiopulmonary disease.   Electronically Signed   By: Evangeline Dakin M.D.   On: 04/26/2015 12:56   Dg Chest 2 View  05/09/2015   CLINICAL DATA:  Acute on chronic CHF; abnormal chest exam, shortness of breath, history of COPD.  EXAM: CHEST  2 VIEW  COMPARISON:  Portable chest x-ray of Apr 29, 2015  FINDINGS: The lungs are mildly hyperinflated with hemidiaphragm flattening. There is subsegmental atelectasis in the lingula. Small amounts of pleural fluid blunt the costophrenic angles posteriorly. The cardiac silhouette is enlarged. The pulmonary vascularity is normal. There is prominent thoracic kyphosis. The patient has undergone kyphoplasty of 3 mid to lower thoracic levels.  IMPRESSION: COPD with tiny posterior pleural effusions. Stable mild cardiomegaly. Marked improvement in the appearance of the chest is consistent with resolution of pulmonary edema.   Electronically Signed   By: David  Martinique M.D.   On: 05/09/2015 07:50   Ct Shoulder Left Wo Contrast  04/26/2015   CLINICAL DATA:  Displaced left proximal humeral fracture.  EXAM: CT OF THE LEFT SHOULDER WITHOUT CONTRAST  TECHNIQUE: Multidetector CT imaging was performed according to the standard protocol. Multiplanar CT image reconstructions were also generated.  COMPARISON:  04/26/2015.  FINDINGS: Highly displaced surgical neck fracture of the left proximal humerus with 1.2 cm overlap, 3.1 cm anterior displacement of the distal fracture fragment, and 2.6 cm medial displacement of the  distal fracture fragment. Accordingly, the metaphysis and shaft are significantly anteromedial to the humeral head, in the fractured surfaces are not in direct contact. The humeral head continues to articulate with the glenoid in the expected fashion.  There is a fracture the greater tuberosity but this is nondisplaced. No lesser tuberosity fracture fragment. I do not observe a scapular fracture or an adjacent rib fracture. T5 compression fracture may well be chronic. T6, T8, and T9 thoracic compression fractures with vertebral augmentations.  Atherosclerotic aortic arch.  IMPRESSION: 1. Two part surgical neck fracture left proximal humerus with the metaphysis/shaft fragment displaced 3.1 cm anterior and 2.6 cm medially with respect to the femoral head, which continues to articulate with the glenoid. There is 1.2 cm of overlap and the fractured surfaces are not contiguous. Please note that although this is only at 2 part fracture, the current degree of displacement is striking. 2. There is a nondisplaced fracture the greater tuberosity. Because this is nondisplaced, it does  not make this fracture a 3 part fracture. 3. T5 compression fracture, probably chronic. Old T6, T8, and T9 thoracic compression fractures with vertebral augmentations.   Electronically Signed   By: Van Clines M.D.   On: 04/26/2015 16:32   Dg Chest Port 1 View  04/29/2015   CLINICAL DATA:  Patient with recent diagnosis of pneumonia.  EXAM: PORTABLE CHEST - 1 VIEW  COMPARISON:  Chest radiograph 04/26/2015; 01/08/2015  FINDINGS: Enlarged cardiac and mediastinal contours. Bilateral predominately perihilar interstitial pulmonary opacities. Layering bilateral pleural effusions and underlying pulmonary consolidation. Re- demonstrated comminuted fracture through the proximal left humerus.  IMPRESSION: Cardiomegaly with interstitial pulmonary opacities favored to represent edema.  Layering bilateral pleural effusions and underlying pulmonary  consolidation likely representing atelectasis.   Electronically Signed   By: Lovey Newcomer M.D.   On: 04/29/2015 11:41   Dg Shoulder Left  05/15/2015   CLINICAL DATA:  Fracture fixation  EXAM: DG C-ARM 61-120 MIN; LEFT SHOULDER - 2+ VIEW  COMPARISON:  none  FINDINGS: ORIF with metal plate and multiple screws in the left humerus and left proximal humeral shaft in satisfactory position and alignment. Fracture line not identified on the studies. There appears to be bone cement overlying the lateral humeral head.  IMPRESSION: Satisfactory fracture fixation.   Electronically Signed   By: Franchot Gallo M.D.   On: 05/15/2015 15:45   Dg Shoulder Left  04/27/2015   CLINICAL DATA:  Closed manipulation of left humerus dislocation.  EXAM: LEFT SHOULDER - 2+ VIEW; DG C-ARM 1-60 MIN - NRPT [facility]  COMPARISON:  Preoperative radiographs and CT.  FINDINGS: Two fluoroscopic spot images of the left shoulder during closed manipulation demonstrates improved alignment of the proximal humerus fracture. Fluoroscopy time reported 6 seconds.  IMPRESSION: Intraoperative fluoroscopy during close manipulation of left proximal humerus fracture, the fracture is in improved alignment.   Electronically Signed   By: Jeb Levering M.D.   On: 04/27/2015 03:29   Dg Shoulder Left  04/26/2015   CLINICAL DATA:  Patient status post fall with left shoulder pain. History of cancer.  EXAM: LEFT SHOULDER - 2+ VIEW  COMPARISON:  Chest radiograph 04/26/2015; 07/25/2014  FINDINGS: There is a markedly comminuted fracture of the proximal left humerus involving the left humeral head. The humeral head appears to be located on limited views however the proximal left humeral diaphysis appears to be medially dislocated. There is contour abnormality of the mid aspect of the left clavicle which is likely secondary to old healed clavicle fracture. Kyphoplasty material within the visualized thoracic spine. Multiple soft tissue calcifications.  IMPRESSION: Comminuted  proximal left humerus fracture. The proximal humeral diaphysis appears to be dislocated medially to the humeral head.  Likely chronic deformity of the mid body of the left clavicle, potentially secondary to old healed fracture. Recommend correlation with dedicated clavicle views.   Electronically Signed   By: Lovey Newcomer M.D.   On: 04/26/2015 12:53   Dg Shoulder Left Port  05/15/2015   CLINICAL DATA:  81 year old female with a history of ORIF left shoulder  EXAM: LEFT SHOULDER - 1 VIEW  COMPARISON:  Intraoperative images 6 08/2015, plain film 04/27/2015  FINDINGS: Compared to the plain film 04/27/2015 there have been interval changes of open reduction internal fixation of left humeral fracture. Anatomic alignment is maintained. Glenohumeral joint appears congruent.  Multiple clips at the mediastinum, with calcifications of the aortic arch again noted.  Changes of prior vertebroplasty.  IMPRESSION: Interval changes of open reduction internal  fixation of left humerus fracture, with anatomic alignment maintained.  Signed,  Dulcy Fanny. Earleen Newport, DO  [facility]  [facility]   Electronically Signed   By: Corrie Mckusick D.O.   On: 05/15/2015 16:18   Dg Shoulder Left Port  04/27/2015   CLINICAL DATA:  Proximal left humerus fracture postreduction.  EXAM: LEFT SHOULDER - 1 VIEW  COMPARISON:  Pre reduction views 1 day prior.  FINDINGS: Improved alignment of the comminuted displaced proximal humerus fracture with persistent but improved displacement of the humeral diaphysis. Glenohumeral alignment is suboptimally assessed, however humeral head is likely normally located. Soft tissue calcifications are again seen.  IMPRESSION: Improved alignment of comminuted displaced proximal humerus fracture. The glenohumeral alignment is suboptimally assessed, however humeral head is likely normally located.   Electronically Signed   By: Jeb Levering M.D.   On: 04/27/2015 02:18   Dg Hand  Complete Left  04/26/2015   CLINICAL DATA:  Left hand pain after fall today. History of arthritis.  EXAM: LEFT HAND - COMPLETE 3+ VIEW  COMPARISON:  None.  FINDINGS: Advanced degenerative changes in the first carpometacarpal joint and IP joints diffusely. Joint space narrowing in the in the CP joints. There is a fracture through the proximal phalanx of the left thumb with mild angulation. No additional fracture noted.  IMPRESSION: Fracture through the base of the left thumb proximal phalanx with mild angulation.   Electronically Signed   By: Rolm Baptise M.D.   On: 04/26/2015 18:19   Dg C-arm 1-60 Min-no Report  04/27/2015   CLINICAL DATA:  Closed manipulation of left humerus dislocation.  EXAM: LEFT SHOULDER - 2+ VIEW; DG C-ARM 1-60 MIN - NRPT [facility]  COMPARISON:  Preoperative radiographs and CT.  FINDINGS: Two fluoroscopic spot images of the left shoulder during closed manipulation demonstrates improved alignment of the proximal humerus fracture. Fluoroscopy time reported 6 seconds.  IMPRESSION: Intraoperative fluoroscopy during close manipulation of left proximal humerus fracture, the fracture is in improved alignment.   Electronically Signed   By: Jeb Levering M.D.   On: 04/27/2015 03:29   Dg C-arm 61-120 Min  05/15/2015   CLINICAL DATA:  Fracture fixation  EXAM: DG C-ARM 61-120 MIN; LEFT SHOULDER - 2+ VIEW  COMPARISON:  none  FINDINGS: ORIF with metal plate and multiple screws in the left humerus and left proximal humeral shaft in satisfactory position and alignment. Fracture line not identified on the studies. There appears to be bone cement overlying the lateral humeral head.  IMPRESSION: Satisfactory fracture fixation.   Electronically Signed   By: Franchot Gallo M.D.   On: 05/15/2015 15:45     Assessment/Plan   ICD-9-CM ICD-10-CM   1. S/P ORIF (open reduction internal fixation) fracture V45.89 Z96.7    V15.51 Z87.81    OF PROXIMAL LEFT HUMERUS FX  2. Coronary artery disease involving native  coronary artery of native heart with angina pectoris - being tx medically 414.01 I25.10   3. COPD with acute exacerbation ([place]) - improved 491.21 J44.1   4. Diastolic CHF, acute on chronic ([facility]) - improved 428.33 I50.33    428.0    5. Essential hypertension stable 401.9 I10   6. Chronic constipation - stable 564.00 K59.00   7. GERD without esophagitis - stable 530.81 K21.9   8. 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 Pernicious anemia - stable 281.0 D51.0     F/u with ortho and oncology as scheduled  Cont PT/OT as ordered  Pain control  Cont current meds as ordered  Nutritional supplements as ordered  GOAL: short term rehab and d/c home when medically appropriate. Communicated with pt and nursing.  Will follow  Shanquita Ronning S. Perlie Gold  [facility] [address] [place], [place] [address] [phone] Cell (Monday-Friday 8 AM - 5 PM) [phone] After 5 PM and follow prompts

## 2015-05-26 ENCOUNTER — Telehealth: Payer: Self-pay | Admitting: Cardiology

## 2015-05-26 ENCOUNTER — Encounter (HOSPITAL_COMMUNITY): Payer: Self-pay | Admitting: Orthopedic Surgery

## 2015-05-26 NOTE — Telephone Encounter (Signed)
Patient's son called to verify appointment time tomorrow. Patient concerned his mom's OV is not with Dr. Radford Pax because he has not met her.  Informed patient that Dr. Radford Pax will try to meet him tomorrow after his visit with [facility].

## 2015-05-26 NOTE — Telephone Encounter (Signed)
Follow Up    Pts son is following up on call from earlier. Please call.

## 2015-05-26 NOTE — Progress Notes (Signed)
Cardiology Office Note   Date:  05/27/2015   ID:  [name], DOB 1934-02-16, MRN [id number]  PCP:  Martinique, [name], MD  Cardiologist:  Dr. Fransico Him     Chief Complaint  Patient presents with  . Coronary Artery Disease  . Hospitalization Follow-up     History of Present Illness: [name] is a 81 y.o. female with a hx of HTN, diastolic HF, GERD, pernicious anemia, prior GI bleed, esophageal adenocarcinoma, COPD, remote history of tobacco abuse. She was evaluated by Dr. Radford Pitts 05/08/15 for surgical clearance for ORIF of humeral fracture. She had already had an initial surgery but needed more extensive surgery. She complained of rapid palpitations associated with neck and left arm discomfort. She had a recent BNP was elevated at 707 and chest x-ray demonstrating pulmonary edema. She was noted to have coronary calcifications on CT scan from 2015. Her symptoms were concerning for anginal equivalent. She was originally set up for cardiac CTA to further evaluate. However she had difficulty tolerating this and was referred for cardiac catheterization. She was also placed on an event monitor to evaluate her arrhythmia.  Admitted 6/8-6/14.  Cardiac catheterization showed severe multivessel CAD including significant ost LM and mid RCA disease which would be best approached by bypass surgery.  However she is a poor candidate for CABG given her advanced age, severe COPD, chronic steroid-induced use and poor mobility. High risk PCI could be considered, however cardiac catheterization showed severe left main disease that is very difficult to approach by PCI given ostial location. Echo demonstrated EF 62-86%, grade 2 diastolic dysfunction, moderate aortic stenosis with mean gradient 20 mmHg, moderately dilated LA and PASP 46 mmHg.  She was seen by Dr. Roxan Hockey of cardiothoracic surgery team and it was felt that her risk associated with surgery is prohibitive. After review by interventional  cardiology, med Rx was recommended.   She underwent open reduction and internal fixation of left proximal humerus fracture on 05/15/2015 despite high risk. She had no chest pain.  She did have some SVT however controlled on atenolol.  If she becomes symptomatic, would have very low threshold to potentially do PCI of RCA, although L main would continue to pose a problem.  She did have increased cough and was given an increased dose of Lasix prior to DC.   She is currently staying at [facility]. She was placed on Levaquin. Notes indicate that this was started for COPD exacerbation. The patient tells me she also has a UTI. Since starting the medication she's been nauseated. She is weak and dizzy and does note some near syncope. She has not been able to urinate for the last 24 hours.  She denies frank syncope. She's been sleeping in a chair for years since her esophageal CA was diagnosed. She denies PND or LE edema. She has chronic left-sided chest discomfort. This occurs at any time. She describes it as a pressure. She does not necessarily note it with activities. She tells me that she has been having this for years. Her cough is productive of clear sputum. She denies hemoptysis or purulent sputum production. She continues to have dysuria despite the initiation of Levaquin. She denies fever.   Studies/Reports Reviewed Today:  LHC 05/14/15  Prox RCA to Mid RCA lesion, 99% stenosed.  Dist RCA lesion, 40% stenosed.  Ost RPDA lesion, 80% stenosed.  Ost LM to LM lesion, 75% stenosed. 1. Severe triple vessel CAD involving the ostial left main artery  and mid RCA 2. Moderately severe stenosis ostial left main artery 3. Severe stenosis mid RCA 4. Mild LV systolic dysfunction 5. Pulmonary HTN Recommendations: This a complex situation. She is an elderly female with left arm fracture with need for surgical correction. She has no angina but is found to have severe multi-vessel CAD. Her CAD would be best  approached with bypass surgery but she is a poor candidate for CABG given advanced age, severe COPD, chronic steroid use and poor mobility. She is also a poor candidate for stenting given the location of the left main disease and history of chronic anemia. Her RCA could be easily treated with PCI. The moderately severe left main stenosis would be more difficult to treat with PCI.  I will admit to telemetry. I see three options here. The first option would be CABG so I will ask CT surgery to see her but I do not think she is a good candidate for an open chest procedure because of reasons outlined above. If CT surgery does not feel that she would be a surgical candidate, would have to consider PCI of the RCA with medical management of the left main lesion. We could also consider palliation with her overall poor functional status and poor prognosis. Will continue home meds. Will not start Plavix until we have a better disposition.   Echo 05/12/15 - EF 40% to 45%. Milddiffuse hypokinesis with no identifiable regional variations. Grade 2 diastolic dysfunction. - Aortic valve: There was moderate stenosis. There was trivialregurgitation. Mean gradient (S): 20 mm Hg. Peak gradient (S): 39 mm Hg. - Mitral valve: Moderately to severely fibrotic annulus. Mildlythickened leaflets . There was mild regurgitation directedcentrally. Valve area by continuity equation (using LVOT flow):1.67 cm^2. - Left atrium: The atrium was moderately dilated. - Pulmonary arteries: Systolic pressure was mildly increased. PApeak pressure: 46 mm Hg (S).  Myoview 11/2011 Normal stress nuclear study  Past Medical History  Diagnosis Date  . Fibromyalgia   . Skin lesion     chronic calcific cutaneous lesions  . HX of multiple bleeding ulcers 09/01/2011  . Hypertension     takes Atenolol daily  . Emphysema   . Dizziness     d/t crystals in ears that "roll around"and can't get them out  . Osteoporosis   . Scoliosis   .  Psoriasis   . H/O hiatal hernia   . GERD (gastroesophageal reflux disease)     takes Aciphex daily  . Gastric ulcer   . Constipation     since Chemo and Radiation;finished up in Nov 2012  . Diverticulosis   . History of colonic polyps   . Shingles     broke out 2wks ago;has been on meds and to finish up tomorrow  . Vitamin B deficiency   . COPD (chronic obstructive pulmonary disease)   . On home oxygen therapy     "2L;  24/7" (05/14/2015)  . Left humeral fracture 04/26/2015  . Scoliosis   . Complication of anesthesia     shortness of breath and pain with gas   . CHF (congestive heart failure)   . Exertional shortness of breath   . Pneumonia 2015 X 3  . Chronic bronchitis     "I about keep it"  . History of blood transfusion     "related to my anemia"  . Osteoarthritis   . Arthritis     "qwhere" (05/14/2015)  . Chronic upper back pain   . Kidney stone 25+yrs ago    "  passed them"  . Adenocarcinoma of gastroesophageal junction   . Pernicious anemia   . Iron deficiency anemia   . Anemia, B12 deficiency     "get shots once/month" (05/14/2015)    Past Surgical History  Procedure Laterality Date  . Total hip arthroplasty Right 2001  . Tonsillectomy      as a child  . Tubal ligation  1975  . Appendectomy  1975  . Cataract extraction w/ intraocular lens  implant, bilateral Bilateral 10+yrs ago  . Esophagogastroduodenoscopy    . Colonoscopy    . Partial esophagectomy  12/20/2011    Procedure: ESOPHAGECTOMY PARTIAL;  Surgeon: Pierre Bali, MD;  Location: [facility];  Service: Thoracic;  Laterality: N/A;  . Total hip arthroplasty  06/30/2012    Procedure: TOTAL HIP ARTHROPLASTY ANTERIOR APPROACH;  Surgeon: Mcarthur Rossetti, MD;  Location: [facility];  Service: Orthopedics;  Laterality: Left;  Left total hip arthroplasty  . Shoulder closed reduction  04/26/2015    Procedure: CLOSED MANIPULATION SHOULDER;  Surgeon: Meredith Pel, MD;  Location: [facility];  Service: Orthopedics;;  .  Cardiac catheterization N/A 05/14/2015    Procedure: Right/Left Heart Cath and Coronary Angiography;  Surgeon: Burnell Blanks, MD;  Location: [place] [facility];  Service: Cardiovascular;  Laterality: N/A;  . Joint replacement    . Dilation and curettage of uterus    . Back surgery    . Fixation kyphoplasty thoracic spine  X 3    "broke my back in 6 places related to fall/coughing; 3 different cases"  . Orif humerus fracture Left 05/15/2015    Procedure: Left OPEN REDUCTION INTERNAL FIXATION (ORIF) PROXIMAL HUMERUS FRACTURE;  Surgeon: Meredith Pel, MD;  Location: [facility];  Service: Orthopedics;  Laterality: Left;     Current Outpatient Prescriptions  Medication Sig Dispense Refill  . acetaminophen (TYLENOL) 500 MG tablet Take 2 tablets (1,000 mg total) by mouth 3 (three) times daily. 30 tablet 0  . albuterol (PROVENTIL HFA;VENTOLIN HFA) 108 (90 BASE) MCG/ACT inhaler Inhale 2 puffs into the lungs every 4 (four) hours as needed for wheezing (wheezing).    Marland Kitchen albuterol (PROVENTIL) (2.5 MG/3ML) 0.083% nebulizer solution Take 3 mLs (2.5 mg total) by nebulization every 2 (two) hours as needed for wheezing or shortness of breath. 75 mL 12  . aspirin EC 81 MG tablet Take 81 mg by mouth every morning.     Marland Kitchen atenolol (TENORMIN) 50 MG tablet Take 50 mg by mouth 2 (two) times daily.    Marland Kitchen atorvastatin (LIPITOR) 80 MG tablet Take 1 tablet (80 mg total) by mouth daily at 6 PM. 30 tablet 11  . clopidogrel (PLAVIX) 75 MG tablet Take 1 tablet (75 mg total) by mouth daily. 30 tablet 11  . cyanocobalamin (,VITAMIN B-12,) 1000 MCG/ML injection Inject 1,000 mcg into the muscle every 6 (six) weeks.    . ergocalciferol (VITAMIN D2) 50000 UNITS capsule Take 50,000 Units by mouth every 14 (fourteen) days.     Marland Kitchen esomeprazole (NEXIUM) 20 MG capsule Take 20 mg by mouth daily at 12 noon.    . Fluticasone Furoate-Vilanterol 200-25 MCG/INH AEPB Inhale 2 puffs into the lungs every morning.    . furosemide (LASIX) 40  MG tablet Take 1 tablet (40 mg total) by mouth every morning. 30 tablet 6  . guaiFENesin (MUCINEX) 600 MG 12 hr tablet Take 1 tablet (600 mg total) by mouth 2 (two) times daily.    . hydroxypropyl methylcellulose (ISOPTO TEARS) 2.5 %  ophthalmic solution Place 1 drop into both eyes 3 (three) times daily as needed for dry eyes.    Marland Kitchen ipratropium-albuterol (DUONEB) 0.5-2.5 (3) MG/3ML SOLN Take 3 mLs by nebulization every 6 (six) hours. 360 mL   . Lactobacillus Rhamnosus, GG, (CULTURELLE) CAPS Take 1 capsule by mouth every morning.    Marland Kitchen levofloxacin (LEVAQUIN) 500 MG tablet Take 500 mg by mouth 2 (two) times daily.    Marland Kitchen lisinopril (PRINIVIL,ZESTRIL) 40 MG tablet Take 20 mg by mouth every evening.    . loratadine (CLARITIN) 10 MG tablet Take 10 mg by mouth every morning.     Marland Kitchen LORazepam (ATIVAN) 0.5 MG tablet Take 1 tablet (0.5 mg total) by mouth every 8 (eight) hours as needed for anxiety. 14 tablet 0  . nitroGLYCERIN (NITROSTAT) 0.4 MG SL tablet Place 1 tablet (0.4 mg total) under the tongue every 5 (five) minutes as needed for chest pain. 25 tablet 3  . polyethylene glycol (MIRALAX / GLYCOLAX) packet Take 17 Pitts by mouth daily. 14 each 0  . potassium chloride SA (K-DUR,KLOR-CON) 20 MEQ tablet Take 1 tablet (20 mEq total) by mouth daily. 30 tablet 6  . predniSONE (DELTASONE) 1 MG tablet Take 2 mg by mouth daily with breakfast.    . saccharomyces boulardii (FLORASTOR) 250 MG capsule Take 1 capsule (250 mg total) by mouth 2 (two) times daily. 30 capsule 0  . senna (SENOKOT) 8.6 MG TABS tablet Take 1 tablet (8.6 mg total) by mouth at bedtime. 120 each 0  . sodium chloride (OCEAN) 0.65 % SOLN nasal spray Place 1 spray into both nostrils as needed for congestion. 15 mL 0  . ST JOHNS WORT PO Take 1 capsule by mouth 2 (two) times daily.    Marland Kitchen tiZANidine (ZANAFLEX) 4 MG tablet Take 0.5-1 tablets (2-4 mg total) by mouth every 8 (eight) hours as needed for muscle spasms. 30 tablet 0   No current  facility-administered medications for this visit.    Allergies:   Keflex; Nulecit; Biaxin; Fentanyl; Spiriva handihaler; Tramadol; Adhesive; Clarithromycin; Darvon; Epinephrine; Morphine and related; Oxycodone; Sulfa antibiotics; and Vicodin    Social History:  The patient  reports that she quit smoking about 23 years ago. Her smoking use included Cigarettes. She has a 135 pack-year smoking history. She has never used smokeless tobacco. She reports that she does not drink alcohol or use illicit drugs.   Family History:  The patient's family history includes Breast cancer in her paternal aunt, paternal aunt, and paternal aunt; Cancer in her maternal aunt and maternal grandmother. There is no history of Anesthesia problems, Hypotension, Malignant hyperthermia, or Pseudochol deficiency.    ROS:   Please see the history of present illness.   Review of Systems  Constitution: Positive for decreased appetite, diaphoresis and malaise/fatigue.  HENT: Positive for hearing loss.   Cardiovascular: Positive for chest pain, dyspnea on exertion, irregular heartbeat and orthopnea.  Respiratory: Positive for cough and wheezing.   Hematologic/Lymphatic: Bruises/bleeds easily.  Musculoskeletal: Positive for joint pain and myalgias.  Gastrointestinal: Positive for nausea and vomiting.  Neurological: Positive for dizziness and loss of balance.  Psychiatric/Behavioral: Positive for depression. The patient is nervous/anxious.   All other systems reviewed and are negative.     PHYSICAL EXAM: VS:  BP 90/60 mmHg  Pulse 96  Ht 5\' 3"  (1.6 m)  Wt 136 lb (61.689 kg)  BMI 24.10 kg/m2  SpO2 97%    Wt Readings from Last 3 Encounters:  05/27/15 136 lb (61.689  kg)  05/21/15 136 lb (61.689 kg)  05/15/15 136 lb 11 oz (62 kg)     GEN: chronically ill appearing female in a wheelchair with O2, in no acute distress HEENT: normal Neck: no JVD at 90 degrees, no masses Cardiac:  distant J0/D3, RRR; 2/6 systolic  murmur RUBS,  no rubs or gallops, no edema , right wrist without hematoma or mass  Respiratory:  Decreased breath sounds, diffuse bilateral rhonchi GI: soft, nontender  MS: no deformity or atrophy Skin: warm and dry  Neuro:  CNs II-XII intact, Strength and sensation are intact Psych: Normal affect   EKG:  EKG is ordered today.  It demonstrates:   NSR, HR 96, leftward axis, Q waves in V1-V3, nonspecific ST-T wave changes, no change from prior tracing   Recent Labs: 07/14/2014: ALT <5 04/29/2015: B Natriuretic Peptide 707.5* 05/08/2015: Pro B Natriuretic peptide (BNP) 469.0* 05/19/2015: Hemoglobin 9.6*; Platelets 448* 05/20/2015: BUN 19; Creatinine, Ser 1.20*; Potassium 5.3*; Sodium 135    Lipid Panel No results found for: CHOL, TRIG, HDL, CHOLHDL, VLDL, LDLCALC, LDLDIRECT    ASSESSMENT AND PLAN:  Coronary artery disease:  Patient was noted to have severe 3 vessel CAD with ostial left main stenosis and critical mid RCA lesion. EF is 40-45% on echocardiogram with moderate aortic stenosis (mean gradient 20 mmHg). As noted, she was not felt to be an operative candidate and turned down for CABG. Records from the hospital have been extensively reviewed. It appears that she could be a candidate for high risk PCI of the RCA. However, left main disease would continue to be an issue. She made it through her arm surgery without significant issues. However, since discharge back to the nursing home, she has been placed on antibiotics for COPD exacerbation and UTI. She is having significant side effects to the Levaquin that she is currently taking. I spoke to the attending provider at the nursing home today Bard Herbert, NP). See discussion below.  The patient's blood pressure is soft. I suspect hypotension is likely contributing to some of her symptoms. I will decrease the dose of her ACE inhibitor and stop her amlodipine. Continue current dose of beta blocker, aspirin, Plavix, statin. Chest discomfort is a  chronic symptom. She does not note any significant worsening or change since prior to her recent workup. She seems to describe these symptoms for a period of years without significant change.  Aortic stenosis:  Moderate by recent echocardiogram. As noted, she is not an operative candidate. Given her severe CAD, I'm not sure that she would be a candidate for TAVR as well.  Cardiomyopathy:  EF 40-45% by recent echocardiogram. Continue beta blocker, ACE inhibitor (at reduced dose).  Chronic Systolic and Diastolic CHF:  Respiratory exam is abnormal. However, I suspect a lot of this is related to her COPD. Overall she appears to be stable from a volume standpoint. Continue current dose of Lasix. Check follow-up BMET.  Essential hypertension:  Blood pressure running low. Decrease lisinopril to 20 mg daily. Stop amlodipine.  Adenocarcinoma of gastroesophageal junction:  She has been sleeping sitting up in a chair since she was diagnosed with CA.  Chronic obstructive pulmonary disease, unspecified COPD, unspecified chronic bronchitis type:  She has a chronic cough related to COPD. Respiratory exam is likely chronic related to her COPD. She's currently on antibiotics therapy. Antibiotics are being managed by her attending team at the nursing home. She may need a repeat chest x-ray at some point. I will leave this up  to the expertise of Ms. Nyoka Cowden, NP.  Paroxysmal SVT (supraventricular tachycardia):  SVT noted in the hospital on telemetry. She is on beta blocker therapy. She was to have an event monitor when initially seen by Dr. Radford Pitts. I will review this further with Dr. Radford Pitts to see if we still need to pursue this study.   Urinary Retention:  Patient is currently being treated for UTI per her report. She has not been able to urinate in the last 24 hours. I spoke with the nurse practitioner at the nursing home. She will reassess the patient when she returns to the facility today. A BMET and CBC will be  obtained. Further workup will be per Ms. Nyoka Cowden, NP.   Medication Changes: Current medicines are reviewed at length with the patient today.  Concerns regarding medicines are as outlined above.  The following changes have been made:   Discontinued Medications   AMLODIPINE (NORVASC) 2.5 MG TABLET    Take 2.5 mg by mouth every morning.    Modified Medications   No medications on file   New Prescriptions   No medications on file    Labs/ tests ordered today include:   Orders Placed This Encounter  Procedures  . Basic Metabolic Panel (BMET)  . CBC w/Diff  . EKG 12-Lead     Disposition:   FU with Dr. Fransico Him 2-3 weeks.    Signed, Versie Starks, MHS 05/27/2015 3:05 PM    [facility] [place], [place], [place]  [address] Phone: [phone]; Fax: [phone]

## 2015-05-26 NOTE — Telephone Encounter (Signed)
New Message  Pt son calling to spaek w/ RN- concerning bypass. Please call back and discuss.

## 2015-05-27 ENCOUNTER — Encounter: Payer: Medicare Other | Admitting: Physician Assistant

## 2015-05-27 ENCOUNTER — Ambulatory Visit (INDEPENDENT_AMBULATORY_CARE_PROVIDER_SITE_OTHER): Payer: Medicare Other | Admitting: Physician Assistant

## 2015-05-27 ENCOUNTER — Non-Acute Institutional Stay (SKILLED_NURSING_FACILITY): Payer: Medicare Other | Admitting: Adult Health

## 2015-05-27 ENCOUNTER — Encounter: Payer: Self-pay | Admitting: Physician Assistant

## 2015-05-27 ENCOUNTER — Encounter: Payer: Self-pay | Admitting: Cardiology

## 2015-05-27 VITALS — BP 90/60 | HR 96 | Ht 63.0 in | Wt 136.0 lb

## 2015-05-27 DIAGNOSIS — I2510 Atherosclerotic heart disease of native coronary artery without angina pectoris: Secondary | ICD-10-CM | POA: Diagnosis not present

## 2015-05-27 DIAGNOSIS — N179 Acute kidney failure, unspecified: Secondary | ICD-10-CM | POA: Diagnosis not present

## 2015-05-27 DIAGNOSIS — E871 Hypo-osmolality and hyponatremia: Secondary | ICD-10-CM | POA: Insufficient documentation

## 2015-05-27 DIAGNOSIS — J069 Acute upper respiratory infection, unspecified: Secondary | ICD-10-CM | POA: Insufficient documentation

## 2015-05-27 DIAGNOSIS — R9431 Abnormal electrocardiogram [ECG] [EKG]: Secondary | ICD-10-CM | POA: Diagnosis not present

## 2015-05-27 DIAGNOSIS — C160 Malignant neoplasm of cardia: Secondary | ICD-10-CM

## 2015-05-27 DIAGNOSIS — I471 Supraventricular tachycardia: Secondary | ICD-10-CM

## 2015-05-27 DIAGNOSIS — N390 Urinary tract infection, site not specified: Secondary | ICD-10-CM | POA: Diagnosis not present

## 2015-05-27 DIAGNOSIS — J449 Chronic obstructive pulmonary disease, unspecified: Secondary | ICD-10-CM | POA: Diagnosis not present

## 2015-05-27 DIAGNOSIS — I429 Cardiomyopathy, unspecified: Secondary | ICD-10-CM | POA: Diagnosis not present

## 2015-05-27 DIAGNOSIS — I350 Nonrheumatic aortic (valve) stenosis: Secondary | ICD-10-CM | POA: Insufficient documentation

## 2015-05-27 DIAGNOSIS — D72829 Elevated white blood cell count, unspecified: Secondary | ICD-10-CM | POA: Diagnosis not present

## 2015-05-27 DIAGNOSIS — I10 Essential (primary) hypertension: Secondary | ICD-10-CM

## 2015-05-27 LAB — CBC WITH DIFFERENTIAL/PLATELET
BASOS PCT: 0.2 % (ref 0.0–3.0)
Basophils Absolute: 0 10*3/uL (ref 0.0–0.1)
EOS PCT: 0.3 % (ref 0.0–5.0)
Eosinophils Absolute: 0 10*3/uL (ref 0.0–0.7)
HEMATOCRIT: 30.8 % — AB (ref 36.0–46.0)
Hemoglobin: 9.9 g/dL — ABNORMAL LOW (ref 12.0–15.0)
LYMPHS ABS: 0.8 10*3/uL (ref 0.7–4.0)
Lymphocytes Relative: 4.2 % — ABNORMAL LOW (ref 12.0–46.0)
MCHC: 32.2 g/dL (ref 30.0–36.0)
MCV: 81 fl (ref 78.0–100.0)
Monocytes Absolute: 1.1 10*3/uL — ABNORMAL HIGH (ref 0.1–1.0)
Monocytes Relative: 6.2 % (ref 3.0–12.0)
Neutro Abs: 15.9 10*3/uL — ABNORMAL HIGH (ref 1.4–7.7)
Neutrophils Relative %: 89.1 % — ABNORMAL HIGH (ref 43.0–77.0)
PLATELETS: 475 10*3/uL — AB (ref 150.0–400.0)
RBC: 3.81 Mil/uL — AB (ref 3.87–5.11)
RDW: 17.3 % — ABNORMAL HIGH (ref 11.5–15.5)
WBC: 17.8 10*3/uL — AB (ref 4.0–10.5)

## 2015-05-27 LAB — BASIC METABOLIC PANEL
BUN: 38 mg/dL — AB (ref 6–23)
CO2: 20 mEq/L (ref 19–32)
CREATININE: 1.37 mg/dL — AB (ref 0.40–1.20)
Calcium: 9 mg/dL (ref 8.4–10.5)
Chloride: 95 mEq/L — ABNORMAL LOW (ref 96–112)
GFR: 39.54 mL/min — ABNORMAL LOW (ref >60.00)
Glucose, Bld: 95 mg/dL (ref 70–99)
Potassium: 4.5 mEq/L (ref 3.5–5.1)
Sodium: 125 mEq/L — ABNORMAL LOW (ref 135–145)

## 2015-05-27 NOTE — Patient Instructions (Signed)
Medication Instructions:  1. STOP NORVASC  2. DECREASE LISINOPRIL TO 20 MG DAILY  Labwork: TODAY BMET, CBC W/DIFF  Testing/Procedures: NONE  Follow-Up: DR. Radford Pax IN 2-3 WEEKS  Any Other Special Instructions Will Be Listed Below (If Applicable). 1. CALL IF BLOOD PRESSURE IS ABOVE 140/90 CONSISTENTLY  2. FOLLOW UP WITH DEBBIE GREEN, NP TODAY FOR NAUSEA FROM LEVAQUIN AND URINARY RETENTION

## 2015-05-27 NOTE — Progress Notes (Signed)
Patient ID: [name], female   DOB: 03-Apr-1934, 81 y.o.   MRN: [id number]  [facility]     Allergies  Allergen Reactions  . Keflex [Cephalexin] Itching    "severe itching"  . Nulecit [Na Ferric Gluc Cplx In Sucrose] Diarrhea, Nausea Only and Other (See Comments)      drop in BP  . Biaxin [Clarithromycin] Itching  . Fentanyl Itching  . Spiriva Handihaler [Tiotropium Bromide Monohydrate] Other (See Comments)    constipation  . Tramadol Itching    Skin burning and itching  . Adhesive [Tape] Itching  . Clarithromycin Itching  . Darvon Itching  . Epinephrine Itching  . Morphine And Related Itching  . Oxycodone Itching  . Sulfa Antibiotics Itching  . Vicodin [Hydrocodone-Acetaminophen] Itching    Can take with Benadryl       Chief Complaint  Patient presents with  . Acute Visit    patient status     HPI:  She was seen in cardiology today. She told the provider that she has been unable to urinate for the past 24 hours. She is telling me that she is having a great of difficulty voiding; has abdominal pain painful urination. She is having nausea; vomiting and constipation present. She is presently on levaquin through 05-30-15 for upper respiratory infection due to cough and congestion. She feels as though she has an uti. There are no reports of fever present. Her WBC is more elevated at 17.8 with elevated neutrophils.   Past Medical History  Diagnosis Date  . Fibromyalgia   . Skin lesion     chronic calcific cutaneous lesions  . HX of multiple bleeding ulcers 09/01/2011  . Hypertension     takes Atenolol daily  . Emphysema   . Dizziness     d/t crystals in ears that "roll around"and can't get them out  . Osteoporosis   . Scoliosis   . Psoriasis   . H/O hiatal hernia   . GERD (gastroesophageal reflux disease)     takes Aciphex daily  . Gastric ulcer   . Constipation     since Chemo and Radiation;finished up in Nov 2012  . Diverticulosis   . History of colonic  polyps   . Shingles     broke out 2wks ago;has been on meds and to finish up tomorrow  . Vitamin B deficiency   . COPD (chronic obstructive pulmonary disease)   . On home oxygen therapy     "2L;  24/7" (05/14/2015)  . Left humeral fracture 04/26/2015  . Scoliosis   . Complication of anesthesia     shortness of breath and pain with gas   . CHF (congestive heart failure)   . Exertional shortness of breath   . Pneumonia 2015 X 3  . Chronic bronchitis     "I about keep it"  . History of blood transfusion     "related to my anemia"  . Osteoarthritis   . Arthritis     "qwhere" (05/14/2015)  . Chronic upper back pain   . Kidney stone 25+yrs ago    "passed them"  . Adenocarcinoma of gastroesophageal junction   . Pernicious anemia   . Iron deficiency anemia   . Anemia, B12 deficiency     "get shots once/month" (05/14/2015)    Past Surgical History  Procedure Laterality Date  . Total hip arthroplasty Right 2001  . Tonsillectomy      as a child  . Tubal ligation  1975  . Appendectomy  1975  . Cataract extraction w/ intraocular lens  implant, bilateral Bilateral 10+yrs ago  . Esophagogastroduodenoscopy    . Colonoscopy    . Partial esophagectomy  12/20/2011    Procedure: ESOPHAGECTOMY PARTIAL;  Surgeon: Pierre Bali, MD;  Location: [place];  Service: Thoracic;  Laterality: N/A;  . Total hip arthroplasty  06/30/2012    Procedure: TOTAL HIP ARTHROPLASTY ANTERIOR APPROACH;  Surgeon: Mcarthur Rossetti, MD;  Location: [facility];  Service: Orthopedics;  Laterality: Left;  Left total hip arthroplasty  . Shoulder closed reduction  04/26/2015    Procedure: CLOSED MANIPULATION SHOULDER;  Surgeon: Meredith Pel, MD;  Location: [facility];  Service: Orthopedics;;  . Cardiac catheterization N/A 05/14/2015    Procedure: Right/Left Heart Cath and Coronary Angiography;  Surgeon: Burnell Blanks, MD;  Location: [place] [facility];  Service: Cardiovascular;  Laterality: N/A;  . Joint replacement     . Dilation and curettage of uterus    . Back surgery    . Fixation kyphoplasty thoracic spine  X 3    "broke my back in 6 places related to fall/coughing; 3 different cases"  . Orif humerus fracture Left 05/15/2015    Procedure: Left OPEN REDUCTION INTERNAL FIXATION (ORIF) PROXIMAL HUMERUS FRACTURE;  Surgeon: Meredith Pel, MD;  Location: [place];  Service: Orthopedics;  Laterality: Left;    VITAL SIGNS BP 94/62 mmHg  Pulse 75  Ht 5\' 3"  (1.6 m)  Wt 136 lb (61.689 kg)  BMI 24.10 kg/m2   Outpatient Encounter Prescriptions as of 05/27/2015  Medication Sig  . acetaminophen (TYLENOL) 500 MG tablet Take 2 tablets (1,000 mg total) by mouth 3 (three) times daily.  Marland Kitchen albuterol (PROVENTIL HFA;VENTOLIN HFA) 108 (90 BASE) MCG/ACT inhaler Inhale 2 puffs into the lungs every 4 (four) hours as needed for wheezing (wheezing).  Marland Kitchen albuterol (PROVENTIL) (2.5 MG/3ML) 0.083% nebulizer solution Take 3 mLs (2.5 mg total) by nebulization every 2 (two) hours as needed for wheezing or shortness of breath.  Marland Kitchen aspirin EC 81 MG tablet Take 81 mg by mouth every morning.   Marland Kitchen atenolol (TENORMIN) 50 MG tablet Take 50 mg by mouth 2 (two) times daily.  Marland Kitchen atorvastatin (LIPITOR) 80 MG tablet Take 1 tablet (80 mg total) by mouth daily at 6 PM.  . clopidogrel (PLAVIX) 75 MG tablet Take 1 tablet (75 mg total) by mouth daily.  . cyanocobalamin (,VITAMIN B-12,) 1000 MCG/ML injection Inject 1,000 mcg into the muscle every 6 (six) weeks.  . ergocalciferol (VITAMIN D2) 50000 UNITS capsule Take 50,000 Units by mouth every 14 (fourteen) days.   Marland Kitchen esomeprazole (NEXIUM) 20 MG capsule Take 20 mg by mouth daily at 12 noon.  . Fluticasone Furoate-Vilanterol 200-25 MCG/INH AEPB Inhale 2 puffs into the lungs every morning.  . furosemide (LASIX) 40 MG tablet Take 1 tablet (40 mg total) by mouth every morning.  Marland Kitchen guaiFENesin (MUCINEX) 600 MG 12 hr tablet Take 1 tablet (600 mg total) by mouth 2 (two) times daily.  . hydroxypropyl  methylcellulose (ISOPTO TEARS) 2.5 % ophthalmic solution Place 1 drop into both eyes 3 (three) times daily as needed for dry eyes.  Marland Kitchen ipratropium-albuterol (DUONEB) 0.5-2.5 (3) MG/3ML SOLN Take 3 mLs by nebulization every 6 (six) hours.  Marland Kitchen levofloxacin (LEVAQUIN) 500 MG tablet Take 500 mg by mouth 2 (two) times daily.  Marland Kitchen lisinopril (PRINIVIL,ZESTRIL) 40 MG tablet Take 20 mg by mouth every evening.  . loratadine (CLARITIN) 10 MG tablet Take 10 mg by mouth  every morning.   Marland Kitchen LORazepam (ATIVAN) 0.5 MG tablet Take 1 tablet (0.5 mg total) by mouth every 8 (eight) hours as needed for anxiety.  . nitroGLYCERIN (NITROSTAT) 0.4 MG SL tablet Place 1 tablet (0.4 mg total) under the tongue every 5 (five) minutes as needed for chest pain.  . polyethylene glycol (MIRALAX / GLYCOLAX) packet Take 17 g by mouth daily.  . potassium chloride SA (K-DUR,KLOR-CON) 20 MEQ tablet Take 1 tablet (20 mEq total) by mouth daily.  . predniSONE (DELTASONE) 1 MG tablet Take 2 mg by mouth daily with breakfast.  . saccharomyces boulardii (FLORASTOR) 250 MG capsule Take 1 capsule (250 mg total) by mouth 2 (two) times daily.  Marland Kitchen senna (SENOKOT) 8.6 MG TABS tablet Take 1 tablet (8.6 mg total) by mouth at bedtime.  . sodium chloride (OCEAN) 0.65 % SOLN nasal spray Place 1 spray into both nostrils as needed for congestion.  . ST JOHNS WORT PO Take 1 capsule by mouth 2 (two) times daily.  Marland Kitchen tiZANidine (ZANAFLEX) 4 MG tablet Take 0.5-1 tablets (2-4 mg total) by mouth every 8 (eight) hours as needed for muscle spasms.      SIGNIFICANT DIAGNOSTIC EXAMS  05-12-15: 2-d echo: Left ventricle: The cavity size was normal. Wall thickness was normal. Systolic function was mildly to moderately reduced. The estimated ejection fraction was in the range of 40% to 45%. Mild diffuse hypokinesis with no identifiable regional variations. Features are consistent with a pseudonormal left ventricular filling pattern, with concomitant abnormal relaxation and  increased filling pressure (grade 2 diastolic dysfunction). - Aortic valve: There was moderate stenosis. There was trivial regurgitation. - Mitral valve: Moderately to severely fibrotic annulus. Mildly thickened leaflets . There was mild regurgitation directed centrally. - Left atrium: The atrium was moderately dilated. - Pulmonary arteries: Systolic pressure was mildly increased.   05-14-15: cardiac cath: 1. 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 Moderately severe stenosis ostial left main artery 3. Severe stenosis mid RCA 4. Mild LV systolic dysfunction 5. Pulmonary HTN  05-15-15: left shoulder x-ray: Satisfactory fracture fixation.   05-19-15: chest x-ray: New right perihilar infiltrate.     LABS REVIEWED:   05-14-15: wbc 9.7; hgb 8.8; hct 29.3; mcv 88; plt 484;  05-15-15: wbc 8.8; hgb 9.6; hct 32.3; mcv 87.1; plt 465; glucose 84; bun 11; creat 0.84; k+4.2; na++137 05-19-15: wbc 11.3; hgb 9.6; hct 31.5; mv 85.1; plt 448; glucose 111; bun 20; creat 1.32; k+5.1; na++133 05-24-15: wbc 13.3; hgb 11.0; hct 36.3; mcv 85.2; plt 566; glucose 87; bun 27; creat 0.97; k+5.8; na++135 05-27-15: wbc 17.8; hgb 9.5; hct 30.8; mcv 81; plt 475; glucose 95; bun 38; creat 1.37; k+4.5;  na++125  neutr 89.1     Review of Systems  Constitutional: Positive for malaise/fatigue.  Respiratory: Positive for cough and sputum production. Negative for shortness of breath.   Cardiovascular: Negative for chest pain, palpitations and leg swelling.  Gastrointestinal: Positive for nausea, abdominal pain and constipation. Negative for heartburn.  Musculoskeletal: Negative for myalgias and joint pain.  Skin: Negative.   Psychiatric/Behavioral: The patient is not nervous/anxious.      Physical Exam Constitutional: She is oriented to person, place, and time. No distress.  Thin   Neck: Neck supple. No JVD present. No thyromegaly present.  Cardiovascular: Normal rate, regular rhythm  and intact distal pulses.   Respiratory:breath sounds diminished; few rhonchi present.   GI: Soft. Bowel sounds are normal. She exhibits no distension.  suprapubic tenderness present Musculoskeletal: She exhibits no edema.  Left arm in sling   Neurological: She is alert and oriented to person, place, and time.  Skin: Skin is warm and dry. She is not diaphoretic.  Incision line without signs of infection present.      ASSESSMENT/ PLAN:  1.UTI 2. URI 3. Acute renal failure with hyponatremia 4. Leukocytosis   Will get stat kub; blood cultures X2 sites will repeat chest x-ray in the AM Will continue daily weights Will hold lasix and k+ for 2 days Will begin NS at 75 cc per hour for 2 liters then check cbc and bmp Will get ua/c&s will i/o cath every 6 hours as needed Will stop the levaquin and will begin augmentin 875 mg twice daily for 2 weeks  I did speak with Richardson Dopp PA at cardiology to update him  Will monitor her status   Time spent with patient 50 minutes.   Ok Edwards NP [facility]  Contact [phone] Monday through Friday 8am- 5pm  After hours call [phone]

## 2015-05-27 NOTE — Telephone Encounter (Signed)
This encounter was created in error - please disregard.

## 2015-05-28 ENCOUNTER — Non-Acute Institutional Stay (SKILLED_NURSING_FACILITY): Payer: Medicare Other | Admitting: Adult Health

## 2015-05-28 ENCOUNTER — Encounter: Payer: Self-pay | Admitting: Adult Health

## 2015-05-28 ENCOUNTER — Telehealth: Payer: Self-pay | Admitting: *Deleted

## 2015-05-28 DIAGNOSIS — N179 Acute kidney failure, unspecified: Secondary | ICD-10-CM | POA: Diagnosis not present

## 2015-05-28 DIAGNOSIS — J069 Acute upper respiratory infection, unspecified: Secondary | ICD-10-CM

## 2015-05-28 DIAGNOSIS — J449 Chronic obstructive pulmonary disease, unspecified: Secondary | ICD-10-CM

## 2015-05-28 DIAGNOSIS — I5042 Chronic combined systolic (congestive) and diastolic (congestive) heart failure: Secondary | ICD-10-CM | POA: Diagnosis not present

## 2015-05-28 NOTE — Progress Notes (Signed)
Reviewed with Dr. Fransico Him. Will cancel event monitor. Richardson Dopp, PA-C   05/28/2015 10:49 AM

## 2015-05-28 NOTE — Addendum Note (Signed)
Addended byKathlen Mody, Nicki Reaper T on: 05/28/2015 10:52 AM   Modules accepted: Orders

## 2015-05-28 NOTE — Telephone Encounter (Signed)
Patient's Son [name] called asking to speak with Tanja Port NP and/or return call from Dr. [name].  Return number [phone].  "I want to thank Dr. [name] and Lattie Haw for all they've done to help my mom survive cancer for four years.  She's scheduled for F/U August 28, 2015 but has been hurting real bad.  Saw cardiologist before surgery.  Needs four-way bypass but it's inoperable.  Medication has helped slow heart beat and lower B/P so she'll be moved today or tomorrow from skilled nursing on [place] to [place] closer to home and family.  Want to notify Dr. [name] and thank him because I don't know if she'll make it till September."  Will notify Dr. [name].

## 2015-05-28 NOTE — Progress Notes (Signed)
Patient ID: [name], female   DOB: 03-Feb-1934, 81 y.o.   MRN: [id number]  [facility]     Allergies  Allergen Reactions  . Keflex [Cephalexin] Itching    "severe itching"  . Nulecit [Na Ferric Gluc Cplx In Sucrose] Diarrhea, Nausea Only and Other (See Comments)      drop in BP  . Biaxin [Clarithromycin] Itching  . Fentanyl Itching  . Spiriva Handihaler [Tiotropium Bromide Monohydrate] Other (See Comments)    constipation  . Tramadol Itching    Skin burning and itching  . Adhesive [Tape] Itching  . Clarithromycin Itching  . Darvon Itching  . Epinephrine Itching  . Morphine And Related Itching  . Oxycodone Itching  . Sulfa Antibiotics Itching  . Vicodin [Hydrocodone-Acetaminophen] Itching    Can take with Benadryl       Chief Complaint  Patient presents with  . Discharge Note    HPI:  She is being discharged to another snf in order to be closer to her family. She is presently receiving the second liter of her ivf. Her urine culture is pending and she has been started on abt. She will not need home health; dme or prescriptions. She will be followed by the pcp at the facility.    Past Medical History  Diagnosis Date  . Fibromyalgia   . Skin lesion     chronic calcific cutaneous lesions  . HX of multiple bleeding ulcers 09/01/2011  . Hypertension     takes Atenolol daily  . Emphysema   . Dizziness     d/t crystals in ears that "roll around"and can't get them out  . Osteoporosis   . Scoliosis   . Psoriasis   . H/O hiatal hernia   . GERD (gastroesophageal reflux disease)     takes Aciphex daily  . Gastric ulcer   . Constipation     since Chemo and Radiation;finished up in Nov 2012  . Diverticulosis   . History of colonic polyps   . Shingles     broke out 2wks ago;has been on meds and to finish up tomorrow  . Vitamin B deficiency   . COPD (chronic obstructive pulmonary disease)   . On home oxygen therapy     "2L;  24/7" (05/14/2015)  . Left humeral  fracture 04/26/2015  . Scoliosis   . Complication of anesthesia     shortness of breath and pain with gas   . CHF (congestive heart failure)   . Exertional shortness of breath   . Pneumonia 2015 X 3  . Chronic bronchitis     "I about keep it"  . History of blood transfusion     "related to my anemia"  . Osteoarthritis   . Arthritis     "qwhere" (05/14/2015)  . Chronic upper back pain   . Kidney stone 25+yrs ago    "passed them"  . Adenocarcinoma of gastroesophageal junction   . Pernicious anemia   . Iron deficiency anemia   . Anemia, B12 deficiency     "get shots once/month" (05/14/2015)    Past Surgical History  Procedure Laterality Date  . Total hip arthroplasty Right 2001  . Tonsillectomy      as a child  . Tubal ligation  1975  . Appendectomy  1975  . Cataract extraction w/ intraocular lens  implant, bilateral Bilateral 10+yrs ago  . Esophagogastroduodenoscopy    . Colonoscopy    . Partial esophagectomy  12/20/2011    Procedure: ESOPHAGECTOMY PARTIAL;  Surgeon: Pierre Bali, MD;  Location: [place];  Service: Thoracic;  Laterality: N/A;  . Total hip arthroplasty  06/30/2012    Procedure: TOTAL HIP ARTHROPLASTY ANTERIOR APPROACH;  Surgeon: Mcarthur Rossetti, MD;  Location: [facility];  Service: Orthopedics;  Laterality: Left;  Left total hip arthroplasty  . Shoulder closed reduction  04/26/2015    Procedure: CLOSED MANIPULATION SHOULDER;  Surgeon: Meredith Pel, MD;  Location: [facility];  Service: Orthopedics;;  . Cardiac catheterization N/A 05/14/2015    Procedure: Right/Left Heart Cath and Coronary Angiography;  Surgeon: Burnell Blanks, MD;  Location: [place] [facility];  Service: Cardiovascular;  Laterality: N/A;  . Joint replacement    . Dilation and curettage of uterus    . Back surgery    . Fixation kyphoplasty thoracic spine  X 3    "broke my back in 6 places related to fall/coughing; 3 different cases"  . Orif humerus fracture Left 05/15/2015    Procedure:  Left OPEN REDUCTION INTERNAL FIXATION (ORIF) PROXIMAL HUMERUS FRACTURE;  Surgeon: Meredith Pel, MD;  Location: [place];  Service: Orthopedics;  Laterality: Left;    VITAL SIGNS BP 110/70 mmHg  Pulse 80  Ht 5\' 3"  (1.6 m)  Wt 136 lb (61.689 kg)  BMI 24.10 kg/m2   Outpatient Encounter Prescriptions as of 05/28/2015  Medication Sig  . acetaminophen (TYLENOL) 500 MG tablet Take 2 tablets (1,000 mg total) by mouth 3 (three) times daily.  Marland Kitchen albuterol (PROVENTIL HFA;VENTOLIN HFA) 108 (90 BASE) MCG/ACT inhaler Inhale 2 puffs into the lungs every 4 (four) hours as needed for wheezing (wheezing).  Marland Kitchen albuterol (PROVENTIL) (2.5 MG/3ML) 0.083% nebulizer solution Take 3 mLs (2.5 mg total) by nebulization every 2 (two) hours as needed for wheezing or shortness of breath.  Marland Kitchen aspirin EC 81 MG tablet Take 81 mg by mouth every morning.   Marland Kitchen atenolol (TENORMIN) 50 MG tablet Take 50 mg by mouth 2 (two) times daily.  Marland Kitchen atorvastatin (LIPITOR) 80 MG tablet Take 1 tablet (80 mg total) by mouth daily at 6 PM.  . clopidogrel (PLAVIX) 75 MG tablet Take 1 tablet (75 mg total) by mouth daily.  . cyanocobalamin (,VITAMIN B-12,) 1000 MCG/ML injection Inject 1,000 mcg into the muscle every 6 (six) weeks.  . ergocalciferol (VITAMIN D2) 50000 UNITS capsule Take 50,000 Units by mouth every 14 (fourteen) days.   Marland Kitchen esomeprazole (NEXIUM) 20 MG capsule Take 20 mg by mouth daily at 12 noon.  . Fluticasone Furoate-Vilanterol 200-25 MCG/INH AEPB Inhale 2 puffs into the lungs every morning.  . furosemide (LASIX) 40 MG tablet Take 1 tablet (40 mg total) by mouth every morning.  Marland Kitchen guaiFENesin (MUCINEX) 600 MG 12 hr tablet Take 1 tablet (600 mg total) by mouth 2 (two) times daily.  . hydroxypropyl methylcellulose (ISOPTO TEARS) 2.5 % ophthalmic solution Place 1 drop into both eyes 3 (three) times daily as needed for dry eyes.  Marland Kitchen ipratropium-albuterol (DUONEB) 0.5-2.5 (3) MG/3ML SOLN Take 3 mLs by nebulization every 6 (six) hours.  Marland Kitchen  levofloxacin (LEVAQUIN) 500 MG tablet Take 500 mg by mouth 2 (two) times daily.  Marland Kitchen lisinopril (PRINIVIL,ZESTRIL) 40 MG tablet Take 20 mg by mouth every evening.  . loratadine (CLARITIN) 10 MG tablet Take 10 mg by mouth every morning.   Marland Kitchen LORazepam (ATIVAN) 0.5 MG tablet Take 1 tablet (0.5 mg total) by mouth every 8 (eight) hours as needed for anxiety.  . nitroGLYCERIN (NITROSTAT) 0.4 MG SL tablet Place 1 tablet (  0.4 mg total) under the tongue every 5 (five) minutes as needed for chest pain.  . polyethylene glycol (MIRALAX / GLYCOLAX) packet Take 17 g by mouth daily.  . potassium chloride SA (K-DUR,KLOR-CON) 20 MEQ tablet Take 1 tablet (20 mEq total) by mouth daily.  . predniSONE (DELTASONE) 1 MG tablet Take 2 mg by mouth daily with breakfast.  . saccharomyces boulardii (FLORASTOR) 250 MG capsule Take 1 capsule (250 mg total) by mouth 2 (two) times daily.  Marland Kitchen senna (SENOKOT) 8.6 MG TABS tablet Take 1 tablet (8.6 mg total) by mouth at bedtime.  . sodium chloride (OCEAN) 0.65 % SOLN nasal spray Place 1 spray into both nostrils as needed for congestion.  . ST JOHNS WORT PO Take 1 capsule by mouth 2 (two) times daily.  Marland Kitchen tiZANidine (ZANAFLEX) 4 MG tablet Take 0.5-1 tablets (2-4 mg total) by mouth every 8 (eight) hours as needed for muscle spasms.      SIGNIFICANT DIAGNOSTIC EXAMS   05-12-15: 2-d echo: Left ventricle: The cavity size was normal. Wall thickness was normal. Systolic function was mildly to moderately reduced. The estimated ejection fraction was in the range of 40% to 45%. Mild diffuse hypokinesis with no identifiable regional variations. Features are consistent with a pseudonormal left ventricular filling pattern, with concomitant abnormal relaxation and increased filling pressure (grade 2 diastolic dysfunction). - Aortic valve: There was moderate stenosis. There was trivial regurgitation. - Mitral valve: Moderately to severely fibrotic annulus. Mildly thickened leaflets . There was mild  regurgitation directed centrally. - Left atrium: The atrium was moderately dilated. - Pulmonary arteries: Systolic pressure was mildly increased.   05-14-15: cardiac cath: 1. 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 Moderately severe stenosis ostial left main artery 3. Severe stenosis mid RCA 4. Mild LV systolic dysfunction 5. Pulmonary HTN  05-15-15: left shoulder x-ray: Satisfactory fracture fixation.   05-19-15: chest x-ray: New right perihilar infiltrate.  05-28-15: chest x-ray: no acute disease present.   LABS REVIEWED:   05-14-15: wbc 9.7; hgb 8.8; hct 29.3; mcv 88; plt 484;  05-15-15: wbc 8.8; hgb 9.6; hct 32.3; mcv 87.1; plt 465; glucose 84; bun 11; creat 0.84; k+4.2; na++137 05-19-15: wbc 11.3; hgb 9.6; hct 31.5; mv 85.1; plt 448; glucose 111; bun 20; creat 1.32; k+5.1; na++133 05-24-15: wbc 13.3; hgb 11.0; hct 36.3; mcv 85.2; plt 566; glucose 87; bun 27; creat 0.97; k+5.8; na++135 05-27-15: wbc 17.8; hgb 9.5; hct 30.8; mcv 81; plt 475; glucose 95; bun 38; creat 1.37; k+4.5;  na++125  neutr 89.1    ROS Constitutional: Positive for malaise/fatigue.  Respiratory: Positive for cough and sputum production. Negative for shortness of breath.   Cardiovascular: Negative for chest pain, palpitations and leg swelling.  Gastrointestinal: Positive for nausea, abdominal pain and constipation. Negative for heartburn.  Musculoskeletal: Negative for myalgias and joint pain.  Skin: Negative.   Psychiatric/Behavioral: The patient is not nervous/anxious.       Physical Exam Constitutional: She is oriented to person, place, and time. No distress.  Thin   Neck: Neck supple. No JVD present. No thyromegaly present.  Cardiovascular: Normal rate, regular rhythm and intact distal pulses.   Respiratory:breath sounds diminished; few rhonchi present.   GI: Soft. Bowel sounds are normal. She exhibits no distension. suprapubic tenderness present Musculoskeletal: She  exhibits no edema.  Left arm in sling   Neurological: She is alert and oriented to person, place, and time.  Skin: Skin is warm and dry.  She is not diaphoretic.  Incision line without signs of infection present.       ASSESSMENT/ PLAN:  Will discharge her to snf. I have written orders for the facility to know that she will need a cbc and bmp upon arrival to the facility to fax this information to her cardiologist.   Time spent with patient 35 minutes.    Ok Edwards NP [facility]  Contact [phone] Monday through Friday 8am- 5pm  After hours call [phone]

## 2015-06-02 ENCOUNTER — Telehealth: Payer: Self-pay | Admitting: Cardiology

## 2015-06-02 NOTE — Telephone Encounter (Signed)
New message       Needing an order for Hospice to go out and see patient.  Also needing to know if patient is eligible and would Dr. Radford Pax be attending physician.  Please advise

## 2015-06-03 NOTE — Telephone Encounter (Signed)
Patient has multiple comorbid conditions and this decision needs to be made by PCP - I only saw her once

## 2015-06-03 NOTE — Telephone Encounter (Signed)
[facility]osp[facility] patient's son called requesting Hospice care for her, so there is no official order. Hospice would like to know if: 1) patient qualifies for Hospice 2) if Dr. Radford Pax will agree to be attending physician if patient qualifies  To Dr. Radford Pax.

## 2015-06-03 NOTE — Telephone Encounter (Signed)
Informed Hospice that this decision needs to be made by PCP.  Instructed them to call if they have further questions.

## 2015-06-16 ENCOUNTER — Ambulatory Visit: Payer: Medicare Other | Admitting: Cardiology

## 2015-07-10 NOTE — Progress Notes (Signed)
Cardiology Office Note   Date:  07/11/2015   ID:  [name], DOB August 25, 1934, MRN [id number]  PCP:  Martinique, [name], MD  Cardiologist:  Dr. Fransico Him     Chief Complaint  Patient presents with  . Coronary Artery Disease  . Aortic Stenosis  . Congestive Heart Failure     History of Present Illness: [name] is a 81 y.o. female with a hx of HTN, diastolic HF, GERD, pernicious anemia, prior GI bleed, esophageal adenocarcinoma, COPD, remote history of tobacco abuse. She was evaluated by Dr. Radford Pax 05/08/15 for surgical clearance for ORIF of humeral fracture. She had already had an initial surgery but needed more extensive surgery. She complained of rapid palpitations associated with neck and left arm discomfort. She had evidence of CHF and noted coronary calcifications on CT scan from 2015.  She was admitted 6/8-6/14 and underwent cardiac catheterization that showed severe multivessel CAD including significant ost LM and mid RCA disease which would be best approached by bypass surgery.  However she was felt to be a poor candidate for CABG given her advanced age, severe COPD, chronic steroid-induced use and poor mobility. High risk PCI could be considered, however cardiac catheterization showed severe left main disease that is very difficult to approach by PCI given ostial location. Echo demonstrated EF 40-10%, grade 2 diastolic dysfunction, moderate aortic stenosis with mean gradient 20 mmHg, moderately dilated LA and PASP 46 mmHg.  She was seen by Dr. Roxan Hockey of cardiothoracic surgery team and it was felt that her risk associated with surgery is prohibitive. After review by interventional cardiology, med Rx was recommended.   She underwent open reduction and internal fixation of left proximal humerus fracture on 05/15/2015 despite high risk. It was noted that if she was significantly symptomatic, there would be a very low threshold to potentially do PCI of RCA, although L main  would continue to pose a problem.    I saw her June 21. She was on antibiotics at that time for COPD exacerbation and UTI. She had significant side effects as well as symptoms of urinary retention. She was hypotensive. I spoke to the attending provider at her nursing home at that time and medication adjustments were made. At that time, I did review her case with Dr. Radford Pax. Dr. Radford Pax did not feel that event monitor was necessary.  She is here by herself today. She was released from a nursing home in [place] several weeks ago. She is currently at home. She does note worsening edema. Her dyspnea is also worsened. She is on chronic O2. She sleeps on 3 pillows chronically. She denies PND. She has occasional episodes of chest discomfort that are consistent with angina. She has associated left arm and jaw pain. She typically takes nitroglycerin with relief. She may have one episode a week or she may have 2-3 episodes in a day. She also notes rapid palpitations. This may last 5 minutes or less.   Studies/Reports Reviewed Today:  [facility]HC 05/14/15  Prox RCA to Mid RCA lesion, 99% stenosed.  Dist RCA lesion, 40% stenosed.  Ost RPDA lesion, 80% stenosed.  Ost LM to LM lesion, 75% stenosed. 1. 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 Moderately severe stenosis ostial left main artery 3. Severe stenosis mid RCA 4. Mild LV systolic dysfunction 5. Pulmonary HTN Recommendations: This a complex situation. She is an elderly female with left arm fracture with need for surgical correction. She  has no angina but is found to have severe multi-vessel CAD. Her CAD would be best approached with bypass surgery but she is a poor candidate for CABG given advanced age, severe COPD, chronic steroid use and poor mobility. She is also a poor candidate for stenting given the location of the left main disease and history of chronic anemia. Her RCA could be easily treated with PCI. The  moderately severe left main stenosis would be more difficult to treat with PCI.  I will admit to telemetry. I see three options here. The first option would be CABG so I will ask CT surgery to see her but I do not think she is a good candidate for an open chest procedure because of reasons outlined above. If CT surgery does not feel that she would be a surgical candidate, would have to consider PCI of the RCA with medical management of the left main lesion. We could also consider palliation with her overall poor functional status and poor prognosis. Will continue home meds. Will not start Plavix until we have a better disposition.   Echo 05/12/15 - EF 40% to 45%. Milddiffuse hypokinesis with no identifiable regional variations. Grade 2 diastolic dysfunction. - Aortic valve: There was moderate stenosis. There was trivialregurgitation. Mean gradient (S): 20 mm Hg. Peak gradient (S): 39 mm Hg. - Mitral valve: Moderately to severely fibrotic annulus. Mildlythickened leaflets . There was mild regurgitation directedcentrally. Valve area by continuity equation (using LVOT flow):1.67 cm^2. - Left atrium: The atrium was moderately dilated. - Pulmonary arteries: Systolic pressure was mildly increased. PApeak pressure: 46 mm Hg (S).  Myoview 11/2011 Normal stress nuclear study  Past Medical History  Diagnosis Date  . Fibromyalgia   . Skin lesion     chronic calcific cutaneous lesions  . HX of multiple bleeding ulcers 09/01/2011  . Hypertension     takes Atenolol daily  . Emphysema   . Dizziness     d/t crystals in ears that "roll around"and can't get them out  . Osteoporosis   . Scoliosis   . Psoriasis   . H/O hiatal hernia   . GERD (gastroesophageal reflux disease)     takes Aciphex daily  . Gastric ulcer   . Constipation     since Chemo and Radiation;finished up in Nov 2012  . Diverticulosis   . History of colonic polyps   . Shingles     broke out 2wks ago;has been on meds and to  finish up tomorrow  . Vitamin B deficiency   . COPD (chronic obstructive pulmonary disease)   . On home oxygen therapy     "2L;  24/7" (05/14/2015)  . Left humeral fracture 04/26/2015  . Scoliosis   . Complication of anesthesia     shortness of breath and pain with gas   . CHF (congestive heart failure)   . Exertional shortness of breath   . Pneumonia 2015 X 3  . Chronic bronchitis     "I about keep it"  . History of blood transfusion     "related to my anemia"  . Osteoarthritis   . Arthritis     "qwhere" (05/14/2015)  . Chronic upper back pain   . Kidney stone 25+yrs ago    "passed them"  . Adenocarcinoma of gastroesophageal junction   . Pernicious anemia   . Iron deficiency anemia   . Anemia, B12 deficiency     "get shots once/month" (05/14/2015)    Past Surgical History  Procedure Laterality Date  .  Total hip arthroplasty Right 2001  . Tonsillectomy      as a child  . Tubal ligation  1975  . Appendectomy  1975  . Cataract extraction w/ intraocular lens  implant, bilateral Bilateral 10+yrs ago  . Esophagogastroduodenoscopy    . Colonoscopy    . Partial esophagectomy  12/20/2011    Procedure: ESOPHAGECTOMY PARTIAL;  Surgeon: Pierre Bali, MD;  Location: [facility];  Service: Thoracic;  Laterality: N/A;  . Total hip arthroplasty  06/30/2012    Procedure: TOTAL HIP ARTHROPLASTY ANTERIOR APPROACH;  Surgeon: Mcarthur Rossetti, MD;  Location: [facility];  Service: Orthopedics;  Laterality: Left;  Left total hip arthroplasty  . Shoulder closed reduction  04/26/2015    Procedure: CLOSED MANIPULATION SHOULDER;  Surgeon: Meredith Pel, MD;  Location: [facility];  Service: Orthopedics;;  . Cardiac catheterization N/A 05/14/2015    Procedure: Right/Left Heart Cath and Coronary Angiography;  Surgeon: Burnell Blanks, MD;  Location: [facility];  Service: Cardiovascular;  Laterality: N/A;  . Joint replacement    . Dilation and curettage of uterus    . Back surgery    .  Fixation kyphoplasty thoracic spine  X 3    "broke my back in 6 places related to fall/coughing; 3 different cases"  . Orif humerus fracture Left 05/15/2015    Procedure: Left OPEN REDUCTION INTERNAL FIXATION (ORIF) PROXIMAL HUMERUS FRACTURE;  Surgeon: Meredith Pel, MD;  Location: [facility];  Service: Orthopedics;  Laterality: Left;     Current Outpatient Prescriptions  Medication Sig Dispense Refill  . acetaminophen (TYLENOL) 500 MG tablet Take 2 tablets (1,000 mg total) by mouth 3 (three) times daily. 30 tablet 0  . albuterol (PROVENTIL HFA;VENTOLIN HFA) 108 (90 BASE) MCG/ACT inhaler Inhale 2 puffs into the lungs every 4 (four) hours as needed for wheezing (wheezing).    Marland Kitchen albuterol (PROVENTIL) (2.5 MG/3ML) 0.083% nebulizer solution Take 3 mLs (2.5 mg total) by nebulization every 2 (two) hours as needed for wheezing or shortness of breath. 75 mL 12  . aspirin EC 81 MG tablet Take 81 mg by mouth every morning.     Marland Kitchen atenolol (TENORMIN) 50 MG tablet Take 50 mg by mouth 2 (two) times daily.    Marland Kitchen atorvastatin (LIPITOR) 80 MG tablet Take 1 tablet (80 mg total) by mouth daily at 6 PM. 30 tablet 11  . clopidogrel (PLAVIX) 75 MG tablet Take 1 tablet (75 mg total) by mouth daily. 30 tablet 11  . cyanocobalamin (,VITAMIN B-12,) 1000 MCG/ML injection Inject 1,000 mcg into the muscle every 6 (six) weeks.    . ergocalciferol (VITAMIN D2) 50000 UNITS capsule Take 50,000 Units by mouth every 14 (fourteen) days.     Marland Kitchen esomeprazole (NEXIUM) 20 MG capsule Take 20 mg by mouth daily at 12 noon.    . Fluticasone Furoate-Vilanterol 200-25 MCG/INH AEPB Inhale 2 puffs into the lungs every morning.    . furosemide (LASIX) 40 MG tablet Take 1.5 tablets (60 mg total) by mouth every morning. 30 tablet 3  . guaiFENesin (MUCINEX) 600 MG 12 hr tablet Take 1 tablet (600 mg total) by mouth 2 (two) times daily.    . hydroxypropyl methylcellulose (ISOPTO TEARS) 2.5 % ophthalmic solution Place 1 drop into both eyes 3 (three)  times daily as needed for dry eyes.    Marland Kitchen ipratropium-albuterol (DUONEB) 0.5-2.5 (3) MG/3ML SOLN Take 3 mLs by nebulization every 6 (six) hours. 360 mL   . levofloxacin (LEVAQUIN) 500 MG  tablet Take 500 mg by mouth 2 (two) times daily.    Marland Kitchen lisinopril (PRINIVIL,ZESTRIL) 40 MG tablet Take 20 mg by mouth every evening.    . loratadine (CLARITIN) 10 MG tablet Take 10 mg by mouth every morning.     Marland Kitchen LORazepam (ATIVAN) 0.5 MG tablet Take 1 tablet (0.5 mg total) by mouth every 8 (eight) hours as needed for anxiety. 14 tablet 0  . losartan (COZAAR) 100 MG tablet Take 100 mg by mouth daily.     . nitroGLYCERIN (NITROSTAT) 0.4 MG SL tablet Place 1 tablet (0.4 mg total) under the tongue every 5 (five) minutes as needed for chest pain. 25 tablet 3  . polyethylene glycol (MIRALAX / GLYCOLAX) packet Take 17 g by mouth daily. 14 each 0  . potassium chloride SA (K-DUR,KLOR-CON) 20 MEQ tablet Take 1 tablet (20 mEq total) by mouth daily. 30 tablet 6  . predniSONE (DELTASONE) 1 MG tablet Take 2 mg by mouth daily with breakfast.    . saccharomyces boulardii (FLORASTOR) 250 MG capsule Take 1 capsule (250 mg total) by mouth 2 (two) times daily. 30 capsule 0  . senna (SENOKOT) 8.6 MG TABS tablet Take 1 tablet (8.6 mg total) by mouth at bedtime. 120 each 0  . sodium chloride (OCEAN) 0.65 % SOLN nasal spray Place 1 spray into both nostrils as needed for congestion. 15 mL 0  . ST JOHNS WORT PO Take 1 capsule by mouth 2 (two) times daily.    Marland Kitchen tiZANidine (ZANAFLEX) 4 MG tablet Take 0.5-1 tablets (2-4 mg total) by mouth every 8 (eight) hours as needed for muscle spasms. 30 tablet 0   No current facility-administered medications for this visit.    Allergies:   Keflex; Nulecit; Biaxin; Fentanyl; Spiriva handihaler; Tramadol; Adhesive; Clarithromycin; Darvon; Epinephrine; Morphine and related; Oxycodone; Sulfa antibiotics; and Vicodin    Social History:  The patient  reports that she quit smoking about 23 years ago. Her  smoking use included Cigarettes. She has a 135 pack-year smoking history. She has never used smokeless tobacco. She reports that she does not drink alcohol or use illicit drugs.   Family History:  The patient's family history includes Breast cancer in her paternal aunt, paternal aunt, and paternal aunt; Cancer in her maternal aunt and maternal grandmother. There is no history of Anesthesia problems, Hypotension, Malignant hyperthermia, or Pseudochol deficiency.    ROS:   Please see the history of present illness.   Review of Systems  Cardiovascular: Positive for chest pain, irregular heartbeat, leg swelling and orthopnea.  Respiratory: Positive for cough.   Hematologic/Lymphatic: Bruises/bleeds easily.  Musculoskeletal: Positive for back pain and joint swelling.  Gastrointestinal: Positive for constipation.  Genitourinary: Positive for dysuria.  All other systems reviewed and are negative.     PHYSICAL EXAM: VS:  BP 120/50 mmHg  Pulse 78  Ht 5\' 3"  (1.6 m)  Wt 136 lb (61.689 kg)  BMI 24.10 kg/m2  SpO2 98%    Wt Readings from Last 3 Encounters:  07/11/15 136 lb (61.689 kg)  05/28/15 136 lb (61.689 kg)  05/27/15 136 lb (61.689 kg)     GEN: chronically ill appearing female in a wheelchair with O2, in no acute distress HEENT: normal Neck: + JVD at 90 degrees, no masses Cardiac:  distant X3/A3, RRR; 2/6 systolic murmur RUBS,  no rubs or gallops, 2+ bilateral pedal edema  Respiratory:  Decreased breath sounds, no rales or wheezing GI: Distended, non-tender  MS: no deformity or atrophy Skin:  warm and dry  Neuro:  CNs II-XII intact, Strength and sensation are intact Psych: Normal affect   EKG:  EKG is ordered today.  It demonstrates:   NSR, HR 78, LAD, Q waves V1-2, NSSTTW changes, QTc 465 ms, no significant change when compared to prior tracing.   Recent Labs: 07/14/2014: ALT <5 04/29/2015: B Natriuretic Peptide 707.5* 05/08/2015: Pro B Natriuretic peptide (BNP)  469.0* 05/27/2015: BUN 38*; Creatinine, Ser 1.37*; Hemoglobin 9.9*; Platelets 475.0*; Potassium 4.5; Sodium 125*    Lipid Panel No results found for: CHOL, TRIG, HDL, CHOLHDL, VLDL, LDLCALC, LDLDIRECT    ASSESSMENT AND PLAN:  Coronary artery disease:  Patient was noted to have severe 3 vessel CAD with ostial left main stenosis and critical mid RCA lesion. EF is 40-45% on echocardiogram with moderate aortic stenosis (mean gradient 20 mmHg). As noted, she was not felt to be an operative candidate and turned down for CABG.  She could be a candidate for high risk PCI of the RCA. However, left main disease would continue to be an issue.   She continues to have episodes of angina. She does have evidence of volume excess. I suspect that volume excess illness causing worsening of her angina. Continue current dose of beta blocker, aspirin, Plavix, statin.   Aortic stenosis:  Moderate by recent echocardiogram. As noted, she is not an operative candidate. Given her severe CAD, I'm not sure that she would be a candidate for TAVR as well.  Cardiomyopathy:  EF 40-45% by recent echocardiogram. Continue beta blocker, ACE inhibitor (at reduced dose).  Acute on Chronic Systolic and Diastolic CHF:   She is volume overloaded. Get BMET and BNP today (unless drawn at PCP office yesterday - will request).  Increase Lasix to 40 mg Twice daily x 3 days, then Lasix 60 mg QD.  Check BMET 1 week.   Essential hypertension:  Controlled.   Adenocarcinoma of gastroesophageal junction:  She has been sleeping sitting up in a chair since she was diagnosed with CA.  Chronic obstructive pulmonary disease, unspecified COPD, unspecified chronic bronchitis type:  She has a chronic cough related to COPD.    Paroxysmal SVT (supraventricular tachycardia):  SVT noted in the hospital on telemetry. She is on beta blocker therapy. I have previously reviewed this with Dr. Fransico Him and we do not plan to get an event monitor.      Medication Changes: Current medicines are reviewed at length with the patient today.  Concerns regarding medicines are as outlined above.  The following changes have been made:   Discontinued Medications   No medications on file   Modified Medications   Modified Medication Previous Medication   FUROSEMIDE (LASIX) 40 MG TABLET furosemide (LASIX) 40 MG tablet      Take 1.5 tablets (60 mg total) by mouth every morning.    Take 1 tablet (40 mg total) by mouth every morning.   New Prescriptions   No medications on file    Labs/ tests ordered today include:   Orders Placed This Encounter  Procedures  . Basic Metabolic Panel (BMET)  . EKG 12-Lead     Disposition:   FU with Dr. Fransico Him 2 mos.    Signed, Versie Starks, MHS 07/11/2015 1:14 PM    [facility] [address], [place], [place]  [address] Phone: [phone]; Fax: [phone]

## 2015-07-11 ENCOUNTER — Encounter: Payer: Self-pay | Admitting: Physician Assistant

## 2015-07-11 ENCOUNTER — Telehealth: Payer: Self-pay | Admitting: Physician Assistant

## 2015-07-11 ENCOUNTER — Ambulatory Visit (INDEPENDENT_AMBULATORY_CARE_PROVIDER_SITE_OTHER): Payer: Medicare Other | Admitting: Physician Assistant

## 2015-07-11 VITALS — BP 120/50 | HR 78 | Ht 63.0 in | Wt 136.0 lb

## 2015-07-11 DIAGNOSIS — I5043 Acute on chronic combined systolic (congestive) and diastolic (congestive) heart failure: Secondary | ICD-10-CM | POA: Diagnosis not present

## 2015-07-11 DIAGNOSIS — I429 Cardiomyopathy, unspecified: Secondary | ICD-10-CM | POA: Diagnosis not present

## 2015-07-11 DIAGNOSIS — I2510 Atherosclerotic heart disease of native coronary artery without angina pectoris: Secondary | ICD-10-CM | POA: Diagnosis not present

## 2015-07-11 DIAGNOSIS — I10 Essential (primary) hypertension: Secondary | ICD-10-CM

## 2015-07-11 DIAGNOSIS — I471 Supraventricular tachycardia, unspecified: Secondary | ICD-10-CM

## 2015-07-11 DIAGNOSIS — I25119 Atherosclerotic heart disease of native coronary artery with unspecified angina pectoris: Secondary | ICD-10-CM

## 2015-07-11 DIAGNOSIS — I350 Nonrheumatic aortic (valve) stenosis: Secondary | ICD-10-CM | POA: Diagnosis not present

## 2015-07-11 MED ORDER — POTASSIUM CHLORIDE CRYS ER 20 MEQ PO TBCR: 20.0000 meq | EXTENDED_RELEASE_TABLET | Freq: Two times a day (BID) | ORAL | Status: DC

## 2015-07-11 MED ORDER — FUROSEMIDE 40 MG PO TABS: 60.0000 mg | ORAL_TABLET | Freq: Every morning | ORAL | Status: DC

## 2015-07-11 MED ORDER — FUROSEMIDE 40 MG PO TABS: 40.0000 mg | ORAL_TABLET | Freq: Two times a day (BID) | ORAL | Status: DC

## 2015-07-11 NOTE — Addendum Note (Signed)
Addended by: Dennie Fetters on: 07/11/2015 02:46 PM   Modules accepted: Orders

## 2015-07-11 NOTE — Telephone Encounter (Addendum)
**Note De-Identified Kenyona Rena Obfuscation** The pt is advised and she verbalized understanding. Rx's for Lasix and Potassium sent to [facility] in [facility] to fill per the pts request. Also, the pt wants to have her labs drawn at her PCP's office (Dr Martinique) as it is closer to her home. She was given an order at [facility] with Richardson Dopp, PA-c to have a BMET drawn at her PCP's office next Friday. I called and spoke with Lattie Haw at Dr [facility] office and asked if they will draw a BNP as well as the BMET. Lattie Haw states that she will call us back to let us know if Dr Martinique will order and if not we will fax over an order.

## 2015-07-11 NOTE — Progress Notes (Signed)
Labs received from PCP dated 07/10/15 Creatinine 0.85, K 3.5, BNP 2050, Hgb 9.2 Lasix will be changed to 40 mg twice a day. Potassium will be added 20 mEq twice a day. Obtain BMET, BNP 1 week. Richardson Dopp, PA-C   07/11/2015 2:03 PM

## 2015-07-11 NOTE — Telephone Encounter (Signed)
Please tell patient that her lab work from PCP indicates significant amounts of fluid.  I want her to take Lasix 40 mg Twice daily.  She should do this unless we tell her to reduce the dose.  Start K+ 20 mEq Twice daily.  Check BMET, BNP 1 week.  Richardson Dopp, PA-C   07/11/2015 2:04 PM

## 2015-07-11 NOTE — Patient Instructions (Signed)
**Note De-Identified Kyrstal Monterrosa Obfuscation** Medication Instructions:  Take Lasix 40 mg twice daily for 3 days then increase to 60 mg daily there after  Labwork: In 1 week (BMET)  Testing/Procedures: None  Follow-Up: Your physician recommends that you schedule a follow-up appointment in: 2 months

## 2015-07-14 ENCOUNTER — Telehealth: Payer: Self-pay | Admitting: Physician Assistant

## 2015-07-14 NOTE — Telephone Encounter (Signed)
Thank you Richardson Dopp, PA-C   07/14/2015 5:06 PM

## 2015-07-14 NOTE — Telephone Encounter (Signed)
New message      Want nurse to know that they will draw BNP and BMET on Friday.  They will fax results to [place]

## 2015-07-14 NOTE — Telephone Encounter (Signed)
Will forward to Richardson Dopp, PA-C and his covering CMA, Julaine Hua, to make them aware that pt's PCP will draw labs on Friday and fax them over to our office.

## 2015-07-18 ENCOUNTER — Other Ambulatory Visit: Payer: Medicare Other

## 2015-07-22 ENCOUNTER — Encounter: Payer: Self-pay | Admitting: Cardiology

## 2015-07-30 IMAGING — CT CT ANGIO CHEST
1 of 2 series · 19 of 32 positions shown · IV contrast (OMNIPAQUE 300)
Comparison: 12/12/2012.

EXAM:
CT ANGIOGRAPHY CHEST WITH CONTRAST
TECHNIQUE: Multidetector CT imaging of the chest was performed using the
standard protocol during bolus administration of intravenous
contrast. Multiplanar CT image reconstructions and MIPs were
obtained to evaluate the vascular anatomy.

CONTRAST:  100mL OMNIPAQUE IOHEXOL 350 MG/ML SOLN

[Series 6: thins for pacs · axial · 0.60mm/px · z∈[+82,+288]mm · 19 of 230 slices shown]
[im 12/230  lung]
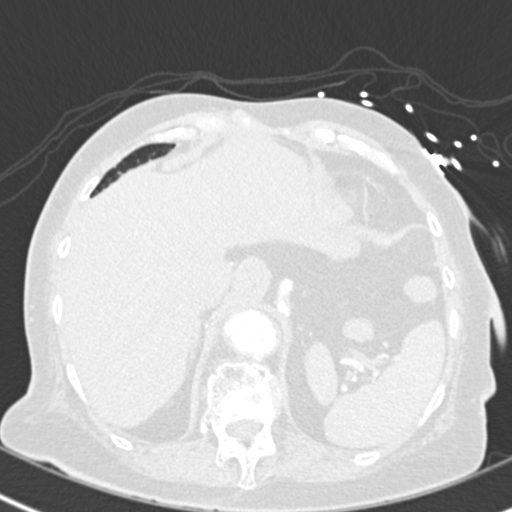
[im 23/230  mediastinal]
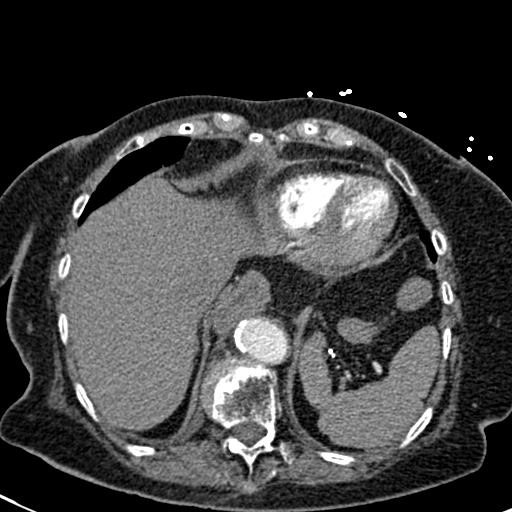
[im 35/230  lung]
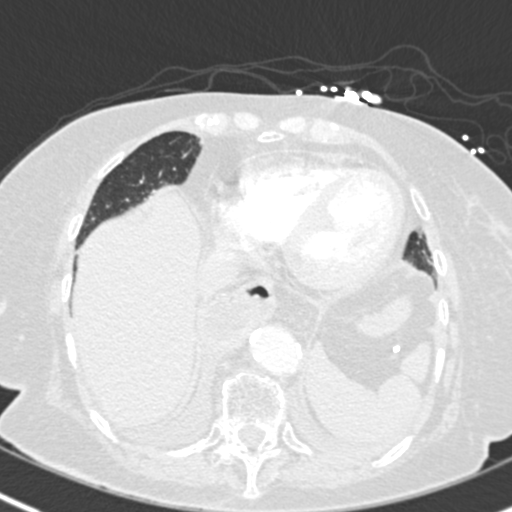
[im 58/230  mediastinal]
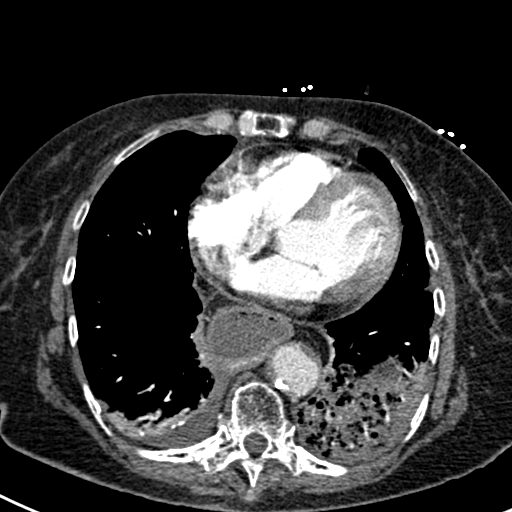
[im 69/230  lung]
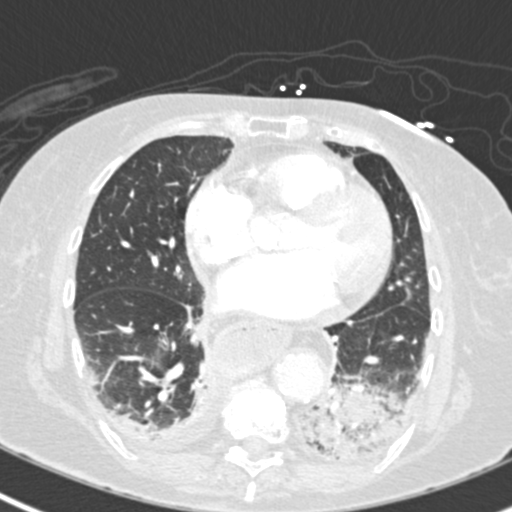
[im 77/230  mediastinal]
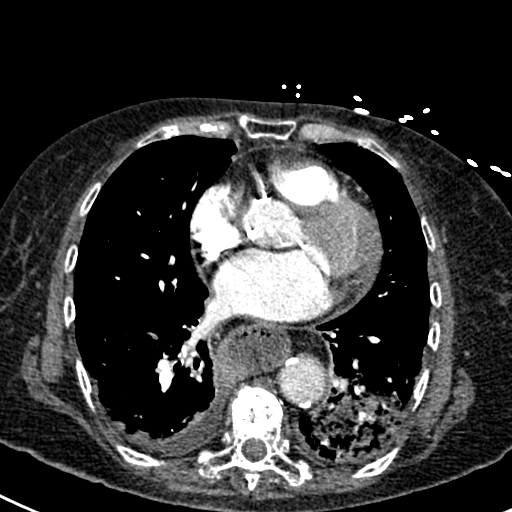
[im 81/230  lung]
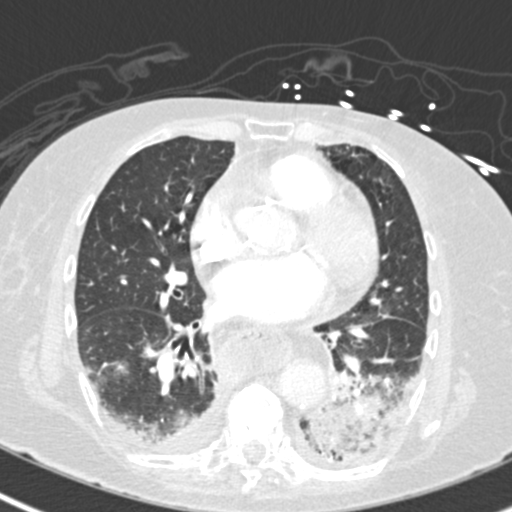
[im 92/230  mediastinal]
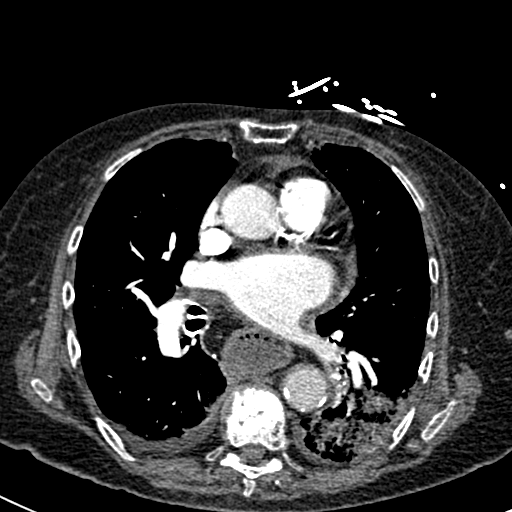
[im 104/230  lung]
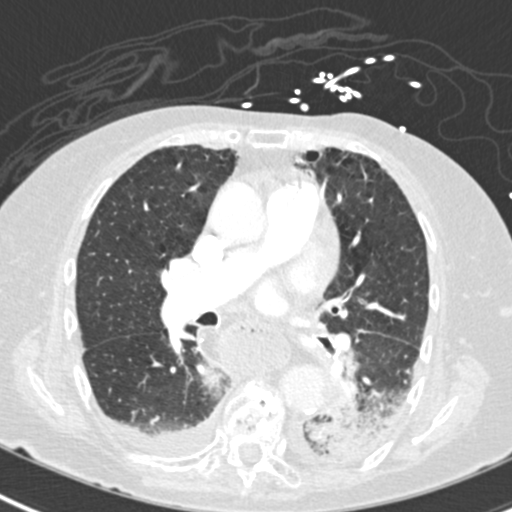
[im 115/230  mediastinal]
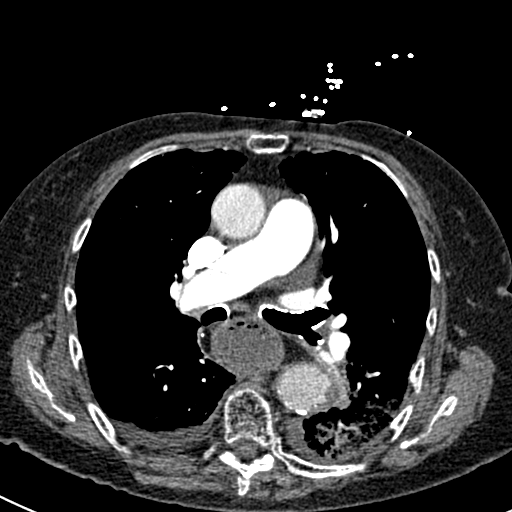
[im 126/230  lung]
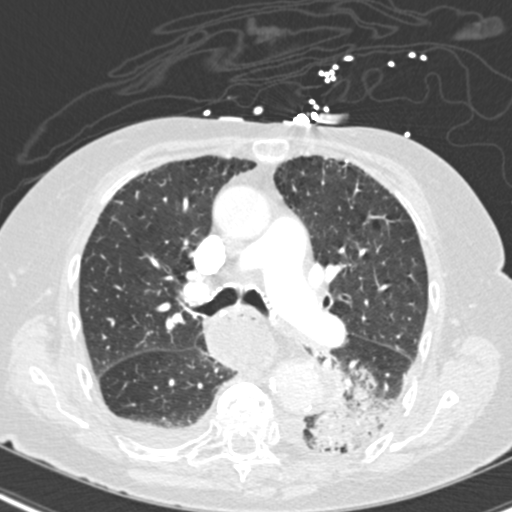
[im 138/230  mediastinal]
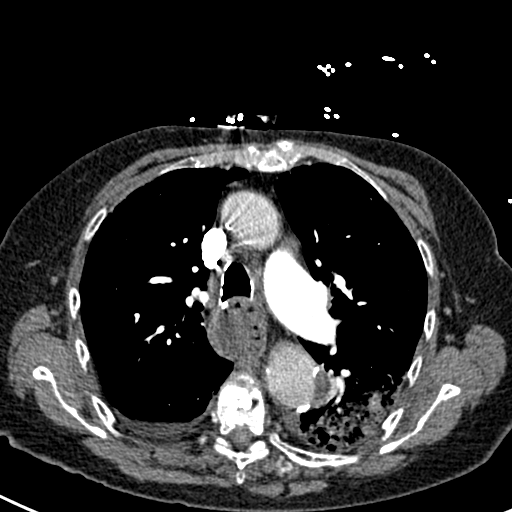
[im 149/230  lung]
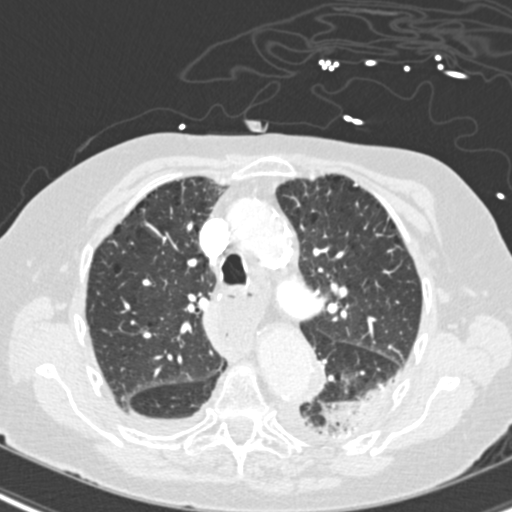
[im 153/230  mediastinal]
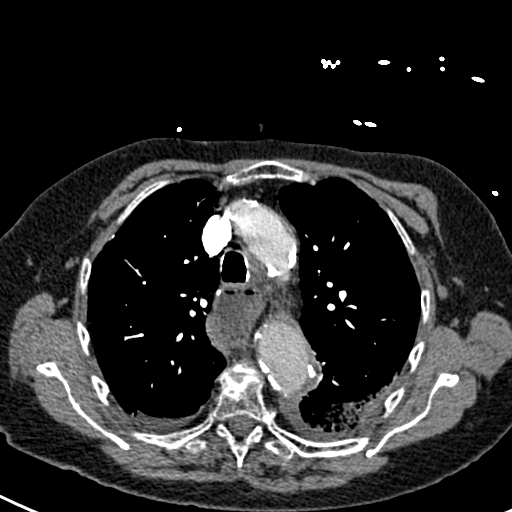
[im 161/230  lung]
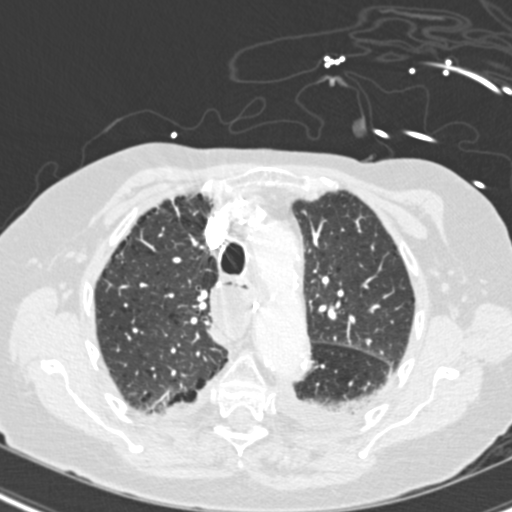
[im 172/230  mediastinal]
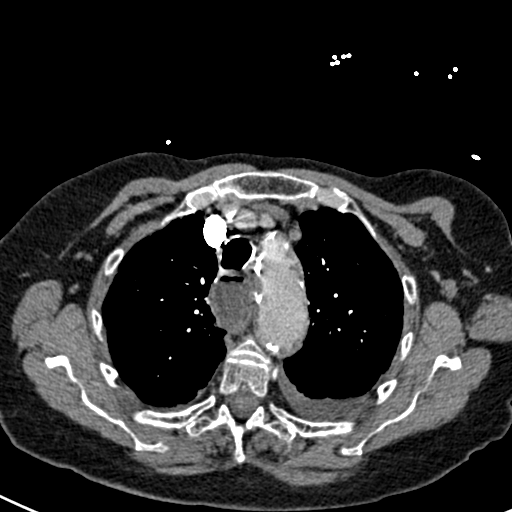
[im 195/230  lung]
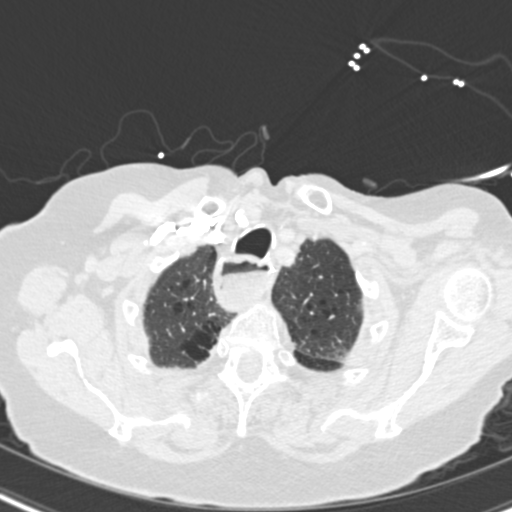
[im 207/230  mediastinal]
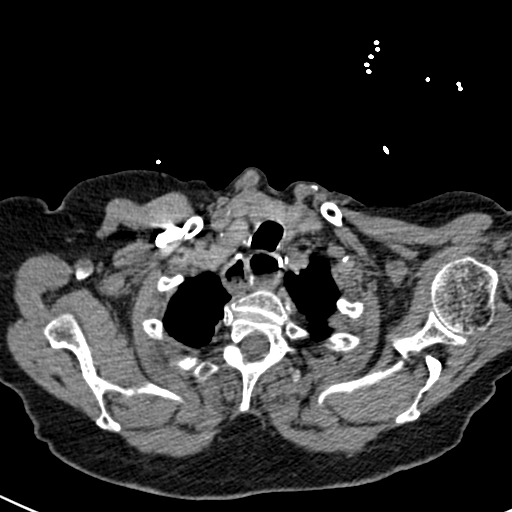
[im 218/230  lung]
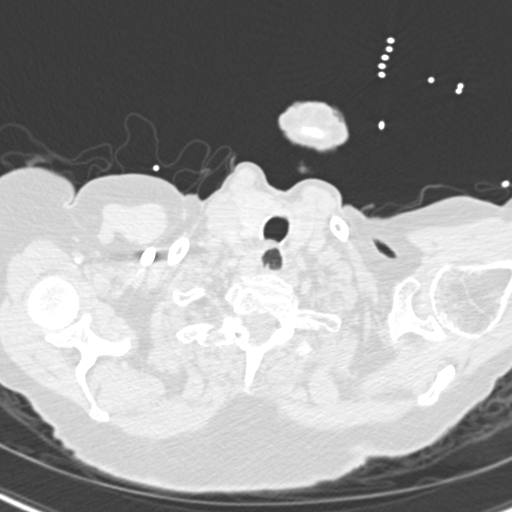

[19 of 32 positions shown; findings below may reference images not displayed]

FINDINGS: No evidence for pulmonary emboli. Atheromatous change transverse
arch. Sequelae of gastric pull-through, with dilated fluid-filled
gastric pull up.

Atherosclerotic changes greatest at the descending thoracic cord
are. Coronary artery calcifications with cardiomegaly. Slight
pericardial effusion.

Moderate bilateral pleural effusions. COPD. Tiny bilateral pulmonary
nodules) image 39, 41) noted previously not calcified.

Extensive left lower lobe consolidation involving the superior
segment and posterior basal segment could represent aspiration.
Air-fluid level as seen on image 29 series 7 could represent
pulmonary abscess versus layering fluid within an air cyst. Minor
infiltrates or atelectatic changes right base.

Chronic compression deformity T5. Treated vertebral body compression
fractures with vertebral augmentation at T6, T8, and T9. No
worrisome osseous lesions.

Upper abdominal organs unremarkable.

Review of the MIP images confirms the above findings.
IMPRESSION: Extensive left lower lobe consolidation involving superior segment
and posterior basal segments could represent aspiration pneumonia in
this patient with a gastric pull-through and a dilated fluid-filled
gastric pull up.

Air-fluid level could represent pulmonary abscess versus layering
fluid within air cyst.

Bilateral pleural effusions.

No evidence for pulmonary emboli.

## 2015-07-30 IMAGING — CR DG CHEST 1V PORT
1 series · 1 of 1 positions shown · non-contrast
Comparison: 02/07/2014.

CLINICAL DATA: Weakness and shortness of breath.  COPD.

EXAM:
PORTABLE CHEST - 1 VIEW

[AP]
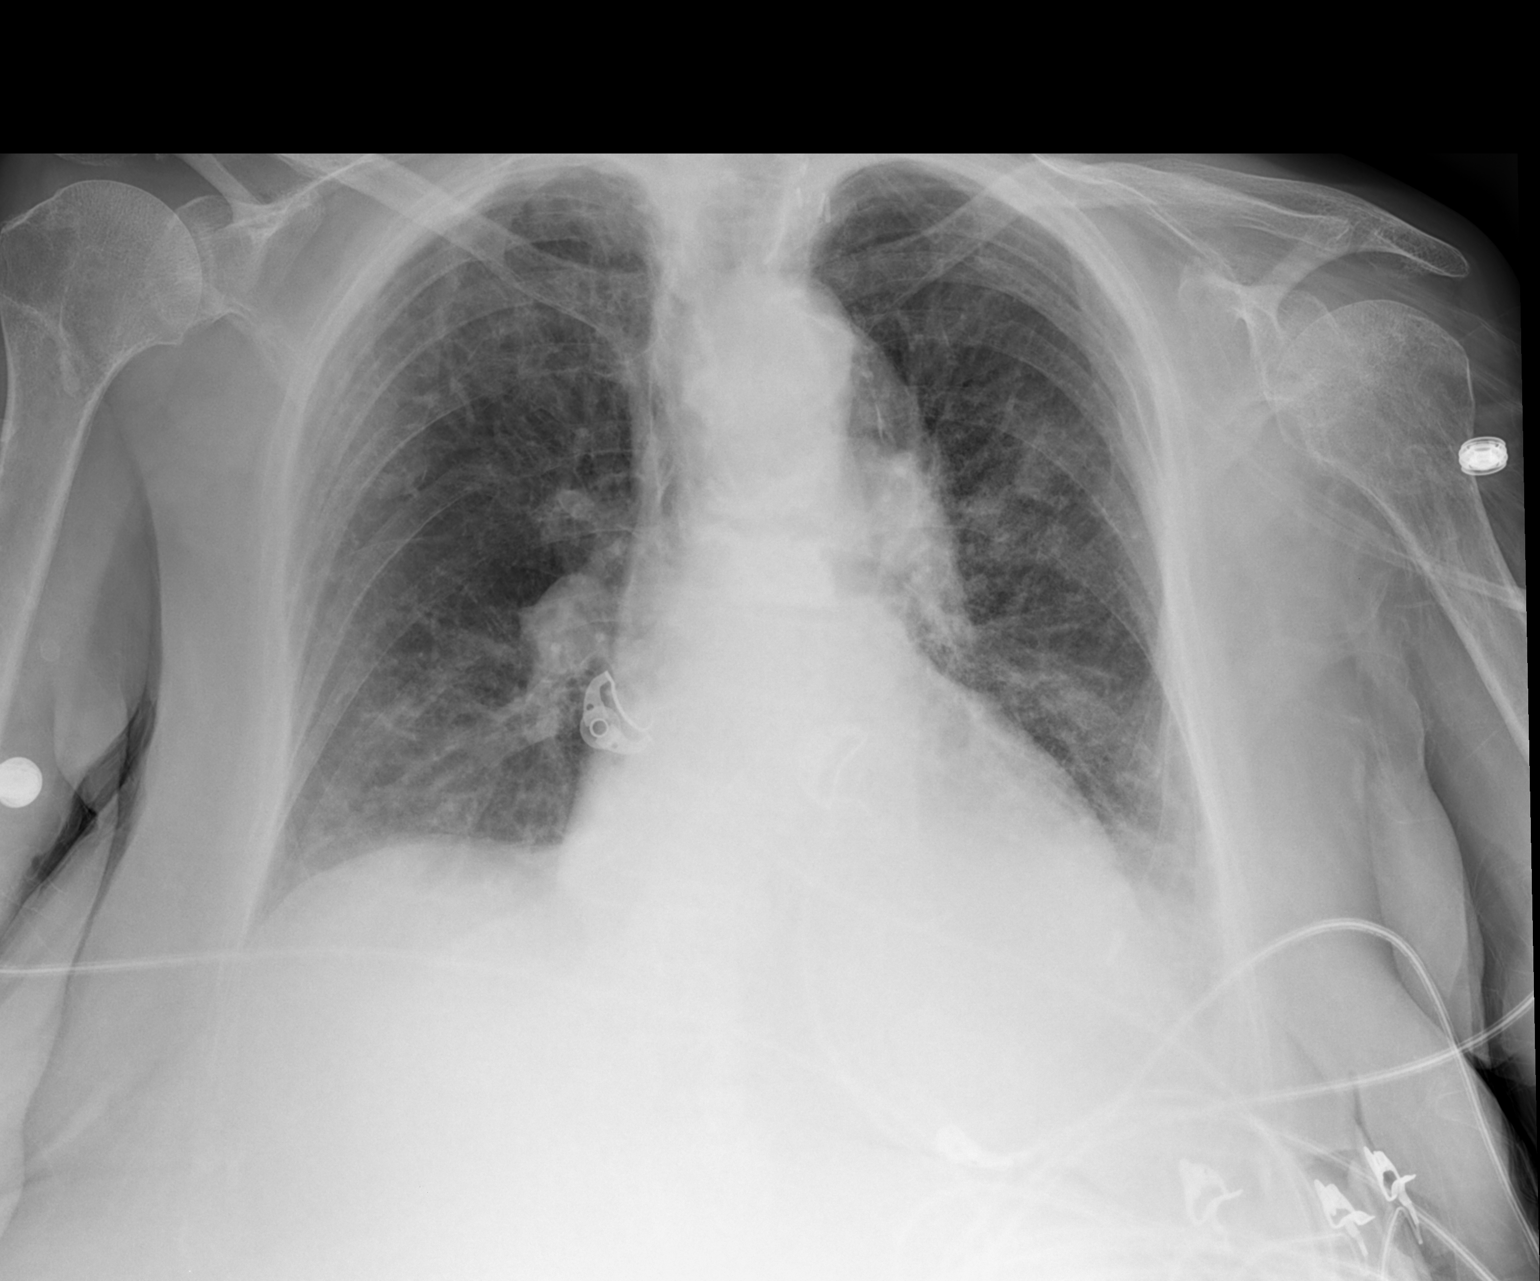

[1 of 1 positions shown; findings below may reference images not displayed]

FINDINGS: Cardiomegaly persists. Calcified tortuous aorta. Mild vascular
congestion. Slight left pleural effusion. Osteopenia.
IMPRESSION: Cardiomegaly with mild vascular congestion. Small left effusion.
Worsening aeration from priors.

## 2015-08-07 ENCOUNTER — Encounter: Payer: Self-pay | Admitting: Cardiology

## 2015-08-27 ENCOUNTER — Telehealth: Payer: Self-pay | Admitting: Oncology

## 2015-08-27 NOTE — Telephone Encounter (Signed)
Pt called lft msg to r/s MD visit she was sick, s/w pt confirming new D/T for MD visit, mailed out schedule... KJ

## 2015-08-28 ENCOUNTER — Ambulatory Visit: Payer: Medicare Other | Admitting: Oncology

## 2015-09-10 ENCOUNTER — Ambulatory Visit: Payer: Medicare Other | Admitting: Cardiology

## 2015-09-22 ENCOUNTER — Ambulatory Visit (HOSPITAL_BASED_OUTPATIENT_CLINIC_OR_DEPARTMENT_OTHER): Payer: Medicare Other | Admitting: Oncology

## 2015-09-22 VITALS — BP 121/63 | HR 68 | Temp 98.4°F | Resp 17 | Ht 63.0 in | Wt 125.2 lb

## 2015-09-22 DIAGNOSIS — Z8501 Personal history of malignant neoplasm of esophagus: Secondary | ICD-10-CM | POA: Diagnosis not present

## 2015-09-22 DIAGNOSIS — C160 Malignant neoplasm of cardia: Secondary | ICD-10-CM

## 2015-09-22 NOTE — Progress Notes (Signed)
[place] OFFICE PROGRESS NOTE   Diagnosis: Esophagus cancer  INTERVAL HISTORY:   [name] returns as scheduled. She fractured her humerus in June and underwent surgical repair. She has returned home after a rehabilitation stay. Ms. Priestly has a cough and dyspnea. She has intermittent angina. She is now oxygen dependent. She has intermittent "strangling "episodes with solids or liquids. Her appetite is diminished. No bleeding.  Objective:  Vital signs in last 24 hours:  Blood pressure 121/63, pulse 68, temperature 98.4 F (36.9 C), temperature source Oral, resp. rate 17, height 5\' 3"  (1.6 m), weight 125 lb 3.2 oz (56.79 kg), SpO2 98 %.    HEENT: Neck without mass Lymphatics: No cervical, supraclavicular, or axillary nodes Resp: Distant breath sounds, no respiratory distress Cardio: Regular rate and rhythm, 2/6 systolic murmur GI: Ventral hernia, no hepatomegaly, no mass Vascular: No leg edema Musculoskeletal: Marked kyphosis       Lab Results:  Lab Results  Component Value Date   WBC 17.8* 05/27/2015   HGB 9.9* 05/27/2015   HCT 30.8* 05/27/2015   MCV 81.0 05/27/2015   PLT 475.0* 05/27/2015   NEUTROABS 15.9* 05/27/2015      Lab Results  Component Value Date   CEA 0.9 08/17/2011    Medications: I have reviewed the patient's current medications.  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 Staging CT scan showed no evidence for distant metastatic disease. Staging PET scan 08/20/2011 revealed hypermetabolic activity at the distal esophagus and no evidence for metastatic disease. She completed radiation 08/26/2011 through 10/04/2011 and weekly Taxol/carboplatin chemotherapy 08/31/2011 through 10/04/2011. -restaging PET scan 11/05/2011 confirmed a decrease in the FDG activity at the distal esophagus with no evidence for  metastatic disease. Status post esophagogastrectomy 12/20/2011 with pathology showing invasive adenocarcinoma, moderately differentiated, scattered foci, the largest spanning 0.26 cm; adenocarcinoma involved the muscularis mucosa; the surgical resection margins were negative; no evidence of carcinoma in 9 of 9 lymph nodes. Restaging CT scans 12/12/2012 showed no evidence of recurrent or metastatic disease.  #2 Solid/liquid dysphagia, subxiphoid discomfort, and weight loss secondary to esophagus cancer. There was initial improvement in the dysphagia. She had recurrent dysphagia. Esophagram on 09/18/2012 revealed no mass or obstruction. Poor motility was noted in the gastric pull-through. She has persistent solid dysphagia.  #3 Subserosal gastrointestinal stromal tumor, low-grade, spanning 0.8 cm noted on pathology 12/20/2011.  #4 Status post left total hip replacement 06/30/2012.  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 Resolved.  #6 Chronic anemia with a history of chronic gastrointestinal blood loss. The hemoglobin was in normal range on 03/16/2013  #7 Osteoarthritis.  #8 "Pernicious" anemia.  #9 Fibromyalgia.  #10 Chronic "calcific" cutaneous lesions.  #11 Fever and erythema with associated tenderness involving one of the calcific cutaneous lesions at the left anterior lateral leg on 09/21/2011. Improved following Keflex therapy.  #12 Multiple medication allergies.  #13 Hypertension.  #14 Superficial phlebitis right arm on 10/18/2011.  #15 Abdominal wall/incisional hernia.  #16 Compression fracture at T8 and T9, likely osteoporotic, status post a T8 vertebroplasty 01/10/2013. T9 vertebroplasty 01/29/2013  #17 status post a referral to the urology Center for evaluation of flank pain  #18 "reaction "to IV iron in March and April of 2014.  #19 Anemia. She has received IV iron in the past. Most recently she had "allergic reactions" to IV iron   #20 COPD #21 CAD #22  left femurs fracture June 2016  Disposition:  Ms. Colegrove has a remote history of esophagus cancer. She remains in clinical remission. She has multiple comorbid conditions. She is followed closely by Dr. Martinique. She reports Dr. Martinique we'll check her hemoglobin later this week.  Ms. Ormiston is not scheduled for a follow-up appointment at the [facility]. I'm available to see her in the future as needed.  Betsy Coder, MD  09/22/2015  1:04 PM

## 2015-09-23 ENCOUNTER — Encounter: Payer: Self-pay | Admitting: Cardiology

## 2015-09-23 ENCOUNTER — Ambulatory Visit (INDEPENDENT_AMBULATORY_CARE_PROVIDER_SITE_OTHER): Payer: Medicare Other | Admitting: Cardiology

## 2015-09-23 VITALS — BP 120/58 | HR 70 | Ht 63.0 in | Wt 125.0 lb

## 2015-09-23 DIAGNOSIS — I10 Essential (primary) hypertension: Secondary | ICD-10-CM | POA: Diagnosis not present

## 2015-09-23 DIAGNOSIS — I350 Nonrheumatic aortic (valve) stenosis: Secondary | ICD-10-CM

## 2015-09-23 DIAGNOSIS — I5042 Chronic combined systolic (congestive) and diastolic (congestive) heart failure: Secondary | ICD-10-CM

## 2015-09-23 DIAGNOSIS — I2510 Atherosclerotic heart disease of native coronary artery without angina pectoris: Secondary | ICD-10-CM

## 2015-09-23 DIAGNOSIS — I420 Dilated cardiomyopathy: Secondary | ICD-10-CM | POA: Diagnosis not present

## 2015-09-23 DIAGNOSIS — I2583 Coronary atherosclerosis due to lipid rich plaque: Principal | ICD-10-CM

## 2015-09-23 DIAGNOSIS — E785 Hyperlipidemia, unspecified: Secondary | ICD-10-CM

## 2015-09-23 MED ORDER — ISOSORBIDE MONONITRATE ER 60 MG PO TB24: 30.0000 mg | ORAL_TABLET | Freq: Every day | ORAL | Status: DC

## 2015-09-23 MED ORDER — ISOSORBIDE MONONITRATE ER 60 MG PO TB24: 60.0000 mg | ORAL_TABLET | Freq: Every day | ORAL | Status: DC

## 2015-09-23 NOTE — Patient Instructions (Signed)
Medication Instructions:  Your physician has recommended you make the following change in your medication:  1) INCREASE IMDUR to 60 mg daily  Labwork: None  Testing/Procedures: None  Follow-Up: Your physician recommends that you schedule a follow-up appointment in: 1 months with an APP.  Your physician wants you to follow-up in: 6 months with Dr. Radford Pax. You will receive a reminder letter in the mail two months in advance. If you don't receive a letter, please call our office to schedule the follow-up appointment.   Any Other Special Instructions Will Be Listed Below (If Applicable).

## 2015-09-23 NOTE — Progress Notes (Signed)
Cardiology Office Note   Date:  09/23/2015   ID:  [name], DOB March 21, 1934, MRN [id number]  PCP:  Martinique, [name], MD    No chief complaint on file.     History of Present Illness: [name] is a 81 y.o. female with a hx of HTN, diastolic HF, GERD, pernicious anemia, prior GI bleed, esophageal adenocarcinoma, COPD, remote history of tobacco abuse. She also has severe multivessel CAD including significant ost LM and mid RCA disease which would be best approached by bypass surgery. However she was felt to be a poor candidate for CABG given her advanced age, severe COPD, chronic steroid-induced use and poor mobility. High risk PCI could be considered, however cardiac catheterization showed severe left main disease that is very difficult to approach by PCI given ostial location. Echo demonstrated EF 09-73%, grade 2 diastolic dysfunction, moderate aortic stenosis with mean gradient 20 mmHg, moderately dilated LA and PASP 46 mmHg. She was seen by Dr. Roxan Hockey of cardiothoracic surgery team and it was felt that her risk associated with surgery is prohibitive. After review by interventional cardiology, med Rx was recommended.   She is here by herself today.. She is currently at home.  She is on chronic O2. She sleeps on 3 pillows chronically more for comfort than breathing.  Marland KitchenHer SOB is stable and has not changed recently.   She denies PND. She has occasional episodes of left arm pain radiating into her jaw and teeth that are consistent with angina. It was occurring several times daily but her PCP placed her on Imdur which has helped.  She occasionally has a skipped beat once and a while.  She denies any LE edema.    Past Medical History  Diagnosis Date  . Fibromyalgia   . Skin lesion     chronic calcific cutaneous lesions  . HX of multiple bleeding ulcers 09/01/2011  . Hypertension     takes Atenolol daily  . Emphysema   . Dizziness     d/t crystals in  ears that "roll around"and can't get them out  . Osteoporosis   . Scoliosis   . Psoriasis   . H/O hiatal hernia   . GERD (gastroesophageal reflux disease)     takes Aciphex daily  . Gastric ulcer   . Constipation     since Chemo and Radiation;finished up in Nov 2012  . Diverticulosis   . History of colonic polyps   . Shingles     broke out 2wks ago;has been on meds and to finish up tomorrow  . Vitamin B deficiency   . COPD (chronic obstructive pulmonary disease) ([place])   . On home oxygen therapy     "2L;  24/7" (05/14/2015)  . Left humeral fracture 04/26/2015  . Scoliosis   . Complication of anesthesia     shortness of breath and pain with gas   . Exertional shortness of breath   . Pneumonia 2015 X 3  . Chronic bronchitis ([place])     "I about keep it"  . History of blood transfusion     "related to my anemia"  . Osteoarthritis   . Arthritis     "qwhere" (05/14/2015)  . Chronic upper back pain   . Kidney stone 25+yrs ago    "passed them"  . Adenocarcinoma of gastroesophageal junction ([place])   . Pernicious anemia   . Iron deficiency anemia   .  Anemia, B12 deficiency     "get shots once/month" (05/14/2015)  . DCM (dilated cardiomyopathy) ([place])     EF 40-45%  . Chronic combined systolic (congestive) and diastolic (congestive) heart failure ([place])   . Coronary artery disease     Severe 3 vessel ASCAD felt to be a poor candidate for CABG given her advanced age, severe COPD, chronic steroid-induced use and poor mobility  . Aortic stenosis     moderate  . Hyperlipidemia     Past Surgical History  Procedure Laterality Date  . Total hip arthroplasty Right 2001  . Tonsillectomy      as a child  . Tubal ligation  1975  . Appendectomy  1975  . Cataract extraction w/ intraocular lens  implant, bilateral Bilateral 10+yrs ago  . Esophagogastroduodenoscopy    . Colonoscopy    . Partial esophagectomy  12/20/2011    Procedure: ESOPHAGECTOMY PARTIAL;  Surgeon: Pierre Bali, MD;   Location: [facility];  Service: Thoracic;  Laterality: N/A;  . Total hip arthroplasty  06/30/2012    Procedure: TOTAL HIP ARTHROPLASTY ANTERIOR APPROACH;  Surgeon: Mcarthur Rossetti, MD;  Location: [facility];  Service: Orthopedics;  Laterality: Left;  Left total hip arthroplasty  . Shoulder closed reduction  04/26/2015    Procedure: CLOSED MANIPULATION SHOULDER;  Surgeon: Meredith Pel, MD;  Location: [facility];  Service: Orthopedics;;  . Cardiac catheterization N/A 05/14/2015    Procedure: Right/Left Heart Cath and Coronary Angiography;  Surgeon: Burnell Blanks, MD;  Location: [facility];  Service: Cardiovascular;  Laterality: N/A;  . Joint replacement    . Dilation and curettage of uterus    . Back surgery    . Fixation kyphoplasty thoracic spine  X 3    "broke my back in 6 places related to fall/coughing; 3 different cases"  . Orif humerus fracture Left 05/15/2015    Procedure: Left OPEN REDUCTION INTERNAL FIXATION (ORIF) PROXIMAL HUMERUS FRACTURE;  Surgeon: Meredith Pel, MD;  Location: [facility];  Service: Orthopedics;  Laterality: Left;     Current Outpatient Prescriptions  Medication Sig Dispense Refill  . acetaminophen (TYLENOL) 500 MG tablet Take 2 tablets (1,000 mg total) by mouth 3 (three) times daily. (Patient taking differently: Take 1,000 mg by mouth daily. ) 30 tablet 0  . albuterol (PROVENTIL HFA;VENTOLIN HFA) 108 (90 BASE) MCG/ACT inhaler Inhale 2 puffs into the lungs every 4 (four) hours as needed for wheezing (wheezing).    Marland Kitchen albuterol (PROVENTIL) (2.5 MG/3ML) 0.083% nebulizer solution Take 3 mLs (2.5 mg total) by nebulization every 2 (two) hours as needed for wheezing or shortness of breath. 75 mL 12  . aspirin EC 81 MG tablet Take 81 mg by mouth every morning.     Marland Kitchen atenolol (TENORMIN) 50 MG tablet Take 50 mg by mouth 2 (two) times daily.    Marland Kitchen atorvastatin (LIPITOR) 80 MG tablet Take 1 tablet (80 mg total) by mouth daily at 6 PM. 30 tablet 11  . clopidogrel  (PLAVIX) 75 MG tablet Take 1 tablet (75 mg total) by mouth daily. 30 tablet 11  . cyanocobalamin (,VITAMIN B-12,) 1000 MCG/ML injection Inject 1,000 mcg into the muscle every 30 (thirty) days.     . ergocalciferol (VITAMIN D2) 50000 UNITS capsule Take 50,000 Units by mouth every 14 (fourteen) days.     Marland Kitchen esomeprazole (NEXIUM) 20 MG capsule Take 20 mg by mouth daily at 12 noon.    . Fluticasone Furoate-Vilanterol 200-25 MCG/INH AEPB Inhale  2 puffs into the lungs every morning.    . furosemide (LASIX) 40 MG tablet Take 1 tablet (40 mg total) by mouth 2 (two) times daily. 60 tablet 3  . guaiFENesin (MUCINEX) 600 MG 12 hr tablet Take 1 tablet (600 mg total) by mouth 2 (two) times daily.    . hydroxypropyl methylcellulose (ISOPTO TEARS) 2.5 % ophthalmic solution Place 1 drop into both eyes 3 (three) times daily as needed for dry eyes.    Marland Kitchen ibuprofen (ADVIL,MOTRIN) 200 MG tablet Take 400 mg by mouth daily as needed.    Marland Kitchen ipratropium-albuterol (DUONEB) 0.5-2.5 (3) MG/3ML SOLN Take 3 mLs by nebulization every 6 (six) hours. 360 mL   . isosorbide mononitrate (IMDUR) 30 MG 24 hr tablet Take 30 mg by mouth daily.    Marland Kitchen loratadine (CLARITIN) 10 MG tablet Take 10 mg by mouth every morning.     Marland Kitchen LORazepam (ATIVAN) 0.5 MG tablet Take 1 tablet (0.5 mg total) by mouth every 8 (eight) hours as needed for anxiety. 14 tablet 0  . losartan (COZAAR) 25 MG tablet Take 25 mg by mouth daily.  2  . nitroGLYCERIN (NITROSTAT) 0.4 MG SL tablet Place 1 tablet (0.4 mg total) under the tongue every 5 (five) minutes as needed for chest pain. 25 tablet 3  . polyethylene glycol (MIRALAX / GLYCOLAX) packet Take 17 g by mouth daily. (Patient taking differently: Take 17 g by mouth daily as needed. ) 14 each 0  . potassium chloride SA (K-DUR,KLOR-CON) 20 MEQ tablet Take 1 tablet (20 mEq total) by mouth 2 (two) times daily. (Patient taking differently: Take 20 mEq by mouth daily. ) 60 tablet 3  . predniSONE (DELTASONE) 1 MG tablet Take 2  mg by mouth daily with breakfast.    . saccharomyces boulardii (FLORASTOR) 250 MG capsule Take 1 capsule (250 mg total) by mouth 2 (two) times daily. (Patient taking differently: Take 250 mg by mouth daily. ) 30 capsule 0  . senna (SENOKOT) 8.6 MG TABS tablet Take 1 tablet (8.6 mg total) by mouth at bedtime. 120 each 0  . sodium chloride (OCEAN) 0.65 % SOLN nasal spray Place 1 spray into both nostrils as needed for congestion. 15 mL 0  . ST JOHNS WORT PO Take 1 capsule by mouth 2 (two) times daily.    Marland Kitchen tiZANidine (ZANAFLEX) 4 MG tablet Take 0.5-1 tablets (2-4 mg total) by mouth every 8 (eight) hours as needed for muscle spasms. 30 tablet 0   No current facility-administered medications for this visit.    Allergies:   Keflex; Nulecit; Biaxin; Spiriva handihaler; Tramadol; Adhesive; Clarithromycin; Darvon; Epinephrine; Fentanyl; Morphine and related; Oxycodone; Sulfa antibiotics; and Vicodin    Social History:  The patient  reports that she quit smoking about 23 years ago. Her smoking use included Cigarettes. She has a 135 pack-year smoking history. She has never used smokeless tobacco. She reports that she does not drink alcohol or use illicit drugs.   Family History:  The patient's family history includes Breast cancer in her paternal aunt, paternal aunt, and paternal aunt; Cancer in her maternal aunt and maternal grandmother. There is no history of Anesthesia problems, Hypotension, Malignant hyperthermia, or Pseudochol deficiency.    ROS:  Please see the history of present illness.   Otherwise, review of systems are positive for none.   All other systems are reviewed and negative.    PHYSICAL EXAM: VS:  BP 120/58 mmHg  Pulse 70  Ht 5\' 3"  (1.6 m)  Wt 125 lb (56.7 kg)  BMI 22.15 kg/m2  SpO2 98% , BMI Body mass index is 22.15 kg/(m^2). GEN: Well nourished, well developed, in no acute distress HEENT: normal Neck: no JVD, carotid bruits, or masses Cardiac: RRR; no murmurs, rubs, or  gallops,no edema  Respiratory:  clear to auscultation bilaterally, normal work of breathing GI: soft, nontender, nondistended, + BS MS: no deformity or atrophy Skin: warm and dry, no rash Neuro:  Strength and sensation are intact Psych: euthymic mood, full affect   EKG:  EKG is not ordered today.    Recent Labs: 04/29/2015: B Natriuretic Peptide 707.5* 05/08/2015: Pro B Natriuretic peptide (BNP) 469.0* 05/27/2015: BUN 38*; Creatinine, Ser 1.37*; Hemoglobin 9.9*; Platelets 475.0*; Potassium 4.5; Sodium 125*    Lipid Panel No results found for: CHOL, TRIG, HDL, CHOLHDL, VLDL, LDLCALC, LDLDIRECT    Wt Readings from Last 3 Encounters:  09/23/15 125 lb (56.7 kg)  09/22/15 125 lb 3.2 oz (56.79 kg)  07/11/15 136 lb (61.689 kg)    ASSESSMENT AND PLAN:  1.  Severe 3 vessel ASCAD felt to be a poor candidate for CABG given her advanced age, severe COPD, chronic steroid-induced use and poor mobility. High risk PCI could be considered, however cardiac catheterization showed severe left main disease that is very difficult to approach by PCI given ostial location. Currently on medical therapy  She was having increased anginal pain that now has improved with addition of Imdur by her PCP.  I will increase the Imdur to 60mg  daily to see if we can keep her pain free.  Continue ASA/Plavix/BB/statin 2. HTN - controlled on BB/ARB 3. COPD 4.  Moderate AS by recent echo 5. Chronic combined systolic/diastolic CHF (EF 43-88%) - appears euvolemic - continue diuretic and BB 6.  Dyslipidemia on statin - I will get a copy of lipids from PCP  Current medicines are reviewed at length with the patient today.  The patient does not have concerns regarding medicines.  The following changes have been made:  no change  Labs/ tests ordered today: See above Assessment and Plan No orders of the defined types were placed in this encounter.     Disposition:   FU with me in 6 months and 3 months with  extender  Signed, Sueanne Margarita, MD  09/23/2015 3:16 PM    [facility] [address], [place], [place]  [address] Phone: [phone]; Fax: [phone]

## 2015-10-27 ENCOUNTER — Ambulatory Visit (INDEPENDENT_AMBULATORY_CARE_PROVIDER_SITE_OTHER): Payer: Medicare Other | Admitting: Physician Assistant

## 2015-10-27 ENCOUNTER — Encounter: Payer: Self-pay | Admitting: Physician Assistant

## 2015-10-27 VITALS — BP 120/50 | HR 76 | Ht 63.0 in | Wt 126.0 lb

## 2015-10-27 DIAGNOSIS — I350 Nonrheumatic aortic (valve) stenosis: Secondary | ICD-10-CM

## 2015-10-27 DIAGNOSIS — I5042 Chronic combined systolic (congestive) and diastolic (congestive) heart failure: Secondary | ICD-10-CM | POA: Diagnosis not present

## 2015-10-27 DIAGNOSIS — I10 Essential (primary) hypertension: Secondary | ICD-10-CM

## 2015-10-27 DIAGNOSIS — I2510 Atherosclerotic heart disease of native coronary artery without angina pectoris: Secondary | ICD-10-CM | POA: Diagnosis not present

## 2015-10-27 DIAGNOSIS — E785 Hyperlipidemia, unspecified: Secondary | ICD-10-CM

## 2015-10-27 MED ORDER — NITROGLYCERIN 0.4 MG SL SUBL: 0.4000 mg | SUBLINGUAL_TABLET | SUBLINGUAL | Status: AC | PRN

## 2015-10-27 MED ORDER — LOSARTAN POTASSIUM 50 MG PO TABS: 50.0000 mg | ORAL_TABLET | Freq: Every day | ORAL | Status: AC

## 2015-10-27 NOTE — Progress Notes (Signed)
Cardiology Office Note Date:  10/27/2015  Patient ID:  [name], [name] 08/31/1934, MRN [id number] PCP:  Martinique, [name], MD  Cardiologist:  Dr. Radford Pax   Chief Complaint: f/u CAD, CHF  History of Present Illness: [name] is a 81 y.o. female with history of CAD (severe, treated medically), HTN, chronic combined CHF (EF 40-45%), moderate AS by echo 05/2015, pulm HTN, GERD, pernicious anemia, prior GI bleed, esophageal adenocarcinoma, severe COPD, chronic steroid use, and remote history of tobacco abuse who presents for f/u. She underwent LHC 05/2015 showing severe multivessel CAD including significant ost LM and mid RCA disease which would be best approached by bypass surgery. However she was felt to be a poor candidate for CABG given her advanced age, severe COPD, chronic steroid-induced use and poor mobility. High risk PCI could be considered, however cardiac catheterization showed severe left main disease that is very difficult to approach by PCI given ostial location. She was seen by Dr. Roxan Hockey of cardiothoracic surgery team and it was felt that her risk associated with surgery is prohibitive. After review by interventional cardiology, med Rx was recommended.2D echo 05/12/15: EF 40-45, diffuse HK, grade 2 DD, moderate AS, mild MR, mod LAE, PASP 1mmHg. At last OV 09/2015 her Imdur was increased to 60mg  daily.  She has since seen her PCP for increased anginal symptoms - her Imdur was increased further to 90mg  daily. The patient has been taking this as 1/2 tablet TID (30mg  3x/day -> 90mg  total). She says she had to split this up throughout the day because she felt swimmy headed if she took it all at once. Per her report, PCP also decreased Lasix to 20mg  BID, decreased atenolol to once daily, and increased losartan from 25mg  to 100mg  daily. The patient continues to have frequent jaw pain and arm pain reminiscent of angina. She used to have 8-9 episodes per day, but with increased  Imdur, it's down to 2-3 episodes per day. Even though less frequent, she does report significantly decreased quality of life with these episodes. She is very sedentary and does not do much activity. The episodes tend to occur at random at rest, and also after eating. She has chronic SOB and cough. She has been referred to pulmonology by her PCP and is awaiting this appointment (has never seen them). She says occasionally her BP runs low, but not as low as it used to before PCP changed meds.   Past Medical History  Diagnosis Date  . Fibromyalgia   . Skin lesion     chronic calcific cutaneous lesions  . HX of multiple bleeding ulcers 09/01/2011  . Hypertension   . Emphysema   . Dizziness     d/t crystals in ears that "roll around"and can't get them out  . Osteoporosis   . Scoliosis   . Psoriasis   . H/O hiatal hernia   . GERD (gastroesophageal reflux disease)   . Gastric ulcer   . Diverticulosis   . History of colonic polyps   . Shingles   . Vitamin B deficiency   . COPD (chronic obstructive pulmonary disease) ([place])   . On home oxygen therapy     "2L;  24/7" (05/14/2015)  . Left humeral fracture 04/26/2015  . Scoliosis   . Complication of anesthesia     shortness of breath and pain with gas   . Pneumonia 2015 X 3  . Chronic bronchitis ([place])     "I about keep it"  . History  of blood transfusion     "related to my anemia"  . Osteoarthritis   . Arthritis     "qwhere" (05/14/2015)  . Chronic upper back pain   . Kidney stone 25+yrs ago    "passed them"  . Adenocarcinoma of gastroesophageal junction ([facility])   . Pernicious anemia   . Iron deficiency anemia   . Anemia, B12 deficiency     "get shots once/month" (05/14/2015)  . Chronic combined systolic (congestive) and diastolic (congestive) heart failure ([place])     a. 2D echo 05/12/15: EF 40-45, diffuse HK, grade 2 DD, moderate AS, mild MR, mod LAE, PASP 35mmHg.  Marland Kitchen Coronary artery disease     a. Severe 3 vessel ASCAD felt to be a poor  candidate for CABG given her advanced age, severe COPD, chronic steroid-induced use and poor mobility. Not a good candidate for PCI either.  . Aortic stenosis     a. Mod by echo 05/2015.  Marland Kitchen Hyperlipidemia   . Pulmonary hypertension [facility]     Past Surgical History  Procedure Laterality Date  . Total hip arthroplasty Right 2001  . Tonsillectomy      as a child  . Tubal ligation  1975  . Appendectomy  1975  . Cataract extraction w/ intraocular lens  implant, bilateral Bilateral 10+yrs ago  . Esophagogastroduodenoscopy    . Colonoscopy    . Partial esophagectomy  12/20/2011    Procedure: ESOPHAGECTOMY PARTIAL;  Surgeon: Pierre Bali, MD;  Location: [facility];  Service: Thoracic;  Laterality: N/A;  . Total hip arthroplasty  06/30/2012    Procedure: TOTAL HIP ARTHROPLASTY ANTERIOR APPROACH;  Surgeon: Mcarthur Rossetti, MD;  Location: [facility];  Service: Orthopedics;  Laterality: Left;  Left total hip arthroplasty  . Shoulder closed reduction  04/26/2015    Procedure: CLOSED MANIPULATION SHOULDER;  Surgeon: Meredith Pel, MD;  Location: [facility];  Service: Orthopedics;;  . Cardiac catheterization N/A 05/14/2015    Procedure: Right/Left Heart Cath and Coronary Angiography;  Surgeon: Burnell Blanks, MD;  Location: [facility];  Service: Cardiovascular;  Laterality: N/A;  . Joint replacement    . Dilation and curettage of uterus    . Back surgery    . Fixation kyphoplasty thoracic spine  X 3    "broke my back in 6 places related to fall/coughing; 3 different cases"  . Orif humerus fracture Left 05/15/2015    Procedure: Left OPEN REDUCTION INTERNAL FIXATION (ORIF) PROXIMAL HUMERUS FRACTURE;  Surgeon: Meredith Pel, MD;  Location: [facility];  Service: Orthopedics;  Laterality: Left;    Current Outpatient Prescriptions  Medication Sig Dispense Refill  . acetaminophen (TYLENOL) 500 MG tablet Take 500 mg by mouth daily.    Marland Kitchen albuterol (PROVENTIL HFA;VENTOLIN HFA) 108 (90 BASE)  MCG/ACT inhaler Inhale 2 puffs into the lungs every 4 (four) hours as needed for wheezing (wheezing).    Marland Kitchen albuterol (PROVENTIL) (2.5 MG/3ML) 0.083% nebulizer solution Take 3 mLs (2.5 mg total) by nebulization every 2 (two) hours as needed for wheezing or shortness of breath. 75 mL 12  . aspirin EC 81 MG tablet Take 81 mg by mouth every morning.     Marland Kitchen atenolol (TENORMIN) 50 MG tablet Take 50 mg by mouth daily.     Marland Kitchen atorvastatin (LIPITOR) 80 MG tablet Take 1 tablet (80 mg total) by mouth daily at 6 PM. 30 tablet 11  . clopidogrel (PLAVIX) 75 MG tablet Take 1 tablet (75 mg total) by mouth  daily. 30 tablet 11  . cyanocobalamin (,VITAMIN B-12,) 1000 MCG/ML injection Inject 1,000 mcg into the muscle every 30 (thirty) days.     . ergocalciferol (VITAMIN D2) 50000 UNITS capsule Take 50,000 Units by mouth every 14 (fourteen) days.     Marland Kitchen esomeprazole (NEXIUM) 20 MG capsule Take 20 mg by mouth daily at 12 noon.    . Fluticasone Furoate-Vilanterol 200-25 MCG/INH AEPB Inhale 2 puffs into the lungs every morning.    . furosemide (LASIX) 20 MG tablet Take 20 mg by mouth 2 (two) times daily.     Marland Kitchen guaiFENesin (MUCINEX) 600 MG 12 hr tablet Take 1 tablet (600 mg total) by mouth 2 (two) times daily.    . hydroxypropyl methylcellulose (ISOPTO TEARS) 2.5 % ophthalmic solution Place 1 drop into both eyes 3 (three) times daily as needed for dry eyes.    Marland Kitchen ibuprofen (ADVIL,MOTRIN) 200 MG tablet Take 400 mg by mouth daily as needed (pain).     Marland Kitchen ipratropium-albuterol (DUONEB) 0.5-2.5 (3) MG/3ML SOLN Take 3 mLs by nebulization every 6 (six) hours. 360 mL   . isosorbide mononitrate (IMDUR) 60 MG 24 hr tablet Take 30 mg by mouth 3 (three) times daily.    Marland Kitchen loratadine (CLARITIN) 10 MG tablet Take 10 mg by mouth every morning.     Marland Kitchen LORazepam (ATIVAN) 0.5 MG tablet Take 1 tablet (0.5 mg total) by mouth every 8 (eight) hours as needed for anxiety. 14 tablet 0  . losartan (COZAAR) 100 MG tablet Take 100 mg by mouth daily.    .  nitroGLYCERIN (NITROSTAT) 0.4 MG SL tablet Place 1 tablet (0.4 mg total) under the tongue every 5 (five) minutes as needed for chest pain. 25 tablet 3  . nystatin (MYCOSTATIN) 100000 UNIT/ML suspension TAKE 4-5 ML FOUR TIMES A DAY MOUTH/THROAT 20 DAYS  0  . potassium chloride (KLOR-CON) 20 MEQ packet Take 20 mEq by mouth daily.    . predniSONE (DELTASONE) 1 MG tablet Take 2 mg by mouth daily with breakfast.    . saccharomyces boulardii (FLORASTOR) 250 MG capsule Take 250 mg by mouth daily.    Marland Kitchen senna (SENOKOT) 8.6 MG TABS tablet Take 1 tablet (8.6 mg total) by mouth at bedtime. 120 each 0  . sodium chloride (OCEAN) 0.65 % SOLN nasal spray Place 1 spray into both nostrils as needed for congestion. 15 mL 0  . ST JOHNS WORT PO Take 1 capsule by mouth 2 (two) times daily.    Marland Kitchen tiZANidine (ZANAFLEX) 4 MG tablet Take 0.5-1 tablets (2-4 mg total) by mouth every 8 (eight) hours as needed for muscle spasms. 30 tablet 0   No current facility-administered medications for this visit.    Allergies:   Keflex; Nulecit; Biaxin; Spiriva handihaler; Tramadol; Adhesive; Clarithromycin; Darvon; Epinephrine; Fentanyl; Morphine and related; Oxycodone; Sulfa antibiotics; and Vicodin   Social History:  The patient  reports that she quit smoking about 23 years ago. Her smoking use included Cigarettes. She has a 135 pack-year smoking history. She has never used smokeless tobacco. She reports that she does not drink alcohol or use illicit drugs.   Family History:  The patient's family history includes Breast cancer in her paternal aunt, paternal aunt, and paternal aunt; Cancer in her maternal aunt and maternal grandmother. There is no history of Anesthesia problems, Hypotension, Malignant hyperthermia, Pseudochol deficiency, or Heart attack.  ROS:  Please see the history of present illness.    All other systems are reviewed and otherwise negative.  PHYSICAL EXAM:  VS:  BP 120/50 mmHg  Pulse 76  Ht 5\' 3"  (1.6 m)  Wt  126 lb (57.153 kg)  BMI 22.33 kg/m2 BMI: Body mass index is 22.33 kg/(m^2). Thin elderly WF in no acute distress HEENT: normocephalic, atraumatic Neck: no JVD, carotid bruits or masses Cardiac:  normal S1, S2; RRR; 2/6 SEM RUSB Lungs: coarse but generally clear bilaterally, no wheezing, rhonchi or rales, mildly decreased air movement Abd: soft, nontender, no hepatomegaly, + BS MS: no deformity or atrophy Ext: no edema Skin: warm and dry, no rash Neuro:  moves all extremities spontaneously, no focal abnormalities noted, follows commands Psych: euthymic mood, full affect   EKG:  Done today shows NSR 76bpm, septal infarct age undetermined, nonspecific changes, TWI inferiorly and V6. No sig change from prior.  Recent Labs: 04/29/2015: B Natriuretic Peptide 707.5* 05/08/2015: Pro B Natriuretic peptide (BNP) 469.0* 05/27/2015: BUN 38*; Creatinine, Ser 1.37*; Hemoglobin 9.9*; Platelets 475.0*; Potassium 4.5; Sodium 125*  No results found for requested labs within last 365 days.   CrCl cannot be calculated (Patient has no serum creatinine result on file.).   Wt Readings from Last 3 Encounters:  10/27/15 126 lb (57.153 kg)  09/23/15 125 lb (56.7 kg)  09/22/15 125 lb 3.2 oz (56.79 kg)     Other studies reviewed: Additional studies/records reviewed today include: summarized above  ASSESSMENT AND PLAN:  1. CAD - multivessel, no good options for revascularization. She is being treated medically. Angina has improved with 90mg  of total isosorbide daily but she continues to have significant angina. Will increase Imdur to 60mg  BID. She reports her BP tends to run on the lower side at times - given this as well as her h/o AS, will decrease her losartan to 50mg  to accomodate the increased Imdur dose. The patient does not want to come in for close f/u, citing that it's too hard for her to get to our office. I told her if she has no improvement in symptoms with increased Imdur, to please call us - at  which time would consider adding amlodipine or Ranexa. She agreed to this plan. Continue ASA, Plavix and BB. 2. Chronic combined CHF - appears euvolemic. Continue present regimen. 3. Aortic stenosis - being managed medically. 4. Essential HTN - see above re: med changes. 5. HLD - will plan to check CMET/lipids at next OV.  Disposition: F/u with Dr. Radford Pax in 4 months. The patient wanted to go out to 6 months because she has a hard time getting to our office but agreed to return in 4 months' time. She follows closely with PCP as well for her chronic medical conditions including anemia. Per patient report she has a referral to pulm pending.  Current medicines are reviewed at length with the patient today.  The patient did not have any concerns regarding medicines.  Raechel Ache PA-C 10/27/2015 2:34 PM     [facility] [place] [address] [phone] (office)  [phone] (fax)

## 2015-10-27 NOTE — Patient Instructions (Signed)
Medication Instructions:  Your physician has recommended you make the following change in your medication:  1.  INCREASE the Imdur to 60 mg tablet take 1 tablet twice a day 2.  DECREASE the Losartan to 50 mg taking 1 tablet daily   Labwork: 4 months at same time as appt:  CMET & LIPID  Testing/Procedures: None ordered  Follow-Up: Your physician recommends that you schedule a follow-up appointment in: 4 MONTHS WITH DR. Radford Pax   Any Other Special Instructions Will Be Listed Below (If Applicable).  If you need a refill on your cardiac medications before your next appointment, please call your pharmacy.

## 2015-11-11 ENCOUNTER — Telehealth: Payer: Self-pay | Admitting: Cardiology

## 2015-11-11 MED ORDER — ISOSORBIDE MONONITRATE ER 60 MG PO TB24: 60.0000 mg | ORAL_TABLET | Freq: Two times a day (BID) | ORAL | Status: AC

## 2015-11-11 NOTE — Telephone Encounter (Signed)
Rx sent 

## 2015-11-11 NOTE — Telephone Encounter (Signed)
New message      Calling to get new presc called in for isosorbide 60mg .  Pt is taking 60mg  bid. They need a presc with the new dosage.

## 2015-11-17 ENCOUNTER — Other Ambulatory Visit (INDEPENDENT_AMBULATORY_CARE_PROVIDER_SITE_OTHER): Payer: Medicare Other

## 2015-11-17 ENCOUNTER — Ambulatory Visit (INDEPENDENT_AMBULATORY_CARE_PROVIDER_SITE_OTHER): Payer: Medicare Other | Admitting: Internal Medicine

## 2015-11-17 ENCOUNTER — Encounter: Payer: Self-pay | Admitting: Internal Medicine

## 2015-11-17 VITALS — BP 132/74 | HR 83 | Ht 60.25 in | Wt 125.0 lb

## 2015-11-17 DIAGNOSIS — J9611 Chronic respiratory failure with hypoxia: Secondary | ICD-10-CM | POA: Insufficient documentation

## 2015-11-17 DIAGNOSIS — N184 Chronic kidney disease, stage 4 (severe): Secondary | ICD-10-CM

## 2015-11-17 DIAGNOSIS — R0600 Dyspnea, unspecified: Secondary | ICD-10-CM

## 2015-11-17 DIAGNOSIS — J449 Chronic obstructive pulmonary disease, unspecified: Secondary | ICD-10-CM

## 2015-11-17 LAB — CBC WITH DIFFERENTIAL/PLATELET
BASOS PCT: 0.3 % (ref 0.0–3.0)
Basophils Absolute: 0 10*3/uL (ref 0.0–0.1)
EOS PCT: 3.1 % (ref 0.0–5.0)
Eosinophils Absolute: 0.3 10*3/uL (ref 0.0–0.7)
HCT: 28.9 % — ABNORMAL LOW (ref 36.0–46.0)
HEMOGLOBIN: 9.1 g/dL — AB (ref 12.0–15.0)
LYMPHS ABS: 1.1 10*3/uL (ref 0.7–4.0)
Lymphocytes Relative: 10.6 % — ABNORMAL LOW (ref 12.0–46.0)
MCHC: 31.6 g/dL (ref 30.0–36.0)
MCV: 87.2 fl (ref 78.0–100.0)
MONO ABS: 0.8 10*3/uL (ref 0.1–1.0)
Monocytes Relative: 7.5 % (ref 3.0–12.0)
NEUTROS ABS: 8.2 10*3/uL — AB (ref 1.4–7.7)
Neutrophils Relative %: 78.5 % — ABNORMAL HIGH (ref 43.0–77.0)
PLATELETS: 437 10*3/uL — AB (ref 150.0–400.0)
RBC: 3.32 Mil/uL — ABNORMAL LOW (ref 3.87–5.11)
RDW: 19.1 % — AB (ref 11.5–15.5)
WBC: 10.5 10*3/uL (ref 4.0–10.5)

## 2015-11-17 LAB — BRAIN NATRIURETIC PEPTIDE: Pro B Natriuretic peptide (BNP): 685 pg/mL — ABNORMAL HIGH (ref 0.0–100.0)

## 2015-11-17 LAB — BASIC METABOLIC PANEL
BUN: 23 mg/dL (ref 6–23)
CALCIUM: 9.7 mg/dL (ref 8.4–10.5)
CO2: 18 mEq/L — ABNORMAL LOW (ref 19–32)
Chloride: 112 mEq/L (ref 96–112)
Creatinine, Ser: 1.77 mg/dL — ABNORMAL HIGH (ref 0.40–1.20)
GFR: 29.38 mL/min — AB (ref >60.00)
GLUCOSE: 116 mg/dL — AB (ref 70–99)
POTASSIUM: 5.7 meq/L — AB (ref 3.5–5.1)
SODIUM: 137 meq/L (ref 135–145)

## 2015-11-17 LAB — TSH: TSH: 1.03 u[IU]/mL (ref 0.35–4.50)

## 2015-11-17 NOTE — Patient Instructions (Signed)
Continue breo each am  Continue to use neb if needed up to every 4- 6 hours  Please schedule a follow up office visit in 6 weeks, call sooner if needed with pfts

## 2015-11-17 NOTE — Progress Notes (Signed)
Subjective:    Patient ID: [name], female    DOB: 18-Jul-1934,   MRN: [id number]  HPI   89 yowf widow of GP  quit smoking around [place] 11/05/11  with chronic doe  s/p treatment for es ca = RT / surg/ chemo  2012 and worse breathing sinc 2014 on 02 hs since then   But started needing it 24/7 since May 2016 p admit [facility]    Admit date: 05/14/2015 Discharge date: 05/20/2015  Primary Care Provider: Martinique, [name] Primary Cardiologist: Dr. Radford Pax  Discharge Diagnoses Principal Problem:  CAD (coronary artery disease), native coronary artery Active Problems:  Pernicious anemia  Adenocarcinoma of gastroesophageal junction  HX of multiple bleeding ulcers  Hypertension  CAP (community acquired pneumonia)  Diastolic CHF, acute on chronic  COPD with acute exacerbation  Shoulder fracture, left     R[facility]   05/14/15   PA mean 36 with pcwp = 25     PVR = 2.62  [facility] units  With MS on echo  11/17/2015 1st [facility] office visit/ Kjerstin Abrigo   Chief Complaint  Patient presents with  . Pulmonary Consult    Referred by Dr. Betty Martinique. Pt c/o SOB for the past 4 yrs. She states that she is here to have POC ordered. She gets SOB "walking not a long way".  She uses albuterol neb 1-2 x per day.   maint on Breo which she says helps a lot when she remembers to take it when also relied on an albuterol nebulizer up to twice daily. She actually sleeps flat at night on oxygen without significant respiratory disturbance or  am exacerbation of cough wheeze or shortness of breath.  No obvious other patterns in day to day or daytime variabilty or assoc excess/ purulent sputum or mucus plugs   or cp or chest tightness, subjective wheeze overt sinus or hb symptoms. No unusual exp hx or h/o childhood pna/ asthma or knowledge of premature birth.  Sleeping ok without nocturnal  or early am exacerbation  of respiratory  c/o's or need for noct saba. Also denies any obvious  fluctuation of symptoms with weather or environmental changes or other aggravating or alleviating factors except as outlined above   Current Medications, Allergies, Complete Past Medical History, Past Surgical History, Family History, and Social History were reviewed in [employer] record.           Review of Systems  Constitutional: Positive for unexpected weight change. Negative for fever and chills.  HENT: Positive for dental problem. Negative for congestion, ear pain, nosebleeds, postnasal drip, rhinorrhea, sinus pressure, sneezing, sore throat, trouble swallowing and voice change.   Eyes: Negative for visual disturbance.  Respiratory: Positive for shortness of breath. Negative for cough and choking.   Cardiovascular: Negative for chest pain and leg swelling.  Gastrointestinal: Negative for vomiting, abdominal pain and diarrhea.  Genitourinary: Negative for difficulty urinating.  Musculoskeletal: Positive for arthralgias.  Skin: Negative for rash.  Neurological: Negative for tremors, syncope and headaches.  Hematological: Does not bruise/bleed easily.       Objective:   Physical Exam  Chronically ill elderly wf w/c bound  Wt Readings from Last 3 Encounters:  11/17/15 125 lb (56.7 kg)  10/27/15 126 lb (57.153 kg)  09/23/15 125 lb (56.7 kg)    Vital signs reviewed   HEENT: nl dentition, turbinates, and oropharynx. Nl external ear canals without cough reflex   NECK :  without JVD/Nodes/TM/ nl carotid upstrokes bilaterally   LUNGS: no acc muscle use,  Nl contour chest which is clear to A and P bilaterally without cough on insp or exp maneuvers   CV:  RRR  no s3  - pos III/VI SEM with increase in P2, no edema   ABD:  soft and nontender with nl inspiratory excursion in the supine position. No bruits or organomegaly, bowel sounds nl  MS:  Nl gait/ ext warm without deformities, calf tenderness, cyanosis or clubbing No obvious joint restrictions    SKIN: warm and dry without lesions    NEURO:  alert, approp, nl sensorium with  no motor deficits     Labs ordered/ reviewed:    Lab 11/17/15 1516  NA 137  K 5.7*  CL 112  CO2 18*  BUN 23  CREATININE 1.77*  GLUCOSE 116*    Recent Labs Lab 11/17/15 1516  HGB 9.1*  HCT 28.9*  WBC 10.5  PLT 437.0*     Lab Results  Component Value Date   TSH 1.03 11/17/2015     Lab Results  Component Value Date   PROBNP 685.0* 11/17/2015                Assessment & Plan:

## 2015-11-18 ENCOUNTER — Encounter: Payer: Self-pay | Admitting: Internal Medicine

## 2015-11-18 NOTE — Assessment & Plan Note (Addendum)
Adequate control on present rx, reviewed > no change in rx needed  = 2lpm at hs/ prn daytime

## 2015-11-18 NOTE — Assessment & Plan Note (Addendum)
[place]   05/14/15   PA mean 36 with pcwp = 25     PVR = 2.62  [facility] units  With MS on echo  11/17/2015   Walked RA x one lap @ 185 stopped due to  Sob not no desats > no indication for ambulatory 02   This is multifactorial but clearly related to deconditioning, valvular heart disease with MITRAL stenosis with secondary (not primary ) pulmonary hypertension and underlying COPD with anemia also a concern. However, additional 02 unlikely to help here

## 2015-11-18 NOTE — Assessment & Plan Note (Addendum)
PFTs 11/05/11   FEV1  1.08 (51%) ratio 58   Adequate control on present rx, reviewed > no change in rx needed  = breo each am and prn saba  Could add laba on trial basis but really doubt based on all of the problems she is having that this is going to add any benefit  I had an extended discussion with the patient reviewing all relevant studies completed to date and  Lasting 35  minutes of a 60 minute extended visit    Each maintenance medication was reviewed in detail including most importantly the difference between maintenance and prns and under what circumstances the prns are to be triggered using an action plan format that is not reflected in the computer generated alphabetically organized AVS.    Please see instructions for details which were reviewed in writing and the patient given a copy highlighting the part that I personally wrote and discussed at today's ov.

## 2015-11-18 NOTE — Progress Notes (Signed)
Quick Note:  Spoke with pt and notified of results per Dr. Wert. Pt verbalized understanding and denied any questions.  ______ 

## 2015-12-31 ENCOUNTER — Ambulatory Visit (INDEPENDENT_AMBULATORY_CARE_PROVIDER_SITE_OTHER): Payer: Medicare Other | Admitting: Internal Medicine

## 2015-12-31 ENCOUNTER — Encounter: Payer: Self-pay | Admitting: Internal Medicine

## 2015-12-31 VITALS — BP 106/70 | HR 78 | Ht 61.0 in | Wt 127.0 lb

## 2015-12-31 DIAGNOSIS — R0600 Dyspnea, unspecified: Secondary | ICD-10-CM

## 2015-12-31 DIAGNOSIS — J449 Chronic obstructive pulmonary disease, unspecified: Secondary | ICD-10-CM | POA: Diagnosis not present

## 2015-12-31 DIAGNOSIS — J9611 Chronic respiratory failure with hypoxia: Secondary | ICD-10-CM

## 2015-12-31 DIAGNOSIS — N184 Chronic kidney disease, stage 4 (severe): Secondary | ICD-10-CM | POA: Insufficient documentation

## 2015-12-31 LAB — PULMONARY FUNCTION TEST
DL/VA % pred: 56 %
DL/VA: 2.5 ml/min/mmHg/L
DLCO unc % pred: 41 %
DLCO unc: 8.4 ml/min/mmHg
FEF 25-75 POST: 0.5 L/s
FEF 25-75 PRE: 0.52 L/s
FEF2575-%CHANGE-POST: -4 %
FEF2575-%PRED-POST: 39 %
FEF2575-%PRED-PRE: 41 %
FEV1-%Change-Post: -2 %
FEV1-%PRED-POST: 70 %
FEV1-%Pred-Pre: 71 %
FEV1-POST: 1.18 L
FEV1-PRE: 1.2 L
FEV1FVC-%CHANGE-POST: -2 %
FEV1FVC-%Pred-Pre: 80 %
FEV6-%CHANGE-POST: 0 %
FEV6-%Pred-Post: 90 %
FEV6-%Pred-Pre: 91 %
FEV6-PRE: 1.95 L
FEV6-Post: 1.94 L
FEV6FVC-%Change-Post: 0 %
FEV6FVC-%Pred-Post: 102 %
FEV6FVC-%Pred-Pre: 103 %
FVC-%CHANGE-POST: 0 %
FVC-%Pred-Post: 89 %
FVC-%Pred-Pre: 88 %
FVC-Post: 2.02 L
FVC-Pre: 2.01 L
POST FEV1/FVC RATIO: 58 %
PRE FEV1/FVC RATIO: 60 %
Post FEV6/FVC ratio: 96 %
Pre FEV6/FVC Ratio: 97 %

## 2015-12-31 NOTE — Assessment & Plan Note (Signed)
-    12/31/2015   Walked RA x one lap @ 185 stopped due to  Legs gave out, sob but no desat   As of 12/31/2015 02 is prn

## 2015-12-31 NOTE — Assessment & Plan Note (Signed)
-   PFTs 11/05/11   FEV1  1.08 (51%) ratio 58  - PFT's  12/31/2015  FEV1 1.18 (70 % ) ratio 58  p no % improvement from saba with DLCO  41 % corrects to 56 % for alv volume  p am BREO 200  Patient failed to answer a single question asked in a straightforward manner, tending to go off on tangents or answer questions with ambiguous medical terms or diagnoses and seemed aggravated  when asked the same question more than once for clarification.   She only has mild/mod copd and not clear she's benefiting from breo > might suggest trial of ANORO if breathing worse off breo or increase need for saba.  I had an extended discussion with the patient reviewing all relevant studies completed to date and  lasting 15 to 20 minutes of a 25 minute visit    Each maintenance medication was reviewed in detail including most importantly the difference between maintenance and prns and under what circumstances the prns are to be triggered using an action plan format that is not reflected in the computer generated alphabetically organized AVS.    Please see instructions for details which were reviewed in writing and the patient given a copy highlighting the part that I personally wrote and discussed at today's ov.

## 2015-12-31 NOTE — Assessment & Plan Note (Signed)
Pt advised to stop KCL at this point and instructed to f/u with primary care ? May need oracit to improve buffering capacity to help reduce needed resp compensation > increase wob

## 2015-12-31 NOTE — Progress Notes (Signed)
Subjective:    Patient ID: [name], female    DOB: 1934-05-14   MRN: [id number]     Brief patient profile:  6 yowf widow of GP  quit smoking around 1996  With GOLD II criteria copd 11/05/11  with chronic doe  s/p treatment for es ca = RT / surg/ chemo  2012 and worse breathing sinc 2014 on 02 hs since then   But started needing it 24/7 since May 2016 p admit [facility]    Admit date: 05/14/2015 Discharge date: 05/20/2015  Primary Care Provider: Martinique, [name] Primary Cardiologist: Dr. Radford Pax  Discharge Diagnoses Principal Problem:  CAD (coronary artery disease), native coronary artery Active Problems:  Pernicious anemia  Adenocarcinoma of gastroesophageal junction  HX of multiple bleeding ulcers  Hypertension  CAP (community acquired pneumonia)  Diastolic CHF, acute on chronic  COPD with acute exacerbation  Shoulder fracture, left     RHC   05/14/15   PA mean 36 with pcwp = 25     PVR = 2.62  [facility] units  With MS on echo   History of Present Illness  11/17/2015 1st [facility] office visit/ Toren Tucholski   Chief Complaint  Patient presents with  . Pulmonary Consult    Referred by Dr. Betty Martinique. Pt c/o SOB for the past 4 yrs. She states that she is here to have POC ordered. She gets SOB "walking not a long way".  She uses albuterol neb 1-2 x per day.   maint on Breo which she says helps a lot when she remembers to take it when also relied on an albuterol nebulizer up to twice daily. She actually sleeps flat at night on oxygen without significant respiratory disturbance or  am exacerbation of cough wheeze or shortness of breath. rec Continue breo each am Continue to use neb if needed up to every 4- 6 hours   12/31/2015  f/u ov/Geoffery Aultman re: GOLD II copd  Chief Complaint  Patient presents with  . Follow-up    Pt states that her breathing is unchanged. She c/o increased cough for the past wk- prod with clear sputum. She uses neb once daily on average- unsure if plain  albuterol or duoneb.   only uses 02 prn 2lpm and that is rare Sob room to room     No obvious day to day or daytime variability or assoc  cp or chest tightness, subjective wheeze or overt sinus or hb symptoms. No unusual exp hx or h/o childhood pna/ asthma or knowledge of premature birth.  Sleeping ok without nocturnal  or early am exacerbation  of respiratory  c/o's or need for noct saba. Also denies any obvious fluctuation of symptoms with weather or environmental changes or other aggravating or alleviating factors except as outlined above   Current Medications, Allergies, Complete Past Medical History, Past Surgical History, Family History, and Social History were reviewed in [employer] record.  ROS  The following are not active complaints unless bolded sore throat, dysphagia, dental problems, itching, sneezing,  nasal congestion or excess/ purulent secretions, ear ache,   fever, chills, sweats, unintended wt loss, classically pleuritic or exertional cp, hemoptysis,  orthopnea pnd or leg swelling, presyncope, palpitations, abdominal pain, anorexia, nausea, vomiting, diarrhea  or change in bowel or bladder habits, change in stools or urine, dysuria,hematuria,  rash, arthralgias, visual complaints, headache, numbness, weakness or ataxia or problems with walking or coordination,  change in mood/affect or memory.  Objective:   Physical Exam  Chronically ill elderly wf     12/31/2015         11/17/15 125 lb (56.7 kg)  10/27/15 126 lb (57.153 kg)  09/23/15 125 lb (56.7 kg)    Vital signs reviewed   HEENT: nl dentition, turbinates, and oropharynx. Nl external ear canals without cough reflex   NECK :  without JVD/Nodes/TM/ nl carotid upstrokes bilaterally   LUNGS: no acc muscle use,  Nl contour chest which is clear to A and P bilaterally without cough on insp or exp maneuvers   CV:  RRR  no s3  - pos III/VI SEM with increase in P2, no edema    ABD:  soft and nontender with nl inspiratory excursion in the supine position. No bruits or organomegaly, bowel sounds nl  MS:  Nl gait/ ext warm without deformities, calf tenderness, cyanosis or clubbing No obvious joint restrictions   SKIN: warm and dry without lesions    NEURO:  alert, approp, nl sensorium with  no motor deficits     Labs  reviewed:    Lab 11/17/15 1516  NA 137  K 5.7*  CL 112  CO2 18*  BUN 23  CREATININE 1.77*  GLUCOSE 116*    Recent Labs Lab 11/17/15 1516  HGB 9.1*  HCT 28.9*  WBC 10.5  PLT 437.0*     Lab Results  Component Value Date   TSH 1.03 11/17/2015     Lab Results  Component Value Date   PROBNP 685.0* 11/17/2015        CXR PA and Lateral:   12/31/2015 :    I personally reviewed images and agree with radiology impression as follows:           Assessment & Plan:

## 2015-12-31 NOTE — Progress Notes (Signed)
PFT done today. 

## 2015-12-31 NOTE — Patient Instructions (Signed)
You only have mild to moderate copd and it would preclude surgery if needed   Ok to try off BREO to see what difference it makes - if you noticing breathing or coughing getting worse or more need for nebulizer, need to start    If you are satisfied with your treatment plan,  let your doctor know and he/she can either refill your medications or you can return here when your prescription runs out.     If in any way you are not 100% satisfied,  please tell us.  If 100% better, tell your friends!  Pulmonary follow up is as needed

## 2015-12-31 NOTE — Assessment & Plan Note (Addendum)
[place]   05/14/15   PA mean 36 with pcwp = 25     PVR = 2.62  [facility] units  With MS on echo -  11/17/2015   Walked RA x one lap @ 185 stopped due to  Sob not no desats > no indication for ambulatory 02  -  12/31/2015   Walked RA x one lap @ 185 stopped due to  Legs gave out, sob but no desat   Clearly sob is not copd related and  not primary PH but rather due to valvular ht dz/ anemia/ cri with met acidosis   Defer co-ordination of care to Dr Inez Catalina Jordan/ Alfonzo Feller - we'll see her back here prn

## 2016-01-21 ENCOUNTER — Other Ambulatory Visit: Payer: Self-pay | Admitting: Physician Assistant

## 2016-02-17 NOTE — Progress Notes (Signed)
Cardiology Office Note   Date:  02/18/2016   ID:  [name], DOB [birth year], MRN [id number]  PCP:  Martinique, [name], MD    Chief Complaint  Patient presents with  . Coronary Artery Disease  . Aortic Stenosis  . Congestive Heart Failure      History of Present Illness: [name] is a 82 y.o. female with a hx of HTN, diastolic HF, GERD, pernicious anemia, prior GI bleed, esophageal adenocarcinoma, COPD, remote history of tobacco abuse. She also has severe multivessel CAD including significant ost LM and mid RCA disease which would be best approached by bypass surgery, however she was felt to be a poor candidate for CABG given her advanced age, severe COPD, chronic steroid-induced use and poor mobility.  High risk PCI could be considered, however cardiac catheterization showed severe left main disease that is very difficult to approach by PCI given ostial location. Echo demonstrated EF [id number], grade 2 diastolic dysfunction, moderate aortic stenosis with mean gradient 20 mmHg, moderately dilated LA and PASP 46 mmHg. She was seen by Dr. Roxan Hockey of cardiothoracic surgery team and it was felt that her risk associated with surgery is prohibitive. After review by interventional cardiology, med Rx was recommended.   She is here today for followup.   She is on chronic O2. She sleeps on 3 pillows chronically more for comfort than breathing. Her SOB is stable and has not changed recently .  She still gets significant SOB with ambulation.  She saw Dr. Melvyn Novas a few months ago and he told her her SOB was related to her underlying heart issues and took her off O2 during the day. She still uses O2 at night but Dr. Melvyn Novas took her off of it during the day.  She denies PND. She has chronic stable angina that is somewhat controlled on nitrates. She says that the angina is sporadic. She denies any LE edema. She is having some episodes when her heart races up to a HR of  170bpm.      Past Medical History  Diagnosis Date  . Fibromyalgia   . Skin lesion     chronic calcific cutaneous lesions  . HX of multiple bleeding ulcers 09/01/2011  . Hypertension   . Emphysema   . Dizziness     d/t crystals in ears that "roll around"and can't get them out  . Osteoporosis   . Scoliosis   . Psoriasis   . H/O hiatal hernia   . GERD (gastroesophageal reflux disease)   . Gastric ulcer   . Diverticulosis   . History of colonic polyps   . Shingles   . Vitamin B deficiency   . COPD (chronic obstructive pulmonary disease) ([place])   . On home oxygen therapy     "2L;  24/7" (05/14/2015)  . Left humeral fracture 04/26/2015  . Scoliosis   . Complication of anesthesia     shortness of breath and pain with gas   . Pneumonia 2015 X 3  . Chronic bronchitis ([place])     "I about keep it"  . History of blood transfusion     "related to my anemia"  . Osteoarthritis   . Arthritis     "qwhere" (05/14/2015)  . Chronic upper back pain   . Kidney stone 25+yrs ago    "passed them"  . Adenocarcinoma of gastroesophageal junction ([place])   . Pernicious anemia   .  Iron deficiency anemia   . Anemia, B12 deficiency     "get shots once/month" (05/14/2015)  . Chronic combined systolic (congestive) and diastolic (congestive) heart failure ([place])     a. 2D echo 05/12/15: EF 40-45, diffuse HK, grade 2 DD, moderate AS, mild MR, mod LAE, PASP 33mmHg.  Marland Kitchen Coronary artery disease     a. Severe 3 vessel ASCAD felt to be a poor candidate for CABG given her advanced age, severe COPD, chronic steroid-induced use and poor mobility. Not a good candidate for PCI either.  . Aortic stenosis     a. Mod by echo 05/2015.  Marland Kitchen Hyperlipidemia   . Pulmonary hypertension [facility])     Past Surgical History  Procedure Laterality Date  . Total hip arthroplasty Right 2001  . Tonsillectomy      as a child  . Tubal ligation  1975  . Appendectomy  1975  . Cataract extraction w/ intraocular lens  implant, bilateral  Bilateral 10+yrs ago  . Esophagogastroduodenoscopy    . Colonoscopy    . Partial esophagectomy  12/20/2011    Procedure: ESOPHAGECTOMY PARTIAL;  Surgeon: Pierre Bali, MD;  Location: [facility];  Service: Thoracic;  Laterality: N/A;  . Total hip arthroplasty  06/30/2012    Procedure: TOTAL HIP ARTHROPLASTY ANTERIOR APPROACH;  Surgeon: Mcarthur Rossetti, MD;  Location: [facility];  Service: Orthopedics;  Laterality: Left;  Left total hip arthroplasty  . Shoulder closed reduction  04/26/2015    Procedure: CLOSED MANIPULATION SHOULDER;  Surgeon: Meredith Pel, MD;  Location: [facility];  Service: Orthopedics;;  . Cardiac catheterization N/A 05/14/2015    Procedure: Right/Left Heart Cath and Coronary Angiography;  Surgeon: Burnell Blanks, MD;  Location: [facility];  Service: Cardiovascular;  Laterality: N/A;  . Joint replacement    . Dilation and curettage of uterus    . Back surgery    . Fixation kyphoplasty thoracic spine  X 3    "broke my back in 6 places related to fall/coughing; 3 different cases"  . Orif humerus fracture Left 05/15/2015    Procedure: Left OPEN REDUCTION INTERNAL FIXATION (ORIF) PROXIMAL HUMERUS FRACTURE;  Surgeon: Meredith Pel, MD;  Location: [facility];  Service: Orthopedics;  Laterality: Left;     Current Outpatient Prescriptions  Medication Sig Dispense Refill  . acetaminophen (TYLENOL) 500 MG tablet Take 500 mg by mouth daily.    Marland Kitchen albuterol (PROVENTIL HFA;VENTOLIN HFA) 108 (90 BASE) MCG/ACT inhaler Inhale 2 puffs into the lungs every 4 (four) hours as needed for wheezing (wheezing).    Marland Kitchen albuterol (PROVENTIL) (2.5 MG/3ML) 0.083% nebulizer solution Take 3 mLs (2.5 mg total) by nebulization every 2 (two) hours as needed for wheezing or shortness of breath. 75 mL 12  . aspirin EC 81 MG tablet Take 81 mg by mouth every morning.     Marland Kitchen atenolol (TENORMIN) 25 MG tablet Take 25 mg by mouth daily.    Marland Kitchen atorvastatin (LIPITOR) 80 MG tablet Take 1 tablet (80 mg  total) by mouth daily at 6 PM. 30 tablet 11  . BREO ELLIPTA 200-25 MCG/INH AEPB Inhale 1 puff into the lungs daily.    . clopidogrel (PLAVIX) 75 MG tablet Take 1 tablet (75 mg total) by mouth daily. 30 tablet 11  . cyanocobalamin (,VITAMIN B-12,) 1000 MCG/ML injection Inject 1,000 mcg into the muscle every 30 (thirty) days.     . DULoxetine (CYMBALTA) 30 MG capsule Take 30 mg by mouth daily.    Marland Kitchen  ergocalciferol (VITAMIN D2) 50000 UNITS capsule Take 50,000 Units by mouth every 14 (fourteen) days.     Marland Kitchen esomeprazole (NEXIUM) 20 MG capsule Take 20 mg by mouth daily before breakfast.     . furosemide (LASIX) 20 MG tablet Take 20 mg by mouth daily. Take one tablet every evening    . furosemide (LASIX) 40 MG tablet Take 40 mg by mouth daily. Take one tablet every morning  3  . guaiFENesin (MUCINEX) 600 MG 12 hr tablet Take 1 tablet (600 mg total) by mouth 2 (two) times daily.    . hydroxypropyl methylcellulose (ISOPTO TEARS) 2.5 % ophthalmic solution Place 1 drop into both eyes 3 (three) times daily as needed for dry eyes.    Marland Kitchen ibuprofen (ADVIL,MOTRIN) 200 MG tablet Take 400 mg by mouth daily as needed (pain).     Marland Kitchen ipratropium-albuterol (DUONEB) 0.5-2.5 (3) MG/3ML SOLN Take 3 mLs by nebulization every 6 (six) hours. 360 mL   . isosorbide mononitrate (IMDUR) 60 MG 24 hr tablet Take 1 tablet (60 mg total) by mouth 2 (two) times daily. 60 tablet 11  . KLOR-CON M20 20 MEQ tablet TAKE 1 TABLET (20 MEQ TOTAL) BY MOUTH 2 (TWO) TIMES DAILY. 60 tablet 11  . loratadine (CLARITIN) 10 MG tablet Take 10 mg by mouth every morning.     Marland Kitchen LORazepam (ATIVAN) 0.5 MG tablet Take 1 tablet (0.5 mg total) by mouth every 8 (eight) hours as needed for anxiety. 14 tablet 0  . losartan (COZAAR) 50 MG tablet Take 1 tablet (50 mg total) by mouth daily. 90 tablet 3  . nitroGLYCERIN (NITROSTAT) 0.4 MG SL tablet Place 1 tablet (0.4 mg total) under the tongue every 5 (five) minutes as needed for chest pain. 100 tablet 3  . nystatin  (MYCOSTATIN) 100000 UNIT/ML suspension TAKE 4-5 ML FOUR TIMES A DAY MOUTH/THROAT 20 DAYS  0  . OXYGEN Inhale 2 L into the lungs. As needed per pt    . potassium chloride (KLOR-CON) 20 MEQ packet Take 20 mEq by mouth daily.    Marland Kitchen senna (SENOKOT) 8.6 MG TABS tablet Take 1 tablet (8.6 mg total) by mouth at bedtime. 120 each 0  . sodium chloride (OCEAN) 0.65 % SOLN nasal spray Place 1 spray into both nostrils as needed for congestion. 15 mL 0  . ST JOHNS WORT PO Take 1 capsule by mouth 2 (two) times daily.    Marland Kitchen tiZANidine (ZANAFLEX) 4 MG tablet Take 0.5-1 tablets (2-4 mg total) by mouth every 8 (eight) hours as needed for muscle spasms. 30 tablet 0  . zoledronic acid (RECLAST) 5 MG/100ML SOLN injection Inject 5 mg into the vein once.     No current facility-administered medications for this visit.    Allergies:   Keflex; Nulecit; Biaxin; Spiriva handihaler; Tramadol; Adhesive; Clarithromycin; Darvon; Epinephrine; Fentanyl; Morphine and related; Oxycodone; Sulfa antibiotics; and Vicodin    Social History:  The patient  reports that she quit smoking about 24 years ago. Her smoking use included Cigarettes. She has a 135 pack-year smoking history. She has never used smokeless tobacco. She reports that she does not drink alcohol or use illicit drugs.   Family History:  The patient's family history includes Breast cancer in her paternal aunt, paternal aunt, and paternal aunt; Cancer in her maternal aunt and maternal grandmother. There is no history of Anesthesia problems, Hypotension, Malignant hyperthermia, Pseudochol deficiency, or Heart attack.    ROS:  Please see the history of present illness.  Otherwise, review of systems are positive for none.   All other systems are reviewed and negative.    PHYSICAL EXAM: VS:  There were no vitals taken for this visit. , BMI There is no weight on file to calculate BMI. GEN: Well nourished, well developed, in no acute distress HEENT: normal Neck: no JVD,  carotid bruits, or masses Cardiac: RRR; no murmurs, rubs, or gallops,no edema  Respiratory:  clear to auscultation bilaterally, normal work of breathing GI: soft, nontender, nondistended, + BS MS: no deformity or atrophy Skin: warm and dry, no rash Neuro:  Strength and sensation are intact Psych: euthymic mood, full affect   EKG:  EKG is not ordered today.    Recent Labs: 04/29/2015: B Natriuretic Peptide 707.5* 11/17/2015: BUN 23; Creatinine, Ser 1.77*; Hemoglobin 9.1*; Platelets 437.0*; Potassium 5.7*; Pro B Natriuretic peptide (BNP) 685.0*; Sodium 137; TSH 1.03    Lipid Panel No results found for: CHOL, TRIG, HDL, CHOLHDL, VLDL, LDLCALC, LDLDIRECT    Wt Readings from Last 3 Encounters:  12/31/15 127 lb (57.607 kg)  11/17/15 125 lb (56.7 kg)  10/27/15 126 lb (57.153 kg)    ASSESSMENT AND PLAN: 1.  CAD - multivessel, no good options for revascularization. She is being treated medically. Angina has improved with higher doses of long acting nitrates although she still has it occasionally.  Continue ASA, Plavix, statin and BB. 2.  Chronic combined CHF - appears euvolemic on exam but she is having a lot of DOE.  She recently saw Dr. Melvyn Novas who felt her DOE was more due to CHF than COPD.  He took her off her O2 during the day.  O2 sats with ambulation in hall today were 99%.  I will check a BNP. Continue present regimen of BB, ARB and diuretic.  If BNP elevated and does not improve with diuresis then will refer back to pulmonary.   3.  Aortic stenosis - being managed medically due to comorbidities. 4.  Essential HTN - controlled on BB and ARB.  5.  HLD - continue statin and check FLP and ALT.  Her LDL goal is < 70.   Current medicines are reviewed at length with the patient today.  The patient does not have concerns regarding medicines.  The following changes have been made:  no change  Labs/ tests ordered today: See above Assessment and Plan No orders of the defined types were  placed in this encounter.     Disposition:   FU with me in 6 months  Signed, Sueanne Margarita, MD  02/18/2016 11:02 AM    [facility] [address], [place], [place]  [address] Phone: [phone]; Fax: [phone]

## 2016-02-18 ENCOUNTER — Ambulatory Visit (INDEPENDENT_AMBULATORY_CARE_PROVIDER_SITE_OTHER): Payer: Medicare Other | Admitting: Cardiology

## 2016-02-18 ENCOUNTER — Other Ambulatory Visit (INDEPENDENT_AMBULATORY_CARE_PROVIDER_SITE_OTHER): Payer: Medicare Other | Admitting: *Deleted

## 2016-02-18 ENCOUNTER — Encounter: Payer: Self-pay | Admitting: Cardiology

## 2016-02-18 VITALS — BP 90/52 | HR 86 | Ht 60.0 in | Wt 128.8 lb

## 2016-02-18 DIAGNOSIS — I5042 Chronic combined systolic (congestive) and diastolic (congestive) heart failure: Secondary | ICD-10-CM | POA: Diagnosis not present

## 2016-02-18 DIAGNOSIS — I2510 Atherosclerotic heart disease of native coronary artery without angina pectoris: Secondary | ICD-10-CM

## 2016-02-18 DIAGNOSIS — I10 Essential (primary) hypertension: Secondary | ICD-10-CM | POA: Diagnosis not present

## 2016-02-18 DIAGNOSIS — E785 Hyperlipidemia, unspecified: Secondary | ICD-10-CM

## 2016-02-18 DIAGNOSIS — I2584 Coronary atherosclerosis due to calcified coronary lesion: Secondary | ICD-10-CM

## 2016-02-18 DIAGNOSIS — I350 Nonrheumatic aortic (valve) stenosis: Secondary | ICD-10-CM

## 2016-02-18 DIAGNOSIS — R0602 Shortness of breath: Secondary | ICD-10-CM

## 2016-02-18 LAB — COMPREHENSIVE METABOLIC PANEL
ALT: 6 U/L (ref 6–29)
AST: 15 U/L (ref 10–35)
Albumin: 3.6 g/dL (ref 3.6–5.1)
Alkaline Phosphatase: 50 U/L (ref 33–130)
BUN: 36 mg/dL — AB (ref 7–25)
CHLORIDE: 105 mmol/L (ref 98–110)
CO2: 19 mmol/L — AB (ref 20–31)
CREATININE: 1.79 mg/dL — AB (ref 0.60–0.93)
Calcium: 9.5 mg/dL (ref 8.6–10.4)
Glucose, Bld: 98 mg/dL (ref 65–99)
POTASSIUM: 5.3 mmol/L (ref 3.5–5.3)
SODIUM: 140 mmol/L (ref 135–146)
Total Bilirubin: 0.3 mg/dL (ref 0.2–1.2)
Total Protein: 6.7 g/dL (ref 6.1–8.1)

## 2016-02-18 LAB — LIPID PANEL
CHOL/HDL RATIO: 1.9 ratio (ref <=5.0)
Cholesterol: 99 mg/dL — ABNORMAL LOW (ref 125–200)
HDL: 52 mg/dL (ref >=46)
LDL CALC: 30 mg/dL (ref <130)
Triglycerides: 83 mg/dL (ref <150)
VLDL: 17 mg/dL (ref <30)

## 2016-02-18 NOTE — Patient Instructions (Addendum)
Medication Instructions:  Your physician recommends that you continue on your current medications as directed. Please refer to the Current Medication list given to you today.    Labwork: TODAY: BMET, BNP  Testing/Procedures: Your physician has requested that you have an echocardiogram. Echocardiography is a painless test that uses sound waves to create images of your heart. It provides your doctor with information about the size and shape of your heart and how well your heart's chambers and valves are working. This procedure takes approximately one hour. There are no restrictions for this procedure.  Follow-Up: Your physician wants you to follow-up in: 6 months with Dr. Radford Pax. You will receive a reminder letter in the mail two months in advance. If you don't receive a letter, please call our office to schedule the follow-up appointment.   Any Other Special Instructions Will Be Listed Below (If Applicable).     If you need a refill on your cardiac medications before your next appointment, please call your pharmacy.

## 2016-02-19 LAB — BRAIN NATRIURETIC PEPTIDE: BRAIN NATRIURETIC PEPTIDE: 596.5 pg/mL — AB (ref <100)

## 2016-02-20 ENCOUNTER — Telehealth: Payer: Self-pay

## 2016-02-20 DIAGNOSIS — R0600 Dyspnea, unspecified: Secondary | ICD-10-CM

## 2016-02-20 DIAGNOSIS — I5042 Chronic combined systolic (congestive) and diastolic (congestive) heart failure: Secondary | ICD-10-CM

## 2016-02-20 NOTE — Telephone Encounter (Signed)
Notes Recorded by Sueanne Margarita, MD on 02/20/2016 at 10:22 AM Please find out if patient was still taking potassium - apparently Dr. Melvyn Novas told her to stop. Hopefully her creatinin which was elevated will improve with diuresis. Please check BMET on monday Notes Recorded by Sueanne Margarita, MD on 02/20/2016 at 10:20 AM BNP increased - increase Lasix to 40mg  BID for 3 days then 40mg  daily. Repeat BMET and BNP in 1 week    Informed patient of results and verbal understanding expressed.  Per Dr. Radford Pax, instructed patient to [facility] (confirmed she has been taking 20 meq daily).  Instructed patient to INCREASE LASIX to 40 mg BID for 3 days and then take 40 mg daily. BMET and BNP scheduled for Wednesday. Patient will call if she needs to reschedule.

## 2016-02-24 ENCOUNTER — Telehealth: Payer: Self-pay | Admitting: Cardiology

## 2016-02-24 NOTE — Telephone Encounter (Signed)
New message      Calling to give the nurse an update

## 2016-02-24 NOTE — Telephone Encounter (Signed)
Patient reports she took Lasix 40 mg for a day and then couldn't handle it anymore (she reports she got "crampy"). She says she went to her PCP yesterday and feels much better.  She is currently taking lasix 20 mg in the AM and 10 mg in the PM and will not take anymore. Patient also refuses to get lab work done tomorrow. She says she went to PCP because of a head cold and will not leave the house. Rescheduled lab work to be done at time of ECHO. Patient agrees with treatment plan. Med list updated.

## 2016-02-25 ENCOUNTER — Other Ambulatory Visit: Payer: Medicare Other

## 2016-03-02 ENCOUNTER — Telehealth: Payer: Self-pay | Admitting: Cardiology

## 2016-03-02 ENCOUNTER — Other Ambulatory Visit: Payer: Self-pay

## 2016-03-02 NOTE — Telephone Encounter (Signed)
The pt wants Dr Radford Pax to know: 1. Her PCP advised her to stop taking Potassium because her potassium level was normal per lab results. 2. Her PCP also ordered a CXR and that it showed that she has minimal fluid in her lungs.  Also, the pt canceled her Echo, BMET and BNP that was scheduled to be done on 3/30 because she states that she has a bad cold and that she has a couple of infected teeth that are bothering her. She states that she will call back to reschedule.

## 2016-03-02 NOTE — Telephone Encounter (Signed)
NEw Message  Pt requested to speak w/ RN-would not specify nature of call. Please call back and discuss.

## 2016-03-04 ENCOUNTER — Other Ambulatory Visit: Payer: Medicare Other

## 2016-03-04 ENCOUNTER — Other Ambulatory Visit (HOSPITAL_COMMUNITY): Payer: Medicare Other

## 2016-03-07 ENCOUNTER — Inpatient Hospital Stay (HOSPITAL_COMMUNITY): Payer: Medicare Other

## 2016-03-07 ENCOUNTER — Inpatient Hospital Stay (HOSPITAL_COMMUNITY)
Admission: EM | Admit: 2016-03-07 | Discharge: 2016-04-05 | DRG: 208 | Disposition: E | Payer: Medicare Other | Attending: Internal Medicine | Admitting: Internal Medicine

## 2016-03-07 ENCOUNTER — Emergency Department (HOSPITAL_COMMUNITY): Payer: Medicare Other

## 2016-03-07 DIAGNOSIS — Q019 Encephalocele, unspecified: Secondary | ICD-10-CM | POA: Diagnosis not present

## 2016-03-07 DIAGNOSIS — Z4659 Encounter for fitting and adjustment of other gastrointestinal appliance and device: Secondary | ICD-10-CM

## 2016-03-07 DIAGNOSIS — I5023 Acute on chronic systolic (congestive) heart failure: Secondary | ICD-10-CM | POA: Diagnosis present

## 2016-03-07 DIAGNOSIS — R6521 Severe sepsis with septic shock: Secondary | ICD-10-CM

## 2016-03-07 DIAGNOSIS — T402X5A Adverse effect of other opioids, initial encounter: Secondary | ICD-10-CM | POA: Diagnosis present

## 2016-03-07 DIAGNOSIS — Z789 Other specified health status: Secondary | ICD-10-CM | POA: Diagnosis not present

## 2016-03-07 DIAGNOSIS — E785 Hyperlipidemia, unspecified: Secondary | ICD-10-CM | POA: Diagnosis present

## 2016-03-07 DIAGNOSIS — I471 Supraventricular tachycardia: Secondary | ICD-10-CM | POA: Diagnosis present

## 2016-03-07 DIAGNOSIS — I272 Other secondary pulmonary hypertension: Secondary | ICD-10-CM | POA: Diagnosis present

## 2016-03-07 DIAGNOSIS — Z7902 Long term (current) use of antithrombotics/antiplatelets: Secondary | ICD-10-CM

## 2016-03-07 DIAGNOSIS — I200 Unstable angina: Secondary | ICD-10-CM

## 2016-03-07 DIAGNOSIS — Z9842 Cataract extraction status, left eye: Secondary | ICD-10-CM | POA: Diagnosis not present

## 2016-03-07 DIAGNOSIS — Z96641 Presence of right artificial hip joint: Secondary | ICD-10-CM | POA: Diagnosis present

## 2016-03-07 DIAGNOSIS — N184 Chronic kidney disease, stage 4 (severe): Secondary | ICD-10-CM | POA: Diagnosis not present

## 2016-03-07 DIAGNOSIS — I447 Left bundle-branch block, unspecified: Secondary | ICD-10-CM | POA: Diagnosis present

## 2016-03-07 DIAGNOSIS — I5021 Acute systolic (congestive) heart failure: Secondary | ICD-10-CM | POA: Diagnosis not present

## 2016-03-07 DIAGNOSIS — Z7982 Long term (current) use of aspirin: Secondary | ICD-10-CM

## 2016-03-07 DIAGNOSIS — M797 Fibromyalgia: Secondary | ICD-10-CM | POA: Diagnosis present

## 2016-03-07 DIAGNOSIS — J189 Pneumonia, unspecified organism: Secondary | ICD-10-CM

## 2016-03-07 DIAGNOSIS — R0602 Shortness of breath: Secondary | ICD-10-CM | POA: Diagnosis present

## 2016-03-07 DIAGNOSIS — Z66 Do not resuscitate: Secondary | ICD-10-CM | POA: Diagnosis not present

## 2016-03-07 DIAGNOSIS — J939 Pneumothorax, unspecified: Secondary | ICD-10-CM

## 2016-03-07 DIAGNOSIS — Z6825 Body mass index (BMI) 25.0-25.9, adult: Secondary | ICD-10-CM | POA: Diagnosis not present

## 2016-03-07 DIAGNOSIS — I214 Non-ST elevation (NSTEMI) myocardial infarction: Secondary | ICD-10-CM | POA: Diagnosis present

## 2016-03-07 DIAGNOSIS — Z9981 Dependence on supplemental oxygen: Secondary | ICD-10-CM | POA: Diagnosis not present

## 2016-03-07 DIAGNOSIS — Z452 Encounter for adjustment and management of vascular access device: Secondary | ICD-10-CM

## 2016-03-07 DIAGNOSIS — Z8501 Personal history of malignant neoplasm of esophagus: Secondary | ICD-10-CM | POA: Diagnosis not present

## 2016-03-07 DIAGNOSIS — E876 Hypokalemia: Secondary | ICD-10-CM | POA: Diagnosis present

## 2016-03-07 DIAGNOSIS — E1122 Type 2 diabetes mellitus with diabetic chronic kidney disease: Secondary | ICD-10-CM | POA: Diagnosis present

## 2016-03-07 DIAGNOSIS — M810 Age-related osteoporosis without current pathological fracture: Secondary | ICD-10-CM | POA: Diagnosis present

## 2016-03-07 DIAGNOSIS — I25110 Atherosclerotic heart disease of native coronary artery with unstable angina pectoris: Secondary | ICD-10-CM | POA: Diagnosis present

## 2016-03-07 DIAGNOSIS — Z87891 Personal history of nicotine dependence: Secondary | ICD-10-CM | POA: Diagnosis not present

## 2016-03-07 DIAGNOSIS — I472 Ventricular tachycardia: Secondary | ICD-10-CM | POA: Diagnosis present

## 2016-03-07 DIAGNOSIS — J810 Acute pulmonary edema: Secondary | ICD-10-CM

## 2016-03-07 DIAGNOSIS — E878 Other disorders of electrolyte and fluid balance, not elsewhere classified: Secondary | ICD-10-CM | POA: Diagnosis present

## 2016-03-07 DIAGNOSIS — Z961 Presence of intraocular lens: Secondary | ICD-10-CM | POA: Diagnosis present

## 2016-03-07 DIAGNOSIS — F419 Anxiety disorder, unspecified: Secondary | ICD-10-CM | POA: Diagnosis present

## 2016-03-07 DIAGNOSIS — M419 Scoliosis, unspecified: Secondary | ICD-10-CM | POA: Diagnosis present

## 2016-03-07 DIAGNOSIS — J9690 Respiratory failure, unspecified, unspecified whether with hypoxia or hypercapnia: Secondary | ICD-10-CM | POA: Diagnosis present

## 2016-03-07 DIAGNOSIS — E872 Acidosis: Secondary | ICD-10-CM | POA: Diagnosis present

## 2016-03-07 DIAGNOSIS — I5043 Acute on chronic combined systolic (congestive) and diastolic (congestive) heart failure: Secondary | ICD-10-CM | POA: Diagnosis present

## 2016-03-07 DIAGNOSIS — I130 Hypertensive heart and chronic kidney disease with heart failure and stage 1 through stage 4 chronic kidney disease, or unspecified chronic kidney disease: Secondary | ICD-10-CM | POA: Diagnosis present

## 2016-03-07 DIAGNOSIS — J151 Pneumonia due to Pseudomonas: Secondary | ICD-10-CM | POA: Diagnosis present

## 2016-03-07 DIAGNOSIS — D509 Iron deficiency anemia, unspecified: Secondary | ICD-10-CM | POA: Diagnosis present

## 2016-03-07 DIAGNOSIS — J440 Chronic obstructive pulmonary disease with acute lower respiratory infection: Secondary | ICD-10-CM | POA: Diagnosis present

## 2016-03-07 DIAGNOSIS — G92 Toxic encephalopathy: Secondary | ICD-10-CM | POA: Diagnosis present

## 2016-03-07 DIAGNOSIS — Z515 Encounter for palliative care: Secondary | ICD-10-CM | POA: Diagnosis not present

## 2016-03-07 DIAGNOSIS — J95811 Postprocedural pneumothorax: Secondary | ICD-10-CM | POA: Diagnosis not present

## 2016-03-07 DIAGNOSIS — L409 Psoriasis, unspecified: Secondary | ICD-10-CM | POA: Diagnosis present

## 2016-03-07 DIAGNOSIS — K219 Gastro-esophageal reflux disease without esophagitis: Secondary | ICD-10-CM | POA: Diagnosis present

## 2016-03-07 DIAGNOSIS — Z9841 Cataract extraction status, right eye: Secondary | ICD-10-CM

## 2016-03-07 DIAGNOSIS — D510 Vitamin B12 deficiency anemia due to intrinsic factor deficiency: Secondary | ICD-10-CM | POA: Diagnosis present

## 2016-03-07 DIAGNOSIS — I350 Nonrheumatic aortic (valve) stenosis: Secondary | ICD-10-CM | POA: Diagnosis not present

## 2016-03-07 DIAGNOSIS — J9621 Acute and chronic respiratory failure with hypoxia: Principal | ICD-10-CM | POA: Diagnosis present

## 2016-03-07 DIAGNOSIS — D649 Anemia, unspecified: Secondary | ICD-10-CM | POA: Diagnosis present

## 2016-03-07 DIAGNOSIS — N179 Acute kidney failure, unspecified: Secondary | ICD-10-CM | POA: Diagnosis present

## 2016-03-07 DIAGNOSIS — E46 Unspecified protein-calorie malnutrition: Secondary | ICD-10-CM | POA: Diagnosis present

## 2016-03-07 DIAGNOSIS — J9622 Acute and chronic respiratory failure with hypercapnia: Secondary | ICD-10-CM

## 2016-03-07 DIAGNOSIS — A419 Sepsis, unspecified organism: Secondary | ICD-10-CM | POA: Diagnosis present

## 2016-03-07 DIAGNOSIS — R579 Shock, unspecified: Secondary | ICD-10-CM

## 2016-03-07 DIAGNOSIS — J9601 Acute respiratory failure with hypoxia: Secondary | ICD-10-CM

## 2016-03-07 DIAGNOSIS — E1165 Type 2 diabetes mellitus with hyperglycemia: Secondary | ICD-10-CM | POA: Diagnosis present

## 2016-03-07 DIAGNOSIS — R570 Cardiogenic shock: Secondary | ICD-10-CM | POA: Diagnosis present

## 2016-03-07 DIAGNOSIS — I255 Ischemic cardiomyopathy: Secondary | ICD-10-CM | POA: Diagnosis present

## 2016-03-07 DIAGNOSIS — J9600 Acute respiratory failure, unspecified whether with hypoxia or hypercapnia: Secondary | ICD-10-CM

## 2016-03-07 DIAGNOSIS — I2510 Atherosclerotic heart disease of native coronary artery without angina pectoris: Secondary | ICD-10-CM

## 2016-03-07 DIAGNOSIS — J449 Chronic obstructive pulmonary disease, unspecified: Secondary | ICD-10-CM | POA: Diagnosis present

## 2016-03-07 DIAGNOSIS — I4729 Other ventricular tachycardia: Secondary | ICD-10-CM

## 2016-03-07 DIAGNOSIS — R079 Chest pain, unspecified: Secondary | ICD-10-CM | POA: Diagnosis not present

## 2016-03-07 DIAGNOSIS — J9620 Acute and chronic respiratory failure, unspecified whether with hypoxia or hypercapnia: Secondary | ICD-10-CM | POA: Diagnosis present

## 2016-03-07 DIAGNOSIS — I959 Hypotension, unspecified: Secondary | ICD-10-CM | POA: Diagnosis present

## 2016-03-07 LAB — I-STAT ARTERIAL BLOOD GAS, ED
ACID-BASE DEFICIT: 12 mmol/L — AB (ref 0.0–2.0)
ACID-BASE DEFICIT: 14 mmol/L — AB (ref 0.0–2.0)
Acid-base deficit: 13 mmol/L — ABNORMAL HIGH (ref 0.0–2.0)
BICARBONATE: 17.6 meq/L — AB (ref 20.0–24.0)
BICARBONATE: 14.5 meq/L — AB (ref 20.0–24.0)
BICARBONATE: 16.8 meq/L — AB (ref 20.0–24.0)
O2 SAT: 100 %
O2 SAT: 76 %
O2 Saturation: 93 %
PCO2 ART: 52.5 mmHg — AB (ref 35.0–45.0)
PO2 ART: 308 mmHg — AB (ref 80.0–100.0)
PO2 ART: 50 mmHg — AB (ref 80.0–100.0)
PO2 ART: 87 mmHg (ref 80.0–100.0)
Patient temperature: 36.3
Patient temperature: 97.3
TCO2: 19 mmol/L (ref 0–100)
TCO2: 16 mmol/L (ref 0–100)
TCO2: 18 mmol/L (ref 0–100)
pCO2 arterial: 55.8 mmHg — ABNORMAL HIGH (ref 35.0–45.0)
pCO2 arterial: 40.3 mmHg (ref 35.0–45.0)
pH, Arterial: 7.099 — CL (ref 7.350–7.450)
pH, Arterial: 7.16 — CL (ref 7.350–7.450)
pH, Arterial: 7.108 — CL (ref 7.350–7.450)

## 2016-03-07 LAB — CBG MONITORING, ED: GLUCOSE-CAPILLARY: 242 mg/dL — AB (ref 65–99)

## 2016-03-07 LAB — URINE MICROSCOPIC-ADD ON: Bacteria, UA: NONE SEEN

## 2016-03-07 LAB — CBC WITH DIFFERENTIAL/PLATELET
BASOS ABS: 0.1 10*3/uL (ref 0.0–0.1)
BASOS PCT: 1 %
Eosinophils Absolute: 0.4 10*3/uL (ref 0.0–0.7)
Eosinophils Relative: 3 %
HEMATOCRIT: 32.4 % — AB (ref 36.0–46.0)
Hemoglobin: 9.6 g/dL — ABNORMAL LOW (ref 12.0–15.0)
Lymphocytes Relative: 23 %
Lymphs Abs: 3.8 10*3/uL (ref 0.7–4.0)
MCH: 27.7 pg (ref 26.0–34.0)
MCHC: 29.6 g/dL — ABNORMAL LOW (ref 30.0–36.0)
MCV: 93.6 fL (ref 78.0–100.0)
MONO ABS: 1.4 10*3/uL — AB (ref 0.1–1.0)
Monocytes Relative: 9 %
NEUTROS ABS: 10.9 10*3/uL — AB (ref 1.7–7.7)
Neutrophils Relative %: 66 %
PLATELETS: 412 10*3/uL — AB (ref 150–400)
RBC: 3.46 MIL/uL — ABNORMAL LOW (ref 3.87–5.11)
RDW: 17.1 % — AB (ref 11.5–15.5)
WBC: 16.6 10*3/uL — ABNORMAL HIGH (ref 4.0–10.5)

## 2016-03-07 LAB — COMPREHENSIVE METABOLIC PANEL
ALBUMIN: 3.3 g/dL — AB (ref 3.5–5.0)
ALT: 13 U/L — ABNORMAL LOW (ref 14–54)
AST: 40 U/L (ref 15–41)
Alkaline Phosphatase: 74 U/L (ref 38–126)
Anion gap: 10 (ref 5–15)
BILIRUBIN TOTAL: 0.4 mg/dL (ref 0.3–1.2)
BUN: 33 mg/dL — AB (ref 6–20)
CHLORIDE: 110 mmol/L (ref 101–111)
CO2: 17 mmol/L — AB (ref 22–32)
Calcium: 9 mg/dL (ref 8.9–10.3)
Creatinine, Ser: 1.83 mg/dL — ABNORMAL HIGH (ref 0.44–1.00)
GFR calc Af Amer: 29 mL/min — ABNORMAL LOW (ref >60)
GFR calc non Af Amer: 25 mL/min — ABNORMAL LOW (ref >60)
GLUCOSE: 264 mg/dL — AB (ref 65–99)
POTASSIUM: 4.1 mmol/L (ref 3.5–5.1)
Sodium: 137 mmol/L (ref 135–145)
Total Protein: 6.9 g/dL (ref 6.5–8.1)

## 2016-03-07 LAB — I-STAT TROPONIN, ED: TROPONIN I, POC: 0.06 ng/mL (ref 0.00–0.08)

## 2016-03-07 LAB — URINALYSIS, ROUTINE W REFLEX MICROSCOPIC
BILIRUBIN URINE: NEGATIVE
Glucose, UA: NEGATIVE mg/dL
Hgb urine dipstick: NEGATIVE
Ketones, ur: NEGATIVE mg/dL
LEUKOCYTES UA: NEGATIVE
NITRITE: NEGATIVE
PROTEIN: 30 mg/dL — AB
Specific Gravity, Urine: 1.021 (ref 1.005–1.030)
pH: 5.5 (ref 5.0–8.0)

## 2016-03-07 LAB — PROTIME-INR
INR: 1.18 (ref 0.00–1.49)
INR: 1.31 (ref 0.00–1.49)
Prothrombin Time: 15.2 seconds (ref 11.6–15.2)
Prothrombin Time: 16.4 seconds — ABNORMAL HIGH (ref 11.6–15.2)

## 2016-03-07 LAB — I-STAT CG4 LACTIC ACID, ED
Lactic Acid, Venous: 1.69 mmol/L (ref 0.5–2.0)
Lactic Acid, Venous: 2.05 mmol/L (ref 0.5–2.0)

## 2016-03-07 LAB — I-STAT CHEM 8, ED
BUN: 35 mg/dL — AB (ref 6–20)
CALCIUM ION: 1.3 mmol/L (ref 1.13–1.30)
CHLORIDE: 113 mmol/L — AB (ref 101–111)
Creatinine, Ser: 1.8 mg/dL — ABNORMAL HIGH (ref 0.44–1.00)
Glucose, Bld: 249 mg/dL — ABNORMAL HIGH (ref 65–99)
HEMATOCRIT: 35 % — AB (ref 36.0–46.0)
Hemoglobin: 11.9 g/dL — ABNORMAL LOW (ref 12.0–15.0)
POTASSIUM: 4.2 mmol/L (ref 3.5–5.1)
SODIUM: 141 mmol/L (ref 135–145)
TCO2: 18 mmol/L (ref 0–100)

## 2016-03-07 LAB — PHOSPHORUS: PHOSPHORUS: 6.4 mg/dL — AB (ref 2.5–4.6)

## 2016-03-07 LAB — APTT: aPTT: 107 seconds — ABNORMAL HIGH (ref 24–37)

## 2016-03-07 LAB — GLUCOSE, CAPILLARY
GLUCOSE-CAPILLARY: 157 mg/dL — AB (ref 65–99)
Glucose-Capillary: 148 mg/dL — ABNORMAL HIGH (ref 65–99)

## 2016-03-07 LAB — INFLUENZA PANEL BY PCR (TYPE A & B)
H1N1 flu by pcr: NOT DETECTED
INFLBPCR: NEGATIVE
Influenza A By PCR: NEGATIVE

## 2016-03-07 LAB — MRSA PCR SCREENING: MRSA by PCR: NEGATIVE

## 2016-03-07 LAB — PROCALCITONIN

## 2016-03-07 LAB — TROPONIN I
TROPONIN I: 0.26 ng/mL — AB (ref <0.031)
Troponin I: 0.07 ng/mL — ABNORMAL HIGH (ref <0.031)
Troponin I: 30.66 ng/mL (ref <0.031)

## 2016-03-07 LAB — HEPARIN LEVEL (UNFRACTIONATED): HEPARIN UNFRACTIONATED: 0.45 [IU]/mL (ref 0.30–0.70)

## 2016-03-07 LAB — STREP PNEUMONIAE URINARY ANTIGEN: Strep Pneumo Urinary Antigen: NEGATIVE

## 2016-03-07 LAB — TRIGLYCERIDES: TRIGLYCERIDES: 93 mg/dL (ref <150)

## 2016-03-07 LAB — MAGNESIUM: Magnesium: 2.3 mg/dL (ref 1.7–2.4)

## 2016-03-07 LAB — BRAIN NATRIURETIC PEPTIDE: B NATRIURETIC PEPTIDE 5: 2333.7 pg/mL — AB (ref 0.0–100.0)

## 2016-03-07 MED ORDER — METOPROLOL TARTRATE 1 MG/ML IV SOLN
5.0000 mg | Freq: Four times a day (QID) | INTRAVENOUS | Status: DC
  Administered 2016-03-07 – 2016-03-09 (×5): 5 mg via INTRAVENOUS
  Filled 2016-03-07 (×8): qty 5

## 2016-03-07 MED ORDER — SENNA 8.6 MG PO TABS
1.0000 | ORAL_TABLET | Freq: Every day | ORAL | Status: DC
  Administered 2016-03-07 – 2016-03-10 (×4): 8.6 mg
  Filled 2016-03-07 (×4): qty 1

## 2016-03-07 MED ORDER — PROPOFOL 1000 MG/100ML IV EMUL
0.0000 ug/kg/min | INTRAVENOUS | Status: DC
  Filled 2016-03-07: qty 100

## 2016-03-07 MED ORDER — LEVOFLOXACIN IN D5W 750 MG/150ML IV SOLN
750.0000 mg | Freq: Once | INTRAVENOUS | Status: AC
Start: 2016-03-07 — End: 2016-03-07
  Administered 2016-03-07: 750 mg via INTRAVENOUS
  Filled 2016-03-07: qty 150

## 2016-03-07 MED ORDER — IPRATROPIUM-ALBUTEROL 0.5-2.5 (3) MG/3ML IN SOLN
3.0000 mL | Freq: Four times a day (QID) | RESPIRATORY_TRACT | Status: DC
Start: 2016-03-07 — End: 2016-03-07
  Administered 2016-03-07: 3 mL via RESPIRATORY_TRACT
  Filled 2016-03-07: qty 3

## 2016-03-07 MED ORDER — FENTANYL CITRATE (PF) 100 MCG/2ML IJ SOLN
50.0000 ug | INTRAMUSCULAR | Status: DC | PRN
  Filled 2016-03-07: qty 2

## 2016-03-07 MED ORDER — HEPARIN (PORCINE) IN NACL 100-0.45 UNIT/ML-% IJ SOLN
900.0000 [IU]/h | INTRAMUSCULAR | Status: DC
  Administered 2016-03-07: 700 [IU]/h via INTRAVENOUS
  Administered 2016-03-08 – 2016-03-09 (×2): 900 [IU]/h via INTRAVENOUS
  Filled 2016-03-07 (×7): qty 250

## 2016-03-07 MED ORDER — IPRATROPIUM-ALBUTEROL 0.5-2.5 (3) MG/3ML IN SOLN
3.0000 mL | Freq: Four times a day (QID) | RESPIRATORY_TRACT | Status: DC
  Administered 2016-03-07 – 2016-03-10 (×11): 3 mL via RESPIRATORY_TRACT
  Filled 2016-03-07 (×12): qty 3

## 2016-03-07 MED ORDER — ETOMIDATE 2 MG/ML IV SOLN
INTRAVENOUS | Status: AC | PRN
  Administered 2016-03-07: 20 mg via INTRAVENOUS

## 2016-03-07 MED ORDER — LEVOFLOXACIN IN D5W 750 MG/150ML IV SOLN
750.0000 mg | INTRAVENOUS | Status: DC
  Filled 2016-03-07: qty 150

## 2016-03-07 MED ORDER — CHLORHEXIDINE GLUCONATE 0.12% ORAL RINSE (MEDLINE KIT)
15.0000 mL | Freq: Two times a day (BID) | OROMUCOSAL | Status: DC
  Administered 2016-03-07 – 2016-03-11 (×7): 15 mL via OROMUCOSAL

## 2016-03-07 MED ORDER — FENTANYL CITRATE (PF) 100 MCG/2ML IJ SOLN
50.0000 ug | INTRAMUSCULAR | Status: DC | PRN
  Administered 2016-03-08 (×3): 50 ug via INTRAVENOUS
  Administered 2016-03-09: 12.5 ug via INTRAVENOUS
  Administered 2016-03-09: 50 ug via INTRAVENOUS
  Filled 2016-03-07 (×4): qty 2

## 2016-03-07 MED ORDER — ATORVASTATIN CALCIUM 80 MG PO TABS
80.0000 mg | ORAL_TABLET | Freq: Every day | ORAL | Status: DC
Start: 2016-03-07 — End: 2016-03-11
  Administered 2016-03-08 – 2016-03-11 (×4): 80 mg
  Filled 2016-03-07 (×4): qty 1

## 2016-03-07 MED ORDER — CLOPIDOGREL BISULFATE 75 MG PO TABS
75.0000 mg | ORAL_TABLET | Freq: Every day | ORAL | Status: DC
  Administered 2016-03-07 – 2016-03-11 (×5): 75 mg
  Filled 2016-03-07 (×5): qty 1

## 2016-03-07 MED ORDER — IPRATROPIUM-ALBUTEROL 0.5-2.5 (3) MG/3ML IN SOLN
3.0000 mL | Freq: Once | RESPIRATORY_TRACT | Status: AC
  Administered 2016-03-07: 3 mL via RESPIRATORY_TRACT
  Filled 2016-03-07: qty 3

## 2016-03-07 MED ORDER — BUDESONIDE 0.5 MG/2ML IN SUSP
0.5000 mg | Freq: Two times a day (BID) | RESPIRATORY_TRACT | Status: DC
  Administered 2016-03-07 – 2016-03-15 (×17): 0.5 mg via RESPIRATORY_TRACT
  Filled 2016-03-07 (×18): qty 2

## 2016-03-07 MED ORDER — DEXTROSE 5 % IV SOLN
500.0000 mg | Freq: Three times a day (TID) | INTRAVENOUS | Status: DC
  Administered 2016-03-07 – 2016-03-10 (×9): 500 mg via INTRAVENOUS
  Filled 2016-03-07 (×11): qty 0.5

## 2016-03-07 MED ORDER — LEVOFLOXACIN IN D5W 750 MG/150ML IV SOLN: 750.0000 mg | INTRAVENOUS | Status: DC

## 2016-03-07 MED ORDER — ASPIRIN 300 MG RE SUPP
300.0000 mg | Freq: Once | RECTAL | Status: AC
  Administered 2016-03-07: 300 mg via RECTAL
  Filled 2016-03-07: qty 1

## 2016-03-07 MED ORDER — HEPARIN BOLUS VIA INFUSION
3000.0000 [IU] | Freq: Once | INTRAVENOUS | Status: AC
  Administered 2016-03-07: 3000 [IU] via INTRAVENOUS
  Filled 2016-03-07: qty 3000

## 2016-03-07 MED ORDER — SODIUM BICARBONATE 8.4 % IV SOLN
INTRAVENOUS | Status: DC
  Administered 2016-03-07 – 2016-03-08 (×3): via INTRAVENOUS
  Filled 2016-03-07 (×4): qty 1000

## 2016-03-07 MED ORDER — FUROSEMIDE 10 MG/ML IJ SOLN
40.0000 mg | Freq: Once | INTRAMUSCULAR | Status: AC
  Administered 2016-03-07: 40 mg via INTRAVENOUS
  Filled 2016-03-07: qty 4

## 2016-03-07 MED ORDER — AZTREONAM 2 G IJ SOLR: 2.0000 g | Freq: Once | INTRAMUSCULAR | Status: DC

## 2016-03-07 MED ORDER — CYANOCOBALAMIN 1000 MCG/ML IJ SOLN
1000.0000 ug | INTRAMUSCULAR | Status: DC
Start: 2016-03-07 — End: 2016-03-08

## 2016-03-07 MED ORDER — PROPOFOL 1000 MG/100ML IV EMUL
5.0000 ug/kg/min | INTRAVENOUS | Status: DC
  Administered 2016-03-07: 5 ug/kg/min via INTRAVENOUS
  Filled 2016-03-07: qty 100

## 2016-03-07 MED ORDER — LEVOFLOXACIN IN D5W 500 MG/100ML IV SOLN
500.0000 mg | INTRAVENOUS | Status: DC
  Administered 2016-03-09: 500 mg via INTRAVENOUS
  Filled 2016-03-07: qty 100

## 2016-03-07 MED ORDER — SODIUM CHLORIDE 0.9 % IV SOLN
250.0000 mL | INTRAVENOUS | Status: DC | PRN
Start: 2016-03-07 — End: 2016-03-16

## 2016-03-07 MED ORDER — SALINE SPRAY 0.65 % NA SOLN
1.0000 | NASAL | Status: DC | PRN
  Filled 2016-03-07: qty 44

## 2016-03-07 MED ORDER — ARFORMOTEROL TARTRATE 15 MCG/2ML IN NEBU
15.0000 ug | INHALATION_SOLUTION | Freq: Two times a day (BID) | RESPIRATORY_TRACT | Status: DC
  Administered 2016-03-07 – 2016-03-15 (×17): 15 ug via RESPIRATORY_TRACT
  Filled 2016-03-07 (×18): qty 2

## 2016-03-07 MED ORDER — AZTREONAM 2 G IJ SOLR: 2.0000 g | Freq: Three times a day (TID) | INTRAMUSCULAR | Status: DC

## 2016-03-07 MED ORDER — METHYLPREDNISOLONE SODIUM SUCC 125 MG IJ SOLR
125.0000 mg | Freq: Once | INTRAMUSCULAR | Status: AC
  Administered 2016-03-07: 125 mg via INTRAVENOUS
  Filled 2016-03-07: qty 2

## 2016-03-07 MED ORDER — PANTOPRAZOLE SODIUM 40 MG IV SOLR
40.0000 mg | Freq: Every day | INTRAVENOUS | Status: DC
  Administered 2016-03-08 – 2016-03-12 (×5): 40 mg via INTRAVENOUS
  Filled 2016-03-07 (×5): qty 40

## 2016-03-07 MED ORDER — ASPIRIN 81 MG PO CHEW: 324.0000 mg | CHEWABLE_TABLET | ORAL | Status: AC

## 2016-03-07 MED ORDER — LORATADINE 10 MG PO TABS
10.0000 mg | ORAL_TABLET | Freq: Every morning | ORAL | Status: DC
Start: 2016-03-07 — End: 2016-03-11
  Administered 2016-03-08 – 2016-03-11 (×4): 10 mg
  Filled 2016-03-07 (×4): qty 1

## 2016-03-07 MED ORDER — HYPROMELLOSE (GONIOSCOPIC) 2.5 % OP SOLN: 1.0000 [drp] | Freq: Three times a day (TID) | OPHTHALMIC | Status: DC | PRN

## 2016-03-07 MED ORDER — INSULIN ASPART 100 UNIT/ML ~~LOC~~ SOLN
0.0000 [IU] | SUBCUTANEOUS | Status: DC
  Administered 2016-03-07: 2 [IU] via SUBCUTANEOUS
  Administered 2016-03-07: 3 [IU] via SUBCUTANEOUS
  Administered 2016-03-07: 2 [IU] via SUBCUTANEOUS
  Administered 2016-03-08: 3 [IU] via SUBCUTANEOUS
  Administered 2016-03-08: 2 [IU] via SUBCUTANEOUS
  Administered 2016-03-08: 3 [IU] via SUBCUTANEOUS
  Administered 2016-03-08: 2 [IU] via SUBCUTANEOUS

## 2016-03-07 MED ORDER — ROCURONIUM BROMIDE 50 MG/5ML IV SOLN
INTRAVENOUS | Status: AC | PRN
  Administered 2016-03-07: 100 mg via INTRAVENOUS

## 2016-03-07 MED ORDER — ASPIRIN 300 MG RE SUPP
300.0000 mg | RECTAL | Status: AC
  Administered 2016-03-07: 300 mg via RECTAL
  Filled 2016-03-07: qty 1

## 2016-03-07 MED ORDER — PHENYLEPHRINE HCL 10 MG/ML IJ SOLN
50.0000 ug/min | INTRAVENOUS | Status: DC
  Administered 2016-03-09: 10 ug/min via INTRAVENOUS
  Administered 2016-03-10: 25 ug/min via INTRAVENOUS
  Filled 2016-03-07 (×3): qty 1

## 2016-03-07 MED ORDER — ANTISEPTIC ORAL RINSE SOLUTION (CORINZ)
7.0000 mL | Freq: Four times a day (QID) | OROMUCOSAL | Status: DC
  Administered 2016-03-07 – 2016-03-11 (×12): 7 mL via OROMUCOSAL

## 2016-03-07 MED ORDER — DEXTROSE 5 % IV SOLN
500.0000 mg | Freq: Three times a day (TID) | INTRAVENOUS | Status: DC
  Filled 2016-03-07: qty 0.5

## 2016-03-07 NOTE — ED Notes (Signed)
Pt arrived via [facility] with c/o SOB. Pt's neighbor called EMS when pt reported feeling weak and sob. Pt has COPD, wears 4L [place] at home. Per EMS, pt's sats were 88% on arrival to home, when she was placed on a NRB and given 8 mg Morphine. Pt awake with physical stimulation, able to speak name and speak in short sentences.

## 2016-03-07 NOTE — ED Provider Notes (Signed)
CSN: [id number]     Arrival date & time 03/08/2016  [id number] History   First MD Initiated Contact with Patient 03/12/2016 [phone]     Chief Complaint  Patient presents with  . Shortness of Breath     (Consider location/radiation/quality/duration/timing/severity/associated sxs/prior Treatment) HPI Comments: Level 5 caveat for acuity of Condition. Patient arrives via EMS with respiratory distress. She is minimally  responsive. She reportedly called EMS for shortness of breath and feeling weak as well as chest pain. Patient's oxygenation level was 88% on 4 L on EMS arrival. They gave her 8 mg of morphine for chest pain. She is now obtunded and will not awaken. She is nonverbal and unresponsive on arrival. EMS reports she has a history of CAD but has been declined for bypass in the past. EKG showed new left bundle branch block.  The history is provided by the patient and the EMS personnel. The history is limited by the condition of the patient.    Past Medical History  Diagnosis Date  . Fibromyalgia   . Skin lesion     chronic calcific cutaneous lesions  . HX of multiple bleeding ulcers 09/01/2011  . Hypertension   . Emphysema   . Dizziness     d/t crystals in ears that "roll around"and can't get them out  . Osteoporosis   . Scoliosis   . Psoriasis   . H/O hiatal hernia   . GERD (gastroesophageal reflux disease)   . Gastric ulcer   . Diverticulosis   . History of colonic polyps   . Shingles   . Vitamin B deficiency   . COPD (chronic obstructive pulmonary disease) ([place])   . On home oxygen therapy     "2L;  24/7" (05/14/2015)  . Left humeral fracture 04/26/2015  . Scoliosis   . Complication of anesthesia     shortness of breath and pain with gas   . Pneumonia 2015 X 3  . Chronic bronchitis ([place])     "I about keep it"  . History of blood transfusion     "related to my anemia"  . Osteoarthritis   . Arthritis     "qwhere" (05/14/2015)  . Chronic upper back pain   . Kidney stone 25+yrs ago     "passed them"  . Adenocarcinoma of gastroesophageal junction ([place])   . Pernicious anemia   . Iron deficiency anemia   . Anemia, B12 deficiency     "get shots once/month" (05/14/2015)  . Chronic combined systolic (congestive) and diastolic (congestive) heart failure ([place])     a. 2D echo 05/12/15: EF 40-45, diffuse HK, grade 2 DD, moderate AS, mild MR, mod LAE, PASP 24mmHg.  Marland Kitchen Coronary artery disease     a. Severe 3 vessel ASCAD felt to be a poor candidate for CABG given her advanced age, severe COPD, chronic steroid-induced use and poor mobility. Not a good candidate for PCI either.  . Aortic stenosis     a. Mod by echo 05/2015.  Marland Kitchen Hyperlipidemia   . Pulmonary hypertension [facility])    Past Surgical History  Procedure Laterality Date  . Total hip arthroplasty Right 2001  . Tonsillectomy      as a child  . Tubal ligation  1975  . Appendectomy  1975  . Cataract extraction w/ intraocular lens  implant, bilateral Bilateral 10+yrs ago  . Esophagogastroduodenoscopy    . Colonoscopy    . Partial esophagectomy  12/20/2011    Procedure: ESOPHAGECTOMY PARTIAL;  Surgeon: Pierre Bali,  MD;  Location: [facility];  Service: Thoracic;  Laterality: N/A;  . Total hip arthroplasty  06/30/2012    Procedure: TOTAL HIP ARTHROPLASTY ANTERIOR APPROACH;  Surgeon: Mcarthur Rossetti, MD;  Location: [facility];  Service: Orthopedics;  Laterality: Left;  Left total hip arthroplasty  . Shoulder closed reduction  04/26/2015    Procedure: CLOSED MANIPULATION SHOULDER;  Surgeon: Meredith Pel, MD;  Location: [facility];  Service: Orthopedics;;  . Cardiac catheterization N/A 05/14/2015    Procedure: Right/Left Heart Cath and Coronary Angiography;  Surgeon: Burnell Blanks, MD;  Location: [facility];  Service: Cardiovascular;  Laterality: N/A;  . Joint replacement    . Dilation and curettage of uterus    . Back surgery    . Fixation kyphoplasty thoracic spine  X 3    "broke my back in 6 places related to  fall/coughing; 3 different cases"  . Orif humerus fracture Left 05/15/2015    Procedure: Left OPEN REDUCTION INTERNAL FIXATION (ORIF) PROXIMAL HUMERUS FRACTURE;  Surgeon: Meredith Pel, MD;  Location: [facility];  Service: Orthopedics;  Laterality: Left;   Family History  Problem Relation Age of Onset  . Cancer Maternal Grandmother     gynecologic  . Cancer Maternal Aunt     gynecologic  . Breast cancer Paternal Aunt   . Breast cancer Paternal Aunt   . Breast cancer Paternal Aunt   . Anesthesia problems Neg Hx   . Hypotension Neg Hx   . Malignant hyperthermia Neg Hx   . Pseudochol deficiency Neg Hx   . Heart attack Neg Hx    Social History  Substance Use Topics  . Smoking status: Former Smoker -- 3.00 packs/day for 45 years    Types: Cigarettes    Quit date: 12/07/1991  . Smokeless tobacco: Never Used  . Alcohol Use: No   OB History    No data available     Review of Systems  Unable to perform ROS: Severe respiratory distress  Respiratory: Positive for shortness of breath.       Allergies  Keflex; Nulecit; Biaxin; Spiriva handihaler; Tramadol; Adhesive; Clarithromycin; Darvon; Epinephrine; Fentanyl; Morphine and related; Oxycodone; Sulfa antibiotics; and Vicodin  Home Medications   Prior to Admission medications   Medication Sig Start Date End Date Taking? Authorizing Provider  acetaminophen (TYLENOL) 500 MG tablet Take 500 mg by mouth daily.    Historical Provider, MD  albuterol (PROVENTIL HFA;VENTOLIN HFA) 108 (90 BASE) MCG/ACT inhaler Inhale 2 puffs into the lungs every 4 (four) hours as needed for wheezing (wheezing). 04/29/15   Shanker Kristeen Mans, MD  albuterol (PROVENTIL) (2.5 MG/3ML) 0.083% nebulizer solution Take 3 mLs (2.5 mg total) by nebulization every 2 (two) hours as needed for wheezing or shortness of breath. 04/29/15   Jonetta Osgood, MD  aspirin EC 81 MG tablet Take 81 mg by mouth every morning.     Historical Provider, MD  atenolol (TENORMIN) 25 MG  tablet Take 25 mg by mouth daily.    Historical Provider, MD  atorvastatin (LIPITOR) 80 MG tablet Take 1 tablet (80 mg total) by mouth daily at 6 PM. 05/20/15   Almyra Deforest, PA  BREO ELLIPTA 200-25 MCG/INH AEPB Inhale 1 puff into the lungs daily. 02/13/16   Historical Provider, MD  clopidogrel (PLAVIX) 75 MG tablet Take 1 tablet (75 mg total) by mouth daily. 05/21/15   Almyra Deforest, PA  cyanocobalamin (,VITAMIN B-12,) 1000 MCG/ML injection Inject 1,000 mcg into the muscle every 30 (  thirty) days.     Historical Provider, MD  DULoxetine (CYMBALTA) 30 MG capsule Take 30 mg by mouth daily. 02/16/16   Historical Provider, MD  ergocalciferol (VITAMIN D2) 50000 UNITS capsule Take 50,000 Units by mouth every 14 (fourteen) days.     Historical Provider, MD  esomeprazole (NEXIUM) 20 MG capsule Take 20 mg by mouth daily before breakfast.     Historical Provider, MD  furosemide (LASIX) 20 MG tablet Take 20 mg by mouth every morning. Patient also takes Lasix 10 mg in PM.    Historical Provider, MD  guaiFENesin (MUCINEX) 600 MG 12 hr tablet Take 1 tablet (600 mg total) by mouth 2 (two) times daily. 04/29/15   Shanker Kristeen Mans, MD  hydroxypropyl methylcellulose (ISOPTO TEARS) 2.5 % ophthalmic solution Place 1 drop into both eyes 3 (three) times daily as needed for dry eyes.    Historical Provider, MD  ibuprofen (ADVIL,MOTRIN) 200 MG tablet Take 400 mg by mouth daily as needed (pain).     Historical Provider, MD  ipratropium-albuterol (DUONEB) 0.5-2.5 (3) MG/3ML SOLN Take 3 mLs by nebulization every 6 (six) hours. 04/29/15   Shanker Kristeen Mans, MD  isosorbide mononitrate (IMDUR) 60 MG 24 hr tablet Take 1 tablet (60 mg total) by mouth 2 (two) times daily. 11/11/15   Sueanne Margarita, MD  loratadine (CLARITIN) 10 MG tablet Take 10 mg by mouth every morning.     Historical Provider, MD  LORazepam (ATIVAN) 0.5 MG tablet Take 1 tablet (0.5 mg total) by mouth every 8 (eight) hours as needed for anxiety. 05/20/15   Almyra Deforest, PA  losartan  (COZAAR) 50 MG tablet Take 1 tablet (50 mg total) by mouth daily. 10/27/15   Dayna N Dunn, PA-C  nitroGLYCERIN (NITROSTAT) 0.4 MG SL tablet Place 1 tablet (0.4 mg total) under the tongue every 5 (five) minutes as needed for chest pain. 10/27/15   Dayna N Dunn, PA-C  nystatin (MYCOSTATIN) 100000 UNIT/ML suspension TAKE 4-5 ML FOUR TIMES A DAY MOUTH/THROAT 20 DAYS 09/24/15   Historical Provider, MD  OXYGEN Inhale 2 L into the lungs. As needed per pt    Historical Provider, MD  senna (SENOKOT) 8.6 MG TABS tablet Take 1 tablet (8.6 mg total) by mouth at bedtime. 04/29/15   Shanker Kristeen Mans, MD  sodium chloride (OCEAN) 0.65 % SOLN nasal spray Place 1 spray into both nostrils as needed for congestion. 05/02/14   Modena Jansky, MD  [facility] WORT PO Take 1 capsule by mouth 2 (two) times daily.    Historical Provider, MD  tiZANidine (ZANAFLEX) 4 MG tablet Take 0.5-1 tablets (2-4 mg total) by mouth every 8 (eight) hours as needed for muscle spasms. 04/29/15   Shanker Kristeen Mans, MD  zoledronic acid (RECLAST) 5 MG/100ML SOLN injection Inject 5 mg into the vein once.    Historical Provider, MD   There were no vitals taken for this visit. Physical Exam  Constitutional: She is oriented to person, place, and time. She appears distressed.  Unresponsive, agonal respirations, severe distress  HENT:  Head: Normocephalic and atraumatic.  Mouth/Throat: Oropharynx is clear and moist.  Eyes: Conjunctivae are normal. Pupils are equal, round, and reactive to light.  Neck: Normal range of motion. Neck supple.  Cardiovascular: Normal rate.   Murmur heard. tachycardic  Pulmonary/Chest: Breath sounds normal. She is in respiratory distress. She exhibits no tenderness.  Diminished breath sounds throughout  Abdominal: Soft. There is no tenderness. There is no rebound and no guarding.  Musculoskeletal: Normal range of motion. She exhibits no edema or tenderness.  Neurological: She is alert and oriented to person, place,  and time.  Unresponsive, not following commands    ED Course  .Intubation Date/Time: 03/08/2016 10:20 AM Performed by: Ezequiel Essex Authorized by: Ezequiel Essex Consent: The procedure was performed in an emergent situation. Verbal consent obtained. Time out: Immediately prior to procedure a "time out" was called to verify the correct patient, procedure, equipment, support staff and site/side marked as required. Indications: respiratory failure,  airway protection and  respiratory distress Intubation method: video-assisted Patient status: paralyzed (RSI) Preoxygenation: nonrebreather mask Sedatives: etomidate Paralytic: rocuronium Laryngoscope size: Mac 4 Tube size: 7.5 mm Tube type: cuffed Number of attempts: 1 Ventilation between attempts: BVM Cricoid pressure: no Cords visualized: yes Post-procedure assessment: chest rise and ETCO2 monitor Breath sounds: equal Cuff inflated: yes ETT to lip: 22 cm Tube secured with: ETT holder Chest x-ray interpreted by me. Chest x-ray findings: endotracheal tube in appropriate position Patient tolerance: Patient tolerated the procedure well with no immediate complications   (including critical care time) Labs Review Labs Reviewed  CBC WITH DIFFERENTIAL/PLATELET - Abnormal; Notable for the following:    WBC 16.6 (*)    RBC 3.46 (*)    Hemoglobin 9.6 (*)    HCT 32.4 (*)    MCHC 29.6 (*)    RDW 17.1 (*)    Platelets 412 (*)    Neutro Abs 10.9 (*)    Monocytes Absolute 1.4 (*)    All other components within normal limits  I-STAT CHEM 8, ED - Abnormal; Notable for the following:    Chloride 113 (*)    BUN 35 (*)    Creatinine, Ser 1.80 (*)    Glucose, Bld 249 (*)    Hemoglobin 11.9 (*)    HCT 35.0 (*)    All other components within normal limits  COMPREHENSIVE METABOLIC PANEL  PROTIME-INR  TROPONIN I  BRAIN NATRIURETIC PEPTIDE  URINALYSIS, ROUTINE W REFLEX MICROSCOPIC (NOT AT [facility])  I-STAT CG4 LACTIC ACID, ED  I-STAT  TROPOININ, ED  I-STAT ARTERIAL BLOOD GAS, ED    Imaging Review No results found. I have personally reviewed and evaluated these images and lab results as part of my medical decision-making.   EKG Interpretation   Date/Time:  Sunday March 07 2016 09:49:57 EDT Ventricular Rate:  127 PR Interval:  129 QRS Duration: 152 QT Interval:  330 QTC Calculation: 480 R Axis:   -92 Text Interpretation:  Sinus or ectopic atrial tachycardia Left bundle  branch block ** ** Consider ACUTE MI if LBBB is new ** ** d/w Dr. Claiborne Billings  Confirmed by Wyvonnia Dusky  MD, [place] [id number]) on 03/23/2016 10:12:28 AM      MDM   Final diagnoses:  None   Patient with severe respiratory distress. She is obtunded and unresponsive on arrival. saturations in the 70s on nonrebreather. She was emergently intubated. Discussed with her son Mr.  Geroge Baseman over the phone who states she is a full code.  EKG with new left bundle branch block. Reviewed with Dr. Claiborne Billings. He agrees this does not meet STEMI criteria.  Chart review: hx of HTN, diastolic HF, GERD, pernicious anemia, prior GI bleed, esophageal adenocarcinoma, COPD, remote history of tobacco abuse. She also has severe multivessel CAD including significant ost LM and mid RCA disease which would be best approached by bypass surgery, however she was felt to be a poor candidate for CABG given her advanced age, severe COPD, chronic steroid-induced  use and poor mobility  CXR with edema. Lasix given.  Acidosis on ABG but no significant CO2 retention.  Afebrile.   HR improved. EKG with global ST depression and ST elevation in aVR.  Suspect global ischemia.  Dw Dr. Haroldine Laws who will consult.  Start heparin gtt.  Unclear if MI led to edema or underlying pulmonary issue. ICU admission d/w Dr. Nelda Marseille.   CRITICAL CARE Performed by: Ezequiel Essex Total critical care time: 45 minutes Critical care time was exclusive of separately billable procedures and treating other  patients. Critical care was necessary to treat or prevent imminent or life-threatening deterioration. Critical care was time spent personally by me on the following activities: development of treatment plan with patient and/or surrogate as well as nursing, discussions with consultants, evaluation of patient's response to treatment, examination of patient, obtaining history from patient or surrogate, ordering and performing treatments and interventions, ordering and review of laboratory studies, ordering and review of radiographic studies, pulse oximetry and re-evaluation of patient's condition.    Ezequiel Essex, MD 03/15/2016 [id number]

## 2016-03-07 NOTE — H&P (Signed)
PULMONARY / CRITICAL CARE MEDICINE   Name: LUDA VANDEPUTTE MRN: [id number] DOB: Mar 01, 1934    ADMISSION DATE:  03/25/2016 CONSULTATION DATE:  4/2  REFERRING MD:  [place]   CHIEF COMPLAINT:  Acute respiratory failure   HISTORY OF PRESENT ILLNESS:   This is a 82 year old pt w/ chronic respiratory failure f/b [place] for COPD. Called EMS w/ CC weakness, shortness of breath and CP. Sats were 88%. PT given 8 mg morphine and 100% NRB. She was obtunded by time she arrived in ER she was unresponsive and presumed hypercarbic. She was therefore intubated and PCCM asked to admit.   PAST MEDICAL HISTORY :  She  has a past medical history of Fibromyalgia; Skin lesion; HX of multiple bleeding ulcers (09/01/2011); Hypertension; Emphysema; Dizziness; Osteoporosis; Scoliosis; Psoriasis; H/O hiatal hernia; GERD (gastroesophageal reflux disease); Gastric ulcer; Diverticulosis; History of colonic polyps; Shingles; Vitamin B deficiency; COPD (chronic obstructive pulmonary disease) ([place]); On home oxygen therapy; Left humeral fracture (04/26/2015); Scoliosis; Complication of anesthesia; Pneumonia (2015 X 3); Chronic bronchitis ([place]); History of blood transfusion; Osteoarthritis; Arthritis; Chronic upper back pain; Kidney stone (25+yrs ago); Adenocarcinoma of gastroesophageal junction ([place]); Pernicious anemia; Iron deficiency anemia; Anemia, B12 deficiency; Chronic combined systolic (congestive) and diastolic (congestive) heart failure ([place]); Coronary artery disease; Aortic stenosis; Hyperlipidemia; and Pulmonary hypertension ([place]).  PAST SURGICAL HISTORY: She  has past surgical history that includes Total hip arthroplasty (Right, 2001); Tonsillectomy; Tubal ligation (1975); Appendectomy (1975); Cataract extraction w/ intraocular lens  implant, bilateral (Bilateral, 10+yrs ago); Esophagogastroduodenoscopy; Colonoscopy; Partial esophagectomy (12/20/2011); Total hip arthroplasty (06/30/2012); Shoulder Closed Reduction  (04/26/2015); Cardiac catheterization (N/A, 05/14/2015); Joint replacement; Dilation and curettage of uterus; Back surgery; Fixation kyphoplasty thoracic spine (X 3); and ORIF humerus fracture (Left, 05/15/2015).  Allergies  Allergen Reactions  . Keflex [Cephalexin] Itching    "severe itching"  . Nulecit [Na Ferric Gluc Cplx In Sucrose] Diarrhea, Nausea Only and Other (See Comments)      drop in BP  . Biaxin [Clarithromycin] Itching  . Spiriva Handihaler [Tiotropium Bromide Monohydrate] Other (See Comments)    constipation  . Tramadol Itching    Skin burning and itching  . Adhesive [Tape] Itching  . Clarithromycin Itching  . Darvon Itching  . Epinephrine Itching  . Fentanyl Itching  . Morphine And Related Itching  . Oxycodone Itching  . Sulfa Antibiotics Itching  . Vicodin [Hydrocodone-Acetaminophen] Itching    Can take with Benadryl    No current facility-administered medications on file prior to encounter.   Current Outpatient Prescriptions on File Prior to Encounter  Medication Sig  . acetaminophen (TYLENOL) 500 MG tablet Take 500 mg by mouth daily.  Marland Kitchen albuterol (PROVENTIL HFA;VENTOLIN HFA) 108 (90 BASE) MCG/ACT inhaler Inhale 2 puffs into the lungs every 4 (four) hours as needed for wheezing (wheezing).  Marland Kitchen albuterol (PROVENTIL) (2.5 MG/3ML) 0.083% nebulizer solution Take 3 mLs (2.5 mg total) by nebulization every 2 (two) hours as needed for wheezing or shortness of breath.  Marland Kitchen aspirin EC 81 MG tablet Take 81 mg by mouth every morning.   Marland Kitchen atenolol (TENORMIN) 25 MG tablet Take 25 mg by mouth daily.  Marland Kitchen atorvastatin (LIPITOR) 80 MG tablet Take 1 tablet (80 mg total) by mouth daily at 6 PM.  . BREO ELLIPTA 200-25 MCG/INH AEPB Inhale 1 puff into the lungs daily.  . clopidogrel (PLAVIX) 75 MG tablet Take 1 tablet (75 mg total) by mouth daily.  . cyanocobalamin (,VITAMIN B-12,) 1000 MCG/ML injection Inject 1,000 mcg into the  muscle every 30 (thirty) days.   . DULoxetine (CYMBALTA) 30 MG  capsule Take 30 mg by mouth daily.  . ergocalciferol (VITAMIN D2) 50000 UNITS capsule Take 50,000 Units by mouth every 14 (fourteen) days.   Marland Kitchen esomeprazole (NEXIUM) 20 MG capsule Take 20 mg by mouth daily before breakfast.   . furosemide (LASIX) 20 MG tablet Take 20 mg by mouth every morning. Patient also takes Lasix 10 mg in PM.  . guaiFENesin (MUCINEX) 600 MG 12 hr tablet Take 1 tablet (600 mg total) by mouth 2 (two) times daily.  . hydroxypropyl methylcellulose (ISOPTO TEARS) 2.5 % ophthalmic solution Place 1 drop into both eyes 3 (three) times daily as needed for dry eyes.  Marland Kitchen ibuprofen (ADVIL,MOTRIN) 200 MG tablet Take 400 mg by mouth daily as needed (pain).   Marland Kitchen ipratropium-albuterol (DUONEB) 0.5-2.5 (3) MG/3ML SOLN Take 3 mLs by nebulization every 6 (six) hours.  . isosorbide mononitrate (IMDUR) 60 MG 24 hr tablet Take 1 tablet (60 mg total) by mouth 2 (two) times daily.  Marland Kitchen loratadine (CLARITIN) 10 MG tablet Take 10 mg by mouth every morning.   Marland Kitchen LORazepam (ATIVAN) 0.5 MG tablet Take 1 tablet (0.5 mg total) by mouth every 8 (eight) hours as needed for anxiety.  Marland Kitchen losartan (COZAAR) 50 MG tablet Take 1 tablet (50 mg total) by mouth daily.  . nitroGLYCERIN (NITROSTAT) 0.4 MG SL tablet Place 1 tablet (0.4 mg total) under the tongue every 5 (five) minutes as needed for chest pain.  Marland Kitchen nystatin (MYCOSTATIN) 100000 UNIT/ML suspension TAKE 4-5 ML FOUR TIMES A DAY MOUTH/THROAT 20 DAYS  . OXYGEN Inhale 2 L into the lungs. As needed per pt  . senna (SENOKOT) 8.6 MG TABS tablet Take 1 tablet (8.6 mg total) by mouth at bedtime.  . sodium chloride (OCEAN) 0.65 % SOLN nasal spray Place 1 spray into both nostrils as needed for congestion.  . [place] WORT PO Take 1 capsule by mouth 2 (two) times daily.  Marland Kitchen tiZANidine (ZANAFLEX) 4 MG tablet Take 0.5-1 tablets (2-4 mg total) by mouth every 8 (eight) hours as needed for muscle spasms.  . zoledronic acid (RECLAST) 5 MG/100ML SOLN injection Inject 5 mg into the  vein once.    FAMILY HISTORY:  Her indicated that her mother is deceased. She indicated that her father is deceased. She indicated that her brother is alive. She indicated that her maternal grandmother is deceased. She indicated that her maternal grandfather is deceased. She indicated that her paternal grandmother is deceased. She indicated that her paternal grandfather is deceased. She indicated that her daughter is deceased. She indicated that both of her sons are deceased.   SOCIAL HISTORY: She  reports that she quit smoking about 24 years ago. Her smoking use included Cigarettes. She has a 135 pack-year smoking history. She has never used smokeless tobacco. She reports that she does not drink alcohol or use illicit drugs.  REVIEW OF SYSTEMS:   Unable   SUBJECTIVE:  Sedated on vent   VITAL SIGNS: BP 107/72 mmHg  Pulse 119  Temp(Src) 97 F (36.1 C) (Core (Comment))  Resp 14  SpO2 94%  HEMODYNAMICS:    VENTILATOR SETTINGS: Vent Mode:  [-] PRVC FiO2 (%):  [60 %-100 %] 60 % Set Rate:  [14 bmp] 14 bmp Vt Set:  [360 mL-500 mL] 360 mL PEEP:  [5 cmH20] 5 cmH20 Plateau Pressure:  [20 cmH20] 20 cmH20  INTAKE / OUTPUT:    PHYSICAL EXAMINATION: General:  81  year old female sedated on vent  Neuro:  Sedated, no focal def.  HEENT:  Orally intubated.  Cardiovascular:  Rrr, no murmur  Lungs:  Crackles diffuse  Abdomen:  Soft, not tender  Musculoskeletal:  Equal  Skin:  Intact   LABS:  BMET  Recent Labs Lab 03/29/2016 0950 03/11/2016 1010  NA 137 141  K 4.1 4.2  CL 110 113*  CO2 17*  --   BUN 33* 35*  CREATININE 1.83* 1.80*  GLUCOSE 264* 249*    Electrolytes  Recent Labs Lab 03/12/2016 0950  CALCIUM 9.0    CBC  Recent Labs Lab 03/20/2016 0950 03/10/2016 1010  WBC 16.6*  --   HGB 9.6* 11.9*  HCT 32.4* 35.0*  PLT 412*  --     Coag's  Recent Labs Lab 04/04/2016 0950  INR 1.18    Sepsis Markers  Recent Labs Lab 03/06/2016 1010  LATICACIDVEN 1.69     ABG  Recent Labs Lab 03/09/2016 1034  PHART 7.099*  PCO2ART 55.8*  PO2ART 308.0*    Liver Enzymes  Recent Labs Lab 03/27/2016 0950  AST 40  ALT 13*  ALKPHOS 74  BILITOT 0.4  ALBUMIN 3.3*    Cardiac Enzymes  Recent Labs Lab 03/18/2016 0950  TROPONINI 0.07*    Glucose  Recent Labs Lab 03/08/2016 0945  GLUCAP 242*    Imaging Dg Chest Portable 1 View  03/11/2016  CLINICAL DATA:  Status post intubation and orogastric tube placement. EXAM: PORTABLE CHEST 1 VIEW COMPARISON:  February 23, 2016. FINDINGS: Stable cardiomegaly. Stable mild central pulmonary vascular congestion is noted. Endotracheal tube tip is 4 cm above the carina and therefore in grossly good position. Nasogastric tube is seen looped within the esophagus with distal tip just above the gastroesophageal junction. Mild bilateral basilar opacities are noted concerning for edema or atelectasis with associated pleural effusions. No pneumothorax is noted. Bony thorax is unremarkable. IMPRESSION: Endotracheal tube in grossly good position. Nasogastric tube is coiled within the esophagus with distal tip just above the gastroesophageal junction. Mild bibasilar opacities are noted concerning for edema or atelectasis with associated pleural effusions. Electronically Signed   By: Marijo Conception, M.D.   On: 03/19/2016 10:30   Dg Abd Portable 1v  03/17/2016  CLINICAL DATA:  Status post intubation and OG tube placement. EXAM: PORTABLE ABDOMEN - 1 VIEW COMPARISON:  None. FINDINGS: OG tube is incompletely imaged at the upper aspects of the exam. It appears to be coiled on itself within the lower esophagus. Paucity of bowel gas within the abdomen and pelvis. No evidence of free intraperitoneal air seen. Extensive calcifications overlying the bilateral iliac bones and hips, presumably chronic soft tissue calcifications. Degenerative changes are seen throughout the scoliotic thoracolumbar spine. Patient is status post bilateral hip  arthroplasties. No acute- appearing osseous abnormality. IMPRESSION: OG tube appears to be coiled on itself within the lower esophagus. Recommend repositioning. Electronically Signed   By: Franki Cabot M.D.   On: 03/29/2016 10:37     STUDIES:  Echo 4/3>>>  CULTURES: BCx2 4/2>>> Respiratory culture 4/2>>>  ANTIBIOTICS: levaquin 4/2>>> azactam 44/2>>> SIGNIFICANT EVENTS:   LINES/TUBES: OETT 4/2>>>  DISCUSSION: Chronic resp failure d/t COPD on 4 liters baseline. Called EMS w/ CP and shortness of breath. Not clear if this is a PNA or possibly NSTEMI. Had marginal trop elevation. On way to ER EMS gave 8mg  morphine and 100% NRB. On arrival to ED unresponsive and hypercarbic. Will obtain sputum culture, ck influenza, add empiric CAP  coverage. Also cycle CEs and cont heparin. Got one time lasix. Will repeat CXR in am to decide on further diuresis   ASSESSMENT / PLAN:  PULMONARY A: Acute hypercarbic respiratory failure in setting of narcotics Bibasilar atx vs infiltrate.  R/o PNA Chest pain  P:   Full vent support Adjust Ve See ID section   CARDIOVASCULAR A:  HTN  Chest pain Possible NSTEMI P:  Gentle hydration Cont tele  Cycle CEs  RENAL A:   AKI Hyperchloremia  Metabolic acidosis/NAG P:   Bicarb gtt Avoid hypotension  Strict I&O F/u chemistry   GASTROINTESTINAL A:   No acute  P:   PPI for SUP Start tube feeds if not extubated on 4/2  HEMATOLOGIC A:   Anemia  P:  Trend h&h Transfuse for hgb<7 [place] heparin   INFECTIOUS A:   Possible CAP P:   Sputum culture  PCT algo  Empiric abx Influenza PCR   ENDOCRINE A:   DM w/ hyperglycemia  P:   ssi protocol   NEUROLOGIC A:   Acute encephalopathy in setting of narcotics  P:   RASS goal: -2 PAD protocol    FAMILY  - Updates: pending   - Inter-disciplinary family meet or Palliative Care meeting due by:  4/9*  Erick Colace ACNP-BC [place] Pager # [phone] OR #  [phone] if no answer   03/06/2016, 12:03 PM  Attending Note:  82 year old female with previous cardiac and COPD history who called for CP.  Was given 8 mg of IV morphine then respiratory failure got worse and patient was intubated.  PCCM called to admit.  Concern for bronchitis as cause vs an AMI resulting in pulmonary edema then respiratory failure.  Will maintain sedated for now, start azactam and levaquin, flu PCR sent, check ABG and adjust vent accordingly.  No family bedside to update.  The patient is critically ill with multiple organ systems failure and requires high complexity decision making for assessment and support, frequent evaluation and titration of therapies, application of advanced monitoring technologies and extensive interpretation of multiple databases.   Critical Care Time devoted to patient care services described in this note is  35  Minutes. This time reflects time of care of this signee Dr Jennet Maduro. This critical care time does not reflect procedure time, or teaching time or supervisory time of PA/NP/Med student/Med Resident etc but could involve care discussion time.  Rush Farmer, M.D. [facility] Pulmonary/Critical Care Medicine. Pager: [phone]. After hours pager: [phone].

## 2016-03-07 NOTE — Procedures (Signed)
Chest Tube Insertion Procedure Note  Indications:  Clinically significant Pneumothorax  Pre-operative Diagnosis: Pneumothorax  Post-operative Diagnosis: Pneumothorax  Procedure Details  Informed consent was obtained for the procedure, including sedation.  Risks of lung perforation, hemorrhage, arrhythmia, and adverse drug reaction were discussed.   After sterile skin prep, using standard technique, a 20 French tube was placed in the right lateral 8th rib space.  Findings: None Sutured at 18 cm Estimated Blood Loss:  Minimal         Specimens:  None              Complications:  None; patient tolerated the procedure well.         Disposition: ICU - intubated and critically ill.         Condition: stable  Erick Colace ACNP-BC [place] Pager # [phone] OR # [phone] if no answer  Rush Farmer, M.D. [facility] Pulmonary/Critical Care Medicine. Pager: [phone]. After hours pager: [phone].

## 2016-03-07 NOTE — Progress Notes (Signed)
[place] Progress Note Patient Name: [name] DOB: 02-Jul-1934 MRN: [id number]   Date of Service  03/28/2016  HPI/Events of Note  Notified by nurse of troponin of 30.  Clinically there has been no change in the patient.  eICU Interventions  Patient seen earlier by cardiology. Cardiology to be notified of trop. Patient on plavix and heparin drip. Will add bblocker     Intervention Category Intermediate Interventions: Other:  Mauri [place], Mamie Nick 03/27/2016, 9:35 PM

## 2016-03-07 NOTE — Progress Notes (Signed)
Pharmacy Antibiotic Note  [name] is a 82 y.o. female admitted on 04/02/2016 with acute respiratory failure.  Pharmacy has been consulted for aztreonam and levaquin dosing for  PNA.  Wt 58.4 kg, pt with multiple drug allergies. WBC 16.1, creat 1.8, creat cl 23 ml/min. AF, intubated. CXR: bibasilar atx vs infiltrates   Plan: aztreonam 2 gm as ordered by MD, then 500 mg q8h levaquin 750 mg IV x 1 dose then 500 q48h F/u renal fxn, WBC, temp, culture data     Temp (24hrs), Avg:97.1 F (36.2 C), Min:96.6 F (35.9 C), Max:97.3 F (36.3 C)   Recent Labs Lab 03/15/2016 0950 03/15/2016 1010  WBC 16.6*  --   CREATININE 1.83* 1.80*  LATICACIDVEN  --  1.69    CrCl cannot be calculated (Unknown ideal weight.).    Allergies  Allergen Reactions  . Keflex [Cephalexin] Itching    "severe itching"  . Nulecit [Na Ferric Gluc Cplx In Sucrose] Diarrhea, Nausea Only and Other (See Comments)      drop in BP  . Biaxin [Clarithromycin] Itching  . Spiriva Handihaler [Tiotropium Bromide Monohydrate] Other (See Comments)    constipation  . Tramadol Itching    Skin burning and itching  . Adhesive [Tape] Itching  . Clarithromycin Itching  . Darvon Itching  . Epinephrine Itching  . Fentanyl Itching  . Morphine And Related Itching  . Oxycodone Itching  . Sulfa Antibiotics Itching  . Vicodin [Hydrocodone-Acetaminophen] Itching    Can take with Benadryl   Thank you for allowing pharmacy to be a part of this patient's care.  Eudelia Bunch, Pharm.D. [id number] 03/19/2016 12:47 PM

## 2016-03-07 NOTE — Progress Notes (Signed)
VT 360 is pt's 8cc's, Titrated FIO2 to 60% based on ABG results.

## 2016-03-07 NOTE — Progress Notes (Signed)
ANTICOAGULATION CONSULT NOTE - Initial Consult  Pharmacy Consult for heparin Indication: ACS/STEMI  Allergies  Allergen Reactions  . Keflex [Cephalexin] Itching    "severe itching"  . Nulecit [Na Ferric Gluc Cplx In Sucrose] Diarrhea, Nausea Only and Other (See Comments)      drop in BP  . Biaxin [Clarithromycin] Itching  . Spiriva Handihaler [Tiotropium Bromide Monohydrate] Other (See Comments)    constipation  . Tramadol Itching    Skin burning and itching  . Adhesive [Tape] Itching  . Clarithromycin Itching  . Darvon Itching  . Epinephrine Itching  . Fentanyl Itching  . Morphine And Related Itching  . Oxycodone Itching  . Sulfa Antibiotics Itching  . Vicodin [Hydrocodone-Acetaminophen] Itching    Can take with Benadryl    Patient Measurements:   Heparin Dosing Weight: 58 kg  Vital Signs: Temp: 97.3 F (36.3 C) (04/02 1045) Temp Source: Core (Comment) (04/02 1031) BP: 116/67 mmHg (04/02 1051) Pulse Rate: 100 (04/02 1051)  Labs:  Recent Labs  03/13/2016 0950 03/23/2016 1010  HGB 9.6* 11.9*  HCT 32.4* 35.0*  PLT 412*  --   LABPROT 15.2  --   INR 1.18  --   CREATININE 1.83* 1.80*  TROPONINI 0.07*  --     CrCl cannot be calculated (Unknown ideal weight.).   Medical History: Past Medical History  Diagnosis Date  . Fibromyalgia   . Skin lesion     chronic calcific cutaneous lesions  . HX of multiple bleeding ulcers 09/01/2011  . Hypertension   . Emphysema   . Dizziness     d/t crystals in ears that "roll around"and can't get them out  . Osteoporosis   . Scoliosis   . Psoriasis   . H/O hiatal hernia   . GERD (gastroesophageal reflux disease)   . Gastric ulcer   . Diverticulosis   . History of colonic polyps   . Shingles   . Vitamin B deficiency   . COPD (chronic obstructive pulmonary disease) ([place])   . On home oxygen therapy     "2L;  24/7" (05/14/2015)  . Left humeral fracture 04/26/2015  . Scoliosis   . Complication of anesthesia     shortness  of breath and pain with gas   . Pneumonia 2015 X 3  . Chronic bronchitis ([place])     "I about keep it"  . History of blood transfusion     "related to my anemia"  . Osteoarthritis   . Arthritis     "qwhere" (05/14/2015)  . Chronic upper back pain   . Kidney stone 25+yrs ago    "passed them"  . Adenocarcinoma of gastroesophageal junction ([place])   . Pernicious anemia   . Iron deficiency anemia   . Anemia, B12 deficiency     "get shots once/month" (05/14/2015)  . Chronic combined systolic (congestive) and diastolic (congestive) heart failure ([place])     a. 2D echo 05/12/15: EF 40-45, diffuse HK, grade 2 DD, moderate AS, mild MR, mod LAE, PASP 26mmHg.  Marland Kitchen Coronary artery disease     a. Severe 3 vessel ASCAD felt to be a poor candidate for CABG given her advanced age, severe COPD, chronic steroid-induced use and poor mobility. Not a good candidate for PCI either.  . Aortic stenosis     a. Mod by echo 05/2015.  Marland Kitchen Hyperlipidemia   . Pulmonary hypertension ([facility])    Assessment: 82 yo F in ED.  She called EMS with respiratory distress.  She was nonverbal  and unresponsive upon arrival. She was intubated in the ED.  She has new left bundle branch block on  EKG.  Pharmacy consulted to dose heparin for ACS/STEMI.   Wt 58.4 kg; Creat 1.8, Hg 11.9, pltc 412, Trop 0.06 and 0.07. No bleeding reported.   Goal of Therapy:  Heparin level 0.3-0.7 units/ml Monitor platelets by anticoagulation protocol: Yes   Plan:  Give 3000 units bolus x 1 Start heparin infusion at 700 units/hr Check anti-Xa level in 8 hours and daily while on heparin Continue to monitor H&H and platelets  Eudelia Bunch, Pharm.D. [id number] 03/09/2016 11:07 AM

## 2016-03-07 NOTE — Progress Notes (Signed)
Pt transported to 2S03 from ED on vent. Vitals remained stable throughout.

## 2016-03-07 NOTE — Procedures (Signed)
Intubation Procedure Note [name] [id number] 05-23-1934  Procedure: Intubation Indications: Respiratory insufficiency  Procedure Details Consent: Risks of procedure as well as the alternatives and risks of each were explained to the (patient/caregiver).  Consent for procedure obtained. Time Out: Verified patient identification, verified procedure, site/side was marked, verified correct patient position, special equipment/implants available, medications/allergies/relevent history reviewed, required imaging and test results available.  Performed  Maximum sterile technique was used including antiseptics, gloves and hand hygiene.  MAC and 3    Evaluation Hemodynamic Status: BP stable throughout; O2 sats: stable throughout Patient's Current Condition: stable Complications: No apparent complications Patient did tolerate procedure well. Chest X-ray ordered to verify placement.  CXR: pending.   [name] 03/23/2016

## 2016-03-07 NOTE — ED Notes (Signed)
Cardiology at bedside.

## 2016-03-07 NOTE — ED Notes (Signed)
Dr. Nelda Marseille just returned page - gave new orders to stop Propofol and meds to correct hypotension. Central line will be started upstairs

## 2016-03-07 NOTE — Progress Notes (Signed)
While RN was drawing labs, pt went to svt 180's. Pt c/o chest pain at that time and bp dropped to 88/74. Suctioned pt via ETT and converted back to SR/ST with minimal chest pain now. Pt having freq PVCs and some beats of VT. Dr. Rosanna Randy notified. Will come to bedside

## 2016-03-07 NOTE — Consult Note (Signed)
Consulting MD: Nelda Marseille Primary Cardiologist: Radford Pax Reason for Consultation: CP abnormal ECG   HPI:  [name] is a 82 y.o. female with a hx of HTN, diastolic HF, GERD, pernicious anemia, prior GI bleed, esophageal adenocarcinoma, severe COPD, remote history of tobacco abuse. She also has severe multivessel CAD including significant ost LM (80%) and high-grade RCA disease admitted through ER with CP and respiratory failure.   Had cath in 6/16   Prox RCA to Mid RCA lesion, 99% stenosed.  Dist RCA lesion, 40% stenosed.  Ost RPDA lesion, 80% stenosed.  Ost LM to LM lesion, 75% stenosed.  Echo demonstrated EF 27-25%, grade 2 diastolic dysfunction, moderate aortic stenosis with mean gradient 20 mmHg, moderately dilated LA and PASP 46 mmHg. She was seen by Dr. Roxan Hockey of cardiothoracic surgery team and it was felt that her risk associated with surgery is prohibitive. After review by interventional cardiology, med Rx was recommended.   PFTs 1/17 FEV1  1.2 L  FVC    2.0L DLCO 41%  Developed acute CP and SOB at home. Called EMS. On arrival patient SOB and uncomfortable. Got morphine 58m. Brought to ER with hypercarbic resp distress. Intubated. Now intubated sedated on vent. ECG with global ST depression and ST elevation in AVR. Trop 0.07.  Review of Systems: obtained through nurse and chart as patient intubated  + CP/SOB/Confusion. No recent fevers or chills.    Past Medical History  Diagnosis Date  . Fibromyalgia   . Skin lesion     chronic calcific cutaneous lesions  . HX of multiple bleeding ulcers 09/01/2011  . Hypertension   . Emphysema   . Dizziness     d/t crystals in ears that "roll around"and can't get them out  . Osteoporosis   . Scoliosis   . Psoriasis   . H/O hiatal hernia   . GERD (gastroesophageal reflux disease)   . Gastric ulcer   . Diverticulosis   . History of colonic polyps   . Shingles   . Vitamin B deficiency   . COPD (chronic  obstructive pulmonary disease) ([facility]   . On home oxygen therapy     "2L;  24/7" (05/14/2015)  . Left humeral fracture 04/26/2015  . Scoliosis   . Complication of anesthesia     shortness of breath and pain with gas   . Pneumonia 2015 X 3  . Chronic bronchitis ([facility]     "I about keep it"  . History of blood transfusion     "related to my anemia"  . Osteoarthritis   . Arthritis     "qwhere" (05/14/2015)  . Chronic upper back pain   . Kidney stone 25+yrs ago    "passed them"  . Adenocarcinoma of gastroesophageal junction ([place]   . Pernicious anemia   . Iron deficiency anemia   . Anemia, B12 deficiency     "get shots once/month" (05/14/2015)  . Chronic combined systolic (congestive) and diastolic (congestive) heart failure ([facility]     a. 2D echo 05/12/15: EF 40-45, diffuse HK, grade 2 DD, moderate AS, mild MR, mod LAE, PASP 480mg.  . Marland Kitchenoronary artery disease     a. Severe 3 vessel ASCAD felt to be a poor candidate for CABG given her advanced age, severe COPD, chronic steroid-induced use and poor mobility. Not a good candidate for PCI either.  . Aortic stenosis     a. Mod by echo 05/2015.  . Marland Kitchenyperlipidemia   . Pulmonary hypertension ([facility]     (  Not in a hospital admission)   . antiseptic oral rinse  7 mL Mouth Rinse QID  . arformoterol  15 mcg Nebulization BID  . aspirin  324 mg Oral NOW   Or  . aspirin  300 mg Rectal NOW  . atorvastatin  80 mg Per Tube q1800  . aztreonam  2 g Intravenous 3 times per day  . budesonide (PULMICORT) nebulizer solution  0.5 mg Nebulization BID  . chlorhexidine gluconate (SAGE KIT)  15 mL Mouth Rinse BID  . clopidogrel  75 mg Per Tube Daily  . cyanocobalamin  1,000 mcg Intramuscular Q30 days  . insulin aspart  0-15 Units Subcutaneous 6 times per day  . ipratropium-albuterol  3 mL Nebulization Q6H  . loratadine  10 mg Per Tube q morning - 10a  . pantoprazole (PROTONIX) IV  40 mg Intravenous Daily  . senna  1 tablet Per Tube QHS    Infusions: . sodium  chloride    . aztreonam    . dextrose 5 % 1,000 mL with sodium bicarbonate 150 mEq infusion    . heparin 700 Units/hr (03/08/2016 1156)  . [START ON 03/09/2016] levofloxacin (LEVAQUIN) IV    . levofloxacin (LEVAQUIN) IV    . propofol (DIPRIVAN) infusion 5 mcg/kg/min (03/30/2016 1108)  . propofol (DIPRIVAN) infusion      Allergies  Allergen Reactions  . Keflex [Cephalexin] Itching    "severe itching"  . Nulecit [Na Ferric Gluc Cplx In Sucrose] Diarrhea, Nausea Only and Other (See Comments)      drop in BP  . Biaxin [Clarithromycin] Itching  . Spiriva Handihaler [Tiotropium Bromide Monohydrate] Other (See Comments)    constipation  . Tramadol Itching    Skin burning and itching  . Adhesive [Tape] Itching  . Clarithromycin Itching  . Darvon Itching  . Epinephrine Itching  . Fentanyl Itching  . Morphine And Related Itching  . Oxycodone Itching  . Sulfa Antibiotics Itching  . Vicodin [Hydrocodone-Acetaminophen] Itching    Can take with Benadryl    Social History   Social History  . Marital Status: Widowed    Spouse Name: N/A  . Number of Children: 5  . Years of Education: 12   Occupational History  . disabled    Social History Main Topics  . Smoking status: Former Smoker -- 3.00 packs/day for 45 years    Types: Cigarettes    Quit date: 12/07/1991  . Smokeless tobacco: Never Used  . Alcohol Use: No  . Drug Use: No  . Sexual Activity: No   Other Topics Concern  . 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 Cancer Maternal Grandmother     gynecologic  . Cancer Maternal Aunt     gynecologic  . Breast cancer Paternal Aunt   . Breast cancer Paternal Aunt   . Breast cancer Paternal Aunt   . Anesthesia problems Neg Hx   . Hypotension Neg Hx   . Malignant hyperthermia Neg Hx   . Pseudochol deficiency Neg Hx   . Heart  attack Neg Hx     PHYSICAL EXAM: Filed Vitals:   03/13/2016 1230 03/19/2016 1248  BP: 101/71 91/66  Pulse: 118 118  Temp: 97 F (36.1 C) 97.2 F (36.2 C)  Resp: 14 14    No intake or output data in the  24 hours ending 03/30/2016 1310  General:  Chronically ill appearing. Intubated sedated HEENT: normal Neck: supple. JVP 8. Carotids 2+ bilat; no bruits. No lymphadenopathy or thryomegaly appreciated. Cor: PMI nondisplaced. Tachy. regular rate & rhythm. 2/6 SEM at RUSB Lungs:  Diminished. No wheeze.  Abdomen: soft, nontender, nondistended. No hepatosplenomegaly. No bruits or masses. Good bowel sounds. Extremities: no cyanosis, clubbing, rash, edema Neuro: alert & oriented x 3, cranial nerves grossly intact. moves all 4 extremities w/o difficulty. Affect pleasant.  ECG: SR 94 global ST depression. ST elevation in aVR  Results for orders placed or performed during the hospital encounter of 03/26/2016 (from the past 24 hour(s))  CBG monitoring, ED     Status: Abnormal   Collection Time: 03/13/2016  9:45 AM  Result Value Ref Range   Glucose-Capillary 242 (H) 65 - 99 mg/dL  CBC with Differential/Platelet     Status: Abnormal   Collection Time: 03/19/2016  9:50 AM  Result Value Ref Range   WBC 16.6 (H) 4.0 - 10.5 K/uL   RBC 3.46 (L) 3.87 - 5.11 MIL/uL   Hemoglobin 9.6 (L) 12.0 - 15.0 g/dL   HCT 32.4 (L) 36.0 - 46.0 %   MCV 93.6 78.0 - 100.0 fL   MCH 27.7 26.0 - 34.0 pg   MCHC 29.6 (L) 30.0 - 36.0 g/dL   RDW 17.1 (H) 11.5 - 15.5 %   Platelets 412 (H) 150 - 400 K/uL   Neutrophils Relative % 66 %   Neutro Abs 10.9 (H) 1.7 - 7.7 K/uL   Lymphocytes Relative 23 %   Lymphs Abs 3.8 0.7 - 4.0 K/uL   Monocytes Relative 9 %   Monocytes Absolute 1.4 (H) 0.1 - 1.0 K/uL   Eosinophils Relative 3 %   Eosinophils Absolute 0.4 0.0 - 0.7 K/uL   Basophils Relative 1 %   Basophils Absolute 0.1 0.0 - 0.1 K/uL  Comprehensive metabolic panel     Status: Abnormal   Collection Time: 03/31/2016  9:50 AM    Result Value Ref Range   Sodium 137 135 - 145 mmol/L   Potassium 4.1 3.5 - 5.1 mmol/L   Chloride 110 101 - 111 mmol/L   CO2 17 (L) 22 - 32 mmol/L   Glucose, Bld 264 (H) 65 - 99 mg/dL   BUN 33 (H) 6 - 20 mg/dL   Creatinine, Ser 1.83 (H) 0.44 - 1.00 mg/dL   Calcium 9.0 8.9 - 10.3 mg/dL   Total Protein 6.9 6.5 - 8.1 g/dL   Albumin 3.3 (L) 3.5 - 5.0 g/dL   AST 40 15 - 41 U/L   ALT 13 (L) 14 - 54 U/L   Alkaline Phosphatase 74 38 - 126 U/L   Total Bilirubin 0.4 0.3 - 1.2 mg/dL   GFR calc non Af Amer 25 (L) >60 mL/min   GFR calc Af Amer 29 (L) >60 mL/min   Anion gap 10 5 - 15  Protime-INR     Status: None   Collection Time: 03/25/2016  9:50 AM  Result Value Ref Range   Prothrombin Time 15.2 11.6 - 15.2 seconds   INR 1.18 0.00 - 1.49  Troponin I     Status: Abnormal   Collection Time: 03/08/2016  9:50 AM  Result Value Ref Range   Troponin I 0.07 (H) <0.031 ng/mL  Brain natriuretic peptide     Status: Abnormal   Collection Time: 04/01/2016  9:50 AM  Result Value Ref Range   B Natriuretic Peptide 2333.7 (H) 0.0 -  100.0 pg/mL  I-stat troponin, ED     Status: None   Collection Time: 03/21/2016 10:08 AM  Result Value Ref Range   Troponin i, poc 0.06 0.00 - 0.08 ng/mL   Comment 3          I-Stat CG4 Lactic Acid, ED     Status: None   Collection Time: 03/29/2016 10:10 AM  Result Value Ref Range   Lactic Acid, Venous 1.69 0.5 - 2.0 mmol/L  I-stat chem 8, ed     Status: Abnormal   Collection Time: 03/26/2016 10:10 AM  Result Value Ref Range   Sodium 141 135 - 145 mmol/L   Potassium 4.2 3.5 - 5.1 mmol/L   Chloride 113 (H) 101 - 111 mmol/L   BUN 35 (H) 6 - 20 mg/dL   Creatinine, Ser 1.80 (H) 0.44 - 1.00 mg/dL   Glucose, Bld 249 (H) 65 - 99 mg/dL   Calcium, Ion 1.30 1.13 - 1.30 mmol/L   TCO2 18 0 - 100 mmol/L   Hemoglobin 11.9 (L) 12.0 - 15.0 g/dL   HCT 35.0 (L) 36.0 - 46.0 %  Urinalysis, Routine w reflex microscopic (not at [facility])     Status: Abnormal   Collection Time: 04/04/2016 10:33 AM   Result Value Ref Range   Color, Urine YELLOW YELLOW   APPearance CLEAR CLEAR   Specific Gravity, Urine 1.021 1.005 - 1.030   pH 5.5 5.0 - 8.0   Glucose, UA NEGATIVE NEGATIVE mg/dL   Hgb urine dipstick NEGATIVE NEGATIVE   Bilirubin Urine NEGATIVE NEGATIVE   Ketones, ur NEGATIVE NEGATIVE mg/dL   Protein, ur 30 (A) NEGATIVE mg/dL   Nitrite NEGATIVE NEGATIVE   Leukocytes, UA NEGATIVE NEGATIVE  Urine microscopic-add on     Status: Abnormal   Collection Time: 04/03/2016 10:33 AM  Result Value Ref Range   Squamous Epithelial / LPF 0-5 (A) NONE SEEN   WBC, UA 0-5 0 - 5 WBC/hpf   RBC / HPF 0-5 0 - 5 RBC/hpf   Bacteria, UA NONE SEEN NONE SEEN  I-Stat Arterial Blood Gas, ED - (order at [facility] and [facility] only)     Status: Abnormal   Collection Time: 04/02/2016 10:34 AM  Result Value Ref Range   pH, Arterial 7.099 (LL) 7.350 - 7.450   pCO2 arterial 55.8 (H) 35.0 - 45.0 mmHg   pO2, Arterial 308.0 (H) 80.0 - 100.0 mmHg   Bicarbonate 17.6 (L) 20.0 - 24.0 mEq/L   TCO2 19 0 - 100 mmol/L   O2 Saturation 100.0 %   Acid-base deficit 12.0 (H) 0.0 - 2.0 mmol/L   Patient temperature 36.0 C    Collection site BRACHIAL ARTERY    Drawn by RT    Sample type ARTERIAL    Comment NOTIFIED PHYSICIAN    Dg Chest Portable 1 View  03/14/2016  CLINICAL DATA:  Status post intubation and orogastric tube placement. EXAM: PORTABLE CHEST 1 VIEW COMPARISON:  February 23, 2016. FINDINGS: Stable cardiomegaly. Stable mild central pulmonary vascular congestion is noted. Endotracheal tube tip is 4 cm above the carina and therefore in grossly good position. Nasogastric tube is seen looped within the esophagus with distal tip just above the gastroesophageal junction. Mild bilateral basilar opacities are noted concerning for edema or atelectasis with associated pleural effusions. No pneumothorax is noted. Bony thorax is unremarkable. IMPRESSION: Endotracheal tube in grossly good position. Nasogastric tube is coiled within the esophagus with  distal tip just above the gastroesophageal junction. Mild bibasilar opacities are noted  concerning for edema or atelectasis with associated pleural effusions. Electronically Signed   By: Marijo Conception, M.D.   On: 03/22/2016 10:30   Dg Abd Portable 1v  03/31/2016  CLINICAL DATA:  Status post intubation and OG tube placement. EXAM: PORTABLE ABDOMEN - 1 VIEW COMPARISON:  None. FINDINGS: OG tube is incompletely imaged at the upper aspects of the exam. It appears to be coiled on itself within the lower esophagus. Paucity of bowel gas within the abdomen and pelvis. No evidence of free intraperitoneal air seen. Extensive calcifications overlying the bilateral iliac bones and hips, presumably chronic soft tissue calcifications. Degenerative changes are seen throughout the scoliotic thoracolumbar spine. Patient is status post bilateral hip arthroplasties. No acute- appearing osseous abnormality. IMPRESSION: OG tube appears to be coiled on itself within the lower esophagus. Recommend repositioning. Electronically Signed   By: Franki Cabot M.D.   On: 03/10/2016 10:37     ASSESSMENT: 1. Acute on chronic respiratory failure with severe COPD 2. Unstable angina 3. Severe 3-v CAD with known 80% LM 4. Intermittent LBBB 5. Mild AS 6. CKD stage IV, baseline Cr 1.8  PLAN/DISCUSSION:  Inciting event unclear but given ECG I worry about global myocardial ischemia - whether this is primary issue or demand ischemia I am not sure. Will proceed with supportive care. BP too low currently for b-blocker on NTG. Will start heparin, ASA. Continue statin. Cycle troponin. Will check echo. Previously turned down for CABG so will need to discuss with interventional team regarding possible LM PCI.   Bensimhon, Daniel,MD 1:26 PM

## 2016-03-07 NOTE — Progress Notes (Signed)
CRITICAL VALUE ALERT  Critical value received:  Troponin 30.66  Date of notification: 03/11/2016  Time of notification:  2126  Critical value read back:Yes.    Nurse who received alert:  Gwendlyn Deutscher RN  Notified Dr. Tamala Julian at [facility] of elevated troponin. Ordered scheduled metoprolol. Also made Dr. Rosanna Randy (cardiology) aware. Ordered EKG and wants pt NPO after midnight for potential heart cath tomorrow.

## 2016-03-07 NOTE — Progress Notes (Signed)
[place] for heparin Indication: ACS/STEMI  Allergies  Allergen Reactions  . Keflex [Cephalexin] Itching    "severe itching"  . Nulecit [Na Ferric Gluc Cplx In Sucrose] Diarrhea, Nausea Only and Other (See Comments)      drop in BP  . Biaxin [Clarithromycin] Itching  . Spiriva Handihaler [Tiotropium Bromide Monohydrate] Other (See Comments)    constipation  . Tramadol Itching    Skin burning and itching  . Adhesive [Tape] Itching  . Clarithromycin Itching  . Darvon Itching  . Epinephrine Itching  . Fentanyl Itching  . Morphine And Related Itching  . Oxycodone Itching  . Sulfa Antibiotics Itching  . Vicodin [Hydrocodone-Acetaminophen] Itching    Can take with Benadryl    Patient Measurements: Height: 5' (152.4 cm) Weight: 129 lb 3 oz (58.6 kg) IBW/kg (Calculated) : 45.5 Heparin Dosing Weight: 58 kg  Vital Signs: Temp: 97 F (36.1 C) (04/02 1900) Temp Source: Core (Comment) (04/02 1600) BP: 123/85 mmHg (04/02 1900) Pulse Rate: 96 (04/02 1900)  Labs:  Recent Labs  03/21/2016 0950 03/06/2016 1010 03/11/2016 1346 03/22/2016 2034  HGB 9.6* 11.9*  --   --   HCT 32.4* 35.0*  --   --   PLT 412*  --   --   --   APTT  --   --  107*  --   LABPROT 15.2  --  16.4*  --   INR 1.18  --  1.31  --   HEPARINUNFRC  --   --   --  0.45  CREATININE 1.83* 1.80*  --   --   TROPONINI 0.07*  --  0.26*  --     Estimated Creatinine Clearance: 20.3 mL/min (by C-G formula based on Cr of 1.8).   Medical History: Past Medical History  Diagnosis Date  . Fibromyalgia   . Skin lesion     chronic calcific cutaneous lesions  . HX of multiple bleeding ulcers 09/01/2011  . Hypertension   . Emphysema   . Dizziness     d/t crystals in ears that "roll around"and can't get them out  . Osteoporosis   . Scoliosis   . Psoriasis   . H/O hiatal hernia   . GERD (gastroesophageal reflux disease)   . Gastric ulcer   . Diverticulosis   . History of colonic  polyps   . Shingles   . Vitamin B deficiency   . COPD (chronic obstructive pulmonary disease) ([place])   . On home oxygen therapy     "2L;  24/7" (05/14/2015)  . Left humeral fracture 04/26/2015  . Scoliosis   . Complication of anesthesia     shortness of breath and pain with gas   . Pneumonia 2015 X 3  . Chronic bronchitis ([place])     "I about keep it"  . History of blood transfusion     "related to my anemia"  . Osteoarthritis   . Arthritis     "qwhere" (05/14/2015)  . Chronic upper back pain   . Kidney stone 25+yrs ago    "passed them"  . Adenocarcinoma of gastroesophageal junction ([place])   . Pernicious anemia   . Iron deficiency anemia   . Anemia, B12 deficiency     "get shots once/month" (05/14/2015)  . Chronic combined systolic (congestive) and diastolic (congestive) heart failure ([place])     a. 2D echo 05/12/15: EF 40-45, diffuse HK, grade 2 DD, moderate AS, mild MR, mod LAE, PASP 8mmHg.  Marland Kitchen Coronary  artery disease     a. Severe 3 vessel ASCAD felt to be a poor candidate for CABG given her advanced age, severe COPD, chronic steroid-induced use and poor mobility. Not a good candidate for PCI either.  . Aortic stenosis     a. Mod by echo 05/2015.  Marland Kitchen Hyperlipidemia   . Pulmonary hypertension (H[facility])    Assessment: 82 yo F in ED.  She called EMS with respiratory distress, was nonverbal and unresponsive upon arrival. Pharmacy consulted to dose heparin for ACS/STEMI.   Hg 11.9, pltc 412.  Initial HL therapeutic (0.45) on 700 units/h. 8h HL to confirm. No bleed reported.   Goal of Therapy:  Heparin level 0.3-0.7 units/ml Monitor platelets by anticoagulation protocol: Yes   Plan:  Heparin at 700 units/h 8h HL to confirm Daily HL/CBC Monitor s/sx bleeding  Elicia Lamp, PharmD, BCPS Clinical Pharmacist Pager [phone] 03/21/2016 9:10 PM

## 2016-03-07 NOTE — Procedures (Signed)
Central Venous Catheter Insertion Procedure Note QUETZALLI LAFFERTY ME:[id number] Apr 14, 1934  Procedure: Insertion of Central Venous Catheter Indications: Assessment of intravascular volume and Drug and/or fluid administration  Procedure Details Consent: Unable to obtain consent because of emergent medical necessity. Time Out: Verified patient identification, verified procedure, site/side was marked, verified correct patient position, special equipment/implants available, medications/allergies/relevent history reviewed, required imaging and test results available.  Performed  Maximum sterile technique was used including antiseptics, cap, gloves, gown, hand hygiene, mask and sheet. Skin prep: Chlorhexidine; local anesthetic administered A antimicrobial bonded/coated triple lumen catheter was placed in the right internal jugular vein using the Seldinger technique.  Evaluation Blood flow good    Complications: Complications of right pneumothorax, addressed w/ # 20 CT  Patient did tolerate procedure well. Chest X-ray ordered to verify placement.  CXR: pending.  Clementeen Graham 03/11/2016, 5:03 PM  Rush Farmer, M.D. [facility] Pulmonary/Critical Care Medicine. Pager: [phone]. After hours pager: [phone].

## 2016-03-08 ENCOUNTER — Other Ambulatory Visit (HOSPITAL_COMMUNITY): Payer: Medicare Other

## 2016-03-08 ENCOUNTER — Inpatient Hospital Stay (HOSPITAL_COMMUNITY): Payer: Medicare Other

## 2016-03-08 DIAGNOSIS — J9621 Acute and chronic respiratory failure with hypoxia: Principal | ICD-10-CM

## 2016-03-08 DIAGNOSIS — N179 Acute kidney failure, unspecified: Secondary | ICD-10-CM

## 2016-03-08 DIAGNOSIS — N189 Chronic kidney disease, unspecified: Secondary | ICD-10-CM

## 2016-03-08 DIAGNOSIS — I5021 Acute systolic (congestive) heart failure: Secondary | ICD-10-CM

## 2016-03-08 DIAGNOSIS — I214 Non-ST elevation (NSTEMI) myocardial infarction: Secondary | ICD-10-CM

## 2016-03-08 DIAGNOSIS — I5043 Acute on chronic combined systolic (congestive) and diastolic (congestive) heart failure: Secondary | ICD-10-CM

## 2016-03-08 LAB — BASIC METABOLIC PANEL
ANION GAP: 11 (ref 5–15)
BUN: 32 mg/dL — ABNORMAL HIGH (ref 6–20)
CO2: 21 mmol/L — AB (ref 22–32)
Calcium: 8.6 mg/dL — ABNORMAL LOW (ref 8.9–10.3)
Chloride: 109 mmol/L (ref 101–111)
Creatinine, Ser: 1.72 mg/dL — ABNORMAL HIGH (ref 0.44–1.00)
GFR calc Af Amer: 31 mL/min — ABNORMAL LOW (ref >60)
GFR calc non Af Amer: 27 mL/min — ABNORMAL LOW (ref >60)
GLUCOSE: 167 mg/dL — AB (ref 65–99)
POTASSIUM: 3.5 mmol/L (ref 3.5–5.1)
Sodium: 141 mmol/L (ref 135–145)

## 2016-03-08 LAB — CBC
HEMATOCRIT: 28.1 % — AB (ref 36.0–46.0)
Hemoglobin: 8.6 g/dL — ABNORMAL LOW (ref 12.0–15.0)
MCH: 27.7 pg (ref 26.0–34.0)
MCHC: 30.6 g/dL (ref 30.0–36.0)
MCV: 90.4 fL (ref 78.0–100.0)
Platelets: 290 10*3/uL (ref 150–400)
RBC: 3.11 MIL/uL — AB (ref 3.87–5.11)
RDW: 17.1 % — AB (ref 11.5–15.5)
WBC: 14.1 10*3/uL — AB (ref 4.0–10.5)

## 2016-03-08 LAB — BLOOD GAS, ARTERIAL
ACID-BASE DEFICIT: 4.1 mmol/L — AB (ref 0.0–2.0)
Bicarbonate: 19.6 mEq/L — ABNORMAL LOW (ref 20.0–24.0)
Drawn by: 419771
FIO2: 0.5
LHR: 30 {breaths}/min
MECHVT: 360 mL
O2 Saturation: 98.1 %
PATIENT TEMPERATURE: 98.6
PCO2 ART: 30.6 mmHg — AB (ref 35.0–45.0)
PEEP/CPAP: 5 cmH2O
PO2 ART: 149 mmHg — AB (ref 80.0–100.0)
TCO2: 20.5 mmol/L (ref 0–100)
pH, Arterial: 7.422 (ref 7.350–7.450)

## 2016-03-08 LAB — GLUCOSE, CAPILLARY
GLUCOSE-CAPILLARY: 144 mg/dL — AB (ref 65–99)
GLUCOSE-CAPILLARY: 154 mg/dL — AB (ref 65–99)
GLUCOSE-CAPILLARY: 140 mg/dL — AB (ref 65–99)
Glucose-Capillary: 195 mg/dL — ABNORMAL HIGH (ref 65–99)
Glucose-Capillary: 125 mg/dL — ABNORMAL HIGH (ref 65–99)
Glucose-Capillary: 100 mg/dL — ABNORMAL HIGH (ref 65–99)

## 2016-03-08 LAB — HEMOGLOBIN A1C
HEMOGLOBIN A1C: 5.7 % — AB (ref 4.8–5.6)
MEAN PLASMA GLUCOSE: 117 mg/dL

## 2016-03-08 LAB — TROPONIN I

## 2016-03-08 LAB — HEPARIN LEVEL (UNFRACTIONATED)
HEPARIN UNFRACTIONATED: 0.21 [IU]/mL — AB (ref 0.30–0.70)
HEPARIN UNFRACTIONATED: 0.58 [IU]/mL (ref 0.30–0.70)
Heparin Unfractionated: 0.64 IU/mL (ref 0.30–0.70)

## 2016-03-08 LAB — PHOSPHORUS: Phosphorus: 3.3 mg/dL (ref 2.5–4.6)

## 2016-03-08 LAB — PROCALCITONIN: Procalcitonin: 0.23 ng/mL

## 2016-03-08 LAB — MAGNESIUM: Magnesium: 2 mg/dL (ref 1.7–2.4)

## 2016-03-08 MED ORDER — HEPARIN BOLUS VIA INFUSION
1500.0000 [IU] | Freq: Once | INTRAVENOUS | Status: AC
  Administered 2016-03-08: 1500 [IU] via INTRAVENOUS
  Filled 2016-03-08: qty 1500

## 2016-03-08 MED ORDER — METOPROLOL TARTRATE 1 MG/ML IV SOLN
5.0000 mg | Freq: Once | INTRAVENOUS | Status: AC
  Administered 2016-03-08: 5 mg via INTRAVENOUS

## 2016-03-08 MED ORDER — AMIODARONE LOAD VIA INFUSION
150.0000 mg | Freq: Once | INTRAVENOUS | Status: AC
  Administered 2016-03-08: 150 mg via INTRAVENOUS

## 2016-03-08 MED ORDER — ONDANSETRON HCL 4 MG/2ML IJ SOLN
4.0000 mg | Freq: Four times a day (QID) | INTRAMUSCULAR | Status: DC | PRN
  Administered 2016-03-08 – 2016-03-10 (×6): 4 mg via INTRAVENOUS
  Filled 2016-03-08 (×6): qty 2

## 2016-03-08 MED ORDER — AMIODARONE HCL IN DEXTROSE 360-4.14 MG/200ML-% IV SOLN
60.0000 mg/h | INTRAVENOUS | Status: AC
  Administered 2016-03-08 (×2): 60 mg/h via INTRAVENOUS
  Filled 2016-03-08: qty 200

## 2016-03-08 MED ORDER — DEXTROSE 5 % IV SOLN
75.0000 mg | Freq: Once | INTRAVENOUS | Status: AC
  Administered 2016-03-08: 75 mg via INTRAVENOUS

## 2016-03-08 MED ORDER — FUROSEMIDE 10 MG/ML IJ SOLN
20.0000 mg | Freq: Two times a day (BID) | INTRAMUSCULAR | Status: DC
  Administered 2016-03-08 – 2016-03-09 (×2): 20 mg via INTRAVENOUS
  Filled 2016-03-08 (×2): qty 2

## 2016-03-08 MED ORDER — AMIODARONE HCL IN DEXTROSE 360-4.14 MG/200ML-% IV SOLN
30.0000 mg/h | INTRAVENOUS | Status: DC
  Administered 2016-03-08 – 2016-03-10 (×5): 30 mg/h via INTRAVENOUS
  Filled 2016-03-08 (×5): qty 200

## 2016-03-08 MED ORDER — AMIODARONE HCL IN DEXTROSE 360-4.14 MG/200ML-% IV SOLN
INTRAVENOUS | Status: AC
  Administered 2016-03-08: 200 mL
  Filled 2016-03-08: qty 200

## 2016-03-08 MED ORDER — ASPIRIN EC 81 MG PO TBEC
81.0000 mg | DELAYED_RELEASE_TABLET | Freq: Every day | ORAL | Status: DC
  Administered 2016-03-08 – 2016-03-15 (×8): 81 mg via ORAL
  Filled 2016-03-08 (×8): qty 1

## 2016-03-08 MED ORDER — AMIODARONE IV BOLUS ONLY 150 MG/100ML: 150.0000 mg | Freq: Once | INTRAVENOUS | Status: DC

## 2016-03-08 NOTE — Progress Notes (Signed)
Pharmacist Heart Failure Core Measure Documentation  Assessment: [name] has an EF documented as 40-45% on 05/12/15 by Echo.  Rationale: Heart failure patients with left ventricular systolic dysfunction (LVSD) and an EF < 40% should be prescribed an angiotensin converting enzyme inhibitor (ACEI) or angiotensin receptor blocker (ARB) at discharge unless a contraindication is documented in the medical record.  This patient is not currently on an ACEI or ARB for HF.  This note is being placed in the record in order to provide documentation that a contraindication to the use of these agents is present for this encounter.  ACE Inhibitor or Angiotensin Receptor Blocker is contraindicated (specify all that apply)  []   ACEI allergy AND ARB allergy []   Angioedema []   Moderate or severe aortic stenosis []   Hyperkalemia []   Hypotension []   Renal artery stenosis [x]   Worsening renal function, preexisting renal disease or dysfunction  Hildred Laser, Pharm D 03/08/2016 1:03 PM

## 2016-03-08 NOTE — Plan of Care (Addendum)
Cardiology Plan of Care  Called regarding troponin elevation from 0.07 on presentation to 30.66.  EKG repeated, improved from prior in terms of  aVR elevation & global ST depression.  The pt also had an episode of SVT that self-resolved but was associated with CP as well as frequent non-sustained VT.  Electrolytes replete.  Given the pain associated with her tachyarrhythmia in combination with her current ischemia, will start Amio as a temporizing agent while reperfusion options are contemplated given recent turn-down for CABG.  In the meantime, she is receiving ASA, Heparin, & P2Y12.  Briefly discussed with the on-call interventional team.  Frann Rider, MD

## 2016-03-08 NOTE — Progress Notes (Signed)
TELEMETRY: Reviewed telemetry pt in NSR with frequent PVCs and runs of NSVT : Filed Vitals:   03/08/16 1100 03/08/16 1127 03/08/16 1200 03/08/16 1249  BP: 114/66 130/82 128/74   Pulse: 78 81 90   Temp: 99.7 F (37.6 C) 99.5 F (37.5 C) 99.7 F (37.6 C)   TempSrc:      Resp: '17 15 18   '$ Height:      Weight:      SpO2: 100% 100% 100% 100%    Intake/Output Summary (Last 24 hours) at 03/08/16 1337 Last data filed at 03/08/16 1200  Gross per 24 hour  Intake 2455.44 ml  Output   1550 ml  Net 905.44 ml   Filed Weights   03/25/2016 1300 03/12/2016 1450 03/08/16 0454  Weight: 58.4 kg (128 lb 12 oz) 58.6 kg (129 lb 3 oz) 59.5 kg (131 lb 2.8 oz)    Subjective Patient sedated on vent. Awakens to stimulus and will respond to questions. Some right sided chest pain. Fights vent when alert.  Marland Kitchen antiseptic oral rinse  7 mL Mouth Rinse QID  . arformoterol  15 mcg Nebulization BID  . aspirin EC  81 mg Oral Daily  . atorvastatin  80 mg Per Tube q1800  . aztreonam  500 mg Intravenous 3 times per day  . budesonide (PULMICORT) nebulizer solution  0.5 mg Nebulization BID  . chlorhexidine gluconate (SAGE KIT)  15 mL Mouth Rinse BID  . clopidogrel  75 mg Per Tube Daily  . insulin aspart  0-15 Units Subcutaneous 6 times per day  . ipratropium-albuterol  3 mL Nebulization Q6H  . [START ON 03/09/2016] levofloxacin (LEVAQUIN) IV  500 mg Intravenous Q48H  . loratadine  10 mg Per Tube q morning - 10a  . metoprolol  5 mg Intravenous 4 times per day  . pantoprazole (PROTONIX) IV  40 mg Intravenous Daily  . senna  1 tablet Per Tube QHS   . amiodarone 30 mg/hr (03/08/16 1200)  . dextrose 5 % 1,000 mL with sodium bicarbonate 150 mEq infusion 75 mL/hr at 03/08/16 1200  . heparin 900 Units/hr (03/08/16 1200)  . phenylephrine (NEO-SYNEPHRINE) Adult infusion    . propofol (DIPRIVAN) infusion Stopped (03/13/2016 1315)  . propofol (DIPRIVAN) infusion      LABS: Basic Metabolic Panel:  Recent Labs   03/26/2016 0950 03/10/2016 1010 03/11/2016 1346 03/08/16 0439  NA 137 141  --  141  K 4.1 4.2  --  3.5  CL 110 113*  --  109  CO2 17*  --   --  21*  GLUCOSE 264* 249*  --  167*  BUN 33* 35*  --  32*  CREATININE 1.83* 1.80*  --  1.72*  CALCIUM 9.0  --   --  8.6*  MG  --   --  2.3 2.0  PHOS  --   --  6.4* 3.3   Liver Function Tests:  Recent Labs  04/01/2016 0950  AST 40  ALT 13*  ALKPHOS 74  BILITOT 0.4  PROT 6.9  ALBUMIN 3.3*   No results for input(s): LIPASE, AMYLASE in the last 72 hours. CBC:  Recent Labs  03/10/2016 0950 03/27/2016 1010 03/08/16 0439  WBC 16.6*  --  14.1*  NEUTROABS 10.9*  --   --   HGB 9.6* 11.9* 8.6*  HCT 32.4* 35.0* 28.1*  MCV 93.6  --  90.4  PLT 412*  --  290   Cardiac Enzymes:  Recent Labs  03/15/2016 1346 03/15/2016  2011 03/19/2016 2348  TROPONINI 0.26* 30.66* >65.00*   BNP: No results for input(s): PROBNP in the last 72 hours. D-Dimer: No results for input(s): DDIMER in the last 72 hours. Hemoglobin A1C:  Recent Labs  03/06/2016 1347  HGBA1C 5.7*   Fasting Lipid Panel:  Recent Labs  03/10/2016 1346  TRIG 93   Thyroid Function Tests: No results for input(s): TSH, T4TOTAL, T3FREE, THYROIDAB in the last 72 hours.  Invalid input(s): FREET3   Radiology/Studies:  Portable Chest Xray  03/08/2016  CLINICAL DATA:  Pneumonia. EXAM: PORTABLE CHEST 1 VIEW COMPARISON:  03/26/2016. FINDINGS: Endotracheal tube, NG tube, right subclavian line, right chest tube in stable position. Heart size normal. Mild bilateral pulmonary infiltrates and bilateral effusions noted. No pneumothorax. Right chest wall subcutaneous emphysema noted. IMPRESSION: 1. Lines and tubes in stable position. Right chest tube in stable position. No pneumothorax. Stable right chest wall subcutaneous emphysema . 2. Bilateral pulmonary infiltrates, right side greater left. Bilateral pleural effusions again noted. No interim change. Electronically Signed   By: Marcello Moores  Register   On:  03/08/2016 07:38   Dg Chest Portable 1 View  03/19/2016  CLINICAL DATA:  Orogastric tube placement EXAM: PORTABLE CHEST 1 VIEW COMPARISON:  03/24/2016 FINDINGS: No change right chest tube, central line, endotracheal tube, or subcutaneous emphysema on the right. No change hazy opacification over both lower lobes. The orogastric tube extends through the esophagus and into the upper abdomen without evidence of coiling. Stable sclerotic vertebral body. IMPRESSION: No evidence of coiling. Electronically Signed   By: Skipper Cliche M.D.   On: 03/26/2016 18:35   Dg Chest Port 1 View  03/31/2016  CLINICAL DATA:  Encounter for central line placement EXAM: PORTABLE CHEST 1 VIEW COMPARISON:  Portable exam at 1700 hrs compared to 1011 hrs FINDINGS: [name] is poorly localized. Tip of endotracheal tube is at the level of the aortic arch, in satisfactory position above expected position of carina. Nasogastric tube coiled in mid esophagus though the tip extends to the distal esophagus above the gastroesophageal junction ; recommend removal and replacement RIGHT thoracostomy tube newly identified. RIGHT subclavian central venous catheter with tip projecting over SVC. Numerous EKG leads project over chest. Orthopedic hardware proximal LEFT humerus. Minimal enlargement of cardiac silhouette. Atherosclerotic calcification aorta. Prominent central pulmonary arteries. Bibasilar effusions and atelectasis. Underlying COPD changes. No pneumothorax or definite segmental consolidation. Bones demineralized. IMPRESSION: No pneumothorax following RIGHT subclavian line and RIGHT thoracostomy tube placements. Nasogastric tube coiled in mid esophagus; recommend withdrawal and replacement. COPD changes with bibasilar effusions and atelectasis. Findings called to Katie RN on [facility] on 03/11/2016 at 1735 hours. Electronically Signed   By: Lavonia Dana M.D.   On: 04/01/2016 17:37   Dg Chest Portable 1 View  03/19/2016  CLINICAL DATA:  Status post  intubation and orogastric tube placement. EXAM: PORTABLE CHEST 1 VIEW COMPARISON:  February 23, 2016. FINDINGS: Stable cardiomegaly. Stable mild central pulmonary vascular congestion is noted. Endotracheal tube tip is 4 cm above the carina and therefore in grossly good position. Nasogastric tube is seen looped within the esophagus with distal tip just above the gastroesophageal junction. Mild bilateral basilar opacities are noted concerning for edema or atelectasis with associated pleural effusions. No pneumothorax is noted. Bony thorax is unremarkable. IMPRESSION: Endotracheal tube in grossly good position. Nasogastric tube is coiled within the esophagus with distal tip just above the gastroesophageal junction. Mild bibasilar opacities are noted concerning for edema or atelectasis with associated pleural effusions. Electronically Signed  By: Marijo Conception, M.D.   On: 03/23/2016 10:30   Dg Abd Portable 1v  03/21/2016  ADDENDUM REPORT: 03/11/2016 17:54 ADDENDUM: A second image at 1718 hours is submitted which shows NG tube with tip projecting over the anticipated position of the stomach. This second image does not image the esophagus and the possibility that the tube is coiled more cranially is not excluded. I talked to the referring provider by telephone at 1754 hours and suggested chest radiography to confirm absence of coiling more cranially. Electronically Signed   By: Skipper Cliche M.D.   On: 04/03/2016 17:54  03/29/2016  CLINICAL DATA:  Orogastric tube placement EXAM: PORTABLE ABDOMEN - 1 VIEW COMPARISON:  03/18/2016 FINDINGS: Nonobstructive gas pattern. Significant spinal scoliosis. Extensive soft tissue calcifications over the abdomen. Orogastric tube is again coiled on itself with in the distal esophagus, but does loop and extending inferiorly to the level of the gastroesophageal junction. IMPRESSION: Orogastric tube should be completely retracted and replaced. It is again coiled upon itself within the  esophagus. Electronically Signed: By: Skipper Cliche M.D. On: 04/01/2016 17:28   Dg Abd Portable 1v  03/30/2016  CLINICAL DATA:  Status post intubation and OG tube placement. EXAM: PORTABLE ABDOMEN - 1 VIEW COMPARISON:  None. FINDINGS: OG tube is incompletely imaged at the upper aspects of the exam. It appears to be coiled on itself within the lower esophagus. Paucity of bowel gas within the abdomen and pelvis. No evidence of free intraperitoneal air seen. Extensive calcifications overlying the bilateral iliac bones and hips, presumably chronic soft tissue calcifications. Degenerative changes are seen throughout the scoliotic thoracolumbar spine. Patient is status post bilateral hip arthroplasties. No acute- appearing osseous abnormality. IMPRESSION: OG tube appears to be coiled on itself within the lower esophagus. Recommend repositioning. Electronically Signed   By: Franki Cabot M.D.   On: 03/29/2016 10:37   Ecg initially showed global ST depression and ST elevation in AVr. Repeat Ecg showed improvement in ST depression.  PHYSICAL EXAM General: Chronically ill, elderly, frail WF intubated. Head: Normocephalic, atraumatic, sclera non-icteric, oropharynx is clear Neck: Negative for carotid bruits. JVD not elevated. No adenopathy Lungs: diffuse crackles.  Heart: RRR with grade 2/6 systolic murmur RUSB. Abdomen: Soft, non-tender, non-distended with normoactive bowel sounds. No hepatomegaly. No rebound/guarding. No obvious abdominal masses. Msk:  Strength and tone appears normal for age. Extremities: No clubbing, cyanosis or edema.  Distal pedal pulses are 2+ and equal bilaterally. Neuro: awakens on vent   ASSESSMENT AND PLAN: 1. Acute on chronic respiratory failure most consistent with acute pulmonary edema in setting of baseline severe COPD. On vent support per CCM. IV diuresis. On empiric antibiotics. I would favor attempting extubation once tuned up with CHF.   2. NSTEMI. Troponin >65. Ecg  findings c/w global ischemia in patient with known severe left main and proximal RCA disease. Seen by Dr. Roxan Hockey in June 2016 following cardiac cath. Not felt to be a surgical candidate due to severe COPD, anemia, and frailty. She has been on good medical therapy. I spoke with son who reports her quality of life has been very poor. On home oxygen and really unable to leave the house. Functional status is very poor. I reviewed films with Dr. Angelena Form. She would be very high risk for stenting. The RCA and left main are heavily calcified and would likely require atherectomy. Doing rotational atherectomy and stenting of an unprotected left main would be very high risk. At this point I do not  feel comfortable recommending this without her express understanding and consent. It may be that a palliative approach would be best given her overall health. Continue IV heparin for now. On ASA and Plavix.  3. Severe COPD.   4. Acute on chronic Combined systolic and diastolic CHF. EF previously 40-45%. Will update Echo. Continue IV diuresis. Wean vent per CCM.   5. NSVT due to ischemia, infarct. Currently on IV amiodarone. Resume beta blocker (bisoprolol) and ARB as tolerated once extubated.   6. Acute on chronic RF   7. Hyperlipidemia on statin.   Present on Admission:  . Respiratory failure ([place])  Signed, Pio Eatherly [place], [place] 03/08/2016 1:37 PM

## 2016-03-08 NOTE — Progress Notes (Signed)
[facility] for heparin Indication: ACS/STEMI  Allergies  Allergen Reactions  . Keflex [Cephalexin] Itching    "severe itching"  . Nulecit [Na Ferric Gluc Cplx In Sucrose] Diarrhea, Nausea Only and Other (See Comments)      drop in BP  . Biaxin [Clarithromycin] Itching  . Spiriva Handihaler [Tiotropium Bromide Monohydrate] Other (See Comments)    constipation  . Tramadol Itching    Skin burning and itching  . Adhesive [Tape] Itching  . Clarithromycin Itching  . Darvon Itching  . Epinephrine Itching  . Fentanyl Itching  . Morphine And Related Itching  . Oxycodone Itching  . Sulfa Antibiotics Itching  . Vicodin [Hydrocodone-Acetaminophen] Itching    Can take with Benadryl    Patient Measurements: Height: 5' (152.4 cm) Weight: 131 lb 2.8 oz (59.5 kg) IBW/kg (Calculated) : 45.5 Heparin Dosing Weight: 58 kg  Vital Signs: Temp: 100.4 F (38 C) (04/03 2000) BP: 109/60 mmHg (04/03 2000) Pulse Rate: 75 (04/03 2000)  Labs:  Recent Labs  03/14/2016 0950 04/04/2016 1010 03/30/2016 1346 03/30/2016 2011  04/02/2016 2348 03/08/16 0439 03/08/16 1225 03/08/16 2010  HGB 9.6* 11.9*  --   --   --   --  8.6*  --   --   HCT 32.4* 35.0*  --   --   --   --  28.1*  --   --   PLT 412*  --   --   --   --   --  290  --   --   APTT  --   --  107*  --   --   --   --   --   --   LABPROT 15.2  --  16.4*  --   --   --   --   --   --   INR 1.18  --  1.31  --   --   --   --   --   --   HEPARINUNFRC  --   --   --   --   < >  --  0.21* 0.64 0.58  CREATININE 1.83* 1.80*  --   --   --   --  1.72*  --   --   TROPONINI 0.07*  --  0.26* 30.66*  --  >65.00*  --   --   --   < > = values in this interval not displayed.  Estimated Creatinine Clearance: 21.4 mL/min (by C-G formula based on Cr of 1.72).  Assessment: 82 y.o. female with severe L main disease and proximal RCA disease on IV heparin, heparin level 0.58, remains therapeutic on 900 units/hr  Goal of Therapy:   Heparin level 0.3-0.7 units/ml Monitor platelets by anticoagulation protocol: Yes   Plan:  -No heparin changes needed -Heparin level and CBC daily  Maryanna Shape, PharmD, BCPS  Clinical Pharmacist  Pager: [phone]   03/08/2016 8:55 PM

## 2016-03-08 NOTE — Progress Notes (Signed)
Pt went into SVT sustaining in the 160s. Notified Dr. Martinique. Orders to give 150 amio bolus and 5mg  of Lopressor. Pt now HR in 70s BP 99/60. Will continue to monitor closely.

## 2016-03-08 NOTE — Progress Notes (Signed)
[employer] for heparin Indication: ACS/STEMI  Allergies  Allergen Reactions  . Keflex [Cephalexin] Itching    "severe itching"  . Nulecit [Na Ferric Gluc Cplx In Sucrose] Diarrhea, Nausea Only and Other (See Comments)      drop in BP  . Biaxin [Clarithromycin] Itching  . Spiriva Handihaler [Tiotropium Bromide Monohydrate] Other (See Comments)    constipation  . Tramadol Itching    Skin burning and itching  . Adhesive [Tape] Itching  . Clarithromycin Itching  . Darvon Itching  . Epinephrine Itching  . Fentanyl Itching  . Morphine And Related Itching  . Oxycodone Itching  . Sulfa Antibiotics Itching  . Vicodin [Hydrocodone-Acetaminophen] Itching    Can take with Benadryl    Patient Measurements: Height: 5' (152.4 cm) Weight: 131 lb 2.8 oz (59.5 kg) IBW/kg (Calculated) : 45.5 Heparin Dosing Weight: 58 kg  Vital Signs: Temp: 100.2 F (37.9 C) (04/03 1300) Temp Source: Core (Comment) (04/03 0400) BP: 101/64 mmHg (04/03 1300) Pulse Rate: 75 (04/03 1300)  Labs:  Recent Labs  03/23/2016 0950 03/22/2016 1010 04/01/2016 1346 03/19/2016 2011 03/10/2016 2034 04/02/2016 2348 03/08/16 0439 03/08/16 1225  HGB 9.6* 11.9*  --   --   --   --  8.6*  --   HCT 32.4* 35.0*  --   --   --   --  28.1*  --   PLT 412*  --   --   --   --   --  290  --   APTT  --   --  107*  --   --   --   --   --   LABPROT 15.2  --  16.4*  --   --   --   --   --   INR 1.18  --  1.31  --   --   --   --   --   HEPARINUNFRC  --   --   --   --  0.45  --  0.21* 0.64  CREATININE 1.83* 1.80*  --   --   --   --  1.72*  --   TROPONINI 0.07*  --  0.26* 30.66*  --  >65.00*  --   --     Estimated Creatinine Clearance: 21.4 mL/min (by C-G formula based on Cr of 1.72).  Assessment: 82 y.o. female with NSTEMI on heparin and now at goal (HL= 0.64 after bolus and infusion increase). Patient with severe L main disease and proximal RCA disease and plans noted for possible palliative  approach.  Goal of Therapy:  Heparin level 0.3-0.7 units/ml Monitor platelets by anticoagulation protocol: Yes   Plan:  -No heparin changes needed -Will recheck a heparin level in 8 hrs -Heparin level and CBC daily  Hildred Laser, Pharm D 03/08/2016 2:43 PM

## 2016-03-08 NOTE — Progress Notes (Addendum)
PULMONARY / CRITICAL CARE MEDICINE   Name: [name] MRN: [id number] DOB: 09/10/1934    ADMISSION DATE:  03/28/2016 CONSULTATION DATE:  4/2  REFERRING MD:  Rancour   CHIEF COMPLAINT:  Acute respiratory failure   HISTORY OF PRESENT ILLNESS:   This is a 82 year old pt w/ chronic respiratory failure f/b Wert for COPD. Also w hx significant CAD and ischemic CM. Called EMS w/ CC weakness, shortness of breath and CP. Sats were 88%. PT given 8 mg morphine and 100% NRB. She was obtunded by time she arrived in ER she was unresponsive and presumed hypercarbic. She was therefore intubated and PCCM asked to admit.   SUBJECTIVE / Interval events:  Sedated on vent   VITAL SIGNS: BP 97/64 mmHg  Pulse 86  Temp(Src) 100 F (37.8 C) (Core (Comment))  Resp 30  Ht 5' (1.524 m)  Wt 59.5 kg (131 lb 2.8 oz)  BMI 25.62 kg/m2  SpO2 100%  HEMODYNAMICS:    VENTILATOR SETTINGS: Vent Mode:  [-] PSV;CPAP FiO2 (%):  [40 %-100 %] 40 % Set Rate:  [14 bmp-30 bmp] 30 bmp Vt Set:  [360 mL-500 mL] 360 mL PEEP:  [5 cmH20] 5 cmH20 Pressure Support:  [5 cmH20] 5 cmH20 Plateau Pressure:  [17 cmH20-20 cmH20] 20 cmH20  INTAKE / OUTPUT: I/O last 3 completed shifts: In: 1745.4 [I.V.:1405.4; NG/GT:90; IV Piggyback:250] Out: 72 [Urine:980; Emesis/NG output:100; Chest Tube:300]  PHYSICAL EXAMINATION: General:  82 year old female sedated on vent  Neuro:  Awake, nods to questions, moves ext HEENT:  Orally intubated.  Cardiovascular:  Rrr, no murmur  Lungs:  Crackles diffuse  Abdomen:  Soft, not tender  Musculoskeletal:  Equal  Skin:  Intact   LABS:  BMET  Recent Labs Lab 04/01/2016 0950 03/19/2016 1010 03/08/16 0439  NA 137 141 141  K 4.1 4.2 3.5  CL 110 113* 109  CO2 17*  --  21*  BUN 33* 35* 32*  CREATININE 1.83* 1.80* 1.72*  GLUCOSE 264* 249* 167*    Electrolytes  Recent Labs Lab 03/23/2016 0950 03/06/2016 1346 03/08/16 0439  CALCIUM 9.0  --  8.6*  MG  --  2.3 2.0  PHOS  --  6.4*  3.3    CBC  Recent Labs Lab 03/06/2016 0950 03/26/2016 1010 03/08/16 0439  WBC 16.6*  --  14.1*  HGB 9.6* 11.9* 8.6*  HCT 32.4* 35.0* 28.1*  PLT 412*  --  290    Coag's  Recent Labs Lab 03/13/2016 0950 03/10/2016 1346  APTT  --  107*  INR 1.18 1.31    Sepsis Markers  Recent Labs Lab 03/20/2016 1010 03/19/2016 1346 03/29/2016 1348 03/08/16 0439  LATICACIDVEN 1.69  --  2.05*  --   PROCALCITON  --  <0.10  --  0.23    ABG  Recent Labs Lab 03/06/2016 1326 03/13/2016 1408 03/08/16 0325  PHART 7.160* 7.108* 7.422  PCO2ART 40.3 52.5* 30.6*  PO2ART 50.0* 87.0 149*    Liver Enzymes  Recent Labs Lab 04/01/2016 0950  AST 40  ALT 13*  ALKPHOS 74  BILITOT 0.4  ALBUMIN 3.3*    Cardiac Enzymes  Recent Labs Lab 03/06/2016 1346 03/15/2016 2011 03/15/2016 2348  TROPONINI 0.26* 30.66* >65.00*    Glucose  Recent Labs Lab 03/19/2016 0945 03/17/2016 1609 03/11/2016 1923 03/31/2016 2323 03/08/16 0323  GLUCAP 242* 148* 157* 144* 154*    Imaging Portable Chest Xray  03/08/2016  CLINICAL DATA:  Pneumonia. EXAM: PORTABLE CHEST 1 VIEW COMPARISON:  04/01/2016. FINDINGS: Endotracheal tube, NG tube, right subclavian line, right chest tube in stable position. Heart size normal. Mild bilateral pulmonary infiltrates and bilateral effusions noted. No pneumothorax. Right chest wall subcutaneous emphysema noted. IMPRESSION: 1. Lines and tubes in stable position. Right chest tube in stable position. No pneumothorax. Stable right chest wall subcutaneous emphysema . 2. Bilateral pulmonary infiltrates, right side greater left. Bilateral pleural effusions again noted. No interim change. Electronically Signed   By: Marcello Moores  Register   On: 03/08/2016 07:38   Dg Chest Portable 1 View  03/18/2016  CLINICAL DATA:  Orogastric tube placement EXAM: PORTABLE CHEST 1 VIEW COMPARISON:  03/23/2016 FINDINGS: No change right chest tube, central line, endotracheal tube, or subcutaneous emphysema on the right. No change  hazy opacification over both lower lobes. The orogastric tube extends through the esophagus and into the upper abdomen without evidence of coiling. Stable sclerotic vertebral body. IMPRESSION: No evidence of coiling. Electronically Signed   By: Skipper Cliche M.D.   On: 03/27/2016 18:35   Dg Chest Port 1 View  03/31/2016  CLINICAL DATA:  Encounter for central line placement EXAM: PORTABLE CHEST 1 VIEW COMPARISON:  Portable exam at 1700 hrs compared to 1011 hrs FINDINGS: [name] is poorly localized. Tip of endotracheal tube is at the level of the aortic arch, in satisfactory position above expected position of carina. Nasogastric tube coiled in mid esophagus though the tip extends to the distal esophagus above the gastroesophageal junction ; recommend removal and replacement RIGHT thoracostomy tube newly identified. RIGHT subclavian central venous catheter with tip projecting over SVC. Numerous EKG leads project over chest. Orthopedic hardware proximal LEFT humerus. Minimal enlargement of cardiac silhouette. Atherosclerotic calcification aorta. Prominent central pulmonary arteries. Bibasilar effusions and atelectasis. Underlying COPD changes. No pneumothorax or definite segmental consolidation. Bones demineralized. IMPRESSION: No pneumothorax following RIGHT subclavian line and RIGHT thoracostomy tube placements. Nasogastric tube coiled in mid esophagus; recommend withdrawal and replacement. COPD changes with bibasilar effusions and atelectasis. Findings called to Katie RN on [facility] on 03/06/2016 at 1735 hours. Electronically Signed   By: Lavonia Dana M.D.   On: 03/30/2016 17:37   Dg Chest Portable 1 View  03/06/2016  CLINICAL DATA:  Status post intubation and orogastric tube placement. EXAM: PORTABLE CHEST 1 VIEW COMPARISON:  February 23, 2016. FINDINGS: Stable cardiomegaly. Stable mild central pulmonary vascular congestion is noted. Endotracheal tube tip is 4 cm above the carina and therefore in grossly good  position. Nasogastric tube is seen looped within the esophagus with distal tip just above the gastroesophageal junction. Mild bilateral basilar opacities are noted concerning for edema or atelectasis with associated pleural effusions. No pneumothorax is noted. Bony thorax is unremarkable. IMPRESSION: Endotracheal tube in grossly good position. Nasogastric tube is coiled within the esophagus with distal tip just above the gastroesophageal junction. Mild bibasilar opacities are noted concerning for edema or atelectasis with associated pleural effusions. Electronically Signed   By: Marijo Conception, M.D.   On: 03/27/2016 10:30   Dg Abd Portable 1v  03/23/2016  ADDENDUM REPORT: 03/19/2016 17:54 ADDENDUM: A second image at 1718 hours is submitted which shows NG tube with tip projecting over the anticipated position of the stomach. This second image does not image the esophagus and the possibility that the tube is coiled more cranially is not excluded. I talked to the referring provider by telephone at 1754 hours and suggested chest radiography to confirm absence of coiling more cranially. Electronically Signed   By: Elodia Florence.D.  On: 03/30/2016 17:54  03/15/2016  CLINICAL DATA:  Orogastric tube placement EXAM: PORTABLE ABDOMEN - 1 VIEW COMPARISON:  03/27/2016 FINDINGS: Nonobstructive gas pattern. Significant spinal scoliosis. Extensive soft tissue calcifications over the abdomen. Orogastric tube is again coiled on itself with in the distal esophagus, but does loop and extending inferiorly to the level of the gastroesophageal junction. IMPRESSION: Orogastric tube should be completely retracted and replaced. It is again coiled upon itself within the esophagus. Electronically Signed: By: Skipper Cliche M.D. On: 03/22/2016 17:28   Dg Abd Portable 1v  03/29/2016  CLINICAL DATA:  Status post intubation and OG tube placement. EXAM: PORTABLE ABDOMEN - 1 VIEW COMPARISON:  None. FINDINGS: OG tube is incompletely  imaged at the upper aspects of the exam. It appears to be coiled on itself within the lower esophagus. Paucity of bowel gas within the abdomen and pelvis. No evidence of free intraperitoneal air seen. Extensive calcifications overlying the bilateral iliac bones and hips, presumably chronic soft tissue calcifications. Degenerative changes are seen throughout the scoliotic thoracolumbar spine. Patient is status post bilateral hip arthroplasties. No acute- appearing osseous abnormality. IMPRESSION: OG tube appears to be coiled on itself within the lower esophagus. Recommend repositioning. Electronically Signed   By: Franki Cabot M.D.   On: 03/28/2016 10:37     STUDIES:  Echo 4/3>>>  CULTURES: BCx2 4/2>>> Respiratory culture 4/2>>> GNR >>   ANTIBIOTICS: levaquin 4/2>>> azactam 4/2>>>  SIGNIFICANT EVENTS:   LINES/TUBES: OETT 4/2>>>  DISCUSSION: Chronic resp failure d/t COPD and CAD / ischemic CM on 4 liters baseline.  Admitted with CP and acute resp failure, NSTEMI, B infiltrates consistent w acute CHF. Intubated and treated medically for her MI + empiric abx in case of possible PNA  ASSESSMENT / PLAN:  PULMONARY A: Acute on chronic respiratory failure in setting of B infiltrates, MSO4  Bibasilar atx vs infiltrates + effusions R > L.  R/o PNA Chest pain  R iatrogenic PTX P:   Full vent support Assess for possible SBT once underlying issues stabilized.  See ID and CV sections R chest tube to -20cm  CARDIOVASCULAR A:  HTN  Severe CAD including L main disease.  NSTEMI SVT 4/2 pm Shock  P:  Heparin + plavix + ASA Schedule metoprolol Amiodarone added 4/2 Phenylephrine gtt if needed (not currently requiring)  Appreciate Cardiology input, note that she was not a CABG candidate when assessed following L cath 6/16. ? Whether PTCI to her L main disease is an option here.   RENAL A:   AKI Hyperchloremia  Metabolic acidosis/NAG P:   D/c'd Bicarb gtt Strict I&O F/u  chemistry   GASTROINTESTINAL A:   No acute  P:   PPI for SUP Start tube feeds soon, timing depending on plans for any procedures  HEMATOLOGIC A:   Anemia  P:  Trend h&h Transfuse for hgb<7 Vivian heparin   INFECTIOUS A:   Possible CAP, suspect acute CHF + effusions. Note Pct initially negative > 0.23 but sputum GS w GNR's.  - Pneumococcal Ag negative - Flu A&B negative P:   Sputum culture >>  Empiric levaquin + aztreonam  ENDOCRINE A:   DM w/ hyperglycemia  P:   ssi protocol   NEUROLOGIC A:   Acute encephalopathy Sedation for MV P:   RASS goal: -2 PAD protocol    FAMILY  - Updates: pending   - Inter-disciplinary family meet or Palliative Care meeting due by:  4/9  Independent CC time 40 minutes  Herbie [place]  Lamonte Sakai, MD, PhD 03/08/2016, 9:13 AM [facility] [phone] or if no answer [phone]

## 2016-03-08 NOTE — Progress Notes (Signed)
[place] for heparin Indication: ACS/STEMI  Allergies  Allergen Reactions  . Keflex [Cephalexin] Itching    "severe itching"  . Nulecit [Na Ferric Gluc Cplx In Sucrose] Diarrhea, Nausea Only and Other (See Comments)      drop in BP  . Biaxin [Clarithromycin] Itching  . Spiriva Handihaler [Tiotropium Bromide Monohydrate] Other (See Comments)    constipation  . Tramadol Itching    Skin burning and itching  . Adhesive [Tape] Itching  . Clarithromycin Itching  . Darvon Itching  . Epinephrine Itching  . Fentanyl Itching  . Morphine And Related Itching  . Oxycodone Itching  . Sulfa Antibiotics Itching  . Vicodin [Hydrocodone-Acetaminophen] Itching    Can take with Benadryl    Patient Measurements: Height: 5' (152.4 cm) Weight: 131 lb 2.8 oz (59.5 kg) IBW/kg (Calculated) : 45.5 Heparin Dosing Weight: 58 kg  Vital Signs: Temp: 100 F (37.8 C) (04/03 0400) Temp Source: Core (Comment) (04/03 0400) BP: 126/83 mmHg (04/03 0400) Pulse Rate: 94 (04/03 0400)  Labs:  Recent Labs  03/09/2016 0950 03/12/2016 1010 03/21/2016 1346 03/20/2016 2011 03/17/2016 2034 03/30/2016 2348 03/08/16 0439  HGB 9.6* 11.9*  --   --   --   --   --   HCT 32.4* 35.0*  --   --   --   --   --   PLT 412*  --   --   --   --   --   --   APTT  --   --  107*  --   --   --   --   LABPROT 15.2  --  16.4*  --   --   --   --   INR 1.18  --  1.31  --   --   --   --   HEPARINUNFRC  --   --   --   --  0.45  --  0.21*  CREATININE 1.83* 1.80*  --   --   --   --   --   TROPONINI 0.07*  --  0.26* 30.66*  --  >65.00*  --     Estimated Creatinine Clearance: 20.4 mL/min (by C-G formula based on Cr of 1.8).  Assessment: 82 y.o. female with NSTEMI for heparin   Goal of Therapy:  Heparin level 0.3-0.7 units/ml Monitor platelets by anticoagulation protocol: Yes   Plan:  Heparin 1500 units IV bolus, then increase heparin 900 units/hr Check heparin level in 8 hours.   Phillis Knack,  PharmD, BCPS  03/08/2016 5:01 AM

## 2016-03-09 ENCOUNTER — Inpatient Hospital Stay (HOSPITAL_COMMUNITY): Payer: Medicare Other

## 2016-03-09 DIAGNOSIS — I472 Ventricular tachycardia: Secondary | ICD-10-CM

## 2016-03-09 DIAGNOSIS — I214 Non-ST elevation (NSTEMI) myocardial infarction: Secondary | ICD-10-CM

## 2016-03-09 DIAGNOSIS — J9621 Acute and chronic respiratory failure with hypoxia: Secondary | ICD-10-CM

## 2016-03-09 DIAGNOSIS — I471 Supraventricular tachycardia: Secondary | ICD-10-CM

## 2016-03-09 DIAGNOSIS — R079 Chest pain, unspecified: Secondary | ICD-10-CM

## 2016-03-09 LAB — CBC
HCT: 28 % — ABNORMAL LOW (ref 36.0–46.0)
Hemoglobin: 8.2 g/dL — ABNORMAL LOW (ref 12.0–15.0)
MCH: 25.9 pg — AB (ref 26.0–34.0)
MCHC: 29.3 g/dL — AB (ref 30.0–36.0)
MCV: 88.6 fL (ref 78.0–100.0)
PLATELETS: 312 10*3/uL (ref 150–400)
RBC: 3.16 MIL/uL — ABNORMAL LOW (ref 3.87–5.11)
RDW: 17.2 % — AB (ref 11.5–15.5)
WBC: 17.9 10*3/uL — ABNORMAL HIGH (ref 4.0–10.5)

## 2016-03-09 LAB — GLUCOSE, CAPILLARY
GLUCOSE-CAPILLARY: 106 mg/dL — AB (ref 65–99)
GLUCOSE-CAPILLARY: 113 mg/dL — AB (ref 65–99)
GLUCOSE-CAPILLARY: 88 mg/dL (ref 65–99)
Glucose-Capillary: 101 mg/dL — ABNORMAL HIGH (ref 65–99)
Glucose-Capillary: 116 mg/dL — ABNORMAL HIGH (ref 65–99)
Glucose-Capillary: 83 mg/dL (ref 65–99)
Glucose-Capillary: 99 mg/dL (ref 65–99)

## 2016-03-09 LAB — BASIC METABOLIC PANEL
Anion gap: 12 (ref 5–15)
Anion gap: 11 (ref 5–15)
BUN: 21 mg/dL — ABNORMAL HIGH (ref 6–20)
BUN: 20 mg/dL (ref 6–20)
CALCIUM: 8.4 mg/dL — AB (ref 8.9–10.3)
CALCIUM: 8.2 mg/dL — AB (ref 8.9–10.3)
CO2: 30 mmol/L (ref 22–32)
CO2: 29 mmol/L (ref 22–32)
CREATININE: 1.39 mg/dL — AB (ref 0.44–1.00)
CREATININE: 1.35 mg/dL — AB (ref 0.44–1.00)
Chloride: 98 mmol/L — ABNORMAL LOW (ref 101–111)
Chloride: 99 mmol/L — ABNORMAL LOW (ref 101–111)
GFR calc Af Amer: 41 mL/min — ABNORMAL LOW (ref >60)
GFR calc non Af Amer: 35 mL/min — ABNORMAL LOW (ref >60)
GFR calc non Af Amer: 36 mL/min — ABNORMAL LOW (ref >60)
GFR, EST AFRICAN AMERICAN: 42 mL/min — AB (ref >60)
GLUCOSE: 110 mg/dL — AB (ref 65–99)
Glucose, Bld: 111 mg/dL — ABNORMAL HIGH (ref 65–99)
Potassium: 2.7 mmol/L — CL (ref 3.5–5.1)
Potassium: 4.1 mmol/L (ref 3.5–5.1)
Sodium: 140 mmol/L (ref 135–145)
Sodium: 139 mmol/L (ref 135–145)

## 2016-03-09 LAB — POCT I-STAT 3, ART BLOOD GAS (G3+)
Acid-Base Excess: 9 mmol/L — ABNORMAL HIGH (ref 0.0–2.0)
Bicarbonate: 31.2 mEq/L — ABNORMAL HIGH (ref 20.0–24.0)
O2 SAT: 96 %
PCO2 ART: 35.8 mmHg (ref 35.0–45.0)
PH ART: 7.552 — AB (ref 7.350–7.450)
PO2 ART: 74 mmHg — AB (ref 80.0–100.0)
Patient temperature: 100.6
TCO2: 32 mmol/L (ref 0–100)

## 2016-03-09 LAB — HEPARIN LEVEL (UNFRACTIONATED): Heparin Unfractionated: 0.43 IU/mL (ref 0.30–0.70)

## 2016-03-09 LAB — LEGIONELLA PNEUMOPHILA SEROGP 1 UR AG: L. pneumophila Serogp 1 Ur Ag: NEGATIVE

## 2016-03-09 LAB — PROCALCITONIN: PROCALCITONIN: 0.15 ng/mL

## 2016-03-09 LAB — ECHOCARDIOGRAM COMPLETE
HEIGHTINCHES: 60 in
WEIGHTICAEL: 2095.25 [oz_av]

## 2016-03-09 LAB — MAGNESIUM: Magnesium: 1.8 mg/dL (ref 1.7–2.4)

## 2016-03-09 MED ORDER — POTASSIUM CHLORIDE 20 MEQ/15ML (10%) PO SOLN
40.0000 meq | ORAL | Status: AC
  Administered 2016-03-09 (×2): 40 meq
  Filled 2016-03-09 (×2): qty 30

## 2016-03-09 MED ORDER — POTASSIUM CHLORIDE 10 MEQ/50ML IV SOLN
10.0000 meq | INTRAVENOUS | Status: AC
  Administered 2016-03-09 (×4): 10 meq via INTRAVENOUS
  Filled 2016-03-09 (×3): qty 50

## 2016-03-09 MED ORDER — KETOROLAC TROMETHAMINE 15 MG/ML IJ SOLN
15.0000 mg | Freq: Once | INTRAMUSCULAR | Status: AC
  Administered 2016-03-09: 15 mg via INTRAVENOUS
  Filled 2016-03-09: qty 1

## 2016-03-09 MED ORDER — FUROSEMIDE 10 MG/ML IJ SOLN
40.0000 mg | Freq: Two times a day (BID) | INTRAMUSCULAR | Status: DC
  Administered 2016-03-09 – 2016-03-10 (×2): 40 mg via INTRAVENOUS
  Filled 2016-03-09 (×2): qty 4

## 2016-03-09 MED ORDER — POTASSIUM CHLORIDE 10 MEQ/50ML IV SOLN
INTRAVENOUS | Status: AC
  Administered 2016-03-09: 10 meq via INTRAVENOUS
  Filled 2016-03-09: qty 50

## 2016-03-09 MED ORDER — LORAZEPAM 0.5 MG PO TABS
0.2500 mg | ORAL_TABLET | Freq: Once | ORAL | Status: AC
  Administered 2016-03-09: 0.25 mg via ORAL
  Filled 2016-03-09: qty 1

## 2016-03-09 NOTE — Progress Notes (Signed)
RT note-placed to t-bar per Dr. Lamonte Sakai. Patient is tolerating this well.

## 2016-03-09 NOTE — Progress Notes (Signed)
TELEMETRY: Reviewed telemetry pt in NSR with occ PVCs no VT or SVT since yesterday afternoon.  Filed Vitals:   03/09/16 0400 03/09/16 0500 03/09/16 0600 03/09/16 0700  BP: 118/71 112/60 94/62 102/66  Pulse:  76 75 74  Temp: 100.2 F (37.9 C) 99.9 F (37.7 C) 100 F (37.8 C) 100.2 F (37.9 C)  TempSrc:      Resp: '18 18 16 17  '$ Height:      Weight:  59.4 kg (130 lb 15.3 oz)    SpO2:  99% 99% 100%    Intake/Output Summary (Last 24 hours) at 03/09/16 0808 Last data filed at 03/09/16 0654  Gross per 24 hour  Intake 2304.8 ml  Output   2025 ml  Net  279.8 ml   Filed Weights   03/13/2016 1450 03/08/16 0454 03/09/16 0500  Weight: 58.6 kg (129 lb 3 oz) 59.5 kg (131 lb 2.8 oz) 59.4 kg (130 lb 15.3 oz)    Subjective Patient awake on vent.  Some right sided chest pain. Expresses desire to get off vent.  Marland Kitchen antiseptic oral rinse  7 mL Mouth Rinse QID  . arformoterol  15 mcg Nebulization BID  . aspirin EC  81 mg Oral Daily  . atorvastatin  80 mg Per Tube q1800  . aztreonam  500 mg Intravenous 3 times per day  . budesonide (PULMICORT) nebulizer solution  0.5 mg Nebulization BID  . chlorhexidine gluconate (SAGE KIT)  15 mL Mouth Rinse BID  . clopidogrel  75 mg Per Tube Daily  . furosemide  20 mg Intravenous Q12H  . insulin aspart  0-15 Units Subcutaneous 6 times per day  . ipratropium-albuterol  3 mL Nebulization Q6H  . levofloxacin (LEVAQUIN) IV  500 mg Intravenous Q48H  . loratadine  10 mg Per Tube q morning - 10a  . metoprolol  5 mg Intravenous 4 times per day  . pantoprazole (PROTONIX) IV  40 mg Intravenous Daily  . potassium chloride  10 mEq Intravenous Q1 Hr x 4  . potassium chloride  40 mEq Per Tube Q4H  . senna  1 tablet Per Tube QHS   . amiodarone 30 mg/hr (03/09/16 0751)  . heparin 900 Units/hr (03/08/16 2100)  . phenylephrine (NEO-SYNEPHRINE) Adult infusion    . propofol (DIPRIVAN) infusion Stopped (03/13/2016 1315)  . propofol (DIPRIVAN) infusion      LABS: Basic  Metabolic Panel:  Recent Labs  03/28/2016 1346 03/08/16 0439 03/09/16 0358  NA  --  141 140  K  --  3.5 2.7*  CL  --  109 98*  CO2  --  21* 30  GLUCOSE  --  167* 110*  BUN  --  32* 21*  CREATININE  --  1.72* 1.39*  CALCIUM  --  8.6* 8.4*  MG 2.3 2.0 1.8  PHOS 6.4* 3.3  --    Liver Function Tests:  Recent Labs  03/09/2016 0950  AST 40  ALT 13*  ALKPHOS 74  BILITOT 0.4  PROT 6.9  ALBUMIN 3.3*   No results for input(s): LIPASE, AMYLASE in the last 72 hours. CBC:  Recent Labs  03/31/2016 0950  03/08/16 0439 03/09/16 0358  WBC 16.6*  --  14.1* 17.9*  NEUTROABS 10.9*  --   --   --   HGB 9.6*  < > 8.6* 8.2*  HCT 32.4*  < > 28.1* 28.0*  MCV 93.6  --  90.4 88.6  PLT 412*  --  290 312  < > =  values in this interval not displayed. Cardiac Enzymes:  Recent Labs  03/11/2016 1346 03/28/2016 2011 03/13/2016 2348  TROPONINI 0.26* 30.66* >65.00*   BNP: No results for input(s): PROBNP in the last 72 hours. D-Dimer: No results for input(s): DDIMER in the last 72 hours. Hemoglobin A1C:  Recent Labs  04/02/2016 1347  HGBA1C 5.7*   Fasting Lipid Panel:  Recent Labs  03/06/2016 1346  TRIG 93   Thyroid Function Tests: No results for input(s): TSH, T4TOTAL, T3FREE, THYROIDAB in the last 72 hours.  Invalid input(s): FREET3   Radiology/Studies:  Dg Chest Port 1 View  03/09/2016  CLINICAL DATA:  Respiratory failure EXAM: PORTABLE CHEST 1 VIEW COMPARISON:  03/08/2016 FINDINGS: Cardiac shadow is mildly enlarged but stable. An endotracheal tube and nasogastric catheter are again seen in stable position. Right-sided subclavian line and right thoracostomy catheter are noted in satisfactory position. Bilateral pleural effusions are again seen. No new focal abnormality is noted. Postsurgical changes in the left humerus are seen. IMPRESSION: No significant change from the prior exam. No pneumothorax is noted. Electronically Signed   By: Inez Catalina M.D.   On: 03/09/2016 08:05   Portable  Chest Xray  03/08/2016  CLINICAL DATA:  Pneumonia. EXAM: PORTABLE CHEST 1 VIEW COMPARISON:  04/02/2016. FINDINGS: Endotracheal tube, NG tube, right subclavian line, right chest tube in stable position. Heart size normal. Mild bilateral pulmonary infiltrates and bilateral effusions noted. No pneumothorax. Right chest wall subcutaneous emphysema noted. IMPRESSION: 1. Lines and tubes in stable position. Right chest tube in stable position. No pneumothorax. Stable right chest wall subcutaneous emphysema . 2. Bilateral pulmonary infiltrates, right side greater left. Bilateral pleural effusions again noted. No interim change. Electronically Signed   By: Marcello Moores  Register   On: 03/08/2016 07:38   Dg Chest Portable 1 View  03/12/2016  CLINICAL DATA:  Orogastric tube placement EXAM: PORTABLE CHEST 1 VIEW COMPARISON:  03/28/2016 FINDINGS: No change right chest tube, central line, endotracheal tube, or subcutaneous emphysema on the right. No change hazy opacification over both lower lobes. The orogastric tube extends through the esophagus and into the upper abdomen without evidence of coiling. Stable sclerotic vertebral body. IMPRESSION: No evidence of coiling. Electronically Signed   By: Skipper Cliche M.D.   On: 03/08/2016 18:35   Dg Chest Port 1 View  03/25/2016  CLINICAL DATA:  Encounter for central line placement EXAM: PORTABLE CHEST 1 VIEW COMPARISON:  Portable exam at 1700 hrs compared to 1011 hrs FINDINGS: [name] is poorly localized. Tip of endotracheal tube is at the level of the aortic arch, in satisfactory position above expected position of carina. Nasogastric tube coiled in mid esophagus though the tip extends to the distal esophagus above the gastroesophageal junction ; recommend removal and replacement RIGHT thoracostomy tube newly identified. RIGHT subclavian central venous catheter with tip projecting over SVC. Numerous EKG leads project over chest. Orthopedic hardware proximal LEFT humerus. Minimal  enlargement of cardiac silhouette. Atherosclerotic calcification aorta. Prominent central pulmonary arteries. Bibasilar effusions and atelectasis. Underlying COPD changes. No pneumothorax or definite segmental consolidation. Bones demineralized. IMPRESSION: No pneumothorax following RIGHT subclavian line and RIGHT thoracostomy tube placements. Nasogastric tube coiled in mid esophagus; recommend withdrawal and replacement. COPD changes with bibasilar effusions and atelectasis. Findings called to Katie RN on [facility] on 03/06/2016 at 1735 hours. Electronically Signed   By: Lavonia Dana M.D.   On: 03/27/2016 17:37   Dg Chest Portable 1 View  03/08/2016  CLINICAL DATA:  Status post intubation and orogastric tube  placement. EXAM: PORTABLE CHEST 1 VIEW COMPARISON:  February 23, 2016. FINDINGS: Stable cardiomegaly. Stable mild central pulmonary vascular congestion is noted. Endotracheal tube tip is 4 cm above the carina and therefore in grossly good position. Nasogastric tube is seen looped within the esophagus with distal tip just above the gastroesophageal junction. Mild bilateral basilar opacities are noted concerning for edema or atelectasis with associated pleural effusions. No pneumothorax is noted. Bony thorax is unremarkable. IMPRESSION: Endotracheal tube in grossly good position. Nasogastric tube is coiled within the esophagus with distal tip just above the gastroesophageal junction. Mild bibasilar opacities are noted concerning for edema or atelectasis with associated pleural effusions. Electronically Signed   By: Marijo Conception, M.D.   On: 04/01/2016 10:30   Dg Abd Portable 1v  03/24/2016  ADDENDUM REPORT: 03/17/2016 17:54 ADDENDUM: A second image at 1718 hours is submitted which shows NG tube with tip projecting over the anticipated position of the stomach. This second image does not image the esophagus and the possibility that the tube is coiled more cranially is not excluded. I talked to the referring provider  by telephone at 1754 hours and suggested chest radiography to confirm absence of coiling more cranially. Electronically Signed   By: Skipper Cliche M.D.   On: 03/25/2016 17:54  03/06/2016  CLINICAL DATA:  Orogastric tube placement EXAM: PORTABLE ABDOMEN - 1 VIEW COMPARISON:  03/14/2016 FINDINGS: Nonobstructive gas pattern. Significant spinal scoliosis. Extensive soft tissue calcifications over the abdomen. Orogastric tube is again coiled on itself with in the distal esophagus, but does loop and extending inferiorly to the level of the gastroesophageal junction. IMPRESSION: Orogastric tube should be completely retracted and replaced. It is again coiled upon itself within the esophagus. Electronically Signed: By: Skipper Cliche M.D. On: 03/12/2016 17:28   Dg Abd Portable 1v  03/28/2016  CLINICAL DATA:  Status post intubation and OG tube placement. EXAM: PORTABLE ABDOMEN - 1 VIEW COMPARISON:  None. FINDINGS: OG tube is incompletely imaged at the upper aspects of the exam. It appears to be coiled on itself within the lower esophagus. Paucity of bowel gas within the abdomen and pelvis. No evidence of free intraperitoneal air seen. Extensive calcifications overlying the bilateral iliac bones and hips, presumably chronic soft tissue calcifications. Degenerative changes are seen throughout the scoliotic thoracolumbar spine. Patient is status post bilateral hip arthroplasties. No acute- appearing osseous abnormality. IMPRESSION: OG tube appears to be coiled on itself within the lower esophagus. Recommend repositioning. Electronically Signed   By: Franki Cabot M.D.   On: 03/14/2016 10:37   Ecg initially showed global ST depression and ST elevation in AVr. Repeat Ecg showed improvement in ST depression.  PHYSICAL EXAM General: Chronically ill, elderly, frail WF intubated. Head: Normocephalic, atraumatic, sclera non-icteric, oropharynx is clear Neck: Negative for carotid bruits. JVD not elevated. No  adenopathy Lungs: some anterior crackles Heart: RRR with grade 2/6 systolic murmur RUSB. Abdomen: Soft, non-tender, non-distended with normoactive bowel sounds. No hepatomegaly. No rebound/guarding. No obvious abdominal masses. Msk:  Strength and tone appears normal for age. Extremities: No clubbing, cyanosis or edema.  Distal pedal pulses are 2+ and equal bilaterally. Neuro: awake on vent. Responds appropriately.   ASSESSMENT AND PLAN: 1. Acute on chronic respiratory failure most consistent with acute pulmonary edema +/- PNA in setting of baseline severe COPD. On vent support per CCM. IV diuresis. I/O still positive yesterday but now Bicarb drip discontinued. On antibiotics. Tracheal aspirate + for pseudomonas aeruginosa. Plan wean and extubation per CCM.  2. NSTEMI. Troponin >65. Ecg findings c/w global ischemia in patient with known severe left main and proximal RCA disease. Seen by Dr. Roxan Hockey in June 2016 following cardiac cath. Not felt to be a surgical candidate due to severe COPD, anemia, and frailty. She has been on good medical therapy. I spoke with son who reports her quality of life has been very poor. On home oxygen and really unable to leave the house. Functional status is very poor. I reviewed films with Dr. Angelena Form. She would be very high risk for stenting.  It may be that a palliative approach would be best given her overall health. Continue IV heparin for now. On ASA and Plavix. Will resume antianginal therapy once able to take PO.  3. Severe COPD.   4. Acute on chronic Combined systolic and diastolic CHF. EF previously 40-45%. Will update Echo. Continue IV diuresis. Wean vent per CCM.   5. NSVT and SVT due to ischemia, infarct. Currently on IV amiodarone. Resume beta blocker (bisoprolol) and ARB as tolerated once extubated. Arrythymia improved over last 12 hours.  6. Acute on chronic RF. Creatinine improved today.  7. Hyperlipidemia on statin.   8.  Hypokalemia.  Present on Admission:  . Respiratory failure ([place])  Signed, Peter Martinique, [place] 03/09/2016 8:08 AM

## 2016-03-09 NOTE — Care Management Note (Signed)
Case Management Note  Patient Details  Name: [name] MRN: [id number] Date of Birth: 1934/05/09  Subjective/Objective:  Pt admitted with shortness of breath - extubated 4/4                  Action/Plan:  Pt is from home with adult son - per pt; son is an alcholic, pt denies abuse.  Pt stated that prior to admit she was having difficulty with mobility and ADLs compared with earlier.  CM requested PT/OT evaluation - pt is interested in SNF.   Expected Discharge Date:                  Expected Discharge Plan:  [place]  In-House Referral:     Discharge planning Services  CM Consult  Post Acute Care Choice:    Choice offered to:     DME Arranged:    DME Agency:     HH Arranged:    [place] Agency:     Status of Service:  In process, will continue to follow  Medicare Important Message Given:    Date Medicare IM Given:    Medicare IM give by:    Date Additional Medicare IM Given:    Additional Medicare Important Message give by:     If discussed at [place] of Stay Meetings, dates discussed:    Additional Comments:  Maryclare Labrador, RN 03/09/2016, 1:43 PM

## 2016-03-09 NOTE — Progress Notes (Signed)
Attempted ABG unsuccessful x 2 attempts and night shift RT also had unsuccessful attempts.  Unable to collect ABG sample.

## 2016-03-09 NOTE — Progress Notes (Signed)
Pharmacy Antibiotic Note  [name] is a 82 y.o. female with pseudomonal PNA on azactam and levaquin. WBC= 17.9, tmax= 100.8, SCr= 1.39 (baseline ~ 0.84-1.3) and CrCl ~ 25. Culture sensitivities pending  Plan: -Change azactam to 1gm IV q8h -No levaquin changes needed -Will follow renal function, cultures and clinical progress   Height: 5' (152.4 cm) Weight: 130 lb 15.3 oz (59.4 kg) IBW/kg (Calculated) : 45.5  Temp (24hrs), Avg:100.3 F (37.9 C), Min:99.5 F (37.5 C), Max:100.8 F (38.2 C)   Recent Labs Lab 03/10/2016 0950 03/28/2016 1010 04/01/2016 1348 03/08/16 0439 03/09/16 0358  WBC 16.6*  --   --  14.1* 17.9*  CREATININE 1.83* 1.80*  --  1.72* 1.39*  LATICACIDVEN  --  1.69 2.05*  --   --     Estimated Creatinine Clearance: 26.5 mL/min (by C-G formula based on Cr of 1.39).    Allergies  Allergen Reactions  . Keflex [Cephalexin] Itching    "severe itching"  . Nulecit [Na Ferric Gluc Cplx In Sucrose] Diarrhea, Nausea Only and Other (See Comments)      drop in BP  . Biaxin [Clarithromycin] Itching  . Spiriva Handihaler [Tiotropium Bromide Monohydrate] Other (See Comments)    constipation  . Tramadol Itching    Skin burning and itching  . Adhesive [Tape] Itching  . Clarithromycin Itching  . Darvon Itching  . Epinephrine Itching  . Fentanyl Itching  . Morphine And Related Itching  . Oxycodone Itching  . Sulfa Antibiotics Itching  . Vicodin [Hydrocodone-Acetaminophen] Itching    Can take with Benadryl    Antimicrobials this admission: 4/2 Lvq>> 4/2 aztreonam>>   Microbiology results: 4/2 Lvq>> 4/2 aztreonam>>  Thank you for allowing pharmacy to be a part of this patient's care.  Hildred Laser, Pharm D 03/09/2016 10:53 AM

## 2016-03-09 NOTE — Progress Notes (Signed)
[place] Progress Note Patient Name: [name] DOB: 1934/04/22 MRN: [id number]   Date of Service  03/09/2016  HPI/Events of Note  Low k   eICU Interventions  suipp     Intervention Category Intermediate Interventions: Electrolyte abnormality - evaluation and management  Raylene Miyamoto. 03/09/2016, 5:10 AM

## 2016-03-09 NOTE — Progress Notes (Signed)
[facility] Progress Note Patient Name: [name] DOB: 12/27/1933 MRN: [id number]   Date of Service  03/09/2016  HPI/Events of Note  R/o alk, remains on high rate  eICU Interventions  abg     Intervention Category Intermediate Interventions: Respiratory distress - evaluation and management  Raylene Miyamoto. 03/09/2016, 3:08 AM

## 2016-03-09 NOTE — Progress Notes (Signed)
[place] [place] Progress Note Patient Name: DANYELLE BROOKOVER DOB: 03-29-1934 MRN: [id number]   Date of Service  03/09/2016  HPI/Events of Note  alk  eICU Interventions  Reduce rate, repeat abg Dc bicarb drip     Intervention Category Intermediate Interventions: Diagnostic test evaluation  Raylene Miyamoto. 03/09/2016, 3:34 AM

## 2016-03-09 NOTE — Procedures (Signed)
Extubation Procedure Note  Patient Details:   Name: [name] DOB: [birth year] MRN: [id number]   Airway Documentation: Pt had audible cuff leak prior to extubation, alert and following commands.  Extubated to 4lpm [place] sat 100%, RR 20, HR 81.  Pt able to speak her name and say where she was located.  Good cough.  Will continue to monitor.    Evaluation  O2 sats: stable throughout Complications: No apparent complications Patient did tolerate procedure well. Bilateral Breath Sounds: Clear, Diminished   Yes  Ned Grace 03/09/2016, 10:31 AM

## 2016-03-09 NOTE — Progress Notes (Signed)
CRITICAL VALUE ALERT  Critical value received:  K 2.7   Date of notification:  03/09/2016   Time of notification:  [id number]  Critical value read back:Yes.    Nurse who received alert:  Eleonore Chiquito RN  MD notified (1st page):  ELINK   Time of first page:  0442  MD notified (2nd page): NA  Time of second page: NA  Responding MD:  Warren Lacy  Time MD responded:  [phone]

## 2016-03-09 NOTE — Progress Notes (Signed)
PULMONARY / CRITICAL CARE MEDICINE   Name: [name] MRN: [id number] DOB: July 02, 1934    ADMISSION DATE:  03/08/2016 CONSULTATION DATE:  4/2  REFERRING MD:  Rancour   CHIEF COMPLAINT:  Acute respiratory failure   HISTORY OF PRESENT ILLNESS:   This is a 82 year old pt w/ chronic respiratory failure f/b W[place] for COPD. Also w hx significant CAD and ischemic CM. Called EMS w/ CC weakness, shortness of breath and CP. Sats were 88%. PT given 8 mg morphine and 100% NRB. She was obtunded by time she arrived in ER she was unresponsive and presumed hypercarbic. She was therefore intubated and PCCM asked to admit.   SUBJECTIVE / Interval events:  Tolerated some PSV 4/3, and again this am  VITAL SIGNS: BP 102/66 mmHg  Pulse 82  Temp(Src) 100.2 F (37.9 C) (Core (Comment))  Resp 21  Ht 5' (1.524 m)  Wt 59.4 kg (130 lb 15.3 oz)  BMI 25.58 kg/m2  SpO2 100%  HEMODYNAMICS:    VENTILATOR SETTINGS: Vent Mode:  [-] PSV;CPAP FiO2 (%):  [40 %] 40 % Set Rate:  [14 bmp-30 bmp] 14 bmp Vt Set:  [360 mL] 360 mL PEEP:  [5 cmH20] 5 cmH20 Pressure Support:  [5 cmH20-10 cmH20] 5 cmH20 Plateau Pressure:  [14 cmH20-20 cmH20] 14 cmH20  INTAKE / OUTPUT: I/O last 3 completed shifts: In: 3760 [I.V.:3240; NG/GT:270; IV Piggyback:250] Out: 2895 [Urine:2205; Emesis/NG output:300; Chest Tube:390]  PHYSICAL EXAMINATION: General:  82 year old female sedated on vent  Neuro:  Awake, nods to questions, moves ext HEENT:  Orally intubated.  Cardiovascular:  Rrr, no murmur  Lungs:  Crackles diffuse  Abdomen:  Soft, not tender  Musculoskeletal:  Equal  Skin:  Intact   LABS:  BMET  Recent Labs Lab 03/26/2016 0950 03/20/2016 1010 03/08/16 0439 03/09/16 0358  NA 137 141 141 140  K 4.1 4.2 3.5 2.7*  CL 110 113* 109 98*  CO2 17*  --  21* 30  BUN 33* 35* 32* 21*  CREATININE 1.83* 1.80* 1.72* 1.39*  GLUCOSE 264* 249* 167* 110*    Electrolytes  Recent Labs Lab 03/15/2016 0950 03/27/2016 1346  03/08/16 0439 03/09/16 0358  CALCIUM 9.0  --  8.6* 8.4*  MG  --  2.3 2.0 1.8  PHOS  --  6.4* 3.3  --     CBC  Recent Labs Lab 03/21/2016 0950 03/29/2016 1010 03/08/16 0439 03/09/16 0358  WBC 16.6*  --  14.1* 17.9*  HGB 9.6* 11.9* 8.6* 8.2*  HCT 32.4* 35.0* 28.1* 28.0*  PLT 412*  --  290 312    Coag's  Recent Labs Lab 03/28/2016 0950 03/12/2016 1346  APTT  --  107*  INR 1.18 1.31    Sepsis Markers  Recent Labs Lab 03/13/2016 1010 03/19/2016 1346 03/21/2016 1348 03/08/16 0439 03/09/16 0358  LATICACIDVEN 1.69  --  2.05*  --   --   PROCALCITON  --  <0.10  --  0.23 0.15    ABG  Recent Labs Lab 03/15/2016 1408 03/08/16 0325 03/09/16 0330  PHART 7.108* 7.422 7.552*  PCO2ART 52.5* 30.6* 35.8  PO2ART 87.0 149* 74.0*    Liver Enzymes  Recent Labs Lab 03/26/2016 0950  AST 40  ALT 13*  ALKPHOS 74  BILITOT 0.4  ALBUMIN 3.3*    Cardiac Enzymes  Recent Labs Lab 03/30/2016 1346 03/06/2016 2011 03/11/2016 2348  TROPONINI 0.26* 30.66* >65.00*    Glucose  Recent Labs Lab 03/08/16 1205 03/08/16 1610 03/08/16 1921  03/09/16 0019 03/09/16 0353 03/09/16 0737  GLUCAP 140* 125* 100* 116* 106* 113*    Imaging Dg Chest Port 1 View  03/09/2016  CLINICAL DATA:  Respiratory failure EXAM: PORTABLE CHEST 1 VIEW COMPARISON:  03/08/2016 FINDINGS: Cardiac shadow is mildly enlarged but stable. An endotracheal tube and nasogastric catheter are again seen in stable position. Right-sided subclavian line and right thoracostomy catheter are noted in satisfactory position. Bilateral pleural effusions are again seen. No new focal abnormality is noted. Postsurgical changes in the left humerus are seen. IMPRESSION: No significant change from the prior exam. No pneumothorax is noted. Electronically Signed   By: Inez Catalina M.D.   On: 03/09/2016 08:05     STUDIES:  Echo 4/3>>>  CULTURES: BCx2 4/2>>> Respiratory culture 4/2>>> GNR >>   ANTIBIOTICS: levaquin 4/2>>> azactam  4/2>>>  SIGNIFICANT EVENTS:   LINES/TUBES: OETT 4/2>>>  DISCUSSION: Chronic resp failure d/t COPD and CAD / ischemic CM on 4 liters baseline.  Admitted with CP and acute resp failure, NSTEMI, B infiltrates consistent w acute CHF. Intubated and treated medically for her MI + empiric abx in case of possible PNA  ASSESSMENT / PLAN:  PULMONARY A: Acute on chronic respiratory failure in setting of B infiltrates, MSO4  Bibasilar atx vs infiltrates + effusions R > L.  R/o PNA Chest pain  R iatrogenic PTX P:   Full vent support SBT today > will assess fxn off PEEP, possible extubation  See ID and CV sections R chest tube to -20cm  CARDIOVASCULAR A:  HTN  Severe CAD including L main disease.  NSTEMI SVT 4/2 pm Shock  P:  Heparin + plavix + ASA Schedule metoprolol Amiodarone added 4/2 Phenylephrine gtt if needed (not currently requiring)  Appreciate Cardiology input, note that she was not a CABG candidate when assessed following L cath 6/16. PTCI would be challenging and high risk as well. Recommendation is for medical management.   RENAL A:   AKI, improving Hyperchloremia  Metabolic acidosis/NAG Hypokalemia  P:   D/c'd Bicarb gtt Strict I&O F/u chemistry   GASTROINTESTINAL A:   No acute  P:   PPI for SUP Start tube feeds soon if unable to extubate 4/4  HEMATOLOGIC A:   Anemia  P:  Trend h&h Transfuse for hgb<7 [place] heparin   INFECTIOUS A:   Possible CAP, suspect acute CHF + effusions. Note Pct initially negative > 0.23 but sputum > pseudomonas (colonizer vs active infxn).  - Pneumococcal Ag negative - Flu A&B negative P:   Sputum culture >> pseudomonas >>  Empiric levaquin + aztreonam, continue and await sensitivities   ENDOCRINE A:   DM w/ hyperglycemia  P:   ssi protocol   NEUROLOGIC A:   Acute encephalopathy Sedation for MV P:   RASS goal: 0 PAD protocol    FAMILY  - Updates:   - Inter-disciplinary family meet or Palliative Care  meeting due by:  4/9  Independent CC time 40 minutes  Baltazar Apo, MD, PhD 03/09/2016, 9:38 AM [facility] [phone] or if no answer [phone]

## 2016-03-09 NOTE — Progress Notes (Signed)
[place] for heparin Indication: ACS/STEMI  Allergies  Allergen Reactions  . Keflex [Cephalexin] Itching    "severe itching"  . Nulecit [Na Ferric Gluc Cplx In Sucrose] Diarrhea, Nausea Only and Other (See Comments)      drop in BP  . Biaxin [Clarithromycin] Itching  . Spiriva Handihaler [Tiotropium Bromide Monohydrate] Other (See Comments)    constipation  . Tramadol Itching    Skin burning and itching  . Adhesive [Tape] Itching  . Clarithromycin Itching  . Darvon Itching  . Epinephrine Itching  . Fentanyl Itching  . Morphine And Related Itching  . Oxycodone Itching  . Sulfa Antibiotics Itching  . Vicodin [Hydrocodone-Acetaminophen] Itching    Can take with Benadryl    Patient Measurements: Height: 5' (152.4 cm) Weight: 130 lb 15.3 oz (59.4 kg) IBW/kg (Calculated) : 45.5 Heparin Dosing Weight: 58 kg  Vital Signs: Temp: 100.2 F (37.9 C) (04/04 0700) BP: 102/66 mmHg (04/04 0711) Pulse Rate: 82 (04/04 0711)  Labs:  Recent Labs  03/21/2016 0950 03/29/2016 1010 03/30/2016 1346 03/27/2016 2011  03/06/2016 2348 03/08/16 0439 03/08/16 1225 03/08/16 2010 03/09/16 0358  HGB 9.6* 11.9*  --   --   --   --  8.6*  --   --  8.2*  HCT 32.4* 35.0*  --   --   --   --  28.1*  --   --  28.0*  PLT 412*  --   --   --   --   --  290  --   --  312  APTT  --   --  107*  --   --   --   --   --   --   --   LABPROT 15.2  --  16.4*  --   --   --   --   --   --   --   INR 1.18  --  1.31  --   --   --   --   --   --   --   HEPARINUNFRC  --   --   --   --   < >  --  0.21* 0.64 0.58 0.43  CREATININE 1.83* 1.80*  --   --   --   --  1.72*  --   --  1.39*  TROPONINI 0.07*  --  0.26* 30.66*  --  >65.00*  --   --   --   --   < > = values in this interval not displayed.  Estimated Creatinine Clearance: 26.5 mL/min (by C-G formula based on Cr of 1.39).  Assessment: 82 y.o. female with NSTEMI previously w/  severe L main disease and proximal RCA disease on IV  heparin, heparin level 0.43, remains therapeutic on 900 units/hr.  Goal of Therapy:  Heparin level 0.3-0.7 units/ml Monitor platelets by anticoagulation protocol: Yes   Plan:  -No heparin changes needed -Heparin level and CBC daily  Hildred Laser, Pharm D 03/09/2016 10:54 AM

## 2016-03-09 NOTE — Progress Notes (Signed)
Echocardiogram 2D Echocardiogram has been performed.  Tresa Res 03/09/2016, 2:59 PM

## 2016-03-09 NOTE — Progress Notes (Signed)
PCCM Interval Note  I reassessed pt on t-piece trial. She is tolerating relatively well. No evidence of pulm edema but she is having some chest tightness. I have told her that this could be angina, puts her at risk for failure. She understands this. I have advised her that absent any possibility of CABG or PTCI that we should optimize her medically, extubate when she is optimized and then consider deferring reintubation if she fails. She understands this and has nodded to me in agreement. We will discuss GOC further when she is extubated. I believe we will be able to successfully extubate today.   Baltazar Apo, MD, PhD 03/09/2016, 10:18 AM [facility] [phone] or if no answer [phone]

## 2016-03-10 ENCOUNTER — Inpatient Hospital Stay (HOSPITAL_COMMUNITY): Payer: Medicare Other

## 2016-03-10 DIAGNOSIS — I4729 Other ventricular tachycardia: Secondary | ICD-10-CM

## 2016-03-10 DIAGNOSIS — I5023 Acute on chronic systolic (congestive) heart failure: Secondary | ICD-10-CM

## 2016-03-10 DIAGNOSIS — I214 Non-ST elevation (NSTEMI) myocardial infarction: Secondary | ICD-10-CM | POA: Diagnosis present

## 2016-03-10 DIAGNOSIS — J9620 Acute and chronic respiratory failure, unspecified whether with hypoxia or hypercapnia: Secondary | ICD-10-CM | POA: Diagnosis present

## 2016-03-10 DIAGNOSIS — J939 Pneumothorax, unspecified: Secondary | ICD-10-CM | POA: Diagnosis present

## 2016-03-10 DIAGNOSIS — I350 Nonrheumatic aortic (valve) stenosis: Secondary | ICD-10-CM

## 2016-03-10 DIAGNOSIS — I472 Ventricular tachycardia: Secondary | ICD-10-CM

## 2016-03-10 LAB — GLUCOSE, CAPILLARY
GLUCOSE-CAPILLARY: 96 mg/dL (ref 65–99)
Glucose-Capillary: 101 mg/dL — ABNORMAL HIGH (ref 65–99)
Glucose-Capillary: 103 mg/dL — ABNORMAL HIGH (ref 65–99)
Glucose-Capillary: 94 mg/dL (ref 65–99)
Glucose-Capillary: 113 mg/dL — ABNORMAL HIGH (ref 65–99)

## 2016-03-10 LAB — BASIC METABOLIC PANEL
ANION GAP: 10 (ref 5–15)
BUN: 22 mg/dL — AB (ref 6–20)
CHLORIDE: 99 mmol/L — AB (ref 101–111)
CO2: 28 mmol/L (ref 22–32)
Calcium: 8.2 mg/dL — ABNORMAL LOW (ref 8.9–10.3)
Creatinine, Ser: 1.52 mg/dL — ABNORMAL HIGH (ref 0.44–1.00)
GFR calc Af Amer: 36 mL/min — ABNORMAL LOW (ref >60)
GFR calc non Af Amer: 31 mL/min — ABNORMAL LOW (ref >60)
GLUCOSE: 95 mg/dL (ref 65–99)
POTASSIUM: 4.4 mmol/L (ref 3.5–5.1)
Sodium: 137 mmol/L (ref 135–145)

## 2016-03-10 LAB — CULTURE, RESPIRATORY

## 2016-03-10 LAB — CBC
HCT: 22.8 % — ABNORMAL LOW (ref 36.0–46.0)
HEMOGLOBIN: 6.7 g/dL — AB (ref 12.0–15.0)
MCH: 26.2 pg (ref 26.0–34.0)
MCHC: 29.4 g/dL — AB (ref 30.0–36.0)
MCV: 89.1 fL (ref 78.0–100.0)
Platelets: 305 10*3/uL (ref 150–400)
RBC: 2.56 MIL/uL — AB (ref 3.87–5.11)
RDW: 17.5 % — ABNORMAL HIGH (ref 11.5–15.5)
WBC: 15.5 10*3/uL — ABNORMAL HIGH (ref 4.0–10.5)

## 2016-03-10 LAB — PREPARE RBC (CROSSMATCH)

## 2016-03-10 LAB — HEPARIN LEVEL (UNFRACTIONATED): Heparin Unfractionated: 0.21 IU/mL — ABNORMAL LOW (ref 0.30–0.70)

## 2016-03-10 LAB — CULTURE, RESPIRATORY W GRAM STAIN

## 2016-03-10 MED ORDER — SODIUM CHLORIDE 0.9 % IV SOLN
Freq: Once | INTRAVENOUS | Status: AC
  Administered 2016-03-10: 06:00:00 via INTRAVENOUS

## 2016-03-10 MED ORDER — METOPROLOL TARTRATE 12.5 MG HALF TABLET
12.5000 mg | ORAL_TABLET | Freq: Two times a day (BID) | ORAL | Status: DC
  Administered 2016-03-11 – 2016-03-15 (×9): 12.5 mg via ORAL
  Filled 2016-03-10 (×10): qty 1

## 2016-03-10 MED ORDER — HYDROCODONE-ACETAMINOPHEN 5-325 MG PO TABS
1.0000 | ORAL_TABLET | Freq: Two times a day (BID) | ORAL | Status: DC | PRN
  Administered 2016-03-12: 1 via ORAL
  Filled 2016-03-10 (×2): qty 1

## 2016-03-10 MED ORDER — ACETAMINOPHEN 325 MG PO TABS
650.0000 mg | ORAL_TABLET | ORAL | Status: DC | PRN
  Administered 2016-03-10 – 2016-03-11 (×6): 650 mg via ORAL
  Filled 2016-03-10 (×6): qty 2

## 2016-03-10 MED ORDER — LEVOFLOXACIN IN D5W 750 MG/150ML IV SOLN
750.0000 mg | INTRAVENOUS | Status: DC
  Filled 2016-03-10: qty 150

## 2016-03-10 MED ORDER — LORAZEPAM 0.5 MG PO TABS
0.2500 mg | ORAL_TABLET | Freq: Three times a day (TID) | ORAL | Status: DC | PRN
  Administered 2016-03-10 – 2016-03-12 (×3): 0.25 mg via ORAL
  Filled 2016-03-10 (×3): qty 1

## 2016-03-10 MED ORDER — LORAZEPAM 0.5 MG PO TABS: 0.5000 mg | ORAL_TABLET | Freq: Three times a day (TID) | ORAL | Status: DC | PRN

## 2016-03-10 MED ORDER — ONDANSETRON HCL 4 MG/2ML IJ SOLN
4.0000 mg | Freq: Four times a day (QID) | INTRAMUSCULAR | Status: DC | PRN
  Administered 2016-03-10: 8 mg via INTRAVENOUS
  Filled 2016-03-10: qty 4

## 2016-03-10 NOTE — Evaluation (Signed)
Physical Therapy Evaluation Patient Details Name: [name] MRN: [id number] DOB: 1934-07-13 Today's Date: 03/10/2016   History of Present Illness  Patient is a 82 y/o female wtih hx of pernicious anemia, OA, fibromyalgia, HTH, COPD, esophageal adenocarcinoma presents with weakness, shortness of breath and CP, intubated on arrival 4/2-4/4. Pt with acute resp failure, NSTEMI, B infiltrates consistent w acute CHF, PTX s/p chest tube placement  Clinical Impression  Patient presents with pain, generalized weakness, hypotension and balance deficits impacting mobility. Tolerated SPT to chair with Min A of 2. Supine BP 109/77, Sitting BP 120/70. Mobility limited due to pain at chest tube site. Pt reports living at home with son who "drinks too much" and he will not be able to help at home. Would benefit from ST SNF to maximize independence and mobility prior to return home.     Follow Up Recommendations SNF;Supervision/Assistance - 24 hour    Equipment Recommendations  None recommended by PT    Recommendations for Other Services OT consult     Precautions / Restrictions Precautions Precautions: Fall Restrictions Weight Bearing Restrictions: No      Mobility  Bed Mobility Overal bed mobility: Needs Assistance Bed Mobility: Supine to Sit     Supine to sit: Min assist;HOB elevated     General bed mobility comments: Assist to elevate trunk. No dizziness.   Transfers Overall transfer level: Needs assistance Equipment used: 2 person hand held assist Transfers: Sit to/from [facility] Sit to Stand: Min assist Stand pivot transfers: Min assist;+2 safety/equipment       General transfer comment: Assist to boost from EOB; assist of 2 for SPT to chair with 1 person managing chest tube/lines.  Ambulation/Gait                Stairs            Wheelchair Mobility    Modified Rankin (Stroke Patients Only)       Balance Overall balance  assessment: Needs assistance Sitting-balance support: Feet supported;Bilateral upper extremity supported Sitting balance-Leahy Scale: Fair     Standing balance support: During functional activity Standing balance-Leahy Scale: Poor Standing balance comment: Reliant on BUes for support in standing.                              Pertinent Vitals/Pain Pain Assessment: Faces Faces Pain Scale: Hurts even more Pain Location: chest tube site Pain Descriptors / Indicators: Sore Pain Intervention(s): Monitored during session;Repositioned;Patient requesting pain meds-RN notified    Home Living Family/patient expects to be discharged to:: Skilled nursing facility                      Prior Function Level of Independence: Independent;Needs assistance   Gait / Transfers Assistance Needed: Pt reports independence without use of AD  ADL's / Homemaking Assistance Needed: Requires independence for ADLs. Does cooking.   Comments: Reports she lives with her son who is an alcoholic.     Hand Dominance   Dominant Hand: Right    Extremity/Trunk Assessment   Upper Extremity Assessment: Defer to OT evaluation           Lower Extremity Assessment: Generalized weakness         Communication   Communication: No difficulties  Cognition Arousal/Alertness: Awake/alert Behavior During Therapy: WFL for tasks assessed/performed Overall Cognitive Status: Within Functional Limits for tasks assessed  General Comments General comments (skin integrity, edema, etc.): Pain limiting mobility today.     Exercises        Assessment/Plan    PT Assessment Patient needs continued PT services  PT Diagnosis Difficulty walking;Acute pain   PT Problem List Decreased strength;Pain;Cardiopulmonary status limiting activity;Decreased activity tolerance;Decreased balance;Decreased mobility  PT Treatment Interventions Balance training;Gait  training;Functional mobility training;Therapeutic activities;Therapeutic exercise;Patient/family education   PT Goals (Current goals can be found in the Care Plan section) Acute Rehab PT Goals Patient Stated Goal: to go to rehab PT Goal Formulation: With patient Time For Goal Achievement: 03/24/16 Potential to Achieve Goals: Good    Frequency Min 3X/week   Barriers to discharge Decreased caregiver support      Co-evaluation               End of Session Equipment Utilized During Treatment: Gait belt;Oxygen Activity Tolerance: Patient limited by pain Patient left: in chair;with call bell/phone within reach Nurse Communication: Mobility status         Time: [id number] PT Time Calculation (min) (ACUTE ONLY): 17 min   Charges:   PT Evaluation $PT Eval Moderate Complexity: 1 Procedure     PT G Codes:        Jaelynn Pozo A Lamarkus Nebel 03/10/2016, 12:12 PM Wray Kearns, [place], DPT [phone]

## 2016-03-10 NOTE — Progress Notes (Signed)
[place] Progress Note Patient Name: [name] DOB: 08-17-1934 MRN: [id number]   Date of Service  03/10/2016  HPI/Events of Note  Anemia, no bleeding clinically  eICU Interventions  Tx 1 unit     Intervention Category Intermediate Interventions: Diagnostic test evaluation  Raylene Miyamoto. 03/10/2016, 4:10 AM

## 2016-03-10 NOTE — Care Management Important Message (Signed)
Important Message  Patient Details  Name: [name] MRN: [id number] Date of Birth: Dec 15, 1933   Medicare Important Message Given:  Other (see comment)    [place], Haelee Bolen Ab[place] 03/10/2016, 11:40 AM

## 2016-03-10 NOTE — Progress Notes (Signed)
Pharmacy Antibiotic Note  [name] is a 82 y.o. female with pseudomonal PNA (on azactam and levaquin. WBC= 17.9, tmax= 100.8, SCr= 1.52 (baseline ~ 0.84-1.3) and CrCl ~ 25. -cultures show sensitivity to levaquin. Spoke to Dr. Lamonte Sakai and will change to levaquin only for a total of 7 day (day 3/7)  Plan: -Change levaquin to 750mg  IV q48hr for 7 days total -Will follow renal function, cultures and clinical progress   Height: 5' (152.4 cm) Weight: 133 lb 9.6 oz (60.6 kg) IBW/kg (Calculated) : 45.5  Temp (24hrs), Avg:100.2 F (37.9 C), Min:99.7 F (37.6 C), Max:100.8 F (38.2 C)   Recent Labs Lab 03/15/2016 0950 03/23/2016 1010 03/15/2016 1348 03/08/16 0439 03/09/16 0358 03/09/16 1610 03/10/16 0335  WBC 16.6*  --   --  14.1* 17.9*  --  15.5*  CREATININE 1.83* 1.80*  --  1.72* 1.39* 1.35* 1.52*  LATICACIDVEN  --  1.69 2.05*  --   --   --   --     Estimated Creatinine Clearance: 24.4 mL/min (by C-G formula based on Cr of 1.52).    Allergies  Allergen Reactions  . Keflex [Cephalexin] Itching    "severe itching"  . Nulecit [Na Ferric Gluc Cplx In Sucrose] Diarrhea, Nausea Only and Other (See Comments)      drop in BP  . Biaxin [Clarithromycin] Itching  . Spiriva Handihaler [Tiotropium Bromide Monohydrate] Other (See Comments)    constipation  . Tramadol Itching    Skin burning and itching  . Adhesive [Tape] Itching  . Clarithromycin Itching  . Darvon Itching  . Epinephrine Itching  . Fentanyl Itching  . Morphine And Related Itching  . Oxycodone Itching  . Sulfa Antibiotics Itching  . Vicodin [Hydrocodone-Acetaminophen] Itching    Can take with Benadryl    Antimicrobials this admission: 4/2 Lvq>> (4/9) 4/2 aztreonam>> 4/5   Microbiology results: 4/2 resp- pseudomonas (pan-S) 4/2 BCx2>> ngtd Flu - neg 4/2 PCR- neg  Thank you for allowing pharmacy to be a part of this patient's care.  Hildred Laser, Pharm D 03/10/2016 8:56 AM

## 2016-03-10 NOTE — Progress Notes (Signed)
CRITICAL VALUE ALERT  Critical value received:  hgb 6.7  Date of notification:  03/10/2016   Time of notification: 0400  Critical value read back:Yes.    Nurse who received alert:  Norberta Keens  MD notified (1st page):  ELINK  Time of first page:  0408   MD notified (2nd page): NA  Time of second page: NA  Responding MD:  Warren Lacy  Time MD responded:  Warren Lacy

## 2016-03-10 NOTE — Progress Notes (Signed)
eLink Physician-Brief Progress Note Patient Name: JOELIE IHRIG DOB: 1934-08-13 MRN: [id number]   Date of Service  03/10/2016  HPI/Events of Note  anxiety  eICU Interventions  Home ativan prn added at hald fose     Intervention Category Minor Interventions: Routine modifications to care plan (e.g. PRN medications for pain, fever)  Raylene Miyamoto. 03/10/2016, 12:41 AM

## 2016-03-10 NOTE — Progress Notes (Signed)
PULMONARY / CRITICAL CARE MEDICINE   Name: [name] MRN: [id number] DOB: 1934-11-15    ADMISSION DATE:  03/06/2016 CONSULTATION DATE:  4/2  REFERRING MD:  [place]   CHIEF COMPLAINT:  Acute respiratory failure   HISTORY OF PRESENT ILLNESS:   This is a 82 year old pt w/ chronic respiratory failure f/b W[place] for COPD. Also w hx significant CAD and ischemic CM. Called EMS w/ CC weakness, shortness of breath and CP. Sats were 88%. PT given 8 mg morphine and 100% NRB. She was obtunded by time she arrived in ER she was unresponsive and presumed hypercarbic. She was therefore intubated and PCCM asked to admit.   SUBJECTIVE / Interval events:  Extubated successfully after t-piece yesterday High output from her chest tube: ~250-300cc this am Up to chair, denies any chest tightness at this time but pain at the chest tube site Note phenylephrine started overnight  VITAL SIGNS: BP 110/67 mmHg  Pulse 87  Temp(Src) 99.9 F (37.7 C) (Core (Comment))  Resp 15  Ht 5' (1.524 m)  Wt 60.6 kg (133 lb 9.6 oz)  BMI 26.09 kg/m2  SpO2 100%  HEMODYNAMICS:    VENTILATOR SETTINGS:    INTAKE / OUTPUT: I/O last 3 completed shifts: In: 2548.2 [P.O.:250; I.V.:1683.2; Blood:25; NG/GT:90; IV Piggyback:500] Out: 53 [Urine:2830; Emesis/NG output:50; Chest Tube:400]  PHYSICAL EXAMINATION: General:  82 year old female, ill appearing, up to chair Neuro:  Awake, oriented, interacting HEENT:  OP clear Cardiovascular:  Rrr, 2/6 systolic M  Lungs:  Scattered B crackles, decreased at bases.  Abdomen:  Soft, not tender  Musculoskeletal:  Equal  Skin:  Intact   LABS:  BMET  Recent Labs Lab 03/09/16 0358 03/09/16 1610 03/10/16 0335  NA 140 139 137  K 2.7* 4.1 4.4  CL 98* 99* 99*  CO2 30 29 28   BUN 21* 20 22*  CREATININE 1.39* 1.35* 1.52*  GLUCOSE 110* 111* 95    Electrolytes  Recent Labs Lab 03/12/2016 1346 03/08/16 0439 03/09/16 0358 03/09/16 1610 03/10/16 0335  CALCIUM  --   8.6* 8.4* 8.2* 8.2*  MG 2.3 2.0 1.8  --   --   PHOS 6.4* 3.3  --   --   --     CBC  Recent Labs Lab 03/08/16 0439 03/09/16 0358 03/10/16 0335  WBC 14.1* 17.9* 15.5*  HGB 8.6* 8.2* 6.7*  HCT 28.1* 28.0* 22.8*  PLT 290 312 305    Coag's  Recent Labs Lab 03/09/2016 0950 03/28/2016 1346  APTT  --  107*  INR 1.18 1.31    Sepsis Markers  Recent Labs Lab 03/06/2016 1010 03/11/2016 1346 03/22/2016 1348 03/08/16 0439 03/09/16 0358  LATICACIDVEN 1.69  --  2.05*  --   --   PROCALCITON  --  <0.10  --  0.23 0.15    ABG  Recent Labs Lab 04/03/2016 1408 03/08/16 0325 03/09/16 0330  PHART 7.108* 7.422 7.552*  PCO2ART 52.5* 30.6* 35.8  PO2ART 87.0 149* 74.0*    Liver Enzymes  Recent Labs Lab 03/27/2016 0950  AST 40  ALT 13*  ALKPHOS 74  BILITOT 0.4  ALBUMIN 3.3*    Cardiac Enzymes  Recent Labs Lab 03/22/2016 1346 03/24/2016 2011 03/08/2016 2348  TROPONINI 0.26* 30.66* >65.00*    Glucose  Recent Labs Lab 03/09/16 1227 03/09/16 1612 03/09/16 1942 03/09/16 2353 03/10/16 0349 03/10/16 0753  GLUCAP 88 83 99 101* 101* 96    Imaging Dg Chest Port 1 View  03/10/2016  CLINICAL DATA:  F/U Respiratory Failure EXAM: PORTABLE CHEST - 1 VIEW COMPARISON:  the previous day's study FINDINGS: Right chest tube stable in position. No pneumothorax. Probable bilateral pleural effusions as before. Bibasilar consolidation/ atelectasis left greater than right stable. Endotracheal tube and nasogastric tube have been removed. Heart size upper limits normal for technique. Atheromatous ectatic thoracic aorta. Changes of kyphoplasty/ vertebroplasty at 3 mid thoracic levels. Mild thoracolumbar dextroscoliosis. Surgical clips at the thoracic inlet on the left. Orthopedic hardware in the left humeral head partially visualized. IMPRESSION: 1. Interval extubation. 2. Stable right chest tube with no pneumothorax. 3. Bilateral pleural effusions and bibasilar atelectasis/consolidation, stable.  Electronically Signed   By: Lucrezia Europe M.D.   On: 03/10/2016 07:37     STUDIES:  Echo 4/3>>> severe AS, decrease in her LV fxn to [id number], grade 2 diastolic dysfxn  CULTURES: BCx2 4/2>>> Respiratory culture 4/2>>> pseudomonas (pan-sens)  ANTIBIOTICS: levaquin 4/2>>> azactam 4/2>>> 4/5  SIGNIFICANT EVENTS:   LINES/TUBES: OETT 4/2>>> 4/4  DISCUSSION: Chronic resp failure d/t COPD and CAD / ischemic CM on 4 liters baseline.  Admitted with CP and acute resp failure, NSTEMI, B infiltrates consistent w acute CHF. Intubated and treated medically for her MI + empiric abx in case of possible PNA  ASSESSMENT / PLAN:  PULMONARY A: Acute on chronic respiratory failure in setting of B infiltrates, MSO4  Bibasilar atx vs infiltrates + effusions R > L.  R/o PNA Chest pain  R iatrogenic PTX P:   Push pulmonary hygiene See ID and CV sections R chest tube to waterseal 4/5. High output likely due to her CHF, unclear whether this will decrease, may have to consider pulling tube with higher output vs possibly discuss palliative pleurex. Drainage is blood-tinged, heprain stopped 4/5 as below  CARDIOVASCULAR A:  HTN  Severe CAD including L main disease.  Severe AS NSTEMI SVT 4/2 pm Shock  P:  plavix + [id number] Heparin gtt stopped 4/5 Scheduled metoprolol Amiodarone added 4/2, stopped 4/4 Wean phenylephrine to off, currently on 25 Appreciate Cardiology input, note that she was not a CABG candidate when assessed following L cath 6/16. PTCI would be challenging and high risk as well. Also not an AVR candidate. Recommendation is for medical management, palliation.   RENAL A:   Acute renal failure, slightly worse 4/5 Hyperchloremia  Metabolic acidosis/NAG Hypokalemia  P:   Decrease lasix 4/5 to 40 PO bid Hold any further toradol or NSAIDS for at least 24h Strict I&O F/u chemistry   GASTROINTESTINAL A:   No acute  P:   PPI for SUP Heart healthy diet  HEMATOLOGIC A:   Anemia,  received 1u PRBC 4/4 pm P:  Trend h&h Transfuse for hgb<7 [place] heparin   INFECTIOUS A:   Possible CAP, suspect acute CHF + effusions. Note Pct initially negative > 0.23 but sputum > pseudomonas (colonizer vs active infxn).  - Pneumococcal Ag negative - Flu A&B negative P:   Sputum culture >> pseudomonas >> pan-sens D/c aztreonam, continue levaquin for 7 days total abx. Change to PO when able  ENDOCRINE A:   DM w/ hyperglycemia  P:   ssi protocol   NEUROLOGIC A:   Acute encephalopathy, resolved Sedation for MV P:   RASS goal: 0 PAD protocol  Restarted her home ativan at half dose  GLOBAL:  I discussed active problems, status and prognosis with pt 4/5. She understands current issues. She confirms with me that she would not want intubation or ACLS under any circumstances. I will place  DNR orders on the chart.   Will assess for transfer to SDU if we can get pressors weaned to off today.    FAMILY  - Updates: no family present 45/5  - Inter-disciplinary family meet or Palliative Care meeting due by:  4/9  Independent CC time 48 minutes  Baltazar Apo, MD, PhD 03/10/2016, 10:56 AM [facility] [phone] or if no answer [phone]

## 2016-03-10 NOTE — Progress Notes (Signed)
[place] for heparin Indication: ACS/STEMI  Allergies  Allergen Reactions  . Keflex [Cephalexin] Itching    "severe itching"  . Nulecit [Na Ferric Gluc Cplx In Sucrose] Diarrhea, Nausea Only and Other (See Comments)      drop in BP  . Biaxin [Clarithromycin] Itching  . Spiriva Handihaler [Tiotropium Bromide Monohydrate] Other (See Comments)    constipation  . Tramadol Itching    Skin burning and itching  . Adhesive [Tape] Itching  . Clarithromycin Itching  . Darvon Itching  . Epinephrine Itching  . Fentanyl Itching  . Morphine And Related Itching  . Oxycodone Itching  . Sulfa Antibiotics Itching  . Vicodin [Hydrocodone-Acetaminophen] Itching    Can take with Benadryl    Patient Measurements: Height: 5' (152.4 cm) Weight: 133 lb 9.6 oz (60.6 kg) IBW/kg (Calculated) : 45.5 Heparin Dosing Weight: 58 kg  Vital Signs: Temp: 99.9 F (37.7 C) (04/05 0715) BP: 110/67 mmHg (04/05 0715) Pulse Rate: 87 (04/05 0715)  Labs:  Recent Labs  03/14/2016 0950  03/29/2016 1346 03/27/2016 2011  03/10/2016 2348 03/08/16 0439  03/08/16 2010 03/09/16 0358 03/09/16 1610 03/10/16 0335  HGB 9.6*  < >  --   --   --   --  8.6*  --   --  8.2*  --  6.7*  HCT 32.4*  < >  --   --   --   --  28.1*  --   --  28.0*  --  22.8*  PLT 412*  --   --   --   --   --  290  --   --  312  --  305  APTT  --   --  107*  --   --   --   --   --   --   --   --   --   LABPROT 15.2  --  16.4*  --   --   --   --   --   --   --   --   --   INR 1.18  --  1.31  --   --   --   --   --   --   --   --   --   HEPARINUNFRC  --   --   --   --   < >  --  0.21*  < > 0.58 0.43  --  0.21*  CREATININE 1.83*  < >  --   --   --   --  1.72*  --   --  1.39* 1.35* 1.52*  TROPONINI 0.07*  --  0.26* 30.66*  --  >65.00*  --   --   --   --   --   --   < > = values in this interval not displayed.  Estimated Creatinine Clearance: 24.4 mL/min (by C-G formula based on Cr of 1.52).  Assessment: 82  y.o. female with NSTEMI previously w/  severe L main disease and proximal RCA disease on IV heparin, heparin level is 0.21 and below goal on 900 units/hr. -Hg= 6.7 (down from 8.2), no bleeding noted. For transfusion today  Goal of Therapy:  Heparin level 0.3-0.7 units/ml Monitor platelets by anticoagulation protocol: Yes   Plan:  -Will not increase heparin with change in hg -No plans noted for cath. Consider d/c heparin? -Heparin level and CBC daily  Hildred Laser, Pharm D 03/10/2016 8:17 AM

## 2016-03-10 NOTE — Progress Notes (Signed)
TELEMETRY: Reviewed telemetry pt in NSR with rare PVCs.   Filed Vitals:   03/10/16 0700 03/10/16 0710 03/10/16 0713 03/10/16 0715  BP: 90/55   110/67  Pulse: 80 82  87  Temp: 99.9 F (37.7 C) 99.9 F (37.7 C)  99.9 F (37.7 C)  TempSrc:      Resp: _0 Height:      Weight:      SpO2: 100% 97% 100% 100%    Intake/Output Summary (Last 24 hours) at 03/10/16 0915 Last data filed at 03/10/16 0809  Gross per 24 hour  Intake 1561.98 ml  Output   2320 ml  Net -758.02 ml   Filed Weights   03/08/16 0454 03/09/16 0500 03/10/16 0451  Weight: 59.5 kg (131 lb 2.8 oz) 59.4 kg (130 lb 15.3 oz) 60.6 kg (133 lb 9.6 oz)    Subjective Patient extubated yesterday. States breathing is doing much better. Notes some pain at chest tube site.  Expresses desire to never go back on vent.  Marland Kitchen antiseptic oral rinse  7 mL Mouth Rinse QID  . arformoterol  15 mcg Nebulization BID  . aspirin EC  81 mg Oral Daily  . atorvastatin  80 mg Per Tube q1800  . budesonide (PULMICORT) nebulizer solution  0.5 mg Nebulization BID  . chlorhexidine gluconate (SAGE KIT)  15 mL Mouth Rinse BID  . clopidogrel  75 mg Per Tube Daily  . furosemide  40 mg Intravenous Q12H  . insulin aspart  0-15 Units Subcutaneous 6 times per day  . ipratropium-albuterol  3 mL Nebulization Q6H  . [START ON 03/11/2016] levofloxacin (LEVAQUIN) IV  750 mg Intravenous Q48H  . loratadine  10 mg Per Tube q morning - 10a  . metoprolol  5 mg Intravenous 4 times per day  . pantoprazole (PROTONIX) IV  40 mg Intravenous Daily  . senna  1 tablet Per Tube QHS   . amiodarone 30 mg/hr (03/10/16 0500)  . heparin 900 Units/hr (03/10/16 0500)  . phenylephrine (NEO-SYNEPHRINE) Adult infusion 25 mcg/min (03/10/16 0641)  . propofol (DIPRIVAN) infusion Stopped (04/03/2016 1315)  . propofol (DIPRIVAN) infusion      LABS: Basic Metabolic Panel:  Recent Labs  03/12/2016 1346 03/08/16 0439 03/09/16 0358 03/09/16 1610 03/10/16 0335  NA  --  141  140 139 137  K  --  3.5 2.7* 4.1 4.4  CL  --  109 98* 99* 99*  CO2  --  21* _1 GLUCOSE  --  167* 110* 111* 95  BUN  --  32* 21* 20 22*  CREATININE  --  1.72* 1.39* 1.35* 1.52*  CALCIUM  --  8.6* 8.4* 8.2* 8.2*  MG 2.3 2.0 1.8  --   --   PHOS 6.4* 3.3  --   --   --    Liver Function Tests:  Recent Labs  03/17/2016 0950  AST 40  ALT 13*  ALKPHOS 74  BILITOT 0.4  PROT 6.9  ALBUMIN 3.3*   No results for input(s): LIPASE, AMYLASE in the last 72 hours. CBC:  Recent Labs  03/13/2016 0950  03/09/16 0358 03/10/16 0335  WBC 16.6*  < > 17.9* 15.5*  NEUTROABS 10.9*  --   --   --   HGB 9.6*  < > 8.2* 6.7*  HCT 32.4*  < > 28.0* 22.8*  MCV 93.6  < > 88.6 89.1  PLT 412*  < > 312 305  < > = values in  this interval not displayed. Cardiac Enzymes:  Recent Labs  03/24/2016 1346 03/23/2016 2011 03/22/2016 2348  TROPONINI 0.26* 30.66* >65.00*   BNP: No results for input(s): PROBNP in the last 72 hours. D-Dimer: No results for input(s): DDIMER in the last 72 hours. Hemoglobin A1C:  Recent Labs  03/29/2016 1347  HGBA1C 5.7*   Fasting Lipid Panel:  Recent Labs  03/30/2016 1346  TRIG 93   Thyroid Function Tests: No results for input(s): TSH, T4TOTAL, T3FREE, THYROIDAB in the last 72 hours.  Invalid input(s): FREET3   Radiology/Studies:  Dg Chest Port 1 View  03/10/2016  CLINICAL DATA:  F/U Respiratory Failure EXAM: PORTABLE CHEST - 1 VIEW COMPARISON:  the previous day's study FINDINGS: Right chest tube stable in position. No pneumothorax. Probable bilateral pleural effusions as before. Bibasilar consolidation/ atelectasis left greater than right stable. Endotracheal tube and nasogastric tube have been removed. Heart size upper limits normal for technique. Atheromatous ectatic thoracic aorta. Changes of kyphoplasty/ vertebroplasty at 3 mid thoracic levels. Mild thoracolumbar dextroscoliosis. Surgical clips at the thoracic inlet on the left. Orthopedic hardware in the left  humeral head partially visualized. IMPRESSION: 1. Interval extubation. 2. Stable right chest tube with no pneumothorax. 3. Bilateral pleural effusions and bibasilar atelectasis/consolidation, stable. Electronically Signed   By: Lucrezia Europe M.D.   On: 03/10/2016 07:37   Dg Chest Port 1 View  03/09/2016  CLINICAL DATA:  Respiratory failure EXAM: PORTABLE CHEST 1 VIEW COMPARISON:  03/08/2016 FINDINGS: Cardiac shadow is mildly enlarged but stable. An endotracheal tube and nasogastric catheter are again seen in stable position. Right-sided subclavian line and right thoracostomy catheter are noted in satisfactory position. Bilateral pleural effusions are again seen. No new focal abnormality is noted. Postsurgical changes in the left humerus are seen. IMPRESSION: No significant change from the prior exam. No pneumothorax is noted. Electronically Signed   By: Inez Catalina M.D.   On: 03/09/2016 08:05   Ecg initially showed global ST depression and ST elevation in AVr. Repeat Ecg showed improvement in ST depression.  Echo: Study Conclusions  - Left ventricle: The cavity size was normal. Wall thickness was  normal. Systolic function was moderately to severely reduced. The  estimated ejection fraction was in the range of 30% to 35%.  Diffuse hypokinesis. Features are consistent with a pseudonormal  left ventricular filling pattern, with concomitant abnormal  relaxation and increased filling pressure (grade 2 diastolic  dysfunction). Doppler parameters are consistent with high  ventricular filling pressure. - Aortic valve: Valve mobility was restricted. There was severe  stenosis. There was trivial regurgitation. Valve area (VTI): 0.93  cm^2. Valve area (Vmax): 0.91 cm^2. Valve area (Vmean): 0.78  cm^2. - Mitral valve: Calcified annulus. Mildly thickened leaflets .  There was mild regurgitation. - Left atrium: The atrium was moderately dilated. - Pulmonary arteries: Systolic pressure was mildly  increased. PA  peak pressure: 41 mm Hg (S).  Impressions:  - Moderate to severe global reduction in LV function; grade 2  diastolic dysfunction with elevated LV filling pressure; severely  calcified aortic valve with reduced cusp excursion; severe AS by  continuity equation (mean gradient 14 mmHg; AVA 0.9 cm2); trace  AI; MAC with mild MR; moderate LAE; trace TR with mildly elevated  pulmonary pressure; consider dobutamine echo to further assess  severity of AS.  PHYSICAL EXAM General: Chronically ill, elderly, frail WF Head: Normocephalic, atraumatic, sclera non-icteric, oropharynx is clear Neck: Negative for carotid bruits. JVD not elevated. No adenopathy Lungs: few rales. No  edema. Heart: RRR with grade 2/6 systolic murmur RUSB. Abdomen: Soft, non-tender, non-distended with normoactive bowel sounds. No hepatomegaly. No rebound/guarding. No obvious abdominal masses. Msk:  Strength and tone appears normal for age. Extremities: No clubbing, cyanosis or edema.  Distal pedal pulses are 2+ and equal bilaterally. Neuro: awake, alert. Oriented.  Responds appropriately.   ASSESSMENT AND PLAN: 1. Acute on chronic respiratory failure most consistent with acute pulmonary edema +/- PNA in setting of baseline severe COPD. S/p right chest tube placement for pneumothorax. now extubated.  IV diuresis.  On antibiotics. Tracheal aspirate + for pseudomonas aeruginosa. Plans per CCM.   2. NSTEMI. Troponin >65. Ecg findings c/w global ischemia in patient with known severe left main and proximal RCA disease. Seen by Dr. Roxan Hockey in June 2016 following cardiac cath. Not felt to be a surgical candidate due to severe COPD, anemia, and frailty. She has been on good medical therapy. Her quality of life has been very poor. On home oxygen and really unable to leave the house. Functional status is very poor. I reviewed films with Dr. Angelena Form. She is not a candidate  for stenting. With her coronary  status alone it would be very high risk and given comorbid conditions of severe COPD, Severe AS, and severe LV dysfunction she would not survive the procedure and I think it is unlikely to improve her quality of life.  I would recommend palliative care. Will DC IV heparin. On ASA and Plavix. Unable to tolerate additional antianginal therapy due to low BP.   3. Severe COPD.   4. Acute on chronic Combined systolic and diastolic CHF. EF dropped to 30-35% with recent MI.  Continue IV diuresis. Additional medication limited by low BP.  5. NSVT and SVT due to ischemia, infarct. This has improved. Will DC amiodarone now.   6. Acute on chronic RF.   7. Hyperlipidemia on statin.   8. Hypokalemia- repleted  9. Right pneumothorax. CT in place  10. Severe AS  Present on Admission:  . Respiratory failure ([place])  Signed, Mackynzie Woolford [place], [place] 03/10/2016 9:15 AM

## 2016-03-10 NOTE — Progress Notes (Signed)
[facility] called and notified of pt having temp of 100 F. Orders received to still transfuse. Will continue to monitor closely. Eleonore Chiquito RN 2 [place]

## 2016-03-11 ENCOUNTER — Inpatient Hospital Stay (HOSPITAL_COMMUNITY): Payer: Medicare Other

## 2016-03-11 ENCOUNTER — Encounter (HOSPITAL_COMMUNITY): Payer: Self-pay | Admitting: *Deleted

## 2016-03-11 DIAGNOSIS — Z789 Other specified health status: Secondary | ICD-10-CM

## 2016-03-11 LAB — CBC
HCT: 25.3 % — ABNORMAL LOW (ref 36.0–46.0)
HEMOGLOBIN: 7.7 g/dL — AB (ref 12.0–15.0)
MCH: 26.8 pg (ref 26.0–34.0)
MCHC: 30.4 g/dL (ref 30.0–36.0)
MCV: 88.2 fL (ref 78.0–100.0)
Platelets: 249 10*3/uL (ref 150–400)
RBC: 2.87 MIL/uL — AB (ref 3.87–5.11)
RDW: 17.4 % — ABNORMAL HIGH (ref 11.5–15.5)
WBC: 10.2 10*3/uL (ref 4.0–10.5)

## 2016-03-11 LAB — GLUCOSE, CAPILLARY
GLUCOSE-CAPILLARY: 92 mg/dL (ref 65–99)
GLUCOSE-CAPILLARY: 84 mg/dL (ref 65–99)
Glucose-Capillary: 100 mg/dL — ABNORMAL HIGH (ref 65–99)
Glucose-Capillary: 92 mg/dL (ref 65–99)

## 2016-03-11 LAB — BASIC METABOLIC PANEL
Anion gap: 10 (ref 5–15)
BUN: 20 mg/dL (ref 6–20)
CO2: 28 mmol/L (ref 22–32)
CREATININE: 1.4 mg/dL — AB (ref 0.44–1.00)
Calcium: 9.2 mg/dL (ref 8.9–10.3)
Chloride: 100 mmol/L — ABNORMAL LOW (ref 101–111)
GFR, EST AFRICAN AMERICAN: 40 mL/min — AB (ref >60)
GFR, EST NON AFRICAN AMERICAN: 35 mL/min — AB (ref >60)
Glucose, Bld: 104 mg/dL — ABNORMAL HIGH (ref 65–99)
POTASSIUM: 4.3 mmol/L (ref 3.5–5.1)
SODIUM: 138 mmol/L (ref 135–145)

## 2016-03-11 LAB — TYPE AND SCREEN
ABO/RH(D): O NEG
Antibody Screen: NEGATIVE
Unit division: 0

## 2016-03-11 LAB — MAGNESIUM: MAGNESIUM: 1.8 mg/dL (ref 1.7–2.4)

## 2016-03-11 LAB — PHOSPHORUS: PHOSPHORUS: 4 mg/dL (ref 2.5–4.6)

## 2016-03-11 MED ORDER — PROCHLORPERAZINE EDISYLATE 5 MG/ML IJ SOLN
5.0000 mg | Freq: Four times a day (QID) | INTRAMUSCULAR | Status: DC | PRN
  Administered 2016-03-11: 5 mg via INTRAVENOUS
  Filled 2016-03-11 (×3): qty 1

## 2016-03-11 MED ORDER — LEVOFLOXACIN 750 MG PO TABS
750.0000 mg | ORAL_TABLET | ORAL | Status: AC
  Administered 2016-03-11 – 2016-03-13 (×2): 750 mg via ORAL
  Filled 2016-03-11 (×4): qty 1

## 2016-03-11 MED ORDER — TIZANIDINE HCL 2 MG PO TABS
2.0000 mg | ORAL_TABLET | Freq: Three times a day (TID) | ORAL | Status: DC
  Administered 2016-03-11 – 2016-03-15 (×12): 2 mg via ORAL
  Filled 2016-03-11 (×14): qty 1

## 2016-03-11 MED ORDER — SENNA 8.6 MG PO TABS
1.0000 | ORAL_TABLET | Freq: Every day | ORAL | Status: DC
  Administered 2016-03-11 – 2016-03-15 (×5): 8.6 mg via ORAL
  Filled 2016-03-11 (×5): qty 1

## 2016-03-11 MED ORDER — ATORVASTATIN CALCIUM 80 MG PO TABS
80.0000 mg | ORAL_TABLET | Freq: Every day | ORAL | Status: DC
  Administered 2016-03-12 – 2016-03-14 (×3): 80 mg via ORAL
  Filled 2016-03-11 (×3): qty 1

## 2016-03-11 MED ORDER — CLOPIDOGREL BISULFATE 75 MG PO TABS
75.0000 mg | ORAL_TABLET | Freq: Every day | ORAL | Status: DC
  Administered 2016-03-12 – 2016-03-15 (×4): 75 mg via ORAL
  Filled 2016-03-11 (×4): qty 1

## 2016-03-11 MED ORDER — METOCLOPRAMIDE HCL 5 MG/ML IJ SOLN
5.0000 mg | Freq: Two times a day (BID) | INTRAMUSCULAR | Status: DC
  Administered 2016-03-11 (×2): 5 mg via INTRAVENOUS
  Filled 2016-03-11 (×2): qty 2

## 2016-03-11 MED ORDER — LORATADINE 10 MG PO TABS: 10.0000 mg | ORAL_TABLET | Freq: Every morning | ORAL | Status: DC

## 2016-03-11 NOTE — Progress Notes (Signed)
PULMONARY / CRITICAL CARE MEDICINE   Name: [name] MRN: [id number] DOB: 06/11/1934    ADMISSION DATE:  03/18/2016 CONSULTATION DATE:  4/2  REFERRING MD:  [place]   CHIEF COMPLAINT:  Acute respiratory failure   HISTORY OF PRESENT ILLNESS:   This is a 82 year old pt w/ chronic respiratory failure f/b Wert for COPD. Also w hx significant CAD and ischemic CM. Called EMS w/ CC weakness, shortness of breath and CP. Sats were 88%. PT given 8 mg morphine and 100% NRB. She was obtunded by time she arrived in ER she was unresponsive and presumed hypercarbic. She was therefore intubated and PCCM asked to admit.   SUBJECTIVE / Interval events:  Poor sleep overnight Had a prolonged run of SVT vs VT that spontaneously converted.  Continued nausea and poor PO intake  VITAL SIGNS: BP 108/71 mmHg  Pulse 103  Temp(Src) 98.3 F (36.8 C) (Axillary)  Resp 17  Ht 5' (1.524 m)  Wt 60.6 kg (133 lb 9.6 oz)  BMI 26.09 kg/m2  SpO2 96%  HEMODYNAMICS:    VENTILATOR SETTINGS:    INTAKE / OUTPUT: I/O last 3 completed shifts: In: 2057.5 [P.O.:610; I.V.:1057.5; Blood:340; IV [id number] Out: 2985 [Urine:2170; Chest Tube:815]  PHYSICAL EXAMINATION: General:  82 year old female, ill appearing, in bed Neuro:  Awake, oriented, interacting HEENT:  OP clear Cardiovascular:  Rrr, 2/6 systolic M  Lungs:  Scattered B crackles, decreased at bases.  Abdomen:  Soft, not tender  Musculoskeletal:  Equal  Skin:  Intact   LABS:  BMET  Recent Labs Lab 03/09/16 1610 03/10/16 0335 03/11/16 0504  NA 139 137 138  K 4.1 4.4 4.3  CL 99* 99* 100*  CO2 29 28 28   BUN 20 22* 20  CREATININE 1.35* 1.52* 1.40*  GLUCOSE 111* 95 104*    Electrolytes  Recent Labs Lab 04/03/2016 1346 03/08/16 0439 03/09/16 0358 03/09/16 1610 03/10/16 0335 03/11/16 0504  CALCIUM  --  8.6* 8.4* 8.2* 8.2* 9.2  MG 2.3 2.0 1.8  --   --  1.8  PHOS 6.4* 3.3  --   --   --  4.0    CBC  Recent Labs Lab  03/09/16 0358 03/10/16 0335 03/11/16 0504  WBC 17.9* 15.5* 10.2  HGB 8.2* 6.7* 7.7*  HCT 28.0* 22.8* 25.3*  PLT 312 305 249    Coag's  Recent Labs Lab 03/09/2016 0950 04/02/2016 1346  APTT  --  107*  INR 1.18 1.31    Sepsis Markers  Recent Labs Lab 03/10/2016 1010 03/12/2016 1346 03/10/2016 1348 03/08/16 0439 03/09/16 0358  LATICACIDVEN 1.69  --  2.05*  --   --   PROCALCITON  --  <0.10  --  0.23 0.15    ABG  Recent Labs Lab 03/21/2016 1408 03/08/16 0325 03/09/16 0330  PHART 7.108* 7.422 7.552*  PCO2ART 52.5* 30.6* 35.8  PO2ART 87.0 149* 74.0*    Liver Enzymes  Recent Labs Lab 03/29/2016 0950  AST 40  ALT 13*  ALKPHOS 74  BILITOT 0.4  ALBUMIN 3.3*    Cardiac Enzymes  Recent Labs Lab 03/19/2016 1346 03/09/2016 2011 03/18/2016 2348  TROPONINI 0.26* 30.66* >65.00*    Glucose  Recent Labs Lab 03/10/16 0753 03/10/16 1248 03/10/16 1611 03/10/16 1937 03/11/16 0004 03/11/16 0414  GLUCAP 96 103* 94 113* 92 100*    Imaging Dg Chest Port 1 View  03/11/2016  CLINICAL DATA:  Recent pneumothorax.  Difficulty breathing EXAM: PORTABLE CHEST 1 VIEW COMPARISON:  March 10, 2016 FINDINGS: There is a chest tube on the right, unchanged in position. Central catheter tip is in the superior vena cava. No pneumothorax. There are pleural effusions bilaterally with interstitial edema and cardiomegaly. There is pulmonary venous hypertension. There is atelectatic change in the bases, stable. There is postoperative change involving the left humerus. Patient has undergone kyphoplasty procedures at several levels, unchanged. IMPRESSION: No pneumothorax. Evidence of a degree of congestive heart failure. Bibasilar atelectasis persists. Electronically Signed   By: Lowella Grip III M.D.   On: 03/11/2016 07:55     STUDIES:  Echo 4/3>>> severe AS, decrease in her LV fxn to [id number], grade 2 diastolic dysfxn  CULTURES: BCx2 4/2>>> Respiratory culture 4/2>>> pseudomonas  (pan-sens)  ANTIBIOTICS: levaquin 4/2>>> azactam 4/2>>> 4/5  SIGNIFICANT EVENTS:   LINES/TUBES: OETT 4/2>>> 4/4  DISCUSSION: Chronic resp failure d/t COPD and CAD / ischemic CM on 4 liters baseline.  Admitted with CP and acute resp failure, NSTEMI, B infiltrates consistent w acute CHF. Intubated and treated medically for her MI + empiric abx in case of possible PNA  ASSESSMENT / PLAN:  PULMONARY A: Acute on chronic respiratory failure in setting of B infiltrates, MSO4  Bibasilar atx vs infiltrates + effusions R > L.  R/o PNA Chest pain  R iatrogenic PTX P:   Push pulmonary hygiene See ID and CV sections Would like to pull R chest tube.  High output likely due to her CHF, unclear whether this will decrease, may have to consider pulling tube with higher output, possibly place palliative pleurex. Drainage is blood-tinged, heparin stopped 4/5  CARDIOVASCULAR A:  HTN  Severe CAD including L main disease.  Severe AS NSTEMI SVT (vs VT) 4/2 pm, again 4/6 am  Shock, resolved P:  plavix + [id number] Heparin gtt stopped 4/5 Scheduled metoprolol Amiodarone added 4/2, stopped 4/4 Phenylephrine off Appreciate Cardiology input, note that she was not a CABG candidate when assessed following L cath 6/16. PTCI would be challenging and high risk as well. Also not an AVR candidate. Recommendation is for medical management, palliation.  Based on severity of her disease nad predicted course will ask Palliative Care to see her, assist [place] with management plans going forward  RENAL A:   Acute renal failure, slightly worse 4/5 Hyperchloremia  Metabolic acidosis/NAG Hypokalemia  P:   Decreased lasix 4/5 to 40 PO bid, remains net-negative Hold any further toradol or NSAIDS as able Strict I&O F/u chemistry   GASTROINTESTINAL A:   Nausea P:   PPI for SUP Heart healthy diet zofran ineffective, will try compazine prn  HEMATOLOGIC A:   Anemia, received 1u PRBC 4/4 pm P:  Trend  h&h Transfuse for hgb<7 [place] heparin   INFECTIOUS A:   Possible CAP, suspect acute CHF + effusions. Note Pct initially negative > 0.23 but sputum > pseudomonas (colonizer vs active infxn).  - Pneumococcal Ag negative - Flu A&B negative P:   Sputum culture >> pseudomonas >> pan-sens D/c aztreonam, continue levaquin now day 5 of 7 days total abx. Change to PO when able  ENDOCRINE A:   DM w/ hyperglycemia  P:   ssi protocol   NEUROLOGIC A:   Acute encephalopathy, resolved Sedation for MV Pain control P:   RASS goal: 0 PAD protocol  Restarted her home ativan at half dose Ultram ordered  GLOBAL:  I discussed active problems, status and prognosis with pt 4/5. She understands current issues. She confirms with me that she would not want intubation or  ACLS under any circumstances. I will place DNR orders on the chart. Will ask Palliative Care to see her 4/6  Will assess for transfer to SDU if we can get pressors weaned to off today.    FAMILY  - Updates: no family present 74/5 or 4/6  - Inter-disciplinary family meet or Palliative Care meeting due by:  4/9   Baltazar Apo, MD, PhD 03/11/2016, 9:04 AM [facility] [phone] or if no answer [phone]

## 2016-03-11 NOTE — Consult Note (Signed)
Consultation Note Date: 03/11/2016   Patient Name: [name]  DOB: 1934/11/16  MRN: [id number]  Age / Sex: 82 y.o., female  PCP: [name], MD Referring Physician: Rush Farmer, MD  Reason for Consultation: Disposition, Establishing goals of care, Non pain symptom management and Pain control    Clinical Assessment/Narrative: 82 yo woman with end stage COPD, advanced CAD NSTEMI not a surgical candidate, prior history of adenocarcinoma of the esophagus s/p esophgogastrectomy who has a high symptom burden that includes baseline dyspnea, air hunger, chronic severe nausea, weight loss, poor functional status, generalized pain and fatigue. She has ,ultiple medication allergies and also has a poor support system. She lives in her home with her son who is an alcoholic and provides little support. She is independent in her decision aking and has decided against any additional aggressive interventions and is a DNR. She tells me each day is a struggle just to care for herself and that she cannot do it anymore. She also tells me she is terrified of dying and the thought of this virtually paralyzes her but that she is moving towards more acceptance and does here from her other doctors how serious her condition is right now. Her main stated goal is to be able to rest/sleep and get her nausea under control.   She is s/p removal of MV and chest tube- her chest tube site has significant output and concern is that she may reaccumulate that effusion. She also has chronic angina chest pain and several runs of sustained VT this admission.   Contacts/Participants in [place] only Primary Decision Maker: self- we discussed who would make decisions on her behalf if she were not able to do that-she is going to think about this,  Relationship to Patient self HCPOA: no    SUMMARY OF RECOMMENDATIONS  Code Status/Advance Care  Planning: DNR  Other Directives:None  Symptom Management:   Nausea, chronic severe, dramaine works at home. Her cancer history and post surgical "gut" issues have been a problem since her cancer surgery and treatment-in remission presumably but has not had follow-up or imaging of abdomen in >1 year. For now will start her on scheduled low dose reglan, prn compazine ok,- doubt zofran will help much but may need more than one drug to target nausea triggers.  Pain: Patient requests Tizanadine Zanaflex-she takes this at home   Insomnia/Anxiety: continue PRN Ativan and give PM scheduled dose of 0.'5mg'$   Dyspnea Air hunger- currently she is refusing opiates due to history of itching- I will continue to discuss this with her- there may be a role for a Pleurx catheter for the effusion- will defer to pulmonary.   Palliative Prophylaxis:   Aspiration, Bowel Regimen, Delirium Protocol, Eye Care, Frequent Pain Assessment, Oral Care, Palliative Wound Care and Turn Reposition  Additional Recommendations (Limitations, Scope, Preferences):  Avoid Hospitalization, Minimize Medications and No Glucose Monitoring  Focus on comfort and treating reversible disease  Psycho-social/Spiritual:  Support System: Poor Desire for further Chaplaincy support:yes Additional Recommendations: Caregiving  Support/Resources and Medicaid/Financial Assistance  Prognosis: < 3 months  Discharge Planning: [facility] for rehab with Palliative care service follow-up- she does not have medicaid- may need help with this- she is going to need hospice care -she would qualify now but would not be able to afford SNF costs+hospice-palliative can follow under rehab benefit. She tells me she will only go to [facility] in [place]. We have a few more hurdles to get over before moving  in that direction- she is really fragile and may even have an event in the hospital that causes her death- she may also decline and be  more appropriate for a Hospice House-will see how she does over next 24-48 hours.   Chief Complaint/ Primary Diagnoses: Present on Admission:  . Respiratory failure ([place]) . Acute on chronic respiratory failure ([place]) . Acute on chronic systolic (congestive) heart failure ([place]) . NSTEMI (non-ST elevated myocardial infarction) ([place])  I have reviewed the medical record, interviewed the patient and family, and examined the patient. The following aspects are pertinent.  Past Medical History  Diagnosis Date  . Fibromyalgia   . Skin lesion     chronic calcific cutaneous lesions  . HX of multiple bleeding ulcers 09/01/2011  . Hypertension   . Emphysema   . Dizziness     d/t crystals in ears that "roll around"and can't get them out  . Osteoporosis   . Scoliosis   . Psoriasis   . H/O hiatal hernia   . GERD (gastroesophageal reflux disease)   . Gastric ulcer   . Diverticulosis   . History of colonic polyps   . Shingles   . Vitamin B deficiency   . COPD (chronic obstructive pulmonary disease) ([place])   . On home oxygen therapy     "2L;  24/7" (05/14/2015)  . Left humeral fracture 04/26/2015  . Scoliosis   . Complication of anesthesia     shortness of breath and pain with gas   . Pneumonia 2015 X 3  . Chronic bronchitis ([place])     "I about keep it"  . History of blood transfusion     "related to my anemia"  . Osteoarthritis   . Arthritis     "qwhere" (05/14/2015)  . Chronic upper back pain   . Kidney stone 25+yrs ago    "passed them"  . Adenocarcinoma of gastroesophageal junction ([place])   . Pernicious anemia   . Iron deficiency anemia   . Anemia, B12 deficiency     "get shots once/month" (05/14/2015)  . Chronic combined systolic (congestive) and diastolic (congestive) heart failure ([place])     a. 2D echo 05/12/15: EF 40-45, diffuse HK, grade 2 DD, moderate AS, mild MR, mod LAE, PASP 62mHg.  .Marland KitchenCoronary artery disease     a. Severe 3 vessel ASCAD felt to be a poor candidate for CABG given  her advanced age, severe COPD, chronic steroid-induced use and poor mobility. Not a good candidate for PCI either.  . Aortic stenosis     a. Mod by echo 05/2015.  .Marland KitchenHyperlipidemia   . Pulmonary hypertension ([place]    Social History   Social History  . Marital Status: Widowed    Spouse Name: N/A  . Number of Children: 5  . Years of Education: 12   Occupational History  . disabled    Social History Main Topics  . Smoking status: Former Smoker -- 3.00 packs/day for 45 years    Types: Cigarettes    Quit date: 12/07/1991  . Smokeless tobacco: Never Used  . Alcohol Use: No  . Drug Use: No  . Sexual Activity: No   Other Topics Concern  . 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 Cancer Maternal Grandmother     gynecologic  . Cancer Maternal Aunt  gynecologic  . Breast cancer Paternal Aunt   . Breast cancer Paternal Aunt   . Breast cancer Paternal Aunt   . Anesthesia problems Neg Hx   . Hypotension Neg Hx   . Malignant hyperthermia Neg Hx   . Pseudochol deficiency Neg Hx   . Heart attack Neg Hx    Scheduled Meds: . antiseptic oral rinse  7 mL Mouth Rinse QID  . arformoterol  15 mcg Nebulization BID  . aspirin EC  81 mg Oral Daily  . atorvastatin  80 mg Per Tube q1800  . budesonide (PULMICORT) nebulizer solution  0.5 mg Nebulization BID  . chlorhexidine gluconate (SAGE KIT)  15 mL Mouth Rinse BID  . clopidogrel  75 mg Per Tube Daily  . insulin aspart  0-15 Units Subcutaneous 6 times per day  . levofloxacin (LEVAQUIN) IV  750 mg Intravenous Q48H  . loratadine  10 mg Per Tube q morning - 10a  . metoCLOPramide (REGLAN) injection  5 mg Intravenous Q12H  . metoprolol tartrate  12.5 mg Oral BID  . pantoprazole (PROTONIX) IV  40 mg Intravenous Daily  . senna  1 tablet Per Tube QHS  . tiZANidine  2 mg Oral TID   Continuous  Infusions:  PRN Meds:.sodium chloride, acetaminophen, HYDROcodone-acetaminophen, hydroxypropyl methylcellulose / hypromellose, LORazepam, ondansetron (ZOFRAN) IV, prochlorperazine, sodium chloride Medications Prior to Admission:  Prior to Admission medications   Medication Sig Start Date End Date Taking? Authorizing Provider  amoxicillin (AMOXIL) 500 MG capsule Take 2,000 mg by mouth See admin instructions. Take 4 capsules (2000 mg) by mouth one hour prior to dental appointment   Yes Historical Provider, MD  atorvastatin (LIPITOR) 80 MG tablet Take 1 tablet (80 mg total) by mouth daily at 6 PM. 05/20/15  Yes Azalee Course, PA  chlorhexidine (PERIDEX) 0.12 % solution Use as directed in the mouth or throat 2 (two) times daily. Swish and spit twice daily 03/04/16  Yes Historical Provider, MD  clopidogrel (PLAVIX) 75 MG tablet Take 1 tablet (75 mg total) by mouth daily. 05/21/15  Yes Azalee Course, PA  DULoxetine (CYMBALTA) 30 MG capsule Take 30 mg by mouth daily. 02/16/16  Yes Historical Provider, MD  ergocalciferol (VITAMIN D2) 50000 UNITS capsule Take 50,000 Units by mouth every 14 (fourteen) days.    Yes Historical Provider, MD  fluticasone furoate-vilanterol (BREO ELLIPTA) 200-25 MCG/INH AEPB Inhale 1 puff into the lungs daily.   Yes Historical Provider, MD  furosemide (LASIX) 40 MG tablet Take 40 mg by mouth 2 (two) times daily.  12/22/15  Yes Historical Provider, MD  HYDROcodone-acetaminophen (NORCO/VICODIN) 5-325 MG tablet Take 1 tablet by mouth 2 (two) times daily as needed (pain).  02/16/16  Yes Historical Provider, MD  ipratropium-albuterol (DUONEB) 0.5-2.5 (3) MG/3ML SOLN Take 3 mLs by nebulization every 6 (six) hours. Patient taking differently: Take 3 mLs by nebulization 4 (four) times daily as needed (shortness of breath).  04/29/15  Yes Shanker Levora Dredge, MD  isosorbide mononitrate (IMDUR) 60 MG 24 hr tablet Take 1 tablet (60 mg total) by mouth 2 (two) times daily. 11/11/15  Yes Quintella Reichert, MD    LORazepam (ATIVAN) 0.5 MG tablet Take 1 tablet (0.5 mg total) by mouth every 8 (eight) hours as needed for anxiety. Patient taking differently: Take 0.25-0.5 mg by mouth every 12 (twelve) hours as needed for anxiety.  05/20/15  Yes Azalee Course, PA  losartan (COZAAR) 100 MG tablet Take 100 mg by mouth daily.   Yes Historical Provider, MD  nitroGLYCERIN (NITROSTAT) 0.4 MG SL tablet Place 1 tablet (0.4 mg total) under the tongue every 5 (five) minutes as needed for chest pain. 10/27/15  Yes Dayna N Dunn, PA-C  penicillin v potassium (VEETID) 500 MG tablet Take 500 mg by mouth 4 (four) times daily. 7 day course filled 03/01/16 03/01/16  Yes Historical Provider, MD  predniSONE (DELTASONE) 1 MG tablet Take 1-2 mg by mouth daily.  02/10/16  Yes Historical Provider, MD  tiZANidine (ZANAFLEX) 4 MG tablet Take 0.5-1 tablets (2-4 mg total) by mouth every 8 (eight) hours as needed for muscle spasms. Patient taking differently: Take 2 mg by mouth 4 (four) times daily as needed for muscle spasms.  04/29/15  Yes Shanker Kristeen Mans, MD  acetaminophen (TYLENOL) 500 MG tablet Take 500 mg by mouth daily.    Historical Provider, MD  albuterol (PROVENTIL HFA;VENTOLIN HFA) 108 (90 BASE) MCG/ACT inhaler Inhale 2 puffs into the lungs every 4 (four) hours as needed for wheezing (wheezing). 04/29/15   Shanker Kristeen Mans, MD  albuterol (PROVENTIL) (2.5 MG/3ML) 0.083% nebulizer solution Take 3 mLs (2.5 mg total) by nebulization every 2 (two) hours as needed for wheezing or shortness of breath. 04/29/15   Jonetta Osgood, MD  aspirin EC 81 MG tablet Take 81 mg by mouth every morning.     Historical Provider, MD  atenolol (TENORMIN) 50 MG tablet Take 50 mg by mouth 2 (two) times daily.    Historical Provider, MD  cyanocobalamin (,VITAMIN B-12,) 1000 MCG/ML injection Inject 1,000 mcg into the muscle every 30 (thirty) days.     Historical Provider, MD  esomeprazole (NEXIUM) 20 MG capsule Take 20 mg by mouth daily before breakfast.      Historical Provider, MD  guaiFENesin (MUCINEX) 600 MG 12 hr tablet Take 1 tablet (600 mg total) by mouth 2 (two) times daily. 04/29/15   Shanker Kristeen Mans, MD  hydroxypropyl methylcellulose (ISOPTO TEARS) 2.5 % ophthalmic solution Place 1 drop into both eyes 3 (three) times daily as needed for dry eyes.    Historical Provider, MD  ibuprofen (ADVIL,MOTRIN) 200 MG tablet Take 400 mg by mouth daily as needed (pain).     Historical Provider, MD  loratadine (CLARITIN) 10 MG tablet Take 10 mg by mouth every morning.     Historical Provider, MD  losartan (COZAAR) 50 MG tablet Take 1 tablet (50 mg total) by mouth daily. Patient not taking: Reported on 03/31/2016 10/27/15   Dayna N Dunn, PA-C  OXYGEN Inhale 2 L into the lungs. As needed per pt    Historical Provider, MD  potassium chloride SA (K-DUR,KLOR-CON) 20 MEQ tablet Take 20 mEq by mouth 2 (two) times daily.    Historical Provider, MD  senna (SENOKOT) 8.6 MG TABS tablet Take 1 tablet (8.6 mg total) by mouth at bedtime. 04/29/15   Shanker Kristeen Mans, MD  sodium chloride (OCEAN) 0.65 % SOLN nasal spray Place 1 spray into both nostrils as needed for congestion. 05/02/14   Modena Jansky, MD  [facility] JOH[facility] WORT PO Take 1 capsule by mouth 2 (two) times daily.    Historical Provider, MD   Allergies  Allergen Reactions  . Keflex [Cephalexin] Itching    "severe itching"  . Nulecit [Na Ferric Gluc Cplx In Sucrose] Diarrhea, Nausea Only and Other (See Comments)      drop in BP  . Biaxin [Clarithromycin] Itching  . Spiriva Handihaler [Tiotropium Bromide Monohydrate] Other (See Comments)    constipation  . Tramadol Itching  Skin burning and itching  . Adhesive [Tape] Itching  . Clarithromycin Itching  . Darvon Itching  . Epinephrine Itching  . Fentanyl Itching  . Morphine And Related Itching  . Oxycodone Itching  . Sulfa Antibiotics Itching  . Vicodin [Hydrocodone-Acetaminophen] Itching    Can take with Benadryl    Review of Systems  Physical  Exam  Vital Signs: BP 108/67 mmHg  Pulse 105  Temp(Src) 98.3 F (36.8 C) (Axillary)  Resp 14  Ht 5' (1.524 m)  Wt 60.6 kg (133 lb 9.6 oz)  BMI 26.09 kg/m2  SpO2 94%  SpO2: SpO2: 94 % O2 Device:SpO2: 94 % O2 Flow Rate: .O2 Flow Rate (L/min): 2 L/min  IO: Intake/output summary:  Intake/Output Summary (Last 24 hours) at 03/11/16 1147 Last data filed at 03/11/16 0900  Gross per 24 hour  Intake 328.13 ml  Output   1990 ml  Net -1661.87 ml    LBM: Last BM Date: 03/14/16 Baseline Weight: Weight: 58.4 kg (128 lb 12 oz) (from march in epic) Most recent weight: Weight: 60.6 kg (133 lb 9.6 oz)      Palliative Assessment/Data:  Flowsheet Rows        Most Recent Value   Intake Tab    Referral Department  Critical care   Unit at Time of Referral  ICU   Palliative Care Primary Diagnosis  Cardiac   Date Notified  03/11/16   Palliative Care Type  New Palliative care   Reason for referral  Clarify Goals of Care, Counsel Regarding Hospice   Date of Admission  03/10/2016   Date first seen by Palliative Care  03/11/16   # of days IP prior to Palliative referral  4   Clinical Assessment    Palliative Performance Scale Score  20%   Pain Max last 24 hours  3   Pain Min Last 24 hours  3   Dyspnea Max Last 24 Hours  8   Dyspnea Min Last 24 hours  3   Nausea Max Last 24 Hours  10   Nausea Min Last 24 Hours  10   Anxiety Max Last 24 Hours  5   Anxiety Min Last 24 Hours  5   Psychosocial & Spiritual Assessment    Palliative Care Outcomes    Patient/Family meeting held?  No   Patient/Family wishes: Interventions discontinued/not started   Mechanical Ventilation, BiPAP      Additional Data Reviewed:  CBC:    Component Value Date/Time   WBC 10.2 03/11/2016 0504   WBC 8.0 11/21/2012 1617   HGB 7.7* 03/11/2016 0504   HGB 11.7 11/21/2012 1617   HCT 25.3* 03/11/2016 0504   HCT 35.2 11/21/2012 1617   PLT 249 03/11/2016 0504   PLT 313 11/21/2012 1617   MCV 88.2 03/11/2016 0504   MCV  86.2 11/21/2012 1617   NEUTROABS 10.9* 03/27/2016 0950   NEUTROABS 5.3 11/21/2012 1617   LYMPHSABS 3.8 03/29/2016 0950   LYMPHSABS 1.6 11/21/2012 1617   MONOABS 1.4* 03/27/2016 0950   MONOABS 0.9 11/21/2012 1617   EOSABS 0.4 03/15/2016 0950   EOSABS 0.2 11/21/2012 1617   BASOSABS 0.1 03/30/2016 0950   BASOSABS 0.1 11/21/2012 1617   Comprehensive Metabolic Panel:    Component Value Date/Time   NA 138 03/11/2016 0504   NA 138 01/23/2013 1342   K 4.3 03/11/2016 0504   K 4.1 01/23/2013 1342   CL 100* 03/11/2016 0504   CL 106 01/23/2013 1342   CO2 28 03/11/2016  0504   CO2 24 01/23/2013 1342   BUN 20 03/11/2016 0504   BUN 13.8 01/23/2013 1342   CREATININE 1.40* 03/11/2016 0504   CREATININE 1.79* 02/18/2016 1023   CREATININE 0.8 01/23/2013 1342   GLUCOSE 104* 03/11/2016 0504   GLUCOSE 110* 01/23/2013 1342   CALCIUM 9.2 03/11/2016 0504   CALCIUM 9.5 01/23/2013 1342   AST 40 03/09/2016 0950   AST 11 01/23/2013 1342   ALT 13* 03/19/2016 0950   ALT <6 Repeated and Verified 01/23/2013 1342   ALKPHOS 74 03/12/2016 0950   ALKPHOS 91 01/23/2013 1342   BILITOT 0.4 03/25/2016 0950   BILITOT 0.23 01/23/2013 1342   PROT 6.9 03/11/2016 0950   PROT 7.0 01/23/2013 1342   ALBUMIN 3.3* 03/12/2016 0950   ALBUMIN 3.2* 01/23/2013 1342     Time In: 1030AM Time Out: 11:20AM Time Total: 50 minutes Greater than 50%  of this time was spent counseling and coordinating care related to the above assessment and plan.  Signed by: Roma Schanz, DO  03/11/2016, 11:47 AM  Please contact Palliative Medicine Team phone at [phone] for questions and concerns.

## 2016-03-11 NOTE — Progress Notes (Signed)
   03/11/16 1400  Clinical Encounter Type  Visited With Patient  Visit Type Spiritual support  Referral From Palliative care team  Spiritual Encounters  Spiritual Needs Emotional  Stress Factors  Patient Stress Factors Family relationships;Exhausted;Health changes;Major life changes  Stopped by to visit patient and spoke to her, but she was very tired and sleepy and said that she would prefer I come back another time. According to nurse, she is being transferred to [facility].

## 2016-03-11 NOTE — Progress Notes (Signed)
TELEMETRY: Reviewed telemetry pt in NSR. Had a long run of VT at 6:44 this am lasting 1.5 minutes. Resolved spontaneously.Danley Danker Vitals:   03/11/16 0600 03/11/16 0700 03/11/16 0759 03/11/16 0800  BP: 89/61 117/79 108/71 108/71  Pulse: 88 114 105 103  Temp:    98.3 F (36.8 C)  TempSrc:    Axillary  Resp: '13 17 16 17  '$ Height:      Weight:      SpO2: 99% 94% 95% 96%    Intake/Output Summary (Last 24 hours) at 03/11/16 0924 Last data filed at 03/11/16 0800  Gross per 24 hour  Intake 398.38 ml  Output   2215 ml  Net -1816.62 ml   Filed Weights   03/08/16 0454 03/09/16 0500 03/10/16 0451  Weight: 59.5 kg (131 lb 2.8 oz) 59.4 kg (130 lb 15.3 oz) 60.6 kg (133 lb 9.6 oz)    Subjective Complains of persistent nausea.  Notes some pain at chest tube site.  Expresses desire to never go back on vent or have a chest tube. Now a DNR.  Marland Kitchen antiseptic oral rinse  7 mL Mouth Rinse QID  . arformoterol  15 mcg Nebulization BID  . aspirin EC  81 mg Oral Daily  . atorvastatin  80 mg Per Tube q1800  . budesonide (PULMICORT) nebulizer solution  0.5 mg Nebulization BID  . chlorhexidine gluconate (SAGE KIT)  15 mL Mouth Rinse BID  . clopidogrel  75 mg Per Tube Daily  . insulin aspart  0-15 Units Subcutaneous 6 times per day  . levofloxacin (LEVAQUIN) IV  750 mg Intravenous Q48H  . loratadine  10 mg Per Tube q morning - 10a  . metoprolol tartrate  12.5 mg Oral BID  . pantoprazole (PROTONIX) IV  40 mg Intravenous Daily  . senna  1 tablet Per Tube QHS      LABS: Basic Metabolic Panel:  Recent Labs  03/09/16 0358  03/10/16 0335 03/11/16 0504  NA 140  < > 137 138  K 2.7*  < > 4.4 4.3  CL 98*  < > 99* 100*  CO2 30  < > 28 28  GLUCOSE 110*  < > 95 104*  BUN 21*  < > 22* 20  CREATININE 1.39*  < > 1.52* 1.40*  CALCIUM 8.4*  < > 8.2* 9.2  MG 1.8  --   --  1.8  PHOS  --   --   --  4.0  < > = values in this interval not displayed. Liver Function Tests: No results for input(s): AST,  ALT, ALKPHOS, BILITOT, PROT, ALBUMIN in the last 72 hours. No results for input(s): LIPASE, AMYLASE in the last 72 hours. CBC:  Recent Labs  03/10/16 0335 03/11/16 0504  WBC 15.5* 10.2  HGB 6.7* 7.7*  HCT 22.8* 25.3*  MCV 89.1 88.2  PLT 305 249   Cardiac Enzymes: No results for input(s): CKTOTAL, CKMB, CKMBINDEX, TROPONINI in the last 72 hours. BNP: No results for input(s): PROBNP in the last 72 hours. D-Dimer: No results for input(s): DDIMER in the last 72 hours. Hemoglobin A1C: No results for input(s): HGBA1C in the last 72 hours. Fasting Lipid Panel: No results for input(s): CHOL, HDL, LDLCALC, TRIG, CHOLHDL, LDLDIRECT in the last 72 hours. Thyroid Function Tests: No results for input(s): TSH, T4TOTAL, T3FREE, THYROIDAB in the last 72 hours.  Invalid input(s): FREET3   Radiology/Studies:  Dg Chest Port 1 View  03/11/2016  CLINICAL DATA:  Recent pneumothorax.  Difficulty breathing EXAM: PORTABLE CHEST 1 VIEW COMPARISON:  March 10, 2016 FINDINGS: There is a chest tube on the right, unchanged in position. Central catheter tip is in the superior vena cava. No pneumothorax. There are pleural effusions bilaterally with interstitial edema and cardiomegaly. There is pulmonary venous hypertension. There is atelectatic change in the bases, stable. There is postoperative change involving the left humerus. Patient has undergone kyphoplasty procedures at several levels, unchanged. IMPRESSION: No pneumothorax. Evidence of a degree of congestive heart failure. Bibasilar atelectasis persists. Electronically Signed   By: Bretta Bang III M.D.   On: 03/11/2016 07:55   Dg Chest Port 1 View  03/10/2016  CLINICAL DATA:  F/U Respiratory Failure EXAM: PORTABLE CHEST - 1 VIEW COMPARISON:  the previous day's study FINDINGS: Right chest tube stable in position. No pneumothorax. Probable bilateral pleural effusions as before. Bibasilar consolidation/ atelectasis left greater than right stable.  Endotracheal tube and nasogastric tube have been removed. Heart size upper limits normal for technique. Atheromatous ectatic thoracic aorta. Changes of kyphoplasty/ vertebroplasty at 3 mid thoracic levels. Mild thoracolumbar dextroscoliosis. Surgical clips at the thoracic inlet on the left. Orthopedic hardware in the left humeral head partially visualized. IMPRESSION: 1. Interval extubation. 2. Stable right chest tube with no pneumothorax. 3. Bilateral pleural effusions and bibasilar atelectasis/consolidation, stable. Electronically Signed   By: Corlis Leak M.D.   On: 03/10/2016 07:37   Ecg initially showed global ST depression and ST elevation in AVr. Repeat Ecg showed improvement in ST depression.  Echo: Study Conclusions  - Left ventricle: The cavity size was normal. Wall thickness was  normal. Systolic function was moderately to severely reduced. The  estimated ejection fraction was in the range of 30% to 35%.  Diffuse hypokinesis. Features are consistent with a pseudonormal  left ventricular filling pattern, with concomitant abnormal  relaxation and increased filling pressure (grade 2 diastolic  dysfunction). Doppler parameters are consistent with high  ventricular filling pressure. - Aortic valve: Valve mobility was restricted. There was severe  stenosis. There was trivial regurgitation. Valve area (VTI): 0.93  cm^2. Valve area (Vmax): 0.91 cm^2. Valve area (Vmean): 0.78  cm^2. - Mitral valve: Calcified annulus. Mildly thickened leaflets .  There was mild regurgitation. - Left atrium: The atrium was moderately dilated. - Pulmonary arteries: Systolic pressure was mildly increased. PA  peak pressure: 41 mm Hg (S).  Impressions:  - Moderate to severe global reduction in LV function; grade 2  diastolic dysfunction with elevated LV filling pressure; severely  calcified aortic valve with reduced cusp excursion; severe AS by  continuity equation (mean gradient 14 mmHg;  AVA 0.9 cm2); trace  AI; MAC with mild MR; moderate LAE; trace TR with mildly elevated  pulmonary pressure; consider dobutamine echo to further assess  severity of AS.  PHYSICAL EXAM General: Chronically ill, elderly, frail WF Head: Normocephalic, atraumatic, sclera non-icteric, oropharynx is clear Neck: Negative for carotid bruits. JVD not elevated. No adenopathy Lungs: few rales. No edema. Heart: RRR with grade 2/6 systolic murmur RUSB. Abdomen: Soft, non-tender, non-distended with normoactive bowel sounds. No hepatomegaly. No rebound/guarding. No obvious abdominal masses. Msk:  Strength and tone appears normal for age. Extremities: No clubbing, cyanosis or edema.  Distal pedal pulses are 2+ and equal bilaterally. Neuro: awake, alert. Oriented.  Responds appropriately.   ASSESSMENT AND PLAN: 1. Acute on chronic respiratory failure most consistent with acute pulmonary edema +/- PNA in setting of baseline severe COPD. S/p right chest tube now out.   On antibiotics.  Tracheal aspirate + for pseudomonas aeruginosa. Not able to push diuretics due to hypotension. DNR.   2. NSTEMI. Troponin >65. Ecg findings c/w global ischemia in patient with known severe left main and proximal RCA disease. Seen by Dr. Roxan Hockey in June 2016 following cardiac cath. Not felt to be a surgical candidate due to severe COPD, anemia, and frailty. She has been on good medical therapy. Her quality of life has been very poor. On home oxygen and really unable to leave the house. Functional status is very poor. I reviewed films with Dr. Angelena Form. She is not a candidate  for stenting. With her coronary status alone it would be very high risk and given comorbid conditions of severe COPD, Severe AS, and severe LV dysfunction she would not survive the procedure and I think it is unlikely to improve her quality of life.  I would recommend palliative care. On ASA and Plavix. On very low dose metoprolol. Unable to tolerate  additional antianginal therapy due to low BP.   3. Severe COPD.   4. Acute on chronic Combined systolic and diastolic CHF. EF dropped to 30-35% with recent MI.   Additional medication limited by low BP.  5. NSVT and SVT due to ischemia, infarct. Off amiodarone now. Continue low dose metoprolol. Potassium repleted.   6. Acute on chronic RF.   7. Hyperlipidemia on statin.   8. Hypokalemia- repleted  9. Right pneumothorax. CT in place  10. Severe AS  Plan: Palliative care consult. Little else to offer that will treat her underlying illness. Prognosis is very poor. Focus on comfort care.  Present on Admission:  . Respiratory failure ([place]) . Acute on chronic respiratory failure ([place]) . Acute on chronic systolic (congestive) heart failure ([place]) . NSTEMI (non-ST elevated myocardial infarction) ([place])  Signed, [name], [place] 03/11/2016 9:24 AM

## 2016-03-11 NOTE — Clinical Documentation Improvement (Signed)
Cardiology Critical Care  Can the diagnosis of 'Shock resolved" be further specified? Please document response in next progress note. Thank you!   Shock, including Type:  Septic, Cardiogenic, Hypovolemic   Other Condition  Clinically Undetermined  Document any associated diagnoses/conditions.  Supporting Information:  Please exercise your independent, professional judgment when responding. A specific answer is not anticipated or expected.  Thank You,  Zoila Shutter RN, BSN, [place] [phone]; Cell: [phone]

## 2016-03-11 NOTE — Progress Notes (Signed)
PT Cancellation Note  Patient Details Name: [name] MRN: [id number] DOB: 03/11/1934   Cancelled Treatment:    Reason Eval/Treat Not Completed: Patient declined, no reason specified Pt declines participating in therapy services due to wanting to eat something and feeling nauseous. Will follow up.   [name] 03/11/2016, 10:56 AM Wray Kearns, PT, DPT [phone]

## 2016-03-11 NOTE — Progress Notes (Signed)
eLink Physician-Brief Progress Note Patient Name: [name] DOB: 1934/02/17 MRN: [id number]   Date of Service  03/11/2016  HPI/Events of Note  Patient with 40min run of tachyarrhythmia, ?SVT (HR 180s), spontaneously resolved. Mild CP and SOB  eICU Interventions  EKG Check Mg, Phos     Intervention Category Intermediate Interventions: Arrhythmia - evaluation and management  Stuart Mirabile 03/11/2016, 6:49 AM

## 2016-03-12 ENCOUNTER — Inpatient Hospital Stay (HOSPITAL_COMMUNITY): Payer: Medicare Other

## 2016-03-12 DIAGNOSIS — Z515 Encounter for palliative care: Secondary | ICD-10-CM | POA: Diagnosis present

## 2016-03-12 LAB — MAGNESIUM: Magnesium: 2 mg/dL (ref 1.7–2.4)

## 2016-03-12 LAB — BASIC METABOLIC PANEL
Anion gap: 10 (ref 5–15)
BUN: 20 mg/dL (ref 6–20)
CHLORIDE: 100 mmol/L — AB (ref 101–111)
CO2: 26 mmol/L (ref 22–32)
Calcium: 8.9 mg/dL (ref 8.9–10.3)
Creatinine, Ser: 1.45 mg/dL — ABNORMAL HIGH (ref 0.44–1.00)
GFR calc non Af Amer: 33 mL/min — ABNORMAL LOW (ref >60)
GFR, EST AFRICAN AMERICAN: 39 mL/min — AB (ref >60)
Glucose, Bld: 83 mg/dL (ref 65–99)
POTASSIUM: 4.6 mmol/L (ref 3.5–5.1)
SODIUM: 136 mmol/L (ref 135–145)

## 2016-03-12 LAB — CULTURE, BLOOD (ROUTINE X 2)
Culture: NO GROWTH
Culture: NO GROWTH

## 2016-03-12 LAB — PHOSPHORUS: PHOSPHORUS: 4.8 mg/dL — AB (ref 2.5–4.6)

## 2016-03-12 MED ORDER — LORAZEPAM 0.5 MG PO TABS
0.5000 mg | ORAL_TABLET | ORAL | Status: DC | PRN
  Administered 2016-03-13 – 2016-03-15 (×2): 0.5 mg via ORAL
  Filled 2016-03-12 (×2): qty 1

## 2016-03-12 MED ORDER — PANTOPRAZOLE SODIUM 40 MG PO TBEC
40.0000 mg | DELAYED_RELEASE_TABLET | Freq: Every day | ORAL | Status: DC
  Administered 2016-03-13 – 2016-03-14 (×2): 40 mg via ORAL
  Filled 2016-03-12 (×2): qty 1

## 2016-03-12 MED ORDER — PROCHLORPERAZINE EDISYLATE 5 MG/ML IJ SOLN
10.0000 mg | Freq: Four times a day (QID) | INTRAMUSCULAR | Status: DC | PRN
  Filled 2016-03-12: qty 2

## 2016-03-12 MED ORDER — METOCLOPRAMIDE HCL 5 MG/ML IJ SOLN
5.0000 mg | Freq: Three times a day (TID) | INTRAMUSCULAR | Status: DC
  Administered 2016-03-12 – 2016-03-14 (×8): 5 mg via INTRAVENOUS
  Filled 2016-03-12 (×8): qty 2

## 2016-03-12 MED ORDER — ONDANSETRON HCL 4 MG PO TABS
4.0000 mg | ORAL_TABLET | Freq: Three times a day (TID) | ORAL | Status: DC
  Administered 2016-03-12 (×2): 4 mg via ORAL
  Filled 2016-03-12 (×2): qty 1

## 2016-03-12 MED ORDER — HYDROMORPHONE HCL 1 MG/ML IJ SOLN
0.5000 mg | INTRAMUSCULAR | Status: DC | PRN
  Administered 2016-03-12 – 2016-03-14 (×10): 0.5 mg via INTRAVENOUS
  Filled 2016-03-12 (×10): qty 1

## 2016-03-12 MED ORDER — CETYLPYRIDINIUM CHLORIDE 0.05 % MT LIQD
7.0000 mL | Freq: Two times a day (BID) | OROMUCOSAL | Status: DC
  Administered 2016-03-12 – 2016-03-15 (×6): 7 mL via OROMUCOSAL

## 2016-03-12 MED ORDER — NITROGLYCERIN 0.4 MG SL SUBL
0.4000 mg | SUBLINGUAL_TABLET | SUBLINGUAL | Status: DC | PRN
  Administered 2016-03-12: 0.4 mg via SUBLINGUAL

## 2016-03-12 MED ORDER — DIMENHYDRINATE 50 MG PO TABS
25.0000 mg | ORAL_TABLET | Freq: Four times a day (QID) | ORAL | Status: DC | PRN
  Administered 2016-03-12 – 2016-03-13 (×2): 25 mg via ORAL
  Filled 2016-03-12 (×3): qty 1

## 2016-03-12 MED ORDER — NITROGLYCERIN 0.4 MG SL SUBL
SUBLINGUAL_TABLET | SUBLINGUAL | Status: AC
  Administered 2016-03-12: 0.4 mg via SUBLINGUAL
  Filled 2016-03-12: qty 1

## 2016-03-12 NOTE — Progress Notes (Signed)
78 seconds SVT noted -pt asymptomatic

## 2016-03-12 NOTE — Progress Notes (Signed)
Upon assessment of pt, pt states she is having 10/10 chest pain. Pt is diaphoretic, tachycardic with HR up to 140s, increased RR up 30s and using accessory muscles to breathe. Her O2 sat was >95% on 2L [place] now dropping to low 90s. Increased O2 to 4L [place].  EKG was done, SL NTG given, along with prn pain and anxiety medication. Cardiology paged and made aware. New orders received and followed. MD to come to bedside. Will continue to monitor closely.

## 2016-03-12 NOTE — Progress Notes (Signed)
TELEMETRY: Reviewed telemetry pt in sinus tachycardia with rate 130. Earlier this am had an episode of SVT.   Filed Vitals:   03/12/16 0500 03/12/16 0600 03/12/16 0700 03/12/16 0825  BP: 93/64 82/54 83/58    Pulse: 91 82 84   Temp:    98.5 F (36.9 C)  TempSrc:    Oral  Resp: 18 16 16    Height:      Weight:      SpO2: 97% 96% 96%     Intake/Output Summary (Last 24 hours) at 03/12/16 0835 Last data filed at 03/12/16 0400  Gross per 24 hour  Intake    340 ml  Output    900 ml  Net   -560 ml   Filed Weights   03/09/16 0500 03/10/16 0451 03/12/16 0441  Weight: 59.4 kg (130 lb 15.3 oz) 60.6 kg (133 lb 9.6 oz) 60.4 kg (133 lb 2.5 oz)    Subjective Complains of increased chest pain and dyspnea this am. Nauseated. Received sl Ntg and IV dilaudid with only mild relief.   Marland Kitchen arformoterol  15 mcg Nebulization BID  . aspirin EC  81 mg Oral Daily  . atorvastatin  80 mg Oral q1800  . budesonide (PULMICORT) nebulizer solution  0.5 mg Nebulization BID  . clopidogrel  75 mg Oral Daily  . levofloxacin  750 mg Oral Q48H  . loratadine  10 mg Oral q morning - 10a  . metoCLOPramide (REGLAN) injection  5 mg Intravenous Q12H  . metoprolol tartrate  12.5 mg Oral BID  . pantoprazole (PROTONIX) IV  40 mg Intravenous Daily  . senna  1 tablet Oral QHS  . tiZANidine  2 mg Oral TID      LABS: Basic Metabolic Panel:  Recent Labs  03/11/16 0504 03/12/16 0500  NA 138 136  K 4.3 4.6  CL 100* 100*  CO2 28 26  GLUCOSE 104* 83  BUN 20 20  CREATININE 1.40* 1.45*  CALCIUM 9.2 8.9  MG 1.8 2.0  PHOS 4.0 4.8*   Liver Function Tests: No results for input(s): AST, ALT, ALKPHOS, BILITOT, PROT, ALBUMIN in the last 72 hours. No results for input(s): LIPASE, AMYLASE in the last 72 hours. CBC:  Recent Labs  03/10/16 0335 03/11/16 0504  WBC 15.5* 10.2  HGB 6.7* 7.7*  HCT 22.8* 25.3*  MCV 89.1 88.2  PLT 305 249   Cardiac Enzymes: No results for input(s): CKTOTAL, CKMB, CKMBINDEX,  TROPONINI in the last 72 hours. BNP: No results for input(s): PROBNP in the last 72 hours. D-Dimer: No results for input(s): DDIMER in the last 72 hours. Hemoglobin A1C: No results for input(s): HGBA1C in the last 72 hours. Fasting Lipid Panel: No results for input(s): CHOL, HDL, LDLCALC, TRIG, CHOLHDL, LDLDIRECT in the last 72 hours. Thyroid Function Tests: No results for input(s): TSH, T4TOTAL, T3FREE, THYROIDAB in the last 72 hours.  Invalid input(s): FREET3   Radiology/Studies:  Dg Chest Port 1 View  03/12/2016  CLINICAL DATA:  Pneumothorax. EXAM: PORTABLE CHEST 1 VIEW COMPARISON:  March 11, 2016. FINDINGS: Stable cardiomegaly. No pneumothorax is noted. Right subclavian catheter is unchanged in position. Stable right lung opacities are noted concerning for edema or inflammation. Stable bilateral pleural effusions are noted. Bony thorax is unremarkable. IMPRESSION: No pneumothorax seen. Stable bilateral pleural effusions. Stable right lung opacities. Electronically Signed   By: Marijo Conception, M.D.   On: 03/12/2016 07:25   Dg Chest Port 1 View  03/11/2016  CLINICAL DATA:  Pneumothorax.  Central chest pain. History of hypertension and congestive heart failure. Chest tube removal. EXAM: PORTABLE CHEST 1 VIEW COMPARISON:  Earlier same day FINDINGS: Right-sided chest tube is been removed. Right subclavian central line is unchanged with its tip at the SVC RA junction. Cardiomegaly persists with left ventricular prominence. Bilateral effusions with atelectasis in the lower lungs, left more than right. Interstitial edema, right more than left. No pneumothorax. IMPRESSION: Right chest tube removed. No pneumothorax. Persistent effusions and lower lobe atelectasis. Persistent interstitial edema. Electronically Signed   By: Nelson Chimes M.D.   On: 03/11/2016 12:52   Dg Chest Port 1 View  03/11/2016  CLINICAL DATA:  Recent pneumothorax.  Difficulty breathing EXAM: PORTABLE CHEST 1 VIEW COMPARISON:  March 10, 2016 FINDINGS: There is a chest tube on the right, unchanged in position. Central catheter tip is in the superior vena cava. No pneumothorax. There are pleural effusions bilaterally with interstitial edema and cardiomegaly. There is pulmonary venous hypertension. There is atelectatic change in the bases, stable. There is postoperative change involving the left humerus. Patient has undergone kyphoplasty procedures at several levels, unchanged. IMPRESSION: No pneumothorax. Evidence of a degree of congestive heart failure. Bibasilar atelectasis persists. Electronically Signed   By: Lowella Grip III M.D.   On: 03/11/2016 07:55   Ecg initially showed global ST depression and ST elevation in AVr. Repeat Ecg showed improvement in ST depression.  Echo: Study Conclusions  - Left ventricle: The cavity size was normal. Wall thickness was  normal. Systolic function was moderately to severely reduced. The  estimated ejection fraction was in the range of 30% to 35%.  Diffuse hypokinesis. Features are consistent with a pseudonormal  left ventricular filling pattern, with concomitant abnormal  relaxation and increased filling pressure (grade 2 diastolic  dysfunction). Doppler parameters are consistent with high  ventricular filling pressure. - Aortic valve: Valve mobility was restricted. There was severe  stenosis. There was trivial regurgitation. Valve area (VTI): 0.93  cm^2. Valve area (Vmax): 0.91 cm^2. Valve area (Vmean): 0.78  cm^2. - Mitral valve: Calcified annulus. Mildly thickened leaflets .  There was mild regurgitation. - Left atrium: The atrium was moderately dilated. - Pulmonary arteries: Systolic pressure was mildly increased. PA  peak pressure: 41 mm Hg (S).  Impressions:  - Moderate to severe global reduction in LV function; grade 2  diastolic dysfunction with elevated LV filling pressure; severely  calcified aortic valve with reduced cusp excursion; severe AS  by  continuity equation (mean gradient 14 mmHg; AVA 0.9 cm2); trace  AI; MAC with mild MR; moderate LAE; trace TR with mildly elevated  pulmonary pressure; consider dobutamine echo to further assess  severity of AS.  PHYSICAL EXAM General: Chronically ill, elderly, frail WF. Appears uncomfortable and in respiratory distress.  Head: Normal Neck: Negative for carotid bruits. JVD not elevated. No adenopathy Lungs: increased work of breathing. Sat 95% Heart: RRR tachy with grade 2/6 systolic murmur RUSB. Abdomen: Soft, non-tender, non-distended with normoactive bowel sounds. No hepatomegaly. No rebound/guarding. No obvious abdominal masses. Msk:  Strength and tone appears normal for age. Extremities: No clubbing, cyanosis or edema.  Distal pedal pulses are 2+ and equal bilaterally. Neuro: awake, alert. Oriented.  Responds appropriately.   ASSESSMENT AND PLAN: 1. Acute on chronic respiratory failure secondary to acute pulmonary edema +/- PNA in setting of baseline severe COPD. Prior pneumothorax- S/p right chest tube now out.   On antibiotics. Tracheal aspirate + for pseudomonas aeruginosa. Not able to push diuretics due to  hypotension. DNR.   2. NSTEMI. Troponin >65. Ecg findings c/w global ischemia in patient with known severe left main and proximal RCA disease. Seen by Dr. Roxan Hockey in June 2016 following cardiac cath. Not felt to be a surgical candidate due to severe COPD, anemia, and frailty. She has been on good medical therapy. Her quality of life has been very poor. On home oxygen and really unable to leave the house. Functional status is very poor. I reviewed films with Dr. Angelena Form. She is not a candidate  for stenting. With her coronary status alone it would be very high risk and given comorbid conditions of severe COPD, Severe AS, and severe LV dysfunction she would not survive the procedure and I think it is unlikely to improve her quality of life.  I would recommend palliative  care. On ASA and Plavix. On very low dose metoprolol. Unable to tolerate additional antianginal therapy due to low BP.   3. Severe COPD.   4. Acute on chronic Combined systolic and diastolic CHF. EF dropped to 30-35% with recent MI.   Additional medication limited by low BP.  5. NSVT and SVT due to ischemia, infarct. Off amiodarone now. Continue low dose metoprolol. Potassium repleted.   6. Acute on chronic RF.   7. Hyperlipidemia on statin.   8. Hypokalemia- repleted  9. Right pneumothorax. CT in place  10. Severe AS  Plan: Palliative care on board.  Little else to offer that will treat her underlying illness. Antianginal therapy limited by hypotension. Will give narcotics as needed for pain.Prognosis is very poor. Focus on comfort care. I think we are rapidly approaching a terminal event- may need to consider IV narcotic drip and inpatient Hospice.   Present on Admission:  . Respiratory failure ([place]) . Acute on chronic respiratory failure ([place]) . Acute on chronic systolic (congestive) heart failure ([place]) . NSTEMI (non-ST elevated myocardial infarction) ([place])  Signed, Laytoya Ion [place], [place] 03/12/2016 8:35 AM

## 2016-03-12 NOTE — Progress Notes (Signed)
BP Systolic in the upper [id number] MAP in the 60s HR in the 80s w/ few PVCs-Pt asymptomatic Dr Claiborne Billings made aware.Pt. Got lopressor last night.

## 2016-03-12 NOTE — Consult Note (Signed)
[facility] referral from [facility]. Chart reviewed and spoke with CSW Raquel Sarna and [facility] Elissa Hefty before engaging with patient. Made CSW Raquel Sarna aware [facility] is full at the moment. Attempted to engage with patient but she said "I don't feel like talking, I just feel too bad" and "I have talked too much today." She apologized and agreed to attempt a visit tomorrow. Spoke with bedside RN to make her aware of attempted visit. [facility] weekend liaison will follow up tomorrow if room becomes available.   Thank you. Erling Conte, Anon [place]

## 2016-03-12 NOTE — Progress Notes (Signed)
PT Cancellation Note  Patient Details Name: KYNDLE MEGGITT MRN: [id number] DOB: 1934-09-23   Cancelled Treatment:    Reason Eval/Treat Not Completed: Other (comment);Medical issues which prohibited therapy (per nsg hold, active chest pain)   Duncan Dull 03/12/2016, 10:47 AM Alben Deeds, PT DPT  [phone]

## 2016-03-12 NOTE — Progress Notes (Signed)
[name] continues to have severe intractable chest pain and nausea. Continues to have issues with SVT and VT runs. She remains extremely fragile. I continued our conversation from yesterday about her condition and about hospice care-transition to comfort care. She clearly has reservations about hospice but I have helped her with perspective and also strongly recommended a hospice facility for her care-we have really offered her all possible medical interventions and currently are providing mostly symptom management and a hospice facility could provide this level of care and support. She will most certainly not be able to rehab and home is not an option.   She is clearly grieving her condition and teh lack of control over what is happening to her- she says "this is just so sad". She tells me that her husband was a family doctor in [place] and that she had 5 children, only one is still living and he has severe alcoholism. She has twin girls who died of overdoses years apart, a son who died in a car accident and one who died of lung disease. She has a great deal of existential suffering and almost no support at bedside or available. She reports her brother has HCPOA but is in bad health and he wants to change that to her grandson [name] because she cannot depend on her son to care for her. She is mentally very sharp -she doesn't want to "talk about dying" but "knows this is what is happening". Her family struggle with addiction may explain her hesitatation to accept opiates.  She is agreeable to a hospice facility for her care-I sensed more withdrawal towards the end of our conversation in terms of her coping and processing her condition. She tells me she doesn't want to suffer and is fine going to a hospice facility if they can help with that-I provided reassurance.  Will ask CSW to place a referral. Her porgnosis is likely very short-she complete Levaquin dose tomorrow and has a draining chest  tube insertion site, has been having VT and chest pain- her death could be sudden or a progression over the next 2 weeks- but I doubt she will survive much longer than that.  I offered to speak with her family but she declined this opportunity.  Increased her Ativan for Anxiety Agree with Hydromorphone IV PRN for Acute chest pain and comfort Stopped some of her unnecessary medications.  Time: 10:45-11:45 Total 50 minutes Greater than 50%  of this time was spent counseling and coordinating care related to the above assessment and plan.   Lane Hacker, DO Palliative Medicine [phone]

## 2016-03-12 NOTE — Progress Notes (Addendum)
PULMONARY / CRITICAL CARE MEDICINE   Name: [name] MRN: [id number] DOB: Aug 28, 1934    ADMISSION DATE:  03/11/2016  REFERRING MD:  [place]   CHIEF COMPLAINT:  Acute respiratory failure   SUBJECTIVE:  C/o feeling short of breath and pressure in her chest.  VITAL SIGNS: BP 108/80 mmHg  Pulse 110  Temp(Src) 98.1 F (36.7 C) (Oral)  Resp 15  Ht 5' (1.524 m)  Wt 133 lb 2.5 oz (60.4 kg)  BMI 26.01 kg/m2  SpO2 95%  INTAKE / OUTPUT: I/O last 3 completed shifts: In: 41 [P.O.:330; I.V.:160] Out: 2200 [Urine:2200]  PHYSICAL EXAMINATION: General: frail Neuro: alert, follows commands HEENT: no stridor Cardiac: regular, 3/6 SM Chest: basilar crackles, Rt chest tube site dressing dry Abd: soft, non tender Ext: 1+ edema Skin: no rashes  LABS:  BMET  Recent Labs Lab 03/10/16 0335 03/11/16 0504 03/12/16 0500  NA 137 138 136  K 4.4 4.3 4.6  CL 99* 100* 100*  CO2 28 28 26   BUN 22* 20 20  CREATININE 1.52* 1.40* 1.45*  GLUCOSE 95 104* 83    Electrolytes  Recent Labs Lab 03/08/16 0439 03/09/16 0358  03/10/16 0335 03/11/16 0504 03/12/16 0500  CALCIUM 8.6* 8.4*  < > 8.2* 9.2 8.9  MG 2.0 1.8  --   --  1.8 2.0  PHOS 3.3  --   --   --  4.0 4.8*  < > = values in this interval not displayed.  CBC  Recent Labs Lab 03/09/16 0358 03/10/16 0335 03/11/16 0504  WBC 17.9* 15.5* 10.2  HGB 8.2* 6.7* 7.7*  HCT 28.0* 22.8* 25.3*  PLT 312 305 249    Coag's  Recent Labs Lab 03/22/2016 0950 03/23/2016 1346  APTT  --  107*  INR 1.18 1.31    Sepsis Markers  Recent Labs Lab 03/08/2016 1010 03/06/2016 1346 04/04/2016 1348 03/08/16 0439 03/09/16 0358  LATICACIDVEN 1.69  --  2.05*  --   --   PROCALCITON  --  <0.10  --  0.23 0.15    ABG  Recent Labs Lab 03/11/2016 1408 03/08/16 0325 03/09/16 0330  PHART 7.108* 7.422 7.552*  PCO2ART 52.5* 30.6* 35.8  PO2ART 87.0 149* 74.0*    Liver Enzymes  Recent Labs Lab 03/06/2016 0950  AST 40  ALT 13*  ALKPHOS  74  BILITOT 0.4  ALBUMIN 3.3*    Cardiac Enzymes  Recent Labs Lab 04/01/2016 1346 03/15/2016 2011 03/14/2016 2348  TROPONINI 0.26* 30.66* >65.00*    Glucose  Recent Labs Lab 03/10/16 1611 03/10/16 1937 03/11/16 0004 03/11/16 0414 03/11/16 0851 03/11/16 1225  GLUCAP 94 113* 92 100* 92 84    Imaging Dg Chest Port 1 View  03/12/2016  CLINICAL DATA:  Pneumothorax. EXAM: PORTABLE CHEST 1 VIEW COMPARISON:  March 11, 2016. FINDINGS: Stable cardiomegaly. No pneumothorax is noted. Right subclavian catheter is unchanged in position. Stable right lung opacities are noted concerning for edema or inflammation. Stable bilateral pleural effusions are noted. Bony thorax is unremarkable. IMPRESSION: No pneumothorax seen. Stable bilateral pleural effusions. Stable right lung opacities. Electronically Signed   By: Marijo Conception, M.D.   On: 03/12/2016 07:25     STUDIES:  Echo 4/3>>> severe AS, decrease in her LV fxn to [id number], grade 2 diastolic dysfxn  CULTURES: BCx2 4/2>>> Respiratory culture 4/2>>> pseudomonas (pan-sens)  ANTIBIOTICS: levaquin 4/2>>> azactam 4/2>>> 4/5  SIGNIFICANT EVENTS: 4/02 Admit, cardiology consulted, Rt pneumothorax 4/05 Transfuse 1 unit PRBC; DNR status 4/06 Palliative care consulted; transfer  to SDU  LINES/TUBES: ETT 4/2>>> 4/4 Rt IJ CVL 4/2>>> Rt chest tube 4/2>>>4/6  DISCUSSION: 82 yo female presented with dyspnea, chest pain, weakness.  She required intubation for hypoxic/hypercapnic respiratory failure.  She has hx of COPD on 4 liters home oxygen, CAD with ischemic CM.  Chronic resp failure d/t COPD and CAD / ischemic CM on 4 liters baseline.  Admitted with CP and acute resp failure, NSTEMI, B infiltrates consistent w acute CHF. Intubated and treated medically for her MI + empiric abx in case of possible PNA  ASSESSMENT / PLAN:  Acute on chronic hypoxic/hypercapnic respiratory failure. Acute pulmonary edema. Pseudomonal pneumonia. Iatrogenic PTX on  Rt >> resolved. COPD. - oxygen to keep SpO2 88 to 95% - day 6/7 levaquin - brovana, pulmicort, prn duoneb  NSTEMI >> coronary interventions not an option at this point. CAD with ischemic CM. Acute on chronic combined CHF. Severe AS. SVT. Hx of HLD. Septic/cardiogenic shock >> resolved. - ASA, lipitor, plavix, lopressor  AKI. Hypokalemia >> improved. - monitor urine outpt  Nausea. Protein calorie malnutrition. - reglan, zofran, protonix - regular diet  Anemia of critical illness, and chronic disease. - f/u CBC intermittently  DM type II. - monitor blood sugar on BMET  Acute metabolic encephalopathy >> improved. Pain control, anxiety. - prn tylenol, dilaudid, ativan  Goals of Care >> DNR.  Palliative care consulted >> likely shift to comfort measures is best option, and being assessed for hospice care.  Difficult family dynamics >> has 1 living son who is alcoholic.  D/w Dr. Hilma Favors.  Will ask [facility] to assume care from 4/08 and PCCM off.   Chesley Mires, MD [facility] Pulmonary/Critical Care 03/12/2016, 2:00 PM Pager:  [phone] After 3pm call: [phone]

## 2016-03-12 NOTE — Progress Notes (Signed)
[place] Progress Note Patient Name: MARLEAN SNOWBERGER DOB: [birth year] MRN: [id number]   Date of Service  03/12/2016  HPI/Events of Note  Nausea - No relief with Zofran. Takes Dramamine at [employer].   eICU Interventions  Will order Dramamine 25 mg PO Q 6 hours PRN.      Intervention Category Intermediate Interventions: Other:  Lysle Dingwall 03/12/2016, 8:53 PM

## 2016-03-13 DIAGNOSIS — R570 Cardiogenic shock: Secondary | ICD-10-CM | POA: Diagnosis present

## 2016-03-13 DIAGNOSIS — I255 Ischemic cardiomyopathy: Secondary | ICD-10-CM | POA: Diagnosis present

## 2016-03-13 DIAGNOSIS — A419 Sepsis, unspecified organism: Secondary | ICD-10-CM

## 2016-03-13 DIAGNOSIS — R6521 Severe sepsis with septic shock: Secondary | ICD-10-CM

## 2016-03-13 DIAGNOSIS — J9601 Acute respiratory failure with hypoxia: Secondary | ICD-10-CM | POA: Diagnosis present

## 2016-03-13 LAB — CBC WITH DIFFERENTIAL/PLATELET
BASOS PCT: 0 %
Basophils Absolute: 0 10*3/uL (ref 0.0–0.1)
EOS ABS: 0.1 10*3/uL (ref 0.0–0.7)
EOS PCT: 1 %
HCT: 26.8 % — ABNORMAL LOW (ref 36.0–46.0)
HEMOGLOBIN: 7.9 g/dL — AB (ref 12.0–15.0)
LYMPHS ABS: 0.8 10*3/uL (ref 0.7–4.0)
Lymphocytes Relative: 7 %
MCH: 26.2 pg (ref 26.0–34.0)
MCHC: 29.5 g/dL — ABNORMAL LOW (ref 30.0–36.0)
MCV: 88.7 fL (ref 78.0–100.0)
MONOS PCT: 13 %
Monocytes Absolute: 1.5 10*3/uL — ABNORMAL HIGH (ref 0.1–1.0)
NEUTROS PCT: 79 %
Neutro Abs: 9.1 10*3/uL — ABNORMAL HIGH (ref 1.7–7.7)
PLATELETS: 302 10*3/uL (ref 150–400)
RBC: 3.02 MIL/uL — ABNORMAL LOW (ref 3.87–5.11)
RDW: 17 % — AB (ref 11.5–15.5)
WBC: 11.5 10*3/uL — AB (ref 4.0–10.5)

## 2016-03-13 LAB — MAGNESIUM: Magnesium: 2.2 mg/dL (ref 1.7–2.4)

## 2016-03-13 MED ORDER — SODIUM CHLORIDE 0.9% FLUSH
10.0000 mL | Freq: Two times a day (BID) | INTRAVENOUS | Status: DC
  Administered 2016-03-13 – 2016-03-15 (×4): 10 mL

## 2016-03-13 MED ORDER — HYDROXYZINE HCL 10 MG PO TABS
10.0000 mg | ORAL_TABLET | Freq: Three times a day (TID) | ORAL | Status: DC | PRN
  Administered 2016-03-13 – 2016-03-15 (×6): 10 mg via ORAL
  Filled 2016-03-13 (×6): qty 1

## 2016-03-13 MED ORDER — GUAIFENESIN 100 MG/5ML PO SOLN
5.0000 mL | ORAL | Status: DC | PRN
  Administered 2016-03-13 – 2016-03-15 (×5): 100 mg via ORAL
  Filled 2016-03-13 (×6): qty 5

## 2016-03-13 MED ORDER — SODIUM CHLORIDE 0.9% FLUSH
10.0000 mL | INTRAVENOUS | Status: DC | PRN
Start: 2016-03-13 — End: 2016-03-16

## 2016-03-13 MED ORDER — ONDANSETRON HCL 4 MG/2ML IJ SOLN
4.0000 mg | Freq: Four times a day (QID) | INTRAMUSCULAR | Status: DC | PRN
  Administered 2016-03-13: 4 mg via INTRAVENOUS
  Filled 2016-03-13: qty 2

## 2016-03-13 MED ORDER — GUAIFENESIN ER 600 MG PO TB12
600.0000 mg | ORAL_TABLET | Freq: Two times a day (BID) | ORAL | Status: DC
  Administered 2016-03-13 – 2016-03-15 (×6): 600 mg via ORAL
  Filled 2016-03-13 (×6): qty 1

## 2016-03-13 NOTE — Progress Notes (Signed)
No availability at [facility] today. They will check in with social worker 03/14/16.  Percell Locus Laverne Klugh LCSWA [phone]

## 2016-03-13 NOTE — Progress Notes (Signed)
[place] TEAM 1 - Stepdown/ICU TEAM Progress Note  [name] [id number] DOB: 04/05/1934 DOA: 03/29/2016 PCP: Betty Martinique, MD  Admit HPI / Brief Narrative: This is a 82 year old WF PMHx Chronic Respiratory Failure followed by Dr. 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 Sats were 88%. PT given 8 mg morphine and 100% NRB. She was obtunded by time she arrived in ER she was unresponsive and presumed hypercarbic. She was therefore intubated and PCCM asked to admit.    HPI/Subjective: 4/8 A/O 4 comfortably in bed. Continued chronic CP, positive SOB chronic  Assessment/Plan: Acute on chronic hypoxic/hypercapnic respiratory failure/Acute pulmonary edema/Pseudomonal pneumonia. -Completed course of antibiotics -Comfort care, will hopefully be discharged to [facility]  Iatrogenic PTX on Rt/ COPD -Resolved. -oxygen to keep SpO2 88 to 95% - Brovana BID -Pulmicort BID  NSTEMI  - coronary interventions not an option at this point.  CAD with ischemic cardiomyopathy/Acute on chronic combined CHF/Severe AS. -Comfort care, will hopefully be discharged to [place] shock >> resolved. - ASA, lipitor, plavix, lopressor  AKI. Hypokalemia >> improved. - monitor urine outpt  Nausea. Protein calorie malnutrition. - reglan, zofran, protonix - regular diet  Anemia of critical illness, and chronic disease. - f/u CBC intermittently  DM type II. - monitor blood sugar on BMET  Acute metabolic encephalopathy >> improved. Pain  control, anxiety. - prn tylenol, dilaudid, ativan  Goals of Care >> DNR. Palliative care consulted >> likely shift to comfort measures is best option, and being assessed for hospice care. Difficult family dynamics >> has 1 living son who is alcoholic.  D/w Dr. Hilma Favors.   Code Status: FULL Family Communication: no family present at time of exam Disposition Plan: -Comfort care, will hopefully be discharged to [facility]   Consultants: [place]  Procedure/Significant Events: Echo 4/3>>> severe AS, decrease in her LV fxn to [id number], grade 2 diastolic dysfxn [id number] Admit, cardiology consulted, Rt pneumothorax 4/05 Transfuse 1 unit PRBC; DNR status 4/06 Palliative care consulted; transfer to SDU  Culture BCx2 4/2>>> Respiratory culture 4/2>>> pseudomonas (pan-sens)   Antibiotics: levaquin 4/2>>> 4/8 azactam 4/2>>> 4/5  DVT prophylaxis:    Devices    LINES / TUBES:  ETT 4/2>>> 4/4 Rt IJ CVL 4/2>>> Rt chest tube 4/2>>>4/6    Continuous Infusions:   Objective: VITAL SIGNS: Temp: 98.5 F (36.9 C) (04/08 1948) Temp Source: Oral (04/08 1948) BP: 120/83 mmHg (04/08 1948) Pulse Rate: 102 (04/08 1948) SPO2; FIO2:   Intake/Output Summary (Last 24 hours) at 03/13/16 1954 Last data filed at 03/13/16 1700  Gross per 24 hour  Intake    900 ml  Output   1050 ml  Net   -150 ml     Exam: General: A/O 4 comfortably in bed, cachectic No acute respiratory distress Eyes: Negative headache, negative scleral hemorrhage ENT: Negative Runny nose, negative gingival bleeding, Neck:  Negative scars, masses, torticollis, lymphadenopathy, JVD Lungs: Clear to auscultation bilaterally without wheezes or crackles Cardiovascular: Regular rate and rhythm without murmur gallop or rub normal S1 and S2 Abdomen:negative abdominal pain, nondistended, positive soft, bowel sounds, no rebound, no ascites, no appreciable  mass Extremities: No significant cyanosis,  clubbing, or edema bilateral lower extremities Psychiatric:  Negative depression, negative anxiety, negative fatigue, negative mania  Neurologic:  Cranial nerves II through XII intact, tongue/uvula midline, all extremities muscle strength 5/5, sensation intact throughout, negative dysarthria, negative expressive aphasia, negative receptive aphasia.   Data Reviewed: Basic Metabolic Panel:  Recent Labs Lab 03/17/2016 1346 03/08/16 0439 03/09/16 0358 03/09/16 1610 03/10/16 0335 03/11/16 0504 03/12/16 0500 03/13/16 0930  NA  --  141 140 139 137 138 136  --   K  --  3.5 2.7* 4.1 4.4 4.3 4.6  --   CL  --  109 98* 99* 99* 100* 100*  --   CO2  --  21* 30 29 28 28 26   --   GLUCOSE  --  167* 110* 111* 95 104* 83  --   BUN  --  32* 21* 20 22* 20 20  --   CREATININE  --  1.72* 1.39* 1.35* 1.52* 1.40* 1.45*  --   CALCIUM  --  8.6* 8.4* 8.2* 8.2* 9.2 8.9  --   MG 2.3 2.0 1.8  --   --  1.8 2.0 2.2  PHOS 6.4* 3.3  --   --   --  4.0 4.8*  --    Liver Function Tests:  Recent Labs Lab 03/10/2016 0950  AST 40  ALT 13*  ALKPHOS 74  BILITOT 0.4  PROT 6.9  ALBUMIN 3.3*   No results for input(s): LIPASE, AMYLASE in the last 168 hours. No results for input(s): AMMONIA in the last 168 hours. CBC:  Recent Labs Lab 03/21/2016 0950  03/08/16 0439 03/09/16 0358 03/10/16 0335 03/11/16 0504 03/13/16 0930  WBC 16.6*  --  14.1* 17.9* 15.5* 10.2 11.5*  NEUTROABS 10.9*  --   --   --   --   --  9.1*  HGB 9.6*  < > 8.6* 8.2* 6.7* 7.7* 7.9*  HCT 32.4*  < > 28.1* 28.0* 22.8* 25.3* 26.8*  MCV 93.6  --  90.4 88.6 89.1 88.2 88.7  PLT 412*  --  290 312 305 249 302  < > = values in this interval not displayed. Cardiac Enzymes:  Recent Labs Lab 04/04/2016 0950 03/11/2016 1346 03/09/2016 2011 03/13/2016 2348  TROPONINI 0.07* 0.26* 30.66* >65.00*   BNP (last 3 results)  Recent Labs  04/29/15 1105 03/25/2016 0950  BNP 707.5* 2333.7*    ProBNP (last 3 results)  Recent Labs  05/08/15 1551  11/17/15 1516  PROBNP 469.0* 685.0*    CBG:  Recent Labs Lab 03/10/16 1937 03/11/16 0004 03/11/16 0414 03/11/16 0851 03/11/16 1225  GLUCAP 113* 92 100* 92 84    Recent Results (from the past 240 hour(s))  Culture, blood (routine x 2)     Status: None   Collection Time: 03/19/2016  1:39 PM  Result Value Ref Range Status   Specimen Description BLOOD RIGHT HAND  Final   Special Requests IN PEDIATRIC BOTTLE 4CC  Final   Culture NO GROWTH 5 DAYS  Final   Report Status 03/12/2016 FINAL  Final  MRSA PCR Screening     Status: None   Collection Time: 03/15/2016  3:17 PM  Result Value Ref Range Status   MRSA by PCR NEGATIVE NEGATIVE Final    Comment:        The GeneXpert MRSA Assay (FDA approved for NASAL specimens only), is one component of a comprehensive MRSA colonization surveillance program. It is not intended to diagnose MRSA infection  nor to guide or monitor treatment for MRSA infections.   Culture, respiratory (tracheal aspirate)     Status: None   Collection Time: 04/03/2016  3:59 PM  Result Value Ref Range Status   Specimen Description TRACHEAL ASPIRATE  Final   Special Requests NONE  Final   Gram Stain   Final    ABUNDANT WBC PRESENT, PREDOMINANTLY PMN NO SQUAMOUS EPITHELIAL CELLS SEEN ABUNDANT GRAM NEGATIVE RODS Performed at [facility]    Culture   Final    ABUNDANT PSEUDOMONAS AERUGINOSA Performed at [facility]    Report Status 03/10/2016 FINAL  Final   Organism ID, Bacteria PSEUDOMONAS AERUGINOSA  Final      Susceptibility   Pseudomonas aeruginosa - MIC*    CEFEPIME 2 SENSITIVE Sensitive     CEFTAZIDIME 4 SENSITIVE Sensitive     CIPROFLOXACIN <=0.25 SENSITIVE Sensitive     GENTAMICIN <=1 SENSITIVE Sensitive     IMIPENEM 1 SENSITIVE Sensitive     PIP/TAZO 8 SENSITIVE Sensitive     TOBRAMYCIN <=1 SENSITIVE Sensitive     * ABUNDANT PSEUDOMONAS AERUGINOSA  Culture, blood (Routine X 2) w Reflex to ID Panel     Status: None   Collection  Time: 03/23/2016  8:06 PM  Result Value Ref Range Status   Specimen Description BLOOD RIGHT HAND  Final   Special Requests IN PEDIATRIC BOTTLE 1CC  Final   Culture NO GROWTH 5 DAYS  Final   Report Status 03/12/2016 FINAL  Final     Studies:  Recent x-ray studies have been reviewed in detail by the Attending Physician  Scheduled Meds:  Scheduled Meds: . antiseptic oral rinse  7 mL Mouth Rinse BID  . arformoterol  15 mcg Nebulization BID  . aspirin EC  81 mg Oral Daily  . atorvastatin  80 mg Oral q1800  . budesonide (PULMICORT) nebulizer solution  0.5 mg Nebulization BID  . clopidogrel  75 mg Oral Daily  . guaiFENesin  600 mg Oral BID  . metoCLOPramide (REGLAN) injection  5 mg Intravenous 3 times per day  . metoprolol tartrate  12.5 mg Oral BID  . pantoprazole  40 mg Oral Q1200  . senna  1 tablet Oral QHS  . sodium chloride flush  10-40 mL Intracatheter Q12H  . tiZANidine  2 mg Oral TID    Time spent on care of this patient: 40 mins   Baker Moronta, Geraldo Docker , MD  [facility] Office  [phone] Pager [phone]  On-Call/Text Page:      [email or web address]      password [email or web address]  If 7PM-7AM, please contact night-coverage [email or web address] Password [email or web address] 03/13/2016, 7:54 PM   LOS: 6 days   Care during the described time interval was provided by me .  I have reviewed this patient's available data, including medical history, events of note, physical examination, and all test results as part of my evaluation. I have personally reviewed and interpreted all radiology studies.   Dia Crawford, MD [phone] Pager

## 2016-03-13 NOTE — Progress Notes (Signed)
eLink Physician-Brief Progress Note Patient Name: [name] DOB: [birth year] MRN: [id number]   Date of Service  03/13/2016  HPI/Events of Note  Patient requesting she start back on mucinex and have something to loosen phlegm prn.  eICU Interventions  Mucinex ordered Robitussin PRN     Intervention Category Minor Interventions: Routine modifications to care plan (e.g. PRN medications for pain, fever)  DETERDING,ELIZABETH 03/13/2016, 12:54 AM

## 2016-03-13 NOTE — Progress Notes (Signed)
eLink Physician-Brief Progress Note Patient Name: [name] DOB: May 23, 1934 MRN: [id number]   Date of Service  03/13/2016  HPI/Events of Note  Patient c/o of itching  eICU Interventions  Atarax 10 mg po TID PRN ordered     Intervention Category Minor Interventions: Routine modifications to care plan (e.g. PRN medications for pain, fever)  [place] 03/13/2016, 5:02 AM

## 2016-03-13 NOTE — Progress Notes (Signed)
Pt is now a DNR. No new cardiology recs Will sign off. Call for questions     Gesselle Fitzsimons, Wonda Cheng, MD  03/13/2016 9:30 AM    [address] [place],  [place], [place]  [address] Pager [phone] Phone: [phone]; Fax: [phone]

## 2016-03-13 NOTE — Progress Notes (Signed)
CSW left voicemail for [facility]) to call regarding bed availability.  Percell Locus Sinclair Arrazola LCSWA [phone]

## 2016-03-13 NOTE — Progress Notes (Signed)
CM received call from MD requesting we expedite facility hospice arrangements. CM called [facility] who states she is following and will call [facility] to see if bed has become available. CM relayed message to MD.  No other CM needs were communicated.

## 2016-03-14 DIAGNOSIS — J939 Pneumothorax, unspecified: Secondary | ICD-10-CM | POA: Diagnosis present

## 2016-03-14 DIAGNOSIS — J151 Pneumonia due to Pseudomonas: Secondary | ICD-10-CM

## 2016-03-14 MED ORDER — MORPHINE SULFATE 25 MG/ML IV SOLN
0.5000 mg/h | INTRAVENOUS | Status: DC
  Administered 2016-03-14: 0.5 mg/h via INTRAVENOUS
  Filled 2016-03-14: qty 10

## 2016-03-14 NOTE — Progress Notes (Signed)
[place] TEAM 1 - Stepdown/ICU TEAM Progress Note  [name] [id number] DOB: June 07, 1934 DOA: 03/13/2016 PCP: Betty Martinique, MD  Admit HPI / Brief Narrative: This is a 82 year old WF PMHx Chronic Respiratory Failure followed by Dr. 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 Sats were 88%. PT given 8 mg morphine and 100% NRB. She was obtunded by time she arrived in ER she was unresponsive and presumed hypercarbic. She was therefore intubated and PCCM asked to admit.    HPI/Subjective: 4/9 A/O 4 comfortably in bed. However Continued chronic CP, positive SOB chronic, would like to try small dose morphine pump in order not to have cycling of pain.  Assessment/Plan: Acute on chronic hypoxic/hypercapnic respiratory failure/Acute pulmonary edema/Pseudomonal pneumonia. -Completed course of antibiotics -Comfort care, will hopefully be discharged to [facility]  Iatrogenic PTX on Rt/ COPD -Resolved. -oxygen to keep SpO2 88 to 95% - Brovana BID -Pulmicort BID  NSTEMI  - coronary interventions not an option at this point.  CAD with ischemic cardiomyopathy/Acute on chronic combined CHF/Severe AS. -Morphine pump -Continue all other medications for. Pruritus/rash; has been working well -Comfort care, will hopefully be discharged to [place] shock >> resolved. - ASA, lipitor, plavix, lopressor  AKI. Hypokalemia >> improved. - monitor urine outpt  Nausea. Protein calorie malnutrition. - reglan, zofran, protonix - regular  diet  Anemia of critical illness, and chronic disease. - f/u CBC intermittently  DM type II. - monitor blood sugar on BMET  Acute metabolic encephalopathy >> improved. Pain control, anxiety. - prn tylenol, dilaudid, ativan  Goals of Care >> DNR. Palliative care consulted >> likely shift to comfort measures is best option, and being assessed for hospice care. Difficult family dynamics >> has 1 living son who is alcoholic. -4/9 spoke with NCM who spoke to [place] stated [facility] still has no beds will start working on option be on 4/10  D/w Dr. Hilma Favors.   Code Status: FULL Family Communication: no family present at time of exam Disposition Plan: -Comfort care, will hopefully be discharged to [facility]   Consultants: [place]  Procedure/Significant Events: Echo 4/3>>> severe AS, decrease in her LV fxn to [id number], grade 2 diastolic dysfxn [id number] Admit, cardiology consulted, Rt pneumothorax 4/05 Transfuse 1 unit PRBC; DNR status 4/06 Palliative care consulted; transfer to SDU  Culture BCx2 4/2>>> Respiratory culture 4/2>>> pseudomonas (pan-sens)   Antibiotics: levaquin 4/2>>> 4/8 azactam 4/2>>> 4/5  DVT prophylaxis:    Devices    LINES / TUBES:  ETT 4/2>>> 4/4 Rt IJ CVL 4/2>>> Rt chest tube 4/2>>>4/6    Continuous Infusions: . morphine      Objective: VITAL SIGNS: Temp: 98.4 F (36.9 C) (04/09 1702) Temp Source: Oral (04/09 1702) BP: 114/73 mmHg (04/09 1600) Pulse Rate: 97 (04/09 1600) SPO2; FIO2:   Intake/Output Summary (Last 24 hours) at 03/14/16 1835 Last data filed at 03/14/16 1700  Gross per 24 hour  Intake    800 ml  Output    675 ml  Net    125 ml     Exam: General: A/O 4 comfortably in bed, cachectic No acute respiratory  distress Eyes: Negative headache, negative scleral hemorrhage ENT: Negative Runny nose, negative gingival bleeding, Neck:  Negative scars, masses, torticollis, lymphadenopathy,  JVD Lungs: Clear to auscultation bilaterally without wheezes or crackles Cardiovascular: Regular rate and rhythm without murmur gallop or rub normal S1 and S2 Abdomen:negative abdominal pain, nondistended, positive soft, bowel sounds, no rebound, no ascites, no appreciable mass Extremities: No significant cyanosis, clubbing, or edema bilateral lower extremities Psychiatric:  Negative depression, negative anxiety, negative fatigue, negative mania  Neurologic:  Cranial nerves II through XII intact, tongue/uvula midline, all extremities muscle strength 5/5, sensation intact throughout, negative dysarthria, negative expressive aphasia, negative receptive aphasia.   Data Reviewed: Basic Metabolic Panel:  Recent Labs Lab 03/08/16 0439 03/09/16 0358 03/09/16 1610 03/10/16 0335 03/11/16 0504 03/12/16 0500 03/13/16 0930  NA 141 140 139 137 138 136  --   K 3.5 2.7* 4.1 4.4 4.3 4.6  --   CL 109 98* 99* 99* 100* 100*  --   CO2 21* 30 29 28 28 26   --   GLUCOSE 167* 110* 111* 95 104* 83  --   BUN 32* 21* 20 22* 20 20  --   CREATININE 1.72* 1.39* 1.35* 1.52* 1.40* 1.45*  --   CALCIUM 8.6* 8.4* 8.2* 8.2* 9.2 8.9  --   MG 2.0 1.8  --   --  1.8 2.0 2.2  PHOS 3.3  --   --   --  4.0 4.8*  --    Liver Function Tests: No results for input(s): AST, ALT, ALKPHOS, BILITOT, PROT, ALBUMIN in the last 168 hours. No results for input(s): LIPASE, AMYLASE in the last 168 hours. No results for input(s): AMMONIA in the last 168 hours. CBC:  Recent Labs Lab 03/08/16 0439 03/09/16 0358 03/10/16 0335 03/11/16 0504 03/13/16 0930  WBC 14.1* 17.9* 15.5* 10.2 11.5*  NEUTROABS  --   --   --   --  9.1*  HGB 8.6* 8.2* 6.7* 7.7* 7.9*  HCT 28.1* 28.0* 22.8* 25.3* 26.8*  MCV 90.4 88.6 89.1 88.2 88.7  PLT 290 312 305 249 302   Cardiac Enzymes:  Recent Labs Lab 03/14/2016 2011 03/28/2016 2348  TROPONINI 30.66* >65.00*   BNP (last 3 results)  Recent Labs  04/29/15 1105 03/30/2016 0950  BNP 707.5* 2333.7*     ProBNP (last 3 results)  Recent Labs  05/08/15 1551 11/17/15 1516  PROBNP 469.0* 685.0*    CBG:  Recent Labs Lab 03/10/16 1937 03/11/16 0004 03/11/16 0414 03/11/16 0851 03/11/16 1225  GLUCAP 113* 92 100* 92 84    Recent Results (from the past 240 hour(s))  Culture, blood (routine x 2)     Status: None   Collection Time: 03/28/2016  1:39 PM  Result Value Ref Range Status   Specimen Description BLOOD RIGHT HAND  Final   Special Requests IN PEDIATRIC BOTTLE 4CC  Final   Culture NO GROWTH 5 DAYS  Final   Report Status 03/12/2016 FINAL  Final  MRSA PCR Screening     Status: None   Collection Time: 03/13/2016  3:17 PM  Result Value Ref Range Status   MRSA by PCR NEGATIVE NEGATIVE Final    Comment:        The GeneXpert MRSA Assay (FDA approved for NASAL specimens only), is one component of a comprehensive MRSA colonization surveillance program. It is not intended to diagnose MRSA infection nor to guide or monitor treatment for MRSA infections.   Culture, respiratory (tracheal aspirate)     Status: None  Collection Time: 03/22/2016  3:59 PM  Result Value Ref Range Status   Specimen Description TRACHEAL ASPIRATE  Final   Special Requests NONE  Final   Gram Stain   Final    ABUNDANT WBC PRESENT, PREDOMINANTLY PMN NO SQUAMOUS EPITHELIAL CELLS SEEN ABUNDANT GRAM NEGATIVE RODS Performed at [facility]    Culture   Final    ABUNDANT PSEUDOMONAS AERUGINOSA Performed at [facility]    Report Status 03/10/2016 FINAL  Final   Organism ID, Bacteria PSEUDOMONAS AERUGINOSA  Final      Susceptibility   Pseudomonas aeruginosa - MIC*    CEFEPIME 2 SENSITIVE Sensitive     CEFTAZIDIME 4 SENSITIVE Sensitive     CIPROFLOXACIN <=0.25 SENSITIVE Sensitive     GENTAMICIN <=1 SENSITIVE Sensitive     IMIPENEM 1 SENSITIVE Sensitive     PIP/TAZO 8 SENSITIVE Sensitive     TOBRAMYCIN <=1 SENSITIVE Sensitive     * ABUNDANT PSEUDOMONAS AERUGINOSA  Culture, blood  (Routine X 2) w Reflex to ID Panel     Status: None   Collection Time: 03/23/2016  8:06 PM  Result Value Ref Range Status   Specimen Description BLOOD RIGHT HAND  Final   Special Requests IN PEDIATRIC BOTTLE 1CC  Final   Culture NO GROWTH 5 DAYS  Final   Report Status 03/12/2016 FINAL  Final     Studies:  Recent x-ray studies have been reviewed in detail by the Attending Physician  Scheduled Meds:  Scheduled Meds: . antiseptic oral rinse  7 mL Mouth Rinse BID  . arformoterol  15 mcg Nebulization BID  . aspirin EC  81 mg Oral Daily  . atorvastatin  80 mg Oral q1800  . budesonide (PULMICORT) nebulizer solution  0.5 mg Nebulization BID  . clopidogrel  75 mg Oral Daily  . guaiFENesin  600 mg Oral BID  . metoCLOPramide (REGLAN) injection  5 mg Intravenous 3 times per day  . metoprolol tartrate  12.5 mg Oral BID  . pantoprazole  40 mg Oral Q1200  . senna  1 tablet Oral QHS  . sodium chloride flush  10-40 mL Intracatheter Q12H  . tiZANidine  2 mg Oral TID    Time spent on care of this patient: 40 mins   Nestor Wieneke, Geraldo Docker , MD  [facility] Office  [phone] Pager [phone]  On-Call/Text Page:      [email or web address]      password [email or web address]  If 7PM-7AM, please contact night-coverage [email or web address] Password [email or web address] 03/14/2016, 6:35 PM   LOS: 7 days   Care during the described time interval was provided by me .  I have reviewed this patient's available data, including medical history, events of note, physical examination, and all test results as part of my evaluation. I have personally reviewed and interpreted all radiology studies.   Dia Crawford, MD [phone] Pager

## 2016-03-14 NOTE — Progress Notes (Signed)
No [facility] bed this pm.  Will have Unit CSW f/u in am and expand IP hospice bed search if necessary.  Creta Levin, LCSW Covering [id number]

## 2016-03-15 DIAGNOSIS — I255 Ischemic cardiomyopathy: Secondary | ICD-10-CM

## 2016-03-15 MED ORDER — LORAZEPAM 0.5 MG PO TABS: 0.5000 mg | ORAL_TABLET | ORAL | Status: DC | PRN

## 2016-03-15 MED ORDER — LORAZEPAM 2 MG/ML IJ SOLN: 0.5000 mg | INTRAMUSCULAR | Status: DC | PRN

## 2016-03-15 MED ORDER — GUAIFENESIN ER 600 MG PO TB12: 600.0000 mg | ORAL_TABLET | Freq: Two times a day (BID) | ORAL | Status: DC | PRN

## 2016-03-15 MED ORDER — ACETAMINOPHEN 325 MG PO TABS: 650.0000 mg | ORAL_TABLET | ORAL | Status: DC | PRN

## 2016-03-15 MED ORDER — MORPHINE BOLUS VIA INFUSION
1.0000 mg | INTRAVENOUS | Status: DC | PRN
  Administered 2016-03-16: 2 mg via INTRAVENOUS
  Filled 2016-03-15 (×2): qty 4

## 2016-03-15 MED ORDER — PROCHLORPERAZINE EDISYLATE 5 MG/ML IJ SOLN
10.0000 mg | Freq: Four times a day (QID) | INTRAMUSCULAR | Status: DC | PRN
  Filled 2016-03-15: qty 2

## 2016-03-15 MED ORDER — MORPHINE SULFATE (PF) 2 MG/ML IV SOLN
1.0000 mg | INTRAVENOUS | Status: DC | PRN
  Administered 2016-03-15 (×2): 2 mg via INTRAVENOUS
  Filled 2016-03-15: qty 1

## 2016-03-15 MED ORDER — CETYLPYRIDINIUM CHLORIDE 0.05 % MT LIQD: 7.0000 mL | OROMUCOSAL | Status: DC | PRN

## 2016-03-15 MED ORDER — TIZANIDINE HCL 2 MG PO TABS
2.0000 mg | ORAL_TABLET | Freq: Four times a day (QID) | ORAL | Status: DC | PRN
  Filled 2016-03-15: qty 1

## 2016-03-15 MED ORDER — MORPHINE SULFATE (PF) 2 MG/ML IV SOLN: 1.0000 mg | INTRAVENOUS | Status: DC | PRN

## 2016-03-15 NOTE — Progress Notes (Signed)
Patients heart rate 180-190's.  RN went in to patient's room and patient was resting comfortably.  Patient denied pain and advised RN she had no needs at this time.

## 2016-03-15 NOTE — Clinical Social Work Note (Signed)
Patient was accepted at [facility], however patient and family has refusing bed at facility reporting that patient would like to remain in [place] area. Patient and family aware that [facility] does not have any beds available at this time.   RNCM notified and plans to inform MD. CSW remains available as needed.   Glendon Axe, MSW, LCSWA [phone] 03/15/2016 3:42 PM

## 2016-03-15 NOTE — Care Management Important Message (Signed)
Important Message  Patient Details  Name: [name] MRN: [id number] Date of Birth: 29-Jul-1934   Medicare Important Message Given:  Yes    [name] 03/15/2016, 12:10 PM

## 2016-03-15 NOTE — Progress Notes (Signed)
[place] TEAM 1 - Stepdown/ICU TEAM PROGRESS NOTE  [name] [id number] DOB: [birth year] DOA: 03/20/2016 PCP: Betty Martinique, MD  Admit HPI / Brief Narrative: 82 year old F followed by Dr. Melvyn Novas for COPD on 4 L home O2, HLD, HTN, Chronic combined Systolic and Diastolic CHF, Pulmonary Hypertension, CAD, Aortic stenosis; Adenocarcinoma of gastroesophageal junction; B12 deficiency; multiple bleeding ulcers 09/01/2011, Diverticulosis, Colonic Polyps,Chronic upper Back Pain, Fibromyalgia; and kidney stoneswho called EMS w/ CC weakness, shortness of breath and CP. Sats were 88%. PT given 8 mg morphine and 100% NRB. She was obtunded by the time she arrived in ER and presumed hypercarbic. She was intubated and admitted by PCCM.   HPI/Subjective: Pt is resting comfortably in bed.  She is in no apparent respiratory distress, w/ no evidence of uncontrolled pain.    Assessment/Plan:   Acute on chronic hypoxic and hypercapnic respiratory failure / Acute pulmonary edema / Pseudomonal pneumonia. -Completed course of antibiotics -Comfort care only - to be discharged to [facility]  Iatrogenic R PTX -Resolved  Severe COPD -comfort focused care only   NSTEMI - CAD with ischemic cardiomyopathy -coronary interventions not an option - comfort focused care   Acute on chronic combined CHF  Severe AoS  Septic + cardiogenic shock resolved  AKI  Protein calorie malnutrition  Anemia of critical illness and chronic disease.  DM2  Acute metabolic encephalopathy Improved  Code Status: DNR - NO CODE BLUE  Family Communication: no family present at time of exam Disposition Plan: for transfer to [facility] when bed available   Consultants: PCCM Palliative Care   Procedures: 4/3 TTE - severe AoS - EF 30-35% - grade 2 diastolic dysfxn  Antibiotics: levaquin 4/2 > 4/8 azactam 4/2 > 4/5  DVT prophylaxis: Comfort care only   Objective: Blood pressure 120/70, pulse 94,  temperature 98.4 F (36.9 C), temperature source Oral, resp. rate 14, height 5' (1.524 m), weight 60.374 kg (133 lb 1.6 oz), SpO2 97 %.  Intake/Output Summary (Last 24 hours) at 03/15/16 0957 Last data filed at 03/15/16 0600  Gross per 24 hour  Intake    606 ml  Output    930 ml  Net   -324 ml   Exam: General: No acute respiratory distress Lungs: No wheezing  Cardiovascular: Regular rate  Abdomen: Nondistended, no ascites Extremities: No significant edema bilateral lower extremities  Data Reviewed:  Basic Metabolic Panel:  Recent Labs Lab 03/09/16 0358 03/09/16 1610 03/10/16 0335 03/11/16 0504 03/12/16 0500 03/13/16 0930  NA 140 139 137 138 136  --   K 2.7* 4.1 4.4 4.3 4.6  --   CL 98* 99* 99* 100* 100*  --   CO2 30 29 28 28 26   --   GLUCOSE 110* 111* 95 104* 83  --   BUN 21* 20 22* 20 20  --   CREATININE 1.39* 1.35* 1.52* 1.40* 1.45*  --   CALCIUM 8.4* 8.2* 8.2* 9.2 8.9  --   MG 1.8  --   --  1.8 2.0 2.2  PHOS  --   --   --  4.0 4.8*  --     CBC:  Recent Labs Lab 03/09/16 0358 03/10/16 0335 03/11/16 0504 03/13/16 0930  WBC 17.9* 15.5* 10.2 11.5*  NEUTROABS  --   --   --  9.1*  HGB 8.2* 6.7* 7.7* 7.9*  HCT 28.0* 22.8* 25.3* 26.8*  MCV 88.6 89.1 88.2 88.7  PLT 312 305 249 302   CBG:  Recent Labs Lab 03/10/16 1937 03/11/16 0004 03/11/16 0414 03/11/16 0851 03/11/16 1225  GLUCAP 113* 92 100* 92 84    Recent Results (from the past 240 hour(s))  Culture, blood (routine x 2)     Status: None   Collection Time: 03/13/2016  1:39 PM  Result Value Ref Range Status   Specimen Description BLOOD RIGHT HAND  Final   Special Requests IN PEDIATRIC BOTTLE 4CC  Final   Culture NO GROWTH 5 DAYS  Final   Report Status 03/12/2016 FINAL  Final  MRSA PCR Screening     Status: None   Collection Time: 04/03/2016  3:17 PM  Result Value Ref Range Status   MRSA by PCR NEGATIVE NEGATIVE Final    Comment:        The GeneXpert MRSA Assay (FDA approved for NASAL  specimens only), is one component of a comprehensive MRSA colonization surveillance program. It is not intended to diagnose MRSA infection nor to guide or monitor treatment for MRSA infections.   Culture, respiratory (tracheal aspirate)     Status: None   Collection Time: 03/15/2016  3:59 PM  Result Value Ref Range Status   Specimen Description TRACHEAL ASPIRATE  Final   Special Requests NONE  Final   Gram Stain   Final    ABUNDANT WBC PRESENT, PREDOMINANTLY PMN NO SQUAMOUS EPITHELIAL CELLS SEEN ABUNDANT GRAM NEGATIVE RODS Performed at [facility]    Culture   Final    ABUNDANT PSEUDOMONAS AERUGINOSA Performed at [facility]    Report Status 03/10/2016 FINAL  Final   Organism ID, Bacteria PSEUDOMONAS AERUGINOSA  Final      Susceptibility   Pseudomonas aeruginosa - MIC*    CEFEPIME 2 SENSITIVE Sensitive     CEFTAZIDIME 4 SENSITIVE Sensitive     CIPROFLOXACIN <=0.25 SENSITIVE Sensitive     GENTAMICIN <=1 SENSITIVE Sensitive     IMIPENEM 1 SENSITIVE Sensitive     PIP/TAZO 8 SENSITIVE Sensitive     TOBRAMYCIN <=1 SENSITIVE Sensitive     * ABUNDANT PSEUDOMONAS AERUGINOSA  Culture, blood (Routine X 2) w Reflex to ID Panel     Status: None   Collection Time: 04/02/2016  8:06 PM  Result Value Ref Range Status   Specimen Description BLOOD RIGHT HAND  Final   Special Requests IN PEDIATRIC BOTTLE 1CC  Final   Culture NO GROWTH 5 DAYS  Final   Report Status 03/12/2016 FINAL  Final     Studies:   Recent x-ray studies have been reviewed in detail by the Attending Physician  Scheduled Meds:  Scheduled Meds: . antiseptic oral rinse  7 mL Mouth Rinse BID  . arformoterol  15 mcg Nebulization BID  . aspirin EC  81 mg Oral Daily  . atorvastatin  80 mg Oral q1800  . budesonide (PULMICORT) nebulizer solution  0.5 mg Nebulization BID  . clopidogrel  75 mg Oral Daily  . guaiFENesin  600 mg Oral BID  . metoCLOPramide (REGLAN) injection  5 mg Intravenous 3 times per day   . metoprolol tartrate  12.5 mg Oral BID  . pantoprazole  40 mg Oral Q1200  . senna  1 tablet Oral QHS  . sodium chloride flush  10-40 mL Intracatheter Q12H  . tiZANidine  2 mg Oral TID    Time spent on care of this patient: 25 mins   [facility] T , MD   [facility] Office  [phone] Pager - Text Page per Shea Evans as per below:  On-Call/Text Page:      [email or web address]      password [email or web address]  If 7PM-7AM, please contact night-coverage [email or web address] Password [email or web address] 03/15/2016, 9:57 AM   LOS: 8 days

## 2016-03-15 NOTE — Progress Notes (Signed)
Nutrition Brief Note  Chart reviewed. Pt now transitioning to comfort care.  No further nutrition interventions warranted at this time.  Please re-consult as needed.   Arbie Blankley A. Dean Wonder, RD, LDN, CDE Pager: [phone] After hours Pager: [phone]  

## 2016-03-15 NOTE — Clinical Social Work Note (Signed)
Currently [facility] does not have any beds available. CSW contacted patient's grandson, Georgina Snell to present alternative hospice facility [facility]. Pt's grandson gave CSW permission to make referral to [facility]. Pt's grandson stated he plans to present information to family and contact CSW with updates and or concerns if any.   Referral made to [facility].   CSW remains available as needed.   Glendon Axe, MSW, LCSWA [phone] 03/15/2016 10:41 AM

## 2016-03-15 NOTE — Progress Notes (Signed)
PT Cancellation Note  Patient Details Name: [name] MRN: [id number] DOB: 1934/09/21   Cancelled Treatment:    Reason Eval/Treat Not Completed: Other (comment). Pt now transitioned to DNR and comfort care. Pt currently on morphine drip. Pt hoping to transfer to [facility]. Pt with no further acute PT needs at this time. PT signing off.   Kingsley Callander 03/15/2016, 9:20 AM   Kittie Plater, PT, DPT Pager #: [phone] Office #: [phone]

## 2016-04-05 NOTE — Clinical Social Work Note (Signed)
Patient expired. Clinical Social Worker will sign off as social work intervention is no longer needed.  Glendon Axe, MSW, LCSWA [phone] 2016-04-12 12:50 PM

## 2016-04-05 NOTE — Discharge Summary (Addendum)
Death Summary  [name] [id number] DOB: 07-19-1934 DOA: 03-10-16  PCP: Betty Martinique, MD  Admit date: 03/10/2016 Date of Death: 03-20-2016  Final Diagnoses:  Acute on chronic hypoxic and hypercapnic respiratory failure / Acute pulmonary edema / Pseudomonal pneumonia. Iatrogenic R PTX Severe COPD NSTEMI - CAD with ischemic cardiomyopathy Acute on chronic combined CHF Severe AoS Cardiogenic shock Possible Sepsis - unable to definitively determine  AKI Protein calorie malnutrition Anemia of critical illness and chronic disease. DM2 Acute metabolic encephalopathy  History of present illness:  82 year old F followed by Dr. Melvyn Novas for COPD on 4 L home O2, HLD, HTN, Chronic combined Systolic and Diastolic CHF, Pulmonary Hypertension, CAD, Aortic stenosis; Adenocarcinoma of gastroesophageal junction; B12 deficiency; multiple bleeding ulcers 09/01/2011, Diverticulosis, Colonic Polyps,Chronic upper Back Pain, Fibromyalgia; and kidney stoneswho called EMS w/ CC weakness, shortness of breath and CP. Sats were 88%. PT given 8 mg morphine and 100% NRB. She was obtunded by the time she arrived in ER and presumed hypercarbic. She was intubated and admitted by [facility].   Hospital Course:  The majority of the care provided to [name] was during her stay in the ICU, on the PCCM service.  After initially being intubated in the ED, she was able to be weaned from the vent and extubated on 4/4.  After discussing her condition w/ the PCCM team, she made it clear she desired NCB status.  Palliative Care was consulted, and the pt ultimately decided she desired full comfort care only.  Attempts were being made to place her in a residential hospice home.  The pt died while still in the hospital.  Time of death 06:00 03/19/16.    SignedCherene Altes  [facility] March 20, 2016, 6:48 PM

## 2016-04-05 NOTE — Progress Notes (Addendum)
Two RN's, [facility], confirmed no auscultation of heart sounds for greater than 1 minute. Patient was comfortable and RN present when patient passed.  Previous attempt contact family member made when patient's heart rate declined.  Called number listed on patient's visitor contract for [employer], informed of patient's passing.  Chrystal advised she will contact family members and will come to the hospital to see patient and provide funeral home information.   Paged NP Baltazar Najjar to inform of patient's passing.

## 2016-04-05 NOTE — Progress Notes (Signed)
Wasted 200 mg of morphine in sink witnessed by Kathrine Haddock.

## 2016-04-05 NOTE — Progress Notes (Signed)
Pt expired at 0600 pronounced by 2 RNs. Pt was a DNR and also comfort care. Death certificate completed and given to RN, Lujean Rave. No family at bedside. KJKG, NP [facility]

## 2016-04-05 NOTE — Progress Notes (Signed)
Patient's heart rate dropped in to the 30-60's.  Called and left voicemail for Mittie Bodo requesting family call the unit.

## 2016-04-05 DEATH — deceased
